# Patient Record
Sex: Female | Born: 1955
Health system: Southern US, Community
[De-identification: ages and names within clinical notes are randomized; demographics above are authoritative.]

## PROBLEM LIST (undated history)

## (undated) DIAGNOSIS — E119 Type 2 diabetes mellitus without complications: Secondary | ICD-10-CM

## (undated) DIAGNOSIS — F329 Major depressive disorder, single episode, unspecified: Secondary | ICD-10-CM

## (undated) DIAGNOSIS — I639 Cerebral infarction, unspecified: Secondary | ICD-10-CM

## (undated) DIAGNOSIS — Q249 Congenital malformation of heart, unspecified: Secondary | ICD-10-CM

## (undated) DIAGNOSIS — E7800 Pure hypercholesterolemia, unspecified: Secondary | ICD-10-CM

## (undated) DIAGNOSIS — F32A Depression, unspecified: Secondary | ICD-10-CM

## (undated) DIAGNOSIS — I10 Essential (primary) hypertension: Secondary | ICD-10-CM

## (undated) DIAGNOSIS — F410 Panic disorder [episodic paroxysmal anxiety] without agoraphobia: Secondary | ICD-10-CM

## (undated) HISTORY — DX: Congenital malformation of heart, unspecified: Q24.9

## (undated) HISTORY — PX: WISDOM TOOTH EXTRACTION: SHX21

## (undated) HISTORY — DX: Type 2 diabetes mellitus without complications: E11.9

## (undated) HISTORY — DX: Pure hypercholesterolemia, unspecified: E78.00

## (undated) HISTORY — PX: DILATION AND EVACUATION: SHX1459

---

## 2009-12-26 HISTORY — PX: CARDIAC SURGERY: SHX584

## 2012-04-09 ENCOUNTER — Emergency Department (HOSPITAL_COMMUNITY)
Admission: EM | Admit: 2012-04-09 | Discharge: 2012-04-09 | Disposition: A | Discharge status: Home / Self Care | Payer: Self-pay | Source: Home / Self Care | Attending: Emergency Medicine | Admitting: Emergency Medicine

## 2012-04-09 ENCOUNTER — Encounter (HOSPITAL_COMMUNITY): Payer: Self-pay | Admitting: Emergency Medicine

## 2012-04-09 DIAGNOSIS — F410 Panic disorder [episodic paroxysmal anxiety] without agoraphobia: Secondary | ICD-10-CM

## 2012-04-09 HISTORY — DX: Essential (primary) hypertension: I10

## 2012-04-09 HISTORY — DX: Major depressive disorder, single episode, unspecified: F32.9

## 2012-04-09 HISTORY — DX: Depression, unspecified: F32.A

## 2012-04-09 HISTORY — DX: Panic disorder (episodic paroxysmal anxiety): F41.0

## 2012-04-09 MED ORDER — HYDROXYZINE HCL 25 MG PO TABS: 25.0000 mg | ORAL_TABLET | Freq: Three times a day (TID) | ORAL | Status: AC | PRN

## 2012-04-09 NOTE — ED Provider Notes (Signed)
Chief Complaint  Patient presents with  . Medication Refill  . Panic Attack    History of Present Illness:  Renee Griffin is a 56 year old female with a long history of panic attacks. She was being treated by a Dr. in [place] with alprazolam 1 mg 3 times daily. Her last medication refill was March 23rd. She is out of her Xanax completely right now. She is requesting a refill. She is on multiple medications, she does not know the names of any of them. She has had valvular heart disease with a porcine valve replacement and high blood pressure. She denies any suicidal ideation or homicidal ideation. No depression or crying spells. She denies any shortness of breath, chest pain or tightness, racing of the heart, or paresthesias.  Review of Systems:  Other than noted above, the patient denies any of the following symptoms: Systemic:  No fever, chills, sweats, fatigue, weight loss or gain. Resp:  No shortness of breath. Cardiovasc:  No chest pain, tightness, pressure, palpitations, dizziness, or syncope. GI:  No abdominal pain, nausea, vomiting, anorexia, diarrhea, or constipation. Neuro:  No headache, paresthesias, tremor, or muscle weakness. Psych:  No sadness, depression, crying, anxiety, panic, sleep disturbance, or suicidal or homicidal ideation.  No hallucinations or delusions.  [place]:  Past medical history, family history, social history, meds, and allergies were reviewed.  Physical Exam:   Vital signs:  BP 196/96  Pulse 85  Temp(Src) 97.9 F (36.6 C) (Oral)  Resp 20  SpO2 100% Gen:  Alert, oriented, in no distress. Lungs:  No respiratory distress.  Breath sounds clear and equal bilaterally.  No wheezes, rales, or rhonchi. Heart:  Regular rthythm.  No gallops, murmers, clicks or rubs. Abdomen:  Soft, flat and nontender.  No organomegaly or mass. Neuro:  Alert and oriented times 3. Speech clear, fluent and appropriate.  Cranial nerves intact.  No focal weakness. Psych:  Mood and affect normal.   Speech pattern normal.  Thought content normal with no suicidal or homicidal ideation.  No paranoia, hallucinations, or delusions.  Memory, insight, and judgement normal.  Assessment:  The encounter diagnosis was Panic attacks. She is too early on her refills for her alprazolam. I did check the [facility] and there were no other records on her. I told her I could give her a hydroxyzine for symptomatic relief and she could come back in a week for a reevaluation. I urged her to find a primary care doctor or a psychiatrist as soon as possible to refill this medication as we would not continue to refill it here at the urgent care Center.  Plan:   1.  The following meds were prescribed:   New Prescriptions   HYDROXYZINE (ATARAX/VISTARIL) 25 MG TABLET    Take 1 tablet (25 mg total) by mouth every 8 (eight) hours as needed for itching.   2.  The patient was instructed in symptomatic care and handouts were given. 3.  The patient was told to return if becoming worse in any way, if no better in 3 or 4 days, and given some red flag symptoms that would indicate earlier return.    Harden Mo, MD 04/09/12 2230

## 2012-04-09 NOTE — Discharge Instructions (Signed)

## 2012-04-09 NOTE — ED Notes (Signed)
PT HERE FOR RX REFILL ALPRAZOLAM FOR HX DEPRESSION,PANIC ATTACK,ANXIETY.PT WAS PRESCRIBED OVER THE PHONE BY PCP IN [place] 90 TABLETS ON 03/16/12.PT UNSURE OF HOW PILLS ARE FINISHED?? VERY TEARFUL AND NOT FOCUSED WHEN ASKED.BP ELEVATED.PT STATES SHE TAKING X 3 DIFFERENT BP MEDS DAILY

## 2012-04-16 ENCOUNTER — Encounter (HOSPITAL_COMMUNITY): Payer: Self-pay

## 2012-04-16 ENCOUNTER — Emergency Department (INDEPENDENT_AMBULATORY_CARE_PROVIDER_SITE_OTHER)
Admission: EM | Admit: 2012-04-16 | Discharge: 2012-04-16 | Disposition: A | Discharge status: Home / Self Care | Payer: Self-pay | Source: Home / Self Care | Attending: Emergency Medicine | Admitting: Emergency Medicine

## 2012-04-16 DIAGNOSIS — F419 Anxiety disorder, unspecified: Secondary | ICD-10-CM

## 2012-04-16 DIAGNOSIS — I38 Endocarditis, valve unspecified: Secondary | ICD-10-CM

## 2012-04-16 DIAGNOSIS — F411 Generalized anxiety disorder: Secondary | ICD-10-CM

## 2012-04-16 MED ORDER — BUSPIRONE HCL 15 MG PO TABS: 15.0000 mg | ORAL_TABLET | Freq: Three times a day (TID) | ORAL | Status: DC

## 2012-04-16 MED ORDER — CLOPIDOGREL BISULFATE 75 MG PO TABS: 75.0000 mg | ORAL_TABLET | Freq: Every day | ORAL | Status: DC

## 2012-04-16 MED ORDER — ALPRAZOLAM 1 MG PO TABS: 1.0000 mg | ORAL_TABLET | Freq: Every evening | ORAL | Status: DC | PRN

## 2012-04-16 NOTE — Discharge Instructions (Signed)

## 2012-04-16 NOTE — ED Notes (Signed)
Needs refills for her nerves

## 2012-04-16 NOTE — ED Provider Notes (Signed)
Chief Complaint  Patient presents with  . Medication Refill    History of Present Illness:  Renee Griffin is a 56 year old female who was seen here on April 15. At that time she wanted a refill on her Xanax. She was being treated by a doctor in [place] with a basal 1 mg 3 times daily for anxiety. Her last medication refills on March 23, so refill is denied. She is on multiple medications, but she does not have the names of any of them. She has had no other heart disease with a porcine valve replacement and high blood pressure. She was sent home on hydroxyzine. She been able to do fairly well since then. She has an appointment with Dr. Mickey Farber at the end of May, but she feels she needs something to last until then. She denies depression, suicidal ideation, or homicidal ideation. No crying spells, shortness of breath, chest pain, tightness, racing of her heart, or paresthesias. She also wanted a refill on her Plavix which she takes because of her porcine valve.  Review of Systems:  Other than noted above, the patient denies any of the following symptoms: Systemic:  No fever, chills, sweats, fatigue, weight loss or gain. Resp:  No shortness of breath. Cardiovasc:  No chest pain, tightness, pressure, palpitations, dizziness, or syncope. GI:  No abdominal pain, nausea, vomiting, anorexia, diarrhea, or constipation. Neuro:  No headache, paresthesias, tremor, or muscle weakness. Psych:  No sadness, depression, crying, anxiety, panic, sleep disturbance, or suicidal or homicidal ideation.  No hallucinations or delusions.  [place]:  Past medical history, family history, social history, meds, and allergies were reviewed.  Physical Exam:   Vital signs:  BP 107/73  Pulse 92  Temp(Src) 97.4 F (36.3 C) (Oral)  Resp 17  SpO2 97% Gen:  Alert, oriented, in no distress. Lungs:  No respiratory distress.  Breath sounds clear and equal bilaterally.  No wheezes, rales, or rhonchi. Heart:  Regular rthythm.  No gallops,  murmers, clicks or rubs. Abdomen:  Soft, flat and nontender.  No organomegaly or mass. Neuro:  Alert and oriented times 3. Speech clear, fluent and appropriate.  Cranial nerves intact.  No focal weakness. Psych:  Mood and affect normal.  Speech pattern normal.  Thought content normal with no suicidal or homicidal ideation.  No paranoia, hallucinations, or delusions.  Memory, insight, and judgement normal.  Assessment:  The primary encounter diagnosis was Anxiety. A diagnosis of Valvular heart disease was also pertinent to this visit.  I explained to her that we could not refill her Xanax on a regular basis, but I would give her a small prescription for just a few. She was given a refill on her Plavix, about to last until she can see Dr. Ernie Hew.  Plan:   1.  The following meds were prescribed:   New Prescriptions   ALPRAZOLAM (XANAX) 1 MG TABLET    Take 1 tablet (1 mg total) by mouth at bedtime as needed for sleep.   BUSPIRONE (BUSPAR) 15 MG TABLET    Take 1 tablet (15 mg total) by mouth 3 (three) times daily.   CLOPIDOGREL (PLAVIX) 75 MG TABLET    Take 1 tablet (75 mg total) by mouth daily.   2.  The patient was instructed in symptomatic care and handouts were given. 3.  The patient was told to return if becoming worse in any way, if no better in 3 or 4 days, and given some red flag symptoms that would indicate earlier return.  Harden Mo, MD 04/16/12 [phone]

## 2012-04-16 NOTE — ED Notes (Signed)
Unable to locate pt previously, but has now returned to building

## 2012-06-03 ENCOUNTER — Inpatient Hospital Stay (HOSPITAL_COMMUNITY)
Admission: EM | Admit: 2012-06-03 | Discharge: 2012-06-04 | DRG: 066 | Disposition: A | Discharge status: Home / Self Care | Payer: Medicaid Other | Attending: Internal Medicine | Admitting: Internal Medicine

## 2012-06-03 ENCOUNTER — Inpatient Hospital Stay (HOSPITAL_COMMUNITY): Payer: Medicaid Other

## 2012-06-03 ENCOUNTER — Emergency Department (HOSPITAL_COMMUNITY): Payer: Medicaid Other

## 2012-06-03 ENCOUNTER — Encounter (HOSPITAL_COMMUNITY): Payer: Self-pay | Admitting: Emergency Medicine

## 2012-06-03 DIAGNOSIS — F3289 Other specified depressive episodes: Secondary | ICD-10-CM | POA: Diagnosis present

## 2012-06-03 DIAGNOSIS — R209 Unspecified disturbances of skin sensation: Secondary | ICD-10-CM

## 2012-06-03 DIAGNOSIS — I10 Essential (primary) hypertension: Secondary | ICD-10-CM

## 2012-06-03 DIAGNOSIS — Z951 Presence of aortocoronary bypass graft: Secondary | ICD-10-CM

## 2012-06-03 DIAGNOSIS — Z79899 Other long term (current) drug therapy: Secondary | ICD-10-CM

## 2012-06-03 DIAGNOSIS — Z952 Presence of prosthetic heart valve: Secondary | ICD-10-CM

## 2012-06-03 DIAGNOSIS — F419 Anxiety disorder, unspecified: Secondary | ICD-10-CM | POA: Diagnosis present

## 2012-06-03 DIAGNOSIS — I639 Cerebral infarction, unspecified: Secondary | ICD-10-CM

## 2012-06-03 DIAGNOSIS — I6529 Occlusion and stenosis of unspecified carotid artery: Secondary | ICD-10-CM | POA: Diagnosis present

## 2012-06-03 DIAGNOSIS — F329 Major depressive disorder, single episode, unspecified: Secondary | ICD-10-CM | POA: Diagnosis present

## 2012-06-03 DIAGNOSIS — I6350 Cerebral infarction due to unspecified occlusion or stenosis of unspecified cerebral artery: Principal | ICD-10-CM | POA: Diagnosis present

## 2012-06-03 DIAGNOSIS — I6340 Cerebral infarction due to embolism of unspecified cerebral artery: Secondary | ICD-10-CM

## 2012-06-03 DIAGNOSIS — F411 Generalized anxiety disorder: Secondary | ICD-10-CM

## 2012-06-03 DIAGNOSIS — E782 Mixed hyperlipidemia: Secondary | ICD-10-CM

## 2012-06-03 DIAGNOSIS — Z7982 Long term (current) use of aspirin: Secondary | ICD-10-CM

## 2012-06-03 DIAGNOSIS — F17200 Nicotine dependence, unspecified, uncomplicated: Secondary | ICD-10-CM | POA: Diagnosis present

## 2012-06-03 DIAGNOSIS — G459 Transient cerebral ischemic attack, unspecified: Secondary | ICD-10-CM

## 2012-06-03 DIAGNOSIS — E785 Hyperlipidemia, unspecified: Secondary | ICD-10-CM | POA: Diagnosis present

## 2012-06-03 DIAGNOSIS — Z8673 Personal history of transient ischemic attack (TIA), and cerebral infarction without residual deficits: Secondary | ICD-10-CM

## 2012-06-03 HISTORY — DX: Cerebral infarction, unspecified: I63.9

## 2012-06-03 LAB — BASIC METABOLIC PANEL
BUN: 19 mg/dL (ref 6–23)
CO2: 26 mEq/L (ref 19–32)
Calcium: 10.8 mg/dL — ABNORMAL HIGH (ref 8.4–10.5)
Glucose, Bld: 111 mg/dL — ABNORMAL HIGH (ref 70–99)
Sodium: 140 mEq/L (ref 135–145)

## 2012-06-03 LAB — CBC
HCT: 43 % (ref 36.0–46.0)
Hemoglobin: 14.3 g/dL (ref 12.0–15.0)
MCH: 29.5 pg (ref 26.0–34.0)
MCHC: 33.3 g/dL (ref 30.0–36.0)
RBC: 4.84 MIL/uL (ref 3.87–5.11)

## 2012-06-03 MED ORDER — LORAZEPAM 2 MG/ML IJ SOLN
2.0000 mg | Freq: Once | INTRAMUSCULAR | Status: AC
  Administered 2012-06-03: 2 mg via INTRAVENOUS
  Filled 2012-06-03: qty 1

## 2012-06-03 MED ORDER — CLOPIDOGREL BISULFATE 75 MG PO TABS
75.0000 mg | ORAL_TABLET | Freq: Every day | ORAL | Status: DC
  Administered 2012-06-03 – 2012-06-04 (×2): 75 mg via ORAL
  Filled 2012-06-03 (×4): qty 1

## 2012-06-03 MED ORDER — BUSPIRONE HCL 15 MG PO TABS
15.0000 mg | ORAL_TABLET | Freq: Three times a day (TID) | ORAL | Status: DC
  Filled 2012-06-03 (×4): qty 1

## 2012-06-03 MED ORDER — ALPRAZOLAM 1 MG PO TABS
1.0000 mg | ORAL_TABLET | Freq: Three times a day (TID) | ORAL | Status: DC
  Administered 2012-06-03 – 2012-06-04 (×3): 1 mg via ORAL
  Filled 2012-06-03 (×3): qty 1

## 2012-06-03 MED ORDER — LISINOPRIL 20 MG PO TABS
20.0000 mg | ORAL_TABLET | Freq: Every day | ORAL | Status: DC
  Administered 2012-06-03 – 2012-06-04 (×2): 20 mg via ORAL
  Filled 2012-06-03 (×2): qty 1

## 2012-06-03 MED ORDER — ENOXAPARIN SODIUM 40 MG/0.4ML ~~LOC~~ SOLN
40.0000 mg | SUBCUTANEOUS | Status: DC
  Administered 2012-06-03: 40 mg via SUBCUTANEOUS
  Filled 2012-06-03 (×2): qty 0.4

## 2012-06-03 MED ORDER — SENNOSIDES-DOCUSATE SODIUM 8.6-50 MG PO TABS
1.0000 | ORAL_TABLET | Freq: Every evening | ORAL | Status: DC | PRN
  Filled 2012-06-03: qty 1

## 2012-06-03 MED ORDER — HYDROCHLOROTHIAZIDE 25 MG PO TABS
25.0000 mg | ORAL_TABLET | Freq: Every day | ORAL | Status: DC
  Administered 2012-06-03 – 2012-06-04 (×2): 25 mg via ORAL
  Filled 2012-06-03 (×2): qty 1

## 2012-06-03 MED ORDER — ASPIRIN 325 MG PO TABS
325.0000 mg | ORAL_TABLET | Freq: Once | ORAL | Status: AC
  Administered 2012-06-03: 325 mg via ORAL
  Filled 2012-06-03: qty 1

## 2012-06-03 MED ORDER — ASPIRIN 81 MG PO TABS: 81.0000 mg | ORAL_TABLET | Freq: Every day | ORAL | Status: DC

## 2012-06-03 MED ORDER — ASPIRIN EC 81 MG PO TBEC
81.0000 mg | DELAYED_RELEASE_TABLET | Freq: Every day | ORAL | Status: DC
  Filled 2012-06-03: qty 1

## 2012-06-03 MED ORDER — PNEUMOCOCCAL VAC POLYVALENT 25 MCG/0.5ML IJ INJ
0.5000 mL | INJECTION | INTRAMUSCULAR | Status: AC
  Administered 2012-06-04: 0.5 mL via INTRAMUSCULAR
  Filled 2012-06-03: qty 0.5

## 2012-06-03 MED ORDER — LISINOPRIL 20 MG PO TABS: 20.0000 mg | ORAL_TABLET | Freq: Every day | ORAL | Status: DC

## 2012-06-03 MED ORDER — MORPHINE SULFATE 2 MG/ML IJ SOLN
2.0000 mg | Freq: Once | INTRAMUSCULAR | Status: AC
  Administered 2012-06-03: 2 mg via INTRAVENOUS
  Filled 2012-06-03: qty 1

## 2012-06-03 MED ORDER — METOPROLOL TARTRATE 25 MG PO TABS
25.0000 mg | ORAL_TABLET | Freq: Two times a day (BID) | ORAL | Status: DC
  Administered 2012-06-03 – 2012-06-04 (×3): 25 mg via ORAL
  Filled 2012-06-03 (×4): qty 1

## 2012-06-03 MED ORDER — CLONIDINE HCL 0.3 MG PO TABS
0.3000 mg | ORAL_TABLET | Freq: Three times a day (TID) | ORAL | Status: DC
Start: 2012-06-03 — End: 2012-06-04
  Administered 2012-06-03 – 2012-06-04 (×3): 0.3 mg via ORAL
  Filled 2012-06-03 (×5): qty 1

## 2012-06-03 MED ORDER — ACETAMINOPHEN 650 MG RE SUPP: 650.0000 mg | RECTAL | Status: DC | PRN

## 2012-06-03 MED ORDER — LISINOPRIL-HYDROCHLOROTHIAZIDE 20-25 MG PO TABS
1.0000 | ORAL_TABLET | Freq: Every day | ORAL | Status: DC
Start: 2012-06-03 — End: 2012-06-03

## 2012-06-03 MED ORDER — ALPRAZOLAM 0.5 MG PO TABS
1.0000 mg | ORAL_TABLET | Freq: Once | ORAL | Status: AC
  Administered 2012-06-03: 1 mg via ORAL
  Filled 2012-06-03: qty 2

## 2012-06-03 MED ORDER — ONDANSETRON HCL 4 MG/2ML IJ SOLN: 4.0000 mg | Freq: Four times a day (QID) | INTRAMUSCULAR | Status: DC | PRN

## 2012-06-03 MED ORDER — METOPROLOL TARTRATE 25 MG PO TABS: 25.0000 mg | ORAL_TABLET | Freq: Two times a day (BID) | ORAL | Status: DC

## 2012-06-03 MED ORDER — ASPIRIN EC 81 MG PO TBEC
81.0000 mg | DELAYED_RELEASE_TABLET | Freq: Every day | ORAL | Status: DC
  Administered 2012-06-04: 81 mg via ORAL
  Filled 2012-06-03: qty 1

## 2012-06-03 MED ORDER — POTASSIUM CHLORIDE CRYS ER 20 MEQ PO TBCR
20.0000 meq | EXTENDED_RELEASE_TABLET | Freq: Once | ORAL | Status: AC
  Administered 2012-06-03: 20 meq via ORAL
  Filled 2012-06-03: qty 1

## 2012-06-03 MED ORDER — IOHEXOL 300 MG/ML  SOLN
100.0000 mL | Freq: Once | INTRAMUSCULAR | Status: AC | PRN
  Administered 2012-06-03: 100 mL via INTRAVENOUS

## 2012-06-03 MED ORDER — MORPHINE SULFATE 4 MG/ML IJ SOLN
4.0000 mg | Freq: Once | INTRAMUSCULAR | Status: AC
  Administered 2012-06-03: 4 mg via INTRAVENOUS
  Filled 2012-06-03: qty 1

## 2012-06-03 MED ORDER — ALPRAZOLAM 1 MG PO TABS: 1.0000 mg | ORAL_TABLET | Freq: Two times a day (BID) | ORAL | Status: DC | PRN

## 2012-06-03 MED ORDER — SIMVASTATIN 20 MG PO TABS
20.0000 mg | ORAL_TABLET | Freq: Every day | ORAL | Status: DC
  Administered 2012-06-03: 20 mg via ORAL
  Filled 2012-06-03 (×2): qty 1

## 2012-06-03 MED ORDER — ACETAMINOPHEN 325 MG PO TABS: 650.0000 mg | ORAL_TABLET | ORAL | Status: DC | PRN

## 2012-06-03 NOTE — ED Notes (Signed)
Pt at MRI

## 2012-06-03 NOTE — ED Notes (Signed)
Pt asking for food. She has passed bedside swallow screen. ED MD notified and cleared pt for food.

## 2012-06-03 NOTE — Progress Notes (Signed)
VASCULAR LAB PRELIMINARY  PRELIMINARY  PRELIMINARY  PRELIMINARY  Carotid Dopplers completed.    Preliminary report:  No ICA stenosis.  Vertebral artery flow is antegrade.  Sharion Dove [place], 06/03/2012, 8:00 PM

## 2012-06-03 NOTE — ED Notes (Signed)
Rn [place] notified about pt request. Pt stated that she will talk to pt

## 2012-06-03 NOTE — ED Notes (Signed)
Report called to Baker Janus, RN.

## 2012-06-03 NOTE — ED Notes (Signed)
Bedside swallow assessed. Pt passed. She swallowed thin liquids with no signs of aspiration.

## 2012-06-03 NOTE — ED Notes (Signed)
Patient extremely anxious, requesting xanax on arrival, ripped blood pressure cuff off arm, stating she "can't handle it" and to "stop with that thing on my arm". Offered to take blood pressure again, patient states staff will not "put that thing back on my arm." Patient sts she has tingling in her left arm, full use of arm, attempting to use cell phone with left arm and hand.

## 2012-06-03 NOTE — ED Provider Notes (Signed)
Patient was turned over to me pending MRI results.  Patient had left finger numbness and left toe numbness yesterday.  She states she still has some of this on my reevaluation this morning.  Patient is otherwise not developed any other new neurologic deficits such as weakness, facial droop percent speech.  Patient's MRI results do show acute infarction in her left cerebellar cortex and left pons.  This does not correlate with these symptoms the patient has been evaluated for.  I did discuss this with Dr. Doy Mince the neural hospitalist and she still definitively recommends the patient be admitted to evaluate for causes of stroke so the patient with known history of strokes who at this time does not have further primary care neurology care in the area since she just moved here in January.  Discussed with triad for admission.  Lezlie Octave, MD 06/03/12 1242

## 2012-06-03 NOTE — ED Notes (Signed)
Pt asking, "to go outside for a minute." After questioning, she confides that she wants to smoke. She is advised of the nosmoking policy and offered a nicotine patch to which she replied she is allergic. She states that she needs to be able to go out at least once a day to smoke. Advised that this will not be permissible. Pt blatantly stated that she, "will walk out and do it without telling anyone." Advised her this was not in her best interest and asked her to allow me to talk with admitting MD concerning the matter. Discussed this with admitting MD who states that she cannot do this, and further states that he had the same conversation with her. He states that if she goes out, she will be required to sign AMA. This was communicated to patient and she said she will consider it.

## 2012-06-03 NOTE — H&P (Signed)
PCP:   No primary provider on file.  Primary MD:  Dr Santa Lighter II, [facility], [place], [place]. Tel: [phone]. Primary Cardiologist: Dr Sunnie Nielsen, [place], [place], [place].  Chief Complaint:  Left-sided numbness and tingling in fingers and toes.  HPI: This is a 55 year old female, with known history of HTN, Depression, Anxiety/Panic attacks, dyslipidemia, 3 previous CVAs, s/p cardiac surgery, with porcine aortic valve placement, due to congenital defect, per patient, smoking, presenting with above symptoms, which commenced at about 1:30 PM  on 06/02/12, while patient was lying in bed, trying to get comfortable, as she had been nursing left shoulder pain, which had troubled her for one week, without antecedent trauma. Patient had no visual obscurations, speech difficulties or weakness of extremities. She activated her life-alert system, and was brought by EMS to ED Patient's NIH score was 1, at presentation.   Allergies:  No Known Allergies    Past Medical History  Diagnosis Date  . Hypertension   . Depression   . Panic attack   . Stroke     Past Surgical History  Procedure Date  . Cardiac surgery 2011    CABG    Prior to Admission medications   Medication Sig Start Date End Date Taking? Authorizing Provider  aspirin 81 MG tablet Take 81 mg by mouth daily.   Yes Historical Provider, MD  busPIRone (BUSPAR) 15 MG tablet Take 1 tablet (15 mg total) by mouth 3 (three) times daily. 04/16/12 04/16/13 Yes Harden Mo, MD  cloNIDine (CATAPRES) 0.3 MG tablet Take 0.3 mg by mouth 3 (three) times daily.   Yes Historical Provider, MD  clopidogrel (PLAVIX) 75 MG tablet Take 1 tablet (75 mg total) by mouth daily. 04/16/12 04/16/13 Yes Harden Mo, MD  hydrochlorothiazide (HYDRODIURIL) 25 MG tablet Take 25 mg by mouth daily.   Yes Historical Provider, MD  lisinopril-hydrochlorothiazide (PRINZIDE,ZESTORETIC) 20-25 MG per tablet Take 1 tablet by mouth daily.   Yes Historical  Provider, MD  metoprolol tartrate (LOPRESSOR) 25 MG tablet Take 25 mg by mouth 2 (two) times daily.   Yes Historical Provider, MD  pravastatin (PRAVACHOL) 40 MG tablet Take 40 mg by mouth daily.   Yes Historical Provider, MD  ALPRAZolam Duanne Moron) 1 MG tablet Take 1 mg by mouth at bedtime as needed. For sleep    Historical Provider, MD  ALPRAZolam (XANAX) 1 MG tablet Take 1 tablet (1 mg total) by mouth 2 (two) times daily as needed for sleep. 06/03/12 07/03/12  Hoy Morn, MD  clopidogrel (PLAVIX) 75 MG tablet Take 75 mg by mouth daily.    Historical Provider, MD  lisinopril (PRINIVIL,ZESTRIL) 20 MG tablet Take 1 tablet (20 mg total) by mouth daily. 06/03/12 06/03/13  Hoy Morn, MD  metoprolol tartrate (LOPRESSOR) 25 MG tablet Take 1 tablet (25 mg total) by mouth 2 (two) times daily. 06/03/12 06/03/13  Hoy Morn, MD    Social History: Patient lives alone. She moved to [place], from [place] in January 2013. She reports that she has been smoking.  She does not have any smokeless tobacco history on file. She reports that she does not drink alcohol or use illicit drugs.  No family history on file.  Review of Systems:  As per HPI and chief complaint. Patent denies fatigue, diminished appetite, weight loss, fever, chills, headache, blurred vision, difficulty in speaking, dysphagia, chest pain, cough, shortness of breath, orthopnea, paroxysmal nocturnal dyspnea, nausea, diaphoresis, abdominal pain, vomiting, diarrhea, belching, heartburn, hematemesis, melena,  dysuria, nocturia, urinary frequency, hematochezia, lower extremity swelling, pain, or redness. She has chronic nasal congestion. The rest of the systems review is negative.  Physical Exam:  General:  Patient does not appear to be in obvious acute distress. Alert, communicative, fully oriented, talking in complete sentences, not short of breath at rest, emotionally labile easily breaking into tears, appears quite forgetful. HEENT:  No clinical  pallor, no jaundice, no conjunctival injection or discharge. Hydration status is fair. NECK:  Supple, JVP not seen, no carotid bruits, no palpable lymphadenopathy, no palpable goiter. CHEST:  Has median sternotomy scar. Clinically clear to auscultation, no wheezes, no crackles. HEART:  Sounds 1 and 2 heard, normal, regular, no murmurs. ABDOMEN:  Full, soft, non-tender, no palpable organomegaly, no palpable masses, normal bowel sounds. GENITALIA:  Not examined. LOWER EXTREMITIES:  No pitting edema, palpable peripheral pulses. MUSCULOSKELETAL SYSTEM:  Generalized osteoarthritic changes, otherwise, normal. CENTRAL NERVOUS SYSTEM:  No focal neurologic deficit on gross examination.  Labs on Admission:  Results for orders placed during the hospital encounter of 06/03/12 (from the past 48 hour(s))  CBC     Status: Normal   Collection Time   06/03/12  5:05 AM      Component Value Range Comment   WBC 9.8  4.0 - 10.5 (K/uL)    RBC 4.84  3.87 - 5.11 (MIL/uL)    Hemoglobin 14.3  12.0 - 15.0 (g/dL)    HCT 43.0  36.0 - 46.0 (%)    MCV 88.8  78.0 - 100.0 (fL)    MCH 29.5  26.0 - 34.0 (pg)    MCHC 33.3  30.0 - 36.0 (g/dL)    RDW 14.6  11.5 - 15.5 (%)    Platelets 335  150 - 400 (K/uL)   BASIC METABOLIC PANEL     Status: Abnormal   Collection Time   06/03/12  5:05 AM      Component Value Range Comment   Sodium 140  135 - 145 (mEq/L)    Potassium 3.0 (*) 3.5 - 5.1 (mEq/L)    Chloride 102  96 - 112 (mEq/L)    CO2 26  19 - 32 (mEq/L)    Glucose, Bld 111 (*) 70 - 99 (mg/dL)    BUN 19  6 - 23 (mg/dL)    Creatinine, Ser 1.00  0.50 - 1.10 (mg/dL)    Calcium 10.8 (*) 8.4 - 10.5 (mg/dL)    GFR calc non Af Amer 62 (*) >90 (mL/min)    GFR calc Af Amer 72 (*) >90 (mL/min)     Radiological Exams on Admission: *RADIOLOGY REPORT*  Clinical Data: Weakness, left arm tingling  CT HEAD WITHOUT CONTRAST  Technique: Contiguous axial images were obtained from the base of  the skull through the vertex without  contrast.  Comparison: None.  Findings: Prominence of the sulci, cisterns, and ventricles, in  keeping with volume loss. There are subcortical and periventricular  white matter hypodensities, a nonspecific finding most often seen  with chronic microangiopathic changes. Hypodensities within the  bilateral basal ganglia may reflect age indeterminate lacunar  infarctions.  There is no evidence for acute hemorrhage, overt hydrocephalus,  mass lesion, or abnormal extra-axial fluid collection. No definite  CT evidence for acute cortical based (large artery) infarction. The  visualized paranasal sinuses and mastoid air cells are  predominately clear.  IMPRESSION:  Volume loss and white matter changes as above. Age indeterminate  lacunar infarctions within the basal ganglia. Otherwise, no  definite acute intracranial abnormality.  Original Report Authenticated By: Suanne Marker, M.D.  *RADIOLOGY REPORT*  Clinical Data: Elevated blood pressure. Tinging left hand and leg.  MRI HEAD WITHOUT CONTRAST  Technique: Multiplanar, multiecho pulse sequences of the brain and  surrounding structures were obtained according to standard protocol  without intravenous contrast.  Comparison: CT head earlier in the day.  Findings: There are subcentimeter areas of restricted diffusion (  series 3) noted in the left cerebellar cortex(image 6) and left  paramedian lower pons (image 8) concerning for acute infarction.  There are no foci of acute hemorrhage associated with these two  lesions, but there are multiple foci of chronic microbleeds  throughout the brain, likely a manifestation of hypertensive  cerebrovascular disease (multiple arrows, series 10).  Premature atrophy is present for the patient's age of 32. There is  chronic microvascular ischemic change in the periventricular and  subcortical white matter. No large vessel infarcts are seen.  Bilateral thalamic and basal ganglia lacunar infarcts are  seen.  Remote periventricular white matter lacunar infarcts are noted.  The major intracranial vascular structures are patent based on flow  void signal. Empty sella is present. Mild sclerosis of the clivus  is of uncertain significance. There is no acute sinus or mastoid  disease.  IMPRESSION:  Subcentimeter areas of restricted diffusion left cerebellar cortex  and left paramedian lower pons consistent with acute infarction.  Atrophy, chronic microvascular ischemic change, numerous lacunes,  and scattered microbleeds, all consistent with advanced  hypertensive cerebrovascular disease.  Original Report Authenticated By: Staci Righter, M.D.   Assessment/Plan Principal Problem:  *CVA (cerebral infarction): Patient presented with paresthesiae of left hand and foot, but no objective neurologic deficit. Head CT scan was devoid of acute findings. Given NIH score of , t-PA was not considered an option. Follow up brain MRI revealed subcentimeter areas of restricted diffusion left cerebellar cortex and left paramedian lower pons consistent with acute infarction. In addition, there was atrophy, chronic microvascular ischemic change, numerous lacunes, and scattered microbleeds, all consistent with advanced hypertensive cerebrovascular disease. She has recurrent acute CVA, although her presenting symptoms are in the wrong anatomic distribution, and may be attributable to anxiety. Patient will be admitted for full CVA work up. She is already on maximal anti-platelet medication, with ASA and Plavix, which we shall continue. Further risk factor modification will be pursued. Neurology consultation has been requested.  Active Problems:  1. HTN (hypertension): Patient's blood pressure is uncontrolled at this time, and was as high as 234/141 at presentation, although now far more reasonable at 155/85. Pre-admission, she was on Lisinopril-HCTZ, HCTZ and Lopressor, but she ran out of her these, a month ago. We shall  reinstate this.   2. Dyslipidemia: Patient is on statin, which we shall continue. We shall check lipid panel.  3. Anxiey/Panic attacks: Patient appears very anxious, and emotionally labile. We shall manage with anxiolytics.  4. Tobacco Abuse: Patient smokes a pack of cigarettes per day, and has done so for over 40 years. She emphatically assures me that she has no desire to quit. I offered a Nicoderm patch, but patient states that she is allergic to this. I did remind her that per facility policy, she will not be permitted to go outside and smoke.   Further management will depend on clinical course.   Comment: Patient moved to [place], from [place] in January 2013, and has not yet estabished a PMD. She has an appointment scheduled with a new MD for the end of June  2013, but states that she has no insurance. Will request care manager to look into patient's situation and options.   Time Spent on Admission: 1 hour.  Luisa Louk,CHRISTOPHER 06/03/2012, 1:17 PM

## 2012-06-03 NOTE — ED Notes (Signed)
Pt transported from home by EMS after experiencing tingling to L arm. Neuro intact, neg stroke screen. Pt appears very anxious on arrival. States she is out of her Xanax.

## 2012-06-03 NOTE — Progress Notes (Signed)
States difficulty obtaining cardiac/ BP meds.  Takes Plavix because she feels this is important, but foregoes other meds as she cannot afford them. Denies tingling or numbness in extrem's at this point.  Some residual weakness left side from old CVA, per statement. Verb. Understanding to call for assist. When getting up.

## 2012-06-03 NOTE — ED Notes (Signed)
MD at bedside. 

## 2012-06-03 NOTE — ED Notes (Signed)
Attempted to get another blood pressure. -- Pt. stated, "I just want xanax. My prescription ran out and I need more. I will not do anything until you get me my medications!" Pt. very irritated and uncooperative. RN Ria Comment notified.

## 2012-06-03 NOTE — ED Provider Notes (Addendum)
History     CSN: [id number]  Arrival date & time 06/03/12  0245   First MD Initiated Contact with Patient 06/03/12 [phone]      Chief Complaint  Patient presents with  . Weakness     The history is provided by the patient.   patient reports developing numbness and tingling of her left hand and left foot.  She denies weakness of her left upper lower extremity.  She reports she is extremely anxious and currently out of her Xanax.  She's had several strokes before in the past.  She has no residual deficits from this.  She reports her symptoms began approximately 1:30.  She's had no difficulty with her speech.  Family reports baseline mental status at this time.  Denies fevers or chills.  Patient has had no chest pain shortness of breath or vomiting.  She reports she is also currently out of her Cipro and metoprolol.  She is currently taking her Plavix.  She has a history of valve replacement.   Past Medical History  Diagnosis Date  . Hypertension   . Depression   . Panic attack   . Stroke     Past Surgical History  Procedure Date  . Cardiac surgery 2011    CABG    No family history on file.  History  Substance Use Topics  . Smoking status: Current Everyday Smoker  . Smokeless tobacco: Not on file  . Alcohol Use: No    OB History    Grav Para Term Preterm Abortions TAB SAB Ect Mult Living                  Review of Systems  Neurological: Positive for weakness.  All other systems reviewed and are negative.    Allergies  Review of patient's allergies indicates no known allergies.  Home Medications   Current Outpatient Rx  Name Route Sig Dispense Refill  . ASPIRIN 81 MG PO TABS Oral Take 81 mg by mouth daily.    . BUSPIRONE HCL 15 MG PO TABS Oral Take 1 tablet (15 mg total) by mouth 3 (three) times daily. 90 tablet 1  . CLONIDINE HCL 0.3 MG PO TABS Oral Take 0.3 mg by mouth 3 (three) times daily.    Marland Kitchen CLOPIDOGREL BISULFATE 75 MG PO TABS Oral Take 1 tablet (75 mg  total) by mouth daily. 30 tablet 1  . HYDROCHLOROTHIAZIDE 25 MG PO TABS Oral Take 25 mg by mouth daily.    Marland Kitchen LISINOPRIL-HYDROCHLOROTHIAZIDE 20-25 MG PO TABS Oral Take 1 tablet by mouth daily.    Marland Kitchen METOPROLOL TARTRATE 25 MG PO TABS Oral Take 25 mg by mouth 2 (two) times daily.    Marland Kitchen PRAVASTATIN SODIUM 40 MG PO TABS Oral Take 40 mg by mouth daily.    Marland Kitchen ALPRAZOLAM 1 MG PO TABS Oral Take 1 mg by mouth at bedtime as needed. For sleep    . CLOPIDOGREL BISULFATE 75 MG PO TABS Oral Take 75 mg by mouth daily.      BP 173/99  Pulse 69  Temp(Src) 98.1 F (36.7 C) (Oral)  Resp 17  Ht 5\' 4"  (1.626 m)  SpO2 94%  Physical Exam  Nursing note and vitals reviewed. Constitutional: She is oriented to person, place, and time. She appears well-developed and well-nourished. No distress.  HENT:  Head: Normocephalic and atraumatic.  Eyes: EOM are normal. Pupils are equal, round, and reactive to light.  Neck: Normal range of motion.  Cardiovascular: Normal rate,  regular rhythm and normal heart sounds.   Pulmonary/Chest: Effort normal and breath sounds normal.  Abdominal: Soft. She exhibits no distension. There is no tenderness.  Musculoskeletal: Normal range of motion.  Neurological: She is alert and oriented to person, place, and time.       5/5 strength in major muscle groups of  bilateral upper and lower extremities. Speech normal. No facial asymetry.   Skin: Skin is warm and dry.  Psychiatric: She has a normal mood and affect. Judgment normal.    ED Course  Procedures (including critical care time)  Labs Reviewed  BASIC METABOLIC PANEL - Abnormal; Notable for the following:    Potassium 3.0 (*)    Glucose, Bld 111 (*)    Calcium 10.8 (*)    GFR calc non Af Amer 62 (*)    GFR calc Af Amer 72 (*)    All other components within normal limits  CBC   Ct Head Wo Contrast  06/03/2012  *RADIOLOGY REPORT*  Clinical Data: Weakness, left arm tingling  CT HEAD WITHOUT CONTRAST  Technique:  Contiguous  axial images were obtained from the base of the skull through the vertex without contrast.  Comparison: None.  Findings: Prominence of the sulci, cisterns, and ventricles, in keeping with volume loss. There are subcortical and periventricular white matter hypodensities, a nonspecific finding most often seen with chronic microangiopathic changes. Hypodensities within the bilateral basal ganglia may reflect age indeterminate lacunar infarctions.  There is no evidence for acute hemorrhage, overt hydrocephalus, mass lesion, or abnormal extra-axial fluid collection.  No definite CT evidence for acute cortical based (large artery) infarction. The visualized paranasal sinuses and mastoid air cells are predominately clear.  IMPRESSION: Volume loss and white matter changes as above. Age indeterminate lacunar infarctions within the basal ganglia.  Otherwise, no definite acute intracranial abnormality.  Original Report Authenticated By: Suanne Marker, M.D.   I personally reviewed his CT scan  No diagnosis found.    MDM  Given the patient's ongoing symptoms an MRI scan will be obtained to define if this truly is a stroke.  My suspicion is low at this time.  Given the patient's onset of symptoms I considered the usage of TPA however because her NIH stroke scale was a 1 did not think she was a candidate for as I believe the risk would outweigh the benefit.  The patient's anxiety was treated with 2 mg of IV Ativan.  I will continue monitor the patient emergency department  8:20 AM Care to Dr Thad Ranger to follow up on [place], MD 06/03/12 0820   Date: 07/04/2012  Rate: 68  Rhythm: normal sinus rhythm  QRS Axis: normal  Intervals: normal  ST/T Wave abnormalities: normal  Conduction Disutrbances: none  Narrative Interpretation:   Old EKG Reviewed: no prior ecg available    Hoy Morn, MD 07/04/12 [phone]

## 2012-06-03 NOTE — ED Notes (Signed)
Pt ear bar replaced. Pt resting quietly at this time

## 2012-06-03 NOTE — ED Notes (Signed)
Patient allowed this RN to obtain blood pressure. After blood pressure was obtained patient stated she needed something for her nerves and she thinks her blood pressure will go down. This RN explained to her that without an MD prescription, I can not give her any medication. Patient then took blood pressure cuff off her arm and sts that she "will not wear this thing until I get something for my nerves."

## 2012-06-03 NOTE — Consult Note (Signed)
Referring Physician: Oti    Chief Complaint: Left finger and toes numbness  HPI: Renee Griffin is an 56 y.o. female reports that on yesterday began to have tingling in her fingertips on the left.  Then noted numbness in the toes on the left.  When symptoms did not resolve EMS was called and patient was brought in for evaluation.  Initial head CT was unremarkable. MRI of the brain was performed that showed subcentimeter areas of acute infarction in the left cerebellar cortex and left pons.   Patient reports she has had a stroke in the past (2009).  Att hat time deficits were on her left and at times she reports she still has some problems with her left.  She uses a large based quad cane for ambulation.    LSN: 06/02/2012 tPA Given: No: Outside time window  Past Medical History  Diagnosis Date  . Hypertension   . Depression   . Panic attack   . Stroke     Past Surgical History  Procedure Date  . Cardiac surgery 2011    CABG    - Porcine valve placement  No family history on file. Social History:  reports that she has been smoking Cigarettes.  She has a 50 pack-year smoking history. She does not have any smokeless tobacco history on file. She reports that she does not drink alcohol or use illicit drugs.  Allergies: No Known Allergies  Medications:  I have reviewed the patient's current medications. Prior to Admission:  Prescriptions prior to admission  Medication Sig Dispense Refill  . aspirin 81 MG tablet Take 81 mg by mouth daily.      . busPIRone (BUSPAR) 15 MG tablet Take 1 tablet (15 mg total) by mouth 3 (three) times daily.  90 tablet  1  . cloNIDine (CATAPRES) 0.3 MG tablet Take 0.3 mg by mouth 3 (three) times daily.      . clopidogrel (PLAVIX) 75 MG tablet Take 1 tablet (75 mg total) by mouth daily.  30 tablet  1  . hydrochlorothiazide (HYDRODIURIL) 25 MG tablet Take 25 mg by mouth daily.      Marland Kitchen lisinopril-hydrochlorothiazide (PRINZIDE,ZESTORETIC) 20-25 MG per tablet Take 1  tablet by mouth daily.      . metoprolol tartrate (LOPRESSOR) 25 MG tablet Take 25 mg by mouth 2 (two) times daily.      . pravastatin (PRAVACHOL) 40 MG tablet Take 40 mg by mouth daily.      Marland Kitchen ALPRAZolam (XANAX) 1 MG tablet Take 1 mg by mouth at bedtime as needed. For sleep      . clopidogrel (PLAVIX) 75 MG tablet Take 75 mg by mouth daily.       Scheduled:   . ALPRAZolam  1 mg Oral TID  . ALPRAZolam  1 mg Oral Once  . aspirin EC  81 mg Oral Daily  . aspirin  325 mg Oral Once  . busPIRone  15 mg Oral TID  . cloNIDine  0.3 mg Oral TID  . clopidogrel  75 mg Oral Q breakfast  . enoxaparin  40 mg Subcutaneous Q24H  . hydrochlorothiazide  25 mg Oral Daily  . lisinopril  20 mg Oral Daily  . LORazepam  2 mg Intravenous Once  . metoprolol tartrate  25 mg Oral BID  .  morphine injection  4 mg Intravenous Once  . pneumococcal 23 valent vaccine  0.5 mL Intramuscular Tomorrow-1000  . potassium chloride  20 mEq Oral Once  . simvastatin  20  mg Oral q1800  . DISCONTD: aspirin EC  81 mg Oral Daily  . DISCONTD: aspirin  81 mg Oral Daily  . DISCONTD: lisinopril-hydrochlorothiazide  1 tablet Oral Daily    ROS: History obtained from the patient  General ROS: negative for - chills, fatigue, fever, night sweats, weight gain or weight loss Psychological ROS: anxiety, depression Ophthalmic ROS: negative for - blurry vision, double vision, eye pain or loss of vision ENT ROS: negative for - epistaxis, nasal discharge, oral lesions, sore throat, tinnitus or vertigo Allergy and Immunology ROS: negative for - hives or itchy/watery eyes Hematological and Lymphatic ROS: negative for - bleeding problems, bruising or swollen lymph nodes Endocrine ROS: negative for - galactorrhea, hair pattern changes, polydipsia/polyuria or temperature intolerance Respiratory ROS: negative for - cough, hemoptysis, shortness of breath or wheezing Cardiovascular ROS: negative for - chest pain, dyspnea on exertion, edema or  irregular heartbeat Gastrointestinal ROS: negative for - abdominal pain, diarrhea, hematemesis, nausea/vomiting or stool incontinence Genito-Urinary ROS: negative for - dysuria, hematuria, incontinence or urinary frequency/urgency Musculoskeletal ROS: negative for - joint swelling or muscular weakness Neurological ROS: as noted in HPI Dermatological ROS: negative for rash and skin lesion changes  Physical Examination: Blood pressure 128/76, pulse 79, temperature 98.3 F (36.8 C), temperature source Oral, resp. rate 20, height 5\' 4"  (1.626 m), weight 66.2 kg (145 lb 15.1 oz), SpO2 96.00%.  Neurologic Examination: Mental Status: Alert, oriented, thought content appropriate.  Speech fluent without evidence of aphasia.  Able to follow 3 step commands without difficulty. Cranial Nerves: II: visual fields grossly normal, pupils equal, round, reactive to light and accommodation III,IV, VI: ptosis not present, extra-ocular motions intact bilaterally V,VII: mild left facial droop, facial light touch sensation normal bilaterally VIII: hearing normal bilaterally IX,X: gag reflex present XI: trapezius strength/neck flexion strength normal bilaterally XII: tongue strength normal  Motor: Right : Upper extremity   5/5    Left:     Upper extremity   5/5  Lower extremity   5/5     Lower extremity   5/5 Tone and bulk:normal tone throughout; no atrophy noted Sensory: Pinprick and light touch decreased in the last four fingers on the left hand and the left toes Deep Tendon Reflexes: 2+ throughout with absent right AJ. Plantars: Right: mute   Left: upgoing Cerebellar: normal finger-to-nose and normal heel-to-shin test  Results for orders placed during the hospital encounter of 06/03/12 (from the past 48 hour(s))  CBC     Status: Normal   Collection Time   06/03/12  5:05 AM      Component Value Range Comment   WBC 9.8  4.0 - 10.5 (K/uL)    RBC 4.84  3.87 - 5.11 (MIL/uL)    Hemoglobin 14.3  12.0 -  15.0 (g/dL)    HCT 43.0  36.0 - 46.0 (%)    MCV 88.8  78.0 - 100.0 (fL)    MCH 29.5  26.0 - 34.0 (pg)    MCHC 33.3  30.0 - 36.0 (g/dL)    RDW 14.6  11.5 - 15.5 (%)    Platelets 335  150 - 400 (K/uL)   BASIC METABOLIC PANEL     Status: Abnormal   Collection Time   06/03/12  5:05 AM      Component Value Range Comment   Sodium 140  135 - 145 (mEq/L)    Potassium 3.0 (*) 3.5 - 5.1 (mEq/L)    Chloride 102  96 - 112 (mEq/L)    CO2  26  19 - 32 (mEq/L)    Glucose, Bld 111 (*) 70 - 99 (mg/dL)    BUN 19  6 - 23 (mg/dL)    Creatinine, Ser 1.00  0.50 - 1.10 (mg/dL)    Calcium 10.8 (*) 8.4 - 10.5 (mg/dL)    GFR calc non Af Amer 62 (*) >90 (mL/min)    GFR calc Af Amer 72 (*) >90 (mL/min)    Ct Head Wo Contrast  06/03/2012  *RADIOLOGY REPORT*  Clinical Data: Weakness, left arm tingling  CT HEAD WITHOUT CONTRAST  Technique:  Contiguous axial images were obtained from the base of the skull through the vertex without contrast.  Comparison: None.  Findings: Prominence of the sulci, cisterns, and ventricles, in keeping with volume loss. There are subcortical and periventricular white matter hypodensities, a nonspecific finding most often seen with chronic microangiopathic changes. Hypodensities within the bilateral basal ganglia may reflect age indeterminate lacunar infarctions.  There is no evidence for acute hemorrhage, overt hydrocephalus, mass lesion, or abnormal extra-axial fluid collection.  No definite CT evidence for acute cortical based (large artery) infarction. The visualized paranasal sinuses and mastoid air cells are predominately clear.  IMPRESSION: Volume loss and white matter changes as above. Age indeterminate lacunar infarctions within the basal ganglia.  Otherwise, no definite acute intracranial abnormality.  Original Report Authenticated By: Suanne Marker, M.D.   Mr Brain Wo Contrast  06/03/2012  *RADIOLOGY REPORT*  Clinical Data: Elevated blood pressure.  Tinging left hand and leg.  MRI  HEAD WITHOUT CONTRAST  Technique:  Multiplanar, multiecho pulse sequences of the brain and surrounding structures were obtained according to standard protocol without intravenous contrast.  Comparison: CT head earlier in the day.  Findings: There are subcentimeter areas of restricted diffusion ( series 3) noted in the left cerebellar cortex(image 6) and left paramedian lower pons (image 8) concerning for acute infarction. There are no foci of acute hemorrhage associated with these two lesions, but there are multiple foci of chronic microbleeds throughout the brain, likely a manifestation of hypertensive cerebrovascular disease (multiple arrows, series 10).  Premature atrophy is present for the patient's age of 57.  There is chronic microvascular ischemic change in the periventricular and subcortical white matter.  No large vessel infarcts are seen. Bilateral thalamic and basal ganglia lacunar infarcts are seen. Remote periventricular white matter lacunar infarcts are noted. The major intracranial vascular structures are patent based on flow void signal. Empty sella is present. Mild sclerosis of the clivus is of uncertain significance.  There is no acute sinus or mastoid disease.  IMPRESSION: Subcentimeter areas of restricted diffusion left cerebellar cortex and  left paramedian lower pons consistent with acute infarction.  Atrophy, chronic microvascular ischemic change, numerous lacunes, and scattered microbleeds, all consistent with advanced hypertensive cerebrovascular disease.  Original Report Authenticated By: Staci Righter, M.D.    Assessment: 56 y.o. female presenting with numbness in her fingers and toes on the left and subcentimeter left cerebellar and pontine infarcts.  Symptoms do not correspond to findings on imaging but remain concerned about the fact that the patient has these acute events.  Stroke work up appropriate.  Posterior circulation of biggest concern with above findings.    Stroke Risk  Factors - hypertension, smoking and stroke in the past.  Plan: 1. HgbA1c, fasting lipid panel 2. CT angio of head and neck 3. PT consult, OT consult 4. Echocardiogram 5. Carotid dopplers 6. Prophylactic therapy-Continue ASA and Plavix 7. Risk factor modification.  Patient  advised about discontinuation of smoking.  Will need smoking cessation counseling. 8. Telemetry monitoring 9. Frequent neuro checks    Alexis Goodell, MD [facility] [phone] 06/03/2012, 6:15 PM

## 2012-06-03 NOTE — ED Notes (Signed)
Attempted to transport pt to room 1432. Pt stated that she does not understand why she is being admitted and wants to see her nurse to have a better explanation of why she needs to stay. RN Wynetta Emery is currently with another pt and will notify her when she becomes available.

## 2012-06-03 NOTE — ED Notes (Addendum)
Removed pt ear bar and placed in biohazard bag d/t MRI incompatibility. Given to pt to keep in purse until she returns from MRI

## 2012-06-04 DIAGNOSIS — I6340 Cerebral infarction due to embolism of unspecified cerebral artery: Secondary | ICD-10-CM

## 2012-06-04 DIAGNOSIS — E782 Mixed hyperlipidemia: Secondary | ICD-10-CM

## 2012-06-04 DIAGNOSIS — G459 Transient cerebral ischemic attack, unspecified: Secondary | ICD-10-CM

## 2012-06-04 DIAGNOSIS — R209 Unspecified disturbances of skin sensation: Secondary | ICD-10-CM

## 2012-06-04 DIAGNOSIS — I10 Essential (primary) hypertension: Secondary | ICD-10-CM

## 2012-06-04 DIAGNOSIS — F411 Generalized anxiety disorder: Secondary | ICD-10-CM

## 2012-06-04 LAB — LIPID PANEL
HDL: 27 mg/dL — ABNORMAL LOW (ref >39)
LDL Cholesterol: UNDETERMINED mg/dL (ref 0–99)
Total CHOL/HDL Ratio: 7.5 RATIO
Triglycerides: 674 mg/dL — ABNORMAL HIGH (ref <150)

## 2012-06-04 MED ORDER — FENOFIBRATE 54 MG PO TABS
54.0000 mg | ORAL_TABLET | Freq: Every day | ORAL | Status: DC
  Filled 2012-06-04: qty 1

## 2012-06-04 MED ORDER — LISINOPRIL-HYDROCHLOROTHIAZIDE 20-25 MG PO TABS: 1.0000 | ORAL_TABLET | Freq: Every day | ORAL | Status: DC

## 2012-06-04 MED ORDER — FENOFIBRATE 54 MG PO TABS: 54.0000 mg | ORAL_TABLET | Freq: Every day | ORAL | Status: DC

## 2012-06-04 MED ORDER — ALPRAZOLAM 1 MG PO TABS: 1.0000 mg | ORAL_TABLET | Freq: Three times a day (TID) | ORAL | Status: DC

## 2012-06-04 MED ORDER — CLONIDINE HCL 0.3 MG PO TABS: 0.3000 mg | ORAL_TABLET | Freq: Three times a day (TID) | ORAL | Status: DC

## 2012-06-04 MED ORDER — CLOPIDOGREL BISULFATE 75 MG PO TABS: 75.0000 mg | ORAL_TABLET | Freq: Every day | ORAL | Status: DC

## 2012-06-04 MED ORDER — METOPROLOL TARTRATE 25 MG PO TABS: 25.0000 mg | ORAL_TABLET | Freq: Two times a day (BID) | ORAL | Status: DC

## 2012-06-04 NOTE — Progress Notes (Signed)
SLP Cancellation Note  Treatment cancelled today due to pt reportedly at baseline cogling function.  Pt observed using hospital phone to call for lunch and speaking on cell phone to someone else.  Pt speech was clear and cognitiion appeared intact.  Please reorder if note functional cogling deficits related to this cva.  Pt reports being frustrated with her hospital stay and wanting to go home.  Thanks.   Luanna Salk, [facility] SLP [phone]

## 2012-06-04 NOTE — Progress Notes (Signed)
  Echocardiogram 2D Echocardiogram has been performed.  Renee Griffin L 06/04/2012, 12:07 PM

## 2012-06-04 NOTE — Discharge Summary (Signed)
Physician Discharge Summary  Patient ID: Renee Griffin MRN: [id number] DOB/AGE: [birth year] 56 y.o.  Admit date: 06/03/2012 Discharge date: 06/04/2012  Primary Care Physician:  No primary provider on file.   Discharge Diagnoses:    Patient Active Problem List  Diagnoses  . CVA (cerebral infarction)  . HTN (hypertension)  . Dyslipidemia  . Anxiety    Medication List  As of 06/04/2012  2:24 PM   STOP taking these medications         hydrochlorothiazide 25 MG tablet         TAKE these medications         ALPRAZolam 1 MG tablet   Commonly known as: XANAX   Take 1 tablet (1 mg total) by mouth 3 (three) times daily.      aspirin 81 MG tablet   Take 81 mg by mouth daily.      busPIRone 15 MG tablet   Commonly known as: BUSPAR   Take 1 tablet (15 mg total) by mouth 3 (three) times daily.      cloNIDine 0.3 MG tablet   Commonly known as: CATAPRES   Take 1 tablet (0.3 mg total) by mouth 3 (three) times daily.      clopidogrel 75 MG tablet   Commonly known as: PLAVIX   Take 1 tablet (75 mg total) by mouth daily.      fenofibrate 54 MG tablet   Take 1 tablet (54 mg total) by mouth at bedtime.      lisinopril-hydrochlorothiazide 20-25 MG per tablet   Commonly known as: PRINZIDE,ZESTORETIC   Take 1 tablet by mouth daily.      metoprolol tartrate 25 MG tablet   Commonly known as: LOPRESSOR   Take 1 tablet (25 mg total) by mouth 2 (two) times daily.      pravastatin 40 MG tablet   Commonly known as: PRAVACHOL   Take 40 mg by mouth daily.             Disposition and Follow-up:  Follow up with primary MD, and with Dr Antony Contras, neurologist.  Consults:  Neurology Dr Alexis Goodell, neurologist.  Significant Diagnostic Studies:  Ct Angio Head W/cm &/or Wo Cm  06/04/2012  *RADIOLOGY REPORT*  Clinical Data:  Tingling of the left fingers.  Numbness of the left toes.  Brainstem and cerebellar abnormalities by MRI.  CT ANGIOGRAPHY HEAD AND NECK  Technique:   Multidetector CT imaging of the head and neck was performed using the standard protocol during bolus administration of intravenous contrast.  Multiplanar CT image reconstructions including MIPs were obtained to evaluate the vascular anatomy. Carotid stenosis measurements (when applicable) are obtained utilizing NASCET criteria, using the distal internal carotid diameter as the denominator.  Contrast: 128mL OMNIPAQUE IOHEXOL 300 MG/ML  SOLN  Comparison:  MRI 06/03/2012  CTA NECK  Findings:  There is atherosclerosis of the aortic arch.  The patient is had previous median sternotomy.  There is mild aneurysmal dilatation of the aortic arch.  There is some sort of band device around the ascending aorta.  I do not see evidence of active dissection.  Brachiocephalic vessel origins appear normal. The innominate artery is ectatic.  The right common carotid arteries widely patent to the bifurcation. There is mild atherosclerotic disease at the carotid bifurcation but there is no narrowing of the proximal ICA beyond that of the more distal cervical ICA and therefore no stenosis.  The cervical internal carotid artery is a somewhat small vessel with a diameter  of only 4 mm.  The left common carotid artery is widely patent to the bifurcation. There is atherosclerotic disease of the carotid bifurcation but there is no narrowing of the ICA beyond that of the more distal cervical ICA and therefore no stenosis.  Again, the distal cervical ICA is a small vessel, only 4 mm in diameter.  Right vertebral artery origin is widely patent.  The right vertebral arteries widely patent throughout its course.  There is no narrowing or irregularity.  The left vertebral artery is widely patent at its origin and through the cervical region.  Proximal detail is somewhat limited by venous reflux. At initial interpretation, it was suggested that the left vertebral artery could be somewhat narrowed at this C4-5 level related to the vertebral  anterolisthesis described below. This is of questionable significance in my opinion.  There is scoliosis and degenerative spondylosis in the cervical spine.  There is anterolisthesis at C4-5 of 5 mm.  Degenerative disc disease is most pronounced at C4-5, C5-6 and C6-7.   Review of the MIP images confirms the above findings.  IMPRESSION: Previous thoracic surgery presumably for treatment of aortic aneurysm.  No discernible dissection or active process, though the aorta was not the primary target of this examination.  Mild atherosclerotic disease at both carotid bifurcations without stenosis, when compared to a more distal cervical internal carotid arteries.  Plaque at the proximal ICA on the right could possibly serve as a source of emboli.  Both cervical internal carotid arteries are small vessels but without focal disease.  No acute vertebral pathology evident. No preliminary report, it was noted that the left vertebral artery is somewhat thinned at the C4- 5 level, where there is 5 mm of anterolisthesis, possibly stretching more distorting the vertebral artery.  I think the significance is questionable.  CTA HEAD  Findings:  Both internal carotid arteries are patent through the skull base.  No siphon stenosis.  The anterior and middle cerebral vessels are patent without proximal stenosis, aneurysm or vascular malformation.  Both vertebral arteries are widely patent to the basilar.  No basilar stenosis.  Posterior circulation branch vessels are patent and normal.  The basilar artery is mildly ectatic.  No acute brain parenchymal pathology is seen.  There is mild generalized atrophy.  In the area of the peripheral left cerebellum where low-level restricted diffusion was suspected at MR, there appears to be a prominent vessel.  This raises the possibility of a small venous angioma.   Review of the MIP images confirms the above findings.  IMPRESSION: No intracranial stenosis or occlusion.  Ectasia of the basilar  artery.  Original Report Authenticated By: Jules Schick, M.D.   Dg Chest 2 View  06/03/2012  *RADIOLOGY REPORT*  Clinical Data: Shortness of breath, weakness  CHEST - 2 VIEW  Comparison: None.  Findings: Status post median sternotomy.  Mild prominence / tortuosity to the thoracic aorta.  Heart size upper normal limits. Minimal left lung base opacity.  Lungs are otherwise predominately clear.  Surgical clips project over the right apex / axilla.  Mild leftward curvature of the spine.  No acute osseous abnormality. Thoracolumbar degenerative disc disease.  IMPRESSION: Status post median sternotomy with mildly prominent cardiomediastinal contours.  Minimal left lung base opacities likely scarring or atelectasis. Otherwise, no focal consolidation.  Original Report Authenticated By: Suanne Marker, M.D.   Ct Head Wo Contrast  06/03/2012  *RADIOLOGY REPORT*  Clinical Data: Weakness, left arm tingling  CT HEAD WITHOUT  CONTRAST  Technique:  Contiguous axial images were obtained from the base of the skull through the vertex without contrast.  Comparison: None.  Findings: Prominence of the sulci, cisterns, and ventricles, in keeping with volume loss. There are subcortical and periventricular white matter hypodensities, a nonspecific finding most often seen with chronic microangiopathic changes. Hypodensities within the bilateral basal ganglia may reflect age indeterminate lacunar infarctions.  There is no evidence for acute hemorrhage, overt hydrocephalus, mass lesion, or abnormal extra-axial fluid collection.  No definite CT evidence for acute cortical based (large artery) infarction. The visualized paranasal sinuses and mastoid air cells are predominately clear.  IMPRESSION: Volume loss and white matter changes as above. Age indeterminate lacunar infarctions within the basal ganglia.  Otherwise, no definite acute intracranial abnormality.  Original Report Authenticated By: Suanne Marker, M.D.   Ct Angio  Neck W/cm &/or Wo/cm  06/04/2012  *RADIOLOGY REPORT*  Clinical Data:  Tingling of the left fingers.  Numbness of the left toes.  Brainstem and cerebellar abnormalities by MRI.  CT ANGIOGRAPHY HEAD AND NECK  Technique:  Multidetector CT imaging of the head and neck was performed using the standard protocol during bolus administration of intravenous contrast.  Multiplanar CT image reconstructions including MIPs were obtained to evaluate the vascular anatomy. Carotid stenosis measurements (when applicable) are obtained utilizing NASCET criteria, using the distal internal carotid diameter as the denominator.  Contrast: 115mL OMNIPAQUE IOHEXOL 300 MG/ML  SOLN  Comparison:  MRI 06/03/2012  CTA NECK  Findings:  There is atherosclerosis of the aortic arch.  The patient is had previous median sternotomy.  There is mild aneurysmal dilatation of the aortic arch.  There is some sort of band device around the ascending aorta.  I do not see evidence of active dissection.  Brachiocephalic vessel origins appear normal. The innominate artery is ectatic.  The right common carotid arteries widely patent to the bifurcation. There is mild atherosclerotic disease at the carotid bifurcation but there is no narrowing of the proximal ICA beyond that of the more distal cervical ICA and therefore no stenosis.  The cervical internal carotid artery is a somewhat small vessel with a diameter of only 4 mm.  The left common carotid artery is widely patent to the bifurcation. There is atherosclerotic disease of the carotid bifurcation but there is no narrowing of the ICA beyond that of the more distal cervical ICA and therefore no stenosis.  Again, the distal cervical ICA is a small vessel, only 4 mm in diameter.  Right vertebral artery origin is widely patent.  The right vertebral arteries widely patent throughout its course.  There is no narrowing or irregularity.  The left vertebral artery is widely patent at its origin and through the cervical  region.  Proximal detail is somewhat limited by venous reflux. At initial interpretation, it was suggested that the left vertebral artery could be somewhat narrowed at this C4-5 level related to the vertebral anterolisthesis described below. This is of questionable significance in my opinion.  There is scoliosis and degenerative spondylosis in the cervical spine.  There is anterolisthesis at C4-5 of 5 mm.  Degenerative disc disease is most pronounced at C4-5, C5-6 and C6-7.   Review of the MIP images confirms the above findings.  IMPRESSION: Previous thoracic surgery presumably for treatment of aortic aneurysm.  No discernible dissection or active process, though the aorta was not the primary target of this examination.  Mild atherosclerotic disease at both carotid bifurcations without stenosis, when compared to  a more distal cervical internal carotid arteries.  Plaque at the proximal ICA on the right could possibly serve as a source of emboli.  Both cervical internal carotid arteries are small vessels but without focal disease.  No acute vertebral pathology evident. No preliminary report, it was noted that the left vertebral artery is somewhat thinned at the C4- 5 level, where there is 5 mm of anterolisthesis, possibly stretching more distorting the vertebral artery.  I think the significance is questionable.  CTA HEAD  Findings:  Both internal carotid arteries are patent through the skull base.  No siphon stenosis.  The anterior and middle cerebral vessels are patent without proximal stenosis, aneurysm or vascular malformation.  Both vertebral arteries are widely patent to the basilar.  No basilar stenosis.  Posterior circulation branch vessels are patent and normal.  The basilar artery is mildly ectatic.  No acute brain parenchymal pathology is seen.  There is mild generalized atrophy.  In the area of the peripheral left cerebellum where low-level restricted diffusion was suspected at MR, there appears to be a  prominent vessel.  This raises the possibility of a small venous angioma.   Review of the MIP images confirms the above findings.  IMPRESSION: No intracranial stenosis or occlusion.  Ectasia of the basilar artery.  Original Report Authenticated By: Jules Schick, M.D.   Mr Brain Wo Contrast  06/03/2012  *RADIOLOGY REPORT*  Clinical Data: Elevated blood pressure.  Tinging left hand and leg.  MRI HEAD WITHOUT CONTRAST  Technique:  Multiplanar, multiecho pulse sequences of the brain and surrounding structures were obtained according to standard protocol without intravenous contrast.  Comparison: CT head earlier in the day.  Findings: There are subcentimeter areas of restricted diffusion ( series 3) noted in the left cerebellar cortex(image 6) and left paramedian lower pons (image 8) concerning for acute infarction. There are no foci of acute hemorrhage associated with these two lesions, but there are multiple foci of chronic microbleeds throughout the brain, likely a manifestation of hypertensive cerebrovascular disease (multiple arrows, series 10).  Premature atrophy is present for the patient's age of 31.  There is chronic microvascular ischemic change in the periventricular and subcortical white matter.  No large vessel infarcts are seen. Bilateral thalamic and basal ganglia lacunar infarcts are seen. Remote periventricular white matter lacunar infarcts are noted. The major intracranial vascular structures are patent based on flow void signal. Empty sella is present. Mild sclerosis of the clivus is of uncertain significance.  There is no acute sinus or mastoid disease.  IMPRESSION: Subcentimeter areas of restricted diffusion left cerebellar cortex and  left paramedian lower pons consistent with acute infarction.  Atrophy, chronic microvascular ischemic change, numerous lacunes, and scattered microbleeds, all consistent with advanced hypertensive cerebrovascular disease.  Original Report Authenticated By: Staci Righter, M.D.    Brief H and P: For complete details, refer to admission H and P. However, in brief, this is a 56 year old female, with known history of HTN, Depression, Anxiety/Panic attacks, dyslipidemia, 3 previous CVAs, s/p cardiac surgery, with porcine aortic valve placement, due to congenital defect, per patient, smoking, presenting with numbness and tingling of left hand and foot, which commenced at about 1:30 PM on 06/02/12, while patient was lying in bed, trying to get comfortable, as she had been nursing left shoulder pain, which had troubled her for one week, without antecedent trauma. Patient had no visual obscurations, speech difficulties or weakness of extremities. She activated her life-alert system, and  was brought by EMS to ED Patient's NIH score was 1, at presentation. She was admitted for further evaluation, investigation and management.   Physical Exam: On 06/04/12. General: Comfortable,. Alert, communicative, fully oriented, talking in complete sentences, not short of breath at rest, emotionally labile easily breaking into tears, appears quite forgetful.  HEENT: No clinical pallor, no jaundice, no conjunctival injection or discharge. Hydration status is fair.  NECK: Supple, JVP not seen, no carotid bruits, no palpable lymphadenopathy, no palpable goiter.  CHEST: Has median sternotomy scar. Clinically clear to auscultation, no wheezes, no crackles.  HEART: Sounds 1 and 2 heard, normal, regular, no murmurs.  ABDOMEN: Full, soft, non-tender, no palpable organomegaly, no palpable masses, normal bowel sounds.  GENITALIA: Not examined.  LOWER EXTREMITIES: No pitting edema, palpable peripheral pulses.  MUSCULOSKELETAL SYSTEM: Generalized osteoarthritic changes, otherwise, normal.  CENTRAL NERVOUS SYSTEM: No focal neurologic deficit on gross examination.   Hospital Course:  Principal Problem:  *CVA (cerebral infarction): Patient presented with paresthesiae of left hand and foot, but no  objective neurologic deficit. Head CT scan was devoid of acute findings. Given NIH score of 1, t-PA was not considered an option. Follow up brain MRI revealed subcentimeter areas of restricted diffusion left cerebellar cortex and left paramedian lower pons consistent with acute infarction. In addition, there was atrophy, chronic microvascular ischemic change, numerous lacunes, and scattered microbleeds, all consistent with advanced hypertensive cerebrovascular disease. She has recurrent acute CVA, although her presenting symptoms are in the wrong anatomic distribution, and may be attributable to anxiety. She was admitted for CVA work up. Neurology consultation was provided by Dr Alexis Goodell, who recommended continuation of ASA and Plavix for secondary prophylaxis. CT angiogram of neck showed Mild atherosclerotic disease at both carotid bifurcations without stenosis, plaque at the proximal ICA on the right and no acute vertebral pathology evident. CTA of head, showed no intracranial stenosis or occlusion. Ectasia of the basilar artery. Carotid doppler, showed no ICA stenosis. Vertebral artery flow was antegrade. Patient had no further neurologic symptomatology, or indeed, obvious deficit, clinically. She was evaluated by PT/OT, and although no acute needs were identified, patient has declined HHPT. Active Problems:  1. HTN (hypertension): Patient's blood pressure was uncontrolled at presentation and was as high as 234/141 initially, then improved at 155/85. Pre-admission, she was on Lisinopril-HCTZ, HCTZ and Lopressor, but she ran out of her these, a month ago. These medications have been reinstated, with satisfactory BP control. .  2. Dyslipidemia: Patient is on statin, which we continued. Lipid panel showed TC 202, TG 674, HDL 27. Fenofibrate has been added to her treatment, to address hypertriglyceridemia. 3. Anxiey/Panic attacks: Patient appears very anxious, and emotionally labile. Managed with  anxiolytics.  4. Tobacco Abuse: Patient smokes a pack of cigarettes per day, and has done so for over 40 years. She emphatically assures me that she has no desire to quit. I offered a Nicoderm patch, but patient states that she is allergic to this. Neverth less, she has been strongly urged to quit.   Comment: Patient is considered stable for discharge on 06/04/12. She has an appointment with her new PMD at the end of this month, and has been encouraged to keep that appointment.  Time spent on Discharge: 40 mins.  Signed: Timtohy Broski,CHRISTOPHER 06/04/2012, 2:24 PM

## 2012-06-04 NOTE — Progress Notes (Signed)
Subjective: Patient reports that she is stable.  No development of weakness or progression of her numbness.  Objective: Current vital signs: BP 126/86  Pulse 61  Temp(Src) 97.6 F (36.4 C) (Oral)  Resp 18  Ht 5\' 4"  (1.626 m)  Wt 66.2 kg (145 lb 15.1 oz)  BMI 25.05 kg/m2  SpO2 98% Vital signs in last 24 hours: Temp:  [97.5 F (36.4 C)-98.4 F (36.9 C)] 97.6 F (36.4 C) (06/10 1411) Pulse Rate:  [56-64] 61  (06/10 1411) Resp:  [18-19] 18  (06/10 1411) BP: (123-152)/(79-87) 126/86 mmHg (06/10 1411) SpO2:  [95 %-99 %] 98 % (06/10 1411)  Intake/Output from previous day: 06/09 0701 - 06/10 0700 In: -  Out: 1000 [Urine:1000] Intake/Output this shift: Total I/O In: 600 [P.O.:600] Out: -  Nutritional status: Cardiac  Neurologic Exam: Mental Status:  Alert, oriented, thought content appropriate. Speech fluent without evidence of aphasia. Able to follow 3 step commands without difficulty.  Cranial Nerves:  II: visual fields grossly normal, pupils equal, round, reactive to light and accommodation  III,IV, VI: ptosis not present, extra-ocular motions intact bilaterally  V,VII: mild left facial droop, facial light touch sensation normal bilaterally  VIII: hearing normal bilaterally  IX,X: gag reflex present  XI: trapezius strength/neck flexion strength normal bilaterally  XII: tongue strength normal  Motor:  Right : Upper extremity 5/5 Left: Upper extremity 5/5  Lower extremity 5/5 Lower extremity 5/5  Tone and bulk:normal tone throughout; no atrophy noted  Sensory: Pinprick and light touch decreased in the last four fingers on the left hand and the left toes  Deep Tendon Reflexes: 2+ throughout with absent right AJ.  Plantars:  Right: mute Left: upgoing  Cerebellar:  normal finger-to-nose and normal heel-to-shin test   Lab Results: Results for orders placed during the hospital encounter of 06/03/12 (from the past 48 hour(s))  CBC     Status: Normal   Collection Time   06/03/12  5:05 AM      Component Value Range Comment   WBC 9.8  4.0 - 10.5 (K/uL)    RBC 4.84  3.87 - 5.11 (MIL/uL)    Hemoglobin 14.3  12.0 - 15.0 (g/dL)    HCT 43.0  36.0 - 46.0 (%)    MCV 88.8  78.0 - 100.0 (fL)    MCH 29.5  26.0 - 34.0 (pg)    MCHC 33.3  30.0 - 36.0 (g/dL)    RDW 14.6  11.5 - 15.5 (%)    Platelets 335  150 - 400 (K/uL)   BASIC METABOLIC PANEL     Status: Abnormal   Collection Time   06/03/12  5:05 AM      Component Value Range Comment   Sodium 140  135 - 145 (mEq/L)    Potassium 3.0 (*) 3.5 - 5.1 (mEq/L)    Chloride 102  96 - 112 (mEq/L)    CO2 26  19 - 32 (mEq/L)    Glucose, Bld 111 (*) 70 - 99 (mg/dL)    BUN 19  6 - 23 (mg/dL)    Creatinine, Ser 1.00  0.50 - 1.10 (mg/dL)    Calcium 10.8 (*) 8.4 - 10.5 (mg/dL)    GFR calc non Af Amer 62 (*) >90 (mL/min)    GFR calc Af Amer 72 (*) >90 (mL/min)   HEMOGLOBIN A1C     Status: Abnormal   Collection Time   06/04/12  4:30 AM      Component Value Range Comment  Hemoglobin A1C 6.1 (*) <5.7 (%)    Mean Plasma Glucose 128 (*) <117 (mg/dL)   LIPID PANEL     Status: Abnormal   Collection Time   06/04/12  4:30 AM      Component Value Range Comment   Cholesterol 202 (*) 0 - 200 (mg/dL)    Triglycerides 674 (*) <150 (mg/dL)    HDL 27 (*) >39 (mg/dL)    Total CHOL/HDL Ratio 7.5      VLDL UNABLE TO CALCULATE IF TRIGLYCERIDE OVER 400 mg/dL  0 - 40 (mg/dL)    LDL Cholesterol UNABLE TO CALCULATE IF TRIGLYCERIDE OVER 400 mg/dL  0 - 99 (mg/dL)     No results found for this or any previous visit (from the past 240 hour(s)).  Lipid Panel  Basename 06/04/12 0430  CHOL 202*  TRIG 674*  HDL 27*  CHOLHDL 7.5  VLDL UNABLE TO CALCULATE IF TRIGLYCERIDE OVER 400 mg/dL  LDLCALC UNABLE TO CALCULATE IF TRIGLYCERIDE OVER 400 mg/dL    Studies/Results: Ct Angio Head W/cm &/or Wo Cm  06/04/2012  *RADIOLOGY REPORT*  Clinical Data:  Tingling of the left fingers.  Numbness of the left toes.  Brainstem and cerebellar abnormalities by  MRI.  CT ANGIOGRAPHY HEAD AND NECK  Technique:  Multidetector CT imaging of the head and neck was performed using the standard protocol during bolus administration of intravenous contrast.  Multiplanar CT image reconstructions including MIPs were obtained to evaluate the vascular anatomy. Carotid stenosis measurements (when applicable) are obtained utilizing NASCET criteria, using the distal internal carotid diameter as the denominator.  Contrast: 111mL OMNIPAQUE IOHEXOL 300 MG/ML  SOLN  Comparison:  MRI 06/03/2012  CTA NECK  Findings:  There is atherosclerosis of the aortic arch.  The patient is had previous median sternotomy.  There is mild aneurysmal dilatation of the aortic arch.  There is some sort of band device around the ascending aorta.  I do not see evidence of active dissection.  Brachiocephalic vessel origins appear normal. The innominate artery is ectatic.  The right common carotid arteries widely patent to the bifurcation. There is mild atherosclerotic disease at the carotid bifurcation but there is no narrowing of the proximal ICA beyond that of the more distal cervical ICA and therefore no stenosis.  The cervical internal carotid artery is a somewhat small vessel with a diameter of only 4 mm.  The left common carotid artery is widely patent to the bifurcation. There is atherosclerotic disease of the carotid bifurcation but there is no narrowing of the ICA beyond that of the more distal cervical ICA and therefore no stenosis.  Again, the distal cervical ICA is a small vessel, only 4 mm in diameter.  Right vertebral artery origin is widely patent.  The right vertebral arteries widely patent throughout its course.  There is no narrowing or irregularity.  The left vertebral artery is widely patent at its origin and through the cervical region.  Proximal detail is somewhat limited by venous reflux. At initial interpretation, it was suggested that the left vertebral artery could be somewhat narrowed at  this C4-5 level related to the vertebral anterolisthesis described below. This is of questionable significance in my opinion.  There is scoliosis and degenerative spondylosis in the cervical spine.  There is anterolisthesis at C4-5 of 5 mm.  Degenerative disc disease is most pronounced at C4-5, C5-6 and C6-7.   Review of the MIP images confirms the above findings.  IMPRESSION: Previous thoracic surgery presumably for treatment of  aortic aneurysm.  No discernible dissection or active process, though the aorta was not the primary target of this examination.  Mild atherosclerotic disease at both carotid bifurcations without stenosis, when compared to a more distal cervical internal carotid arteries.  Plaque at the proximal ICA on the right could possibly serve as a source of emboli.  Both cervical internal carotid arteries are small vessels but without focal disease.  No acute vertebral pathology evident. No preliminary report, it was noted that the left vertebral artery is somewhat thinned at the C4- 5 level, where there is 5 mm of anterolisthesis, possibly stretching more distorting the vertebral artery.  I think the significance is questionable.  CTA HEAD  Findings:  Both internal carotid arteries are patent through the skull base.  No siphon stenosis.  The anterior and middle cerebral vessels are patent without proximal stenosis, aneurysm or vascular malformation.  Both vertebral arteries are widely patent to the basilar.  No basilar stenosis.  Posterior circulation branch vessels are patent and normal.  The basilar artery is mildly ectatic.  No acute brain parenchymal pathology is seen.  There is mild generalized atrophy.  In the area of the peripheral left cerebellum where low-level restricted diffusion was suspected at MR, there appears to be a prominent vessel.  This raises the possibility of a small venous angioma.   Review of the MIP images confirms the above findings.  IMPRESSION: No intracranial stenosis or  occlusion.  Ectasia of the basilar artery.  Original Report Authenticated By: Jules Schick, M.D.   Dg Chest 2 View  06/03/2012  *RADIOLOGY REPORT*  Clinical Data: Shortness of breath, weakness  CHEST - 2 VIEW  Comparison: None.  Findings: Status post median sternotomy.  Mild prominence / tortuosity to the thoracic aorta.  Heart size upper normal limits. Minimal left lung base opacity.  Lungs are otherwise predominately clear.  Surgical clips project over the right apex / axilla.  Mild leftward curvature of the spine.  No acute osseous abnormality. Thoracolumbar degenerative disc disease.  IMPRESSION: Status post median sternotomy with mildly prominent cardiomediastinal contours.  Minimal left lung base opacities likely scarring or atelectasis. Otherwise, no focal consolidation.  Original Report Authenticated By: Suanne Marker, M.D.   Ct Head Wo Contrast  06/03/2012  *RADIOLOGY REPORT*  Clinical Data: Weakness, left arm tingling  CT HEAD WITHOUT CONTRAST  Technique:  Contiguous axial images were obtained from the base of the skull through the vertex without contrast.  Comparison: None.  Findings: Prominence of the sulci, cisterns, and ventricles, in keeping with volume loss. There are subcortical and periventricular white matter hypodensities, a nonspecific finding most often seen with chronic microangiopathic changes. Hypodensities within the bilateral basal ganglia may reflect age indeterminate lacunar infarctions.  There is no evidence for acute hemorrhage, overt hydrocephalus, mass lesion, or abnormal extra-axial fluid collection.  No definite CT evidence for acute cortical based (large artery) infarction. The visualized paranasal sinuses and mastoid air cells are predominately clear.  IMPRESSION: Volume loss and white matter changes as above. Age indeterminate lacunar infarctions within the basal ganglia.  Otherwise, no definite acute intracranial abnormality.  Original Report Authenticated By: Suanne Marker, M.D.   Ct Angio Neck W/cm &/or Wo/cm  06/04/2012  *RADIOLOGY REPORT*  Clinical Data:  Tingling of the left fingers.  Numbness of the left toes.  Brainstem and cerebellar abnormalities by MRI.  CT ANGIOGRAPHY HEAD AND NECK  Technique:  Multidetector CT imaging of the head and neck was performed  using the standard protocol during bolus administration of intravenous contrast.  Multiplanar CT image reconstructions including MIPs were obtained to evaluate the vascular anatomy. Carotid stenosis measurements (when applicable) are obtained utilizing NASCET criteria, using the distal internal carotid diameter as the denominator.  Contrast: 139mL OMNIPAQUE IOHEXOL 300 MG/ML  SOLN  Comparison:  MRI 06/03/2012  CTA NECK  Findings:  There is atherosclerosis of the aortic arch.  The patient is had previous median sternotomy.  There is mild aneurysmal dilatation of the aortic arch.  There is some sort of band device around the ascending aorta.  I do not see evidence of active dissection.  Brachiocephalic vessel origins appear normal. The innominate artery is ectatic.  The right common carotid arteries widely patent to the bifurcation. There is mild atherosclerotic disease at the carotid bifurcation but there is no narrowing of the proximal ICA beyond that of the more distal cervical ICA and therefore no stenosis.  The cervical internal carotid artery is a somewhat small vessel with a diameter of only 4 mm.  The left common carotid artery is widely patent to the bifurcation. There is atherosclerotic disease of the carotid bifurcation but there is no narrowing of the ICA beyond that of the more distal cervical ICA and therefore no stenosis.  Again, the distal cervical ICA is a small vessel, only 4 mm in diameter.  Right vertebral artery origin is widely patent.  The right vertebral arteries widely patent throughout its course.  There is no narrowing or irregularity.  The left vertebral artery is widely patent at its  origin and through the cervical region.  Proximal detail is somewhat limited by venous reflux. At initial interpretation, it was suggested that the left vertebral artery could be somewhat narrowed at this C4-5 level related to the vertebral anterolisthesis described below. This is of questionable significance in my opinion.  There is scoliosis and degenerative spondylosis in the cervical spine.  There is anterolisthesis at C4-5 of 5 mm.  Degenerative disc disease is most pronounced at C4-5, C5-6 and C6-7.   Review of the MIP images confirms the above findings.  IMPRESSION: Previous thoracic surgery presumably for treatment of aortic aneurysm.  No discernible dissection or active process, though the aorta was not the primary target of this examination.  Mild atherosclerotic disease at both carotid bifurcations without stenosis, when compared to a more distal cervical internal carotid arteries.  Plaque at the proximal ICA on the right could possibly serve as a source of emboli.  Both cervical internal carotid arteries are small vessels but without focal disease.  No acute vertebral pathology evident. No preliminary report, it was noted that the left vertebral artery is somewhat thinned at the C4- 5 level, where there is 5 mm of anterolisthesis, possibly stretching more distorting the vertebral artery.  I think the significance is questionable.  CTA HEAD  Findings:  Both internal carotid arteries are patent through the skull base.  No siphon stenosis.  The anterior and middle cerebral vessels are patent without proximal stenosis, aneurysm or vascular malformation.  Both vertebral arteries are widely patent to the basilar.  No basilar stenosis.  Posterior circulation branch vessels are patent and normal.  The basilar artery is mildly ectatic.  No acute brain parenchymal pathology is seen.  There is mild generalized atrophy.  In the area of the peripheral left cerebellum where low-level restricted diffusion was suspected  at MR, there appears to be a prominent vessel.  This raises the possibility of a small  venous angioma.   Review of the MIP images confirms the above findings.  IMPRESSION: No intracranial stenosis or occlusion.  Ectasia of the basilar artery.  Original Report Authenticated By: Jules Schick, M.D.   Mr Brain Wo Contrast  06/03/2012  *RADIOLOGY REPORT*  Clinical Data: Elevated blood pressure.  Tinging left hand and leg.  MRI HEAD WITHOUT CONTRAST  Technique:  Multiplanar, multiecho pulse sequences of the brain and surrounding structures were obtained according to standard protocol without intravenous contrast.  Comparison: CT head earlier in the day.  Findings: There are subcentimeter areas of restricted diffusion ( series 3) noted in the left cerebellar cortex(image 6) and left paramedian lower pons (image 8) concerning for acute infarction. There are no foci of acute hemorrhage associated with these two lesions, but there are multiple foci of chronic microbleeds throughout the brain, likely a manifestation of hypertensive cerebrovascular disease (multiple arrows, series 10).  Premature atrophy is present for the patient's age of 38.  There is chronic microvascular ischemic change in the periventricular and subcortical white matter.  No large vessel infarcts are seen. Bilateral thalamic and basal ganglia lacunar infarcts are seen. Remote periventricular white matter lacunar infarcts are noted. The major intracranial vascular structures are patent based on flow void signal. Empty sella is present. Mild sclerosis of the clivus is of uncertain significance.  There is no acute sinus or mastoid disease.  IMPRESSION: Subcentimeter areas of restricted diffusion left cerebellar cortex and  left paramedian lower pons consistent with acute infarction.  Atrophy, chronic microvascular ischemic change, numerous lacunes, and scattered microbleeds, all consistent with advanced hypertensive cerebrovascular disease.  Original  Report Authenticated By: Staci Righter, M.D.    Medications:  I have reviewed the patient's current medications. Scheduled:   . ALPRAZolam  1 mg Oral TID  . aspirin EC  81 mg Oral Daily  . busPIRone  15 mg Oral TID  . cloNIDine  0.3 mg Oral TID  . clopidogrel  75 mg Oral Q breakfast  . enoxaparin  40 mg Subcutaneous Q24H  . fenofibrate  54 mg Oral QHS  . hydrochlorothiazide  25 mg Oral Daily  . lisinopril  20 mg Oral Daily  . metoprolol tartrate  25 mg Oral BID  .  morphine injection  2 mg Intravenous Once  . pneumococcal 23 valent vaccine  0.5 mL Intramuscular Tomorrow-1000  . simvastatin  20 mg Oral q1800    Assessment/Plan:  Patient Active Hospital Problem List: CVA (cerebral infarction) (06/03/2012)   Assessment: Patient stable.  CTA unremarkable, most notably the posterior circulation which was of concern.  Cholesterol elevated.  Statin started.  Has been evaluated by therapy.  Echo pending.  Remains on Plavix and ASA.   Plan:  1.  Continue on ASA and Plavix.  Echo results tobe followed up on an outpatient basis.  2.  Patient to stop smoking    LOS: 1 day   Alexis Goodell, MD [facility] [phone] 06/04/2012  5:21 PM

## 2012-06-04 NOTE — Discharge Instructions (Signed)
[facility] will call you with appointment to see Dr Leonie Man, neurologist.

## 2012-06-04 NOTE — Progress Notes (Signed)
Talked to patient about discharge home today; patient stated that she does not want to go home today and wants to go tomorrow and would not give me a reason why she did not want to go home today; Patient has Medicaid Photographer) -Primary Care Provider: [facility] Contact information : Work Phone: [phone]: [phone]; Patient stated that she could not get her prescriptions filled but patient has full Medicaid benefits with co-pay of $4.00; Instructed patient to reach out to family members and friends to help her with her medication. Patient stated again " I do not want to go home". Talked to patient about what would be different tomorrow if she stayed - nothing; Also informed patient that there are people that are medically sick waiting to be admitted to the hospital for medical care and that her doctor has discharged home. Patient stated that she has no one to pick her up - CM offered assistance with taxi voucher or bus. Patient wanted a taxi voucher. Social worker notified of transportation needs. Mindi Slicker RN, BSN, [place].

## 2012-06-04 NOTE — Evaluation (Signed)
Occupational Therapy Evaluation Patient Details Name: Renee Griffin MRN: [id number] DOB: Feb 15, 1956 Today's Date: 06/04/2012 Time: 1005-1020 OT Time Calculation (min): 15 min  OT Assessment / Plan / Recommendation Clinical Impression  Pt presents with "subcentimeter areas of acute infarction in the left cerebellar cortex and left pons" with history of 3 previous strokes. Pt presents with [facility] strength for bilateral UEs (at least 4/5) but reports numbness in 2nd-5th digits in the fingertips of the left hand. Pt fatigues quickly and will benefit from skilled OT services to improve safety and activity tolerance with all ADL to return home at modfiied independent level. Pt reports having many problems at home and that "you just dont understand." Relayed this information to nursing.       OT Assessment  Patient needs continued OT Services    Follow Up Recommendations  Home health OT;Other (comment) (although pt refusing HH therapy)    Barriers to Discharge      Equipment Recommendations  Other (comment);None recommended by PT (TBA for OT)    Recommendations for Other Services    Frequency  Min 2X/week    Precautions / Restrictions Precautions Precautions: Fall Restrictions Weight Bearing Restrictions: No        ADL  Grooming: Simulated;Wash/dry hands;Set up Where Assessed - Grooming: Unsupported sitting Upper Body Bathing: Simulated;Right arm;Chest;Left arm;Abdomen;Set up Where Assessed - Upper Body Bathing: Unsupported sitting Lower Body Bathing: Simulated;Min guard Where Assessed - Lower Body Bathing: Supported sit to stand Upper Body Dressing: Simulated;Set up Where Assessed - Upper Body Dressing: Unsupported sitting Lower Body Dressing: Simulated;Min guard Where Assessed - Lower Body Dressing: Sopported sit to stand Toilet Transfer: Simulated;Min guard;Other (comment) (with quad cane) Toilet Transfer Method: Other (comment) (ambulating) Toileting - Clothing Manipulation and  Hygiene: Simulated;Min guard Where Assessed - Toileting Clothing Manipulation and Hygiene: Standing Tub/Shower Transfer Method: Not assessed Equipment Used: Other (comment) (quad cane) ADL Comments: Cotx with PT. Pt insistent on not wearing gripper socks or her slippers to get up with therapy despite much encouragement. Pt states she has also been getting up to the bathroom on her own . Encouraged her to call for assist to decrease fall risk. Pt up with PT/OT to ambulate in hallway and during walk, stated she was feeling hot and needed to lie back down. Returned pt to supine. Pt states she feels weak from not being up much the last few days.     OT Diagnosis: Generalized weakness  OT Problem List: Decreased strength;Decreased activity tolerance;Decreased knowledge of use of DME or AE;Impaired sensation OT Treatment Interventions: Self-care/ADL training;Therapeutic activities;DME and/or AE instruction;Patient/family education   OT Goals Acute Rehab OT Goals OT Goal Formulation: With patient Time For Goal Achievement: 06/18/12 Potential to Achieve Goals: Good ADL Goals Pt Will Perform Grooming: with modified independence;Standing at sink ADL Goal: Grooming - Progress: Goal set today Pt Will Perform Lower Body Bathing: with modified independence;Sit to stand from chair;Sit to stand from bed ADL Goal: Lower Body Bathing - Progress: Goal set today Pt Will Perform Lower Body Dressing: with modified independence;Sit to stand from chair;Sit to stand from bed ADL Goal: Lower Body Dressing - Progress: Goal set today Pt Will Transfer to Toilet: with modified independence;Ambulation;Regular height toilet ADL Goal: Toilet Transfer - Progress: Goal set today Pt Will Perform Toileting - Clothing Manipulation: with modified independence;Standing ADL Goal: Toileting - Clothing Manipulation - Progress: Goal set today Pt Will Perform Tub/Shower Transfer: with modified independence;Tub transfer ADL Goal:  Tub/Shower Transfer - Progress: Goal set today  Additional ADL Goal #1: Pt will demonstrate ability to engage in self care tasks for 20 minutes without a rest break.  ADL Goal: Additional Goal #1 - Progress: Goal set today  Visit Information  Last OT Received On: 06/04/12 Assistance Needed: +1 PT/OT Co-Evaluation/Treatment: Yes    Subjective Data  Subjective: I am tired Patient Stated Goal: none stated. with encouragement, agreeable to get up with therapy   Prior Functioning  Home Living Lives With: Alone Type of Home: Apartment Home Access: Stairs to enter Entrance Stairs-Number of Steps: 1 small slap of concrete Entrance Stairs-Rails: None Home Layout: One level Bathroom Shower/Tub: Chiropodist: Standard Home Adaptive Equipment: Quad cane Additional Comments: uses quad cane when she goes out of apt and sometimes inside apartment Prior Function Level of Independence: Independent with assistive device(s) Able to Take Stairs?: Yes Driving: Yes Communication Communication: No difficulties Dominant Hand: Left    Cognition  Overall Cognitive Status: Appears within functional limits for tasks assessed/performed Arousal/Alertness: Awake/alert Orientation Level: Appears intact for tasks assessed Behavior During Session: Agitated    Extremity/Trunk Assessment Right Upper Extremity Assessment RUE ROM/Strength/Tone: Within functional levels RUE Sensation: WFL - Light Touch Left Upper Extremity Assessment LUE ROM/Strength/Tone: Within functional levels LUE Sensation: Deficits LUE Sensation Deficits: pt states 2nd -5th fingertips feel very slightly numb but much improved since admission. Right Lower Extremity Assessment RLE ROM/Strength/Tone: Within functional levels RLE Sensation: WFL - Light Touch RLE Coordination: WFL - gross/fine motor Left Lower Extremity Assessment LLE ROM/Strength/Tone: Deficits LLE ROM/Strength/Tone Deficits: hip flex 4/5, knee  flex/ext, ankle PF/DF 5/5 LLE Sensation: WFL - Light Touch LLE Coordination: WFL - gross/fine motor Trunk Assessment Trunk Assessment: Normal   Mobility Bed Mobility Bed Mobility: Supine to Sit;Sitting - Scoot to Edge of Bed Supine to Sit: 5: Supervision Sitting - Scoot to Edge of Bed: 5: Supervision Details for Bed Mobility Assistance: Supervision and cues for safety.   Transfers Sit to Stand: 5: Supervision;With upper extremity assist;From bed Stand to Sit: 5: Supervision;With upper extremity assist;To bed Details for Transfer Assistance: Supervision and cues for safety and hand placement.  Pt insisted on not wearing socks or slippers for mobility with PT/OT strongly educating pt on importance of wearing socks or slippers to aid in fall prevention.    Exercise    Balance    End of Session OT - End of Session Activity Tolerance: Patient limited by fatigue Patient left: in bed;with call bell/phone within reach   Jules Schick [id number] 06/04/2012, 11:08 AM

## 2012-06-04 NOTE — Evaluation (Signed)
Physical Therapy Evaluation Patient Details Name: Renee Griffin MRN: [id number] DOB: 07/14/1956 Today's Date: 06/04/2012 Time: [id number] PT Time Calculation (min): 22 min  PT Assessment / Plan / Recommendation Clinical Impression  Pt presents with "subcentimeter areas of acute infarction in the left cerebellar cortex and left pons" with history of 3 previous strokes, with some residual left sided weakness per pt report.  Per MMT, all motions > or equal to 4/5.  Presents today with agitation to request of ambulation in hallway, however finally agreed to short walk.  Noted some instability, putting pt at fall risk.  Educated pt on importance of calling for help when using restroom to prevent fall.  Pt will benefit from skilled PT in acute setting to address deficits.  PT recommends HHPT for follow up, however pt refused.  Pt may also benefit from psych consult.  During session, pt relayed that she had many problems and that "we just don't know."      PT Assessment  Patient needs continued PT services    Follow Up Recommendations  Home health PT (pt refused)    Barriers to Discharge Decreased caregiver support      lEquipment Recommendations  None recommended by PT    Recommendations for Other Services     Frequency Min 3X/week    Precautions / Restrictions Precautions Precautions: Fall Restrictions Weight Bearing Restrictions: No   Pertinent Vitals/Pain Pt with c/o pain in low back      Mobility  Bed Mobility Bed Mobility: Supine to Sit;Sitting - Scoot to Edge of Bed Supine to Sit: 5: Supervision Sitting - Scoot to Edge of Bed: 5: Supervision Details for Bed Mobility Assistance: Supervision and cues for safety.   Transfers Transfers: Sit to Stand;Stand to Sit Sit to Stand: 5: Supervision;With upper extremity assist;From bed Stand to Sit: 5: Supervision;With upper extremity assist;To bed Details for Transfer Assistance: Supervision and cues for safety and hand placement.  Pt  insisted on not wearing socks or slippers for mobility with PT/OT strongly educating pt on importance of wearing socks or slippers to aid in fall prevention.  Ambulation/Gait Ambulation/Gait Assistance: 4: Min guard Ambulation Distance (Feet): 90 Feet Assistive device: Small based quad cane Ambulation/Gait Assistance Details: Cues for safety.  Suggested that pt use quad cane on right side of body since left side is weak, however pt insisted on maintaining cane on left.  Gait Pattern: Step-through pattern;Decreased stride length;Antalgic (decreased arm swing on right) Gait velocity: decreased Stairs: No Wheelchair Mobility Wheelchair Mobility: No Modified Rankin (Stroke Patients Only) Modified Rankin: Moderately severe disability    Exercises     PT Diagnosis: Difficulty walking;Abnormality of gait;Generalized weakness;Acute pain  PT Problem List: Decreased strength;Decreased activity tolerance;Decreased balance;Decreased mobility;Pain;Decreased safety awareness PT Treatment Interventions: DME instruction;Gait training;Stair training;Functional mobility training;Therapeutic activities;Therapeutic exercise;Balance training;Patient/family education   PT Goals Acute Rehab PT Goals PT Goal Formulation: With patient Time For Goal Achievement: 06/11/12 Potential to Achieve Goals: Good Pt will Ambulate: >150 feet;with modified independence;with least restrictive assistive device PT Goal: Ambulate - Progress: Goal set today Pt will Go Up / Down Stairs: 1-2 stairs;with modified independence;with least restrictive assistive device PT Goal: Up/Down Stairs - Progress: Goal set today Pt will Perform Home Exercise Program: Independently PT Goal: Perform Home Exercise Program - Progress: Goal set today  Visit Information  Last PT Received On: 06/04/12 Assistance Needed: +1    Subjective Data  Subjective: I'm really sorry but I'm just tired and weak Patient Stated Goal: to get out of  here     Prior Functioning  Home Living Lives With: Alone Type of Home: Apartment Home Access: Stairs to enter Entrance Stairs-Number of Steps: 1 small slap of concrete Entrance Stairs-Rails: None Home Layout: One level Bathroom Shower/Tub: Chiropodist: Standard Home Adaptive Equipment: Quad cane Additional Comments: uses quad cane when she goes out of apt and sometimes inside apartment Prior Function Level of Independence: Independent with assistive device(s) Able to Take Stairs?: Yes Driving: Yes Communication Communication: No difficulties Dominant Hand: Left    Cognition  Overall Cognitive Status: Appears within functional limits for tasks assessed/performed Arousal/Alertness: Awake/alert Orientation Level: Appears intact for tasks assessed Behavior During Session: Agitated    Extremity/Trunk Assessment Right Lower Extremity Assessment RLE ROM/Strength/Tone: Within functional levels RLE Sensation: WFL - Light Touch RLE Coordination: WFL - gross/fine motor Left Lower Extremity Assessment LLE ROM/Strength/Tone: Deficits LLE ROM/Strength/Tone Deficits: hip flex 4/5, knee flex/ext, ankle PF/DF 5/5 LLE Sensation: WFL - Light Touch LLE Coordination: WFL - gross/fine motor Trunk Assessment Trunk Assessment: Normal   Balance    End of Session PT - End of Session Activity Tolerance: Patient limited by fatigue;Patient limited by pain Patient left: in bed;with call bell/phone within reach Nurse Communication: Mobility status   Page, Betha Loa 06/04/2012, 10:45 AM

## 2012-11-12 ENCOUNTER — Other Ambulatory Visit: Payer: Self-pay | Admitting: Nurse Practitioner

## 2012-11-12 DIAGNOSIS — Z780 Asymptomatic menopausal state: Secondary | ICD-10-CM

## 2012-11-20 ENCOUNTER — Other Ambulatory Visit: Payer: Medicaid Other

## 2013-01-09 ENCOUNTER — Other Ambulatory Visit: Payer: Self-pay | Admitting: Nurse Practitioner

## 2013-01-09 ENCOUNTER — Other Ambulatory Visit: Payer: Self-pay | Admitting: Family Medicine

## 2013-01-09 DIAGNOSIS — Z1231 Encounter for screening mammogram for malignant neoplasm of breast: Secondary | ICD-10-CM

## 2013-01-15 ENCOUNTER — Other Ambulatory Visit: Payer: Self-pay | Admitting: Endocrinology

## 2013-01-15 DIAGNOSIS — E213 Hyperparathyroidism, unspecified: Secondary | ICD-10-CM

## 2013-01-23 ENCOUNTER — Encounter (HOSPITAL_COMMUNITY): Payer: Medicaid Other

## 2013-01-29 ENCOUNTER — Encounter (HOSPITAL_COMMUNITY)
Admission: RE | Admit: 2013-01-29 | Discharge: 2013-01-29 | Disposition: A | Discharge status: Home / Self Care | Payer: Medicaid Other | Source: Ambulatory Visit | Attending: Endocrinology | Admitting: Endocrinology

## 2013-01-29 DIAGNOSIS — E213 Hyperparathyroidism, unspecified: Secondary | ICD-10-CM

## 2013-01-29 MED ORDER — TECHNETIUM TC 99M SESTAMIBI GENERIC - CARDIOLITE
25.0000 | Freq: Once | INTRAVENOUS | Status: AC | PRN
  Administered 2013-01-29: 25 via INTRAVENOUS

## 2013-02-05 ENCOUNTER — Ambulatory Visit
Admission: RE | Admit: 2013-02-05 | Discharge: 2013-02-05 | Disposition: A | Discharge status: Home / Self Care | Payer: Medicaid Other | Source: Ambulatory Visit | Attending: Nurse Practitioner | Admitting: Nurse Practitioner

## 2013-02-05 ENCOUNTER — Ambulatory Visit
Admission: RE | Admit: 2013-02-05 | Discharge: 2013-02-05 | Disposition: A | Discharge status: Home / Self Care | Payer: Medicaid Other | Source: Ambulatory Visit | Attending: Family Medicine | Admitting: Family Medicine

## 2013-02-05 DIAGNOSIS — Z1231 Encounter for screening mammogram for malignant neoplasm of breast: Secondary | ICD-10-CM

## 2013-02-05 DIAGNOSIS — Z780 Asymptomatic menopausal state: Secondary | ICD-10-CM

## 2013-02-06 ENCOUNTER — Encounter: Payer: Self-pay | Admitting: Neurology

## 2013-02-06 DIAGNOSIS — R209 Unspecified disturbances of skin sensation: Secondary | ICD-10-CM

## 2013-02-06 DIAGNOSIS — R269 Unspecified abnormalities of gait and mobility: Secondary | ICD-10-CM

## 2013-02-06 DIAGNOSIS — R292 Abnormal reflex: Secondary | ICD-10-CM

## 2013-02-06 DIAGNOSIS — I6789 Other cerebrovascular disease: Secondary | ICD-10-CM

## 2013-02-11 ENCOUNTER — Other Ambulatory Visit: Payer: Self-pay | Admitting: Family Medicine

## 2013-02-11 DIAGNOSIS — R928 Other abnormal and inconclusive findings on diagnostic imaging of breast: Secondary | ICD-10-CM

## 2013-02-12 ENCOUNTER — Ambulatory Visit (INDEPENDENT_AMBULATORY_CARE_PROVIDER_SITE_OTHER): Payer: Medicaid Other | Admitting: Surgery

## 2013-02-13 ENCOUNTER — Ambulatory Visit (INDEPENDENT_AMBULATORY_CARE_PROVIDER_SITE_OTHER): Payer: Medicaid Other | Admitting: Surgery

## 2013-02-13 ENCOUNTER — Encounter (INDEPENDENT_AMBULATORY_CARE_PROVIDER_SITE_OTHER): Payer: Self-pay | Admitting: Surgery

## 2013-02-13 ENCOUNTER — Encounter (INDEPENDENT_AMBULATORY_CARE_PROVIDER_SITE_OTHER): Payer: Self-pay

## 2013-02-13 VITALS — BP 146/66 | HR 60 | Temp 97.0°F | Resp 16 | Ht 63.0 in | Wt 164.4 lb

## 2013-02-13 DIAGNOSIS — E210 Primary hyperparathyroidism: Secondary | ICD-10-CM

## 2013-02-13 NOTE — Progress Notes (Signed)
General Surgery [facility]  Chief Complaint  Patient presents with  . New Evaluation    eval primary hyperparathyroidism - referral from Dr. Elayne Snare    HISTORY: Patient is a 57 year old white female with multiple medical problems. She has been noted to have elevated serum calcium levels ranging as high as 11.9. She was referred to endocrinology. Additional workup showed an elevated parathyroid hormone level of 80. Patient then underwent nuclear medicine parathyroid scan which localized activity to the left superior position suspicious for a parathyroid adenoma. Patient is now referred to surgery for consideration for parathyroidectomy.  Patient denies nephrolithiasis. She denies fatigue. She does note some loss of height and spine disease. I do not have any record of bone density scanning.  Past medical history is significant for multiple cerebrovascular accidents for which she is taking Plavix. She has also undergone previous open heart surgery with valve replacement, performed in [place], [place].  Past Medical History  Diagnosis Date  . Hypertension   . Depression   . Panic attack   . Stroke   . High cholesterol   . Heart disease, congenital      Current Outpatient Prescriptions  Medication Sig Dispense Refill  . ALPRAZolam (XANAX) 1 MG tablet Take 1 mg by mouth at bedtime as needed for sleep.      Marland Kitchen ascorbic acid (VITAMIN C) 500 MG tablet Take 500 mg by mouth daily.      . cloNIDine (CATAPRES) 0.3 MG tablet Take 1 tablet (0.3 mg total) by mouth 3 (three) times daily.  90 tablet  1  . clopidogrel (PLAVIX) 75 MG tablet Take 1 tablet (75 mg total) by mouth daily.  30 tablet  1  . gemfibrozil (LOPID) 600 MG tablet Take 600 mg by mouth 2 (two) times daily before a meal.      . hydrochlorothiazide (HYDRODIURIL) 25 MG tablet Take 25 mg by mouth daily.      Marland Kitchen lisinopril-hydrochlorothiazide (PRINZIDE,ZESTORETIC) 20-25 MG per tablet Take 1 tablet by mouth  daily.  30 tablet  1  . metoprolol tartrate (LOPRESSOR) 25 MG tablet Take 1 tablet (25 mg total) by mouth 2 (two) times daily.  60 tablet  1  . pravastatin (PRAVACHOL) 40 MG tablet Take 40 mg by mouth daily.      . sertraline (ZOLOFT) 50 MG tablet Take 50 mg by mouth daily.      Marland Kitchen zinc gluconate 50 MG tablet Take 50 mg by mouth daily.       No current facility-administered medications for this visit.     No Known Allergies   Family History  Problem Relation Age of Onset  . Cancer Mother     uterian OR cervical  . Cancer Father     lung  . Cancer Sister     breast     History   Social History  . Marital Status: Widowed    Spouse Name: N/A    Number of Children: N/A  . Years of Education: N/A   Social History Main Topics  . Smoking status: Current Every Day Smoker -- 1.00 packs/day for 50 years    Types: Cigarettes  . Smokeless tobacco: None  . Alcohol Use: No  . Drug Use: No  . Sexually Active: None   Other Topics Concern  . None   Social History Narrative  . None     REVIEW OF SYSTEMS - PERTINENT POSITIVES ONLY: Denies tremor. Denies palpitations. Denies fatigue. Denies nephrolithiasis.  EXAM: Filed Vitals:   02/13/13 1113  BP: 146/66  Pulse: 60  Temp: 97 F (36.1 C)  Resp: 16    HEENT: normocephalic; pupils equal and reactive; sclerae clear; dentition fair; mucous membranes moist NECK:  No palpable masses in the thyroid bed; symmetric on extension; no palpable anterior or posterior cervical lymphadenopathy; no supraclavicular masses; no tenderness CHEST: clear to auscultation bilaterally without rales, rhonchi, or wheezes CARDIAC: regular rate and rhythm without significant murmur; peripheral pulses are full; crisp valve sounds EXT:  non-tender without edema; no deformity NEURO: no gross focal deficits; no sign of tremor   LABORATORY RESULTS: See [facility] (CHL-Epic) for most recent results   RADIOLOGY RESULTS: See [facility]  (CHL-Epic) for most recent results   IMPRESSION: #1 primary hyperparathyroidism, likely left superior parathyroid adenoma #2 history of cardiac surgery with valve replacement #3 history of cerebrovascular accident, on anticoagulations with Plavix  PLAN: I discussed the above findings at length with the patient and her husband. We reviewed the laboratory studies and results of the nuclear medicine parathyroid scan. I have recommended that she undergo a minimally invasive parathyroidectomy procedure for removal of the left superior parathyroid gland. We have discussed the surgery. We have discussed the option of performing this as an outpatient procedure or as a procedure with an overnight stay. We have discussed the risk of the procedure including the risk of bleeding and the risk of injury to the laryngeal nerves.  Prior to scheduling the patient for any procedure, she will need to be evaluated by cardiology and obtain cardiac clearance for surgery. We will also need advice on antibiotic prophylaxis and management of her anti-coagulation in the perioperative interval.  Patient will also require clearance by neurology before general anesthesia and any surgical procedure. We will request guidance regarding her anti-coagulation from neurology as well. We will send a request.  Once the patient has been evaluated by cardiology and neurology, we will then consider scheduling her for surgery and coordinating a consultation with anesthesiology.  The risks and benefits of the procedure have been discussed at length with the patient.  The patient understands the proposed procedure, potential alternative treatments, and the course of recovery to be expected.  All of the patient's questions have been answered at this time.  The patient wishes to proceed with evaluation for future surgery.  Earnstine Regal, MD, [facility]   Visit Diagnoses: 1.  Hyperparathyroidism, primary     Primary Care Physician: Rachell Cipro, MD

## 2013-02-13 NOTE — Patient Instructions (Signed)
Parathyroidectomy A parathyroidectomy is surgery to remove one or more parathyroid glands. These glands produce a hormone (parathyroid hormone) that helps control the level of calcium in your body. The glands are very small, about the size of a pea. They are located in your neck, close to your thyroid gland and your Adam's apple. Most people (85%) have four parathyroid glands,some people may have one or two more than that. Hyperparathyroidism is when too much parathyroid hormone is being produced. Usually this is caused by one of the parathyroid glands becoming enlarged, but it can also be caused by more than one of the glands. Hyperparathyroidism is found during blood tests that show high calcium in the blood. Parathyroid hormone levels will also be elevated. Cancer also can cause hyperparathyroidism, but this is rare. For the most common type of hyperparathyroidism, the treatment is surgical removal of the parathyroid gland that is enlarged. For patients with kidney failure and hyperparathyroidism, other treatment will be tried before surgery is done on the parathyroid.  Many times x-ray studies are done to find out which parathyroid gland or glands is malfunctioning. The decision about the best treatment for hyperparathyroidism is between the patient, their primary doctor, an endocrinologist, and a surgeon experienced in parathyroid surgery. LET YOUR CAREGIVER KNOW ABOUT:  Any allergies.  All medications you are taking, including:  Herbs, eyedrops, over-the-counter medications and creams.  Blood thinners (anticoagulants), aspirin or other drugs that could affect blood clotting.  Use of steroids (by mouth or as creams).  Previous problems with anesthetics, including local anesthetics.  Possibility of pregnancy, if this applies.  Any history of blood clots.  Any history of bleeding or other blood problems.  Previous surgery.  Smoking history.  Other health problems. RISKS AND  COMPLICATIONS   Short-term possibilities include:  Excessive bleeding.  Pain.  Infection near the incision.  Slow healing.  Pooling of blood under the wound (hematoma).  Damage to nerves in your neck.  Blood clots.  Difficulty breathing. This is very rare. It also is almost always temporary.  Longer-term possibilities include:  Scarring.  Skin damage.  Damage to blood vessels in the area.  Need for additional surgery.  A hoarse or weak voice. This is usually temporary. It can be the result of nerve damage.  Development of hypoparathyroidism. This means you are not making enough parathyroid hormone. It is rare. If it occurs, you will need to take calcium supplements daily. BEFORE THE PROCEDURE  Sometimes the surgery is done on an outpatient basis. This means you could go home the same day as your surgery. Other times, people need to stay in the hospital overnight. Ask your surgeon what you should expect.  If your surgery will be an outpatient procedure, arrange for someone to drive you home after the surgery.  Two weeks before your surgery, stop using aspirin and non-steroidal anti-inflammatory drugs (NSAID's) for pain relief. This includes prescription drugs and over-the-counter drugs such as ibuprofen and naproxen. Also stop taking vitamin E.  If you take blood-thinners, ask your healthcare provider when you should stop taking them.  Do not eat or drink for about 8 hours before your surgery.  You might be asked to shower or wash with a special antibacterial soap before the procedure.  Arrive at least an hour before the surgery, or whenever your surgeon recommends. This will give you time to check in and fill out any needed paperwork. PROCEDURE  The preparation:  You will change into a hospital gown.  You  will be given an IV. A needle will be inserted in your arm. Medication will be able to flow directly into your body through this needle.  You might be given a  sedative to help you relax.  You will be given a drug that puts you to sleep during the surgery (general anesthetic).  The procedure:  Once you are asleep, the surgeon will make a small cut (incision) in your lower neck. Ask your surgeon where the incision will be.  The surgeon will look for the gland(s) that are not working well. Often a tissue sample from a gland is used to determine this.  Any glands that are not working well will be removed.  The surgeon will close the incision with stitches, often these are hidden under the skin. AFTER THE PROCEDURE  You will stay in a recovery area until the anesthesia has worn off. Your blood pressure and heart rate will be checked.  If your surgery was an outpatient procedure, you will go home the same day.  If you need to stay in the hospital, you will be moved to a hospital room. You will probably stay for two to three days. This will depend on how quickly you recover.  While you are in the hospital, your blood will be tested to check the calcium levels in your body. HOME CARE INSTRUCTIONS   Take any medication that your surgeon prescribes. Follow the directions carefully. Take all of the medication.  Ask your surgeon whether you can take over-the-counter medicines for pain, discomfort or fever. Do not take aspirin unless your healthcare provider says to. Aspirin increases the chances of bleeding.  Do not get the wound wet for the first few days after surgery (or until the surgeon tells you it is OK). SEEK MEDICAL CARE IF:   You notice blood or fluid leaking from the wound, or it becomes red or swollen.  You have trouble breathing.  You have trouble speaking.  You become nauseous or throw up for more than two days after the surgery.  You develop a fever of more than 100.5 F (38.1 C). SEEK IMMEDIATE MEDICAL CARE IF:   Breathing becomes more difficult.  You develop a fever of 102.0 F (38.9 C) or higher. Document Released:  03/10/2009 Document Revised: 03/05/2012 Document Reviewed: 03/10/2009 [facility] Patient Information 2013 [place].

## 2013-02-20 ENCOUNTER — Ambulatory Visit
Admission: RE | Admit: 2013-02-20 | Discharge: 2013-02-20 | Disposition: A | Discharge status: Home / Self Care | Payer: Medicaid Other | Source: Ambulatory Visit | Attending: Family Medicine | Admitting: Family Medicine

## 2013-02-20 DIAGNOSIS — R928 Other abnormal and inconclusive findings on diagnostic imaging of breast: Secondary | ICD-10-CM

## 2013-03-13 ENCOUNTER — Encounter (INDEPENDENT_AMBULATORY_CARE_PROVIDER_SITE_OTHER): Payer: Self-pay

## 2013-05-15 ENCOUNTER — Encounter: Payer: Self-pay | Admitting: *Deleted

## 2013-07-16 ENCOUNTER — Other Ambulatory Visit: Payer: Self-pay | Admitting: Cardiovascular Disease

## 2013-07-16 DIAGNOSIS — N6489 Other specified disorders of breast: Secondary | ICD-10-CM

## 2013-07-23 ENCOUNTER — Emergency Department (INDEPENDENT_AMBULATORY_CARE_PROVIDER_SITE_OTHER)
Admission: EM | Admit: 2013-07-23 | Discharge: 2013-07-23 | Disposition: A | Discharge status: Home / Self Care | Payer: Medicaid Other | Source: Home / Self Care

## 2013-07-23 ENCOUNTER — Encounter (HOSPITAL_COMMUNITY): Payer: Self-pay

## 2013-07-23 DIAGNOSIS — H6122 Impacted cerumen, left ear: Secondary | ICD-10-CM

## 2013-07-23 DIAGNOSIS — H6120 Impacted cerumen, unspecified ear: Secondary | ICD-10-CM

## 2013-07-23 NOTE — ED Provider Notes (Signed)
Renee Griffin is a 57 y.o. female who presents to Urgent Care today for left ear fullness. Patient had her right ear irrigated at a separate urgent care yesterday. At the same time she had a attempt at cerumen removal of her left ear. This was done with irrigation and a curette. However her symptoms worsened following the attempt at removal. She notes left ear fullness and pain. She denies any fevers or chills. She does note some decreased hearing. The pain is mild to moderate. She has not tried any medications yet. She has not been to an ear nose and throat doctor for this.    PMH reviewed. Hypertension History  Substance Use Topics  . Smoking status: Current Every Day Smoker -- 1.00 packs/day for 50 years    Types: Cigarettes  . Smokeless tobacco: Not on file  . Alcohol Use: No   ROS as above Medications reviewed. No current facility-administered medications for this encounter.   Current Outpatient Prescriptions  Medication Sig Dispense Refill  . ALPRAZolam (XANAX) 1 MG tablet Take 1 mg by mouth at bedtime as needed for sleep.      Marland Kitchen ascorbic acid (VITAMIN C) 500 MG tablet Take 500 mg by mouth daily.      . cloNIDine (CATAPRES) 0.3 MG tablet Take 1 tablet (0.3 mg total) by mouth 3 (three) times daily.  90 tablet  1  . clopidogrel (PLAVIX) 75 MG tablet Take 1 tablet (75 mg total) by mouth daily.  30 tablet  1  . gemfibrozil (LOPID) 600 MG tablet Take 600 mg by mouth 2 (two) times daily before a meal.      . hydrochlorothiazide (HYDRODIURIL) 25 MG tablet Take 25 mg by mouth daily.      Marland Kitchen lisinopril-hydrochlorothiazide (PRINZIDE,ZESTORETIC) 20-25 MG per tablet Take 1 tablet by mouth daily.  30 tablet  1  . metoprolol tartrate (LOPRESSOR) 25 MG tablet Take 1 tablet (25 mg total) by mouth 2 (two) times daily.  60 tablet  1  . pravastatin (PRAVACHOL) 40 MG tablet Take 40 mg by mouth daily.      . sertraline (ZOLOFT) 50 MG tablet Take 50 mg by mouth daily.      Marland Kitchen zinc gluconate 50 MG tablet  Take 50 mg by mouth daily.        Exam:  BP 146/81  Pulse 74  Temp(Src) 97.9 F (36.6 C) (Oral)  Resp 16  SpO2 100% Gen: Well NAD HEENT: EOMI,  MMM. Right ear canal clear. Left occluded by cerumen.  Lungs: CTABL Nl WOB Heart: RRR no MRG Abd: NABS, NT, ND Exts: Non edematous BL  LE, warm and well perfused.   No results found for this or any previous visit (from the past 24 hour(s)). No results found.  The left ear was successfully irrigated. The tympanic membrane is normal-appearing. Patient had significant relief of her symptoms following irrigation.  Assessment and Plan: 57 y.o. female with left ear cerumen impaction successfully irrigated. Followup with ear nose and throat doctors as needed. Discussed warning signs or symptoms. Please see discharge instructions. Patient expresses understanding.      Gregor Hams, MD 07/23/13 [phone]

## 2013-07-23 NOTE — ED Notes (Signed)
C/o she has been having problems for 2 weeks w her ears. States she had already been to another [facility] in town for same problem, and they had worked on her ears w/o complete resolution of her ear issues

## 2013-07-30 ENCOUNTER — Ambulatory Visit
Admission: RE | Admit: 2013-07-30 | Discharge: 2013-07-30 | Disposition: A | Discharge status: Home / Self Care | Payer: Medicaid Other | Source: Ambulatory Visit | Attending: Cardiovascular Disease | Admitting: Cardiovascular Disease

## 2013-07-30 DIAGNOSIS — N6489 Other specified disorders of breast: Secondary | ICD-10-CM

## 2013-08-23 ENCOUNTER — Ambulatory Visit: Payer: Self-pay | Admitting: Nurse Practitioner

## 2013-09-10 ENCOUNTER — Telehealth: Payer: Self-pay | Admitting: Nurse Practitioner

## 2013-09-10 NOTE — Telephone Encounter (Signed)
Call pt about her appt. And the pt acknowledge that she was coming.

## 2013-09-11 ENCOUNTER — Ambulatory Visit (INDEPENDENT_AMBULATORY_CARE_PROVIDER_SITE_OTHER): Payer: Medicaid Other | Admitting: Nurse Practitioner

## 2013-09-11 ENCOUNTER — Encounter: Payer: Self-pay | Admitting: Nurse Practitioner

## 2013-09-11 VITALS — BP 154/100 | HR 64 | Temp 97.9°F | Ht 62.5 in | Wt 167.0 lb

## 2013-09-11 DIAGNOSIS — F411 Generalized anxiety disorder: Secondary | ICD-10-CM

## 2013-09-11 DIAGNOSIS — I10 Essential (primary) hypertension: Secondary | ICD-10-CM

## 2013-09-11 DIAGNOSIS — G4700 Insomnia, unspecified: Secondary | ICD-10-CM

## 2013-09-11 DIAGNOSIS — R269 Unspecified abnormalities of gait and mobility: Secondary | ICD-10-CM

## 2013-09-11 DIAGNOSIS — I6789 Other cerebrovascular disease: Secondary | ICD-10-CM

## 2013-09-11 DIAGNOSIS — F17200 Nicotine dependence, unspecified, uncomplicated: Secondary | ICD-10-CM

## 2013-09-11 MED ORDER — ALPRAZOLAM 1 MG PO TABS: 1.0000 mg | ORAL_TABLET | Freq: Every evening | ORAL | Status: DC | PRN

## 2013-09-11 MED ORDER — ACETAMINOPHEN-CODEINE #3 300-30 MG PO TABS: 1.0000 | ORAL_TABLET | ORAL | Status: DC | PRN

## 2013-09-11 NOTE — Patient Instructions (Signed)
Continue clopidogrel 75 mg orally every day  for secondary stroke prevention and maintain strict control of hypertension with blood pressure goal below 130/90, diabetes with hemoglobin A1c goal below 6.5% and lipids with LDL cholesterol goal below 100 mg/dL. Followup in the future with me in 6 months.  Come back for Carotid Doppler Ultrasound study.  Our office will call to schedule this.  We are referring you to [facility], someone will cal you with appointment information.  [facility] requested.

## 2013-09-11 NOTE — Progress Notes (Signed)
[facility]  PATIENT: Renee Griffin DOB: 1956-06-20   REASON FOR VISIT: follow up HISTORY FROM: patient  HISTORY OF PRESENT ILLNESS: PRIOR 01/21/13 (PS): 56 year Caucasian lady who with tumor history of paresthesias in hands and feet of unclear etiology possibly related to compressive cervical myelopathy. Recent MRI scan on 06/03/12 shows tiny subcentimeter left cerebellar and pontine acute infarcts which are likely silent. History of prior lacunar infarct in 2009. Vascular risk factors of diabetes, Hyperlipidemia hypertension, prior stroke. chronic gait difficulty likely multifactorial from combination of strokes and degenerative arthritis.  She returns for followup after last visit on 10/03/12. She continues to do well without any recurrent stroke or TIA symptoms. She continues to have mild rigidity with walking and dragging of the left leg but this appears to be unchanged. She states her blood pressure is usually quite well controlled but it is elevated in office today at 160/110. She had last lipid profile checked in December by her primary physician and it was fine. She remains on clopidogrel and is tolerating it well without side effects. She has no new complaints today.  UPDATE 09/11/13 (LL): Patient returns to office for stroke followup.  She has been doing well by report.  She no longer has a PCP.  She states she had tried to get an appointment with [facility] but was told they were not accepting new patients.  She states her BP is usually well controlled when she takes it, it is elevated in office today, 154/100.  She is taking Plavix daily but also states she is taking aspirin often because she is out of Tylenol 3.  She states that she has bruises all the time and if she starts bleeding it takes a long time to stop it.  She has no new neurological complaints.  REVIEW OF SYSTEMS: Full 14 system review of systems performed and notable only for:    Constitutional: N/A  Cardiovascular: swelling in ankles  Ear/Nose/Throat: N/A  Skin: N/A  Eyes: N/A  Respiratory: N/A  Gastroitestinal: N/A  Genitourinary: N/A Hematology/Lymphatic: easy bruising, easy bleeding  Endocrine: N/A Musculoskeletal: aching muscles Allergy/Immunology: N/A  Neurological: N/A Psychiatric: anxiety Sleep: N/A   ALLERGIES: No Known Allergies  HOME MEDICATIONS: Outpatient Prescriptions Prior to Visit  Medication Sig Dispense Refill  . ascorbic acid (VITAMIN C) 500 MG tablet Take 500 mg by mouth daily.      . cloNIDine (CATAPRES) 0.3 MG tablet Take 1 tablet (0.3 mg total) by mouth 3 (three) times daily.  90 tablet  1  . clopidogrel (PLAVIX) 75 MG tablet Take 1 tablet (75 mg total) by mouth daily.  30 tablet  1  . gemfibrozil (LOPID) 600 MG tablet Take 600 mg by mouth 2 (two) times daily before a meal.      . hydrochlorothiazide (HYDRODIURIL) 25 MG tablet Take 25 mg by mouth daily.      Marland Kitchen lisinopril-hydrochlorothiazide (PRINZIDE,ZESTORETIC) 20-25 MG per tablet Take 1 tablet by mouth daily.  30 tablet  1  . metoprolol tartrate (LOPRESSOR) 25 MG tablet Take 1 tablet (25 mg total) by mouth 2 (two) times daily.  60 tablet  1  . pravastatin (PRAVACHOL) 40 MG tablet Take 40 mg by mouth daily.      . sertraline (ZOLOFT) 50 MG tablet Take 50 mg by mouth daily.      Marland Kitchen zinc gluconate 50 MG tablet Take 50 mg by mouth daily.      Marland Kitchen ALPRAZolam (XANAX) 1 MG  tablet Take 1 mg by mouth at bedtime as needed for sleep.       No facility-administered medications prior to visit.    PAST MEDICAL HISTORY: Past Medical History  Diagnosis Date  . Hypertension   . Depression   . Panic attack   . Stroke   . High cholesterol   . Heart disease, congenital     PAST SURGICAL HISTORY: Past Surgical History  Procedure Laterality Date  . Cardiac surgery  2011    CABG  . Wisdom tooth extraction      FAMILY HISTORY: Family History  Problem Relation Age of Onset  . Cancer  Mother     uterian OR cervical  . Cancer Father     lung  . Cancer Sister     breast    SOCIAL HISTORY: History   Social History  . Marital Status: Widowed    Spouse Name: N/A    Number of Children: N/A  . Years of Education: N/A   Occupational History  . Not on file.   Social History Main Topics  . Smoking status: Current Every Day Smoker -- 1.00 packs/day for 50 years    Types: Cigarettes  . Smokeless tobacco: Not on file  . Alcohol Use: No  . Drug Use: No  . Sexual Activity: Not on file   Other Topics Concern  . Not on file   Social History Narrative  . No narrative on file     PHYSICAL EXAM  Filed Vitals:   09/11/13 1528  BP: 154/100  Pulse: 64  Temp: 97.9 F (36.6 C)  TempSrc: Oral  Height: 5' 2.5" (1.588 m)  Weight: 167 lb (75.751 kg)   Body mass index is 30.04 kg/(m^2).  Physical Exam  General: Pleasant anxious middle aged Caucasian lady, in no distress.  Afebrile.   Head: nontraumatic Ears, Nose and Throat: Hearing is normal.  Neck: supple without bruit Respiratory: clear to auscultation Cardiovascular:  soft aortic ejection murmur. no gallop Musculoskeletal: no deformity Skin: midline surgical scar on chest  Neurologic Exam  Mental Status: Awake, alert and oriented to time, place and person.  Speech and language appear normal.   Anxious.   Cranial Nerves: Eye movements are full range without nystagmus.  Fundi not visualized.  Visual fields are full to confrontational testing.  Face is symmetric without weakness.  Tongue is midline. Hearing is normal. Motor: reveals  Mild left grip weakness. Diminished fine finger movements on the left. Orbits right over left upper Deep tendon flexes are symmetric and brisker on the left compared to the right. Tone is slightly increased on the left compared to the right. Sensory: Touch and pinprick sensations are normal.   Coordination: normal Gait and Station: steady gait  with slight dragging of the left leg  and unable to do  tandem walking Reflexes: Deep tendon reflexes are 2+ asymmetric brsoker on left.  Plantars left is upgoing and right is downgoing.   DIAGNOSTIC DATA (LABS, IMAGING, TESTING) - I reviewed patient records, labs, notes, testing and imaging myself where available.  Lab Results  Component Value Date   WBC 9.8 06/03/2012   HGB 14.3 06/03/2012   HCT 43.0 06/03/2012   MCV 88.8 06/03/2012   PLT 335 06/03/2012      Component Value Date/Time   NA 140 06/03/2012 0505   K 3.0* 06/03/2012 0505   CL 102 06/03/2012 0505   CO2 26 06/03/2012 0505   GLUCOSE 111* 06/03/2012 0505   BUN 19  06/03/2012 0505   CREATININE 1.00 06/03/2012 0505   CALCIUM 10.8* 06/03/2012 0505   GFRNONAA 62* 06/03/2012 0505   GFRAA 72* 06/03/2012 0505   Lab Results  Component Value Date   CHOL 202* 06/04/2012   HDL 27* 06/04/2012   LDLCALC UNABLE TO CALCULATE IF TRIGLYCERIDE OVER 400 mg/dL 06/04/2012   TRIG 674* 06/04/2012   CHOLHDL 7.5 06/04/2012   Lab Results  Component Value Date   HGBA1C 6.1* 06/04/2012   No results found for this basename: VITAMINB12   No results found for this basename: TSH   US Carotid Doppler 02/12/13 Negative for hemodynamically significant stenosis bilaterally.  Comments: 21 year Caucasian lady who with  history of paresthesias in hands and feet of unclear etiology possibly related to compressive cervical myelopathy. Recent MRI scan on 06/03/12 shows tiny subcentimeter left cerebellar and pontine acute infarcts which are likely silent. History of prior lacunar infarct in 2009. Vascular risk factors of diabetes, Hyperlipidemia hypertension, prior stroke. chronic gait difficulty likely multifactorial from combination of strokes and degenerative arthritis.  Plan :   1. Continue  Plavix  for secondary stroke prevention    2. Aggressive risk control with strict control of hypertension with blood pressure goal below 120/80. 3. Schedule repeat Carotid Doppler US. 4. Refill Xanax and Tylenol 3 prescriptions for  1 month supply- No refills.  Advised patient that I will not refill these again.  She needs to get established with a new PCP. 5. Refer to [facility].  [facility] or IM requested. 6. Return for followup in 6 months   Orders Placed This Encounter  Procedures  . US Carotid Duplex Bilateral  . Ambulatory referral to [facility]    Meds ordered this encounter  Medications  . ALPRAZolam (XANAX) 1 MG tablet    Sig: Take 1 tablet (1 mg total) by mouth at bedtime as needed for sleep.    Dispense:  30 tablet    Refill:  0    1 month supply, we will not refill. Requesting patient get established with a primary care provider for this in the future.    Order Specific Question:  Supervising Provider    Answer:  Garvin Fila [[id number]]  . acetaminophen-codeine (TYLENOL #3) 300-30 MG per tablet    Sig: Take 1 tablet by mouth every 4 (four) hours as needed for pain.    Dispense:  120 tablet    Refill:  0    1 month supply, we will not refill. Requesting patient get established with a primary care provider for this in the future.    Order Specific Question:  Supervising Provider    Answer:  Halford Decamp, MSN, NP-C 09/11/2013, 5:39 PM [facility] [address] [place], [place] [address] [phone]

## 2013-09-19 ENCOUNTER — Ambulatory Visit (INDEPENDENT_AMBULATORY_CARE_PROVIDER_SITE_OTHER): Payer: Medicaid Other

## 2013-09-19 DIAGNOSIS — I6789 Other cerebrovascular disease: Secondary | ICD-10-CM

## 2013-09-19 DIAGNOSIS — I6350 Cerebral infarction due to unspecified occlusion or stenosis of unspecified cerebral artery: Secondary | ICD-10-CM

## 2013-09-25 ENCOUNTER — Telehealth: Payer: Self-pay | Admitting: Nurse Practitioner

## 2013-09-25 NOTE — Telephone Encounter (Signed)
Called Ms. Speth and let her know her carotid doppler results were normal.  She acknowledged the information with thanks.

## 2013-10-03 ENCOUNTER — Telehealth: Payer: Self-pay | Admitting: Nurse Practitioner

## 2013-10-24 NOTE — Telephone Encounter (Signed)
I was very clear with Renee Griffin that we would not refill those prescriptions for her.  Would you please call [facility] and see if you can get her an appointment as a new patient.  Surely they have someone accepting new patients. Thanks.

## 2013-10-24 NOTE — Telephone Encounter (Signed)
I called pt to f/u on her message about refills and referral for pcp.  Pt stated she has tried to get pcp at [facility] and was told flat out no.  She had seen Dr. Dwyane Dee, endocrinolgy and he is IM.  I told her that he can act as pcp.  I gave her [facility] as an option.  She asked if we could refill her meds (hydrocodone and xanax) and I told her that she was told when in the office with L Lam, NP that when given refill for month , was told no more refills.  I relayed this to her.   Offered to call Dr. Dwyane Dee and then again [facility] , and she hung up.

## 2013-10-30 NOTE — Telephone Encounter (Signed)
I called and spoke to [facility].  They are not accepting new pts. with Medicaid.

## 2013-11-29 ENCOUNTER — Emergency Department (INDEPENDENT_AMBULATORY_CARE_PROVIDER_SITE_OTHER)
Admission: EM | Admit: 2013-11-29 | Discharge: 2013-11-29 | Disposition: A | Discharge status: Home / Self Care | Payer: Medicare Other | Source: Home / Self Care | Attending: Emergency Medicine | Admitting: Emergency Medicine

## 2013-11-29 ENCOUNTER — Emergency Department (INDEPENDENT_AMBULATORY_CARE_PROVIDER_SITE_OTHER): Payer: Medicare Other

## 2013-11-29 ENCOUNTER — Encounter (HOSPITAL_COMMUNITY): Payer: Self-pay | Admitting: Emergency Medicine

## 2013-11-29 DIAGNOSIS — IMO0002 Reserved for concepts with insufficient information to code with codable children: Secondary | ICD-10-CM

## 2013-11-29 DIAGNOSIS — M47816 Spondylosis without myelopathy or radiculopathy, lumbar region: Secondary | ICD-10-CM

## 2013-11-29 DIAGNOSIS — M47817 Spondylosis without myelopathy or radiculopathy, lumbosacral region: Secondary | ICD-10-CM

## 2013-11-29 MED ORDER — HYDROCODONE-ACETAMINOPHEN 5-325 MG PO TABS: ORAL_TABLET | ORAL | Status: DC

## 2013-11-29 MED ORDER — DEXAMETHASONE SODIUM PHOSPHATE 10 MG/ML IJ SOLN
INTRAMUSCULAR | Status: AC
  Filled 2013-11-29: qty 1

## 2013-11-29 MED ORDER — METHOCARBAMOL 500 MG PO TABS: 500.0000 mg | ORAL_TABLET | Freq: Three times a day (TID) | ORAL | Status: DC

## 2013-11-29 MED ORDER — DEXAMETHASONE SODIUM PHOSPHATE 10 MG/ML IJ SOLN
10.0000 mg | Freq: Once | INTRAMUSCULAR | Status: AC
  Administered 2013-11-29: 10 mg via INTRAMUSCULAR

## 2013-11-29 NOTE — ED Notes (Signed)
Flare up of pain in back x 2 days

## 2013-11-29 NOTE — ED Provider Notes (Signed)
Chief Complaint:   Chief Complaint  Patient presents with  . Back Pain    History of Present Illness:   Renee Griffin is a 57 year old female who has a one-year history of lower back pain. It's been worse the past 2 weeks. She denies any injury. The pain is rated as an 8/10 in intensity. It's worse with walking, standing, or bending, and better if she lies flat. She has been seen by her primary care physician for this and think she has had x-rays which showed arthritis, however I do not have any documentation of any x-rays. The pain is confined to the lower back and there is no radiation to the legs, numbness, tingling, or muscle weakness. No bladder or bowel dysfunction.  Review of Systems:  Other than noted above, the patient denies any of the following symptoms: Systemic:  No fever, chills, severe fatigue, or unexplained weight loss. GI:  No abdominal pain, nausea, vomiting, diarrhea, constipation, incontinence of bowel, or blood in stool. GU:  No dysuria, frequency, urgency, or hematuria. No incontinence of urine or difficulty urinating.  M-S:  No neck pain, joint pain, arthritis, or myalgias. Neuro:  No paresthesias, saddle anesthesia, muscular weakness, or progressive neurological deficit.  [place]:  Past medical history, family history, social history, meds, and allergies were reviewed. Specifically, there is no history of cancer, major trauma, osteoporosis, immunosuppression, or HIV infection. She is on a list of medications including Tylenol No. 3, alprazolam, clonidine, Plavix, Lopid, hydrochlorothiazide, Lopressor, Pravachol, Zoloft, and zinc. She has a history of a valve replacement, stroke, high blood pressure, and she is a cigarette smoker.  Physical Exam:   Vital signs:  BP 130/66  Pulse 61  Temp(Src) 97.3 F (36.3 C) (Oral)  Resp 18  SpO2 100% General:  Alert, oriented, in no distress. Abdomen:  Soft, non-tender.  No organomegaly or mass.  No pulsatile midline abdominal mass  or bruit. Back:  She has 45 of flexion, 5 of extension, 10 of lateral bending, and 20 of rotation with pain. Straight leg raising is negative. Neuro:  Normal muscle strength, sensations. Her knee reflex on the left was 2+, 1+ on the right. Ankle reflex on the left was 2+, unobtainable in the right. (Left side was the side she had a stroke in, so this may account for the asymmetry in reflexes.) Extremities: Pedal pulses were full, there was no edema. Skin:  Clear, warm and dry.  No rash.  Radiology:  Dg Lumbar Spine Complete  11/29/2013   CLINICAL DATA:  Chronic back pain, worse over the past week.  EXAM: LUMBAR SPINE - COMPLETE 4+ VIEW  COMPARISON:  None.  FINDINGS: Five non-rib-bearing lumbar vertebrae. Marked disc space narrowing with discogenic sclerosis and vacuum phenomena at the L1-2 and L3-4 levels. Moderate to marked disc space narrowing at the remaining levels. Facet degenerative changes at multiple levels with grade 1 anterolisthesis at the L2-3 level. Moderately large lateral and anterior spurs at multiple levels. Atheromatous arterial calcifications.  IMPRESSION: Extensive degenerative changes.   Electronically Signed   By: Enrique Sack M.D.   On: 11/29/2013 14:31   Course in Urgent Care Center:   Given Decadron 10 mg IM.  Assessment:  The primary encounter diagnosis was Degenerative disc disease. A diagnosis of Facet arthritis of lumbar region was also pertinent to this visit.  She'll need to followup with her primary care physician. May need referral to an orthopedist and may benefit from facet joint injections.  Plan:   1.  Meds:  The following meds were prescribed:   Discharge Medication List as of 11/29/2013  2:51 PM    START taking these medications   Details  HYDROcodone-acetaminophen (NORCO/VICODIN) 5-325 MG per tablet 1 to 2 tabs every 4 to 6 hours as needed for pain., Print    methocarbamol (ROBAXIN) 500 MG tablet Take 1 tablet (500 mg total) by mouth 3 (three) times  daily., Starting 11/29/2013, Until Discontinued, Normal        2.  Patient Education/Counseling:  The patient was given appropriate handouts, self care instructions, and instructed in symptomatic relief. The patient was encouraged to try to be as active as possible and given some exercises to do followed by moist heat.  3.  Follow up:  The patient was told to follow up if no better in 3 to 4 days, if becoming worse in any way, and given some red flag symptoms such as worsening pain or new neurological symptoms which would prompt immediate return.  Follow up here as needed.     Harden Mo, MD 11/29/13 2209

## 2013-12-22 ENCOUNTER — Encounter (HOSPITAL_COMMUNITY): Payer: Self-pay | Admitting: Emergency Medicine

## 2013-12-22 ENCOUNTER — Emergency Department (INDEPENDENT_AMBULATORY_CARE_PROVIDER_SITE_OTHER)
Admission: EM | Admit: 2013-12-22 | Discharge: 2013-12-22 | Disposition: A | Discharge status: Home / Self Care | Payer: Medicare Other | Source: Home / Self Care | Attending: Emergency Medicine | Admitting: Emergency Medicine

## 2013-12-22 DIAGNOSIS — H6120 Impacted cerumen, unspecified ear: Secondary | ICD-10-CM

## 2013-12-22 DIAGNOSIS — H6122 Impacted cerumen, left ear: Secondary | ICD-10-CM

## 2013-12-22 DIAGNOSIS — M25551 Pain in right hip: Secondary | ICD-10-CM

## 2013-12-22 DIAGNOSIS — I10 Essential (primary) hypertension: Secondary | ICD-10-CM

## 2013-12-22 DIAGNOSIS — M25559 Pain in unspecified hip: Secondary | ICD-10-CM

## 2013-12-22 MED ORDER — SALSALATE 750 MG PO TABS: 750.0000 mg | ORAL_TABLET | Freq: Two times a day (BID) | ORAL | Status: DC

## 2013-12-22 MED ORDER — METHYLPREDNISOLONE ACETATE 80 MG/ML IJ SUSP
INTRAMUSCULAR | Status: AC
  Filled 2013-12-22: qty 1

## 2013-12-22 MED ORDER — TRAMADOL HCL 50 MG PO TABS: 100.0000 mg | ORAL_TABLET | Freq: Three times a day (TID) | ORAL | Status: DC | PRN

## 2013-12-22 MED ORDER — METHYLPREDNISOLONE ACETATE 80 MG/ML IJ SUSP
80.0000 mg | Freq: Once | INTRAMUSCULAR | Status: AC
  Administered 2013-12-22: 80 mg via INTRAMUSCULAR

## 2013-12-22 NOTE — ED Notes (Signed)
Discharge pending ear wax removal

## 2013-12-22 NOTE — ED Provider Notes (Signed)
Chief Complaint:   Chief Complaint  Patient presents with  . Ear Problem  . Hip Pain    History of Present Illness:   Renee Griffin is a 57 year old female who presents today with a six-month history of right hip pain. She states this has been x-rayed and she's been diagnosed with arthritis. Reviewing our x-rays here, she only had a lumbar spine film. She's not sure where the hip was x-rayed. The pain is localized over the lateral hip. It's worse when she lies on that side or walks. She using a cane for ambulation. It does not radiate down the leg, there is no numbness, tingling, weakness in the leg. No bladder or bowel dysfunction. She denies any abdominal pain or lower back pain. She's had no fever, chills, or unintended weight loss. She is taken Robaxin, Tylenol #3, and alprazolam. She requests refills on all 3.  She also has a history of congestion in her left ear. She denies any pain.  Review of Systems:  Other than noted above, the patient denies any of the following symptoms: Systemic:  No fevers, chills, sweats, or aches.  No fatigue or tiredness. Musculoskeletal:  No joint pain, arthritis, bursitis, swelling, back pain, or neck pain. Neurological:  No muscular weakness, paresthesias, headache, or trouble with speech or coordination.  No dizziness.  [place]:  Past medical history, family history, social history, meds, and allergies were reviewed.  Current meds include Tylenol No. 3, Xanax, Catapres, Plavix, gemfibrozil, hydrochlorothiazide, lisinopril/hydrochlorothiazide, Robaxin, metoprolol, pravastatin, sertraline, and zinc. She has a history of hypertension, depression, panic attacks, stroke, high cholesterol, and congenital heart disease.  Physical Exam:   Vital signs:  BP 187/104  Pulse 60  Temp(Src) 97.6 F (36.4 C) (Oral)  Resp 18  SpO2 99% Gen:  Alert and oriented times 3.  In no distress. ENT: She has a cerumen impaction in her left ear canal. Musculoskeletal: Exam of her  hip reveals pain to palpation over the buttock area. There is no pain in the groin or the lateral hip. The hip has a full range of motion with some pain on internal rotation but not external rotation her straight leg raising. Knee and ankle reflexes were normal. Sensation was intact, pulses were full.  Otherwise, all joints had a full a ROM with no swelling, bruising or deformity.  No edema, pulses full. Extremities were warm and pink.  Capillary refill was brisk.  Skin:  Clear, warm and dry.  No rash. Neuro:  Alert and oriented times 3.  Muscle strength was normal.  Sensation was intact to light touch.   Course in Urgent Care Center:   She was given Depo-Medrol 80 mg IM. Her cerumen impaction was irrigated clear, and thereafter she experienced relief. The ear canal and TM are normal.  Assessment:  The primary encounter diagnosis was Hip pain, right. Diagnoses of Impacted cerumen, left and Hypertension were also pertinent to this visit.  Hip pain could be due to osteoarthritis of hip joint or the sacroiliac joint, tendinitis, bursitis, or radicular pain. She will need followup through her primary care physician.  Plan:   1.  Meds:  The following meds were prescribed:   Discharge Medication List as of 12/22/2013  3:31 PM    START taking these medications   Details  salsalate (DISALCID) 750 MG tablet Take 1 tablet (750 mg total) by mouth 2 (two) times daily., Starting 12/22/2013, Until Discontinued, Normal    traMADol (ULTRAM) 50 MG tablet Take 2 tablets (100  mg total) by mouth every 8 (eight) hours as needed., Starting 12/22/2013, Until Discontinued, Normal        2.  Patient Education/Counseling:  The patient was given appropriate handouts, self care instructions, and instructed in symptomatic relief, including rest and activity, elevation, application of ice and compression.   3.  Follow up:  The patient was told to follow up if no better in 3 to 4 days, if becoming worse in any way, and  given some red flag symptoms such as worsening pain or new neurological symptoms which would prompt immediate return.  Follow up with her primary care physician.     Harden Mo, MD 12/22/13 [phone]

## 2013-12-22 NOTE — ED Notes (Signed)
Ear wax build up per patient.  Denies pain, but does report inability to hear in the left ear.  Has used debrox solution and has used peroxide.  Issues for 2-3 days.   Right hip pain, out of medications, patient has chronic hip pain and has been told pain is arthritis.

## 2014-03-11 ENCOUNTER — Ambulatory Visit: Payer: Medicaid Other | Admitting: Neurology

## 2014-03-21 ENCOUNTER — Other Ambulatory Visit: Payer: Self-pay | Admitting: Cardiovascular Disease

## 2014-03-21 DIAGNOSIS — N6489 Other specified disorders of breast: Secondary | ICD-10-CM

## 2014-04-01 ENCOUNTER — Other Ambulatory Visit: Payer: Self-pay | Admitting: Cardiovascular Disease

## 2014-04-01 ENCOUNTER — Ambulatory Visit
Admission: RE | Admit: 2014-04-01 | Discharge: 2014-04-01 | Disposition: A | Discharge status: Home / Self Care | Payer: Medicare Other | Source: Ambulatory Visit | Attending: Cardiovascular Disease | Admitting: Cardiovascular Disease

## 2014-04-01 ENCOUNTER — Ambulatory Visit
Admission: RE | Admit: 2014-04-01 | Discharge: 2014-04-01 | Disposition: A | Discharge status: Home / Self Care | Payer: PRIVATE HEALTH INSURANCE | Source: Ambulatory Visit | Attending: Cardiovascular Disease | Admitting: Cardiovascular Disease

## 2014-04-01 DIAGNOSIS — N6489 Other specified disorders of breast: Secondary | ICD-10-CM

## 2014-06-04 ENCOUNTER — Telehealth: Payer: Self-pay | Admitting: Neurology

## 2014-06-04 ENCOUNTER — Encounter: Payer: Self-pay | Admitting: Neurology

## 2014-06-04 NOTE — Telephone Encounter (Signed)
Spoke to patient regarding rescheduling 06/24/14 per Dr. Clydene Fake schedule, moved to Dr. Erlinda Hong, patient verbalized understanding. Printed and mailed letter with new appointment time.

## 2014-06-24 ENCOUNTER — Ambulatory Visit: Payer: Medicaid Other | Admitting: Neurology

## 2014-07-11 ENCOUNTER — Ambulatory Visit (INDEPENDENT_AMBULATORY_CARE_PROVIDER_SITE_OTHER): Payer: Medicare Other | Admitting: Neurology

## 2014-07-11 ENCOUNTER — Encounter (INDEPENDENT_AMBULATORY_CARE_PROVIDER_SITE_OTHER): Payer: Self-pay

## 2014-07-11 ENCOUNTER — Encounter: Payer: Self-pay | Admitting: Neurology

## 2014-07-11 VITALS — BP 155/87 | HR 69 | Wt 183.0 lb

## 2014-07-11 DIAGNOSIS — I639 Cerebral infarction, unspecified: Secondary | ICD-10-CM

## 2014-07-11 DIAGNOSIS — I6350 Cerebral infarction due to unspecified occlusion or stenosis of unspecified cerebral artery: Secondary | ICD-10-CM

## 2014-07-11 DIAGNOSIS — M545 Low back pain, unspecified: Secondary | ICD-10-CM

## 2014-07-11 DIAGNOSIS — F411 Generalized anxiety disorder: Secondary | ICD-10-CM

## 2014-07-11 DIAGNOSIS — F419 Anxiety disorder, unspecified: Secondary | ICD-10-CM

## 2014-07-11 MED ORDER — ALPRAZOLAM 0.5 MG PO TABS: 0.5000 mg | ORAL_TABLET | Freq: Three times a day (TID) | ORAL | Status: DC | PRN

## 2014-07-11 MED ORDER — ACETAMINOPHEN-CODEINE #3 300-30 MG PO TABS: 1.0000 | ORAL_TABLET | Freq: Four times a day (QID) | ORAL | Status: DC | PRN

## 2014-07-11 NOTE — Patient Instructions (Addendum)
1. Continue plavix and pravastatin for stroke prevention 2. Follow up with PCP on 7/24 for stroke risk factor modification, HTN, HLD, smoking 3. We recommend smoke cessation but you are not ready yet. Whenever you are ready, please let us know 4. Will refill xanax and tylenol 3 for 7 days and then please follow up with your PCP 5. Blood draw today for Lipid panel, TSH and free T4 6. Will schedule carotid doppler repeat.  7. Follow up in 6 months.

## 2014-07-11 NOTE — Progress Notes (Signed)
STROKE NEUROLOGY FOLLOW UP NOTE  NAME: Renee Griffin DOB: 1956-04-21  REASON FOR VISIT: stroke follow up HISTORY FROM: patient and chart  Today we had the pleasure of seeing Renee Griffin in follow-up at our [facility]. Pt was accompanied by no one.   History Summary 2 year Caucasian lady who with past medical history of HTN, depression, Anxiety/Panic attacks, dyslipidemia, 3 previous CVAs, s/p porcine aortic valve placement due to congenital defect, current smoker was admitted 06/02/2012 for paresthesias in hands and feet. MRI scan on 06/03/12 shows tiny subcentimeter left cerebellar and pontine acute infarcts which are likely silent. History of prior lacunar infarct in 2009. Vascular risk factors of smoking, Hyperlipidemia, hypertension, and prior stroke.   01/21/13 (PS): She returns for followup after last visit on 10/03/12. She continues to do well without any recurrent stroke or TIA symptoms. She continues to have mild rigidity with walking and dragging of the left leg but this appears to be unchanged. She states her blood pressure is usually quite well controlled but it is elevated in office today at 160/110. She had last lipid profile checked in December by her primary physician and it was fine. She remains on clopidogrel and is tolerating it well without side effects.   09/11/13 (LL): Patient returns to office for stroke followup. She has been doing well by report. She no longer has a PCP. She states she had tried to get an appointment with [facility] but was told they were not accepting new patients. She states her BP is usually well controlled when she takes it, it is elevated in office today, 154/100. She is taking Plavix daily but also states she is taking aspirin often because she is out of Tylenol 3. She states that she has bruises all the time and if she starts bleeding it takes a long time to stop it. She has no new neurological complaints.   Interval  History During the interval time, the patient has been doing well. No recurrent stroke like symptoms. She continued to smoke, 1PPD, and she has no desire to quit. When brought up the smoking cessation topic, she was very upset and did not want to mention about it. She stated that she still has a lot of stress and her brother is in ICU for bone marrow transplant now, and she needs her xanax for anxiety. She stated that she has been on that medication for 30 years and she did not want to change to longer acting benzo when I recommend to switch to clonazepam. She stated that she had weight gain and she can not do exercise due to LBP and gait difficulty. Her PCP asked her to eat less and she said she already ate less. She has her PCP appointment 7/24.     REVIEW OF SYSTEMS: Full 14 system review of systems performed and notable only for those listed below and in HPI above, all others are negative:  Constitutional: N/A  Cardiovascular: N/A  Ear/Nose/Throat: N/A  Skin: N/A  Eyes: N/A  Respiratory: N/A  Gastroitestinal: N/A  Hematology/Lymphatic: bruise easily  Endocrine: N/A  Musculoskeletal: aching muscles, walking difficulty  Allergy/Immunology: N/A  Neurological: anxious nervous Psychiatric: N/A  The following represents the patient's updated allergies and side effects list: Allergies  Allergen Reactions  . Amoxicillin-Pot Clavulanate Diarrhea  . Bupropion Nausea Only  . Nicotine Nausea Only    Used patches only.    Labs since last visit of relevance include the following: Results for  orders placed during the hospital encounter of 06/03/12  CBC      Result Value Ref Range   WBC 9.8  4.0 - 10.5 K/uL   RBC 4.84  3.87 - 5.11 MIL/uL   Hemoglobin 14.3  12.0 - 15.0 g/dL   HCT 43.0  36.0 - 46.0 %   MCV 88.8  78.0 - 100.0 fL   MCH 29.5  26.0 - 34.0 pg   MCHC 33.3  30.0 - 36.0 g/dL   RDW 14.6  11.5 - 15.5 %   Platelets 335  150 - 400 K/uL  BASIC METABOLIC PANEL      Result Value Ref  Range   Sodium 140  135 - 145 mEq/L   Potassium 3.0 (*) 3.5 - 5.1 mEq/L   Chloride 102  96 - 112 mEq/L   CO2 26  19 - 32 mEq/L   Glucose, Bld 111 (*) 70 - 99 mg/dL   BUN 19  6 - 23 mg/dL   Creatinine, Ser 1.00  0.50 - 1.10 mg/dL   Calcium 10.8 (*) 8.4 - 10.5 mg/dL   GFR calc non Af Amer 62 (*) >90 mL/min   GFR calc Af Amer 72 (*) >90 mL/min  HEMOGLOBIN A1C      Result Value Ref Range   Hemoglobin A1C 6.1 (*) <5.7 %   Mean Plasma Glucose 128 (*) <117 mg/dL  LIPID PANEL      Result Value Ref Range   Cholesterol 202 (*) 0 - 200 mg/dL   Triglycerides 674 (*) <150 mg/dL   HDL 27 (*) >39 mg/dL   Total CHOL/HDL Ratio 7.5     VLDL UNABLE TO CALCULATE IF TRIGLYCERIDE OVER 400 mg/dL  0 - 40 mg/dL   LDL Cholesterol UNABLE TO CALCULATE IF TRIGLYCERIDE OVER 400 mg/dL  0 - 99 mg/dL    The neurologically relevant items on the patient's problem list were reviewed on today's visit.  Neurologic Examination  A problem focused neurological exam (12 or more points of the single system neurologic examination, vital signs counts as 1 point, cranial nerves count for 8 points) was performed.  Blood pressure 155/87, pulse 69, weight 183 lb (83.008 kg).  General - obese, well developed, in no apparent distress.  Ophthalmologic - Sharp disc margins OU.  Cardiovascular - Regular rate and rhythm with no murmur.  Mental Status -  Level of arousal and orientation to time, place, and person were intact. Language including expression, naming, repetition, comprehension, reading, and writing was assessed and found intact. Attention span and concentration were normal. Do not want to do calculation for me Recent and remote memory were 3/3 registration and 2/3 delayed recall. Fund of Knowledge was assessed and was intact.  Cranial Nerves II - XII - II - Vision intact OU. III, IV, VI - Extraocular movements intact. V - Facial sensation intact bilaterally. VII - Facial movement intact bilaterally. VIII -  Hearing & vestibular intact bilaterally. X - Palate elevates symmetrically. XI - Chin turning & shoulder shrug intact bilaterally. XII - Tongue protrusion intact.  Motor Strength - The patient's strength was normal in all extremities and pronator drift was absent.  Bulk was normal and fasciculations were absent.   Motor Tone - Muscle tone was assessed at the neck and appendages and was normal.  Reflexes - The patient's reflexes were normal in all extremities and she had no pathological reflexes.  Sensory - Light touch, temperature/pinprick, and Romberg testing were assessed and were normal.  Coordination - The patient had normal movements in the hands and feet with no ataxia or dysmetria.  Tremor was absent.  Gait and Station - slow cautious gait due to low back pain, not able to do toe/heel walking or tandem gait.  Data reviewed: I personally reviewed the images and agree with the radiology interpretations.  MRI 06/03/12 Subcentimeter areas of restricted diffusion left cerebellar cortex and  left paramedian lower pons consistent with acute infarction.  Atrophy, chronic microvascular ischemic change, numerous lacunes, and scattered microbleeds, all consistent with advanced hypertensive cerebrovascular disease. CTA neck 06/03/12 Previous thoracic surgery presumably for treatment of aortic aneurysm.  No discernible dissection or active process, though the aorta was not the primary target of this examination. Mild atherosclerotic disease at both carotid bifurcations without stenosis, when compared to a more distal cervical internal carotid arteries.  Plaque at the proximal ICA on the right could possibly serve as a source of emboli.  Both cervical internal carotid arteries are small vessels but without focal disease. No acute vertebral pathology evident. No preliminary report, it was noted that the left vertebral artery is somewhat thinned at the C4-5 level, where there is 5 mm of  anterolisthesis, possibly stretching more distorting the vertebral artery.  I think the significance is questionable. CTA HEAD 06/03/12 No intracranial stenosis or occlusion.  Ectasia of the basilar artery. 2D echo 06/04/12 - Left ventricle: There was moderate concentric hypertrophy.   Systolic function was normal. The estimated ejection   fraction was in the range of 60% to 65%. Wall motion was   normal; there were no regional wall motion abnormalities.   Features are consistent with a pseudonormal left   ventricular filling pattern, with concomitant abnormal   relaxation and increased filling pressure (grade 2   diastolic dysfunction). - Mitral valve: Moderately calcified annulus. Mildly   thickened leaflets . Impressions: - No cardiac source of embolism was identified, but cannot   be ruled out on the basis of this examination. Carotid Doppler 06/03/12 Bilateral: mild mixed plaque origin ICA. No significant ICA stenosis. Vertebral artery flow is antegrade. Tortuous vessels. Carotid Doppler 09/19/13 Negative study  Component     Latest Ref Rng 06/04/2012  Cholesterol     0 - 200 mg/dL 202 (H)  Triglycerides     <150 mg/dL 674 (H)  HDL     >39 mg/dL 27 (L)  Total CHOL/HDL Ratio      7.5  VLDL     0 - 40 mg/dL UNABLE TO CALCULATE IF TRIGLYCERIDE OVER 400 mg/dL  LDL (calc)     0 - 99 mg/dL UNABLE TO CALCULATE IF TRIGLYCERIDE OVER 400 mg/dL  Hemoglobin A1C     <5.7 % 6.1 (H)  Mean Plasma Glucose     <117 mg/dL 128 (H)    Assessment: As you may recall, she is a 58 y.o. Caucasian female with a diagnosis of stroke. Her punctate stroke in cerebellar and brainstem are likely small vessel events and she has significant risk factors including HTN, HLD, smoking. She has been following her PCP for risk factor control and stated that she got last blood draw 3 months ago and did not be told about the results. We will check some stroke labs today and repeat carotid doppler. Last CUS was about  one year ago. Her gait difficulty is related to her LBP and no new neuorlogical deficit. She is asking for Xanax and tylenol 3 refill. However, when I recommend long acting benzo, clonazepam, she  became very upset and agitated. She stated that she has been taking it for 30 years. I would ask her to discuss with her PCP regarding long term use of Xanax which I do not recommend. She also needs to follow up with pain management specialist for LBP and management of pain meds. I will give her xanax 0.5mg  tid and tylenol #3 Q6 PRN for one week amount with no refills. She will see her PCP on 7/24 as scheduled. She also refused to talk about smoking cessation.  Plan:  - continue plavix and pravastatin for stroke prevention - today her BP was high but she attributed it to stress and nervousness. She needs better BP control. She will see her PCP on 7/24 for stroke risk modification - recheck lipid panel, TSH and T4  - smoke cessation counseling provided but pt not ready and refused. - only one week worth of xanax and tylenol 3 and she is going to see her PCP in a week - carotid doppler repeat - RTC in 6 months.  Diagnoses from this visit: CVA (cerebral vascular accident) - Plan: TSH + free T4, Lipid panel, US Carotid Bilateral  Anxiety - Plan: ALPRAZolam (XANAX) 0.5 MG tablet  Bilateral low back pain without sciatica - Plan: acetaminophen-codeine (TYLENOL #3) 300-30 MG per tablet  Orders Placed This Encounter  Procedures  . US Carotid Bilateral    Standing Status: Future     Number of Occurrences:      Standing Expiration Date: 09/12/2015    Order Specific Question:  Reason for Exam (SYMPTOM  OR DIAGNOSIS REQUIRED)    Answer:  hx of stroke and current smoker    Order Specific Question:  Preferred imaging location?    Answer:  Internal  . TSH + free T4  . Lipid panel   Meds ordered this encounter  Medications  . DISCONTD: ALPRAZolam (XANAX) 1 MG tablet    Sig: Take 1 mg by mouth 3 (three) times  daily.  Marland Kitchen ALPRAZolam (XANAX) 0.5 MG tablet    Sig: Take 1 tablet (0.5 mg total) by mouth 3 (three) times daily as needed for anxiety.    Dispense:  20 tablet    Refill:  0  . acetaminophen-codeine (TYLENOL #3) 300-30 MG per tablet    Sig: Take 1 tablet by mouth every 6 (six) hours as needed for moderate pain.    Dispense:  30 tablet    Refill:  0   Patient Instructions  1. Continue plavix and pravastatin for stroke prevention 2. Follow up with PCP on 7/24 for stroke risk factor modification, HTN, HLD, smoking 3. We recommend smoke cessation but you are not ready yet. Whenever you are ready, please let us know 4. Will refill xanax and tylenol 3 for 7 days and then please follow up with your PCP 5. Blood draw today for Lipid panel, TSH and free T4 6. Will schedule carotid doppler repeat.  7. Follow up in 6 months.  Rosalin Hawking, MD PhD [facility] [address], [place],  [address] [phone]

## 2014-07-14 ENCOUNTER — Other Ambulatory Visit (INDEPENDENT_AMBULATORY_CARE_PROVIDER_SITE_OTHER): Payer: Self-pay

## 2014-07-14 DIAGNOSIS — Z0289 Encounter for other administrative examinations: Secondary | ICD-10-CM

## 2014-07-15 ENCOUNTER — Other Ambulatory Visit: Payer: Self-pay | Admitting: Neurology

## 2014-07-15 LAB — LIPID PANEL
CHOL/HDL RATIO: 6.6 ratio — AB (ref 0.0–4.4)
Cholesterol, Total: 217 mg/dL — ABNORMAL HIGH (ref 100–199)
HDL: 33 mg/dL — ABNORMAL LOW (ref >39)
Triglycerides: 455 mg/dL — ABNORMAL HIGH (ref 0–149)

## 2014-07-15 LAB — TSH+FREE T4
Free T4: 0.88 ng/dL (ref 0.82–1.77)
TSH: 1.1 u[IU]/mL (ref 0.450–4.500)

## 2014-07-15 MED ORDER — ATORVASTATIN CALCIUM 40 MG PO TABS: 40.0000 mg | ORAL_TABLET | Freq: Every day | ORAL | Status: DC

## 2014-07-15 NOTE — Progress Notes (Signed)
Quick Note:  Shared labs with patient, verbalized understanding and said ok ______

## 2014-07-15 NOTE — Progress Notes (Signed)
Quick Note:  Please call pt and tell her that her cholesterol and triglycerides are still high and I have changed her pravastatin to lipitor to better control her cholesterol. She should discontinue the pravastatin and take the lipitor. She can pick up lipitor at [facility] at [facility]. Thank you. ______

## 2014-07-30 ENCOUNTER — Ambulatory Visit (INDEPENDENT_AMBULATORY_CARE_PROVIDER_SITE_OTHER): Payer: Medicare Other

## 2014-07-30 DIAGNOSIS — I6350 Cerebral infarction due to unspecified occlusion or stenosis of unspecified cerebral artery: Secondary | ICD-10-CM

## 2014-07-30 DIAGNOSIS — I639 Cerebral infarction, unspecified: Secondary | ICD-10-CM

## 2014-11-30 ENCOUNTER — Encounter: Payer: Self-pay | Admitting: *Deleted

## 2015-01-19 ENCOUNTER — Ambulatory Visit: Payer: Medicare Other | Admitting: Neurology

## 2015-03-05 ENCOUNTER — Ambulatory Visit (INDEPENDENT_AMBULATORY_CARE_PROVIDER_SITE_OTHER): Payer: Medicare Other | Admitting: Neurology

## 2015-03-05 ENCOUNTER — Encounter: Payer: Self-pay | Admitting: Neurology

## 2015-03-05 VITALS — BP 140/91 | HR 71 | Ht 62.5 in | Wt 182.8 lb

## 2015-03-05 DIAGNOSIS — I10 Essential (primary) hypertension: Secondary | ICD-10-CM | POA: Insufficient documentation

## 2015-03-05 DIAGNOSIS — M545 Low back pain, unspecified: Secondary | ICD-10-CM | POA: Insufficient documentation

## 2015-03-05 DIAGNOSIS — I639 Cerebral infarction, unspecified: Secondary | ICD-10-CM | POA: Insufficient documentation

## 2015-03-05 DIAGNOSIS — E785 Hyperlipidemia, unspecified: Secondary | ICD-10-CM | POA: Insufficient documentation

## 2015-03-05 DIAGNOSIS — Z720 Tobacco use: Secondary | ICD-10-CM

## 2015-03-05 DIAGNOSIS — F17200 Nicotine dependence, unspecified, uncomplicated: Secondary | ICD-10-CM

## 2015-03-05 MED ORDER — ACETAMINOPHEN-CODEINE #3 300-30 MG PO TABS: 1.0000 | ORAL_TABLET | Freq: Every day | ORAL | Status: DC | PRN

## 2015-03-05 NOTE — Progress Notes (Signed)
STROKE NEUROLOGY FOLLOW UP NOTE  NAME: Renee Griffin DOB: 1956/05/09  REASON FOR VISIT: stroke follow up HISTORY FROM: patient and chart  Today we had the pleasure of seeing Renee BOHLS in follow-up at our Neurology Clinic. Pt was accompanied by no one.   History Summary 59 year Caucasian lady who with past medical history of HTN, depression, Anxiety/Panic attacks, dyslipidemia, 3 previous CVAs, s/p porcine aortic valve placement due to congenital defect, current smoker was admitted 06/02/2012 for paresthesias in hands and feet. MRI scan on 06/03/12 shows tiny subcentimeter left cerebellar and pontine acute infarcts which are likely silent. History of prior lacunar infarct in 2009. Vascular risk factors of smoking, Hyperlipidemia, hypertension, and prior stroke.   01/21/13 (PS): She returns for followup after last visit on 10/03/12. She continues to do well without any recurrent stroke or TIA symptoms. She continues to have mild rigidity with walking and dragging of the left leg but this appears to be unchanged. She states her blood pressure is usually quite well controlled but it is elevated in office today at 160/110. She had last lipid profile checked in December by her primary physician and it was fine. She remains on clopidogrel and is tolerating it well without side effects.   09/11/13 (LL): Patient returns to office for stroke followup. She has been doing well by report. She no longer has a PCP. She states she had tried to get an appointment with [facility] but was told they were not accepting new patients. She states her BP is usually well controlled when she takes it, it is elevated in office today, 154/100. She is taking Plavix daily but also states she is taking aspirin often because she is out of Tylenol 3. She states that she has bruises all the time and if she starts bleeding it takes a long time to stop it. She has no new neurological complaints.  07/09/14 follow up -  the patient has been doing well. No recurrent stroke like symptoms. She continued to smoke, 1PPD, and she has no desire to quit. When brought up the smoking cessation topic, she was very upset and did not want to mention about it. She stated that she still has a lot of stress and her brother is in ICU for bone marrow transplant now, and she needs her xanax for anxiety. She stated that she has been on that medication for 30 years and she did not want to change to longer acting benzo when I recommend to switch to clonazepam. She stated that she had weight gain and she can not do exercise due to LBP and gait difficulty. Her PCP asked her to eat less and she said she already ate less. She has her PCP appointment 7/24.  Interval History During the interval time, the patient has been doing well. No recurrent strokelike symptoms. She continues smoking, and she has no intent to quit. She continued follow-up with her PCP. She still walks with cane and no history of falls. She stated that she needed the Tylenol 3 refill 4 months before she sees her PCP in April.  REVIEW OF SYSTEMS: Full 14 system review of systems performed and notable only for those listed below and in HPI above, all others are negative:  Constitutional: N/A  Cardiovascular: N/A  Ear/Nose/Throat: N/A  Skin: N/A  Eyes: N/A  Respiratory: N/A  Gastroitestinal: N/A  Hematology/Lymphatic: bruise easily  Endocrine: N/A  Musculoskeletal: Back pain, walking difficulty  Allergy/Immunology: N/A  Neurological:  Headache Psychiatric: Nervous/anxious  The following represents the patient's updated allergies and side effects list: Allergies  Allergen Reactions  . Amoxicillin-Pot Clavulanate Diarrhea  . Bupropion Nausea Only  . Nicotine Nausea Only    Used patches only.    Labs since last visit of relevance include the following: Results for orders placed or performed in visit on 07/11/14  TSH + free T4  Result Value Ref Range   TSH 1.100  0.450 - 4.500 uIU/mL   Free T4 0.88 0.82 - 1.77 ng/dL  Lipid panel  Result Value Ref Range   Cholesterol, Total 217 (H) 100 - 199 mg/dL   Triglycerides 455 (H) 0 - 149 mg/dL   HDL 33 (L) >39 mg/dL   VLDL Cholesterol Cal Comment 5 - 40 mg/dL   LDL Calculated Comment 0 - 99 mg/dL   Chol/HDL Ratio 6.6 (H) 0.0 - 4.4 ratio units    The neurologically relevant items on the patient's problem list were reviewed on today's visit.  Neurologic Examination  A problem focused neurological exam (12 or more points of the single system neurologic examination, vital signs counts as 1 point, cranial nerves count for 8 points) was performed.  Blood pressure 140/91, pulse 71, height 5' 2.5" (1.588 m), weight 182 lb 12.8 oz (82.918 kg).  General - obese, well developed, in no apparent distress.  Ophthalmologic - Sharp disc margins OU.  Cardiovascular - Regular rate and rhythm with no murmur.  Mental Status -  Level of arousal and orientation to time, place, and person were intact. Language including expression, naming, repetition, comprehension, reading, and writing was assessed and found intact. Attention span and concentration were normal. Do not want to do calculation for me Recent and remote memory were 3/3 registration and 2/3 delayed recall. Fund of Knowledge was assessed and was intact.  Cranial Nerves II - XII - II - Vision intact OU. III, IV, VI - Extraocular movements intact. V - Facial sensation intact bilaterally. VII - Facial movement intact bilaterally. VIII - Hearing & vestibular intact bilaterally. X - Palate elevates symmetrically. XI - Chin turning & shoulder shrug intact bilaterally. XII - Tongue protrusion intact.  Motor Strength - The patient's strength was normal in all extremities and pronator drift was absent.  Bulk was normal and fasciculations were absent.   Motor Tone - Muscle tone was assessed at the neck and appendages and was normal.  Reflexes - The patient's  reflexes were normal in all extremities and she had no pathological reflexes.  Sensory - Light touch, temperature/pinprick, and Romberg testing were assessed and were normal.    Coordination - The patient had normal movements in the hands and feet with no ataxia or dysmetria.  Tremor was absent.  Gait and Station - slow cautious gait , walks with cane.  Data reviewed: I personally reviewed the images and agree with the radiology interpretations.  MRI 06/03/12 Subcentimeter areas of restricted diffusion left cerebellar cortex and  left paramedian lower pons consistent with acute infarction.  Atrophy, chronic microvascular ischemic change, numerous lacunes, and scattered microbleeds, all consistent with advanced hypertensive cerebrovascular disease. CTA neck 06/03/12 Previous thoracic surgery presumably for treatment of aortic aneurysm.  No discernible dissection or active process, though the aorta was not the primary target of this examination. Mild atherosclerotic disease at both carotid bifurcations without stenosis, when compared to a more distal cervical internal carotid arteries.  Plaque at the proximal ICA on the right could possibly serve as a source of emboli.  Both cervical internal carotid arteries are small vessels but without focal disease. No acute vertebral pathology evident. No preliminary report, it was noted that the left vertebral artery is somewhat thinned at the C4-5 level, where there is 5 mm of anterolisthesis, possibly stretching more distorting the vertebral artery.  I think the significance is questionable. CTA HEAD 06/03/12 No intracranial stenosis or occlusion.  Ectasia of the basilar artery. 2D echo 06/04/12 - Left ventricle: There was moderate concentric hypertrophy.   Systolic function was normal. The estimated ejection   fraction was in the range of 60% to 65%. Wall motion was   normal; there were no regional wall motion abnormalities.   Features are consistent with a  pseudonormal left   ventricular filling pattern, with concomitant abnormal   relaxation and increased filling pressure (grade 2   diastolic dysfunction). - Mitral valve: Moderately calcified annulus. Mildly   thickened leaflets . Impressions: - No cardiac source of embolism was identified, but cannot   be ruled out on the basis of this examination. Carotid Doppler 06/03/12 Bilateral: mild mixed plaque origin ICA. No significant ICA stenosis. Vertebral artery flow is antegrade. Tortuous vessels. Carotid Doppler 09/19/13 Negative study Carotid Doppler 07/30/2014 Negative study  Component     Latest Ref Rng 06/04/2012  Cholesterol     0 - 200 mg/dL 202 (H)  Triglycerides     <150 mg/dL 674 (H)  HDL     >39 mg/dL 27 (L)  Total CHOL/HDL Ratio      7.5  VLDL     0 - 40 mg/dL UNABLE TO CALCULATE IF TRIGLYCERIDE OVER 400 mg/dL  LDL (calc)     0 - 99 mg/dL UNABLE TO CALCULATE IF TRIGLYCERIDE OVER 400 mg/dL  Hemoglobin A1C     <5.7 % 6.1 (H)  Mean Plasma Glucose     <117 mg/dL 128 (H)    Assessment: As you may recall, she is a 59 y.o. Caucasian female with a diagnosis of stroke. Her punctate stroke in cerebellar and brainstem are likely small vessel events and she has significant risk factors including HTN, HLD, smoking. She has been following her PCP for risk factor control. Repeat carotid doppler 07/2014 negative for hemodynamically significant stenosis. She also refused to talk about smoking cessation.   Plan:  - continue plavix and Lipitor for stroke prevention - Continue Lopid for high TG level  - Check BP at home and record and bring over to PCP  - Follow up with your primary care physician for stroke risk factor modification. Recommend maintain blood pressure goal <130/80, diabetes with hemoglobin A1c goal below 6.5% and lipids with LDL cholesterol goal below 70 mg/dL.  - Refilled Tylenol #3 today for 1 months, no refills  - Encourage exercises and weight loss - RTC in 1 year     No orders of the defined types were placed in this encounter.   Meds ordered this encounter  Medications  . sertraline (ZOLOFT) 100 MG tablet    Sig: Take 100 mg by mouth daily.    Refill:  6  . Cholecalciferol (VITAMIN D-3) 1000 UNITS CAPS    Sig: Take 1,000 Units by mouth daily.  Marland Kitchen acetaminophen-codeine (TYLENOL #3) 300-30 MG per tablet    Sig: Take 1-2 tablets by mouth daily as needed for moderate pain.    Dispense:  45 tablet    Refill:  0   Patient Instructions  - continue plavix and lipitor for stroke prevention - check BP at home  and record - Follow up with your primary care physician for stroke risk factor modification. Recommend maintain blood pressure goal <130/80, diabetes with hemoglobin A1c goal below 6.5% and lipids with LDL cholesterol goal below 70 mg/dL.  - will refill tylenol #3 today, but you need to talk with Dr. Doylene Canard for further refill. - try to do more exercise and weight loss - follow up in one year.   Rosalin Hawking, MD PhD [facility] [address], [place], [place] [address] [phone]

## 2015-03-05 NOTE — Patient Instructions (Signed)
-   continue plavix and lipitor for stroke prevention - check BP at home and record - Follow up with your primary care physician for stroke risk factor modification. Recommend maintain blood pressure goal <130/80, diabetes with hemoglobin A1c goal below 6.5% and lipids with LDL cholesterol goal below 70 mg/dL.  - will refill tylenol #3 today, but you need to talk with Dr. Doylene Canard for further refill. - try to do more exercise and weight loss - follow up in one year.

## 2015-07-30 ENCOUNTER — Other Ambulatory Visit: Payer: Self-pay | Admitting: Neurology

## 2016-03-04 ENCOUNTER — Ambulatory Visit (INDEPENDENT_AMBULATORY_CARE_PROVIDER_SITE_OTHER): Payer: Medicare Other | Admitting: Neurology

## 2016-03-04 ENCOUNTER — Encounter: Payer: Self-pay | Admitting: Neurology

## 2016-03-04 VITALS — BP 142/89 | HR 61 | Ht 62.5 in | Wt 178.8 lb

## 2016-03-04 DIAGNOSIS — I6302 Cerebral infarction due to thrombosis of basilar artery: Secondary | ICD-10-CM

## 2016-03-04 DIAGNOSIS — F17200 Nicotine dependence, unspecified, uncomplicated: Secondary | ICD-10-CM | POA: Diagnosis not present

## 2016-03-04 DIAGNOSIS — E785 Hyperlipidemia, unspecified: Secondary | ICD-10-CM

## 2016-03-04 DIAGNOSIS — I10 Essential (primary) hypertension: Secondary | ICD-10-CM | POA: Insufficient documentation

## 2016-03-04 DIAGNOSIS — E669 Obesity, unspecified: Secondary | ICD-10-CM

## 2016-03-04 DIAGNOSIS — E1159 Type 2 diabetes mellitus with other circulatory complications: Secondary | ICD-10-CM

## 2016-03-04 NOTE — Progress Notes (Signed)
STROKE NEUROLOGY FOLLOW UP NOTE  NAME: Renee Griffin DOB: [birth year]  REASON FOR VISIT: stroke follow up HISTORY FROM: patient and chart  Today we had the pleasure of seeing Renee Griffin in follow-up at our Neurology Clinic. Pt was accompanied by no one.   History Summary 60 year Caucasian lady who with past medical history of HTN, depression, Anxiety/Panic attacks, dyslipidemia, 3 previous CVAs, s/p porcine aortic valve placement due to congenital defect, current smoker was admitted 06/02/2012 for paresthesias in hands and feet. MRI scan on 06/03/12 shows tiny subcentimeter left cerebellar and pontine acute infarcts which are likely silent. History of prior lacunar infarct in 2009. Vascular risk factors of smoking, Hyperlipidemia, hypertension, and prior stroke.   01/21/13 (PS): She returns for followup after last visit on 10/03/12. She continues to do well without any recurrent stroke or TIA symptoms. She continues to have mild rigidity with walking and dragging of the left leg but this appears to be unchanged. She states her blood pressure is usually quite well controlled but it is elevated in office today at 160/110. She had last lipid profile checked in December by her primary physician and it was fine. She remains on clopidogrel and is tolerating it well without side effects.   09/11/13 (LL): Patient returns to office for stroke followup. She has been doing well by report. She no longer has a PCP. She states she had tried to get an appointment with [facility] but was told they were not accepting new patients. She states her BP is usually well controlled when she takes it, it is elevated in office today, 154/100. She is taking Plavix daily but also states she is taking aspirin often because she is out of Tylenol 3. She states that she has bruises all the time and if she starts bleeding it takes a long time to stop it. She has no new neurological complaints.  07/09/14 follow up -  the patient has been doing well. No recurrent stroke like symptoms. She continued to smoke, 1PPD, and she has no desire to quit. When brought up the smoking cessation topic, she was very upset and did not want to mention about it. She stated that she still has a lot of stress and her brother is in ICU for bone marrow transplant now, and she needs her xanax for anxiety. She stated that she has been on that medication for 30 years and she did not want to change to longer acting benzo when I recommend to switch to clonazepam. She stated that she had weight gain and she can not do exercise due to LBP and gait difficulty. Her PCP asked her to eat less and she said she already ate less. She has her PCP appointment 7/24.  03/05/15 follow up - the patient has been doing well. No recurrent strokelike symptoms. She continues smoking, and she has no intent to quit. She continued follow-up with her PCP. She still walks with cane and no history of falls. She stated that she needed the Tylenol 3 refill 4 months before she sees her PCP in April.  Interval History During the interval time, she has been doing well. No recurrent stroke like symptoms. Continue smoking. She stated that she was diagnosed with DM during the interval time and started on metformin. Checking glucose at home, today 121. BP stable. Today 142/89. She complains of LBP and ask for oxycodone.   REVIEW OF SYSTEMS: Full 14 system review of systems performed and notable  only for those listed below and in HPI above, all others are negative:  Constitutional: N/A  Cardiovascular: N/A  Ear/Nose/Throat: N/A  Skin: N/A  Eyes: N/A  Respiratory: N/A  Gastroitestinal: N/A  Hematology/Lymphatic: bruise easily  Endocrine: N/A  Musculoskeletal: Back pain  Allergy/Immunology: N/A  Neurological: Headache Psychiatric: Nervous/anxious  The following represents the patient's updated allergies and side effects list: Allergies  Allergen Reactions  .  Amoxicillin-Pot Clavulanate Diarrhea  . Bupropion Nausea Only  . Nicotine Nausea Only    Used patches only.    Labs since last visit of relevance include the following: Results for orders placed or performed in visit on 07/11/14  TSH + free T4  Result Value Ref Range   TSH 1.100 0.450 - 4.500 uIU/mL   Free T4 0.88 0.82 - 1.77 ng/dL  Lipid panel  Result Value Ref Range   Cholesterol, Total 217 (H) 100 - 199 mg/dL   Triglycerides 455 (H) 0 - 149 mg/dL   HDL 33 (L) >39 mg/dL   VLDL Cholesterol Cal Comment 5 - 40 mg/dL   LDL Calculated Comment 0 - 99 mg/dL   Chol/HDL Ratio 6.6 (H) 0.0 - 4.4 ratio units    The neurologically relevant items on the patient's problem list were reviewed on today's visit.  Neurologic Examination  A problem focused neurological exam (12 or more points of the single system neurologic examination, vital signs counts as 1 point, cranial nerves count for 8 points) was performed.  Blood pressure 142/89, pulse 61, height 5' 2.5" (1.588 m), weight 178 lb 12.8 oz (81.103 kg).  General - obese, well developed, in no apparent distress.  Ophthalmologic - Sharp disc margins OU.  Cardiovascular - Regular rate and rhythm with no murmur.  Mental Status -  Level of arousal and orientation to time, place, and person were intact. Language including expression, naming, repetition, comprehension, reading, and writing was assessed and found intact. Fund of Knowledge was assessed and was intact.  Cranial Nerves II - XII - II - Vision intact OU. III, IV, VI - Extraocular movements intact. V - Facial sensation intact bilaterally. VII - Facial movement intact bilaterally. VIII - Hearing & vestibular intact bilaterally. X - Palate elevates symmetrically. XI - Chin turning & shoulder shrug intact bilaterally. XII - Tongue protrusion intact.  Motor Strength - The patient's strength was normal in all extremities and pronator drift was absent.  Bulk was normal and  fasciculations were absent.   Motor Tone - Muscle tone was assessed at the neck and appendages and was normal.  Reflexes - The patient's reflexes were normal in all extremities and she had no pathological reflexes.  Sensory - Light touch, temperature/pinprick, and Romberg testing were assessed and were normal.    Coordination - The patient had normal movements in the hands and feet with no ataxia or dysmetria.  Tremor was absent.  Gait and Station - slow cautious gait, walks with cane.  Data reviewed: I personally reviewed the images and agree with the radiology interpretations.  MRI 06/03/12 Subcentimeter areas of restricted diffusion left cerebellar cortex and  left paramedian lower pons consistent with acute infarction.  Atrophy, chronic microvascular ischemic change, numerous lacunes, and scattered microbleeds, all consistent with advanced hypertensive cerebrovascular disease. CTA neck 06/03/12 Previous thoracic surgery presumably for treatment of aortic aneurysm.  No discernible dissection or active process, though the aorta was not the primary target of this examination. Mild atherosclerotic disease at both carotid bifurcations without stenosis, when compared to a  more distal cervical internal carotid arteries.  Plaque at the proximal ICA on the right could possibly serve as a source of emboli.  Both cervical internal carotid arteries are small vessels but without focal disease. No acute vertebral pathology evident. No preliminary report, it was noted that the left vertebral artery is somewhat thinned at the C4-5 level, where there is 5 mm of anterolisthesis, possibly stretching more distorting the vertebral artery.  I think the significance is questionable. CTA HEAD 06/03/12 No intracranial stenosis or occlusion.  Ectasia of the basilar artery. 2D echo 06/04/12 - Left ventricle: There was moderate concentric hypertrophy.   Systolic function was normal. The estimated ejection   fraction was  in the range of 60% to 65%. Wall motion was   normal; there were no regional wall motion abnormalities.   Features are consistent with a pseudonormal left   ventricular filling pattern, with concomitant abnormal   relaxation and increased filling pressure (grade 2   diastolic dysfunction). - Mitral valve: Moderately calcified annulus. Mildly   thickened leaflets . Impressions: - No cardiac source of embolism was identified, but cannot   be ruled out on the basis of this examination. Carotid Doppler 06/03/12 Bilateral: mild mixed plaque origin ICA. No significant ICA stenosis. Vertebral artery flow is antegrade. Tortuous vessels. Carotid Doppler 09/19/13 Negative study Carotid Doppler 07/30/2014 Negative study  Component     Latest Ref Rng 06/04/2012  Cholesterol     0 - 200 mg/dL 202 (H)  Triglycerides     <150 mg/dL 674 (H)  HDL     >39 mg/dL 27 (L)  Total CHOL/HDL Ratio      7.5  VLDL     0 - 40 mg/dL UNABLE TO CALCULATE IF TRIGLYCERIDE OVER 400 mg/dL  LDL (calc)     0 - 99 mg/dL UNABLE TO CALCULATE IF TRIGLYCERIDE OVER 400 mg/dL  Hemoglobin A1C     <5.7 % 6.1 (H)  Mean Plasma Glucose     <117 mg/dL 128 (H)    Assessment: As you may recall, she is a 60 y.o. Caucasian female with a diagnosis of stroke. Her punctate stroke in cerebellar and brainstem are likely small vessel events and she has significant risk factors including HTN, HLD, smoking. She has been following her PCP for risk factor control. Repeat carotid doppler 07/2014 negative for hemodynamically significant stenosis. She also refused smoking cessation. During the interval time, found to have DM, on metformin. Will repeat CUS for monitoring.  Plan:  - continue plavix and Lipitor for stroke prevention - Continue Lopid for high TG level  - Check BP and glucose at home and record and bring over to PCP  - Follow up with your primary care physician for stroke risk factor modification. Recommend maintain blood pressure  goal <130/80, diabetes with hemoglobin A1c goal below 6.5% and lipids with LDL cholesterol goal below 70 mg/dL.  - will repeat carotid doppler   - Encourage exercises and diabetic diet - follow up as needed.   I spent more than 25 minutes of face to face time with the patient. Greater than 50% of time was spent in counseling and coordination of care.   Orders Placed This Encounter  Procedures  . US Carotid Bilateral    Standing Status: Future     Number of Occurrences:      Standing Expiration Date: 05/06/2017    Order Specific Question:  Reason for Exam (SYMPTOM  OR DIAGNOSIS REQUIRED)    Answer:  carotid  artery monitoring    Order Specific Question:  Preferred imaging location?    Answer:  Internal   Meds ordered this encounter  Medications  . metFORMIN (GLUCOPHAGE) 500 MG tablet    Sig: Take 500 mg by mouth 2 (two) times daily.    Refill:  6  . ranitidine (ZANTAC) 150 MG tablet    Sig: Take 75 mg by mouth 2 (two) times daily.    Refill:  3  . ACCU-CHEK SOFTCLIX LANCETS lancets    Sig: See admin instructions.    Refill:  3  . amLODipine (NORVASC) 5 MG tablet    Sig: Take 5 mg by mouth daily.    Refill:  6  . Blood Glucose Monitoring Suppl (ACCU-CHEK AVIVA PLUS) w/Device KIT    Sig: See admin instructions.    Refill:  0  . ACCU-CHEK AVIVA PLUS test strip    Sig: See admin instructions.    Refill:  0  . valACYclovir (VALTREX) 500 MG tablet    Sig: Take 500 mg by mouth daily.    Refill:  6  . ALPRAZolam (XANAX) 1 MG tablet    Sig: Take by mouth.  . DISCONTD: cloNIDine (CATAPRES) 0.3 MG tablet    Sig: Reported on 03/04/2016  . vitamin C (ASCORBIC ACID) 500 MG tablet    Sig: Take by mouth.  Marland Kitchen PROAIR HFA 108 (90 Base) MCG/ACT inhaler    Sig: USE 2 PUFFS 4 TIMES A DAY AS NEEDED    Refill:  3   Patient Instructions  - continue plavix and Lipitor for stroke prevention - Continue Lopid for high TG level  - Check BP and glucose at home and record and bring over to PCP  -  Follow up with your primary care physician for stroke risk factor modification. Recommend maintain blood pressure goal <130/80, diabetes with hemoglobin A1c goal below 6.5% and lipids with LDL cholesterol goal below 70 mg/dL.  - will repeat carotid doppler   - Encourage exercises and diabetic diet - follow up as needed.    Rosalin Hawking, MD PhD [facility] [address] [place], [place] [address] [phone]

## 2016-03-04 NOTE — Patient Instructions (Signed)
-   continue plavix and Lipitor for stroke prevention - Continue Lopid for high TG level  - Check BP and glucose at home and record and bring over to PCP  - Follow up with your primary care physician for stroke risk factor modification. Recommend maintain blood pressure goal <130/80, diabetes with hemoglobin A1c goal below 6.5% and lipids with LDL cholesterol goal below 70 mg/dL.  - will repeat carotid doppler   - Encourage exercises and diabetic diet - follow up as needed.

## 2016-03-10 ENCOUNTER — Ambulatory Visit (INDEPENDENT_AMBULATORY_CARE_PROVIDER_SITE_OTHER): Payer: Medicare Other

## 2016-03-10 DIAGNOSIS — I6302 Cerebral infarction due to thrombosis of basilar artery: Secondary | ICD-10-CM

## 2016-03-28 ENCOUNTER — Telehealth: Payer: Self-pay | Admitting: *Deleted

## 2016-03-28 NOTE — Telephone Encounter (Signed)
Attempted to reach pt on home phone; no answer, no answering machine.  Called mobile # and spoke with patient. Per Dr Erlinda Hong, carotid doppler was negative for any carotid artery narrowing. Advised she please continue current treatment. She verbalized understanding, appreciation.

## 2016-04-29 ENCOUNTER — Other Ambulatory Visit: Payer: Self-pay | Admitting: Neurology

## 2017-05-11 ENCOUNTER — Other Ambulatory Visit: Payer: Self-pay | Admitting: Cardiovascular Disease

## 2017-05-11 ENCOUNTER — Ambulatory Visit
Admission: RE | Admit: 2017-05-11 | Discharge: 2017-05-11 | Disposition: A | Discharge status: Home / Self Care | Payer: Medicare Other | Source: Ambulatory Visit | Attending: Cardiovascular Disease | Admitting: Cardiovascular Disease

## 2017-05-11 DIAGNOSIS — M5489 Other dorsalgia: Secondary | ICD-10-CM

## 2018-02-01 ENCOUNTER — Inpatient Hospital Stay (HOSPITAL_COMMUNITY)
Admission: EM | Admit: 2018-02-01 | Discharge: 2018-02-09 | DRG: 378 | Disposition: A | Discharge status: Home Health | Payer: Medicare Other | Attending: Cardiovascular Disease | Admitting: Cardiovascular Disease

## 2018-02-01 ENCOUNTER — Other Ambulatory Visit: Payer: Self-pay

## 2018-02-01 ENCOUNTER — Emergency Department (HOSPITAL_COMMUNITY): Payer: Medicare Other

## 2018-02-01 ENCOUNTER — Encounter (HOSPITAL_COMMUNITY): Payer: Self-pay

## 2018-02-01 DIAGNOSIS — Z803 Family history of malignant neoplasm of breast: Secondary | ICD-10-CM | POA: Diagnosis not present

## 2018-02-01 DIAGNOSIS — E7800 Pure hypercholesterolemia, unspecified: Secondary | ICD-10-CM | POA: Diagnosis present

## 2018-02-01 DIAGNOSIS — N209 Urinary calculus, unspecified: Secondary | ICD-10-CM | POA: Diagnosis present

## 2018-02-01 DIAGNOSIS — D3501 Benign neoplasm of right adrenal gland: Secondary | ICD-10-CM | POA: Diagnosis present

## 2018-02-01 DIAGNOSIS — K210 Gastro-esophageal reflux disease with esophagitis: Secondary | ICD-10-CM | POA: Diagnosis present

## 2018-02-01 DIAGNOSIS — K2981 Duodenitis with bleeding: Secondary | ICD-10-CM | POA: Diagnosis present

## 2018-02-01 DIAGNOSIS — D125 Benign neoplasm of sigmoid colon: Secondary | ICD-10-CM | POA: Diagnosis present

## 2018-02-01 DIAGNOSIS — I129 Hypertensive chronic kidney disease with stage 1 through stage 4 chronic kidney disease, or unspecified chronic kidney disease: Secondary | ICD-10-CM | POA: Diagnosis present

## 2018-02-01 DIAGNOSIS — K5900 Constipation, unspecified: Secondary | ICD-10-CM | POA: Diagnosis present

## 2018-02-01 DIAGNOSIS — K2901 Acute gastritis with bleeding: Principal | ICD-10-CM | POA: Diagnosis present

## 2018-02-01 DIAGNOSIS — I340 Nonrheumatic mitral (valve) insufficiency: Secondary | ICD-10-CM | POA: Diagnosis not present

## 2018-02-01 DIAGNOSIS — I481 Persistent atrial fibrillation: Secondary | ICD-10-CM | POA: Diagnosis present

## 2018-02-01 DIAGNOSIS — R109 Unspecified abdominal pain: Secondary | ICD-10-CM | POA: Diagnosis present

## 2018-02-01 DIAGNOSIS — E860 Dehydration: Secondary | ICD-10-CM | POA: Diagnosis present

## 2018-02-01 DIAGNOSIS — E1122 Type 2 diabetes mellitus with diabetic chronic kidney disease: Secondary | ICD-10-CM | POA: Diagnosis present

## 2018-02-01 DIAGNOSIS — Z951 Presence of aortocoronary bypass graft: Secondary | ICD-10-CM

## 2018-02-01 DIAGNOSIS — N179 Acute kidney failure, unspecified: Secondary | ICD-10-CM | POA: Diagnosis present

## 2018-02-01 DIAGNOSIS — N838 Other noninflammatory disorders of ovary, fallopian tube and broad ligament: Secondary | ICD-10-CM

## 2018-02-01 DIAGNOSIS — E278 Other specified disorders of adrenal gland: Secondary | ICD-10-CM

## 2018-02-01 DIAGNOSIS — Z801 Family history of malignant neoplasm of trachea, bronchus and lung: Secondary | ICD-10-CM | POA: Diagnosis not present

## 2018-02-01 DIAGNOSIS — R1900 Intra-abdominal and pelvic swelling, mass and lump, unspecified site: Secondary | ICD-10-CM

## 2018-02-01 DIAGNOSIS — D62 Acute posthemorrhagic anemia: Secondary | ICD-10-CM | POA: Diagnosis present

## 2018-02-01 DIAGNOSIS — N183 Chronic kidney disease, stage 3 (moderate): Secondary | ICD-10-CM | POA: Diagnosis present

## 2018-02-01 DIAGNOSIS — Z8041 Family history of malignant neoplasm of ovary: Secondary | ICD-10-CM | POA: Diagnosis not present

## 2018-02-01 DIAGNOSIS — F17210 Nicotine dependence, cigarettes, uncomplicated: Secondary | ICD-10-CM | POA: Diagnosis present

## 2018-02-01 DIAGNOSIS — F410 Panic disorder [episodic paroxysmal anxiety] without agoraphobia: Secondary | ICD-10-CM | POA: Diagnosis present

## 2018-02-01 DIAGNOSIS — E872 Acidosis: Secondary | ICD-10-CM | POA: Diagnosis present

## 2018-02-01 DIAGNOSIS — Z8673 Personal history of transient ischemic attack (TIA), and cerebral infarction without residual deficits: Secondary | ICD-10-CM | POA: Diagnosis not present

## 2018-02-01 DIAGNOSIS — K621 Rectal polyp: Secondary | ICD-10-CM | POA: Diagnosis present

## 2018-02-01 DIAGNOSIS — K625 Hemorrhage of anus and rectum: Secondary | ICD-10-CM | POA: Diagnosis present

## 2018-02-01 DIAGNOSIS — N839 Noninflammatory disorder of ovary, fallopian tube and broad ligament, unspecified: Secondary | ICD-10-CM | POA: Diagnosis present

## 2018-02-01 LAB — COMPREHENSIVE METABOLIC PANEL
ALT: 38 U/L (ref 14–54)
AST: 42 U/L — ABNORMAL HIGH (ref 15–41)
Albumin: 3.5 g/dL (ref 3.5–5.0)
Alkaline Phosphatase: 130 U/L — ABNORMAL HIGH (ref 38–126)
Anion gap: 16 — ABNORMAL HIGH (ref 5–15)
BUN: 88 mg/dL — ABNORMAL HIGH (ref 6–20)
CO2: 11 mmol/L — ABNORMAL LOW (ref 22–32)
Calcium: 10.9 mg/dL — ABNORMAL HIGH (ref 8.9–10.3)
Chloride: 113 mmol/L — ABNORMAL HIGH (ref 101–111)
Creatinine, Ser: 3.13 mg/dL — ABNORMAL HIGH (ref 0.44–1.00)
GFR calc Af Amer: 17 mL/min — ABNORMAL LOW (ref >60)
GFR calc non Af Amer: 15 mL/min — ABNORMAL LOW (ref >60)
Glucose, Bld: 124 mg/dL — ABNORMAL HIGH (ref 65–99)
Potassium: 5.1 mmol/L (ref 3.5–5.1)
Sodium: 140 mmol/L (ref 135–145)
Total Bilirubin: 0.5 mg/dL (ref 0.3–1.2)
Total Protein: 7.3 g/dL (ref 6.5–8.1)

## 2018-02-01 LAB — CBC WITH DIFFERENTIAL/PLATELET
Basophils Absolute: 0 10*3/uL (ref 0.0–0.1)
Basophils Relative: 0 %
Eosinophils Absolute: 0 10*3/uL (ref 0.0–0.7)
Eosinophils Relative: 0 %
HCT: 36 % (ref 36.0–46.0)
Hemoglobin: 11.8 g/dL — ABNORMAL LOW (ref 12.0–15.0)
Lymphocytes Relative: 7 %
Lymphs Abs: 1.1 10*3/uL (ref 0.7–4.0)
MCH: 29.1 pg (ref 26.0–34.0)
MCHC: 32.8 g/dL (ref 30.0–36.0)
MCV: 88.7 fL (ref 78.0–100.0)
Monocytes Absolute: 1 10*3/uL (ref 0.1–1.0)
Monocytes Relative: 6 %
Neutro Abs: 14.2 10*3/uL — ABNORMAL HIGH (ref 1.7–7.7)
Neutrophils Relative %: 87 %
Platelets: 666 10*3/uL — ABNORMAL HIGH (ref 150–400)
RBC: 4.06 MIL/uL (ref 3.87–5.11)
RDW: 18.9 % — ABNORMAL HIGH (ref 11.5–15.5)
WBC: 16.3 10*3/uL — ABNORMAL HIGH (ref 4.0–10.5)

## 2018-02-01 LAB — URINALYSIS, ROUTINE W REFLEX MICROSCOPIC
Bilirubin Urine: NEGATIVE
Glucose, UA: NEGATIVE mg/dL
Ketones, ur: 5 mg/dL — AB
Nitrite: NEGATIVE
Protein, ur: 100 mg/dL — AB
Specific Gravity, Urine: 1.015 (ref 1.005–1.030)
pH: 5 (ref 5.0–8.0)

## 2018-02-01 LAB — CK: Total CK: 735 U/L — ABNORMAL HIGH (ref 38–234)

## 2018-02-01 LAB — MAGNESIUM: Magnesium: 2 mg/dL (ref 1.7–2.4)

## 2018-02-01 LAB — LACTIC ACID, PLASMA: Lactic Acid, Venous: 0.9 mmol/L (ref 0.5–1.9)

## 2018-02-01 MED ORDER — PRAZOSIN HCL 1 MG PO CAPS
1.0000 mg | ORAL_CAPSULE | Freq: Two times a day (BID) | ORAL | Status: DC
  Administered 2018-02-02 – 2018-02-09 (×14): 1 mg via ORAL
  Filled 2018-02-01 (×17): qty 1

## 2018-02-01 MED ORDER — SODIUM CHLORIDE 0.9 % IV SOLN
INTRAVENOUS | Status: DC
  Administered 2018-02-01 – 2018-02-02 (×2): via INTRAVENOUS

## 2018-02-01 MED ORDER — ONDANSETRON HCL 4 MG/2ML IJ SOLN
4.0000 mg | Freq: Once | INTRAMUSCULAR | Status: AC
  Administered 2018-02-01: 4 mg via INTRAVENOUS
  Filled 2018-02-01: qty 2

## 2018-02-01 MED ORDER — ONDANSETRON HCL 4 MG/2ML IJ SOLN
4.0000 mg | Freq: Four times a day (QID) | INTRAMUSCULAR | Status: DC | PRN
  Administered 2018-02-02 – 2018-02-07 (×3): 4 mg via INTRAVENOUS
  Filled 2018-02-01 (×3): qty 2

## 2018-02-01 MED ORDER — SODIUM CHLORIDE 0.9 % IV BOLUS (SEPSIS)
1000.0000 mL | Freq: Once | INTRAVENOUS | Status: AC
  Administered 2018-02-01: 1000 mL via INTRAVENOUS

## 2018-02-01 MED ORDER — ACETAMINOPHEN 325 MG PO TABS
325.0000 mg | ORAL_TABLET | Freq: Four times a day (QID) | ORAL | Status: DC | PRN
  Administered 2018-02-02 – 2018-02-09 (×7): 325 mg via ORAL
  Filled 2018-02-01 (×7): qty 1

## 2018-02-01 MED ORDER — ACETAMINOPHEN-CODEINE #3 300-30 MG PO TABS
1.0000 | ORAL_TABLET | Freq: Every day | ORAL | Status: DC
  Administered 2018-02-01 – 2018-02-08 (×6): 1 via ORAL
  Filled 2018-02-01 (×7): qty 1

## 2018-02-01 MED ORDER — ALPRAZOLAM 0.25 MG PO TABS
0.2500 mg | ORAL_TABLET | Freq: Two times a day (BID) | ORAL | Status: DC
  Administered 2018-02-01 – 2018-02-06 (×10): 0.25 mg via ORAL
  Filled 2018-02-01 (×10): qty 1

## 2018-02-01 MED ORDER — ONDANSETRON HCL 4 MG PO TABS: 4.0000 mg | ORAL_TABLET | Freq: Four times a day (QID) | ORAL | Status: DC | PRN

## 2018-02-01 NOTE — ED Notes (Signed)
ED TO INPATIENT HANDOFF REPORT  Name/Age/Gender Renee Griffin 62 y.o. female  Code Status    Code Status Orders  (From admission, onward)        Start     Ordered   02/01/18 1700  Full code  Continuous     02/01/18 1704    Code Status History    Date Active Date Inactive Code Status Order ID Comments User Context   This patient has a current code status but no historical code status.      Home/SNF/Other Home  Chief Complaint Fall  Level of Care/Admitting Diagnosis ED Disposition    ED Disposition Condition Comment   Admit  Hospital Area: [facility] [[id number]]  Level of Care: Telemetry [5]  Admit to tele based on following criteria: Eval of Syncope  Diagnosis: Abdominal pain [456256]  Admitting Physician: [place], [place]  Attending Physician: Dixie Dials [[id number]]  Estimated length of stay: past midnight tomorrow  Certification:: I certify this patient will need inpatient services for at least 2 midnights  PT Class (Do Not Modify): Inpatient [101]  PT Acc Code (Do Not Modify): Private [1]       Medical History Past Medical History:  Diagnosis Date  . Depression   . Diabetes mellitus without complication ([place])   . Heart disease, congenital   . High cholesterol   . Hypertension   . Panic attack   . Stroke [facility])     Allergies Allergies  Allergen Reactions  . Amoxicillin-Pot Clavulanate Diarrhea    Has patient had a PCN reaction causing immediate rash, facial/tongue/throat swelling, SOB or lightheadedness with hypotension: Unknown Has patient had a PCN reaction causing severe rash involving mucus membranes or skin necrosis:UnknoWN Has patient had a PCN reaction that required hospitalization: Unknown Has patient had a PCN reaction occurring within the last 10 years: Unknown If all of the above answers are "NO", then may proceed with Cephalosporin use.   . Bupropion Nausea Only  . Nicotine Nausea Only    Used patches only.     IV Location/Drains/Wounds Patient Lines/Drains/Airways Status   Active Line/Drains/Airways    None          Labs/Imaging Results for orders placed or performed during the hospital encounter of 02/01/18 (from the past 48 hour(s))  CBC with Differential     Status: Abnormal   Collection Time: 02/01/18 12:49 PM  Result Value Ref Range   WBC 16.3 (H) 4.0 - 10.5 K/uL   RBC 4.06 3.87 - 5.11 MIL/uL   Hemoglobin 11.8 (L) 12.0 - 15.0 g/dL   HCT 36.0 36.0 - 46.0 %   MCV 88.7 78.0 - 100.0 fL   MCH 29.1 26.0 - 34.0 pg   MCHC 32.8 30.0 - 36.0 g/dL   RDW 18.9 (H) 11.5 - 15.5 %   Platelets 666 (H) 150 - 400 K/uL   Neutrophils Relative % 87 %   Neutro Abs 14.2 (H) 1.7 - 7.7 K/uL   Lymphocytes Relative 7 %   Lymphs Abs 1.1 0.7 - 4.0 K/uL   Monocytes Relative 6 %   Monocytes Absolute 1.0 0.1 - 1.0 K/uL   Eosinophils Relative 0 %   Eosinophils Absolute 0.0 0.0 - 0.7 K/uL   Basophils Relative 0 %   Basophils Absolute 0.0 0.0 - 0.1 K/uL    Comment: Performed at [facility], [place] [address], [place], [place] [address]  Comprehensive metabolic panel     Status: Abnormal   Collection  Time: 02/01/18 12:49 PM  Result Value Ref Range   Sodium 140 135 - 145 mmol/L   Potassium 5.1 3.5 - 5.1 mmol/L   Chloride 113 (H) 101 - 111 mmol/L   CO2 11 (L) 22 - 32 mmol/L   Glucose, Bld 124 (H) 65 - 99 mg/dL   BUN 88 (H) 6 - 20 mg/dL   Creatinine, Ser 3.13 (H) 0.44 - 1.00 mg/dL   Calcium 10.9 (H) 8.9 - 10.3 mg/dL   Total Protein 7.3 6.5 - 8.1 g/dL   Albumin 3.5 3.5 - 5.0 g/dL   AST 42 (H) 15 - 41 U/L   ALT 38 14 - 54 U/L   Alkaline Phosphatase 130 (H) 38 - 126 U/L   Total Bilirubin 0.5 0.3 - 1.2 mg/dL   GFR calc non Af Amer 15 (L) >60 mL/min   GFR calc Af Amer 17 (L) >60 mL/min    Comment: (NOTE) The eGFR has been calculated using the CKD EPI equation. This calculation has not been validated in all clinical situations. eGFR's persistently <60 mL/min signify possible Chronic  Kidney Disease.    Anion gap 16 (H) 5 - 15    Comment: Performed at [facility], [place] [address], [place], [place] [address]  CK     Status: Abnormal   Collection Time: 02/01/18 12:49 PM  Result Value Ref Range   Total CK 735 (H) 38 - 234 U/L    Comment: Performed at [facility] [place] [address], [place], [place] [address]  Magnesium     Status: None   Collection Time: 02/01/18 12:49 PM  Result Value Ref Range   Magnesium 2.0 1.7 - 2.4 mg/dL    Comment: Performed at [facility], [place] [address] [place], [place] [address]  Urinalysis, Routine w reflex microscopic     Status: Abnormal   Collection Time: 02/01/18  2:56 PM  Result Value Ref Range   Color, Urine YELLOW YELLOW   APPearance CLEAR CLEAR   Specific Gravity, Urine 1.015 1.005 - 1.030   pH 5.0 5.0 - 8.0   Glucose, UA NEGATIVE NEGATIVE mg/dL   Hgb urine dipstick MODERATE (A) NEGATIVE   Bilirubin Urine NEGATIVE NEGATIVE   Ketones, ur 5 (A) NEGATIVE mg/dL   Protein, ur 100 (A) NEGATIVE mg/dL   Nitrite NEGATIVE NEGATIVE   Leukocytes, UA TRACE (A) NEGATIVE   RBC / HPF 0-5 0 - 5 RBC/hpf   WBC, UA 0-5 0 - 5 WBC/hpf   Bacteria, UA RARE (A) NONE SEEN   Squamous Epithelial / LPF 0-5 (A) NONE SEEN   Mucus PRESENT    Hyaline Casts, UA PRESENT     Comment: Performed at [facility], [place] [address], [place], [place] [address]  Lactic acid, plasma     Status: None   Collection Time: 02/01/18  3:02 PM  Result Value Ref Range   Lactic Acid, Venous 0.9 0.5 - 1.9 mmol/L    Comment: Performed at [facility], [place] [address], [place], [place] [address]   Ct Abdomen Pelvis Wo Contrast  Result Date: 02/01/2018 CLINICAL DATA:  Unwitnessed fall. EXAM: CT ABDOMEN AND PELVIS WITHOUT CONTRAST TECHNIQUE: Multidetector CT imaging of the abdomen and pelvis was performed following the standard protocol without IV contrast. COMPARISON:  None. FINDINGS:  Lower chest: No acute abnormality. Hepatobiliary: No gallstones are noted. 11 mm low density is noted in right hepatic lobe which may represent cyst but is not definitive  on the basis of this study. Pancreas: Unremarkable. No pancreatic ductal dilatation or surrounding inflammatory changes. Spleen: Normal in size without focal abnormality. Adrenals/Urinary Tract: 3.3 cm right adrenal adenoma is noted. 10.3 cm solid left adrenal mass is noted concerning for neoplasm or metastatic disease. Multiple rounded exophytic rounded lesions are seen involving both kidneys which may represent hyperdense cysts, but neoplasm cannot be excluded. This includes 2.1 cm rounded mass arising from lower pole of right kidney. Nonobstructive bilateral nephrolithiasis is noted. No hydronephrosis or renal obstruction is noted. Urinary bladder is unremarkable. Stomach/Bowel: Stomach is within normal limits. Appendix appears normal. No evidence of bowel wall thickening, distention, or inflammatory changes. Vascular/Lymphatic: Aortic atherosclerosis. No enlarged abdominal or pelvic lymph nodes. Reproductive: Uterus and right ovary unremarkable. 7.5 cm complex septated cystic abnormality appears to be rising from the left ovary. Pelvic ultrasound is recommended for further evaluation. Other: No abdominal wall hernia or abnormality. No abdominopelvic ascites. Musculoskeletal: Multilevel degenerative disc disease is noted in the lumbar spine. IMPRESSION: 10.3 cm solid left adrenal mass is noted concerning for neoplasm or metastatic disease. Urologic consultation is recommended. 7.5 cm complex septated probable left ovarian cystic abnormality is noted. This is concerning for possible neoplasm. Pelvic ultrasound is recommended for further evaluation. Bilateral nonobstructive nephrolithiasis. 3.3 cm right adrenal adenoma. 11 mm low density seen in right hepatic lobe cyst statistically most likely representing cyst. Electronically Signed   By: Marijo Conception, M.D.   On: 02/01/2018 15:06   Dg Chest Portable 1 View  Result Date: 02/01/2018 CLINICAL DATA:  Weakness and shortness of Breath EXAM: PORTABLE CHEST 1 VIEW COMPARISON:  06/03/2012 FINDINGS: Cardiac shadow is accentuated by the portable technique. Elevation of the right hemidiaphragm is noted. The lungs are well aerated bilaterally without focal infiltrate or sizable effusion. No acute bony abnormality is seen. Postsurgical changes are again noted. IMPRESSION: No active disease. Electronically Signed   By: Inez Catalina M.D.   On: 02/01/2018 12:56    Pending Labs Unresulted Labs (From admission, onward)   Start     Ordered   02/02/18 0370  Basic metabolic panel  Tomorrow morning,   R     02/01/18 1704   02/02/18 0500  CBC  Tomorrow morning,   R     02/01/18 1704   02/01/18 1659  HIV antibody (Routine Testing)  Once,   R     02/01/18 1704      Vitals/Pain Today's Vitals   02/01/18 1600 02/01/18 1623 02/01/18 1630 02/01/18 1700  BP: (!) 164/98 (!) 164/98 (!) 181/93 (!) 165/91  Pulse:  (!) 110 (!) 114 (!) 114  Resp: '19 18 17 18  ' Temp:      TempSrc:      SpO2:  97% 98% 97%    Isolation Precautions No active isolations  Medications Medications  0.9 %  sodium chloride infusion (not administered)  ondansetron (ZOFRAN) tablet 4 mg (not administered)    Or  ondansetron (ZOFRAN) injection 4 mg (not administered)  sodium chloride 0.9 % bolus 1,000 mL (0 mLs Intravenous Stopped 02/01/18 1626)  ondansetron (ZOFRAN) injection 4 mg (4 mg Intravenous Given 02/01/18 1307)  sodium chloride 0.9 % bolus 1,000 mL (0 mLs Intravenous Stopped 02/01/18 1508)    Mobility walks with device-walker, non ambulatory during visit today in ED

## 2018-02-01 NOTE — ED Notes (Signed)
2x unsuccessful IV attempt

## 2018-02-01 NOTE — ED Provider Notes (Signed)
[facility] DEPT Provider Note   CSN: [id number] Arrival date & time: 02/01/18  1135     History   Chief Complaint Chief Complaint  Patient presents with  . Fall    HPI Renee Griffin is a 62 y.o. female.  HPI   62 year old female with generalized weakness.  EMS was called from a life alert service.  Patient was found laying on the floor covered in feces.  She states that she has been like this for at least 2 days.  She was too weak to get up.  She did not fall but rather slid out of bed as she was trying to get up.  She feels nauseated and has been having diarrhea.  No vomiting.  Denies any acute pain.    Past Medical History:  Diagnosis Date  . Depression   . Diabetes mellitus without complication ([place])   . Heart disease, congenital   . High cholesterol   . Hypertension   . Panic attack   . Stroke [facility])     Patient Active Problem List   Diagnosis Date Noted  . Essential hypertension 03/04/2016  . HLD (hyperlipidemia) 03/04/2016  . Type 2 diabetes mellitus with circulatory disorder ([place]) 03/04/2016  . Obesity 03/04/2016  . CVA (cerebral vascular accident) ([place]) 03/05/2015  . Bilateral low back pain without sciatica 03/05/2015  . Essential hypertension, benign 03/05/2015  . Tobacco use disorder 03/05/2015  . Hyperlipidemia 03/05/2015  . Hyperparathyroidism, primary ([place]) 02/13/2013  . Generalized ischemic cerebrovascular disease 02/06/2013  . Hyperreflexia 02/06/2013  . Abnormality of gait 02/06/2013  . Disturbance of skin sensation 02/06/2013  . CVA (cerebral infarction) 06/03/2012  . HTN (hypertension) 06/03/2012  . Dyslipidemia 06/03/2012  . Anxiety 06/03/2012    Past Surgical History:  Procedure Laterality Date  . CARDIAC SURGERY  2011   CABG  . WISDOM TOOTH EXTRACTION      OB History    No data available       Home Medications    Prior to Admission medications   Medication Sig Start Date End Date Taking?  Authorizing Provider  ACCU-CHEK AVIVA PLUS test strip See admin instructions. 01/29/16   [provider]  ACCU-CHEK SOFTCLIX LANCETS lancets See admin instructions. 01/30/16   [provider]  acetaminophen-codeine (TYLENOL #3) 300-30 MG per tablet Take 1-2 tablets by mouth daily as needed for moderate pain. 03/05/15   Rosalin Hawking, MD  ALPRAZolam Duanne Moron) 1 MG tablet Take by mouth. 11/26/12   [provider]  amLODipine (NORVASC) 5 MG tablet Take 5 mg by mouth daily. 01/30/16   [provider]  atorvastatin (LIPITOR) 40 MG tablet TAKE 1 TABLET (40 MG TOTAL) BY MOUTH DAILY. 07/30/15   Rosalin Hawking, MD  Blood Glucose Monitoring Suppl (ACCU-CHEK AVIVA PLUS) w/Device KIT See admin instructions. 01/29/16   [provider]  Cholecalciferol (VITAMIN D-3) 1000 UNITS CAPS Take 1,000 Units by mouth daily.    [provider]  cloNIDine (CATAPRES) 0.3 MG tablet Take 1 tablet (0.3 mg total) by mouth 3 (three) times daily. Patient taking differently: Take 0.3 mg by mouth 3 (three) times daily. TAKE 1/2 DAILY 06/04/12   Monika Salk, MD  clopidogrel (PLAVIX) 75 MG tablet Take 1 tablet (75 mg total) by mouth daily. 06/04/12   Monika Salk, MD  gemfibrozil (LOPID) 600 MG tablet Take 600 mg by mouth 2 (two) times daily before a meal.    [provider]  lisinopril-hydrochlorothiazide (PRINZIDE,ZESTORETIC) 20-25 MG  per tablet Take 1 tablet by mouth daily. 06/04/12   Monika Salk, MD  metFORMIN (GLUCOPHAGE) 500 MG tablet Take 500 mg by mouth 2 (two) times daily. 02/25/16   [provider]  methocarbamol (ROBAXIN) 500 MG tablet Take 1 tablet (500 mg total) by mouth 3 (three) times daily. 11/29/13   Harden Mo, MD  metoprolol tartrate (LOPRESSOR) 25 MG tablet Take 1 tablet (25 mg total) by mouth 2 (two) times daily. 06/04/12   Monika Salk, MD  PROAIR HFA 108 [phone] Base) MCG/ACT inhaler USE 2 PUFFS 4 TIMES A DAY AS NEEDED 12/10/15   [provider]    ranitidine (ZANTAC) 150 MG tablet Take 75 mg by mouth 2 (two) times daily. 02/07/16   [provider]  sertraline (ZOLOFT) 100 MG tablet Take 100 mg by mouth daily. 02/24/15   [provider]  valACYclovir (VALTREX) 500 MG tablet Take 500 mg by mouth daily. 02/27/16   [provider]  vitamin C (ASCORBIC ACID) 500 MG tablet Take by mouth.    [provider]    Family History Family History  Problem Relation Age of Onset  . Cancer Mother        uterian OR cervical  . Cancer Father        lung  . Leukemia Brother   . Cancer Sister        breast    Social History Social History   Tobacco Use  . Smoking status: Current Every Day Smoker    Packs/day: 1.00    Years: 50.00    Pack years: 50.00    Types: Cigarettes  . Smokeless tobacco: Never Used  Substance Use Topics  . Alcohol use: No    Alcohol/week: 0.0 oz  . Drug use: No     Allergies   Amoxicillin-pot clavulanate; Bupropion; and Nicotine   Review of Systems Review of Systems  All systems reviewed and negative, other than as noted in HPI.   Physical Exam Updated Vital Signs BP (!) 142/81 (BP Location: Left Arm)   Pulse (!) 106   Temp 97.9 F (36.6 C) (Rectal)   Resp (!) 22   SpO2 99%   Physical Exam  Constitutional: No distress.  Disheveled appearance.  Tremulous.  HENT:  Head: Normocephalic and atraumatic.  Eyes: Conjunctivae are normal. Right eye exhibits no discharge. Left eye exhibits no discharge.  Neck: Neck supple.  Cardiovascular: Regular rhythm and normal heart sounds. Exam reveals no gallop and no friction rub.  No murmur heard. Mild tachycardia  Pulmonary/Chest: Effort normal and breath sounds normal. No respiratory distress.  Abdominal: Soft. She exhibits no distension. There is no tenderness.  Musculoskeletal: She exhibits no edema or tenderness.  Some redness to b/l knees and elbows w/o significant tenderness or pain with ROM.   Neurological: She is  alert. No cranial nerve deficit. Coordination normal.  Oriented to self and place. Able to give month of February but not day of week. Global weakness. No focal motor deficit.   Skin: Skin is warm and dry.  Psychiatric: She has a normal mood and affect. Her behavior is normal. Thought content normal.  Nursing note and vitals reviewed.    ED Treatments / Results  Labs (all labs ordered are listed, but only abnormal results are displayed) Labs Reviewed  CBC WITH DIFFERENTIAL/PLATELET - Abnormal; Notable for the following components:      Result Value   WBC 16.3 (*)    Hemoglobin 11.8 (*)  RDW 18.9 (*)    Platelets 666 (*)    Neutro Abs 14.2 (*)    All other components within normal limits  COMPREHENSIVE METABOLIC PANEL - Abnormal; Notable for the following components:   Chloride 113 (*)    CO2 11 (*)    Glucose, Bld 124 (*)    BUN 88 (*)    Creatinine, Ser 3.13 (*)    Calcium 10.9 (*)    AST 42 (*)    Alkaline Phosphatase 130 (*)    GFR calc non Af Amer 15 (*)    GFR calc Af Amer 17 (*)    Anion gap 16 (*)    All other components within normal limits  CK - Abnormal; Notable for the following components:   Total CK 735 (*)    All other components within normal limits  URINALYSIS, ROUTINE W REFLEX MICROSCOPIC - Abnormal; Notable for the following components:   Hgb urine dipstick MODERATE (*)    Ketones, ur 5 (*)    Protein, ur 100 (*)    Leukocytes, UA TRACE (*)    Bacteria, UA RARE (*)    Squamous Epithelial / LPF 0-5 (*)    All other components within normal limits  MAGNESIUM  LACTIC ACID, PLASMA  LACTIC ACID, PLASMA    EKG  EKG Interpretation  Date/Time:  Thursday February 01 2018 12:12:38 EST Ventricular Rate:  108 PR Interval:    QRS Duration: 82 QT Interval:  328 QTC Calculation: 440 R Axis:   -10 Text Interpretation:  Sinus tachycardia Multiple ventricular premature complexes Consider anterior infarct Borderline repolarization abnormality Confirmed by  Virgel Manifold [phone]) on 02/01/2018 1:16:28 PM       Radiology Ct Abdomen Pelvis Wo Contrast  Result Date: 02/01/2018 CLINICAL DATA:  Unwitnessed fall. EXAM: CT ABDOMEN AND PELVIS WITHOUT CONTRAST TECHNIQUE: Multidetector CT imaging of the abdomen and pelvis was performed following the standard protocol without IV contrast. COMPARISON:  None. FINDINGS: Lower chest: No acute abnormality. Hepatobiliary: No gallstones are noted. 11 mm low density is noted in right hepatic lobe which may represent cyst but is not definitive on the basis of this study. Pancreas: Unremarkable. No pancreatic ductal dilatation or surrounding inflammatory changes. Spleen: Normal in size without focal abnormality. Adrenals/Urinary Tract: 3.3 cm right adrenal adenoma is noted. 10.3 cm solid left adrenal mass is noted concerning for neoplasm or metastatic disease. Multiple rounded exophytic rounded lesions are seen involving both kidneys which may represent hyperdense cysts, but neoplasm cannot be excluded. This includes 2.1 cm rounded mass arising from lower pole of right kidney. Nonobstructive bilateral nephrolithiasis is noted. No hydronephrosis or renal obstruction is noted. Urinary bladder is unremarkable. Stomach/Bowel: Stomach is within normal limits. Appendix appears normal. No evidence of bowel wall thickening, distention, or inflammatory changes. Vascular/Lymphatic: Aortic atherosclerosis. No enlarged abdominal or pelvic lymph nodes. Reproductive: Uterus and right ovary unremarkable. 7.5 cm complex septated cystic abnormality appears to be rising from the left ovary. Pelvic ultrasound is recommended for further evaluation. Other: No abdominal wall hernia or abnormality. No abdominopelvic ascites. Musculoskeletal: Multilevel degenerative disc disease is noted in the lumbar spine. IMPRESSION: 10.3 cm solid left adrenal mass is noted concerning for neoplasm or metastatic disease. Urologic consultation is recommended. 7.5 cm complex  septated probable left ovarian cystic abnormality is noted. This is concerning for possible neoplasm. Pelvic ultrasound is recommended for further evaluation. Bilateral nonobstructive nephrolithiasis. 3.3 cm right adrenal adenoma. 11 mm low density seen in right hepatic lobe cyst statistically most  likely representing cyst. Electronically Signed   By: Marijo Conception, M.D.   On: 02/01/2018 15:06   Dg Chest Portable 1 View  Result Date: 02/01/2018 CLINICAL DATA:  Weakness and shortness of Breath EXAM: PORTABLE CHEST 1 VIEW COMPARISON:  06/03/2012 FINDINGS: Cardiac shadow is accentuated by the portable technique. Elevation of the right hemidiaphragm is noted. The lungs are well aerated bilaterally without focal infiltrate or sizable effusion. No acute bony abnormality is seen. Postsurgical changes are again noted. IMPRESSION: No active disease. Electronically Signed   By: Inez Catalina M.D.   On: 02/01/2018 12:56    Procedures Procedures (including critical care time)  Medications Ordered in ED Medications  sodium chloride 0.9 % bolus 1,000 mL (1,000 mLs Intravenous New Bag/Given 02/01/18 1307)  ondansetron (ZOFRAN) injection 4 mg (4 mg Intravenous Given 02/01/18 1307)     Initial Impression / Assessment and Plan / ED Course  I have reviewed the triage vital signs and the nursing notes.  Pertinent labs & imaging results that were available during my care of the patient were reviewed by me and considered in my medical decision making (see chart for details).     62 year old female with generalized weakness to the point that she was unable to get up from the floor.  AK I.  At least 3 with a BUN of 88.  No recent labs for comparison but creatinine several years ago was normal.  She is treated with IV fluids.  She denied any acute pain, but she had some diffuse tenderness on abdominal exam.  Unfortunately, CT shows large ovarian mass as well as a large adrenal mass. She will be admitted for ongoing  hydration and further eval of these findings. Discussed with Dr Doylene Canard.   Final Clinical Impressions(s) / ED Diagnoses   Final diagnoses:  Dehydration  AKI (acute kidney injury) ([place])  Ovarian mass  Adrenal mass [facility])    ED Discharge Orders    None       Virgel Manifold, MD 02/02/18 1227

## 2018-02-01 NOTE — ED Notes (Signed)
US at bedside

## 2018-02-01 NOTE — ED Triage Notes (Addendum)
Pt BIB Ems from home- EMS was called out from life alert service. Pt was found on the floor covered in feces, matress off the bed. Pt was trying to get in bed but slid down off bed onto floor, stating she couldn't get into her bed and that she's been lying on the floor possibly a couple of days. Pt walks with walker at home. A/Ox4.

## 2018-02-01 NOTE — Progress Notes (Addendum)
Patient arrived to unit, skin is red right shoulder, rt hip, rt foot, some redness left foot . Scattered bruises. Pt is a&ox3.

## 2018-02-01 NOTE — Progress Notes (Signed)
Pt refused to answer questions on nursing admission hx. Pt's rn informed. Lucius Conn BSN, RN-BC Admissions RN 02/01/2018 6:54 PM

## 2018-02-01 NOTE — ED Notes (Signed)
Bed: WA08 Expected date:  Expected time:  Means of arrival:  Comments: EMS- 61yo F, fall/spinal immobilization

## 2018-02-01 NOTE — Consult Note (Signed)
Urology Consult  Referring physician: Dr Doylene Canard Reason for referral: Adrenal Mass  Chief Complaint: abdominal pain  History of Present Illness: Renee Griffin is a 62yo with a hx of HTN, CVA, DMII admitted with generalized fatigue and dehydration. She was on the floor for several days priro to being admitted. Her BP was 210/110 with a HR of over 100. Per the patient she is on multiple antihypertensives with poor control of her hypertension. She denies any weight gain or loss. No fevers/chills/sweats. She does have headaches and generalized body aches. She underwent CT scan which showed an ovarian mass and a 10cm left adrenal mass with calcifications. He abdominal pain is dull moderate constant and nonradiating. No exacerbating/alleviating events. No other associated symtpoms  Past Medical History:  Diagnosis Date  . Depression   . Diabetes mellitus without complication ([place])   . Heart disease, congenital   . High cholesterol   . Hypertension   . Panic attack   . Stroke [facility])    Past Surgical History:  Procedure Laterality Date  . CARDIAC SURGERY  2011   CABG  . WISDOM TOOTH EXTRACTION      Medications: I have reviewed the patient's current medications. Allergies:  Allergies  Allergen Reactions  . Amoxicillin-Pot Clavulanate Diarrhea    Has patient had a PCN reaction causing immediate rash, facial/tongue/throat swelling, SOB or lightheadedness with hypotension: Unknown Has patient had a PCN reaction causing severe rash involving mucus membranes or skin necrosis:UnknoWN Has patient had a PCN reaction that required hospitalization: Unknown Has patient had a PCN reaction occurring within the last 10 years: Unknown If all of the above answers are "NO", then may proceed with Cephalosporin use.   . Bupropion Nausea Only  . Nicotine Nausea Only    Used patches only.    Family History  Problem Relation Age of Onset  . Cancer Mother        uterian OR cervical  . Cancer Father    lung  . Leukemia Brother   . Cancer Sister        breast   Social History:  reports that she has been smoking cigarettes.  She has a 50.00 pack-year smoking history. she has never used smokeless tobacco. She reports that she does not drink alcohol or use drugs.  Review of Systems  Constitutional: Positive for malaise/fatigue and weight loss.  Gastrointestinal: Positive for abdominal pain and diarrhea.  Neurological: Positive for weakness.  All other systems reviewed and are negative.   Physical Exam:  Vital signs in last 24 hours: Temp:  [97.6 F (36.4 C)-98.6 F (37 C)] 98.6 F (37 C) (02/07 2104) Pulse Rate:  [106-114] 111 (02/07 2104) Resp:  [16-24] 16 (02/07 2104) BP: (141-209)/(66-183) 141/88 (02/07 2104) SpO2:  [97 %-100 %] 98 % (02/07 2104) Weight:  [74.9 kg (165 lb 2 oz)] 74.9 kg (165 lb 2 oz) (02/07 2104) Physical Exam  Constitutional: She is oriented to person, place, and time. She appears well-developed and well-nourished.  HENT:  Head: Normocephalic and atraumatic.  Eyes: EOM are normal. Pupils are equal, round, and reactive to light.  Neck: Normal range of motion. No thyromegaly present.  Cardiovascular: Normal rate and regular rhythm.  Respiratory: Effort normal. No respiratory distress.  GI: Soft. She exhibits no distension.  Musculoskeletal: Normal range of motion. She exhibits no edema.  Neurological: She is alert and oriented to person, place, and time.  Skin: Skin is warm and dry.  Psychiatric: She has a normal mood and affect.  Her behavior is normal. Judgment and thought content normal.    Laboratory Data:  Results for orders placed or performed during the hospital encounter of 02/01/18 (from the past 72 hour(s))  CBC with Differential     Status: Abnormal   Collection Time: 02/01/18 12:49 PM  Result Value Ref Range   WBC 16.3 (H) 4.0 - 10.5 K/uL   RBC 4.06 3.87 - 5.11 MIL/uL   Hemoglobin 11.8 (L) 12.0 - 15.0 g/dL   HCT 36.0 36.0 - 46.0 %   MCV  88.7 78.0 - 100.0 fL   MCH 29.1 26.0 - 34.0 pg   MCHC 32.8 30.0 - 36.0 g/dL   RDW 18.9 (H) 11.5 - 15.5 %   Platelets 666 (H) 150 - 400 K/uL   Neutrophils Relative % 87 %   Neutro Abs 14.2 (H) 1.7 - 7.7 K/uL   Lymphocytes Relative 7 %   Lymphs Abs 1.1 0.7 - 4.0 K/uL   Monocytes Relative 6 %   Monocytes Absolute 1.0 0.1 - 1.0 K/uL   Eosinophils Relative 0 %   Eosinophils Absolute 0.0 0.0 - 0.7 K/uL   Basophils Relative 0 %   Basophils Absolute 0.0 0.0 - 0.1 K/uL    Comment: Performed at [facility], [place] [address] [place], [place] [address]  Comprehensive metabolic panel     Status: Abnormal   Collection Time: 02/01/18 12:49 PM  Result Value Ref Range   Sodium 140 135 - 145 mmol/L   Potassium 5.1 3.5 - 5.1 mmol/L   Chloride 113 (H) 101 - 111 mmol/L   CO2 11 (L) 22 - 32 mmol/L   Glucose, Bld 124 (H) 65 - 99 mg/dL   BUN 88 (H) 6 - 20 mg/dL   Creatinine, Ser 3.13 (H) 0.44 - 1.00 mg/dL   Calcium 10.9 (H) 8.9 - 10.3 mg/dL   Total Protein 7.3 6.5 - 8.1 g/dL   Albumin 3.5 3.5 - 5.0 g/dL   AST 42 (H) 15 - 41 U/L   ALT 38 14 - 54 U/L   Alkaline Phosphatase 130 (H) 38 - 126 U/L   Total Bilirubin 0.5 0.3 - 1.2 mg/dL   GFR calc non Af Amer 15 (L) >60 mL/min   GFR calc Af Amer 17 (L) >60 mL/min    Comment: (NOTE) The eGFR has been calculated using the CKD EPI equation. This calculation has not been validated in all clinical situations. eGFR's persistently <60 mL/min signify possible Chronic Kidney Disease.    Anion gap 16 (H) 5 - 15    Comment: Performed at [facility], [place] [address], [place], [place] [address]  CK     Status: Abnormal   Collection Time: 02/01/18 12:49 PM  Result Value Ref Range   Total CK 735 (H) 38 - 234 U/L    Comment: Performed at [facility], [place] [address], [place], [place] [address]  Magnesium     Status: None   Collection Time: 02/01/18 12:49 PM  Result Value Ref Range   Magnesium 2.0 1.7 - 2.4  mg/dL    Comment: Performed at [facility], [place] [address], [place], [place] [address]  Urinalysis, Routine w reflex microscopic     Status: Abnormal   Collection Time: 02/01/18  2:56 PM  Result Value Ref Range   Color, Urine YELLOW YELLOW   APPearance CLEAR CLEAR   Specific Gravity, Urine 1.015 1.005 - 1.030   pH 5.0 5.0 - 8.0  Glucose, UA NEGATIVE NEGATIVE mg/dL   Hgb urine dipstick MODERATE (A) NEGATIVE   Bilirubin Urine NEGATIVE NEGATIVE   Ketones, ur 5 (A) NEGATIVE mg/dL   Protein, ur 100 (A) NEGATIVE mg/dL   Nitrite NEGATIVE NEGATIVE   Leukocytes, UA TRACE (A) NEGATIVE   RBC / HPF 0-5 0 - 5 RBC/hpf   WBC, UA 0-5 0 - 5 WBC/hpf   Bacteria, UA RARE (A) NONE SEEN   Squamous Epithelial / LPF 0-5 (A) NONE SEEN   Mucus PRESENT    Hyaline Casts, UA PRESENT     Comment: Performed at [facility], [place] [address], [place], [place] [address]  Lactic acid, plasma     Status: None   Collection Time: 02/01/18  3:02 PM  Result Value Ref Range   Lactic Acid, Venous 0.9 0.5 - 1.9 mmol/L    Comment: Performed at [facility], [place] [address] [place], [place] [address]   No results found for this or any previous visit (from the past 240 hour(s)). Creatinine: Recent Labs    02/01/18 1249  CREATININE 3.13*   Baseline Creatinine: unknown  Impression/Assessment:  61yo with a large left adrenal tumor concerning for malignancy versus metabolic tumor  Plan:  1. I discussed the natural history and causes of adrenal tumors as well as the workup and management. I have ordered plasma metanephrines and AM cortisol. Do not biopsy this mass as it could make her hypertension worse and will not change the management of her adrenal mass. I discussed the treatment including radical left adrenalectomy with the patient. She will need to be optimized medically including BP control prior to adrenalectomy. She would also benefit from adding an alpha  blocker to her BP regiment.  Nicolette Bang 02/01/2018, 9:28 PM

## 2018-02-01 NOTE — H&P (Signed)
Referring Physician:  [name] is an 62 y.o. female.                       Chief Complaint: Generalized weakness  HPI: 62 year old female has loss of appetite x 2 weeks and has nausea with poor oral intake x 1 week and diarrhea x 3 days. She has generalized weakness and has been on floor x 2 days. She denies chest pain, fever or cough. She also denies vomiting. In ER her blood work shows elevated BUN/Cr and CT of abdomen and pelvis shows large left adrenal mass and large left ovarian mass.   Past Medical History:  Diagnosis Date  . Depression   . Diabetes mellitus without complication ([place])   . Heart disease, congenital   . High cholesterol   . Hypertension   . Panic attack   . Stroke [facility])       Past Surgical History:  Procedure Laterality Date  . CARDIAC SURGERY  2011   CABG  . WISDOM TOOTH EXTRACTION      Family History  Problem Relation Age of Onset  . Cancer Mother        uterian OR cervical  . Cancer Father        lung  . Leukemia Brother   . Cancer Sister        breast   Social History:  reports that she has been smoking cigarettes.  She has a 50.00 pack-year smoking history. she has never used smokeless tobacco. She reports that she does not drink alcohol or use drugs.  Allergies:  Allergies  Allergen Reactions  . Amoxicillin-Pot Clavulanate Diarrhea    Has patient had a PCN reaction causing immediate rash, facial/tongue/throat swelling, SOB or lightheadedness with hypotension: Unknown Has patient had a PCN reaction causing severe rash involving mucus membranes or skin necrosis:UnknoWN Has patient had a PCN reaction that required hospitalization: Unknown Has patient had a PCN reaction occurring within the last 10 years: Unknown If all of the above answers are "NO", then may proceed with Cephalosporin use.   . Bupropion Nausea Only  . Nicotine Nausea Only    Used patches only.     (Not in a hospital admission)  Results for orders placed or  performed during the hospital encounter of 02/01/18 (from the past 48 hour(s))  CBC with Differential     Status: Abnormal   Collection Time: 02/01/18 12:49 PM  Result Value Ref Range   WBC 16.3 (H) 4.0 - 10.5 K/uL   RBC 4.06 3.87 - 5.11 MIL/uL   Hemoglobin 11.8 (L) 12.0 - 15.0 g/dL   HCT 36.0 36.0 - 46.0 %   MCV 88.7 78.0 - 100.0 fL   MCH 29.1 26.0 - 34.0 pg   MCHC 32.8 30.0 - 36.0 g/dL   RDW 18.9 (H) 11.5 - 15.5 %   Platelets 666 (H) 150 - 400 K/uL   Neutrophils Relative % 87 %   Neutro Abs 14.2 (H) 1.7 - 7.7 K/uL   Lymphocytes Relative 7 %   Lymphs Abs 1.1 0.7 - 4.0 K/uL   Monocytes Relative 6 %   Monocytes Absolute 1.0 0.1 - 1.0 K/uL   Eosinophils Relative 0 %   Eosinophils Absolute 0.0 0.0 - 0.7 K/uL   Basophils Relative 0 %   Basophils Absolute 0.0 0.0 - 0.1 K/uL    Comment: Performed at [facility], [place] [address] [place], [place] [address]  Comprehensive metabolic  panel     Status: Abnormal   Collection Time: 02/01/18 12:49 PM  Result Value Ref Range   Sodium 140 135 - 145 mmol/L   Potassium 5.1 3.5 - 5.1 mmol/L   Chloride 113 (H) 101 - 111 mmol/L   CO2 11 (L) 22 - 32 mmol/L   Glucose, Bld 124 (H) 65 - 99 mg/dL   BUN 88 (H) 6 - 20 mg/dL   Creatinine, Ser 3.13 (H) 0.44 - 1.00 mg/dL   Calcium 10.9 (H) 8.9 - 10.3 mg/dL   Total Protein 7.3 6.5 - 8.1 g/dL   Albumin 3.5 3.5 - 5.0 g/dL   AST 42 (H) 15 - 41 U/L   ALT 38 14 - 54 U/L   Alkaline Phosphatase 130 (H) 38 - 126 U/L   Total Bilirubin 0.5 0.3 - 1.2 mg/dL   GFR calc non Af Amer 15 (L) >60 mL/min   GFR calc Af Amer 17 (L) >60 mL/min    Comment: (NOTE) The eGFR has been calculated using the CKD EPI equation. This calculation has not been validated in all clinical situations. eGFR's persistently <60 mL/min signify possible Chronic Kidney Disease.    Anion gap 16 (H) 5 - 15    Comment: Performed at [facility], [place] [address] [place], [place] [address]  CK     Status:  Abnormal   Collection Time: 02/01/18 12:49 PM  Result Value Ref Range   Total CK 735 (H) 38 - 234 U/L    Comment: Performed at [facility], [place] [address], [place], [place] [address]  Magnesium     Status: None   Collection Time: 02/01/18 12:49 PM  Result Value Ref Range   Magnesium 2.0 1.7 - 2.4 mg/dL    Comment: Performed at [facility], [place] [address] [place], [place] [address]  Urinalysis, Routine w reflex microscopic     Status: Abnormal   Collection Time: 02/01/18  2:56 PM  Result Value Ref Range   Color, Urine YELLOW YELLOW   APPearance CLEAR CLEAR   Specific Gravity, Urine 1.015 1.005 - 1.030   pH 5.0 5.0 - 8.0   Glucose, UA NEGATIVE NEGATIVE mg/dL   Hgb urine dipstick MODERATE (A) NEGATIVE   Bilirubin Urine NEGATIVE NEGATIVE   Ketones, ur 5 (A) NEGATIVE mg/dL   Protein, ur 100 (A) NEGATIVE mg/dL   Nitrite NEGATIVE NEGATIVE   Leukocytes, UA TRACE (A) NEGATIVE   RBC / HPF 0-5 0 - 5 RBC/hpf   WBC, UA 0-5 0 - 5 WBC/hpf   Bacteria, UA RARE (A) NONE SEEN   Squamous Epithelial / LPF 0-5 (A) NONE SEEN   Mucus PRESENT    Hyaline Casts, UA PRESENT     Comment: Performed at [facility], [place] [address], [place], [place] [address]  Lactic acid, plasma     Status: None   Collection Time: 02/01/18  3:02 PM  Result Value Ref Range   Lactic Acid, Venous 0.9 0.5 - 1.9 mmol/L    Comment: Performed at [facility], [place] [address] [place], [place] [address]   Ct Abdomen Pelvis Wo Contrast  Result Date: 02/01/2018 CLINICAL DATA:  Unwitnessed fall. EXAM: CT ABDOMEN AND PELVIS WITHOUT CONTRAST TECHNIQUE: Multidetector CT imaging of the abdomen and pelvis was performed following the standard protocol without IV contrast. COMPARISON:  None. FINDINGS: Lower chest: No acute abnormality. Hepatobiliary: No gallstones are noted. 11 mm low density is noted in right hepatic  lobe which may represent cyst but is not  definitive on the basis of this study. Pancreas: Unremarkable. No pancreatic ductal dilatation or surrounding inflammatory changes. Spleen: Normal in size without focal abnormality. Adrenals/Urinary Tract: 3.3 cm right adrenal adenoma is noted. 10.3 cm solid left adrenal mass is noted concerning for neoplasm or metastatic disease. Multiple rounded exophytic rounded lesions are seen involving both kidneys which may represent hyperdense cysts, but neoplasm cannot be excluded. This includes 2.1 cm rounded mass arising from lower pole of right kidney. Nonobstructive bilateral nephrolithiasis is noted. No hydronephrosis or renal obstruction is noted. Urinary bladder is unremarkable. Stomach/Bowel: Stomach is within normal limits. Appendix appears normal. No evidence of bowel wall thickening, distention, or inflammatory changes. Vascular/Lymphatic: Aortic atherosclerosis. No enlarged abdominal or pelvic lymph nodes. Reproductive: Uterus and right ovary unremarkable. 7.5 cm complex septated cystic abnormality appears to be rising from the left ovary. Pelvic ultrasound is recommended for further evaluation. Other: No abdominal wall hernia or abnormality. No abdominopelvic ascites. Musculoskeletal: Multilevel degenerative disc disease is noted in the lumbar spine. IMPRESSION: 10.3 cm solid left adrenal mass is noted concerning for neoplasm or metastatic disease. Urologic consultation is recommended. 7.5 cm complex septated probable left ovarian cystic abnormality is noted. This is concerning for possible neoplasm. Pelvic ultrasound is recommended for further evaluation. Bilateral nonobstructive nephrolithiasis. 3.3 cm right adrenal adenoma. 11 mm low density seen in right hepatic lobe cyst statistically most likely representing cyst. Electronically Signed   By: Marijo Conception, M.D.   On: 02/01/2018 15:06   Dg Chest Portable 1 View  Result Date: 02/01/2018 CLINICAL DATA:  Weakness and shortness of Breath EXAM: PORTABLE  CHEST 1 VIEW COMPARISON:  06/03/2012 FINDINGS: Cardiac shadow is accentuated by the portable technique. Elevation of the right hemidiaphragm is noted. The lungs are well aerated bilaterally without focal infiltrate or sizable effusion. No acute bony abnormality is seen. Postsurgical changes are again noted. IMPRESSION: No active disease. Electronically Signed   By: Inez Catalina M.D.   On: 02/01/2018 12:56    Review Of Systems Constitutional: No fever, chills, weight loss or gain. Eyes: No vision change, wears glasses. No discharge or pain. Ears: No hearing loss, No tinnitus. Respiratory: No asthma, COPD, pneumonias. No shortness of breath. No hemoptysis. Cardiovascular: No chest pain, palpitation, leg edema. Gastrointestinal: Positive nausea, vomiting, positive diarrhea, constipation. No GI bleed. No hepatitis. Genitourinary: No dysuria, hematuria, kidney stone. No incontinance. Neurological: No headache, stroke, seizures.  Psychiatry: No psych facility admission for anxiety, depression, suicide. No detox. Skin: No rash. Musculoskeletal: Positive joint pain, fibromyalgia, neck pain, back pain. Lymphadenopathy: No lymphadenopathy. Hematology: No anemia or easy bruising.   Blood pressure (!) 164/98, pulse (!) 110, temperature 97.9 F (36.6 C), temperature source Rectal, resp. rate 18, SpO2 97 %. There is no height or weight on file to calculate BMI. General appearance: alert, cooperative, appears stated age and no distress Head: Normocephalic, atraumatic. Tongue -pink and dry Eyes: Brown eyes, pink conjunctiva, corneas clear. PERRL, EOM's intact. Neck: No adenopathy, no carotid bruit, no JVD, supple, symmetrical, trachea midline and thyroid not enlarged. Resp: Clear to auscultation bilaterally. Cardio: Regular rate and rhythm, S1, S2 normal, II/VI systolic murmur, no click, rub or gallop GI: Soft, epigastric and umbilical area tenderness; bowel sounds normal. Extremities: No edema, cyanosis  or clubbing. Skin: Warm and dry.  Neurologic: Alert and oriented X 3, normal strength.  Assessment/Plan Dehydration Acute renal failure from above Abdominal pain Hematuria Non-obstructing urinary stones Large left adrenal mass  Large left ovarian mass Hypertension Tobacco use disorder  Admit. Renal consult GYN consult IR consult for biopsy. IV fluids.  [name], MD  02/01/2018, 4:34 PM

## 2018-02-02 LAB — CBC
HCT: 32 % — ABNORMAL LOW (ref 36.0–46.0)
HEMOGLOBIN: 10.3 g/dL — AB (ref 12.0–15.0)
MCH: 29.1 pg (ref 26.0–34.0)
MCHC: 32.2 g/dL (ref 30.0–36.0)
MCV: 90.4 fL (ref 78.0–100.0)
Platelets: 425 10*3/uL — ABNORMAL HIGH (ref 150–400)
RBC: 3.54 MIL/uL — ABNORMAL LOW (ref 3.87–5.11)
RDW: 19.2 % — AB (ref 11.5–15.5)
WBC: 7.9 10*3/uL (ref 4.0–10.5)

## 2018-02-02 LAB — BASIC METABOLIC PANEL
Anion gap: 10 (ref 5–15)
BUN: 75 mg/dL — AB (ref 6–20)
CALCIUM: 10.1 mg/dL (ref 8.9–10.3)
CO2: 12 mmol/L — AB (ref 22–32)
CREATININE: 2.69 mg/dL — AB (ref 0.44–1.00)
Chloride: 120 mmol/L — ABNORMAL HIGH (ref 101–111)
GFR calc Af Amer: 21 mL/min — ABNORMAL LOW (ref >60)
GFR, EST NON AFRICAN AMERICAN: 18 mL/min — AB (ref >60)
GLUCOSE: 91 mg/dL (ref 65–99)
Potassium: 4.5 mmol/L (ref 3.5–5.1)
Sodium: 142 mmol/L (ref 135–145)

## 2018-02-02 LAB — CORTISOL-AM, BLOOD: CORTISOL - AM: 13.2 ug/dL (ref 6.7–22.6)

## 2018-02-02 MED ORDER — DILTIAZEM HCL 60 MG PO TABS
60.0000 mg | ORAL_TABLET | Freq: Three times a day (TID) | ORAL | Status: DC
  Administered 2018-02-02 – 2018-02-09 (×20): 60 mg via ORAL
  Filled 2018-02-02 (×20): qty 1

## 2018-02-02 MED ORDER — HEPARIN SODIUM (PORCINE) 5000 UNIT/ML IJ SOLN
5000.0000 [IU] | Freq: Three times a day (TID) | INTRAMUSCULAR | Status: DC
  Administered 2018-02-02 – 2018-02-04 (×5): 5000 [IU] via SUBCUTANEOUS
  Filled 2018-02-02 (×5): qty 1

## 2018-02-02 MED ORDER — SALINE SPRAY 0.65 % NA SOLN
1.0000 | NASAL | Status: DC | PRN
  Administered 2018-02-02 – 2018-02-06 (×3): 1 via NASAL
  Filled 2018-02-02: qty 44

## 2018-02-02 MED ORDER — ATORVASTATIN CALCIUM 40 MG PO TABS
40.0000 mg | ORAL_TABLET | Freq: Every day | ORAL | Status: DC
Start: 2018-02-02 — End: 2018-02-09
  Administered 2018-02-02 – 2018-02-08 (×7): 40 mg via ORAL
  Filled 2018-02-02 (×7): qty 1

## 2018-02-02 MED ORDER — METOPROLOL TARTRATE 25 MG PO TABS
25.0000 mg | ORAL_TABLET | Freq: Three times a day (TID) | ORAL | Status: DC
  Administered 2018-02-02 – 2018-02-09 (×18): 25 mg via ORAL
  Filled 2018-02-02 (×19): qty 1

## 2018-02-02 MED ORDER — METOPROLOL TARTRATE 25 MG PO TABS
25.0000 mg | ORAL_TABLET | Freq: Two times a day (BID) | ORAL | Status: DC
  Administered 2018-02-02: 25 mg via ORAL
  Filled 2018-02-02: qty 1

## 2018-02-02 MED ORDER — DEXTROSE 5 % IV SOLN
INTRAVENOUS | Status: DC
Start: 2018-02-02 — End: 2018-02-09
  Administered 2018-02-02 – 2018-02-08 (×9): via INTRAVENOUS

## 2018-02-02 MED ORDER — AMLODIPINE BESYLATE 5 MG PO TABS
5.0000 mg | ORAL_TABLET | Freq: Every day | ORAL | Status: DC
  Administered 2018-02-02: 5 mg via ORAL
  Filled 2018-02-02: qty 1

## 2018-02-02 MED ORDER — VITAMIN D3 25 MCG (1000 UNIT) PO TABS
1000.0000 [IU] | ORAL_TABLET | Freq: Every day | ORAL | Status: DC
  Administered 2018-02-02 – 2018-02-09 (×7): 1000 [IU] via ORAL
  Filled 2018-02-02 (×7): qty 1

## 2018-02-02 NOTE — Progress Notes (Signed)
Pts BP 96/74. Dr. Doylene Canard paged. Returned page and verbal order to hold lopressor, cardizem, and minipress doses tonight. See MAR. Will continue to monitor pt.

## 2018-02-02 NOTE — Progress Notes (Signed)
PT Cancellation Note  Patient Details Name: Renee Griffin MRN: [id number] DOB: 03-Jun-1956   Cancelled Treatment:    Reason Eval/Treat Not Completed: Medical issues which prohibited therapy  Pt remains in afib and HR 130-150s at rest watching telemetry monitor. RN reports pt has been refusing most care today.  Will check back as schedule permits.   Chloee Tena,KATHrine E 02/02/2018, 2:14 PM Carmelia Bake, PT, DPT 02/02/2018 Pager: [phone]

## 2018-02-02 NOTE — Progress Notes (Signed)
Pt HR 130-150's at 170's non sustained, MD mad aware, EKG completed and confirmed A-fib with RVR. MD mad aware and stated he will place orders. SRP, RN

## 2018-02-02 NOTE — Progress Notes (Signed)
Spoke with pt concerning discharge plan and HH. Pt declined [facility] [place] at present time.

## 2018-02-02 NOTE — Progress Notes (Signed)
Ref: Dixie Dials, MD   Subjective:  Remains nauseated. Poor oral intake. Now in a. fibrillation with rapid ventricular response.  Objective:  Vital Signs in the last 24 hours: Temp:  [97.6 F (36.4 C)-98.6 F (37 C)] 97.6 F (36.4 C) (02/08 1011) Pulse Rate:  [106-125] 125 (02/08 1011) Cardiac Rhythm: Sinus tachycardia (02/08 0700) Resp:  [16-22] 18 (02/08 1011) BP: (125-191)/(66-99) 125/68 (02/08 1011) SpO2:  [97 %-99 %] 97 % (02/08 0430) Weight:  [74.9 kg (165 lb 2 oz)-75.5 kg (166 lb 7.2 oz)] 75.5 kg (166 lb 7.2 oz) (02/08 0430)  Physical Exam: BP Readings from Last 1 Encounters:  02/02/18 125/68     Wt Readings from Last 1 Encounters:  02/02/18 75.5 kg (166 lb 7.2 oz)    Weight change:  Body mass index is 29.48 kg/m. HEENT: [place]/AT, Eyes-Brown, PERL, EOMI, Conjunctiva-Pink, Sclera-Non-icteric. Neck: No JVD, No bruit, Trachea midline. Lungs:  Clear, Bilateral. Cardiac:  Regular rhythm, normal S1 and S2, no S3. II/VI systolic murmur. Abdomen:  Soft, generalized tenderness. BS present. Extremities:  No edema present. No cyanosis. No clubbing. CNS: AxOx3, Cranial nerves grossly intact, moves all 4 extremities.  Skin: Warm and dry.   Intake/Output from previous day: 02/07 0701 - 02/08 0700 In: 2481.7 [I.V.:481.7; IV Piggyback:2000] Out: 300 [Urine:300]    Lab Results: BMET    Component Value Date/Time   NA 142 02/02/2018 0432   NA 140 02/01/2018 1249   NA 140 06/03/2012 0505   K 4.5 02/02/2018 0432   K 5.1 02/01/2018 1249   K 3.0 (L) 06/03/2012 0505   CL 120 (H) 02/02/2018 0432   CL 113 (H) 02/01/2018 1249   CL 102 06/03/2012 0505   CO2 12 (L) 02/02/2018 0432   CO2 11 (L) 02/01/2018 1249   CO2 26 06/03/2012 0505   GLUCOSE 91 02/02/2018 0432   GLUCOSE 124 (H) 02/01/2018 1249   GLUCOSE 111 (H) 06/03/2012 0505   BUN 75 (H) 02/02/2018 0432   BUN 88 (H) 02/01/2018 1249   BUN 19 06/03/2012 0505   CREATININE 2.69 (H) 02/02/2018 0432   CREATININE 3.13 (H)  02/01/2018 1249   CREATININE 1.00 06/03/2012 0505   CALCIUM 10.1 02/02/2018 0432   CALCIUM 10.9 (H) 02/01/2018 1249   CALCIUM 10.8 (H) 06/03/2012 0505   GFRNONAA 18 (L) 02/02/2018 0432   GFRNONAA 15 (L) 02/01/2018 1249   GFRNONAA 62 (L) 06/03/2012 0505   GFRAA 21 (L) 02/02/2018 0432   GFRAA 17 (L) 02/01/2018 1249   GFRAA 72 (L) 06/03/2012 0505   CBC    Component Value Date/Time   WBC 7.9 02/02/2018 0432   RBC 3.54 (L) 02/02/2018 0432   HGB 10.3 (L) 02/02/2018 0432   HCT 32.0 (L) 02/02/2018 0432   PLT 425 (H) 02/02/2018 0432   MCV 90.4 02/02/2018 0432   MCH 29.1 02/02/2018 0432   MCHC 32.2 02/02/2018 0432   RDW 19.2 (H) 02/02/2018 0432   LYMPHSABS 1.1 02/01/2018 1249   MONOABS 1.0 02/01/2018 1249   EOSABS 0.0 02/01/2018 1249   BASOSABS 0.0 02/01/2018 1249   HEPATIC Function Panel Recent Labs    02/01/18 1249  PROT 7.3   HEMOGLOBIN A1C No components found for: HGA1C,  MPG CARDIAC ENZYMES Lab Results  Component Value Date   ATFTDDU 202 (H) 02/01/2018   BNP No results for input(s): PROBNP in the last 8760 hours. TSH No results for input(s): TSH in the last 8760 hours. CHOLESTEROL No results for input(s): CHOL in the last  8760 hours.  Scheduled Meds: . acetaminophen-codeine  1 tablet Oral QHS  . ALPRAZolam  0.25 mg Oral BID  . atorvastatin  40 mg Oral q1800  . cholecalciferol  1,000 Units Oral Daily  . diltiazem  60 mg Oral Q8H  . metoprolol tartrate  25 mg Oral TID  . prazosin  1 mg Oral BID   Continuous Infusions: . dextrose 75 mL/hr at 02/02/18 1012   PRN Meds:.acetaminophen, ondansetron **OR** ondansetron (ZOFRAN) IV, sodium chloride  Assessment/Plan: Dehydration Acute renal failure form above Abdominal pain Nausea Large left adrenal mass Large left ovarian mass Hypertension Tobacco use disorder Atrial fibrillation with RVR, [id number] score of 3  Add diltiazem Increase metoprolol Change IV fluid to D5W at 75 cc/hr.   LOS: 1 day    Dixie Dials  MD  02/02/2018, 12:13 PM

## 2018-02-03 LAB — BASIC METABOLIC PANEL
ANION GAP: 8 (ref 5–15)
BUN: 71 mg/dL — ABNORMAL HIGH (ref 6–20)
CALCIUM: 10.1 mg/dL (ref 8.9–10.3)
CO2: 15 mmol/L — ABNORMAL LOW (ref 22–32)
Chloride: 116 mmol/L — ABNORMAL HIGH (ref 101–111)
Creatinine, Ser: 2.27 mg/dL — ABNORMAL HIGH (ref 0.44–1.00)
GFR, EST AFRICAN AMERICAN: 25 mL/min — AB (ref >60)
GFR, EST NON AFRICAN AMERICAN: 22 mL/min — AB (ref >60)
Glucose, Bld: 114 mg/dL — ABNORMAL HIGH (ref 65–99)
POTASSIUM: 3.8 mmol/L (ref 3.5–5.1)
Sodium: 139 mmol/L (ref 135–145)

## 2018-02-03 LAB — HIV ANTIBODY (ROUTINE TESTING W REFLEX): HIV Screen 4th Generation wRfx: NONREACTIVE

## 2018-02-03 LAB — OCCULT BLOOD X 1 CARD TO LAB, STOOL: FECAL OCCULT BLD: POSITIVE — AB

## 2018-02-03 NOTE — Progress Notes (Signed)
MD paged with update of stool occult result..positive results. SRP, RN

## 2018-02-03 NOTE — Progress Notes (Signed)
OT Cancellation Note  Patient Details Name: Renee Griffin MRN: [id number] DOB: 1956/02/22   Cancelled Treatment:    Reason Eval/Treat Not Completed: Patient declined, no reason specified  Rickell Wiehe 02/03/2018, 2:46 PM  Lesle Chris, OTR/L [phone] 02/03/2018

## 2018-02-03 NOTE — Progress Notes (Signed)
PT Cancellation Note  Patient Details Name: MARIABELEN PRESSLY MRN: [id number] DOB: 17-Mar-1956   Cancelled Treatment:     PT order received but eval deferred at pt request 2* fatigue.  "All I want to do is just lay here".  Will follow.   Ermalee Mealy 02/03/2018, 3:41 PM

## 2018-02-03 NOTE — Progress Notes (Signed)
Subjective: Patient reports no new complaints.  Objective: Vital signs in last 24 hours: Temp:  [97.6 F (36.4 C)-98.4 F (36.9 C)] 98.4 F (36.9 C) (02/09 0412) Pulse Rate:  [63-125] 121 (02/09 0404) Resp:  [16-18] 18 (02/09 0404) BP: (93-134)/(64-74) 127/67 (02/09 0544) SpO2:  [97 %-99 %] 98 % (02/09 0404) Weight:  [75.9 kg (167 lb 6.4 oz)] 75.9 kg (167 lb 6.4 oz) (02/09 0412)  Intake/Output from previous day: 02/08 0701 - 02/09 0700 In: 1945 [P.O.:460; I.V.:1485] Out: 725 [Urine:725] Intake/Output this shift: No intake/output data recorded.  Physical Exam:  General:alert, cooperative and mild distress   Lab Results: Recent Labs    02/01/18 1249 02/02/18 0432  HGB 11.8* 10.3*  HCT 36.0 32.0*   BMET Recent Labs    02/02/18 0432 02/03/18 0423  NA 142 139  K 4.5 3.8  CL 120* 116*  CO2 12* 15*  GLUCOSE 91 114*  BUN 75* 71*  CREATININE 2.69* 2.27*  CALCIUM 10.1 10.1   No results for input(s): LABPT, INR in the last 72 hours. No results for input(s): LABURIN in the last 72 hours. No results found for this or any previous visit.  Studies/Results: Ct Abdomen Pelvis Wo Contrast  Result Date: 02/01/2018 CLINICAL DATA:  Unwitnessed fall. EXAM: CT ABDOMEN AND PELVIS WITHOUT CONTRAST TECHNIQUE: Multidetector CT imaging of the abdomen and pelvis was performed following the standard protocol without IV contrast. COMPARISON:  None. FINDINGS: Lower chest: No acute abnormality. Hepatobiliary: No gallstones are noted. 11 mm low density is noted in right hepatic lobe which may represent cyst but is not definitive on the basis of this study. Pancreas: Unremarkable. No pancreatic ductal dilatation or surrounding inflammatory changes. Spleen: Normal in size without focal abnormality. Adrenals/Urinary Tract: 3.3 cm right adrenal adenoma is noted. 10.3 cm solid left adrenal mass is noted concerning for neoplasm or metastatic disease. Multiple rounded exophytic rounded lesions are  seen involving both kidneys which may represent hyperdense cysts, but neoplasm cannot be excluded. This includes 2.1 cm rounded mass arising from lower pole of right kidney. Nonobstructive bilateral nephrolithiasis is noted. No hydronephrosis or renal obstruction is noted. Urinary bladder is unremarkable. Stomach/Bowel: Stomach is within normal limits. Appendix appears normal. No evidence of bowel wall thickening, distention, or inflammatory changes. Vascular/Lymphatic: Aortic atherosclerosis. No enlarged abdominal or pelvic lymph nodes. Reproductive: Uterus and right ovary unremarkable. 7.5 cm complex septated cystic abnormality appears to be rising from the left ovary. Pelvic ultrasound is recommended for further evaluation. Other: No abdominal wall hernia or abnormality. No abdominopelvic ascites. Musculoskeletal: Multilevel degenerative disc disease is noted in the lumbar spine. IMPRESSION: 10.3 cm solid left adrenal mass is noted concerning for neoplasm or metastatic disease. Urologic consultation is recommended. 7.5 cm complex septated probable left ovarian cystic abnormality is noted. This is concerning for possible neoplasm. Pelvic ultrasound is recommended for further evaluation. Bilateral nonobstructive nephrolithiasis. 3.3 cm right adrenal adenoma. 11 mm low density seen in right hepatic lobe cyst statistically most likely representing cyst. Electronically Signed   By: Marijo Conception, M.D.   On: 02/01/2018 15:06   US Pelvis Complete  Result Date: 02/01/2018 CLINICAL DATA:  Pelvic mass seen on CT. EXAM: TRANSABDOMINAL AND TRANSVAGINAL ULTRASOUND OF PELVIS TECHNIQUE: Both transabdominal and transvaginal ultrasound examinations of the pelvis were performed. Transabdominal technique was performed for global imaging of the pelvis including uterus, ovaries, adnexal regions, and pelvic cul-de-sac. It was necessary to proceed with endovaginal exam following the transabdominal exam to visualize the left ovary.  COMPARISON:  Body CT 02/01/2018 FINDINGS: Uterus Measurements: 6.7 x 2.7 x 4.3 cm. No fibroids or other mass visualized. Endometrium Thickness: 2.4 mm.  No focal abnormality visualized. Right ovary Not clearly identified. 2.1 x 1.8 x 1.9 cm right adnexal simple appearing cyst. Left ovary Not clearly identified. There is a complex cystic structure with internal septations in the left adnexa which measures 8.2 x 6.3 x 7.1 cm. Other findings No abnormal free fluid. IMPRESSION: A normal appearance of the uterus. The ovaries are not definitively identified. Small simple appearing right adnexal cyst. Complex cystic mass versus complicated cyst in the left adnexa measures 8.2 cm. Electronically Signed   By: Fidela Salisbury M.D.   On: 02/01/2018 18:16   Dg Chest Portable 1 View  Result Date: 02/01/2018 CLINICAL DATA:  Weakness and shortness of Breath EXAM: PORTABLE CHEST 1 VIEW COMPARISON:  06/03/2012 FINDINGS: Cardiac shadow is accentuated by the portable technique. Elevation of the right hemidiaphragm is noted. The lungs are well aerated bilaterally without focal infiltrate or sizable effusion. No acute bony abnormality is seen. Postsurgical changes are again noted. IMPRESSION: No active disease. Electronically Signed   By: Inez Catalina M.D.   On: 02/01/2018 12:56    Assessment/Plan: Left adrenal mass: Her a.m. cortisol level was found to be 13.2.  The remainder of her adrenal mass laboratory evaluation remains pending at this time. Her blood pressure appears to be under better control at this time.  Adrenal mass  Await results of adrenal mass metabolic evaluation   LOS: 2 days   Jorene Kaylor C 02/03/2018, 8:57 AM

## 2018-02-03 NOTE — Progress Notes (Signed)
. RefDixie Dials, MD   Subjective:  Awake but anxious. Poor oral intake. Abdominal discomfort persist. Creatinine now 2.27 from 3.13 on admission.  Objective:  Vital Signs in the last 24 hours: Temp:  [97.6 F (36.4 C)-98.4 F (36.9 C)] 98.4 F (36.9 C) (02/09 0412) Pulse Rate:  [63-121] 117 (02/09 1058) Resp:  [16-18] 18 (02/09 0404) BP: (93-134)/(64-77) 107/77 (02/09 1058) SpO2:  [97 %-99 %] 98 % (02/09 0404) Weight:  [75.9 kg (167 lb 6.4 oz)] 75.9 kg (167 lb 6.4 oz) (02/09 0412)  Physical Exam: BP Readings from Last 1 Encounters:  02/03/18 107/77     Wt Readings from Last 1 Encounters:  02/03/18 75.9 kg (167 lb 6.4 oz)    Weight change: 1.032 kg (2 lb 4.4 oz) Body mass index is 29.65 kg/m. HEENT: [place]/AT, Eyes-Brown, PERL, EOMI, Conjunctiva-Pink, Sclera-Non-icteric Neck: No JVD, No bruit, Trachea midline. Lungs:  Clear, Bilateral. Cardiac:  Regular rhythm, normal S1 and S2, no S3. II/VI systolic murmur. Abdomen:  Soft, epigastric and umbilical area-tender. BS present. Extremities:  No edema present. No cyanosis. No clubbing. CNS: AxOx3, Cranial nerves grossly intact, moves all 4 extremities.  Skin: Warm and dry.   Intake/Output from previous day: 02/08 0701 - 02/09 0700 In: 1945 [P.O.:460; I.V.:1485] Out: 725 [Urine:725]    Lab Results: BMET    Component Value Date/Time   NA 139 02/03/2018 0423   NA 142 02/02/2018 0432   NA 140 02/01/2018 1249   K 3.8 02/03/2018 0423   K 4.5 02/02/2018 0432   K 5.1 02/01/2018 1249   CL 116 (H) 02/03/2018 0423   CL 120 (H) 02/02/2018 0432   CL 113 (H) 02/01/2018 1249   CO2 15 (L) 02/03/2018 0423   CO2 12 (L) 02/02/2018 0432   CO2 11 (L) 02/01/2018 1249   GLUCOSE 114 (H) 02/03/2018 0423   GLUCOSE 91 02/02/2018 0432   GLUCOSE 124 (H) 02/01/2018 1249   BUN 71 (H) 02/03/2018 0423   BUN 75 (H) 02/02/2018 0432   BUN 88 (H) 02/01/2018 1249   CREATININE 2.27 (H) 02/03/2018 0423   CREATININE 2.69 (H) 02/02/2018 0432    CREATININE 3.13 (H) 02/01/2018 1249   CALCIUM 10.1 02/03/2018 0423   CALCIUM 10.1 02/02/2018 0432   CALCIUM 10.9 (H) 02/01/2018 1249   GFRNONAA 22 (L) 02/03/2018 0423   GFRNONAA 18 (L) 02/02/2018 0432   GFRNONAA 15 (L) 02/01/2018 1249   GFRAA 25 (L) 02/03/2018 0423   GFRAA 21 (L) 02/02/2018 0432   GFRAA 17 (L) 02/01/2018 1249   CBC    Component Value Date/Time   WBC 7.9 02/02/2018 0432   RBC 3.54 (L) 02/02/2018 0432   HGB 10.3 (L) 02/02/2018 0432   HCT 32.0 (L) 02/02/2018 0432   PLT 425 (H) 02/02/2018 0432   MCV 90.4 02/02/2018 0432   MCH 29.1 02/02/2018 0432   MCHC 32.2 02/02/2018 0432   RDW 19.2 (H) 02/02/2018 0432   LYMPHSABS 1.1 02/01/2018 1249   MONOABS 1.0 02/01/2018 1249   EOSABS 0.0 02/01/2018 1249   BASOSABS 0.0 02/01/2018 1249   HEPATIC Function Panel Recent Labs    02/01/18 1249  PROT 7.3   HEMOGLOBIN A1C No components found for: HGA1C,  MPG CARDIAC ENZYMES Lab Results  Component Value Date   XBDZHGD 924 (H) 02/01/2018   BNP No results for input(s): PROBNP in the last 8760 hours. TSH No results for input(s): TSH in the last 8760 hours. CHOLESTEROL No results for input(s): CHOL in the  last 8760 hours.  Scheduled Meds: . acetaminophen-codeine  1 tablet Oral QHS  . ALPRAZolam  0.25 mg Oral BID  . atorvastatin  40 mg Oral q1800  . cholecalciferol  1,000 Units Oral Daily  . diltiazem  60 mg Oral Q8H  . heparin injection (subcutaneous)  5,000 Units Subcutaneous Q8H  . metoprolol tartrate  25 mg Oral TID  . prazosin  1 mg Oral BID   Continuous Infusions: . dextrose 75 mL/hr at 02/03/18 1059   PRN Meds:.acetaminophen, ondansetron **OR** ondansetron (ZOFRAN) IV, sodium chloride  Assessment/Plan: Dehydration Acute renal failure from above Abdominal pain Nausea Large left adrenal mass Large left ovarian mass or cyst Hypertension Tobacco use disorder Atrial fibrillation with RVR  Advance diet as tolerated. Appreciate urology  consult.  Advance diet   LOS: 2 days    Renee Dials  MD  02/03/2018, 11:40 AM

## 2018-02-04 DIAGNOSIS — N838 Other noninflammatory disorders of ovary, fallopian tube and broad ligament: Secondary | ICD-10-CM

## 2018-02-04 DIAGNOSIS — N839 Noninflammatory disorder of ovary, fallopian tube and broad ligament, unspecified: Secondary | ICD-10-CM

## 2018-02-04 LAB — BASIC METABOLIC PANEL
Anion gap: 8 (ref 5–15)
BUN: 60 mg/dL — ABNORMAL HIGH (ref 6–20)
CALCIUM: 9.3 mg/dL (ref 8.9–10.3)
CO2: 15 mmol/L — AB (ref 22–32)
CREATININE: 1.91 mg/dL — AB (ref 0.44–1.00)
Chloride: 112 mmol/L — ABNORMAL HIGH (ref 101–111)
GFR calc non Af Amer: 27 mL/min — ABNORMAL LOW (ref >60)
GFR, EST AFRICAN AMERICAN: 31 mL/min — AB (ref >60)
Glucose, Bld: 116 mg/dL — ABNORMAL HIGH (ref 65–99)
Potassium: 3.6 mmol/L (ref 3.5–5.1)
SODIUM: 135 mmol/L (ref 135–145)

## 2018-02-04 MED ORDER — PANTOPRAZOLE SODIUM 40 MG IV SOLR
40.0000 mg | INTRAVENOUS | Status: DC
  Administered 2018-02-04 – 2018-02-05 (×2): 40 mg via INTRAVENOUS

## 2018-02-04 MED ORDER — DEXTROSE 5 % IV BOLUS
100.0000 mL | Freq: Once | INTRAVENOUS | Status: AC
  Administered 2018-02-04: 100 mL via INTRAVENOUS

## 2018-02-04 MED ORDER — POTASSIUM CHLORIDE ER 10 MEQ PO TBCR
10.0000 meq | EXTENDED_RELEASE_TABLET | Freq: Every day | ORAL | Status: DC
  Administered 2018-02-04: 10 meq via ORAL
  Filled 2018-02-04 (×4): qty 1

## 2018-02-04 MED ORDER — NICOTINE 14 MG/24HR TD PT24
14.0000 mg | MEDICATED_PATCH | Freq: Every day | TRANSDERMAL | Status: DC
  Administered 2018-02-04 – 2018-02-09 (×5): 14 mg via TRANSDERMAL
  Filled 2018-02-04 (×5): qty 1

## 2018-02-04 NOTE — Consult Note (Signed)
[facility] Consult  Referring Provider: Dixie Dials, MD Primary Care Physician:  Dixie Dials, MD Primary Gastroenterologist: Althia Forts  Reason for Consultation:  Drop in hemoglobin, dark stools, FOBT positive  HPI: Renee Griffin is a 62 y.o. female was admitted on 02/01/2018 with complaints of generalized weakness. Patient is a very poor historian. It seems patient had decreased appetite, generalized weakness, decreased oral intake and was on the floor for 2 days before she was brought into the ER. EMS was called from a life alert service and patient was found laying on the floor covered in pieces. Patient states she was too weak to get up, denied falling. On presentation she was oriented to place and person but not to time. She was admitted with impaired renal function. Workup with a CAT scan of abdomen and pelvis showed a large left adrenal mass and a large left ovarian mass. Meanwhile patient dropped her blood count from 11.8-10.3, stool sent yesterday for occult blood tested positive.  Patient denies bloody bowel movement of black stools at home. She states her last colonoscopy was over 10 years ago in [place], she is unsure of the indication or findings. Denies abdominal pain, nausea, vomiting, difficulty swallowing or pain on swallowing. No prior endoscopy. No history of NSAID or aspirin use. Patient denies early satiety or bloating but complains of decreased appetite. Last bowel movement yesterday.  Father died of lung cancer, mother died of ovarian cancer, one sister died of breast cancer and a brother died of leukemia.     Past Medical History:  Diagnosis Date  . Depression   . Diabetes mellitus without complication ([place])   . Heart disease, congenital   . High cholesterol   . Hypertension   . Panic attack   . Stroke [facility])     Past Surgical History:  Procedure Laterality Date  . CARDIAC SURGERY  2011   CABG  . WISDOM TOOTH EXTRACTION      Prior to  Admission medications   Medication Sig Start Date End Date Taking? Authorizing Provider  acetaminophen-codeine (TYLENOL #3) 300-30 MG per tablet Take 1-2 tablets by mouth daily as needed for moderate pain. 03/05/15  Yes Rosalin Hawking, MD  amLODipine (NORVASC) 5 MG tablet Take 5 mg by mouth daily. 01/30/16  Yes [provider]  atorvastatin (LIPITOR) 40 MG tablet TAKE 1 TABLET (40 MG TOTAL) BY MOUTH DAILY. 07/30/15  Yes Rosalin Hawking, MD  Cholecalciferol (VITAMIN D-3) 1000 UNITS CAPS Take 1,000 Units by mouth daily.   Yes [provider]  metoprolol tartrate (LOPRESSOR) 25 MG tablet Take 1 tablet (25 mg total) by mouth 2 (two) times daily. 06/04/12  Yes Oti, Brantley Stage, MD  Multiple Vitamin (MULTIVITAMIN WITH MINERALS) TABS tablet Take 1 tablet by mouth daily.   Yes [provider]  vitamin C (ASCORBIC ACID) 500 MG tablet Take by mouth.   Yes [provider]  ACCU-CHEK AVIVA PLUS test strip See admin instructions. 01/29/16   [provider]  ACCU-CHEK SOFTCLIX LANCETS lancets See admin instructions. 01/30/16   [provider]  Blood Glucose Monitoring Suppl (ACCU-CHEK AVIVA PLUS) w/Device KIT See admin instructions. 01/29/16   [provider]  methocarbamol (ROBAXIN) 500 MG tablet Take 1 tablet (500 mg total) by mouth 3 (three) times daily. Patient not taking: Reported on 02/01/2018 11/29/13   Harden Mo, MD    Current Facility-Administered Medications  Medication Dose Route Frequency Provider Last Rate Last Dose  . acetaminophen (TYLENOL) tablet 325 mg  325 mg Oral Q6H PRN Dixie Dials, MD   325 mg at 02/03/18 1859  . acetaminophen-codeine (TYLENOL #3) 300-30 MG per tablet 1 tablet  1 tablet Oral QHS Dixie Dials, MD   1 tablet at 02/03/18 2220  . ALPRAZolam Duanne Moron) tablet 0.25 mg  0.25 mg Oral BID Dixie Dials, MD   0.25 mg at 02/04/18 1027  . atorvastatin (LIPITOR) tablet 40 mg  40 mg Oral q1800 Dixie Dials, MD   40 mg at 02/03/18 1818  .  cholecalciferol (VITAMIN D) tablet 1,000 Units  1,000 Units Oral Daily Dixie Dials, MD   1,000 Units at 02/04/18 1027  . dextrose 5 % solution   Intravenous Continuous Dixie Dials, MD 30 mL/hr at 02/04/18 1027    . diltiazem (CARDIZEM) tablet 60 mg  60 mg Oral Q8H Dixie Dials, MD   60 mg at 02/04/18 0606  . heparin injection 5,000 Units  5,000 Units Subcutaneous Q8H Dixie Dials, MD   5,000 Units at 02/04/18 0607  . metoprolol tartrate (LOPRESSOR) tablet 25 mg  25 mg Oral TID Dixie Dials, MD   25 mg at 02/04/18 1027  . nicotine (NICODERM CQ - dosed in mg/24 hours) patch 14 mg  14 mg Transdermal Daily Dixie Dials, MD   14 mg at 02/04/18 1056  . ondansetron (ZOFRAN) tablet 4 mg  4 mg Oral Q6H PRN Dixie Dials, MD       Or  . ondansetron (ZOFRAN) injection 4 mg  4 mg Intravenous Q6H PRN Dixie Dials, MD   4 mg at 02/04/18 1054  . pantoprazole (PROTONIX) injection 40 mg  40 mg Intravenous Q24H Dixie Dials, MD   40 mg at 02/04/18 1056  . potassium chloride (K-DUR) CR tablet 10 mEq  10 mEq Oral Daily Dixie Dials, MD   10 mEq at 02/04/18 1027  . prazosin (MINIPRESS) capsule 1 mg  1 mg Oral BID Cleon Gustin, MD   1 mg at 02/04/18 1027  . sodium chloride (OCEAN) 0.65 % nasal spray 1 spray  1 spray Each Nare PRN Dixie Dials, MD   1 spray at 02/02/18 0429    Allergies as of 02/01/2018 - Review Complete 02/01/2018  Allergen Reaction Noted  . Amoxicillin-pot clavulanate Diarrhea 07/11/2014  . Bupropion Nausea Only 07/11/2014  . Nicotine Nausea Only 07/11/2014    Family History  Problem Relation Age of Onset  . Cancer Mother        uterian OR cervical  . Cancer Father        lung  . Leukemia Brother   . Cancer Sister        breast    Social History   Socioeconomic History  . Marital status: Widowed    Spouse name: Not on file  . Number of children: Not on file  . Years of education: Not on file  . Highest education level: Not on file  Social Needs  . Financial  resource strain: Not on file  . Food insecurity - worry: Not on file  . Food insecurity - inability: Not on file  . Transportation needs - medical: Not on file  . Transportation needs - non-medical: Not on file  Occupational History  . Not on file  Tobacco Use  . Smoking status: Current Every Day Smoker    Packs/day: 1.00    Years: 50.00    Pack years: 50.00    Types: Cigarettes  . Smokeless tobacco: Never Used  Substance and Sexual Activity  . Alcohol  use: No    Alcohol/week: 0.0 oz  . Drug use: No  . Sexual activity: No  Other Topics Concern  . Not on file  Social History Narrative  . Not on file    Review of Systems: Positive for: GI: Described in detail in HPI.    Gen: anorexia, fatigue, weakness, malaise, Denies any fever, chills, rigors, night sweats, involuntary weight loss, and sleep disorder CV: Denies chest pain, angina, palpitations, syncope, orthopnea, PND, peripheral edema, and claudication. Resp: Denies dyspnea, cough, sputum, wheezing, coughing up blood. GU : Denies urinary burning, blood in urine, urinary frequency, urinary hesitancy, nocturnal urination, and urinary incontinence. MS: Denies joint pain or swelling.  Denies muscle weakness, cramps, atrophy.  Derm: Denies rash, itching, oral ulcerations, hives, unhealing ulcers.  Psych: depression, anxiety, confusion,Denies memory loss, suicidal ideation, hallucinations. Heme: Denies bruising, bleeding, and enlarged lymph nodes. Neuro:  Denies any headaches, dizziness, paresthesias. Endo:  Denies any problems with DM, thyroid, adrenal function.  Physical Exam: Vital signs in last 24 hours: Temp:  [98 F (36.7 C)-98.1 F (36.7 C)] 98 F (36.7 C) (02/10 0439) Pulse Rate:  [66-93] 68 (02/10 0439) Resp:  [16-21] 21 (02/10 0439) BP: (90-125)/(56-92) 125/81 (02/10 0606) SpO2:  [98 %-99 %] 98 % (02/10 0439) Weight:  [76.9 kg (169 lb 8.5 oz)] 76.9 kg (169 lb 8.5 oz) (02/10 0439) Last BM Date:  02/03/18  General:   Alert,  Well-developed, well-nourished, appears disheveled  Head:  Normocephalic and atraumatic. Eyes:  Sclera clear, no icterus.  Mild pallor Ears:  Normal auditory acuity. Nose:  No deformity, discharge,  or lesions. Mouth:  No deformity or lesions.  Oropharynx dry. Neck:  Supple; no masses or thyromegaly. Lungs:  Clear throughout to auscultation.   No wheezes, crackles, or rhonchi. No acute distress. Heart:  Regular rate and rhythm; no murmurs, clicks, rubs,  or gallops. Extremities:  Without clubbing or edema. Neurologic:  Alert and  oriented x3;  grossly normal neurologically. Follows commands and answers appropriately. Skin:  Intact without significant lesions or rashes. Psych:  Alert and cooperative. Normal mood and affect. Abdomen:  Epigastric/ventral hernia ,Soft, nontender and nondistended. No masses, hepatosplenomegaly or hernias noted. Normal bowel sounds, without guarding, and without rebound.         Lab Results: Recent Labs    02/02/18 0432  WBC 7.9  HGB 10.3*  HCT 32.0*  PLT 425*   BMET Recent Labs    02/02/18 0432 02/03/18 0423 02/04/18 0419  NA 142 139 135  K 4.5 3.8 3.6  CL 120* 116* 112*  CO2 12* 15* 15*  GLUCOSE 91 114* 116*  BUN 75* 71* 60*  CREATININE 2.69* 2.27* 1.91*  CALCIUM 10.1 10.1 9.3   LFT No results for input(s): PROT, ALBUMIN, AST, ALT, ALKPHOS, BILITOT, BILIDIR, IBILI in the last 72 hours. PT/INR No results for input(s): LABPROT, INR in the last 72 hours.  Studies/Results: No results found.  Impression: 1. Anemia, FOBT positive stool, hemoglobin dropped from 11.8-10.3  2. Renal impairment(BUN/creatinine/GFR has improved from 18 8/3.13/17 to 60/1.91/31). Acidosis bicarbonate improved from 11-15. Leukocytosis 16.3 on admission has improved to 7.9. Thrombocytosis, platelets improved from 666-425.  3. 11 mm right hepatic cyst, normal LFTs  4. 3.3 cm right adrenal adenoma 10.3 cm solid left adrenal  adenoma concerning for neoplasm or metastatic disease. Multiple rounded exophytic lesions in both kidneys. 2.1 cm rounded mass in lower pole of right kidney.  5. 7.5 cm complex septated cystic left ovarian mass Ultrasound  showed complex cystic mass versus complicated cyst in the left adnexa measuring 8.2 cm. CA-125 pending.   Plan: Possible upper GI bleed(peptic ulcer disease/erosive gastritis) as FOBT was positive, versus drop in hemoglobin with fluid resuscitation(hemodilution), not on IV fluids currently. On Protonix 40 mg IV every 24 hours. Hemodynamically stable  Discussed about the risk and benefits of a diagnostic endoscopy. Patient understands and verbalizes consent for EGD. Will keep patient nothing by mouth post midnight in anticipation of endoscopy in a.m..      LOS: 3 days   Ronnette Juniper, M.D.  02/04/2018, 12:56 PM  Pager [phone] If no answer or after 5 PM call [phone]

## 2018-02-04 NOTE — Progress Notes (Signed)
PT Cancellation Note  Patient Details Name: Renee Griffin MRN: [id number] DOB: June 30, 1956   Cancelled Treatment:    Reason Eval/Treat Not Completed: Spoke with OT who reported and noted that pt has declined participation with therapies on today. Will check back another day.   Weston Anna, MPT Pager: [phone]

## 2018-02-04 NOTE — Progress Notes (Signed)
Ref: Dixie Dials, MD   Subjective:  Small improvement in oral intake. Probably going through nicotine withdrawal. Nausea continues. Afebrile. Creatinine 1.91.  Objective:  Vital Signs in the last 24 hours: Temp:  [98 F (36.7 C)-98.1 F (36.7 C)] 98 F (36.7 C) (02/10 0439) Pulse Rate:  [66-117] 68 (02/10 0439) Cardiac Rhythm: Atrial fibrillation (02/09 1900) Resp:  [16-21] 21 (02/10 0439) BP: (90-125)/(56-92) 125/81 (02/10 0606) SpO2:  [98 %-99 %] 98 % (02/10 0439) Weight:  [76.9 kg (169 lb 8.5 oz)] 76.9 kg (169 lb 8.5 oz) (02/10 0439)  Physical Exam: BP Readings from Last 1 Encounters:  02/04/18 125/81     Wt Readings from Last 1 Encounters:  02/04/18 76.9 kg (169 lb 8.5 oz)    Weight change: 0.968 kg (2 lb 2.1 oz) Body mass index is 30.03 kg/m. HEENT: [place]/AT, Eyes-Brown, PERL, EOMI, Conjunctiva-Pink, Sclera-Non-icteric Neck: No JVD, No bruit, Trachea midline. Lungs:  Clear, Bilateral. Cardiac:  Regular rhythm, normal S1 and S2, no S3. II/VI systolic murmur. Abdomen:  Soft, epigastric and umbilical area-tender. BS present. Extremities:  No edema present. No cyanosis. No clubbing. CNS: AxOx3, Cranial nerves grossly intact, moves all 4 extremities.  Skin: Warm and dry.   Intake/Output from previous day: 02/09 0701 - 02/10 0700 In: 1633.5 [P.O.:100; I.V.:1533.5] Out: 202 [Urine:200; Stool:2]    Lab Results: BMET    Component Value Date/Time   NA 135 02/04/2018 0419   NA 139 02/03/2018 0423   NA 142 02/02/2018 0432   K 3.6 02/04/2018 0419   K 3.8 02/03/2018 0423   K 4.5 02/02/2018 0432   CL 112 (H) 02/04/2018 0419   CL 116 (H) 02/03/2018 0423   CL 120 (H) 02/02/2018 0432   CO2 15 (L) 02/04/2018 0419   CO2 15 (L) 02/03/2018 0423   CO2 12 (L) 02/02/2018 0432   GLUCOSE 116 (H) 02/04/2018 0419   GLUCOSE 114 (H) 02/03/2018 0423   GLUCOSE 91 02/02/2018 0432   BUN 60 (H) 02/04/2018 0419   BUN 71 (H) 02/03/2018 0423   BUN 75 (H) 02/02/2018 0432   CREATININE  1.91 (H) 02/04/2018 0419   CREATININE 2.27 (H) 02/03/2018 0423   CREATININE 2.69 (H) 02/02/2018 0432   CALCIUM 9.3 02/04/2018 0419   CALCIUM 10.1 02/03/2018 0423   CALCIUM 10.1 02/02/2018 0432   GFRNONAA 27 (L) 02/04/2018 0419   GFRNONAA 22 (L) 02/03/2018 0423   GFRNONAA 18 (L) 02/02/2018 0432   GFRAA 31 (L) 02/04/2018 0419   GFRAA 25 (L) 02/03/2018 0423   GFRAA 21 (L) 02/02/2018 0432   CBC    Component Value Date/Time   WBC 7.9 02/02/2018 0432   RBC 3.54 (L) 02/02/2018 0432   HGB 10.3 (L) 02/02/2018 0432   HCT 32.0 (L) 02/02/2018 0432   PLT 425 (H) 02/02/2018 0432   MCV 90.4 02/02/2018 0432   MCH 29.1 02/02/2018 0432   MCHC 32.2 02/02/2018 0432   RDW 19.2 (H) 02/02/2018 0432   LYMPHSABS 1.1 02/01/2018 1249   MONOABS 1.0 02/01/2018 1249   EOSABS 0.0 02/01/2018 1249   BASOSABS 0.0 02/01/2018 1249   HEPATIC Function Panel Recent Labs    02/01/18 1249  PROT 7.3   HEMOGLOBIN A1C No components found for: HGA1C,  MPG CARDIAC ENZYMES Lab Results  Component Value Date   ZTIWPYK 998 (H) 02/01/2018   BNP No results for input(s): PROBNP in the last 8760 hours. TSH No results for input(s): TSH in the last 8760 hours. CHOLESTEROL No results for  input(s): CHOL in the last 8760 hours.  Scheduled Meds: . acetaminophen-codeine  1 tablet Oral QHS  . ALPRAZolam  0.25 mg Oral BID  . atorvastatin  40 mg Oral q1800  . cholecalciferol  1,000 Units Oral Daily  . diltiazem  60 mg Oral Q8H  . heparin injection (subcutaneous)  5,000 Units Subcutaneous Q8H  . metoprolol tartrate  25 mg Oral TID  . potassium chloride  10 mEq Oral Daily  . prazosin  1 mg Oral BID   Continuous Infusions: . dextrose 30 mL/hr at 02/04/18 1027   PRN Meds:.acetaminophen, ondansetron **OR** ondansetron (ZOFRAN) IV, sodium chloride  Assessment/Plan: Dehydration- improved Acute renal failure on CKD, III Abdominal pain Nausea Large adrenal mass Large left ovarian mass or cyst Hypertension Acute GI  bleed Anemia of blood loss r/o iron deficiency Tobacco use disorder Atrial fibrillation with CVR  Continue soft diet. Decrease IV fluid. GI consult. Add nicotine patch. Oncology consult in AM.   LOS: 3 days    Dixie Dials  MD  02/04/2018, 10:38 AM

## 2018-02-04 NOTE — Progress Notes (Signed)
Patient up to [facility].  Tolerated very well.  Encouraged patient to sit in recliner; patient reluctant but able to do so with walker, again tolerated well.  Patient had been in chair for approximately 5 minutes when she attempted to scoot off chair, threw herself back in the recliner and kicked her legs and said 'get me out of here back into the bed, I just can't do this'.  Attempted to educate patient regarding importance of physical activity, sitting up in chair, using different muscles, agreeing to participate in her care I.e. Physical therapy and occupational therapy.  Patient refused, stating 'I just can't'.  Patient back to bed.  Patient has been refusing to participate in her care today, refusing nursing and therapy care.  Will continue to monitor.

## 2018-02-04 NOTE — Progress Notes (Signed)
Pt BP low. Checked automatically and manually. Pt is asymptomatic. Pulse in low 60s. Tele notified this RN of 2.5 second pause. Dr. Doylene Canard office paged. Awaiting call back.

## 2018-02-04 NOTE — Progress Notes (Signed)
OT Cancellation Note  Patient Details Name: Renee Griffin MRN: [id number] DOB: 07/12/1956   Cancelled Treatment:    Reason Eval/Treat Not Completed: Fatigue/lethargy limiting ability to participate.  Pt doesn't feel up to any ADL or OOB activity.  Bob Eastwood 02/04/2018, 8:48 AM  Lesle Chris, OTR/L [phone] 02/04/2018

## 2018-02-04 NOTE — Consult Note (Signed)
Reason for Consult:8 cm cyst left ovary Referring Physician: Dixie Dials, MD  Renee Griffin is an 62 y.o. female. With multiple medical problems admitted to the hospital on 02/01/2018 for abdominal pain, loss of appetite, weakness. Initial evaluation included a CT scan which reveals a 10 cm left adrenal mass concerning for a neoplasm, a 3 cm right adrenal adenoma and an 8 cm left ovarian cystic mass.  I have reviewed all of  the images of both scans and find there is no significant free fluid in the abdomen and no other findings on either scan that would be acutely worrisome for malignant ovarian neoplasm, there are a couple of thin septations, but I would not call the mass complex, there are no excrescenses on the surface and again internally there are not worrisome features.  Additionally, the peri ovarian findings blood flow pattern does not suggest torsion.  The mass is also unilateral.  Given this, the mass could certainly cause some discomfort in the LLQ to the midline it would be limited to symptoms in this area and I think is unlikely to be responsible for the overall pain complex the patient has been having.    Assessment/Plan: My impression is that this most likely represents a mucinous or serous cystadenoma of the ovary, a benign ovarian neoplasm, and is very unlikely to be related in any way to the large left adrenal mass.  There are no previous scans of the abdomen or pelvis that I am able to locate either in Epic or through the care everywhere portal. This could be an ovarian malignancy, low malignant potential or frankly malignant, and should be pathologically determined definitively,  but its characteristics make that less likely than a benign ovarian neoplasm.  As a result, a CA 125 is ordered and, at some point, this mass will need to be removed for full pathologic evaluation.  Depending on the decision regarding the management of the adrenal mass, this could be done at the same time or  if the adrenal mass  is not going to be surgically managed then we will see the patient as an outpatient in our GYN clinic here at [facility] and plan outpatient laparoscopic removal.  Please contact us if the patient is to be taken to surgery for the adrenal mass either during this hospitalization or at a future date so a combined procedure could be performed.  For questions and scheduling coordination, call [phone]  Thank you,  Florian Buff, MD  Ct Abdomen Pelvis Wo Contrast  Result Date: 02/01/2018 CLINICAL DATA:  Unwitnessed fall. EXAM: CT ABDOMEN AND PELVIS WITHOUT CONTRAST TECHNIQUE: Multidetector CT imaging of the abdomen and pelvis was performed following the standard protocol without IV contrast. COMPARISON:  None. FINDINGS: Lower chest: No acute abnormality. Hepatobiliary: No gallstones are noted. 11 mm low density is noted in right hepatic lobe which may represent cyst but is not definitive on the basis of this study. Pancreas: Unremarkable. No pancreatic ductal dilatation or surrounding inflammatory changes. Spleen: Normal in size without focal abnormality. Adrenals/Urinary Tract: 3.3 cm right adrenal adenoma is noted. 10.3 cm solid left adrenal mass is noted concerning for neoplasm or metastatic disease. Multiple rounded exophytic rounded lesions are seen involving both kidneys which may represent hyperdense cysts, but neoplasm cannot be excluded. This includes 2.1 cm rounded mass arising from lower pole of right kidney. Nonobstructive bilateral nephrolithiasis is noted. No hydronephrosis or renal obstruction is noted. Urinary bladder is unremarkable. Stomach/Bowel: Stomach is within normal limits.  Appendix appears normal. No evidence of bowel wall thickening, distention, or inflammatory changes. Vascular/Lymphatic: Aortic atherosclerosis. No enlarged abdominal or pelvic lymph nodes. Reproductive: Uterus and right ovary unremarkable. 7.5 cm complex septated cystic abnormality appears  to be rising from the left ovary. Pelvic ultrasound is recommended for further evaluation. Other: No abdominal wall hernia or abnormality. No abdominopelvic ascites. Musculoskeletal: Multilevel degenerative disc disease is noted in the lumbar spine. IMPRESSION: 10.3 cm solid left adrenal mass is noted concerning for neoplasm or metastatic disease. Urologic consultation is recommended. 7.5 cm complex septated probable left ovarian cystic abnormality is noted. This is concerning for possible neoplasm. Pelvic ultrasound is recommended for further evaluation. Bilateral nonobstructive nephrolithiasis. 3.3 cm right adrenal adenoma. 11 mm low density seen in right hepatic lobe cyst statistically most likely representing cyst. Electronically Signed   By: Marijo Conception, M.D.   On: 02/01/2018 15:06   US Pelvis Complete  Result Date: 02/01/2018 CLINICAL DATA:  Pelvic mass seen on CT. EXAM: TRANSABDOMINAL AND TRANSVAGINAL ULTRASOUND OF PELVIS TECHNIQUE: Both transabdominal and transvaginal ultrasound examinations of the pelvis were performed. Transabdominal technique was performed for global imaging of the pelvis including uterus, ovaries, adnexal regions, and pelvic cul-de-sac. It was necessary to proceed with endovaginal exam following the transabdominal exam to visualize the left ovary. COMPARISON:  Body CT 02/01/2018 FINDINGS: Uterus Measurements: 6.7 x 2.7 x 4.3 cm. No fibroids or other mass visualized. Endometrium Thickness: 2.4 mm.  No focal abnormality visualized. Right ovary Not clearly identified. 2.1 x 1.8 x 1.9 cm right adnexal simple appearing cyst. Left ovary Not clearly identified. There is a complex cystic structure with internal septations in the left adnexa which measures 8.2 x 6.3 x 7.1 cm. Other findings No abnormal free fluid. IMPRESSION: A normal appearance of the uterus. The ovaries are not definitively identified. Small simple appearing right adnexal cyst. Complex cystic mass versus complicated cyst  in the left adnexa measures 8.2 cm. Electronically Signed   By: Fidela Salisbury M.D.   On: 02/01/2018 18:16   Dg Chest Portable 1 View  Result Date: 02/01/2018 CLINICAL DATA:  Weakness and shortness of Breath EXAM: PORTABLE CHEST 1 VIEW COMPARISON:  06/03/2012 FINDINGS: Cardiac shadow is accentuated by the portable technique. Elevation of the right hemidiaphragm is noted. The lungs are well aerated bilaterally without focal infiltrate or sizable effusion. No acute bony abnormality is seen. Postsurgical changes are again noted. IMPRESSION: No active disease. Electronically Signed   By: Inez Catalina M.D.   On: 02/01/2018 12:56      Past Medical History:  Diagnosis Date  . Depression   . Diabetes mellitus without complication ([place])   . Heart disease, congenital   . High cholesterol   . Hypertension   . Panic attack   . Stroke [facility])     Past Surgical History:  Procedure Laterality Date  . CARDIAC SURGERY  2011   CABG  . WISDOM TOOTH EXTRACTION      Family History  Problem Relation Age of Onset  . Cancer Mother        uterian OR cervical  . Cancer Father        lung  . Leukemia Brother   . Cancer Sister        breast    Social History:  reports that she has been smoking cigarettes.  She has a 50.00 pack-year smoking history. she has never used smokeless tobacco. She reports that she does not drink alcohol or use drugs.  Allergies:  Allergies  Allergen Reactions  . Amoxicillin-Pot Clavulanate Diarrhea    Has patient had a PCN reaction causing immediate rash, facial/tongue/throat swelling, SOB or lightheadedness with hypotension: Unknown Has patient had a PCN reaction causing severe rash involving mucus membranes or skin necrosis:UnknoWN Has patient had a PCN reaction that required hospitalization: Unknown Has patient had a PCN reaction occurring within the last 10 years: Unknown If all of the above answers are "NO", then may proceed with Cephalosporin use.   . Bupropion  Nausea Only  . Nicotine Nausea Only    Used patches only.    Medications:  Scheduled: . acetaminophen-codeine  1 tablet Oral QHS  . ALPRAZolam  0.25 mg Oral BID  . atorvastatin  40 mg Oral q1800  . cholecalciferol  1,000 Units Oral Daily  . diltiazem  60 mg Oral Q8H  . heparin injection (subcutaneous)  5,000 Units Subcutaneous Q8H  . metoprolol tartrate  25 mg Oral TID  . nicotine  14 mg Transdermal Daily  . pantoprazole (PROTONIX) IV  40 mg Intravenous Q24H  . potassium chloride  10 mEq Oral Daily  . prazosin  1 mg Oral BID    ROS  Blood pressure 125/81, pulse 68, temperature 98 F (36.7 C), temperature source Oral, resp. rate (!) 21, height '5\' 3"'  (1.6 m), weight 169 lb 8.5 oz (76.9 kg), SpO2 98 %. Physical Exam  Results for orders placed or performed during the hospital encounter of 02/01/18 (from the past 48 hour(s))  Basic metabolic panel     Status: Abnormal   Collection Time: 02/03/18  4:23 AM  Result Value Ref Range   Sodium 139 135 - 145 mmol/L   Potassium 3.8 3.5 - 5.1 mmol/L   Chloride 116 (H) 101 - 111 mmol/L   CO2 15 (L) 22 - 32 mmol/L   Glucose, Bld 114 (H) 65 - 99 mg/dL   BUN 71 (H) 6 - 20 mg/dL   Creatinine, Ser 2.27 (H) 0.44 - 1.00 mg/dL   Calcium 10.1 8.9 - 10.3 mg/dL   GFR calc non Af Amer 22 (L) >60 mL/min   GFR calc Af Amer 25 (L) >60 mL/min    Comment: (NOTE) The eGFR has been calculated using the CKD EPI equation. This calculation has not been validated in all clinical situations. eGFR's persistently <60 mL/min signify possible Chronic Kidney Disease.    Anion gap 8 5 - 15    Comment: Performed at [facility], [place] [address], [place], [place] [address]  Occult blood card to lab, stool RN will collect     Status: Abnormal   Collection Time: 02/03/18  2:23 PM  Result Value Ref Range   Fecal Occult Bld POSITIVE (A) NEGATIVE    Comment: Performed at [facility], [place] [address], [place], [place] [address]   Basic metabolic panel     Status: Abnormal   Collection Time: 02/04/18  4:19 AM  Result Value Ref Range   Sodium 135 135 - 145 mmol/L   Potassium 3.6 3.5 - 5.1 mmol/L   Chloride 112 (H) 101 - 111 mmol/L   CO2 15 (L) 22 - 32 mmol/L   Glucose, Bld 116 (H) 65 - 99 mg/dL   BUN 60 (H) 6 - 20 mg/dL   Creatinine, Ser 1.91 (H) 0.44 - 1.00 mg/dL   Calcium 9.3 8.9 - 10.3 mg/dL   GFR calc non Af Amer 27 (L) >60 mL/min   GFR calc Af Amer 31 (L) >60 mL/min  Comment: (NOTE) The eGFR has been calculated using the CKD EPI equation. This calculation has not been validated in all clinical situations. eGFR's persistently <60 mL/min signify possible Chronic Kidney Disease.    Anion gap 8 5 - 15    Comment: Performed at [facility], [place] [address] [place], [place] [address]         Florian Buff 02/04/2018

## 2018-02-04 NOTE — Progress Notes (Signed)
New orders received from Dr. Doylene Canard, followed out. Will continue to monitor pt.

## 2018-02-05 ENCOUNTER — Inpatient Hospital Stay (HOSPITAL_COMMUNITY): Payer: Medicare Other | Admitting: Anesthesiology

## 2018-02-05 ENCOUNTER — Encounter (HOSPITAL_COMMUNITY)
Admission: EM | Disposition: A | Discharge status: Home Health | Payer: Self-pay | Source: Home / Self Care | Attending: Cardiovascular Disease

## 2018-02-05 ENCOUNTER — Inpatient Hospital Stay (HOSPITAL_COMMUNITY): Payer: Medicare Other

## 2018-02-05 ENCOUNTER — Encounter (HOSPITAL_COMMUNITY): Payer: Self-pay | Admitting: Certified Registered"

## 2018-02-05 DIAGNOSIS — I340 Nonrheumatic mitral (valve) insufficiency: Secondary | ICD-10-CM

## 2018-02-05 HISTORY — PX: ESOPHAGOGASTRODUODENOSCOPY (EGD) WITH PROPOFOL: SHX5813

## 2018-02-05 LAB — CBC
HCT: 27.4 % — ABNORMAL LOW (ref 36.0–46.0)
Hemoglobin: 8.9 g/dL — ABNORMAL LOW (ref 12.0–15.0)
MCH: 29.5 pg (ref 26.0–34.0)
MCHC: 32.5 g/dL (ref 30.0–36.0)
MCV: 90.7 fL (ref 78.0–100.0)
PLATELETS: 498 10*3/uL — AB (ref 150–400)
RBC: 3.02 MIL/uL — ABNORMAL LOW (ref 3.87–5.11)
RDW: 19 % — AB (ref 11.5–15.5)
WBC: 7.1 10*3/uL (ref 4.0–10.5)

## 2018-02-05 LAB — IRON AND TIBC
IRON: 34 ug/dL (ref 28–170)
SATURATION RATIOS: 12 % (ref 10.4–31.8)
TIBC: 287 ug/dL (ref 250–450)
UIBC: 253 ug/dL

## 2018-02-05 LAB — BASIC METABOLIC PANEL
Anion gap: 9 (ref 5–15)
BUN: 54 mg/dL — AB (ref 6–20)
CO2: 15 mmol/L — ABNORMAL LOW (ref 22–32)
CREATININE: 2.02 mg/dL — AB (ref 0.44–1.00)
Calcium: 9.6 mg/dL (ref 8.9–10.3)
Chloride: 114 mmol/L — ABNORMAL HIGH (ref 101–111)
GFR calc Af Amer: 29 mL/min — ABNORMAL LOW (ref >60)
GFR, EST NON AFRICAN AMERICAN: 25 mL/min — AB (ref >60)
GLUCOSE: 107 mg/dL — AB (ref 65–99)
Potassium: 4.1 mmol/L (ref 3.5–5.1)
SODIUM: 138 mmol/L (ref 135–145)

## 2018-02-05 LAB — ECHOCARDIOGRAM COMPLETE
Height: 63 in
Weight: 2675.2 oz

## 2018-02-05 LAB — FERRITIN: FERRITIN: 83 ng/mL (ref 11–307)

## 2018-02-05 SURGERY — ESOPHAGOGASTRODUODENOSCOPY (EGD) WITH PROPOFOL
Anesthesia: Monitor Anesthesia Care | Laterality: Left

## 2018-02-05 MED ORDER — SODIUM CHLORIDE 0.9 % IV SOLN
INTRAVENOUS | Status: DC
  Administered 2018-02-05: 500 mL via INTRAVENOUS
  Administered 2018-02-05 (×2): via INTRAVENOUS

## 2018-02-05 MED ORDER — MIDAZOLAM HCL 2 MG/2ML IJ SOLN
INTRAMUSCULAR | Status: AC
  Filled 2018-02-05: qty 2

## 2018-02-05 MED ORDER — LIDOCAINE 2% (20 MG/ML) 5 ML SYRINGE
INTRAMUSCULAR | Status: DC | PRN
  Administered 2018-02-05: 40 mg via INTRAVENOUS

## 2018-02-05 MED ORDER — PHENYLEPHRINE 40 MCG/ML (10ML) SYRINGE FOR IV PUSH (FOR BLOOD PRESSURE SUPPORT)
PREFILLED_SYRINGE | INTRAVENOUS | Status: DC | PRN
  Administered 2018-02-05: 200 ug via INTRAVENOUS
  Administered 2018-02-05: 160 ug via INTRAVENOUS
  Administered 2018-02-05 (×3): 120 ug via INTRAVENOUS

## 2018-02-05 MED ORDER — PROPOFOL 10 MG/ML IV BOLUS
INTRAVENOUS | Status: AC
  Filled 2018-02-05: qty 40

## 2018-02-05 MED ORDER — PROPOFOL 10 MG/ML IV BOLUS
INTRAVENOUS | Status: DC | PRN
  Administered 2018-02-05 (×3): 30 mg via INTRAVENOUS

## 2018-02-05 MED ORDER — MIDAZOLAM HCL 2 MG/2ML IJ SOLN
INTRAMUSCULAR | Status: DC | PRN
  Administered 2018-02-05: 0.5 mg via INTRAVENOUS
  Administered 2018-02-05: 1 mg via INTRAVENOUS

## 2018-02-05 SURGICAL SUPPLY — 14 items

## 2018-02-05 NOTE — Brief Op Note (Signed)
02/01/2018 - 02/05/2018  9:20 AM  PATIENT:  Renee Griffin  61 y.o. female  PRE-OPERATIVE DIAGNOSIS:  anemia, FOBT positive stools  POST-OPERATIVE DIAGNOSIS:  gastrointestinal bx  PROCEDURE:  Procedure(s): ESOPHAGOGASTRODUODENOSCOPY (EGD) WITH PROPOFOL (Left)  SURGEON:  Surgeon(s) and Role:    * Nidia Grogan, MD - Primary  Findings/recommendations ------------------------------------ - EGD showed gastritis with gastric erosions, inflammation of the duodenal bulb as well as distal esophagitis. - Biopsies pending - twice a day PPI for now - Full liquid diet - GI will follow  Otis Brace MD, FACP 02/05/2018, 9:21 AM  Contact #  [phone]

## 2018-02-05 NOTE — Progress Notes (Signed)
PT Cancellation Note  Patient Details Name: Renee Griffin MRN: [id number] DOB: October 05, 1956   Cancelled Treatment:    Reason Eval/Treat Not Completed: Patient at procedure or test/unavailable   Claretha Cooper 02/05/2018, 8:38 AM Tresa Endo PT [phone]

## 2018-02-05 NOTE — Anesthesia Preprocedure Evaluation (Addendum)
Anesthesia Evaluation  Patient identified by MRN, date of birth, ID band Patient awake  General Assessment Comment:1. Anemia, FOBT positive stool, hemoglobin dropped from 11.8-10.3  2. Renal impairment(BUN/creatinine/GFR has improved from 18 8/3.13/17 to 60/1.91/31). Acidosis bicarbonate improved from 11-15. Leukocytosis 16.3 on admission has improved to 7.9. Thrombocytosis, platelets improved from 666-425.  3. 11 mm right hepatic cyst, normal LFTs  4. 3.3 cm right adrenal adenoma 10.3 cm solid left adrenal adenoma concerning for neoplasm or metastatic disease. Multiple rounded exophytic lesions in both kidneys. 2.1 cm rounded mass in lower pole of right kidney.  5. 7.5 cm complex septated cystic left ovarian mass Ultrasound showed complex cystic mass versus complicated cyst in the left adnexa measuring 8.2 cm. CA-125 pending.  Reviewed: Allergy & Precautions, NPO status , Patient's Chart, lab work & pertinent test results  Airway Mallampati: II  TM Distance: >3 FB Neck ROM: Full    Dental no notable dental hx. (+) Dental Advisory Given   Pulmonary Current Smoker,    Pulmonary exam normal breath sounds clear to auscultation       Cardiovascular hypertension, + CAD and + CABG  Normal cardiovascular exam+ dysrhythmias Atrial Fibrillation  Rhythm:Regular Rate:Normal  In afib with RVR upon admission  Converted to sinus after fluid resuscitation   Neuro/Psych CVA negative psych ROS   GI/Hepatic negative GI ROS, Neg liver ROS,   Endo/Other  diabetes  Renal/GU negative Renal ROS  negative genitourinary   Musculoskeletal negative musculoskeletal ROS (+)   Abdominal   Peds negative pediatric ROS (+)  Hematology  (+) anemia ,   Anesthesia Other Findings   Reproductive/Obstetrics negative OB ROS                           Anesthesia Physical Anesthesia Plan  ASA: III  Anesthesia Plan:  MAC   Post-op Pain Management:    Induction: Intravenous  PONV Risk Score and Plan:   Airway Management Planned: Nasal Cannula  Additional Equipment:   Intra-op Plan:   Post-operative Plan: Extubation in OR  Informed Consent: I have reviewed the patients History and Physical, chart, labs and discussed the procedure including the risks, benefits and alternatives for the proposed anesthesia with the patient or authorized representative who has indicated his/her understanding and acceptance.   Dental advisory given  Plan Discussed with: CRNA and Surgeon  Anesthesia Plan Comments:         Anesthesia Quick Evaluation

## 2018-02-05 NOTE — Anesthesia Procedure Notes (Signed)
Date/Time: 02/05/2018 8:55 AM Performed by: Cynda Familia, CRNA Pre-anesthesia Checklist: Patient identified, Emergency Drugs available, Suction available, Patient being monitored and Timeout performed Patient Re-evaluated:Patient Re-evaluated prior to induction Oxygen Delivery Method: Nasal cannula Placement Confirmation: positive ETCO2 and breath sounds checked- equal and bilateral Dental Injury: Teeth and Oropharynx as per pre-operative assessment

## 2018-02-05 NOTE — Progress Notes (Signed)
[facility] Gastroenterology Progress Note  Renee Griffin 62 y.o. 09-26-1956  CC:  Anemia, occult blood positive stool, abdominal pain   Subjective: Patient is complaining of generalized abdominal discomfort. She denies any melena or bright blood per rectum. Denies any nausea or vomiting.  ROS : Positive for anxiety. Negative for active chest pain.   Objective: Vital signs in last 24 hours: Vitals:   02/05/18 0513 02/05/18 0826  BP: 119/60 (!) 121/56  Pulse: 83 75  Resp: 18 19  Temp: 97.6 F (36.4 C) 98 F (36.7 C)  SpO2: 96% 97%    Physical Exam:  gen : Alert and 3.  Heart. Rhythm regular. No murmur Abdomen. Generalized discomfort without any particular area of tenderness. Abdomen is soft. Bowel sounds are present. No peritoneal signs Lower extremity. No edema  Psych : Anxious patient.  Lab Results: Recent Labs    02/04/18 0419 02/05/18 0450  NA 135 138  K 3.6 4.1  CL 112* 114*  CO2 15* 15*  GLUCOSE 116* 107*  BUN 60* 54*  CREATININE 1.91* 2.02*  CALCIUM 9.3 9.6   No results for input(s): AST, ALT, ALKPHOS, BILITOT, PROT, ALBUMIN in the last 72 hours. Recent Labs    02/05/18 0450  WBC 7.1  HGB 8.9*  HCT 27.4*  MCV 90.7  PLT 498*   No results for input(s): LABPROT, INR in the last 72 hours.    Assessment/Plan: - Anemia with occult blood positive stool. Hemoglobin down to 8.9. - Elevated kidney function. Possibility chronic kidney disease - Abnormal CT scan showing an adrenal adenoma, kidney mass as well as complex cystic mass in the left ovary  Recommendations ------------------------ - EGD today. Risks benefits and alternative again discussed with patient in the endoscopy holding unit today. Patient verbalized understanding. - Continue PPI. - Further plan based on endoscopic finding.   Otis Brace MD, [place] 02/05/2018, 8:28 AM  Contact #  [phone]

## 2018-02-05 NOTE — Op Note (Signed)
[facility] Patient Name: Renee Griffin Procedure Date: 02/05/2018 MRN: [id number] Attending MD: Otis Brace , MD Date of Birth: September 05, 1956 CSN: [id number] Age: 62 Admit Type: Inpatient Procedure:                Upper GI endoscopy Indications:              Unexplained iron deficiency anemia, Heme positive                            stool Providers:                Otis Brace, MD, Cleda Daub, RN, William Dalton, Technician Referring MD:              Medicines:                Sedation Administered by an Anesthesia Professional Complications:            No immediate complications. Estimated Blood Loss:     Estimated blood loss was minimal. Procedure:                Pre-Anesthesia Assessment:                           - Prior to the procedure, a History and Physical                            was performed, and patient medications and                            allergies were reviewed. The patient's tolerance of                            previous anesthesia was also reviewed. The risks                            and benefits of the procedure and the sedation                            options and risks were discussed with the patient.                            All questions were answered, and informed consent                            was obtained. Prior Anticoagulants: The patient has                            taken no previous anticoagulant or antiplatelet                            agents. ASA Grade Assessment: III - A patient with  severe systemic disease. After reviewing the risks                            and benefits, the patient was deemed in                            satisfactory condition to undergo the procedure.                           After obtaining informed consent, the endoscope was                            passed under direct vision. Throughout the   procedure, the patient's blood pressure, pulse, and                            oxygen saturations were monitored continuously. The                            EG-[id number] ([id number]) scope was introduced through the                            mouth, and advanced to the second part of duodenum.                            The upper GI endoscopy was accomplished without                            difficulty. The patient tolerated the procedure                            well. Scope In: Scope Out: Findings:      LA Grade A (one or more mucosal breaks less than 5 mm, not extending       between tops of 2 mucosal folds) esophagitis with no bleeding was found       at the gastroesophageal junction. Biopsies were taken with a cold       forceps for histology.      Scattered moderate inflammation characterized by erosions, erythema,       friability and linear erosions was found in the entire examined stomach.       Biopsies were taken with a cold forceps for Helicobacter pylori testing.      The cardia and gastric fundus were normal on retroflexion.      Scattered moderate inflammation characterized by congestion (edema),       erosions and erythema was found in the duodenal bulb with mild narrowing       of duodenal sweep. I was able to advance scope without any diffiuclty.       Biopsies were taken with a cold forceps for histology.      The second portion of the duodenum was normal. Impression:               - LA Grade A reflux esophagitis. Biopsied.                           - Gastritis.  Biopsied.                           - Duodenitis. Biopsied.                           - Normal second portion of the duodenum. Moderate Sedation:      Moderate (conscious) sedation was personally administered by an       anesthesia professional. The following parameters were monitored: oxygen       saturation, heart rate, blood pressure, and response to care. Recommendation:           - Return patient to  hospital ward for ongoing care.                           - Full liquid diet.                           - Continue present medications.                           - Await pathology results. Procedure Code(s):        --- Professional ---                           [phone], Esophagogastroduodenoscopy, flexible,                            transoral; with biopsy, single or multiple Diagnosis Code(s):        --- Professional ---                           K21.0, Gastro-esophageal reflux disease with                            esophagitis                           K29.70, Gastritis, unspecified, without bleeding                           K29.80, Duodenitis without bleeding                           D50.9, Iron deficiency anemia, unspecified                           R19.5, Other fecal abnormalities CPT copyright 2016 American Medical Association. All rights reserved. The codes documented in this report are preliminary and upon coder review may  be revised to meet current compliance requirements. Otis Brace, MD Otis Brace, MD 02/05/2018 9:18:47 AM Number of Addenda: 0

## 2018-02-05 NOTE — Progress Notes (Signed)
  Echocardiogram 2D Echocardiogram has been performed.  Lis Savitt T Holland Kotter 02/05/2018, 1:41 PM

## 2018-02-05 NOTE — Anesthesia Postprocedure Evaluation (Signed)
Anesthesia Post Note  Patient: Renee Griffin  Procedure(s) Performed: ESOPHAGOGASTRODUODENOSCOPY (EGD) WITH PROPOFOL (Left )     Patient location during evaluation: PACU Anesthesia Type: MAC Level of consciousness: awake and alert Pain management: pain level controlled Vital Signs Assessment: post-procedure vital signs reviewed and stable Respiratory status: spontaneous breathing, nonlabored ventilation, respiratory function stable and patient connected to nasal cannula oxygen Cardiovascular status: stable and blood pressure returned to baseline Postop Assessment: no apparent nausea or vomiting Anesthetic complications: no    Last Vitals:  Vitals:   02/05/18 0940 02/05/18 0950  BP: (!) 93/53 (!) 99/48  Pulse: 72   Resp: 18   Temp:    SpO2: 98%     Last Pain:  Vitals:   02/05/18 0921  TempSrc: Oral  PainSc:                  Stevin Bielinski S

## 2018-02-05 NOTE — Transfer of Care (Signed)
Immediate Anesthesia Transfer of Care Note  Patient: Renee Griffin  Procedure(s) Performed: ESOPHAGOGASTRODUODENOSCOPY (EGD) WITH PROPOFOL (Left )  Patient Location: PACU and Endoscopy Unit  Anesthesia Type:MAC  Level of Consciousness: awake and alert   Airway & Oxygen Therapy: Patient Spontanous Breathing and Patient connected to nasal cannula oxygen  Post-op Assessment: Report given to RN and Post -op Vital signs reviewed and stable  Post vital signs: Reviewed and stable  Last Vitals:  Vitals:   02/05/18 0826 02/05/18 0921  BP: (!) 121/56 (!) 88/37  Pulse: 75 65  Resp: 19 (!) 22  Temp: 36.7 C 36.6 C  SpO2: 97% 100%    Last Pain:  Vitals:   02/05/18 0921  TempSrc: Oral  PainSc:       Patients Stated Pain Goal: 2 (35/45/62 [id number])  Complications: No apparent anesthesia complications

## 2018-02-05 NOTE — Progress Notes (Signed)
OT Cancellation Note  Patient Details Name: Renee Griffin MRN: [id number] DOB: 27-Jul-1956   Cancelled Treatment:    Reason Eval/Treat Not Completed: Patient at procedure or test/ unavailable  Will check on pt next day Kari Baars, [place]  Payton Mccallum D 02/05/2018, 3:02 PM

## 2018-02-05 NOTE — Progress Notes (Signed)
Ref: Renee Dials, MD   Subjective:  Awake. Decreasing abdominal pain. Endoscopy showed gastric erosions, inflammation of the duodenal bulb as well as distal esophagitis.  Echocardiogram with preserved LV systolic function and moderately dilated LA and RA.  Monitor shows atrial fibrillation with controlled ventricular response. Patient is not a candidate for anticoagulation due to acute GI bleed..   Objective:  Vital Signs in the last 24 hours: Temp:  [97.6 F (36.4 C)-98.4 F (36.9 C)] 98.1 F (36.7 C) (02/11 2040) Pulse Rate:  [60-90] 90 (02/11 2040) Cardiac Rhythm: Atrial fibrillation (02/11 1900) Resp:  [16-22] 18 (02/11 2040) BP: (70-121)/(37-69) 120/69 (02/11 2040) SpO2:  [96 %-100 %] 99 % (02/11 2040) Weight:  [75.8 kg (167 lb 3.2 oz)] 75.8 kg (167 lb 3.2 oz) (02/11 0514)  Physical Exam: BP Readings from Last 1 Encounters:  02/05/18 120/69     Wt Readings from Last 1 Encounters:  02/05/18 75.8 kg (167 lb 3.2 oz)    Weight change: -1.059 kg (-5.3 oz) Body mass index is 29.62 kg/m. HEENT: [place]/AT, Eyes-Brown, PERL, EOMI, Conjunctiva-Pale, Sclera-Non-icteric Neck: No JVD, No bruit, Trachea midline. Lungs:  Clear, Bilateral. Cardiac:  Regular rhythm, normal S1 and S2, no S3. II/VI systolic murmur. Abdomen:  Soft, epigastric and umbilical tenderness improved. BS present. Extremities:  No edema present. No cyanosis. No clubbing. CNS: AxOx3, Cranial nerves grossly intact, moves all 4 extremities.  Skin: Warm and dry.   Intake/Output from previous day: 02/10 0701 - 02/11 0700 In: 1573.5 [P.O.:720; I.V.:853.5] Out: 850 [Urine:850]    Lab Results: BMET    Component Value Date/Time   NA 138 02/05/2018 0450   NA 135 02/04/2018 0419   NA 139 02/03/2018 0423   K 4.1 02/05/2018 0450   K 3.6 02/04/2018 0419   K 3.8 02/03/2018 0423   CL 114 (H) 02/05/2018 0450   CL 112 (H) 02/04/2018 0419   CL 116 (H) 02/03/2018 0423   CO2 15 (L) 02/05/2018 0450   CO2 15 (L)  02/04/2018 0419   CO2 15 (L) 02/03/2018 0423   GLUCOSE 107 (H) 02/05/2018 0450   GLUCOSE 116 (H) 02/04/2018 0419   GLUCOSE 114 (H) 02/03/2018 0423   BUN 54 (H) 02/05/2018 0450   BUN 60 (H) 02/04/2018 0419   BUN 71 (H) 02/03/2018 0423   CREATININE 2.02 (H) 02/05/2018 0450   CREATININE 1.91 (H) 02/04/2018 0419   CREATININE 2.27 (H) 02/03/2018 0423   CALCIUM 9.6 02/05/2018 0450   CALCIUM 9.3 02/04/2018 0419   CALCIUM 10.1 02/03/2018 0423   GFRNONAA 25 (L) 02/05/2018 0450   GFRNONAA 27 (L) 02/04/2018 0419   GFRNONAA 22 (L) 02/03/2018 0423   GFRAA 29 (L) 02/05/2018 0450   GFRAA 31 (L) 02/04/2018 0419   GFRAA 25 (L) 02/03/2018 0423   CBC    Component Value Date/Time   WBC 7.1 02/05/2018 0450   RBC 3.02 (L) 02/05/2018 0450   HGB 8.9 (L) 02/05/2018 0450   HCT 27.4 (L) 02/05/2018 0450   PLT 498 (H) 02/05/2018 0450   MCV 90.7 02/05/2018 0450   MCH 29.5 02/05/2018 0450   MCHC 32.5 02/05/2018 0450   RDW 19.0 (H) 02/05/2018 0450   LYMPHSABS 1.1 02/01/2018 1249   MONOABS 1.0 02/01/2018 1249   EOSABS 0.0 02/01/2018 1249   BASOSABS 0.0 02/01/2018 1249   HEPATIC Function Panel Recent Labs    02/01/18 1249  PROT 7.3   HEMOGLOBIN A1C No components found for: HGA1C,  MPG CARDIAC ENZYMES Lab Results  Component Value Date   AOZHYQM 578 (H) 02/01/2018   BNP No results for input(s): PROBNP in the last 8760 hours. TSH No results for input(s): TSH in the last 8760 hours. CHOLESTEROL No results for input(s): CHOL in the last 8760 hours.  Scheduled Meds: . acetaminophen-codeine  1 tablet Oral QHS  . ALPRAZolam  0.25 mg Oral BID  . atorvastatin  40 mg Oral q1800  . cholecalciferol  1,000 Units Oral Daily  . diltiazem  60 mg Oral Q8H  . metoprolol tartrate  25 mg Oral TID  . nicotine  14 mg Transdermal Daily  . pantoprazole (PROTONIX) IV  40 mg Intravenous Q24H  . potassium chloride  10 mEq Oral Daily  . prazosin  1 mg Oral BID   Continuous Infusions: . dextrose 30 mL/hr at  02/05/18 1131   PRN Meds:.acetaminophen, ondansetron **OR** ondansetron (ZOFRAN) IV, sodium chloride  Assessment/Plan: Dehydration-improved Abdominal pain Persistent atrial fibrillation Large adrenal mass Large left ovarian mass or cyst Acute renal failure on CKD, III Acute GI bleed Anemia of GI blood loss Tobacco use disorder Hypertension  Increase activity as tolerated. Appreciate GI consult.   LOS: 4 days    Renee Dials  MD  02/05/2018, 9:01 PM

## 2018-02-06 ENCOUNTER — Encounter (HOSPITAL_COMMUNITY): Payer: Self-pay | Admitting: Gastroenterology

## 2018-02-06 LAB — URINALYSIS, ROUTINE W REFLEX MICROSCOPIC
BILIRUBIN URINE: NEGATIVE
Glucose, UA: NEGATIVE mg/dL
Hgb urine dipstick: NEGATIVE
KETONES UR: NEGATIVE mg/dL
Nitrite: NEGATIVE
PH: 5 (ref 5.0–8.0)
PROTEIN: NEGATIVE mg/dL
Specific Gravity, Urine: 1.01 (ref 1.005–1.030)

## 2018-02-06 LAB — CBC
HEMATOCRIT: 27.8 % — AB (ref 36.0–46.0)
HEMOGLOBIN: 8.9 g/dL — AB (ref 12.0–15.0)
MCH: 29.3 pg (ref 26.0–34.0)
MCHC: 32 g/dL (ref 30.0–36.0)
MCV: 91.4 fL (ref 78.0–100.0)
PLATELETS: 449 10*3/uL — AB (ref 150–400)
RBC: 3.04 MIL/uL — AB (ref 3.87–5.11)
RDW: 19.2 % — ABNORMAL HIGH (ref 11.5–15.5)
WBC: 8.1 10*3/uL (ref 4.0–10.5)

## 2018-02-06 LAB — CA 125: Cancer Antigen (CA) 125: 21.7 U/mL (ref 0.0–38.1)

## 2018-02-06 LAB — METANEPHRINES, PLASMA
METANEPHRINE FREE: 50 pg/mL (ref 0–62)
NORMETANEPHRINE FREE: 444 pg/mL — AB (ref 0–145)

## 2018-02-06 MED ORDER — PANTOPRAZOLE SODIUM 40 MG IV SOLR
40.0000 mg | Freq: Two times a day (BID) | INTRAVENOUS | Status: DC
  Administered 2018-02-06: 40 mg via INTRAVENOUS
  Filled 2018-02-06: qty 40

## 2018-02-06 MED ORDER — POLYETHYLENE GLYCOL 3350 17 G PO PACK
17.0000 g | PACK | Freq: Every day | ORAL | Status: DC
  Administered 2018-02-06: 17 g via ORAL
  Filled 2018-02-06: qty 1

## 2018-02-06 MED ORDER — PANTOPRAZOLE SODIUM 40 MG PO TBEC
40.0000 mg | DELAYED_RELEASE_TABLET | Freq: Two times a day (BID) | ORAL | Status: DC
  Administered 2018-02-06 – 2018-02-09 (×6): 40 mg via ORAL
  Filled 2018-02-06 (×6): qty 1

## 2018-02-06 MED ORDER — ALPRAZOLAM 0.25 MG PO TABS
0.2500 mg | ORAL_TABLET | Freq: Three times a day (TID) | ORAL | Status: DC
  Administered 2018-02-06 – 2018-02-09 (×8): 0.25 mg via ORAL
  Filled 2018-02-06 (×8): qty 1

## 2018-02-06 NOTE — Progress Notes (Signed)
[facility] Gastroenterology Progress Note  Renee Griffin 62 y.o. 02-Dec-1956  CC:  Anemia, occult blood positive stool, abdominal pain   Subjective: Patient is not able to give appropriate history. Stated that she is "not feeling good". C denied any worsening abdominal pain, nausea or vomiting.  ROS : Not able to obtain   Objective: Vital signs in last 24 hours: Vitals:   02/05/18 2040 02/06/18 0522  BP: 120/69 119/82  Pulse: 90 76  Resp: 18 16  Temp: 98.1 F (36.7 C) 97.9 F (36.6 C)  SpO2: 99% 96%    Physical Exam:  gen : Alert but not answering appropriately to questions. Abdomen. Generalized discomfort without any particular area of tenderness. Abdomen is soft. Bowel sounds are present. No peritoneal signs Lower extremity. No edema  Psych : Anxious patient.  Lab Results: Recent Labs    02/04/18 0419 02/05/18 0450  NA 135 138  K 3.6 4.1  CL 112* 114*  CO2 15* 15*  GLUCOSE 116* 107*  BUN 60* 54*  CREATININE 1.91* 2.02*  CALCIUM 9.3 9.6   No results for input(s): AST, ALT, ALKPHOS, BILITOT, PROT, ALBUMIN in the last 72 hours. Recent Labs    02/05/18 0450  WBC 7.1  HGB 8.9*  HCT 27.4*  MCV 90.7  PLT 498*   No results for input(s): LABPROT, INR in the last 72 hours.    Assessment/Plan: - Anemia with occult blood positive stool. Hemoglobin down to 8.9. EGD yesterday showed multiple gastric erosions, duodenitis and distal esophagitis. Biopsies pending - Elevated kidney function. Possibility chronic kidney disease - Abnormal CT scan showing an adrenal adenoma, kidney mass as well as complex cystic mass in the left ovary - Atrial fibrillation. - Constipation  Recommendations ------------------------ - Continue twice a day PPI for now. - Patient is not providing appropriate history. Discussed with the nursing staff. No evidence of active bleeding.  - Recheck CBC today - Start MiraLAX for constipation. - Advance diet to soft. - Recommend colonoscopy, could  be done as an outpatient, once workup for other lesions such as  complex cystic mass in the left ovary has been completed. - GI will follow   Otis Brace MD, [place] 02/06/2018, 8:10 AM  Contact #  [phone]

## 2018-02-06 NOTE — Progress Notes (Signed)
Ref: Renee Dials, MD   Subjective:  Feeling better but weak. Now on oral Pantoprazole. Metanephrine test result still pending. Hgb appears to be stable. Afebrile.  Objective:  Vital Signs in the last 24 hours: Temp:  [97.9 F (36.6 C)-98.1 F (36.7 C)] 97.9 F (36.6 C) (02/12 1351) Pulse Rate:  [76-101] 101 (02/12 1556) Cardiac Rhythm: Atrial fibrillation (02/12 0700) Resp:  [16-18] 18 (02/12 1351) BP: (97-121)/(41-82) 105/56 (02/12 1556) SpO2:  [96 %-99 %] 97 % (02/12 1351) Weight:  [77.7 kg (171 lb 4.8 oz)] 77.7 kg (171 lb 4.8 oz) (02/12 0522)  Physical Exam: BP Readings from Last 1 Encounters:  02/06/18 (!) 105/56     Wt Readings from Last 1 Encounters:  02/06/18 77.7 kg (171 lb 4.8 oz)    Weight change: 1.859 kg (4 lb 1.6 oz) Body mass index is 30.34 kg/m. HEENT: [place]/AT, Eyes-Brown, PERL, EOMI, Conjunctiva-Pale pink, Sclera-Non-icteric Neck: No JVD, No bruit, Trachea midline. Lungs:  Clear, Bilateral. Cardiac:  Regular rhythm, normal S1 and S2, no S3. II/VI systolic murmur. Abdomen:  Soft, non-tender. BS present. Extremities:  No edema present. No cyanosis. No clubbing. CNS: AxOx3, Cranial nerves grossly intact, moves all 4 extremities.  Skin: Warm and dry.   Intake/Output from previous day: 02/11 0701 - 02/12 0700 In: 1714.5 [P.O.:600; I.V.:1114.5] Out: 700 [Urine:700]    Lab Results: BMET    Component Value Date/Time   NA 138 02/05/2018 0450   NA 135 02/04/2018 0419   NA 139 02/03/2018 0423   K 4.1 02/05/2018 0450   K 3.6 02/04/2018 0419   K 3.8 02/03/2018 0423   CL 114 (H) 02/05/2018 0450   CL 112 (H) 02/04/2018 0419   CL 116 (H) 02/03/2018 0423   CO2 15 (L) 02/05/2018 0450   CO2 15 (L) 02/04/2018 0419   CO2 15 (L) 02/03/2018 0423   GLUCOSE 107 (H) 02/05/2018 0450   GLUCOSE 116 (H) 02/04/2018 0419   GLUCOSE 114 (H) 02/03/2018 0423   BUN 54 (H) 02/05/2018 0450   BUN 60 (H) 02/04/2018 0419   BUN 71 (H) 02/03/2018 0423   CREATININE 2.02 (H)  02/05/2018 0450   CREATININE 1.91 (H) 02/04/2018 0419   CREATININE 2.27 (H) 02/03/2018 0423   CALCIUM 9.6 02/05/2018 0450   CALCIUM 9.3 02/04/2018 0419   CALCIUM 10.1 02/03/2018 0423   GFRNONAA 25 (L) 02/05/2018 0450   GFRNONAA 27 (L) 02/04/2018 0419   GFRNONAA 22 (L) 02/03/2018 0423   GFRAA 29 (L) 02/05/2018 0450   GFRAA 31 (L) 02/04/2018 0419   GFRAA 25 (L) 02/03/2018 0423   CBC    Component Value Date/Time   WBC 8.1 02/06/2018 0832   RBC 3.04 (L) 02/06/2018 0832   HGB 8.9 (L) 02/06/2018 0832   HCT 27.8 (L) 02/06/2018 0832   PLT 449 (H) 02/06/2018 0832   MCV 91.4 02/06/2018 0832   MCH 29.3 02/06/2018 0832   MCHC 32.0 02/06/2018 0832   RDW 19.2 (H) 02/06/2018 0832   LYMPHSABS 1.1 02/01/2018 1249   MONOABS 1.0 02/01/2018 1249   EOSABS 0.0 02/01/2018 1249   BASOSABS 0.0 02/01/2018 1249   HEPATIC Function Panel Recent Labs    02/01/18 1249  PROT 7.3   HEMOGLOBIN A1C No components found for: HGA1C,  MPG CARDIAC ENZYMES Lab Results  Component Value Date   MLYYTKP 546 (H) 02/01/2018   BNP No results for input(s): PROBNP in the last 8760 hours. TSH No results for input(s): TSH in the last 8760 hours. CHOLESTEROL  No results for input(s): CHOL in the last 8760 hours.  Scheduled Meds: . acetaminophen-codeine  1 tablet Oral QHS  . ALPRAZolam  0.25 mg Oral BID  . atorvastatin  40 mg Oral q1800  . cholecalciferol  1,000 Units Oral Daily  . diltiazem  60 mg Oral Q8H  . metoprolol tartrate  25 mg Oral TID  . nicotine  14 mg Transdermal Daily  . pantoprazole  40 mg Oral BID  . polyethylene glycol  17 g Oral Daily  . prazosin  1 mg Oral BID   Continuous Infusions: . dextrose 30 mL/hr at 02/06/18 0520   PRN Meds:.acetaminophen, ondansetron **OR** ondansetron (ZOFRAN) IV, sodium chloride  Assessment/Plan: Dehydration- improved Abdominal pain- resolving Persistent atrial fibrillation Large adrenal mass Large left ovarian mass or cyst Acute renal failure on CKD,  III Acute GI blood loss Acute gastritis, esophagitis and duodenitis Tobacco use disorder H/O Hypertension  Continue medical treatment. Increase activity. Refuses SNF placement.   LOS: 5 days    Renee Dials  MD  02/06/2018, 4:59 PM

## 2018-02-06 NOTE — Care Management Important Message (Signed)
Important Message  Patient Details Important Message  Patient Details  Name: DURENDA PECHACEK MRN: [id number] Date of Birth: Dec 22, 1956   Medicare Important Message Given:  Yes    Kerin Salen 02/06/2018, 11:42 AM Name: TASHONA CALK MRN: [id number] Date of Birth: [birth year]   Medicare Important Message Given:  Yes    Kerin Salen 02/06/2018, 11:40 AMImportant Message  Patient Details  Name: LORILYN LAITINEN MRN: [id number] Date of Birth: [birth year]   Medicare Important Message Given:  Yes    Kerin Salen 02/06/2018, 11:40 AM

## 2018-02-06 NOTE — Progress Notes (Signed)
Medications administered by student RN [id number] with supervision of Clinical Instructor Shemiah Rosch MSN, RN-BC or patient's assigned RN.   

## 2018-02-06 NOTE — Progress Notes (Signed)
Pharmacy IV to PO conversion  The patient is receiving Pantoprazole by the intravenous route.  Based on criteria approved by the Pharmacy and [place], the medication is being converted to the equivalent oral dose form.   No active GI bleeding or impaired absorption - bleeding on admission appears to have resolved and tolerating other oral medications  Not s/p esophagectomy  Documented ability to take oral medications for > 24 hr  Plan to continue treatment for at least 1 day  If you have any questions about this conversion, please contact the Pharmacy Department (ext [phone]).  Thank you.  Reuel Boom, PharmD, BCPS [phone] 02/06/2018, 3:36 PM

## 2018-02-06 NOTE — Evaluation (Signed)
Occupational Therapy Evaluation Patient Details Name: Renee Griffin MRN: [id number] DOB: 03/23/1956 Today's Date: 02/06/2018    History of Present Illness Pt admitted with  Anemia, occult blood positive stool, abdominal pain   Clinical Impression   Pt admitted with abdominal pain. Pt currently with functional limitations due to the deficits listed below (see OT Problem List).  Pt will benefit from skilled OT to increase their safety and independence with ADL and functional mobility for ADL to facilitate discharge to venue listed below.      Follow Up Recommendations  SNF    Equipment Recommendations  None recommended by OT    Recommendations for Other Services       Precautions / Restrictions Precautions Precautions: Fall      Mobility Bed Mobility Overal bed mobility: Needs Assistance Bed Mobility: Supine to Sit;Sit to Supine     Supine to sit: Mod assist Sit to supine: Mod assist      Transfers Overall transfer level: Needs assistance Equipment used: Rolling walker (2 wheeled) Transfers: Sit to/from [facility] Sit to Stand: Mod assist Stand pivot transfers: Mod assist       General transfer comment: VC for safety and sequencing    Balance                                           ADL either performed or assessed with clinical judgement   ADL Overall ADL's : Needs assistance/impaired Eating/Feeding: Set up;Sitting   Grooming: Set up;Sitting   Upper Body Bathing: Minimal assistance;Sitting   Lower Body Bathing: Maximal assistance;Sit to/from stand;Cueing for safety;Cueing for sequencing   Upper Body Dressing : Minimal assistance;Sitting   Lower Body Dressing: Maximal assistance;Sit to/from stand;Cueing for safety;Cueing for sequencing   Toilet Transfer: BSC;Moderate assistance;Cueing for sequencing;Stand-pivot;Cueing for safety   Toileting- Clothing Manipulation and Hygiene: Maximal assistance;Sit to/from  stand;Cueing for safety;Cueing for sequencing               Vision Patient Visual Report: No change from baseline              Pertinent Vitals/Pain Pain Assessment: 0-10 Pain Score: 4  Pain Location: back Pain Descriptors / Indicators: Sore Pain Intervention(s): Limited activity within patient's tolerance     Hand Dominance     Extremity/Trunk Assessment Upper Extremity Assessment Upper Extremity Assessment: Generalized weakness           Communication Communication Communication: No difficulties   Cognition Arousal/Alertness: Awake/alert Behavior During Therapy: WFL for tasks assessed/performed Overall Cognitive Status: Within Functional Limits for tasks assessed                                                Home Living Family/patient expects to be discharged to:: Private residence Living Arrangements: Alone   Type of Home: Apartment Home Access: Stairs to enter Technical brewer of Steps: 1         Bathroom Shower/Tub: Teacher, early years/pre: Standard     Home Equipment: [employer] - quad          Prior Functioning/Environment Level of Independence: Independent with assistive device(s)                 OT Problem List: Decreased strength;Decreased  activity tolerance;Decreased safety awareness;Impaired balance (sitting and/or standing);Decreased knowledge of use of DME or AE      OT Treatment/Interventions: Self-care/ADL training;Patient/family education;DME and/or AE instruction    OT Goals(Current goals can be found in the care plan section) Acute Rehab OT Goals Patient Stated Goal: go to rehab then live with by brother OT Goal Formulation: With patient Time For Goal Achievement: 02/20/18 Potential to Achieve Goals: Good  OT Frequency: Min 2X/week   Barriers to D/C: Decreased caregiver support          Co-evaluation              AM-PAC PT "6 Clicks" Daily Activity     Outcome Measure  Help from another person eating meals?: None Help from another person taking care of personal grooming?: A Little Help from another person toileting, which includes using toliet, bedpan, or urinal?: A Lot Help from another person bathing (including washing, rinsing, drying)?: A Lot Help from another person to put on and taking off regular upper body clothing?: A Little Help from another person to put on and taking off regular lower body clothing?: A Lot 6 Click Score: 16   End of Session Equipment Utilized During Treatment: Rolling walker Nurse Communication: Mobility status  Activity Tolerance: Patient limited by fatigue Patient left: in bed;with call bell/phone within reach;with nursing/sitter in room  OT Visit Diagnosis: Unsteadiness on feet (R26.81)                Time: 2836-6294 OT Time Calculation (min): 22 min Charges:  OT Evaluation $OT Eval Moderate Complexity: 1 Mod G-Codes:     Kari Baars, OT [phone]  Payton Mccallum D 02/06/2018, 11:03 AM

## 2018-02-06 NOTE — Progress Notes (Signed)
PT Cancellation Note  Patient Details Name: Renee Griffin MRN: [id number] DOB: 1956-06-11   Cancelled Treatment:    Reason Eval/Treat Not Completed: Patient declined, no reason specified Patient refuses to participate in PT today, stating " well they'd better think again" after being educated that MDs have ordered PT/want to see how she does with mobility. She is resistant to sitting up at EOB, and states "I'm not going to do anything with you later either". Patient left in bed with all needs met this morning, plan to attempt back as/if schedule allows.   Deniece Ree PT, DPT, CBIS  Supplemental Physical Therapist [facility]   Pager [phone]

## 2018-02-07 LAB — COMPREHENSIVE METABOLIC PANEL
ALBUMIN: 2.6 g/dL — AB (ref 3.5–5.0)
ALT: 23 U/L (ref 14–54)
AST: 17 U/L (ref 15–41)
Alkaline Phosphatase: 114 U/L (ref 38–126)
Anion gap: 10 (ref 5–15)
BILIRUBIN TOTAL: 0.3 mg/dL (ref 0.3–1.2)
BUN: 40 mg/dL — AB (ref 6–20)
CO2: 15 mmol/L — ABNORMAL LOW (ref 22–32)
Calcium: 9.5 mg/dL (ref 8.9–10.3)
Chloride: 113 mmol/L — ABNORMAL HIGH (ref 101–111)
Creatinine, Ser: 1.84 mg/dL — ABNORMAL HIGH (ref 0.44–1.00)
GFR calc Af Amer: 33 mL/min — ABNORMAL LOW (ref >60)
GFR calc non Af Amer: 28 mL/min — ABNORMAL LOW (ref >60)
GLUCOSE: 121 mg/dL — AB (ref 65–99)
POTASSIUM: 4.1 mmol/L (ref 3.5–5.1)
Sodium: 138 mmol/L (ref 135–145)
TOTAL PROTEIN: 5.7 g/dL — AB (ref 6.5–8.1)

## 2018-02-07 LAB — CBC
HEMATOCRIT: 26 % — AB (ref 36.0–46.0)
HEMOGLOBIN: 8.4 g/dL — AB (ref 12.0–15.0)
MCH: 29.6 pg (ref 26.0–34.0)
MCHC: 32.3 g/dL (ref 30.0–36.0)
MCV: 91.5 fL (ref 78.0–100.0)
Platelets: 451 10*3/uL — ABNORMAL HIGH (ref 150–400)
RBC: 2.84 MIL/uL — AB (ref 3.87–5.11)
RDW: 19.4 % — AB (ref 11.5–15.5)
WBC: 9.9 10*3/uL (ref 4.0–10.5)

## 2018-02-07 LAB — GLUCOSE, CAPILLARY: Glucose-Capillary: 142 mg/dL — ABNORMAL HIGH (ref 65–99)

## 2018-02-07 MED ORDER — POLYETHYLENE GLYCOL 3350 17 G PO PACK
17.0000 g | PACK | Freq: Two times a day (BID) | ORAL | Status: DC
  Administered 2018-02-07 – 2018-02-09 (×3): 17 g via ORAL
  Filled 2018-02-07 (×4): qty 1

## 2018-02-07 MED ORDER — PEG 3350-KCL-NA BICARB-NACL 420 G PO SOLR
4000.0000 mL | Freq: Once | ORAL | Status: AC
  Administered 2018-02-07: 4000 mL via ORAL

## 2018-02-07 NOTE — Progress Notes (Signed)
Ref: Dixie Dials, MD   Subjective:  Elevated free metanephrine at 444 pg/ml. No new complaints. Colonoscopy planned for GI bleed, Lower Hgb and atrial fibrillation. Afebrile.  Objective:  Vital Signs in the last 24 hours: Temp:  [97.5 F (36.4 C)-98.2 F (36.8 C)] 98.2 F (36.8 C) (02/13 1443) Pulse Rate:  [72-92] 86 (02/13 1443) Resp:  [16-20] 18 (02/13 1321) BP: (105-132)/(51-95) 105/60 (02/13 1443) SpO2:  [97 %] 97 % (02/13 1321) Weight:  [76.9 kg (169 lb 8 oz)] 76.9 kg (169 lb 8 oz) (02/13 0516)  Physical Exam: BP Readings from Last 1 Encounters:  02/07/18 105/60     Wt Readings from Last 1 Encounters:  02/07/18 76.9 kg (169 lb 8 oz)    Weight change: -0.815 kg (-12.8 oz) Body mass index is 30.03 kg/m. HEENT: [place]/AT, Eyes-Brown, PERL, EOMI, Conjunctiva-Pale, Sclera-Non-icteric Neck: No JVD, No bruit, Trachea midline. Lungs:  Clear, Bilateral. Cardiac:  Regular rhythm, normal S1 and S2, no S3. II/VI systolic murmur. Abdomen:  Soft, non-tender. BS present. Extremities:  No edema present. No cyanosis. No clubbing. CNS: AxOx3, Cranial nerves grossly intact, moves all 4 extremities.  Skin: Warm and dry.   Intake/Output from previous day: 02/12 0701 - 02/13 0700 In: 1155.7 [P.O.:180; I.V.:975.7] Out: 475 [Urine:475]    Lab Results: BMET    Component Value Date/Time   NA 138 02/07/2018 0404   NA 138 02/05/2018 0450   NA 135 02/04/2018 0419   K 4.1 02/07/2018 0404   K 4.1 02/05/2018 0450   K 3.6 02/04/2018 0419   CL 113 (H) 02/07/2018 0404   CL 114 (H) 02/05/2018 0450   CL 112 (H) 02/04/2018 0419   CO2 15 (L) 02/07/2018 0404   CO2 15 (L) 02/05/2018 0450   CO2 15 (L) 02/04/2018 0419   GLUCOSE 121 (H) 02/07/2018 0404   GLUCOSE 107 (H) 02/05/2018 0450   GLUCOSE 116 (H) 02/04/2018 0419   BUN 40 (H) 02/07/2018 0404   BUN 54 (H) 02/05/2018 0450   BUN 60 (H) 02/04/2018 0419   CREATININE 1.84 (H) 02/07/2018 0404   CREATININE 2.02 (H) 02/05/2018 0450    CREATININE 1.91 (H) 02/04/2018 0419   CALCIUM 9.5 02/07/2018 0404   CALCIUM 9.6 02/05/2018 0450   CALCIUM 9.3 02/04/2018 0419   GFRNONAA 28 (L) 02/07/2018 0404   GFRNONAA 25 (L) 02/05/2018 0450   GFRNONAA 27 (L) 02/04/2018 0419   GFRAA 33 (L) 02/07/2018 0404   GFRAA 29 (L) 02/05/2018 0450   GFRAA 31 (L) 02/04/2018 0419   CBC    Component Value Date/Time   WBC 9.9 02/07/2018 0404   RBC 2.84 (L) 02/07/2018 0404   HGB 8.4 (L) 02/07/2018 0404   HCT 26.0 (L) 02/07/2018 0404   PLT 451 (H) 02/07/2018 0404   MCV 91.5 02/07/2018 0404   MCH 29.6 02/07/2018 0404   MCHC 32.3 02/07/2018 0404   RDW 19.4 (H) 02/07/2018 0404   LYMPHSABS 1.1 02/01/2018 1249   MONOABS 1.0 02/01/2018 1249   EOSABS 0.0 02/01/2018 1249   BASOSABS 0.0 02/01/2018 1249   HEPATIC Function Panel Recent Labs    02/01/18 1249 02/07/18 0404  PROT 7.3 5.7*   HEMOGLOBIN A1C No components found for: HGA1C,  MPG CARDIAC ENZYMES Lab Results  Component Value Date   CKTOTAL 735 (H) 02/01/2018   BNP No results for input(s): PROBNP in the last 8760 hours. TSH No results for input(s): TSH in the last 8760 hours. CHOLESTEROL No results for input(s): CHOL in the  last 8760 hours.  Scheduled Meds: . acetaminophen-codeine  1 tablet Oral QHS  . ALPRAZolam  0.25 mg Oral TID  . atorvastatin  40 mg Oral q1800  . cholecalciferol  1,000 Units Oral Daily  . diltiazem  60 mg Oral Q8H  . metoprolol tartrate  25 mg Oral TID  . nicotine  14 mg Transdermal Daily  . pantoprazole  40 mg Oral BID  . polyethylene glycol  17 g Oral BID  . prazosin  1 mg Oral BID   Continuous Infusions: . dextrose 50 mL/hr at 02/07/18 0445   PRN Meds:.acetaminophen, ondansetron **OR** ondansetron (ZOFRAN) IV, sodium chloride  Assessment/Plan: Abdominal pain Acute gastritis, esophagitis and duodenitis  Atrial fibrillation, persistent Large left adrenal mass Large left ovarian mass Tobacco use disorder Acute GI blood loss Acute  gastritis Hypertension CKD, III  Appreciate GI consult. Follow with GI,   LOS: 6 days    Dixie Dials  MD  02/07/2018, 7:35 PM

## 2018-02-07 NOTE — H&P (View-Only) (Signed)
[facility] Gastroenterology Progress Note  Renee Griffin 62 y.o. [birth year]  CC:  Anemia, occult blood positive stool, abdominal pain   Subjective: Patient resting comfortably. Denied any abdominal pain. Discussed with nursing staff. No acute GI issues except for constipation. Last bowel movement 3-4 days ago.  ROS : Positive for anxiety. Negative for shortness of breath and chest pain.   Objective: Vital signs in last 24 hours: Vitals:   02/06/18 2129 02/07/18 0516  BP: (!) 132/92 (!) 132/95  Pulse: 92 72  Resp: 16 20  Temp: 98 F (36.7 C) (!) 97.5 F (36.4 C)  SpO2: 97% 97%    Physical Exam:  gen : Alert but not answering appropriately to questions. Abdomen. Soft, nontender , mild distended,Bowel sounds are present. No peritoneal signs Lower extremity. No edema  Psych : Anxious patient.  Lab Results: Recent Labs    02/05/18 0450 02/07/18 0404  NA 138 138  K 4.1 4.1  CL 114* 113*  CO2 15* 15*  GLUCOSE 107* 121*  BUN 54* 40*  CREATININE 2.02* 1.84*  CALCIUM 9.6 9.5   Recent Labs    02/07/18 0404  AST 17  ALT 23  ALKPHOS 114  BILITOT 0.3  PROT 5.7*  ALBUMIN 2.6*   Recent Labs    02/06/18 0832 02/07/18 0404  WBC 8.1 9.9  HGB 8.9* 8.4*  HCT 27.8* 26.0*  MCV 91.4 91.5  PLT 449* 451*   No results for input(s): LABPROT, INR in the last 72 hours.    Assessment/Plan: - Anemia with occult blood positive stool. Hemoglobin down to 8.4. EGD 02/11 showed multiple gastric erosions, duodenitis and distal esophagitis. Biopsies negative for H. pylori and celiac disease - Elevated kidney function. Possibility chronic kidney disease - Abnormal CT scan showing an adrenal adenoma, kidney mass as well as complex cystic mass in the left ovary - Atrial fibrillation. - Constipation  Recommendations ------------------------ - Continue twice a day PPI for now. - Biopsies negative for H. Pylori and celiac disease - Discuss with admitting physician Dr. Doylene Canard. OB/GYN  is planning for outpatient follow-up for complex ovarian cyst. Given patient atrial fibrillation, she may need anticoagulation in next 1-2 weeks. - Because of anemia and occult positive stool and need for anticoagulation,  it is reasonable to proceed with inpatient colonoscopy. - Plan for colonoscopy tomorrow. Clear liquid diet today. Nothing by mouth past midnight. - I have called patient's brother and discuss need for colonoscopy. Risks benefits and alternative also discussed with patient's brother. He also verbalized understanding for colonoscopy. - Procedure was discussed in detail with the patient. We compared colonoscopy with recently done EGD. Risk benefits and alternatives discussed with the patient. She verbalized understanding  Otis Brace MD, [place] 02/07/2018, 9:16 AM  Contact #  [phone]

## 2018-02-07 NOTE — Progress Notes (Signed)
Patient has only drank half a cup of the bowel prep needed for colonoscopy in AM. Encouraged to drink more, she states "i'll try." Explained the importance of drinking prep to patient and why it's necessary in order to have the colonoscopy. Will continue to encourage patient to drink prep.

## 2018-02-07 NOTE — Evaluation (Addendum)
Physical Therapy Evaluation Patient Details Name: Renee Griffin MRN: [id number] DOB: 03/08/1956 Today's Date: 02/07/2018   History of Present Illness  Pt is a 62 year old female admitted with anemia, occult blood positive stool, abdominal pain  Clinical Impression  Pt admitted with above diagnosis. Pt currently with functional limitations due to the deficits listed below (see PT Problem List).  Pt will benefit from skilled PT to increase their independence and safety with mobility to allow discharge to the venue listed below.  Pt only agreeable to OOB to recliner.  Pt did not require physical assistance today only min/guard for safety.  Plan for colonoscopy tomorrow, 02/08/18.  Will follow in acute pending pt participation.     Follow Up Recommendations Home health PT(if pt agreeable)    Equipment Recommendations  None recommended by PT    Recommendations for Other Services       Precautions / Restrictions Precautions Precautions: Fall Restrictions Weight Bearing Restrictions: No      Mobility  Bed Mobility Overal bed mobility: Needs Assistance Bed Mobility: Supine to Sit     Supine to sit: Min guard     General bed mobility comments: assist for untangling from bed linen however no physical assist required  Transfers Overall transfer level: Needs assistance Equipment used: 1 person hand held assist Transfers: Sit to/from Stand;Stand Pivot Transfers Sit to Stand: Min guard Stand pivot transfers: Min guard       General transfer comment: provided a hand for pt to self steady, pt only wished to get up to recliner at this time  Ambulation/Gait                Stairs            Wheelchair Mobility    Modified Rankin (Stroke Patients Only)       Balance Overall balance assessment: History of Falls(found on the floor prior to admission)                                           Pertinent Vitals/Pain Pain Assessment: 0-10 Pain  Score: 7  Pain Location: abdomen Pain Descriptors / Indicators: Aching Pain Intervention(s): Repositioned;Monitored during session(RN aware)    Home Living Family/patient expects to be discharged to:: Private residence Living Arrangements: Alone   Type of Home: Apartment Home Access: Stairs to enter   Technical brewer of Steps: 1 Home Layout: One level Home Equipment: Cane - quad      Prior Function Level of Independence: Independent with assistive device(s)               Hand Dominance        Extremity/Trunk Assessment        Lower Extremity Assessment Lower Extremity Assessment: Generalized weakness       Communication   Communication: No difficulties  Cognition Arousal/Alertness: Awake/alert Behavior During Therapy: WFL for tasks assessed/performed Overall Cognitive Status: Within Functional Limits for tasks assessed                                        General Comments      Exercises     Assessment/Plan    PT Assessment Patient needs continued PT services  PT Problem List Decreased strength;Decreased mobility;Decreased activity tolerance;Decreased balance;Decreased knowledge of use of  DME       PT Treatment Interventions DME instruction;Therapeutic activities;Gait training;Therapeutic exercise;Functional mobility training;Patient/family education;Balance training    PT Goals (Current goals can be found in the Care Plan section)  Acute Rehab PT Goals PT Goal Formulation: With patient Time For Goal Achievement: 02/21/18 Potential to Achieve Goals: Good    Frequency Min 2X/week   Barriers to discharge        Co-evaluation               AM-PAC PT "6 Clicks" Daily Activity  Outcome Measure Difficulty turning over in bed (including adjusting bedclothes, sheets and blankets)?: A Little Difficulty moving from lying on back to sitting on the side of the bed? : A Little Difficulty sitting down on and standing up  from a chair with arms (e.g., wheelchair, bedside commode, etc,.)?: A Little Help needed moving to and from a bed to chair (including a wheelchair)?: A Little Help needed walking in hospital room?: A Lot Help needed climbing 3-5 steps with a railing? : A Lot 6 Click Score: 16    End of Session   Activity Tolerance: Patient tolerated treatment well Patient left: in chair;with call bell/phone within reach;with chair alarm set Nurse Communication: Mobility status PT Visit Diagnosis: Difficulty in walking, not elsewhere classified (R26.2)    Time: 5749-3552 PT Time Calculation (min) (ACUTE ONLY): 10 min   Charges:   PT Evaluation $PT Eval Low Complexity: 1 Low     PT G CodesCarmelia Bake, PT, DPT 02/07/2018 Pager: [phone]   York Ram E 02/07/2018, 12:59 PM

## 2018-02-07 NOTE — Progress Notes (Addendum)
[facility] Gastroenterology Progress Note  RITHIKA SEEL 62 y.o. [birth year]  CC:  Anemia, occult blood positive stool, abdominal pain   Subjective: Patient resting comfortably. Denied any abdominal pain. Discussed with nursing staff. No acute GI issues except for constipation. Last bowel movement 3-4 days ago.  ROS : Positive for anxiety. Negative for shortness of breath and chest pain.   Objective: Vital signs in last 24 hours: Vitals:   02/06/18 2129 02/07/18 0516  BP: (!) 132/92 (!) 132/95  Pulse: 92 72  Resp: 16 20  Temp: 98 F (36.7 C) (!) 97.5 F (36.4 C)  SpO2: 97% 97%    Physical Exam:  gen : Alert but not answering appropriately to questions. Abdomen. Soft, nontender , mild distended,Bowel sounds are present. No peritoneal signs Lower extremity. No edema  Psych : Anxious patient.  Lab Results: Recent Labs    02/05/18 0450 02/07/18 0404  NA 138 138  K 4.1 4.1  CL 114* 113*  CO2 15* 15*  GLUCOSE 107* 121*  BUN 54* 40*  CREATININE 2.02* 1.84*  CALCIUM 9.6 9.5   Recent Labs    02/07/18 0404  AST 17  ALT 23  ALKPHOS 114  BILITOT 0.3  PROT 5.7*  ALBUMIN 2.6*   Recent Labs    02/06/18 0832 02/07/18 0404  WBC 8.1 9.9  HGB 8.9* 8.4*  HCT 27.8* 26.0*  MCV 91.4 91.5  PLT 449* 451*   No results for input(s): LABPROT, INR in the last 72 hours.    Assessment/Plan: - Anemia with occult blood positive stool. Hemoglobin down to 8.4. EGD 02/11 showed multiple gastric erosions, duodenitis and distal esophagitis. Biopsies negative for H. pylori and celiac disease - Elevated kidney function. Possibility chronic kidney disease - Abnormal CT scan showing an adrenal adenoma, kidney mass as well as complex cystic mass in the left ovary - Atrial fibrillation. - Constipation  Recommendations ------------------------ - Continue twice a day PPI for now. - Biopsies negative for H. Pylori and celiac disease - Discuss with admitting physician Dr. Doylene Canard. OB/GYN  is planning for outpatient follow-up for complex ovarian cyst. Given patient atrial fibrillation, she may need anticoagulation in next 1-2 weeks. - Because of anemia and occult positive stool and need for anticoagulation,  it is reasonable to proceed with inpatient colonoscopy. - Plan for colonoscopy tomorrow. Clear liquid diet today. Nothing by mouth past midnight. - I have called patient's brother and discuss need for colonoscopy. Risks benefits and alternative also discussed with patient's brother. He also verbalized understanding for colonoscopy. - Procedure was discussed in detail with the patient. We compared colonoscopy with recently done EGD. Risk benefits and alternatives discussed with the patient. She verbalized understanding  Otis Brace MD, [place] 02/07/2018, 9:16 AM  Contact #  [phone]

## 2018-02-08 ENCOUNTER — Encounter (HOSPITAL_COMMUNITY): Payer: Self-pay | Admitting: Emergency Medicine

## 2018-02-08 ENCOUNTER — Encounter (HOSPITAL_COMMUNITY)
Admission: EM | Disposition: A | Discharge status: Home Health | Payer: Self-pay | Source: Home / Self Care | Attending: Cardiovascular Disease

## 2018-02-08 ENCOUNTER — Inpatient Hospital Stay (HOSPITAL_COMMUNITY): Payer: Medicare Other | Admitting: Certified Registered Nurse Anesthetist

## 2018-02-08 HISTORY — PX: COLONOSCOPY WITH PROPOFOL: SHX5780

## 2018-02-08 LAB — GLUCOSE, CAPILLARY: GLUCOSE-CAPILLARY: 110 mg/dL — AB (ref 65–99)

## 2018-02-08 SURGERY — COLONOSCOPY WITH PROPOFOL
Anesthesia: Monitor Anesthesia Care

## 2018-02-08 MED ORDER — FLEET ENEMA 7-19 GM/118ML RE ENEM
1.0000 | ENEMA | Freq: Once | RECTAL | Status: AC
  Administered 2018-02-08: 1 via RECTAL
  Filled 2018-02-08: qty 1

## 2018-02-08 MED ORDER — PROPOFOL 10 MG/ML IV BOLUS
INTRAVENOUS | Status: DC | PRN
  Administered 2018-02-08: 50 mg via INTRAVENOUS
  Administered 2018-02-08: 10 mg via INTRAVENOUS

## 2018-02-08 MED ORDER — PROPOFOL 10 MG/ML IV BOLUS
INTRAVENOUS | Status: AC
  Filled 2018-02-08: qty 20

## 2018-02-08 MED ORDER — EPHEDRINE SULFATE-NACL 50-0.9 MG/10ML-% IV SOSY
PREFILLED_SYRINGE | INTRAVENOUS | Status: DC | PRN
  Administered 2018-02-08: 10 mg via INTRAVENOUS
  Administered 2018-02-08: 5 mg via INTRAVENOUS
  Administered 2018-02-08: 10 mg via INTRAVENOUS
  Administered 2018-02-08: 5 mg via INTRAVENOUS
  Administered 2018-02-08 (×2): 10 mg via INTRAVENOUS

## 2018-02-08 MED ORDER — ONDANSETRON HCL 4 MG/2ML IJ SOLN
INTRAMUSCULAR | Status: DC | PRN
  Administered 2018-02-08: 4 mg via INTRAVENOUS

## 2018-02-08 MED ORDER — LIDOCAINE 2% (20 MG/ML) 5 ML SYRINGE
INTRAMUSCULAR | Status: DC | PRN
  Administered 2018-02-08: 100 mg via INTRAVENOUS

## 2018-02-08 MED ORDER — SODIUM CHLORIDE 0.9 % IV SOLN: INTRAVENOUS | Status: DC

## 2018-02-08 MED ORDER — PROPOFOL 500 MG/50ML IV EMUL
INTRAVENOUS | Status: DC | PRN
  Administered 2018-02-08: 100 ug/kg/min via INTRAVENOUS

## 2018-02-08 MED ORDER — PHENYLEPHRINE 40 MCG/ML (10ML) SYRINGE FOR IV PUSH (FOR BLOOD PRESSURE SUPPORT)
PREFILLED_SYRINGE | INTRAVENOUS | Status: DC | PRN
  Administered 2018-02-08 (×3): 80 ug via INTRAVENOUS

## 2018-02-08 MED ORDER — ACETAMINOPHEN-CODEINE #3 300-30 MG PO TABS
1.0000 | ORAL_TABLET | Freq: Once | ORAL | Status: AC
  Administered 2018-02-08: 1 via ORAL
  Filled 2018-02-08: qty 1

## 2018-02-08 MED ORDER — LACTATED RINGERS IV SOLN
INTRAVENOUS | Status: DC
  Administered 2018-02-08: 11:00:00 via INTRAVENOUS

## 2018-02-08 SURGICAL SUPPLY — 21 items

## 2018-02-08 NOTE — Brief Op Note (Addendum)
02/01/2018 - 02/08/2018  12:30 PM  PATIENT:  Renee Griffin  62 y.o. female  PRE-OPERATIVE DIAGNOSIS:  Anemia, FOBT +  POST-OPERATIVE DIAGNOSIS:  poor prep; sigmoid colon polyp removed using hot snare, rectal polyp removed using hot snare  PROCEDURE:  Procedure(s): COLONOSCOPY WITH PROPOFOL (N/A)  SURGEON:  Surgeon(s) and Role:    * Maurita Havener, MD - Primary  Findings ---------- - Patient had poor prep and Procedure was aborted because of poor prep - there were 2 large polyps (1 in proximal sigmoid colon and another in the rectum )which were removed with hot snare. 2 clips were placed on proximal sigmoid colon polypectomy site to close defect  Recommendations ----------------------- - Start full liquid diet and advance as tolerated - Observed overnight - Recommend repeat colonoscopy in 6 months - Okay to start anticoagulation from GI standpoint after 1 week - GI will sign off. Okay to discharge tomorrow if remains asymptomatic from GI standpoint. - Follow-up in GI clinic in 2 months - Findings discussed with patient's brother over the phone.  Otis Brace MD, [place] 02/08/2018, 12:33 PM  Contact #  [phone]  .

## 2018-02-08 NOTE — Progress Notes (Signed)
Patient agreeable to enemas x 2 and flex sig, consent form signed.  Fleets enema administered with large amount of stool returned.

## 2018-02-08 NOTE — Care Management Note (Signed)
Case Management Note  Patient Details  Name: Renee Griffin MRN: [id number] Date of Birth: 1956/03/19  Subjective/Objective:                    Action/Plan: Pt declined [facility]    Expected Discharge Date:  02/04/18               Expected Discharge Plan:  Skilled Nursing Facility  In-House Referral:  Clinical Social Work  Discharge planning Services  CM Consult  Post Acute Care Choice:    Choice offered to:     DME Arranged:    DME Agency:     HH Arranged:    [place] Agency:     Status of Service:  In process, will continue to follow  If discussed at Long Length of Stay Meetings, dates discussed:    Additional CommentsPurcell Mouton, RN 02/08/2018, 9:50 AM

## 2018-02-08 NOTE — Progress Notes (Signed)
Pt did not drink anymore of the bowel prep.

## 2018-02-08 NOTE — Anesthesia Postprocedure Evaluation (Signed)
Anesthesia Post Note  Patient: Renee Griffin  Procedure(s) Performed: COLONOSCOPY WITH PROPOFOL (N/A )     Patient location during evaluation: PACU Anesthesia Type: MAC Level of consciousness: awake and alert Pain management: pain level controlled Vital Signs Assessment: post-procedure vital signs reviewed and stable Respiratory status: spontaneous breathing, nonlabored ventilation, respiratory function stable and patient connected to nasal cannula oxygen Cardiovascular status: stable and blood pressure returned to baseline Postop Assessment: no apparent nausea or vomiting Anesthetic complications: no    Last Vitals:  Vitals:   02/08/18 1240 02/08/18 1246  BP: (!) 92/27 118/60  Pulse: 78 (!) 59  Resp: 19 19  Temp:    SpO2: 97% 99%    Last Pain:  Vitals:   02/08/18 1232  TempSrc: Oral  PainSc:                  [name] DAVID

## 2018-02-08 NOTE — Op Note (Signed)
[facility] Patient Name: Renee Griffin Procedure Date: 02/08/2018 MRN: [id number] Attending MD: Otis Brace , MD Date of Birth: 04/17/1956 CSN: [id number] Age: 62 Admit Type: Inpatient Procedure:                Colonoscopy Indications:              This is the patient's first colonoscopy, Heme                            positive stool Providers:                Otis Brace, MD, Angus Seller, Tinnie Gens,                            Technician, Heide Scales, CRNA Referring MD:              Medicines:                Sedation Administered by an Anesthesia Professional Complications:            No immediate complications. Estimated Blood Loss:     Estimated blood loss was minimal. Procedure:                Pre-Anesthesia Assessment:                           - Prior to the procedure, a History and Physical                            was performed, and patient medications and                            allergies were reviewed. The patient's tolerance of                            previous anesthesia was also reviewed. The risks                            and benefits of the procedure and the sedation                            options and risks were discussed with the patient.                            All questions were answered, and informed consent                            was obtained. Prior Anticoagulants: The patient has                            taken no previous anticoagulant or antiplatelet                            agents. ASA Grade Assessment: III - A patient with  severe systemic disease. After reviewing the risks                            and benefits, the patient was deemed in                            satisfactory condition to undergo the procedure.                           After obtaining informed consent, the colonoscope                            was passed under direct vision. Throughout the                             procedure, the patient's blood pressure, pulse, and                            oxygen saturations were monitored continuously. The                            EC-[id number] ([id number]) scope was introduced through                            the anus with the intention of advancing to the                            cecum. The scope was advanced to the transverse                            colon before the procedure was aborted. Medications                            were given. The colonoscopy was technically                            difficult and complex due to poor bowel prep with                            stool present. The patient tolerated the procedure                            well. The quality of the bowel preparation was                            poor. The rectum was photographed. The quality of                            the bowel preparation was poor. Scope In: 12:00:56 PM Scope Out: 12:21:08 PM Total Procedure Duration: 0 hours 20 minutes 12 seconds  Findings:      The perianal and digital rectal examinations were normal.      A moderate amount of semi-liquid semi-solid solid stool was found in the  entire colon, interfering with visualization. Lavage of the area was       performed, resulting in incomplete clearance with continued poor       visualization. The scope was advanced to the transverse colon before the       procedure was aborted      A 15 mm polyp was found in the proximal sigmoid colon. The polyp was       pedunculated. The polyp was removed with a hot snare. Resection and       retrieval were complete. To close a defect after polypectomy, two       hemostatic clips were successfully placed.      A 12 mm polyp was found in the rectum. The polyp was pedunculated. The       polyp was removed with a hot snare. Resection and retrieval were       complete.      There was poor visualization but No additional abnormalities were found       on  retroflexion. Impression:               - Preparation of the colon was poor. The scope was                            advanced to the transverse colon before the                            procedure was aborted                           - Stool in the entire examined colon.                           - One 15 mm polyp in the proximal sigmoid colon,                            removed with a hot snare. Resected and retrieved.                            Clips were placed.                           - One 12 mm polyp in the rectum, removed with a hot                            snare. Resected and retrieved. Moderate Sedation:      Moderate (conscious) sedation was personally administered by an       anesthesia professional. The following parameters were monitored: oxygen       saturation, heart rate, blood pressure, and response to care. Recommendation:           - Return patient to hospital ward for ongoing care.                           - Soft diet.                           - Continue present medications.                           -  Await pathology results.                           - Repeat colonoscopy in 6 months because the bowel                            preparation was suboptimal.                           - Return to my office in 3 months. Procedure Code(s):        --- Professional ---                           [phone], 52, Colonoscopy, flexible; with removal of                            tumor(s), polyp(s), or other lesion(s) by snare                            technique Diagnosis Code(s):        --- Professional ---                           D12.5, Benign neoplasm of sigmoid colon                           K62.1, Rectal polyp                           R19.5, Other fecal abnormalities CPT copyright 2016 [employer]. All rights reserved. The codes documented in this report are preliminary and upon coder review may  be revised to meet current compliance  requirements. Otis Brace, MD Otis Brace, MD 02/08/2018 12:29:15 PM Number of Addenda: 0

## 2018-02-08 NOTE — Transfer of Care (Signed)
Immediate Anesthesia Transfer of Care Note  Patient: JAWANDA PASSEY  Procedure(s) Performed: Procedure(s): COLONOSCOPY WITH PROPOFOL (N/A)  Patient Location: PACU  Anesthesia Type:MAC  Level of Consciousness: Patient easily awoken, sedated, comfortable, cooperative, following commands, responds to stimulation.   Airway & Oxygen Therapy: Patient spontaneously breathing, ventilating well, oxygen via simple oxygen mask.  Post-op Assessment: Report given to PACU RN, vital signs reviewed and stable, moving all extremities.   Post vital signs: Reviewed and stable.  Complications: No apparent anesthesia complications Last Vitals:  Vitals:   02/08/18 1056 02/08/18 1232  BP: 129/72 (!) 114/92  Pulse: 78 74  Resp: 17 18  Temp: 36.9 C   SpO2: 96% 100%    Last Pain:  Vitals:   02/08/18 1056  TempSrc: Oral  PainSc:       Patients Stated Pain Goal: 2 (03/47/42 5956)  Complications: No apparent anesthesia complications

## 2018-02-08 NOTE — Progress Notes (Signed)
Pt gone for procedure- will check back on pt later or next day.  [place], [place]

## 2018-02-08 NOTE — Progress Notes (Signed)
CSW consulted for SNF placement. Per chart review, PT recommended HHPT. RNCM aware. CSW signing off, no other needs identified at this time.  Abundio Miu, [place] Social Worker [facility] Cell#: [phone]

## 2018-02-08 NOTE — Progress Notes (Signed)
Ref: Dixie Dials, MD   Subjective:  Feeling better. Had inadequate prep with partial colonoscopy and polyp removal. Had back pain improving with analgesic.  Objective:  Vital Signs in the last 24 hours: Temp:  [97.6 F (36.4 C)-98.5 F (36.9 C)] 97.9 F (36.6 C) (02/14 1232) Pulse Rate:  [59-80] 80 (02/14 1436) Resp:  [16-19] 19 (02/14 1246) BP: (92-131)/(27-92) 101/68 (02/14 1436) SpO2:  [96 %-100 %] 99 % (02/14 1246) Weight:  [76.7 kg (169 lb)-76.9 kg (169 lb 8 oz)] 76.7 kg (169 lb) (02/14 1056)  Physical Exam: BP Readings from Last 1 Encounters:  02/08/18 101/68     Wt Readings from Last 1 Encounters:  02/08/18 76.7 kg (169 lb)    Weight change: 0 kg (0 lb) Body mass index is 29.94 kg/m. HEENT: [place]/AT, Eyes-Brown, PERL, EOMI, Conjunctiva-Pale, Sclera-Non-icteric Neck: No JVD, No bruit, Trachea midline. Lungs:  Clear, Bilateral. Cardiac:  Regular rhythm, normal S1 and S2, no S3. II/VI systolic murmur. Abdomen:  Soft, non-tender. BS present. Extremities:  No edema present. No cyanosis. No clubbing. CNS: AxOx3, Cranial nerves grossly intact, moves all 4 extremities.  Skin: Warm and dry.   Intake/Output from previous day: 02/13 0701 - 02/14 0700 In: 1560 [P.O.:360; I.V.:1200] Out: 300 [Urine:300]    Lab Results: BMET    Component Value Date/Time   NA 138 02/07/2018 0404   NA 138 02/05/2018 0450   NA 135 02/04/2018 0419   K 4.1 02/07/2018 0404   K 4.1 02/05/2018 0450   K 3.6 02/04/2018 0419   CL 113 (H) 02/07/2018 0404   CL 114 (H) 02/05/2018 0450   CL 112 (H) 02/04/2018 0419   CO2 15 (L) 02/07/2018 0404   CO2 15 (L) 02/05/2018 0450   CO2 15 (L) 02/04/2018 0419   GLUCOSE 121 (H) 02/07/2018 0404   GLUCOSE 107 (H) 02/05/2018 0450   GLUCOSE 116 (H) 02/04/2018 0419   BUN 40 (H) 02/07/2018 0404   BUN 54 (H) 02/05/2018 0450   BUN 60 (H) 02/04/2018 0419   CREATININE 1.84 (H) 02/07/2018 0404   CREATININE 2.02 (H) 02/05/2018 0450   CREATININE 1.91 (H)  02/04/2018 0419   CALCIUM 9.5 02/07/2018 0404   CALCIUM 9.6 02/05/2018 0450   CALCIUM 9.3 02/04/2018 0419   GFRNONAA 28 (L) 02/07/2018 0404   GFRNONAA 25 (L) 02/05/2018 0450   GFRNONAA 27 (L) 02/04/2018 0419   GFRAA 33 (L) 02/07/2018 0404   GFRAA 29 (L) 02/05/2018 0450   GFRAA 31 (L) 02/04/2018 0419   CBC    Component Value Date/Time   WBC 9.9 02/07/2018 0404   RBC 2.84 (L) 02/07/2018 0404   HGB 8.4 (L) 02/07/2018 0404   HCT 26.0 (L) 02/07/2018 0404   PLT 451 (H) 02/07/2018 0404   MCV 91.5 02/07/2018 0404   MCH 29.6 02/07/2018 0404   MCHC 32.3 02/07/2018 0404   RDW 19.4 (H) 02/07/2018 0404   LYMPHSABS 1.1 02/01/2018 1249   MONOABS 1.0 02/01/2018 1249   EOSABS 0.0 02/01/2018 1249   BASOSABS 0.0 02/01/2018 1249   HEPATIC Function Panel Recent Labs    02/01/18 1249 02/07/18 0404  PROT 7.3 5.7*   HEMOGLOBIN A1C No components found for: HGA1C,  MPG CARDIAC ENZYMES Lab Results  Component Value Date   CKTOTAL 735 (H) 02/01/2018   BNP No results for input(s): PROBNP in the last 8760 hours. TSH No results for input(s): TSH in the last 8760 hours. CHOLESTEROL No results for input(s): CHOL in the last 8760 hours.  Scheduled Meds: . acetaminophen-codeine  1 tablet Oral QHS  . ALPRAZolam  0.25 mg Oral TID  . atorvastatin  40 mg Oral q1800  . cholecalciferol  1,000 Units Oral Daily  . diltiazem  60 mg Oral Q8H  . metoprolol tartrate  25 mg Oral TID  . nicotine  14 mg Transdermal Daily  . pantoprazole  40 mg Oral BID  . polyethylene glycol  17 g Oral BID  . prazosin  1 mg Oral BID   Continuous Infusions: . dextrose 50 mL/hr at 02/08/18 1929   PRN Meds:.acetaminophen, ondansetron **OR** ondansetron (ZOFRAN) IV, sodium chloride  Assessment/Plan: Abdominal pain Acute gastritis, esophagitis and duodenitis Atrial fibrillation, persistent Large left adrenal mass Large left ovarian mass/cyst Tobacco use disorder Acute GI bleeding Anemia of blood  loss Hypertension CKD, III  Continue medical treatment. Home in AM if stable.   LOS: 7 days    Dixie Dials  MD  02/08/2018, 8:03 PM

## 2018-02-08 NOTE — Plan of Care (Signed)
Patient without complaint this shift other than her chronic back pain for which she received one additional dose of Tylenol #3 with improvement.  No complaints after flex sig/colonoscopy, tolerating diet without issue.

## 2018-02-08 NOTE — Interval H&P Note (Signed)
History and Physical Interval Note:  02/08/2018 11:41 AM  Renee Griffin  has presented today for surgery, with the diagnosis of Anemia, occult blood positive stool.  The various methods of treatment have been discussed with the patient and family. After consideration of risks, benefits and other options for treatment, the patient has consented to  Procedure(s): COLONOSCOPY WITH PROPOFOL (N/A) as a surgical intervention .  The patient's history has been reviewed, patient examined, no change in status, stable for surgery.  I have reviewed the patient's chart and labs.  Questions were answered to the patient's satisfaction.     [name]

## 2018-02-08 NOTE — Progress Notes (Signed)
Spoke with pt concerning HH, pt declined. Will need discharge plans for today please.

## 2018-02-08 NOTE — Anesthesia Procedure Notes (Signed)
Procedure Name: MAC Date/Time: 02/08/2018 12:00 PM Performed by: Deliah Boston, CRNA Pre-anesthesia Checklist: Patient identified, Emergency Drugs available, Suction available, Patient being monitored and Timeout performed Patient Re-evaluated:Patient Re-evaluated prior to induction Oxygen Delivery Method: Simple face mask Placement Confirmation: positive ETCO2,  CO2 detector and breath sounds checked- equal and bilateral

## 2018-02-08 NOTE — Anesthesia Preprocedure Evaluation (Signed)
Anesthesia Evaluation  Patient identified by MRN, date of birth, ID band Patient awake    Reviewed: Allergy & Precautions, NPO status , Patient's Chart, lab work & pertinent test results  Airway Mallampati: I  TM Distance: >3 FB Neck ROM: Full    Dental   Pulmonary Current Smoker,    Pulmonary exam normal        Cardiovascular hypertension, Pt. on medications Normal cardiovascular exam+ dysrhythmias Atrial Fibrillation      Neuro/Psych Anxiety Depression CVA    GI/Hepatic   Endo/Other  diabetes, Type 2  Renal/GU      Musculoskeletal   Abdominal   Peds  Hematology   Anesthesia Other Findings   Reproductive/Obstetrics                             Anesthesia Physical Anesthesia Plan  ASA: III  Anesthesia Plan: MAC   Post-op Pain Management:    Induction: Intravenous  PONV Risk Score and Plan: 1 and Ondansetron and Treatment may vary due to age or medical condition  Airway Management Planned: Simple Face Mask  Additional Equipment:   Intra-op Plan:   Post-operative Plan:   Informed Consent: I have reviewed the patients History and Physical, chart, labs and discussed the procedure including the risks, benefits and alternatives for the proposed anesthesia with the patient or authorized representative who has indicated his/her understanding and acceptance.     Plan Discussed with: CRNA and Surgeon  Anesthesia Plan Comments:         Anesthesia Quick Evaluation

## 2018-02-09 ENCOUNTER — Encounter (HOSPITAL_COMMUNITY): Payer: Self-pay | Admitting: Gastroenterology

## 2018-02-09 LAB — CBC
HCT: 26.6 % — ABNORMAL LOW (ref 36.0–46.0)
HEMOGLOBIN: 8.1 g/dL — AB (ref 12.0–15.0)
MCH: 28.3 pg (ref 26.0–34.0)
MCHC: 30.5 g/dL (ref 30.0–36.0)
MCV: 93 fL (ref 78.0–100.0)
PLATELETS: 364 10*3/uL (ref 150–400)
RBC: 2.86 MIL/uL — AB (ref 3.87–5.11)
RDW: 19.6 % — ABNORMAL HIGH (ref 11.5–15.5)
WBC: 7.6 10*3/uL (ref 4.0–10.5)

## 2018-02-09 LAB — BASIC METABOLIC PANEL
Anion gap: 8 (ref 5–15)
BUN: 27 mg/dL — AB (ref 6–20)
CHLORIDE: 114 mmol/L — AB (ref 101–111)
CO2: 18 mmol/L — ABNORMAL LOW (ref 22–32)
Calcium: 9 mg/dL (ref 8.9–10.3)
Creatinine, Ser: 1.81 mg/dL — ABNORMAL HIGH (ref 0.44–1.00)
GFR calc Af Amer: 33 mL/min — ABNORMAL LOW (ref >60)
GFR, EST NON AFRICAN AMERICAN: 29 mL/min — AB (ref >60)
Glucose, Bld: 111 mg/dL — ABNORMAL HIGH (ref 65–99)
POTASSIUM: 3.9 mmol/L (ref 3.5–5.1)
SODIUM: 140 mmol/L (ref 135–145)

## 2018-02-09 MED ORDER — PANTOPRAZOLE SODIUM 40 MG PO TBEC: 40.0000 mg | DELAYED_RELEASE_TABLET | Freq: Two times a day (BID) | ORAL | 6 refills | Status: DC

## 2018-02-09 MED ORDER — PRAZOSIN HCL 1 MG PO CAPS: 1.0000 mg | ORAL_CAPSULE | Freq: Two times a day (BID) | ORAL | 3 refills | Status: DC

## 2018-02-09 MED ORDER — DILTIAZEM HCL 90 MG PO TABS: 90.0000 mg | ORAL_TABLET | Freq: Two times a day (BID) | ORAL | 3 refills | Status: DC

## 2018-02-09 MED ORDER — NICOTINE 14 MG/24HR TD PT24: 14.0000 mg | MEDICATED_PATCH | Freq: Every day | TRANSDERMAL | 0 refills | Status: DC

## 2018-02-09 NOTE — Progress Notes (Signed)
Physical Therapy Treatment Patient Details Name: Renee Griffin MRN: [id number] DOB: 1956/06/25 Today's Date: 02/09/2018    History of Present Illness Pt is a 62 year old female admitted with anemia, occult blood positive stool, abdominal pain    PT Comments    Pt assisted to [facility] and then ambulated in hallway with RW.  Pt requesting RW at home to assist with mobility since she feels weaker from remaining in bed.  Also recommending HHPT, if pt is agreeable.     Follow Up Recommendations  Home health PT(if pt agreeable)     Equipment Recommendations  Rolling walker with 5" wheels    Recommendations for Other Services       Precautions / Restrictions Precautions Precautions: Fall Restrictions Weight Bearing Restrictions: No    Mobility  Bed Mobility Overal bed mobility: Needs Assistance Bed Mobility: Supine to Sit     Supine to sit: Supervision     General bed mobility comments: supervision for safety however no physical assist required, RN into room for back to supine  Transfers Overall transfer level: Needs assistance Equipment used: None Transfers: Sit to/from Stand;Stand Pivot Transfers Sit to Stand: Supervision Stand pivot transfers: Supervision       General transfer comment: supervision for safety, no unsteadiness observed  Ambulation/Gait Ambulation/Gait assistance: Min guard Ambulation Distance (Feet): 60 Feet Assistive device: Rolling walker (2 wheeled) Gait Pattern/deviations: Step-through pattern;Decreased stride length     General Gait Details: cues to remain within RW, pt reports weak legs limiting distance, min/guard for safety   Stairs            Wheelchair Mobility    Modified Rankin (Stroke Patients Only)       Balance                                            Cognition Arousal/Alertness: Awake/alert Behavior During Therapy: WFL for tasks assessed/performed Overall Cognitive Status: Within Functional  Limits for tasks assessed                                        Exercises      General Comments        Pertinent Vitals/Pain Pain Assessment: No/denies pain    Home Living                      Prior Function            PT Goals (current goals can now be found in the care plan section) Progress towards PT goals: Progressing toward goals    Frequency    Min 2X/week      PT Plan Current plan remains appropriate    Co-evaluation              AM-PAC PT "6 Clicks" Daily Activity  Outcome Measure  Difficulty turning over in bed (including adjusting bedclothes, sheets and blankets)?: None Difficulty moving from lying on back to sitting on the side of the bed? : None Difficulty sitting down on and standing up from a chair with arms (e.g., wheelchair, bedside commode, etc,.)?: None Help needed moving to and from a bed to chair (including a wheelchair)?: A Little Help needed walking in hospital room?: A Little Help needed climbing 3-5 steps with a  railing? : A Little 6 Click Score: 21    End of Session   Activity Tolerance: Patient tolerated treatment well Patient left: in bed;with nursing/sitter in room;with call bell/phone within reach Nurse Communication: Mobility status PT Visit Diagnosis: Difficulty in walking, not elsewhere classified (R26.2)     Time: 5176-1607 PT Time Calculation (min) (ACUTE ONLY): 12 min  Charges:  $Gait Training: 8-22 mins                    G Codes:       Carmelia Bake, PT, DPT 02/09/2018 Pager: [phone]  York Ram E 02/09/2018, 11:55 AM

## 2018-02-09 NOTE — Progress Notes (Signed)
Occupational Therapy Treatment Patient Details Name: Renee Griffin MRN: [id number] DOB: 05-09-1956 Today's Date: 02/09/2018    History of present illness Pt is a 62 year old female admitted with anemia, occult blood positive stool, abdominal pain   OT comments  Pt much improved this day.  Follow Up Recommendations  No OT follow up    Equipment Recommendations  3 in 1 bedside commode    Recommendations for Other Services      Precautions / Restrictions Precautions Precautions: Fall Restrictions Weight Bearing Restrictions: No       Mobility Bed Mobility Overal bed mobility: Needs Assistance Bed Mobility: Supine to Sit;Sit to Supine     Supine to sit: Supervision Sit to supine: Supervision   General bed mobility comments: supervision for safety however no physical assist required, RN into room for back to supine  Transfers Overall transfer level: Needs assistance Equipment used: None Transfers: Sit to/from Stand;Stand Pivot Transfers Sit to Stand: Supervision Stand pivot transfers: Supervision       General transfer comment: supervision for safety, no unsteadiness observed    Balance Overall balance assessment: History of Falls(found on the floor prior to admission)                                         ADL either performed or assessed with clinical judgement   ADL Overall ADL's : Needs assistance/impaired         Upper Body Bathing: Set up;Sitting   Lower Body Bathing: Supervison/ safety;Sit to/from stand;Cueing for safety;Cueing for sequencing   Upper Body Dressing : Set up;Sitting   Lower Body Dressing: Set up;Cueing for safety;Cueing for sequencing;Sit to/from stand   Toilet Transfer: Set up;BSC;Stand-pivot   Toileting- Clothing Manipulation and Hygiene: Set up;Sit to/from stand         General ADL Comments: pt much improved. Pts brother will A her as needed.     Vision Patient Visual Report: No change from  baseline            Cognition Arousal/Alertness: Awake/alert Behavior During Therapy: WFL for tasks assessed/performed Overall Cognitive Status: Within Functional Limits for tasks assessed                                                     Pertinent Vitals/ Pain       Pain Assessment: No/denies pain         Frequency  Min 2X/week        Progress Toward Goals  OT Goals(current goals can now be found in the care plan section)  Progress towards OT goals: Progressing toward goals     Plan Discharge plan remains appropriate;Discharge plan needs to be updated       AM-PAC PT "6 Clicks" Daily Activity     Outcome Measure   Help from another person eating meals?: None Help from another person taking care of personal grooming?: A Little Help from another person toileting, which includes using toliet, bedpan, or urinal?: A Little Help from another person bathing (including washing, rinsing, drying)?: A Little Help from another person to put on and taking off regular upper body clothing?: A Little Help from another person to put on and taking off regular lower body clothing?:  A Little 6 Click Score: 19    End of Session Equipment Utilized During Treatment: Rolling walker  OT Visit Diagnosis: Unsteadiness on feet (R26.81)   Activity Tolerance Patient tolerated treatment well   Patient Left in bed;with call bell/phone within reach;with nursing/sitter in room;with bed alarm set   Nurse Communication Mobility status        Time: 9211-9417 OT Time Calculation (min): 14 min  Charges: OT General Charges $OT Visit: 1 Visit OT Treatments $Self Care/Home Management : 8-22 mins  [place], [place]   Betsy Pries 02/09/2018, 12:29 PM

## 2018-02-09 NOTE — Discharge Summary (Signed)
Physician Discharge Summary  Patient ID: Renee Griffin FILE MRN: [id number] DOB/AGE: 02/27/1956 62 y.o.  Admit date: 02/01/2018 Discharge date: 02/09/2018  Admission Diagnoses: Dehydration Acute renal failure from above Abdominal pain Hematuria Nonobstructing urinary stones Large left adrenal mass Large left ovarian mass Hypertension Tobacco use disorder  Discharge Diagnoses:  Principal problem: Acute gastritis Active Problems: Acute esophagitis Acute duodenitis Dehydration, improved Abdominal pain Ovarian mass, left Persistent atrial fibrillation, CHA2DS2VASc score of 5 Large left adrenal mass Acute GI bleeding Anemia of blood loss Hypertension Unwitnessed fall DM, Type II, diet controlled. CKD 3  Discharged Condition: fair  Hospital Course: 62 year old female had loss of appetite for 2 weeks associated with nausea with poor oral intake times 1 week and diarrhea times 3 days. Patient had generalized weakness and had been on the floor for 2 days. Her CT scan of the abdomen and pelvis showed a large left adrenal mass and large left ovarian mass. Her BUN/creatinine were elevated suggesting dehydration.  She responded to IV fluids.  She also had atrial fibrillation with RVR. It responded to diltiazem and metoprolol.  She had urology, GYN and gastroenterology consults. Her adrenal mass will be followed by urology and had a GYN mass/cyst will be followed by GYN physician. For acute GI bleed she underwent upper endoscopy and colonoscopy. Her upper endoscopy showed acute gastritis esophagitis and duodenitis. Her colonoscopy exam was incomplete and will be repeated in 6 months per gastroenterologist.  Her renal function improved with IV fluids.  Alpha blocking medication was given for elevated metanephrine levels along with a beta-blocker and calcium channel blocker medications. Aspirin, Plavix, warfarin or DTI medications will be started if remains stable for 1 week. She was discharged  home in stable condition with follow-up by me in 1 week and by GI physician in 2 months and by urology and GYN physicians as arranged on outpatient basis.  Consults: GI, urology and gynecology  Significant Diagnostic Studies: labs: WBC count was 16.3 thousand hemoglobin was 11.8 and platelets count was markedly elevated at 666,000.  Post hydration hemoglobin was down to 8.9 and on day of discharge it was down to 8.1. Her iron level was adequate.  Sodium potassium were normal.  Chloride was slightly high BUN was 88 creatinine was 3.13.  Creatinine was down to 1.81 on day of discharge.  Iron level was 34 and iron binding capacity was 287 mcg. Ca 125 was normal. Total CK was elevated at 735 suggesting muscle damage post fall AM. cortisol was 13.2, normal.  Plasma metanephrine showed normal free metanephrine and 444 mcg normetanephrine.  Treatments: cardiac meds: metoprolol, diltiazem and pantoprazole.  Discharge Exam: Blood pressure 132/83, pulse 87, temperature 98.6 F (37 C), temperature source Oral, resp. rate 16, height '5\' 3"'  (1.6 m), weight 78.1 kg (172 lb 2.9 oz), SpO2 98 %. General appearance: alert, cooperative and appears stated age. Head: Normocephalic, atraumatic. Eyes: Brown eyes, pale conjunctiva, corneas clear. PERRL, EOM's intact.  Neck: No adenopathy, no carotid bruit, no JVD, supple, symmetrical, trachea midline and thyroid not enlarged. Resp: Clear to auscultation bilaterally. Cardio: Regular rate and rhythm, S1, S2 normal, II/VI systolic murmur, no click, rub or gallop. GI: Soft, non-tender; bowel sounds normal; no organomegaly. Extremities: No edema, cyanosis or clubbing. Skin: Warm and dry.  Neurologic: Alert and oriented X 3, normal strength and tone. Normal coordination and slow gait with cane use.  Disposition: 01-Home or Self Care   Allergies as of 02/09/2018      Reactions   Amoxicillin-pot  Clavulanate Diarrhea   Has patient had a PCN reaction causing immediate  rash, facial/tongue/throat swelling, SOB or lightheadedness with hypotension: Unknown Has patient had a PCN reaction causing severe rash involving mucus membranes or skin necrosis:UnknoWN Has patient had a PCN reaction that required hospitalization: Unknown Has patient had a PCN reaction occurring within the last 10 years: Unknown If all of the above answers are "NO", then may proceed with Cephalosporin use.   Bupropion Nausea Only   Nicotine Nausea Only   Used patches only.      Medication List    STOP taking these medications   amLODipine 5 MG tablet Commonly known as:  NORVASC   methocarbamol 500 MG tablet Commonly known as:  ROBAXIN     TAKE these medications   ACCU-CHEK AVIVA PLUS test strip Generic drug:  glucose blood See admin instructions.   ACCU-CHEK AVIVA PLUS w/Device Kit See admin instructions.   ACCU-CHEK SOFTCLIX LANCETS lancets See admin instructions.   acetaminophen-codeine 300-30 MG tablet Commonly known as:  TYLENOL #3 Take 1-2 tablets by mouth daily as needed for moderate pain.   atorvastatin 40 MG tablet Commonly known as:  LIPITOR TAKE 1 TABLET (40 MG TOTAL) BY MOUTH DAILY.   diltiazem 90 MG tablet Commonly known as:  CARDIZEM Take 1 tablet (90 mg total) by mouth 2 (two) times daily.   metoprolol tartrate 25 MG tablet Commonly known as:  LOPRESSOR Take 1 tablet (25 mg total) by mouth 2 (two) times daily.   multivitamin with minerals Tabs tablet Take 1 tablet by mouth daily.   nicotine 14 mg/24hr patch Commonly known as:  NICODERM CQ - dosed in mg/24 hours Place 1 patch (14 mg total) onto the skin daily. Start taking on:  02/10/2018   pantoprazole 40 MG tablet Commonly known as:  PROTONIX Take 1 tablet (40 mg total) by mouth 2 (two) times daily.   prazosin 1 MG capsule Commonly known as:  MINIPRESS Take 1 capsule (1 mg total) by mouth 2 (two) times daily.   vitamin C 500 MG tablet Commonly known as:  ASCORBIC ACID Take by mouth.    Vitamin D-3 1000 units Caps Take 1,000 Units by mouth daily.      Follow-up Information    Brahmbhatt, Parag, MD. Schedule an appointment as soon as possible for a visit in 2 month(s).   Specialty:  Gastroenterology Contact information: [place] [address] [phone]        Dixie Dials, MD. Schedule an appointment as soon as possible for a visit in 1 week(s).   Specialty:  Cardiology Contact information: [place] [address] [phone]           Signed: [name] 02/09/2018, 2:48 PM

## 2018-02-09 NOTE — Progress Notes (Signed)
A call to Dr. Doylene Canard was made to ask for discharge orders.Pt is ready for discharge and her brother is coming to pick her up.

## 2018-02-09 NOTE — Progress Notes (Signed)
Patient given discharge, follow up, and medication instructions, verbalized understanding, IV removed, personal belongings with patient, family to transport home  

## 2018-02-23 ENCOUNTER — Emergency Department (HOSPITAL_COMMUNITY)
Admission: EM | Admit: 2018-02-23 | Discharge: 2018-02-23 | Disposition: A | Discharge status: Home / Self Care | Payer: Medicare Other | Attending: Emergency Medicine | Admitting: Emergency Medicine

## 2018-02-23 ENCOUNTER — Encounter (HOSPITAL_COMMUNITY): Payer: Self-pay

## 2018-02-23 ENCOUNTER — Other Ambulatory Visit: Payer: Self-pay

## 2018-02-23 DIAGNOSIS — F17210 Nicotine dependence, cigarettes, uncomplicated: Secondary | ICD-10-CM | POA: Diagnosis not present

## 2018-02-23 DIAGNOSIS — Z79899 Other long term (current) drug therapy: Secondary | ICD-10-CM | POA: Insufficient documentation

## 2018-02-23 DIAGNOSIS — Q249 Congenital malformation of heart, unspecified: Secondary | ICD-10-CM | POA: Insufficient documentation

## 2018-02-23 DIAGNOSIS — Z8673 Personal history of transient ischemic attack (TIA), and cerebral infarction without residual deficits: Secondary | ICD-10-CM | POA: Insufficient documentation

## 2018-02-23 DIAGNOSIS — R112 Nausea with vomiting, unspecified: Secondary | ICD-10-CM

## 2018-02-23 DIAGNOSIS — Z7984 Long term (current) use of oral hypoglycemic drugs: Secondary | ICD-10-CM | POA: Diagnosis not present

## 2018-02-23 DIAGNOSIS — E1159 Type 2 diabetes mellitus with other circulatory complications: Secondary | ICD-10-CM | POA: Diagnosis not present

## 2018-02-23 DIAGNOSIS — R630 Anorexia: Secondary | ICD-10-CM | POA: Insufficient documentation

## 2018-02-23 DIAGNOSIS — I10 Essential (primary) hypertension: Secondary | ICD-10-CM | POA: Diagnosis not present

## 2018-02-23 LAB — COMPREHENSIVE METABOLIC PANEL
ALBUMIN: 3.4 g/dL — AB (ref 3.5–5.0)
ALK PHOS: 135 U/L — AB (ref 38–126)
ALT: 10 U/L — ABNORMAL LOW (ref 14–54)
ANION GAP: 14 (ref 5–15)
AST: 18 U/L (ref 15–41)
BILIRUBIN TOTAL: 0.8 mg/dL (ref 0.3–1.2)
BUN: 15 mg/dL (ref 6–20)
CO2: 20 mmol/L — AB (ref 22–32)
Calcium: 10.2 mg/dL (ref 8.9–10.3)
Chloride: 109 mmol/L (ref 101–111)
Creatinine, Ser: 1.26 mg/dL — ABNORMAL HIGH (ref 0.44–1.00)
GFR calc Af Amer: 52 mL/min — ABNORMAL LOW (ref >60)
GFR calc non Af Amer: 45 mL/min — ABNORMAL LOW (ref >60)
GLUCOSE: 75 mg/dL (ref 65–99)
POTASSIUM: 3.6 mmol/L (ref 3.5–5.1)
SODIUM: 143 mmol/L (ref 135–145)
TOTAL PROTEIN: 7 g/dL (ref 6.5–8.1)

## 2018-02-23 LAB — URINALYSIS, ROUTINE W REFLEX MICROSCOPIC
Bilirubin Urine: NEGATIVE
Glucose, UA: NEGATIVE mg/dL
HGB URINE DIPSTICK: NEGATIVE
Ketones, ur: 20 mg/dL — AB
Nitrite: NEGATIVE
PROTEIN: 100 mg/dL — AB
SPECIFIC GRAVITY, URINE: 1.021 (ref 1.005–1.030)
pH: 5 (ref 5.0–8.0)

## 2018-02-23 LAB — CBC
HEMATOCRIT: 32.3 % — AB (ref 36.0–46.0)
HEMOGLOBIN: 9.8 g/dL — AB (ref 12.0–15.0)
MCH: 28.6 pg (ref 26.0–34.0)
MCHC: 30.3 g/dL (ref 30.0–36.0)
MCV: 94.2 fL (ref 78.0–100.0)
Platelets: 556 10*3/uL — ABNORMAL HIGH (ref 150–400)
RBC: 3.43 MIL/uL — ABNORMAL LOW (ref 3.87–5.11)
RDW: 18.4 % — AB (ref 11.5–15.5)
WBC: 7 10*3/uL (ref 4.0–10.5)

## 2018-02-23 LAB — LIPASE, BLOOD: Lipase: 23 U/L (ref 11–51)

## 2018-02-23 MED ORDER — ONDANSETRON 4 MG PO TBDP
4.0000 mg | ORAL_TABLET | Freq: Once | ORAL | Status: AC
Start: 2018-02-23 — End: 2018-02-23
  Administered 2018-02-23: 4 mg via ORAL
  Filled 2018-02-23: qty 1

## 2018-02-23 MED ORDER — ONDANSETRON 4 MG PO TBDP: 4.0000 mg | ORAL_TABLET | Freq: Three times a day (TID) | ORAL | 0 refills | Status: DC | PRN

## 2018-02-23 MED ORDER — SODIUM CHLORIDE 0.9 % IV BOLUS (SEPSIS): 500.0000 mL | Freq: Once | INTRAVENOUS | Status: DC

## 2018-02-23 MED ORDER — ALPRAZOLAM 0.5 MG PO TABS
0.5000 mg | ORAL_TABLET | Freq: Once | ORAL | Status: AC
  Administered 2018-02-23: 0.5 mg via ORAL
  Filled 2018-02-23: qty 1

## 2018-02-23 MED ORDER — ONDANSETRON 4 MG PO TBDP
4.0000 mg | ORAL_TABLET | Freq: Once | ORAL | Status: AC | PRN
  Administered 2018-02-23: 4 mg via ORAL
  Filled 2018-02-23: qty 1

## 2018-02-23 MED ORDER — ONDANSETRON HCL 4 MG/2ML IJ SOLN: 4.0000 mg | Freq: Once | INTRAMUSCULAR | Status: DC

## 2018-02-23 NOTE — ED Notes (Signed)
Pt aware of need for urine specimen. Pt given cup of sprite and water for fluid challenge.

## 2018-02-23 NOTE — ED Triage Notes (Signed)
Patient c/o nausea. Patient denies any other symptoms. Patient states she was having diarrhea, but went to an UC yesterday in [place] and was prescribed Imodium. Patient states that it has helped her.

## 2018-02-23 NOTE — ED Provider Notes (Signed)
[facility] DEPT Provider Note   CSN: [id number] Arrival date & time: 02/23/18  2620     History   Chief Complaint Chief Complaint  Patient presents with  . Nausea    HPI Renee Griffin is a 62 y.o. female.  HPI Patient presents with nausea.  Has been dealing with this for the last weeks but states worse the last few days.  Had had diarrhea but states that is cleared up.  States she was seen at [facility] yesterday for the same and they did nothing.  Reviewed records and she had IV fluids and blood work.  Has had admission to the hospital also around 2 weeks ago for dehydration.  Had some irritation on her upper endoscopy at that time.  States she was not discharge either from the hospital or from [facility] with antiemetics.  Also states she has been out of her Xanax for the last 3 days.  States she does not have the pills because she was unable to get them refilled because she was in the hospital for a week.  Dr. Doylene Canard gives her the medicines.  States she is anxious. Past Medical History:  Diagnosis Date  . Depression   . Diabetes mellitus without complication ([place])   . Heart disease, congenital   . High cholesterol   . Hypertension   . Panic attack   . Stroke [facility])     Patient Active Problem List   Diagnosis Date Noted  . Ovarian mass, left   . Abdominal pain 02/01/2018  . Essential hypertension 03/04/2016  . HLD (hyperlipidemia) 03/04/2016  . Type 2 diabetes mellitus with circulatory disorder ([place]) 03/04/2016  . Obesity 03/04/2016  . CVA (cerebral vascular accident) ([place]) 03/05/2015  . Bilateral low back pain without sciatica 03/05/2015  . Essential hypertension, benign 03/05/2015  . Tobacco use disorder 03/05/2015  . Hyperlipidemia 03/05/2015  . Hyperparathyroidism, primary ([place]) 02/13/2013  . Generalized ischemic cerebrovascular disease 02/06/2013  . Hyperreflexia 02/06/2013  . Abnormality of gait 02/06/2013  . Disturbance of  skin sensation 02/06/2013  . CVA (cerebral infarction) 06/03/2012  . HTN (hypertension) 06/03/2012  . Dyslipidemia 06/03/2012  . Anxiety 06/03/2012    Past Surgical History:  Procedure Laterality Date  . CARDIAC SURGERY  2011   CABG  . COLONOSCOPY WITH PROPOFOL N/A 02/08/2018   Procedure: COLONOSCOPY WITH PROPOFOL;  Surgeon: Otis Brace, MD;  [facility];  Service: Gastroenterology;  Laterality: N/A;  . ESOPHAGOGASTRODUODENOSCOPY (EGD) WITH PROPOFOL Left 02/05/2018   Procedure: ESOPHAGOGASTRODUODENOSCOPY (EGD) WITH PROPOFOL;  Surgeon: Otis Brace, MD;  [facility];  Service: Gastroenterology;  Laterality: Left;  . WISDOM TOOTH EXTRACTION      OB History    No data available       Home Medications    Prior to Admission medications   Medication Sig Start Date End Date Taking? Authorizing Provider  ACCU-CHEK AVIVA PLUS test strip See admin instructions. 01/29/16  Yes [provider]  ACCU-CHEK SOFTCLIX LANCETS lancets See admin instructions. 01/30/16  Yes [provider]  acetaminophen-codeine (TYLENOL #3) 300-30 MG per tablet Take 1-2 tablets by mouth daily as needed for moderate pain. 03/05/15  Yes Rosalin Hawking, MD  ALPRAZolam Duanne Moron) 0.5 MG tablet Take 0.75 mg by mouth daily as needed for anxiety. 01/28/18  Yes [provider]  atorvastatin (LIPITOR) 40 MG tablet TAKE 1 TABLET (40 MG TOTAL) BY MOUTH DAILY. 07/30/15  Yes Rosalin Hawking, MD  Blood Glucose Monitoring Suppl (ACCU-CHEK AVIVA PLUS)  w/Device KIT See admin instructions. 01/29/16  Yes [provider]  Cholecalciferol (VITAMIN D-3) 1000 UNITS CAPS Take 1,000 Units by mouth daily.   Yes [provider]  diltiazem (CARDIZEM) 90 MG tablet Take 1 tablet (90 mg total) by mouth 2 (two) times daily. 02/09/18  Yes Dixie Dials, MD  furosemide (LASIX) 20 MG tablet Take 20 mg by mouth daily.   Yes [provider]  loperamide (IMODIUM) 2 MG capsule Take 2 mg by  mouth 4 (four) times daily as needed for diarrhea or loose stools. 02/22/18  Yes [provider]  metFORMIN (GLUCOPHAGE) 500 MG tablet Take 500 mg by mouth 2 (two) times daily.   Yes [provider]  metoprolol tartrate (LOPRESSOR) 25 MG tablet Take 1 tablet (25 mg total) by mouth 2 (two) times daily. 06/04/12  Yes Oti, Brantley Stage, MD  Multiple Vitamin (MULTIVITAMIN WITH MINERALS) TABS tablet Take 1 tablet by mouth daily.   Yes [provider]  pantoprazole (PROTONIX) 40 MG tablet Take 1 tablet (40 mg total) by mouth 2 (two) times daily. 02/09/18  Yes Dixie Dials, MD  prazosin (MINIPRESS) 1 MG capsule Take 1 capsule (1 mg total) by mouth 2 (two) times daily. 02/09/18  Yes Dixie Dials, MD  valACYclovir (VALTREX) 500 MG tablet Take 500 mg by mouth daily.   Yes [provider]  vitamin C (ASCORBIC ACID) 500 MG tablet Take by mouth.   Yes [provider]  ondansetron (ZOFRAN-ODT) 4 MG disintegrating tablet Take 1 tablet (4 mg total) by mouth every 8 (eight) hours as needed for nausea or vomiting. 02/23/18   Davonna Belling, MD    Family History Family History  Problem Relation Age of Onset  . Cancer Mother        uterian OR cervical  . Cancer Father        lung  . Leukemia Brother   . Cancer Sister        breast    Social History Social History   Tobacco Use  . Smoking status: Current Every Day Smoker    Packs/day: 1.00    Years: 50.00    Pack years: 50.00    Types: Cigarettes  . Smokeless tobacco: Never Used  Substance Use Topics  . Alcohol use: No    Alcohol/week: 0.0 oz  . Drug use: No     Allergies   Amoxicillin-pot clavulanate; Bupropion; and Nicotine   Review of Systems Review of Systems  Constitutional: Positive for appetite change and unexpected weight change.  HENT: Negative for congestion.   Eyes: Negative for photophobia.  Respiratory: Negative for shortness of breath.   Cardiovascular: Negative for chest pain.    Gastrointestinal: Positive for nausea. Negative for abdominal pain and vomiting.  Genitourinary: Negative for flank pain.  Musculoskeletal: Negative for back pain.  Skin: Negative for rash.  Neurological: Negative for light-headedness.  Hematological: Negative for adenopathy.  Psychiatric/Behavioral: Negative for confusion.     Physical Exam Updated Vital Signs BP (!) 154/98   Pulse 94   Temp 97.8 F (36.6 C) (Oral)   Resp 16   Ht '5\' 3"'  (1.6 m)   Wt 72.6 kg (160 lb)   SpO2 95%   BMI 28.34 kg/m   Physical Exam  Constitutional: She appears well-developed.  HENT:  Head: Normocephalic.  Eyes: Pupils are equal, round, and reactive to light.  Neck: Neck supple.  Cardiovascular: Normal rate.  Pulmonary/Chest: Effort normal.  Abdominal: There is tenderness.  Mild upper abdominal  tenderness with no rebound or guarding.  Musculoskeletal: She exhibits no edema.  Neurological: She is alert.  Skin: Skin is warm. Capillary refill takes less than 2 seconds.     ED Treatments / Results  Labs (all labs ordered are listed, but only abnormal results are displayed) Labs Reviewed  COMPREHENSIVE METABOLIC PANEL - Abnormal; Notable for the following components:      Result Value   CO2 20 (*)    Creatinine, Ser 1.26 (*)    Albumin 3.4 (*)    ALT 10 (*)    Alkaline Phosphatase 135 (*)    GFR calc non Af Amer 45 (*)    GFR calc Af Amer 52 (*)    All other components within normal limits  CBC - Abnormal; Notable for the following components:   RBC 3.43 (*)    Hemoglobin 9.8 (*)    HCT 32.3 (*)    RDW 18.4 (*)    Platelets 556 (*)    All other components within normal limits  URINALYSIS, ROUTINE W REFLEX MICROSCOPIC - Abnormal; Notable for the following components:   Ketones, ur 20 (*)    Protein, ur 100 (*)    Leukocytes, UA TRACE (*)    Bacteria, UA RARE (*)    Squamous Epithelial / LPF 0-5 (*)    All other components within normal limits  LIPASE, BLOOD    EKG  EKG  Interpretation None       Radiology No results found.  Procedures Procedures (including critical care time)  Medications Ordered in ED Medications  ondansetron (ZOFRAN-ODT) disintegrating tablet 4 mg (4 mg Oral Given 02/23/18 0848)  ALPRAZolam Duanne Moron) tablet 0.5 mg (0.5 mg Oral Given 02/23/18 1040)  ondansetron (ZOFRAN-ODT) disintegrating tablet 4 mg (4 mg Oral Given 02/23/18 1040)     Initial Impression / Assessment and Plan / ED Course  I have reviewed the triage vital signs and the nursing notes.  Pertinent labs & imaging results that were available during my care of the patient were reviewed by me and considered in my medical decision making (see chart for details).     Patient with nausea vomiting.  Has had some diarrhea to which is resolved.  Feels better after treatment.  Reviewed labs from yesterday at [facility].  Patient wants refill on her Xanax.  However she should still have a few days left by when her prescription was filled and she states she was not able to take them because she was in the hospital for a week so should have more than enough at this time.  Will discharge home to follow-up with her primary care doctor as needed.   Final Clinical Impressions(s) / ED Diagnoses   Final diagnoses:  Non-intractable vomiting with nausea, unspecified vomiting type    ED Discharge Orders        Ordered    ondansetron (ZOFRAN-ODT) 4 MG disintegrating tablet  Every 8 hours PRN     02/23/18 1305       Davonna Belling, MD 02/23/18 1701

## 2018-04-13 ENCOUNTER — Other Ambulatory Visit: Payer: Self-pay

## 2018-04-13 ENCOUNTER — Observation Stay (HOSPITAL_COMMUNITY)
Admission: EM | Admit: 2018-04-13 | Discharge: 2018-04-15 | Disposition: A | Discharge status: Home / Self Care | Payer: Medicare Other | Attending: Cardiovascular Disease | Admitting: Cardiovascular Disease

## 2018-04-13 ENCOUNTER — Encounter (HOSPITAL_COMMUNITY): Payer: Self-pay

## 2018-04-13 ENCOUNTER — Observation Stay (HOSPITAL_COMMUNITY): Payer: Medicare Other

## 2018-04-13 DIAGNOSIS — K2900 Acute gastritis without bleeding: Principal | ICD-10-CM | POA: Insufficient documentation

## 2018-04-13 DIAGNOSIS — N182 Chronic kidney disease, stage 2 (mild): Secondary | ICD-10-CM | POA: Insufficient documentation

## 2018-04-13 DIAGNOSIS — I129 Hypertensive chronic kidney disease with stage 1 through stage 4 chronic kidney disease, or unspecified chronic kidney disease: Secondary | ICD-10-CM | POA: Diagnosis not present

## 2018-04-13 DIAGNOSIS — E7800 Pure hypercholesterolemia, unspecified: Secondary | ICD-10-CM | POA: Insufficient documentation

## 2018-04-13 DIAGNOSIS — E860 Dehydration: Secondary | ICD-10-CM | POA: Diagnosis not present

## 2018-04-13 DIAGNOSIS — R109 Unspecified abdominal pain: Secondary | ICD-10-CM | POA: Diagnosis present

## 2018-04-13 DIAGNOSIS — N839 Noninflammatory disorder of ovary, fallopian tube and broad ligament, unspecified: Secondary | ICD-10-CM | POA: Insufficient documentation

## 2018-04-13 DIAGNOSIS — Z8673 Personal history of transient ischemic attack (TIA), and cerebral infarction without residual deficits: Secondary | ICD-10-CM | POA: Insufficient documentation

## 2018-04-13 DIAGNOSIS — E1122 Type 2 diabetes mellitus with diabetic chronic kidney disease: Secondary | ICD-10-CM | POA: Diagnosis not present

## 2018-04-13 DIAGNOSIS — F410 Panic disorder [episodic paroxysmal anxiety] without agoraphobia: Secondary | ICD-10-CM | POA: Insufficient documentation

## 2018-04-13 DIAGNOSIS — Z951 Presence of aortocoronary bypass graft: Secondary | ICD-10-CM | POA: Insufficient documentation

## 2018-04-13 DIAGNOSIS — Z79899 Other long term (current) drug therapy: Secondary | ICD-10-CM | POA: Insufficient documentation

## 2018-04-13 DIAGNOSIS — E279 Disorder of adrenal gland, unspecified: Secondary | ICD-10-CM | POA: Diagnosis not present

## 2018-04-13 DIAGNOSIS — Z7982 Long term (current) use of aspirin: Secondary | ICD-10-CM | POA: Insufficient documentation

## 2018-04-13 DIAGNOSIS — Z880 Allergy status to penicillin: Secondary | ICD-10-CM | POA: Diagnosis not present

## 2018-04-13 DIAGNOSIS — F17210 Nicotine dependence, cigarettes, uncomplicated: Secondary | ICD-10-CM | POA: Insufficient documentation

## 2018-04-13 LAB — URINALYSIS, ROUTINE W REFLEX MICROSCOPIC
BILIRUBIN URINE: NEGATIVE
Glucose, UA: NEGATIVE mg/dL
HGB URINE DIPSTICK: NEGATIVE
Ketones, ur: 5 mg/dL — AB
NITRITE: NEGATIVE
PH: 5 (ref 5.0–8.0)
Protein, ur: 100 mg/dL — AB
SPECIFIC GRAVITY, URINE: 1.017 (ref 1.005–1.030)

## 2018-04-13 LAB — COMPREHENSIVE METABOLIC PANEL
ALT: 10 U/L — ABNORMAL LOW (ref 14–54)
AST: 18 U/L (ref 15–41)
Albumin: 3.5 g/dL (ref 3.5–5.0)
Alkaline Phosphatase: 165 U/L — ABNORMAL HIGH (ref 38–126)
Anion gap: 17 — ABNORMAL HIGH (ref 5–15)
BUN: 24 mg/dL — ABNORMAL HIGH (ref 6–20)
CO2: 22 mmol/L (ref 22–32)
Calcium: 10.6 mg/dL — ABNORMAL HIGH (ref 8.9–10.3)
Chloride: 101 mmol/L (ref 101–111)
Creatinine, Ser: 1.85 mg/dL — ABNORMAL HIGH (ref 0.44–1.00)
GFR calc Af Amer: 33 mL/min — ABNORMAL LOW (ref >60)
GFR calc non Af Amer: 28 mL/min — ABNORMAL LOW (ref >60)
Glucose, Bld: 89 mg/dL (ref 65–99)
Potassium: 3.9 mmol/L (ref 3.5–5.1)
Sodium: 140 mmol/L (ref 135–145)
Total Bilirubin: 0.8 mg/dL (ref 0.3–1.2)
Total Protein: 7 g/dL (ref 6.5–8.1)

## 2018-04-13 LAB — LIPASE, BLOOD: LIPASE: 21 U/L (ref 11–51)

## 2018-04-13 LAB — CBC
HCT: 35.7 % — ABNORMAL LOW (ref 36.0–46.0)
Hemoglobin: 11.1 g/dL — ABNORMAL LOW (ref 12.0–15.0)
MCH: 29.3 pg (ref 26.0–34.0)
MCHC: 31.1 g/dL (ref 30.0–36.0)
MCV: 94.2 fL (ref 78.0–100.0)
Platelets: 467 10*3/uL — ABNORMAL HIGH (ref 150–400)
RBC: 3.79 MIL/uL — ABNORMAL LOW (ref 3.87–5.11)
RDW: 17.5 % — ABNORMAL HIGH (ref 11.5–15.5)
WBC: 9.5 10*3/uL (ref 4.0–10.5)

## 2018-04-13 MED ORDER — HEPARIN SODIUM (PORCINE) 5000 UNIT/ML IJ SOLN
5000.0000 [IU] | Freq: Three times a day (TID) | INTRAMUSCULAR | Status: DC
  Administered 2018-04-13 – 2018-04-15 (×6): 5000 [IU] via SUBCUTANEOUS
  Filled 2018-04-13 (×6): qty 1

## 2018-04-13 MED ORDER — LOPERAMIDE HCL 2 MG PO CAPS: 2.0000 mg | ORAL_CAPSULE | Freq: Four times a day (QID) | ORAL | Status: DC | PRN

## 2018-04-13 MED ORDER — METOPROLOL TARTRATE 25 MG PO TABS: 25.0000 mg | ORAL_TABLET | Freq: Two times a day (BID) | ORAL | Status: DC

## 2018-04-13 MED ORDER — ONDANSETRON 4 MG PO TBDP
4.0000 mg | ORAL_TABLET | Freq: Once | ORAL | Status: AC | PRN
  Administered 2018-04-13: 4 mg via ORAL
  Filled 2018-04-13: qty 1

## 2018-04-13 MED ORDER — METFORMIN HCL 500 MG PO TABS
500.0000 mg | ORAL_TABLET | Freq: Two times a day (BID) | ORAL | Status: DC
  Administered 2018-04-14 – 2018-04-15 (×3): 500 mg via ORAL
  Filled 2018-04-13 (×3): qty 1

## 2018-04-13 MED ORDER — SODIUM CHLORIDE 0.9 % IV SOLN
INTRAVENOUS | Status: DC
  Administered 2018-04-13: via INTRAVENOUS

## 2018-04-13 MED ORDER — PRAZOSIN HCL 1 MG PO CAPS
1.0000 mg | ORAL_CAPSULE | Freq: Two times a day (BID) | ORAL | Status: DC
  Administered 2018-04-14 – 2018-04-15 (×3): 1 mg via ORAL
  Filled 2018-04-13 (×5): qty 1

## 2018-04-13 MED ORDER — VITAMIN C 500 MG PO TABS
250.0000 mg | ORAL_TABLET | Freq: Every day | ORAL | Status: DC
  Administered 2018-04-14 – 2018-04-15 (×2): 250 mg via ORAL
  Filled 2018-04-13 (×2): qty 1

## 2018-04-13 MED ORDER — VALACYCLOVIR HCL 500 MG PO TABS
500.0000 mg | ORAL_TABLET | Freq: Every day | ORAL | Status: DC
  Administered 2018-04-14 – 2018-04-15 (×2): 500 mg via ORAL
  Filled 2018-04-13 (×2): qty 1

## 2018-04-13 MED ORDER — ATORVASTATIN CALCIUM 40 MG PO TABS: 40.0000 mg | ORAL_TABLET | Freq: Every day | ORAL | Status: DC

## 2018-04-13 MED ORDER — ALPRAZOLAM 0.25 MG PO TABS
0.2500 mg | ORAL_TABLET | Freq: Three times a day (TID) | ORAL | Status: DC
  Administered 2018-04-13 – 2018-04-15 (×6): 0.25 mg via ORAL
  Filled 2018-04-13 (×6): qty 1

## 2018-04-13 MED ORDER — VITAMIN D 1000 UNITS PO TABS
1000.0000 [IU] | ORAL_TABLET | Freq: Every day | ORAL | Status: DC
  Administered 2018-04-14 – 2018-04-15 (×2): 1000 [IU] via ORAL
  Filled 2018-04-13 (×2): qty 1

## 2018-04-13 MED ORDER — ONDANSETRON 4 MG PO TBDP: 4.0000 mg | ORAL_TABLET | Freq: Three times a day (TID) | ORAL | Status: DC | PRN

## 2018-04-13 MED ORDER — PANTOPRAZOLE SODIUM 40 MG PO TBEC
40.0000 mg | DELAYED_RELEASE_TABLET | Freq: Two times a day (BID) | ORAL | Status: DC
  Administered 2018-04-13 – 2018-04-15 (×4): 40 mg via ORAL
  Filled 2018-04-13 (×4): qty 1

## 2018-04-13 MED ORDER — DILTIAZEM HCL 60 MG PO TABS
90.0000 mg | ORAL_TABLET | Freq: Two times a day (BID) | ORAL | Status: DC
  Administered 2018-04-13 – 2018-04-15 (×4): 90 mg via ORAL
  Filled 2018-04-13: qty 1
  Filled 2018-04-13 (×4): qty 2

## 2018-04-13 MED ORDER — ACETAMINOPHEN-CODEINE #3 300-30 MG PO TABS
1.0000 | ORAL_TABLET | Freq: Every day | ORAL | Status: DC
  Administered 2018-04-13 – 2018-04-14 (×2): 1 via ORAL
  Filled 2018-04-13 (×2): qty 1

## 2018-04-13 NOTE — ED Provider Notes (Signed)
Referring Physician:  [name] is an 62 y.o. female.                       Chief Complaint: Abdominal pain and vomiting  HPI: 62 year old female has nausea and vomiting x 4 days. She is able to keep most of her medications down. She also reports diarrhea off and on x 4 days. She had acute gastritis 2 months ago along with acute GI bleeding. She denies rectal bleed but feels weak. No fever.      Past Medical History:  Diagnosis Date  . Depression   . Diabetes mellitus without complication ([place])   . Heart disease, congenital   . High cholesterol   . Hypertension   . Panic attack   . Stroke [facility])            Past Surgical History:  Procedure Laterality Date  . CARDIAC SURGERY  2011   CABG  . COLONOSCOPY WITH PROPOFOL N/A 02/08/2018   Procedure: COLONOSCOPY WITH PROPOFOL;  Surgeon: Otis Brace, MD;  [facility];  Service: Gastroenterology;  Laterality: N/A;  . ESOPHAGOGASTRODUODENOSCOPY (EGD) WITH PROPOFOL Left 02/05/2018   Procedure: ESOPHAGOGASTRODUODENOSCOPY (EGD) WITH PROPOFOL;  Surgeon: Otis Brace, MD;  [facility];  Service: Gastroenterology;  Laterality: Left;  . WISDOM TOOTH EXTRACTION           Family History  Problem Relation Age of Onset  . Cancer Mother        uterian OR cervical  . Cancer Father        lung  . Leukemia Brother   . Cancer Sister        breast   Social History:  reports that she has been smoking cigarettes.  She has a 50.00 pack-year smoking history. She has never used smokeless tobacco. She reports that she does not drink alcohol or use drugs.  Allergies:       Allergies  Allergen Reactions  . Amoxicillin-Pot Clavulanate Diarrhea    Has patient had a PCN reaction causing immediate rash, facial/tongue/throat swelling, SOB or lightheadedness with hypotension: Unknown Has patient had a PCN reaction causing severe rash involving mucus membranes or skin necrosis:UnknoWN Has  patient had a PCN reaction that required hospitalization: Unknown Has patient had a PCN reaction occurring within the last 10 years: Unknown If all of the above answers are "NO", then may proceed with Cephalosporin use.   . Bupropion Nausea Only  . Nicotine Nausea Only    Used patches only.     (Not in a hospital admission)  LabResultsLast48Hours        Results for orders placed or performed during the hospital encounter of 04/13/18 (from the past 48 hour(s))  Lipase, blood     Status: None   Collection Time: 04/13/18  4:08 PM  Result Value Ref Range   Lipase 21 11 - 51 U/L    Comment: Performed at [facility], [place] [address] [place], [place] [address]  Comprehensive metabolic panel     Status: Abnormal   Collection Time: 04/13/18  4:08 PM  Result Value Ref Range   Sodium 140 135 - 145 mmol/L   Potassium 3.9 3.5 - 5.1 mmol/L   Chloride 101 101 - 111 mmol/L   CO2 22 22 - 32 mmol/L   Glucose, Bld 89 65 - 99 mg/dL   BUN 24 (H) 6 - 20 mg/dL   Creatinine, Ser 1.85 (H) 0.44 - 1.00 mg/dL  Calcium 10.6 (H) 8.9 - 10.3 mg/dL   Total Protein 7.0 6.5 - 8.1 g/dL   Albumin 3.5 3.5 - 5.0 g/dL   AST 18 15 - 41 U/L   ALT 10 (L) 14 - 54 U/L   Alkaline Phosphatase 165 (H) 38 - 126 U/L   Total Bilirubin 0.8 0.3 - 1.2 mg/dL   GFR calc non Af Amer 28 (L) >60 mL/min   GFR calc Af Amer 33 (L) >60 mL/min    Comment: (NOTE) The eGFR has been calculated using the CKD EPI equation. This calculation has not been validated in all clinical situations. eGFR's persistently <60 mL/min signify possible Chronic Kidney Disease.    Anion gap 17 (H) 5 - 15    Comment: Performed at [facility], [place] [address] [place], [place] [address]  CBC     Status: Abnormal   Collection Time: 04/13/18  4:08 PM  Result Value Ref Range   WBC 9.5 4.0 - 10.5 K/uL   RBC 3.79 (L) 3.87 - 5.11 MIL/uL   Hemoglobin 11.1 (L) 12.0 - 15.0 g/dL   HCT 35.7 (L) 36.0 - 46.0  %   MCV 94.2 78.0 - 100.0 fL   MCH 29.3 26.0 - 34.0 pg   MCHC 31.1 30.0 - 36.0 g/dL   RDW 17.5 (H) 11.5 - 15.5 %   Platelets 467 (H) 150 - 400 K/uL    Comment: Performed at [facility], [place] [address] [place], [place] [address]  Urinalysis, Routine w reflex microscopic     Status: Abnormal   Collection Time: 04/13/18  4:10 PM  Result Value Ref Range   Color, Urine YELLOW YELLOW   APPearance HAZY (A) CLEAR   Specific Gravity, Urine 1.017 1.005 - 1.030   pH 5.0 5.0 - 8.0   Glucose, UA NEGATIVE NEGATIVE mg/dL   Hgb urine dipstick NEGATIVE NEGATIVE   Bilirubin Urine NEGATIVE NEGATIVE   Ketones, ur 5 (A) NEGATIVE mg/dL   Protein, ur 100 (A) NEGATIVE mg/dL   Nitrite NEGATIVE NEGATIVE   Leukocytes, UA MODERATE (A) NEGATIVE   RBC / HPF 0-5 0 - 5 RBC/hpf   WBC, UA 6-30 0 - 5 WBC/hpf   Bacteria, UA RARE (A) NONE SEEN   Squamous Epithelial / LPF 0-5 (A) NONE SEEN   Mucus PRESENT    Hyaline Casts, UA PRESENT     Comment: Performed at [facility], [place] [address] [place], [place] [address]     ImagingResults(Last48hours)  No results found.    Review Of Systems Constitutional: No fever, chills, weight loss or gain. Eyes: No vision change, wears glasses. No discharge or pain. Ears: No hearing loss, No tinnitus. Respiratory: No asthma, COPD, pneumonias. positive shortness of breath. No hemoptysis. Cardiovascular: No chest pain, palpitation, leg edema. Gastrointestinal: Positive nausea, vomiting, diarrhea, no constipation. H/O GI bleed. No hepatitis. Genitourinary: No dysuria, hematuria, kidney stone. No incontinance. Neurological: No headache, stroke, seizures.  Psychiatry: No psych facility admission for anxiety, depression, suicide. No detox. Skin: No rash. Musculoskeletal: Positive joint pain, fibromyalgia, neck pain, back pain. Lymphadenopathy: No lymphadenopathy. Hematology: Positive anemia,No easy bruising.   Blood pressure  (!) 130/38, pulse (!) 110, temperature 98 F (36.7 C), temperature source Oral, resp. rate 16, SpO2 97 %. There is no height or weight on file to calculate BMI. General appearance: alert, cooperative, appears stated age and no distress Head: Normocephalic, atraumatic. Eyes: Brown eyes, pale pink conjunctiva, corneas clear. PERRL, EOM's intact. Neck: No  adenopathy, no carotid bruit, no JVD, supple, symmetrical, trachea midline and thyroid not enlarged. Resp: Clear to auscultation bilaterally. Cardio: Regular rate and rhythm, S1, S2 normal, II/VI systolic murmur, no click, rub or gallop GI: Soft, genralized-tenderness; bowel sounds normal; no organomegaly. Extremities: No edema, cyanosis or clubbing. Skin: Warm and dry.  Neurologic: Alert and oriented X 3, normal strength. Normal coordination and gait.  Assessment/Plan Abdominal pain R/O obstruction Acute gastritis Dehydration CKD, II Hypertension Large left ovarian mass Large left adrenal mass Tobacco use disorder  Place in observation. IV fluids. Advance diet as tolerated.   [name], MD  04/13/2018, 8:54 PM

## 2018-04-13 NOTE — ED Triage Notes (Signed)
Patient here for abdominal pain and associated emesis.  Not able to eat solid food for 4 days, able to tolerate water at this time.  A&Ox4.  Symptoms relieved by rest.

## 2018-04-13 NOTE — ED Notes (Signed)
RN attempted to call report 

## 2018-04-13 NOTE — ED Notes (Signed)
Patient transported to X-ray 

## 2018-04-13 NOTE — ED Notes (Signed)
Attempted to call report, RN not available, Agricultural consultant not available

## 2018-04-13 NOTE — H&P (Signed)
Referring Physician:  [name] is an 62 y.o. female.                       Chief Complaint: Abdominal pain and vomiting  HPI: 62 year old female has nausea and vomiting x 4 days. She is able to keep most of her medications down. She also reports diarrhea off and on x 4 days. She had acute gastritis 2 months ago along with acute GI bleeding. She denies rectal bleed but feels weak. No fever.  Past Medical History:  Diagnosis Date  . Depression   . Diabetes mellitus without complication ([place])   . Heart disease, congenital   . High cholesterol   . Hypertension   . Panic attack   . Stroke [facility])       Past Surgical History:  Procedure Laterality Date  . CARDIAC SURGERY  2011   CABG  . COLONOSCOPY WITH PROPOFOL N/A 02/08/2018   Procedure: COLONOSCOPY WITH PROPOFOL;  Surgeon: Otis Brace, MD;  [facility];  Service: Gastroenterology;  Laterality: N/A;  . ESOPHAGOGASTRODUODENOSCOPY (EGD) WITH PROPOFOL Left 02/05/2018   Procedure: ESOPHAGOGASTRODUODENOSCOPY (EGD) WITH PROPOFOL;  Surgeon: Otis Brace, MD;  [facility];  Service: Gastroenterology;  Laterality: Left;  . WISDOM TOOTH EXTRACTION      Family History  Problem Relation Age of Onset  . Cancer Mother        uterian OR cervical  . Cancer Father        lung  . Leukemia Brother   . Cancer Sister        breast   Social History:  reports that she has been smoking cigarettes.  She has a 50.00 pack-year smoking history. She has never used smokeless tobacco. She reports that she does not drink alcohol or use drugs.  Allergies:  Allergies  Allergen Reactions  . Amoxicillin-Pot Clavulanate Diarrhea    Has patient had a PCN reaction causing immediate rash, facial/tongue/throat swelling, SOB or lightheadedness with hypotension: Unknown Has patient had a PCN reaction causing severe rash involving mucus membranes or skin necrosis:UnknoWN Has patient had a PCN reaction that required hospitalization:  Unknown Has patient had a PCN reaction occurring within the last 10 years: Unknown If all of the above answers are "NO", then may proceed with Cephalosporin use.   . Bupropion Nausea Only  . Nicotine Nausea Only    Used patches only.     (Not in a hospital admission)  Results for orders placed or performed during the hospital encounter of 04/13/18 (from the past 48 hour(s))  Lipase, blood     Status: None   Collection Time: 04/13/18  4:08 PM  Result Value Ref Range   Lipase 21 11 - 51 U/L    Comment: Performed at [facility], [place] [address] [place], [place] [address]  Comprehensive metabolic panel     Status: Abnormal   Collection Time: 04/13/18  4:08 PM  Result Value Ref Range   Sodium 140 135 - 145 mmol/L   Potassium 3.9 3.5 - 5.1 mmol/L   Chloride 101 101 - 111 mmol/L   CO2 22 22 - 32 mmol/L   Glucose, Bld 89 65 - 99 mg/dL   BUN 24 (H) 6 - 20 mg/dL   Creatinine, Ser 1.85 (H) 0.44 - 1.00 mg/dL   Calcium 10.6 (H) 8.9 - 10.3 mg/dL   Total Protein 7.0 6.5 - 8.1 g/dL   Albumin 3.5 3.5 - 5.0 g/dL  AST 18 15 - 41 U/L   ALT 10 (L) 14 - 54 U/L   Alkaline Phosphatase 165 (H) 38 - 126 U/L   Total Bilirubin 0.8 0.3 - 1.2 mg/dL   GFR calc non Af Amer 28 (L) >60 mL/min   GFR calc Af Amer 33 (L) >60 mL/min    Comment: (NOTE) The eGFR has been calculated using the CKD EPI equation. This calculation has not been validated in all clinical situations. eGFR's persistently <60 mL/min signify possible Chronic Kidney Disease.    Anion gap 17 (H) 5 - 15    Comment: Performed at [facility], [place] [address] [place], [place] [address]  CBC     Status: Abnormal   Collection Time: 04/13/18  4:08 PM  Result Value Ref Range   WBC 9.5 4.0 - 10.5 K/uL   RBC 3.79 (L) 3.87 - 5.11 MIL/uL   Hemoglobin 11.1 (L) 12.0 - 15.0 g/dL   HCT 35.7 (L) 36.0 - 46.0 %   MCV 94.2 78.0 - 100.0 fL   MCH 29.3 26.0 - 34.0 pg   MCHC 31.1 30.0 - 36.0 g/dL   RDW 17.5 (H) 11.5 - 15.5 %   Platelets  467 (H) 150 - 400 K/uL    Comment: Performed at [facility], [place] [address], [place], [place] [address]  Urinalysis, Routine w reflex microscopic     Status: Abnormal   Collection Time: 04/13/18  4:10 PM  Result Value Ref Range   Color, Urine YELLOW YELLOW   APPearance HAZY (A) CLEAR   Specific Gravity, Urine 1.017 1.005 - 1.030   pH 5.0 5.0 - 8.0   Glucose, UA NEGATIVE NEGATIVE mg/dL   Hgb urine dipstick NEGATIVE NEGATIVE   Bilirubin Urine NEGATIVE NEGATIVE   Ketones, ur 5 (A) NEGATIVE mg/dL   Protein, ur 100 (A) NEGATIVE mg/dL   Nitrite NEGATIVE NEGATIVE   Leukocytes, UA MODERATE (A) NEGATIVE   RBC / HPF 0-5 0 - 5 RBC/hpf   WBC, UA 6-30 0 - 5 WBC/hpf   Bacteria, UA RARE (A) NONE SEEN   Squamous Epithelial / LPF 0-5 (A) NONE SEEN   Mucus PRESENT    Hyaline Casts, UA PRESENT     Comment: Performed at [facility], [place] [address] [place],  [address]   No results found.  Review Of Systems Constitutional: No fever, chills, weight loss or gain. Eyes: No vision change, wears glasses. No discharge or pain. Ears: No hearing loss, No tinnitus. Respiratory: No asthma, COPD, pneumonias. positive shortness of breath. No hemoptysis. Cardiovascular: No chest pain, palpitation, leg edema. Gastrointestinal: Positive nausea, vomiting, diarrhea, no constipation. H/O GI bleed. No hepatitis. Genitourinary: No dysuria, hematuria, kidney stone. No incontinance. Neurological: No headache, stroke, seizures.  Psychiatry: No psych facility admission for anxiety, depression, suicide. No detox. Skin: No rash. Musculoskeletal: Positive joint pain, fibromyalgia, neck pain, back pain. Lymphadenopathy: No lymphadenopathy. Hematology: Positive anemia,No easy bruising.   Blood pressure (!) 130/38, pulse (!) 110, temperature 98 F (36.7 C), temperature source Oral, resp. rate 16, SpO2 97 %. There is no height or weight on file to calculate BMI. General appearance: alert, cooperative,  appears stated age and no distress Head: Normocephalic, atraumatic. Eyes: Brown eyes, pale pink conjunctiva, corneas clear. PERRL, EOM's intact. Neck: No adenopathy, no carotid bruit, no JVD, supple, symmetrical, trachea midline and thyroid not enlarged. Resp: Clear to auscultation bilaterally. Cardio: Regular rate and rhythm, S1, S2 normal, II/VI systolic murmur, no click,  rub or gallop GI: Soft, genralized-tenderness; bowel sounds normal; no organomegaly. Extremities: No edema, cyanosis or clubbing. Skin: Warm and dry.  Neurologic: Alert and oriented X 3, normal strength. Normal coordination and gait.  Assessment/Plan Abdominal pain R/O obstruction Acute gastritis Dehydration CKD, II Hypertension Large left ovarian mass Large left adrenal mass Tobacco use disorder  Place in observation. IV fluids. Advance diet as tolerated.   [name], MD  04/13/2018, 8:54 PM

## 2018-04-13 NOTE — ED Notes (Addendum)
Admitting provider bedside.  Expressed that pt was going to be admitted

## 2018-04-13 NOTE — ED Notes (Signed)
Pharmacy Tec informed RN that pt has all her medications w/ her and that she should a week of her Renee Griffin and Tylenol 3 however she only has empty bottles.  RN called floor RN and informed her.  Pt does not have anyone to take medications home.

## 2018-04-13 NOTE — ED Notes (Addendum)
Pt poor historian, states that her Doctor says "I'm dehydrated and told me to come in."  Pt unable to give symptoms other than "I have not wanted to eat."  No complaints.  Pt given RN phone by provider, to call her brother, contact was made and message of admission was given to brother.

## 2018-04-14 DIAGNOSIS — K2900 Acute gastritis without bleeding: Secondary | ICD-10-CM | POA: Diagnosis not present

## 2018-04-14 LAB — BASIC METABOLIC PANEL
Anion gap: 15 (ref 5–15)
BUN: 26 mg/dL — AB (ref 6–20)
CHLORIDE: 104 mmol/L (ref 101–111)
CO2: 21 mmol/L — ABNORMAL LOW (ref 22–32)
Calcium: 9.9 mg/dL (ref 8.9–10.3)
Creatinine, Ser: 1.93 mg/dL — ABNORMAL HIGH (ref 0.44–1.00)
GFR calc Af Amer: 31 mL/min — ABNORMAL LOW (ref >60)
GFR calc non Af Amer: 27 mL/min — ABNORMAL LOW (ref >60)
Glucose, Bld: 69 mg/dL (ref 65–99)
POTASSIUM: 4 mmol/L (ref 3.5–5.1)
SODIUM: 140 mmol/L (ref 135–145)

## 2018-04-14 LAB — CBC
HEMATOCRIT: 32.4 % — AB (ref 36.0–46.0)
Hemoglobin: 10.1 g/dL — ABNORMAL LOW (ref 12.0–15.0)
MCH: 29.4 pg (ref 26.0–34.0)
MCHC: 31.2 g/dL (ref 30.0–36.0)
MCV: 94.2 fL (ref 78.0–100.0)
Platelets: 401 10*3/uL — ABNORMAL HIGH (ref 150–400)
RBC: 3.44 MIL/uL — ABNORMAL LOW (ref 3.87–5.11)
RDW: 17.6 % — AB (ref 11.5–15.5)
WBC: 7.9 10*3/uL (ref 4.0–10.5)

## 2018-04-14 NOTE — Progress Notes (Signed)
Ref: Dixie Dials, MD   Subjective:  Nausea and vomiting under control. No diarrhea. Creatinine 1.93. X-ray abdomen was unremarkable.  Objective:  Vital Signs in the last 24 hours: Temp:  [98 F (36.7 C)-98.9 F (37.2 C)] 98.9 F (37.2 C) (04/20 0508) Pulse Rate:  [82-110] 82 (04/20 0508) Cardiac Rhythm: Normal sinus rhythm (04/20 0712) Resp:  [16-18] 18 (04/20 0508) BP: (130-165)/(38-96) 151/76 (04/20 0508) SpO2:  [96 %-100 %] 96 % (04/20 0508)  Physical Exam: BP Readings from Last 1 Encounters:  04/14/18 (!) 151/76     Wt Readings from Last 1 Encounters:  02/23/18 72.6 kg (160 lb)    Weight change:  There is no height or weight on file to calculate BMI. HEENT: [place]/AT, Eyes-Brown, PERL, EOMI, Conjunctiva-Pink, Sclera-Non-icteric Neck: No JVD, No bruit, Trachea midline. Lungs:  Clear, Bilateral. Cardiac:  Regular rhythm, normal S1 and S2, no S3. II/VI systolic murmur. Abdomen:  Soft, non-tender. BS present. Extremities:  No edema present. No cyanosis. No clubbing. CNS: AxOx3, Cranial nerves grossly intact, moves all 4 extremities.  Skin: Warm and dry.   Intake/Output from previous day: No intake/output data recorded.    Lab Results: BMET    Component Value Date/Time   NA 140 04/14/2018 0629   NA 140 04/13/2018 1608   NA 143 02/23/2018 0846   K 4.0 04/14/2018 0629   K 3.9 04/13/2018 1608   K 3.6 02/23/2018 0846   CL 104 04/14/2018 0629   CL 101 04/13/2018 1608   CL 109 02/23/2018 0846   CO2 21 (L) 04/14/2018 0629   CO2 22 04/13/2018 1608   CO2 20 (L) 02/23/2018 0846   GLUCOSE 69 04/14/2018 0629   GLUCOSE 89 04/13/2018 1608   GLUCOSE 75 02/23/2018 0846   BUN 26 (H) 04/14/2018 0629   BUN 24 (H) 04/13/2018 1608   BUN 15 02/23/2018 0846   CREATININE 1.93 (H) 04/14/2018 0629   CREATININE 1.85 (H) 04/13/2018 1608   CREATININE 1.26 (H) 02/23/2018 0846   CALCIUM 9.9 04/14/2018 0629   CALCIUM 10.6 (H) 04/13/2018 1608   CALCIUM 10.2 02/23/2018 0846   GFRNONAA  27 (L) 04/14/2018 0629   GFRNONAA 28 (L) 04/13/2018 1608   GFRNONAA 45 (L) 02/23/2018 0846   GFRAA 31 (L) 04/14/2018 0629   GFRAA 33 (L) 04/13/2018 1608   GFRAA 52 (L) 02/23/2018 0846   CBC    Component Value Date/Time   WBC 7.9 04/14/2018 0629   RBC 3.44 (L) 04/14/2018 0629   HGB 10.1 (L) 04/14/2018 0629   HCT 32.4 (L) 04/14/2018 0629   PLT 401 (H) 04/14/2018 0629   MCV 94.2 04/14/2018 0629   MCH 29.4 04/14/2018 0629   MCHC 31.2 04/14/2018 0629   RDW 17.6 (H) 04/14/2018 0629   LYMPHSABS 1.1 02/01/2018 1249   MONOABS 1.0 02/01/2018 1249   EOSABS 0.0 02/01/2018 1249   BASOSABS 0.0 02/01/2018 1249   HEPATIC Function Panel Recent Labs    02/07/18 0404 02/23/18 0846 04/13/18 1608  PROT 5.7* 7.0 7.0   HEMOGLOBIN A1C No components found for: HGA1C,  MPG CARDIAC ENZYMES Lab Results  Component Value Date   CKTOTAL 735 (H) 02/01/2018   BNP No results for input(s): PROBNP in the last 8760 hours. TSH No results for input(s): TSH in the last 8760 hours. CHOLESTEROL No results for input(s): CHOL in the last 8760 hours.  Scheduled Meds: . acetaminophen-codeine  1 tablet Oral QHS  . ALPRAZolam  0.25 mg Oral TID  . cholecalciferol  1,000  Units Oral Daily  . diltiazem  90 mg Oral BID  . heparin  5,000 Units Subcutaneous Q8H  . metFORMIN  500 mg Oral BID WC  . pantoprazole  40 mg Oral BID  . prazosin  1 mg Oral BID  . valACYclovir  500 mg Oral Daily  . vitamin C  250 mg Oral Daily   Continuous Infusions: . sodium chloride 75 mL/hr at 04/13/18 2331   PRN Meds:.loperamide  Assessment/Plan:   Abdominal pain Obstruction ruled out Acute gastritis Dehydration CKD, IV Hypertension Tobacco use disorder Large left ovarian mass Large left adrenal mass  Advance diet Continue IV fluids. Increase activity. Home in AM.   LOS: 0 days    Dixie Dials  MD  04/14/2018, 12:36 PM

## 2018-04-15 DIAGNOSIS — K2900 Acute gastritis without bleeding: Secondary | ICD-10-CM | POA: Diagnosis not present

## 2018-04-15 LAB — BASIC METABOLIC PANEL
ANION GAP: 8 (ref 5–15)
BUN: 17 mg/dL (ref 6–20)
CALCIUM: 9.4 mg/dL (ref 8.9–10.3)
CO2: 22 mmol/L (ref 22–32)
Chloride: 109 mmol/L (ref 101–111)
Creatinine, Ser: 1.43 mg/dL — ABNORMAL HIGH (ref 0.44–1.00)
GFR calc Af Amer: 44 mL/min — ABNORMAL LOW (ref >60)
GFR calc non Af Amer: 38 mL/min — ABNORMAL LOW (ref >60)
GLUCOSE: 120 mg/dL — AB (ref 65–99)
Potassium: 3.9 mmol/L (ref 3.5–5.1)
Sodium: 139 mmol/L (ref 135–145)

## 2018-04-15 MED ORDER — METFORMIN HCL 500 MG PO TABS: 500.0000 mg | ORAL_TABLET | Freq: Two times a day (BID) | ORAL | 6 refills | Status: DC

## 2018-04-15 MED ORDER — FUROSEMIDE 20 MG PO TABS: 20.0000 mg | ORAL_TABLET | ORAL | Status: DC

## 2018-04-15 NOTE — Discharge Summary (Signed)
Physician Discharge Summary  Patient ID: Renee Griffin MRN: [id number] DOB/AGE: 63-26-1957 62 y.o.  Admit date: 04/13/2018 Discharge date: 04/15/2018  Admission Diagnoses: Abdominal pain R/O obstruction Acute gastritis Dehydration CKD, II Hypertension Large ovarian mass Large left adrenal mass Tobacco use disorder  Discharge Diagnoses:  Principle problem: *Abdominal Pain* Active Problems:   Acute gastritis   Dehydration   CKD, II   Hypertension   Large left ovarian mass   Large left adrenal mass   Tobacco use disorder   H/O Anemia of GI blood loss, improving  Discharged Condition: fair  Hospital Course: 62 year old female had nausea and vomiting along with diarrhea x 4 days. She denies rectal bleed. She had acute gastritis 2 months ago. She responded to IV fluids and liquid diet with IV Zofran and PO Protonix use. Her renal function stabilized with IV fluids. She was discharged home in stable condition with f/u by me in 1 week.  Consults: cardiology  Significant Diagnostic Studies: labs: Improving Hgb of 10.1 g. Electrolytes are normal, creatinine elevated at 1.85 and improving to 1.43 post hydration. Normal Lipase.   X-ray abdomen: Unremarkable.  Treatments: IV hydration and Zofran and Protonix.  Discharge Exam: Blood pressure 137/83, pulse 76, temperature 97.9 F (36.6 C), temperature source Oral, resp. rate (!) 22, SpO2 99 %. General appearance: alert, cooperative and appears stated age. Head: Normocephalic, atraumatic. Eyes: Brown eyes, pink conjunctiva, corneas clear. PERRL, EOM's intact.  Neck: No adenopathy, no carotid bruit, no JVD, supple, symmetrical, trachea midline and thyroid not enlarged. Resp: Clear to auscultation bilaterally. Cardio: Regular rate and rhythm, S1, S2 normal, II/VI systolic murmur, no click, rub or gallop. GI: Soft, non-tender; bowel sounds normal; no organomegaly. Extremities: No edema, cyanosis or clubbing. Skin: Warm and dry.   Neurologic: Alert and oriented X 3, normal strength and tone. Normal coordination and slow gait with cane use.  Disposition: Discharge disposition: 01-Home or Self Care        Allergies as of 04/15/2018      Reactions   Amoxicillin-pot Clavulanate Diarrhea   Has patient had a PCN reaction causing immediate rash, facial/tongue/throat swelling, SOB or lightheadedness with hypotension: Unknown Has patient had a PCN reaction causing severe rash involving mucus membranes or skin necrosis:UnknoWN Has patient had a PCN reaction that required hospitalization: Unknown Has patient had a PCN reaction occurring within the last 10 years: Unknown If all of the above answers are "NO", then may proceed with Cephalosporin use.   Bupropion Nausea Only   Nicotine Nausea Only   Used patches only.      Medication List    STOP taking these medications   acetaminophen 500 MG tablet Commonly known as:  TYLENOL   aspirin EC 325 MG tablet   atorvastatin 40 MG tablet Commonly known as:  LIPITOR   B-complex with vitamin C tablet   metoprolol tartrate 25 MG tablet Commonly known as:  LOPRESSOR   ondansetron 4 MG disintegrating tablet Commonly known as:  ZOFRAN-ODT     TAKE these medications   ACCU-CHEK AVIVA PLUS test strip Generic drug:  glucose blood See admin instructions.   ACCU-CHEK AVIVA PLUS w/Device Kit See admin instructions.   ACCU-CHEK SOFTCLIX LANCETS lancets See admin instructions.   acetaminophen-codeine 300-30 MG tablet Commonly known as:  TYLENOL #3 Take 1-2 tablets by mouth daily as needed for moderate pain. What changed:    how much to take  when to take this   ALPRAZolam 0.5 MG tablet Commonly known  as:  XANAX Take 0.25 mg by mouth 3 (three) times daily.   diltiazem 90 MG tablet Commonly known as:  CARDIZEM Take 1 tablet (90 mg total) by mouth 2 (two) times daily.   furosemide 20 MG tablet Commonly known as:  LASIX Take 1 tablet (20 mg total) by mouth  every Monday, Wednesday, and Friday. Start taking on:  04/16/2018 What changed:  when to take this   gemfibrozil 600 MG tablet Commonly known as:  LOPID Take 600 mg by mouth 2 (two) times daily.   loperamide 2 MG capsule Commonly known as:  IMODIUM Take 2 mg by mouth daily as needed for diarrhea or loose stools.   metFORMIN 500 MG tablet Commonly known as:  GLUCOPHAGE Take 1 tablet (500 mg total) by mouth 2 (two) times daily.   multivitamin with minerals Tabs tablet Take 1 tablet by mouth daily.   oxymetazoline 0.05 % nasal spray Commonly known as:  AFRIN Place 1 spray into both nostrils 2 (two) times daily.   pantoprazole 40 MG tablet Commonly known as:  PROTONIX Take 1 tablet (40 mg total) by mouth 2 (two) times daily.   potassium chloride 10 MEQ tablet Commonly known as:  K-DUR,KLOR-CON Take 10 mEq by mouth daily.   prazosin 1 MG capsule Commonly known as:  MINIPRESS Take 1 capsule (1 mg total) by mouth 2 (two) times daily.   TURMERIC PO Take 1,440 mg by mouth daily.   valACYclovir 500 MG tablet Commonly known as:  VALTREX Take 500 mg by mouth daily.   Vitamin D-3 1000 units Caps Take 1,000 Units by mouth daily.      Follow-up Information    Dixie Dials, MD. Schedule an appointment as soon as possible for a visit in 1 week(s).   Specialty:  Cardiology Contact information: [place] [address] [phone]           Signed: [name] 04/15/2018, 2:31 PM

## 2018-04-15 NOTE — Progress Notes (Signed)
Patient is waiting on her ride, her ride is on the way.

## 2018-04-15 NOTE — Progress Notes (Signed)
Renee Griffin to be D/C'd Home per MD order.  Discussed with the patient and all questions fully answered.  VSS, Skin clean, dry and intact without evidence of skin break down, no evidence of skin tears noted. IV catheter discontinued intact. Site without signs and symptoms of complications. Dressing and pressure applied.  An After Visit Summary was printed and given to the patient. Patient received prescription.  D/c education completed with patient/family including follow up instructions, medication list, d/c activities limitations if indicated, with other d/c instructions as indicated by MD - patient able to verbalize understanding, all questions fully answered.   Patient instructed to return to ED, call 911, or call MD for any changes in condition.   Patient escorted via [place] and D/C home via private auto.  Holley Raring 04/15/2018 2:44 PM

## 2018-05-18 ENCOUNTER — Emergency Department (HOSPITAL_COMMUNITY)
Admission: EM | Admit: 2018-05-18 | Discharge: 2018-05-18 | Disposition: A | Discharge status: Home / Self Care | Payer: Medicare Other | Attending: Emergency Medicine | Admitting: Emergency Medicine

## 2018-05-18 ENCOUNTER — Other Ambulatory Visit: Payer: Self-pay

## 2018-05-18 DIAGNOSIS — R630 Anorexia: Secondary | ICD-10-CM | POA: Insufficient documentation

## 2018-05-18 DIAGNOSIS — I10 Essential (primary) hypertension: Secondary | ICD-10-CM | POA: Insufficient documentation

## 2018-05-18 DIAGNOSIS — Z7984 Long term (current) use of oral hypoglycemic drugs: Secondary | ICD-10-CM | POA: Insufficient documentation

## 2018-05-18 DIAGNOSIS — Z79899 Other long term (current) drug therapy: Secondary | ICD-10-CM | POA: Diagnosis not present

## 2018-05-18 DIAGNOSIS — F17210 Nicotine dependence, cigarettes, uncomplicated: Secondary | ICD-10-CM | POA: Diagnosis not present

## 2018-05-18 DIAGNOSIS — R531 Weakness: Secondary | ICD-10-CM | POA: Diagnosis present

## 2018-05-18 DIAGNOSIS — E119 Type 2 diabetes mellitus without complications: Secondary | ICD-10-CM | POA: Insufficient documentation

## 2018-05-18 LAB — COMPREHENSIVE METABOLIC PANEL
ALBUMIN: 3.6 g/dL (ref 3.5–5.0)
ALK PHOS: 148 U/L — AB (ref 38–126)
ALT: 13 U/L — ABNORMAL LOW (ref 14–54)
ANION GAP: 17 — AB (ref 5–15)
AST: 22 U/L (ref 15–41)
BUN: 26 mg/dL — ABNORMAL HIGH (ref 6–20)
CALCIUM: 10.9 mg/dL — AB (ref 8.9–10.3)
CHLORIDE: 105 mmol/L (ref 101–111)
CO2: 19 mmol/L — ABNORMAL LOW (ref 22–32)
Creatinine, Ser: 2 mg/dL — ABNORMAL HIGH (ref 0.44–1.00)
GFR calc non Af Amer: 26 mL/min — ABNORMAL LOW (ref >60)
GFR, EST AFRICAN AMERICAN: 30 mL/min — AB (ref >60)
GLUCOSE: 77 mg/dL (ref 65–99)
POTASSIUM: 3.7 mmol/L (ref 3.5–5.1)
Sodium: 141 mmol/L (ref 135–145)
Total Bilirubin: 0.9 mg/dL (ref 0.3–1.2)
Total Protein: 7.2 g/dL (ref 6.5–8.1)

## 2018-05-18 LAB — I-STAT TROPONIN, ED: Troponin i, poc: 0.04 ng/mL (ref 0.00–0.08)

## 2018-05-18 LAB — URINALYSIS, ROUTINE W REFLEX MICROSCOPIC
Bilirubin Urine: NEGATIVE
GLUCOSE, UA: NEGATIVE mg/dL
HGB URINE DIPSTICK: NEGATIVE
Ketones, ur: 5 mg/dL — AB
NITRITE: NEGATIVE
PH: 5 (ref 5.0–8.0)
Protein, ur: 30 mg/dL — AB
Specific Gravity, Urine: 1.015 (ref 1.005–1.030)

## 2018-05-18 LAB — CBC WITH DIFFERENTIAL/PLATELET
Abs Immature Granulocytes: 0.1 10*3/uL (ref 0.0–0.1)
BASOS ABS: 0.1 10*3/uL (ref 0.0–0.1)
BASOS PCT: 1 %
EOS PCT: 1 %
Eosinophils Absolute: 0.1 10*3/uL (ref 0.0–0.7)
HCT: 35.7 % — ABNORMAL LOW (ref 36.0–46.0)
HEMOGLOBIN: 11.6 g/dL — AB (ref 12.0–15.0)
Immature Granulocytes: 1 %
Lymphocytes Relative: 14 %
Lymphs Abs: 1.3 10*3/uL (ref 0.7–4.0)
MCH: 30.6 pg (ref 26.0–34.0)
MCHC: 32.5 g/dL (ref 30.0–36.0)
MCV: 94.2 fL (ref 78.0–100.0)
MONO ABS: 0.6 10*3/uL (ref 0.1–1.0)
Monocytes Relative: 6 %
NEUTROS ABS: 6.9 10*3/uL (ref 1.7–7.7)
Neutrophils Relative %: 77 %
PLATELETS: 515 10*3/uL — AB (ref 150–400)
RBC: 3.79 MIL/uL — AB (ref 3.87–5.11)
RDW: 15.2 % (ref 11.5–15.5)
WBC: 9.1 10*3/uL (ref 4.0–10.5)

## 2018-05-18 LAB — LIPASE, BLOOD: Lipase: 30 U/L (ref 11–51)

## 2018-05-18 LAB — TSH: TSH: 1.464 u[IU]/mL (ref 0.350–4.500)

## 2018-05-18 MED ORDER — SODIUM CHLORIDE 0.9 % IV BOLUS
1000.0000 mL | Freq: Once | INTRAVENOUS | Status: AC
  Administered 2018-05-18: 1000 mL via INTRAVENOUS

## 2018-05-18 MED ORDER — LOPERAMIDE HCL 2 MG PO CAPS: 2.0000 mg | ORAL_CAPSULE | Freq: Four times a day (QID) | ORAL | 0 refills | Status: DC | PRN

## 2018-05-18 NOTE — ED Provider Notes (Signed)
[facility] EMERGENCY DEPARTMENT Provider Note   CSN: [id number] Arrival date & time: 05/18/18  1003     History   Chief Complaint Chief Complaint  Patient presents with  . Dehydration    HPI Renee Griffin is a 62 y.o. female.  HPI Renee Griffin is a 62 y.o. female with history of diabetes, hypertension, depression, anxiety, history of CVA, presents to emergency department complaining of generalized weakness, anorexia, feeling dehydrated.  Patient states that she has had no appetite for the last 5 days.  She states she has not had any food in the last 4 days and only had water and juice with her medications.  She states that she "just do not feel like eating."  She reports generalized malaise and weakness.  She denies any pain.  Specifically no headache, no chest pain, no abdominal pain.  She denies any nausea or vomiting.  She has chronic diarrhea but states it has not changed.  She denies any urinary symptoms.  No fever or chills.  She states eating food does not cause her to have any pain or discomfort she just does not feel like eating it.  When questioned if she could be feeling depressed, she stated "may be, but I think I am depressed because I feel so bad."  She states "I just want to feel normal."  She states nothing is making her symptoms better or worse.  She states she recently got diagnosed with gastritis.  She denies any medications.  She has no other complaints  Past Medical History:  Diagnosis Date  . Depression   . Diabetes mellitus without complication ([place])   . Heart disease, congenital   . High cholesterol   . Hypertension   . Panic attack   . Stroke [facility])     Patient Active Problem List   Diagnosis Date Noted  . Dehydration 04/13/2018  . Ovarian mass, left   . Abdominal pain 02/01/2018  . Essential hypertension 03/04/2016  . HLD (hyperlipidemia) 03/04/2016  . Type 2 diabetes mellitus with circulatory disorder ([place]) 03/04/2016  . Obesity  03/04/2016  . CVA (cerebral vascular accident) ([place]) 03/05/2015  . Bilateral low back pain without sciatica 03/05/2015  . Essential hypertension, benign 03/05/2015  . Tobacco use disorder 03/05/2015  . Hyperlipidemia 03/05/2015  . Hyperparathyroidism, primary ([place]) 02/13/2013  . Generalized ischemic cerebrovascular disease 02/06/2013  . Hyperreflexia 02/06/2013  . Abnormality of gait 02/06/2013  . Disturbance of skin sensation 02/06/2013  . CVA (cerebral infarction) 06/03/2012  . HTN (hypertension) 06/03/2012  . Dyslipidemia 06/03/2012  . Anxiety 06/03/2012    Past Surgical History:  Procedure Laterality Date  . CARDIAC SURGERY  2011   CABG  . COLONOSCOPY WITH PROPOFOL N/A 02/08/2018   Procedure: COLONOSCOPY WITH PROPOFOL;  Surgeon: Otis Brace, MD;  Location: [facility] ENDOSCOPY;  Service: Gastroenterology;  Laterality: N/A;  . ESOPHAGOGASTRODUODENOSCOPY (EGD) WITH PROPOFOL Left 02/05/2018   Procedure: ESOPHAGOGASTRODUODENOSCOPY (EGD) WITH PROPOFOL;  Surgeon: Otis Brace, MD;  Location: [facility] ENDOSCOPY;  Service: Gastroenterology;  Laterality: Left;  . WISDOM TOOTH EXTRACTION       OB History   None      Home Medications    Prior to Admission medications   Medication Sig Start Date End Date Taking? Authorizing Provider  ACCU-CHEK AVIVA PLUS test strip See admin instructions. 01/29/16   [provider]  ACCU-CHEK SOFTCLIX LANCETS lancets See admin instructions. 01/30/16   [provider]  acetaminophen-codeine (TYLENOL #3) 300-30 MG per tablet  Take 1-2 tablets by mouth daily as needed for moderate pain. Patient taking differently: Take 1 tablet by mouth daily.  03/05/15   Rosalin Hawking, MD  ALPRAZolam Duanne Moron) 0.5 MG tablet Take 0.25 mg by mouth 3 (three) times daily.  01/28/18   [provider]  Blood Glucose Monitoring Suppl (ACCU-CHEK AVIVA PLUS) w/Device KIT See admin instructions. 01/29/16   [provider]  Cholecalciferol (VITAMIN D-3) 1000  UNITS CAPS Take 1,000 Units by mouth daily.    [provider]  diltiazem (CARDIZEM) 90 MG tablet Take 1 tablet (90 mg total) by mouth 2 (two) times daily. 02/09/18   Dixie Dials, MD  furosemide (LASIX) 20 MG tablet Take 1 tablet (20 mg total) by mouth every Monday, Wednesday, and Friday. 04/16/18   Dixie Dials, MD  gemfibrozil (LOPID) 600 MG tablet Take 600 mg by mouth 2 (two) times daily. 02/19/18   [provider]  loperamide (IMODIUM) 2 MG capsule Take 2 mg by mouth daily as needed for diarrhea or loose stools.  02/22/18   [provider]  metFORMIN (GLUCOPHAGE) 500 MG tablet Take 1 tablet (500 mg total) by mouth 2 (two) times daily. 04/15/18   Dixie Dials, MD  Multiple Vitamin (MULTIVITAMIN WITH MINERALS) TABS tablet Take 1 tablet by mouth daily.    [provider]  oxymetazoline (AFRIN) 0.05 % nasal spray Place 1 spray into both nostrils 2 (two) times daily.    [provider]  pantoprazole (PROTONIX) 40 MG tablet Take 1 tablet (40 mg total) by mouth 2 (two) times daily. 02/09/18   Dixie Dials, MD  potassium chloride (K-DUR,KLOR-CON) 10 MEQ tablet Take 10 mEq by mouth daily.    [provider]  prazosin (MINIPRESS) 1 MG capsule Take 1 capsule (1 mg total) by mouth 2 (two) times daily. 02/09/18   Dixie Dials, MD  TURMERIC PO Take 1,440 mg by mouth daily.    [provider]  valACYclovir (VALTREX) 500 MG tablet Take 500 mg by mouth daily.    [provider]    Family History Family History  Problem Relation Age of Onset  . Cancer Mother        uterian OR cervical  . Cancer Father        lung  . Leukemia Brother   . Cancer Sister        breast    Social History Social History   Tobacco Use  . Smoking status: Current Every Day Smoker    Packs/day: 1.00    Years: 50.00    Pack years: 50.00    Types: Cigarettes  . Smokeless tobacco: Never Used  Substance Use Topics  . Alcohol use: No    Alcohol/week: 0.0  oz  . Drug use: No     Allergies   Amoxicillin-pot clavulanate; Bupropion; and Nicotine   Review of Systems Review of Systems  Constitutional: Positive for appetite change and fatigue. Negative for chills and fever.  Respiratory: Negative for cough, chest tightness and shortness of breath.   Cardiovascular: Negative for chest pain, palpitations and leg swelling.  Gastrointestinal: Negative for abdominal pain, diarrhea, nausea and vomiting.  Genitourinary: Negative for dysuria, flank pain, pelvic pain, vaginal bleeding, vaginal discharge and vaginal pain.  Musculoskeletal: Negative for arthralgias, myalgias, neck pain and neck stiffness.  Skin: Negative for rash.  Neurological: Positive for dizziness, weakness and light-headedness. Negative for headaches.  All other systems reviewed and are negative.    Physical Exam Updated Vital Signs BP Marland Kitchen)  159/90 (BP Location: Right Arm)   Pulse 83   Temp 98 F (36.7 C)   Resp 15   Ht '5\' 3"'  (1.6 m)   Wt 68 kg (150 lb)   SpO2 99%   BMI 26.57 kg/m   Physical Exam  Constitutional: She is oriented to person, place, and time. She appears well-developed and well-nourished. No distress.  HENT:  Head: Normocephalic.  Eyes: Conjunctivae are normal.  Neck: Neck supple.  Cardiovascular: Normal rate, regular rhythm and normal heart sounds.  Pulmonary/Chest: Effort normal and breath sounds normal. No respiratory distress. She has no wheezes. She has no rales.  Abdominal: Soft. Bowel sounds are normal. She exhibits no distension. There is no tenderness. There is no rebound and no guarding.  Musculoskeletal: She exhibits no edema.  Neurological: She is alert and oriented to person, place, and time.  Skin: Skin is warm and dry.  Psychiatric: She has a normal mood and affect. Her behavior is normal.  Nursing note and vitals reviewed.    ED Treatments / Results  Labs (all labs ordered are listed, but only abnormal results are displayed) Labs  Reviewed  CBC WITH DIFFERENTIAL/PLATELET - Abnormal; Notable for the following components:      Result Value   RBC 3.79 (*)    Hemoglobin 11.6 (*)    HCT 35.7 (*)    Platelets 515 (*)    All other components within normal limits  COMPREHENSIVE METABOLIC PANEL - Abnormal; Notable for the following components:   CO2 19 (*)    BUN 26 (*)    Creatinine, Ser 2.00 (*)    Calcium 10.9 (*)    ALT 13 (*)    Alkaline Phosphatase 148 (*)    GFR calc non Af Amer 26 (*)    GFR calc Af Amer 30 (*)    Anion gap 17 (*)    All other components within normal limits  LIPASE, BLOOD  TSH  URINALYSIS, ROUTINE W REFLEX MICROSCOPIC  I-STAT TROPONIN, ED    EKG EKG Interpretation  Date/Time:  Friday May 18 2018 10:07:50 EDT Ventricular Rate:  86 PR Interval:    QRS Duration: 82 QT Interval:  458 QTC Calculation: 458 R Axis:   42 Text Interpretation:  Sinus rhythm Atrial premature complexes in couplets Probable anteroseptal infarct, old Minimal ST depression, lateral leads Since last tracing rate slower Confirmed by Orlie Dakin [phone]) on 05/18/2018 10:11:08 AM   Radiology No results found.  Procedures Procedures (including critical care time)  Medications Ordered in ED Medications  sodium chloride 0.9 % bolus 1,000 mL (has no administration in time range)     Initial Impression / Assessment and Plan / ED Course  I have reviewed the triage vital signs and the nursing notes.  Pertinent labs & imaging results that were available during my care of the patient were reviewed by me and considered in my medical decision making (see chart for details).     Patient coming to emergency department complaining of generalized weakness and anorexia.  She feels like she could be dehydrated.  I will give her saline bolus, will check lab work, electrolytes, EKG and troponin given nonspecific complaints.  We will also check TSH.  Will monitor.  She is in no pain.  Vital signs are normal other than  mild hypertension  12:32 PM Patient's labs are grossly unremarkable.  Her creatinine is just a tiny bit up from baseline. She was hydrated with 2L of IV fluids here. She did  now state that her diarrhea is worse than normal and that she ran out of imodium and would like a prescription for the same. Pt appears to be very angry, has flat affect, irritable. I question whether pt's depression could be causing her symptoms, and when asked she denied. At this time, her VS unremarkable other than elevated BP, which has been up and down during visit here. Will have her recheck by PCP next week. Otherwise she is stable for dc home. Encouraged to stay hydrated. Follow up with pcp. Return precautions discussed.   Vitals:   05/18/18 1009 05/18/18 1010 05/18/18 1115 05/18/18 1130  BP: (!) 159/90  140/82 (!) 185/86  Pulse: 83  71 74  Resp: '15  16 15  ' Temp: 98 F (36.7 C)     SpO2: 99%  100% 99%  Weight:  68 kg (150 lb)    Height:  '5\' 3"'  (1.6 m)       Final Clinical Impressions(s) / ED Diagnoses   Final diagnoses:  Anorexia    ED Discharge Orders    None       Jeannett Senior, PA-C 05/18/18 1237    Orlie Dakin, MD 05/18/18 1640

## 2018-05-18 NOTE — ED Provider Notes (Signed)
Reports diminished appetite for the past 4 days. She denies pain anywhere denies fever`. She does admit to a proximally 10 episodes of nonbloody diarrhea over the past 4 days. She states that she ran out of Imodium. No recent travel or antibiotics. On exam no distress lungs clear auscultation heart regular rate and rhythm abdomen soft nontender   Orlie Dakin, MD 05/18/18 1225

## 2018-05-18 NOTE — Discharge Instructions (Signed)
Drink 6 x 8oz glasses of water a day.  Try to supplement to meals with protein shakes or Ensure.  Your blood pressure slightly elevated here today, please follow-up with your family doctor next week for recheck of your anorexia as well as your blood pressure.  Return if worsening symptoms.  Take Imodium as needed.

## 2018-05-18 NOTE — ED Triage Notes (Signed)
Patient tells EMS she has "lost the will to eat/drink since last Monday. Per patient she urinated this AM with no pain/discomfort and has been taking her medicine all week, drinking fluids for that.

## 2018-12-16 ENCOUNTER — Encounter (HOSPITAL_COMMUNITY)
Admission: EM | Disposition: A | Discharge status: Home / Self Care | Payer: Self-pay | Source: Home / Self Care | Attending: Cardiology

## 2018-12-16 ENCOUNTER — Inpatient Hospital Stay (HOSPITAL_COMMUNITY): Payer: Medicare Other | Admitting: Certified Registered Nurse Anesthetist

## 2018-12-16 ENCOUNTER — Inpatient Hospital Stay (HOSPITAL_COMMUNITY)
Admission: EM | Admit: 2018-12-16 | Discharge: 2018-12-25 | DRG: 907 | Disposition: A | Discharge status: Home / Self Care | Payer: Medicare Other | Attending: Cardiovascular Disease | Admitting: Cardiovascular Disease

## 2018-12-16 ENCOUNTER — Encounter (HOSPITAL_COMMUNITY): Payer: Self-pay | Admitting: Nurse Practitioner

## 2018-12-16 DIAGNOSIS — F410 Panic disorder [episodic paroxysmal anxiety] without agoraphobia: Secondary | ICD-10-CM | POA: Diagnosis present

## 2018-12-16 DIAGNOSIS — K921 Melena: Secondary | ICD-10-CM | POA: Diagnosis present

## 2018-12-16 DIAGNOSIS — I129 Hypertensive chronic kidney disease with stage 1 through stage 4 chronic kidney disease, or unspecified chronic kidney disease: Secondary | ICD-10-CM | POA: Diagnosis present

## 2018-12-16 DIAGNOSIS — Z8673 Personal history of transient ischemic attack (TIA), and cerebral infarction without residual deficits: Secondary | ICD-10-CM

## 2018-12-16 DIAGNOSIS — K91840 Postprocedural hemorrhage and hematoma of a digestive system organ or structure following a digestive system procedure: Secondary | ICD-10-CM | POA: Diagnosis present

## 2018-12-16 DIAGNOSIS — Z953 Presence of xenogenic heart valve: Secondary | ICD-10-CM

## 2018-12-16 DIAGNOSIS — E7800 Pure hypercholesterolemia, unspecified: Secondary | ICD-10-CM | POA: Diagnosis present

## 2018-12-16 DIAGNOSIS — Z888 Allergy status to other drugs, medicaments and biological substances status: Secondary | ICD-10-CM

## 2018-12-16 DIAGNOSIS — M545 Low back pain: Secondary | ICD-10-CM | POA: Diagnosis present

## 2018-12-16 DIAGNOSIS — D62 Acute posthemorrhagic anemia: Secondary | ICD-10-CM | POA: Diagnosis present

## 2018-12-16 DIAGNOSIS — F329 Major depressive disorder, single episode, unspecified: Secondary | ICD-10-CM | POA: Diagnosis present

## 2018-12-16 DIAGNOSIS — R570 Cardiogenic shock: Secondary | ICD-10-CM | POA: Diagnosis present

## 2018-12-16 DIAGNOSIS — N183 Chronic kidney disease, stage 3 (moderate): Secondary | ICD-10-CM | POA: Diagnosis present

## 2018-12-16 DIAGNOSIS — Z806 Family history of leukemia: Secondary | ICD-10-CM | POA: Diagnosis not present

## 2018-12-16 DIAGNOSIS — D3502 Benign neoplasm of left adrenal gland: Secondary | ICD-10-CM | POA: Diagnosis present

## 2018-12-16 DIAGNOSIS — K219 Gastro-esophageal reflux disease without esophagitis: Secondary | ICD-10-CM | POA: Diagnosis present

## 2018-12-16 DIAGNOSIS — I959 Hypotension, unspecified: Secondary | ICD-10-CM | POA: Diagnosis present

## 2018-12-16 DIAGNOSIS — E1122 Type 2 diabetes mellitus with diabetic chronic kidney disease: Secondary | ICD-10-CM | POA: Diagnosis present

## 2018-12-16 DIAGNOSIS — E785 Hyperlipidemia, unspecified: Secondary | ICD-10-CM | POA: Diagnosis present

## 2018-12-16 DIAGNOSIS — Z8719 Personal history of other diseases of the digestive system: Secondary | ICD-10-CM

## 2018-12-16 DIAGNOSIS — K922 Gastrointestinal hemorrhage, unspecified: Secondary | ICD-10-CM

## 2018-12-16 DIAGNOSIS — Z7902 Long term (current) use of antithrombotics/antiplatelets: Secondary | ICD-10-CM

## 2018-12-16 DIAGNOSIS — I2510 Atherosclerotic heart disease of native coronary artery without angina pectoris: Secondary | ICD-10-CM | POA: Diagnosis present

## 2018-12-16 DIAGNOSIS — Z880 Allergy status to penicillin: Secondary | ICD-10-CM

## 2018-12-16 DIAGNOSIS — K5730 Diverticulosis of large intestine without perforation or abscess without bleeding: Secondary | ICD-10-CM | POA: Diagnosis present

## 2018-12-16 DIAGNOSIS — Z951 Presence of aortocoronary bypass graft: Secondary | ICD-10-CM

## 2018-12-16 DIAGNOSIS — F17210 Nicotine dependence, cigarettes, uncomplicated: Secondary | ICD-10-CM | POA: Diagnosis present

## 2018-12-16 DIAGNOSIS — G8929 Other chronic pain: Secondary | ICD-10-CM | POA: Diagnosis present

## 2018-12-16 DIAGNOSIS — D3501 Benign neoplasm of right adrenal gland: Secondary | ICD-10-CM | POA: Diagnosis present

## 2018-12-16 DIAGNOSIS — R58 Hemorrhage, not elsewhere classified: Secondary | ICD-10-CM

## 2018-12-16 DIAGNOSIS — K625 Hemorrhage of anus and rectum: Secondary | ICD-10-CM | POA: Diagnosis present

## 2018-12-16 HISTORY — PX: COLONOSCOPY WITH PROPOFOL: SHX5780

## 2018-12-16 LAB — COMPREHENSIVE METABOLIC PANEL
ALBUMIN: 2.9 g/dL — AB (ref 3.5–5.0)
ALT: 9 U/L (ref 0–44)
AST: 13 U/L — ABNORMAL LOW (ref 15–41)
Alkaline Phosphatase: 150 U/L — ABNORMAL HIGH (ref 38–126)
Anion gap: 13 (ref 5–15)
BILIRUBIN TOTAL: 0.5 mg/dL (ref 0.3–1.2)
BUN: 25 mg/dL — AB (ref 8–23)
CHLORIDE: 117 mmol/L — AB (ref 98–111)
CO2: 17 mmol/L — ABNORMAL LOW (ref 22–32)
Calcium: 9 mg/dL (ref 8.9–10.3)
Creatinine, Ser: 1.79 mg/dL — ABNORMAL HIGH (ref 0.44–1.00)
GFR calc Af Amer: 34 mL/min — ABNORMAL LOW (ref >60)
GFR calc non Af Amer: 30 mL/min — ABNORMAL LOW (ref >60)
GLUCOSE: 125 mg/dL — AB (ref 70–99)
POTASSIUM: 4.2 mmol/L (ref 3.5–5.1)
Sodium: 147 mmol/L — ABNORMAL HIGH (ref 135–145)
Total Protein: 5.7 g/dL — ABNORMAL LOW (ref 6.5–8.1)

## 2018-12-16 LAB — CBC WITH DIFFERENTIAL/PLATELET
ABS IMMATURE GRANULOCYTES: 0.12 10*3/uL — AB (ref 0.00–0.07)
Basophils Absolute: 0.1 10*3/uL (ref 0.0–0.1)
Basophils Relative: 1 %
Eosinophils Absolute: 0.2 10*3/uL (ref 0.0–0.5)
Eosinophils Relative: 2 %
HEMATOCRIT: 23.5 % — AB (ref 36.0–46.0)
HEMOGLOBIN: 7.1 g/dL — AB (ref 12.0–15.0)
Immature Granulocytes: 1 %
LYMPHS PCT: 11 %
Lymphs Abs: 1.3 10*3/uL (ref 0.7–4.0)
MCH: 30 pg (ref 26.0–34.0)
MCHC: 30.2 g/dL (ref 30.0–36.0)
MCV: 99.2 fL (ref 80.0–100.0)
MONOS PCT: 6 %
Monocytes Absolute: 0.6 10*3/uL (ref 0.1–1.0)
NEUTROS ABS: 9.2 10*3/uL — AB (ref 1.7–7.7)
Neutrophils Relative %: 79 %
Platelets: 450 10*3/uL — ABNORMAL HIGH (ref 150–400)
RBC: 2.37 MIL/uL — ABNORMAL LOW (ref 3.87–5.11)
RDW: 15.1 % (ref 11.5–15.5)
WBC: 11.5 10*3/uL — ABNORMAL HIGH (ref 4.0–10.5)
nRBC: 0 % (ref 0.0–0.2)

## 2018-12-16 LAB — I-STAT CHEM 8, ED
BUN: 25 mg/dL — ABNORMAL HIGH (ref 8–23)
CALCIUM ION: 1.23 mmol/L (ref 1.15–1.40)
Chloride: 117 mmol/L — ABNORMAL HIGH (ref 98–111)
Creatinine, Ser: 1.8 mg/dL — ABNORMAL HIGH (ref 0.44–1.00)
Glucose, Bld: 119 mg/dL — ABNORMAL HIGH (ref 70–99)
HEMATOCRIT: 20 % — AB (ref 36.0–46.0)
Hemoglobin: 6.8 g/dL — CL (ref 12.0–15.0)
Potassium: 4.2 mmol/L (ref 3.5–5.1)
Sodium: 143 mmol/L (ref 135–145)
TCO2: 20 mmol/L — AB (ref 22–32)

## 2018-12-16 LAB — GLUCOSE, CAPILLARY
Glucose-Capillary: 114 mg/dL — ABNORMAL HIGH (ref 70–99)
Glucose-Capillary: 109 mg/dL — ABNORMAL HIGH (ref 70–99)

## 2018-12-16 LAB — HEMOGLOBIN AND HEMATOCRIT, BLOOD
HCT: 27.3 % — ABNORMAL LOW (ref 36.0–46.0)
Hemoglobin: 8.6 g/dL — ABNORMAL LOW (ref 12.0–15.0)

## 2018-12-16 LAB — PREPARE RBC (CROSSMATCH)

## 2018-12-16 LAB — PROTIME-INR
INR: 1.09
PROTHROMBIN TIME: 14 s (ref 11.4–15.2)

## 2018-12-16 LAB — MRSA PCR SCREENING: MRSA by PCR: NEGATIVE

## 2018-12-16 LAB — ABO/RH: ABO/RH(D): O POS

## 2018-12-16 SURGERY — COLONOSCOPY WITH PROPOFOL
Anesthesia: Monitor Anesthesia Care

## 2018-12-16 MED ORDER — SODIUM CHLORIDE 0.9 % IV SOLN: INTRAVENOUS | Status: DC

## 2018-12-16 MED ORDER — PEG 3350-KCL-NA BICARB-NACL 420 G PO SOLR
4000.0000 mL | Freq: Once | ORAL | Status: AC
  Administered 2018-12-16: 4000 mL via ORAL
  Filled 2018-12-16: qty 4000

## 2018-12-16 MED ORDER — PANTOPRAZOLE SODIUM 40 MG IV SOLR
40.0000 mg | INTRAVENOUS | Status: DC
  Administered 2018-12-17 – 2018-12-24 (×8): 40 mg via INTRAVENOUS
  Filled 2018-12-16 (×8): qty 40

## 2018-12-16 MED ORDER — PROPOFOL 10 MG/ML IV BOLUS
INTRAVENOUS | Status: AC
  Filled 2018-12-16: qty 60

## 2018-12-16 MED ORDER — INSULIN ASPART 100 UNIT/ML ~~LOC~~ SOLN
0.0000 [IU] | Freq: Three times a day (TID) | SUBCUTANEOUS | Status: DC
  Administered 2018-12-20 – 2018-12-24 (×3): 1 [IU] via SUBCUTANEOUS

## 2018-12-16 MED ORDER — SODIUM CHLORIDE 0.9 % IV SOLN
INTRAVENOUS | Status: DC
  Administered 2018-12-16 – 2018-12-21 (×5): via INTRAVENOUS

## 2018-12-16 MED ORDER — SODIUM CHLORIDE 0.9 % IV BOLUS
500.0000 mL | Freq: Once | INTRAVENOUS | Status: AC
  Administered 2018-12-16: 500 mL via INTRAVENOUS

## 2018-12-16 MED ORDER — SODIUM CHLORIDE 0.9 % IV SOLN
10.0000 mL/h | Freq: Once | INTRAVENOUS | Status: DC
Start: 2018-12-16 — End: 2018-12-23

## 2018-12-16 MED ORDER — PROPOFOL 10 MG/ML IV BOLUS
INTRAVENOUS | Status: DC | PRN
  Administered 2018-12-16 (×3): 40 mg via INTRAVENOUS

## 2018-12-16 MED ORDER — SODIUM CHLORIDE 0.9% FLUSH
3.0000 mL | Freq: Two times a day (BID) | INTRAVENOUS | Status: DC
  Administered 2018-12-16 – 2018-12-25 (×13): 3 mL via INTRAVENOUS

## 2018-12-16 MED ORDER — PEG 3350-KCL-NA BICARB-NACL 420 G PO SOLR
4000.0000 mL | Freq: Once | ORAL | Status: DC
  Filled 2018-12-16: qty 4000

## 2018-12-16 MED ORDER — LACTATED RINGERS IV SOLN
INTRAVENOUS | Status: AC | PRN
  Administered 2018-12-16: 1000 mL via INTRAVENOUS

## 2018-12-16 MED ORDER — PROPOFOL 500 MG/50ML IV EMUL
INTRAVENOUS | Status: DC | PRN
  Administered 2018-12-16: 150 ug/kg/min via INTRAVENOUS

## 2018-12-16 MED ORDER — PHENYLEPHRINE 40 MCG/ML (10ML) SYRINGE FOR IV PUSH (FOR BLOOD PRESSURE SUPPORT)
PREFILLED_SYRINGE | INTRAVENOUS | Status: DC | PRN
  Administered 2018-12-16: 80 ug via INTRAVENOUS

## 2018-12-16 MED ORDER — FENTANYL CITRATE (PF) 250 MCG/5ML IJ SOLN
INTRAMUSCULAR | Status: AC
  Filled 2018-12-16: qty 5

## 2018-12-16 MED ORDER — LINACLOTIDE 145 MCG PO CAPS
145.0000 ug | ORAL_CAPSULE | Freq: Once | ORAL | Status: AC
  Administered 2018-12-16: 145 ug via ORAL
  Filled 2018-12-16: qty 1

## 2018-12-16 MED ORDER — FENTANYL CITRATE (PF) 250 MCG/5ML IJ SOLN
INTRAMUSCULAR | Status: DC | PRN
  Administered 2018-12-16: 25 ug via INTRAVENOUS

## 2018-12-16 SURGICAL SUPPLY — 21 items

## 2018-12-16 NOTE — Progress Notes (Signed)
Pt was lying in bed when I arrived. She was awake and said she was okay and did need anything. She thanked Clinical biochemist for checking. Please page if additional assistance is needed. [place], MDiv   12/16/18 1500  Clinical Encounter Type  Visited With Patient

## 2018-12-16 NOTE — Anesthesia Procedure Notes (Signed)
Date/Time: 12/16/2018 6:00 PM Performed by: Claudia Desanctis, CRNA Pre-anesthesia Checklist: Patient identified, Emergency Drugs available, Suction available and Patient being monitored Oxygen Delivery Method: Simple face mask

## 2018-12-16 NOTE — ED Provider Notes (Signed)
[facility] DEPT Provider Note   CSN: [id number] Arrival date & time: 12/16/18  1334     History   Chief Complaint Chief Complaint  Patient presents with  . Rectal Bleeding    HPI Renee Griffin is a 62 y.o. female.  HPI  62 year old female presents with rectal bleeding.  She states she had a colonoscopy by [facility] 10 days ago.  She states she thinks it was for polyps and a polyp was removed.  Since then she is been having on and off diarrhea and when he has diarrhea there is some blood.  However this is about once per day.  This does not occur every day.  Today, however, she had a large amount of bleeding when she had a bowel movement.  She called EMS and was brought here.  Blood pressures were soft in the 90s.  She is not on any blood thinners.  She denies any chest pain, shortness of breath, dizziness, or abdominal pain.  Past Medical History:  Diagnosis Date  . Depression   . Diabetes mellitus without complication ([place])   . Heart disease, congenital   . High cholesterol   . Hypertension   . Panic attack   . Stroke [facility])     Patient Active Problem List   Diagnosis Date Noted  . Dehydration 04/13/2018  . Ovarian mass, left   . Abdominal pain 02/01/2018  . Essential hypertension 03/04/2016  . HLD (hyperlipidemia) 03/04/2016  . Type 2 diabetes mellitus with circulatory disorder ([place]) 03/04/2016  . Obesity 03/04/2016  . CVA (cerebral vascular accident) ([place]) 03/05/2015  . Bilateral low back pain without sciatica 03/05/2015  . Essential hypertension, benign 03/05/2015  . Tobacco use disorder 03/05/2015  . Hyperlipidemia 03/05/2015  . Hyperparathyroidism, primary ([place]) 02/13/2013  . Generalized ischemic cerebrovascular disease 02/06/2013  . Hyperreflexia 02/06/2013  . Abnormality of gait 02/06/2013  . Disturbance of skin sensation 02/06/2013  . CVA (cerebral infarction) 06/03/2012  . HTN (hypertension) 06/03/2012  . Dyslipidemia  06/03/2012  . Anxiety 06/03/2012    Past Surgical History:  Procedure Laterality Date  . CARDIAC SURGERY  2011   CABG  . COLONOSCOPY WITH PROPOFOL N/A 02/08/2018   Procedure: COLONOSCOPY WITH PROPOFOL;  Surgeon: Otis Brace, MD;  Location: [facility];  Service: Gastroenterology;  Laterality: N/A;  . ESOPHAGOGASTRODUODENOSCOPY (EGD) WITH PROPOFOL Left 02/05/2018   Procedure: ESOPHAGOGASTRODUODENOSCOPY (EGD) WITH PROPOFOL;  Surgeon: Otis Brace, MD;  [facility];  Service: Gastroenterology;  Laterality: Left;  . WISDOM TOOTH EXTRACTION       OB History   No obstetric history on file.      Home Medications    Prior to Admission medications   Medication Sig Start Date End Date Taking? Authorizing Provider  ACCU-CHEK AVIVA PLUS test strip See admin instructions. 01/29/16   [provider]  ACCU-CHEK SOFTCLIX LANCETS lancets See admin instructions. 01/30/16   [provider]  acetaminophen-codeine (TYLENOL #3) 300-30 MG per tablet Take 1-2 tablets by mouth daily as needed for moderate pain. Patient taking differently: Take 1 tablet by mouth daily.  03/05/15   Rosalin Hawking, MD  ALPRAZolam Duanne Moron) 0.5 MG tablet Take 0.25 mg by mouth 3 (three) times daily.  01/28/18   [provider]  Blood Glucose Monitoring Suppl (ACCU-CHEK AVIVA PLUS) w/Device KIT See admin instructions. 01/29/16   [provider]  Cholecalciferol (VITAMIN D-3) 1000 UNITS CAPS Take 1,000 Units by mouth daily.    [provider]  diltiazem (  CARDIZEM) 90 MG tablet Take 1 tablet (90 mg total) by mouth 2 (two) times daily. 02/09/18   Dixie Dials, MD  furosemide (LASIX) 20 MG tablet Take 1 tablet (20 mg total) by mouth every Monday, Wednesday, and Friday. 04/16/18   Dixie Dials, MD  gemfibrozil (LOPID) 600 MG tablet Take 600 mg by mouth 2 (two) times daily. 02/19/18   [provider]  loperamide (IMODIUM) 2 MG capsule Take 2 mg by mouth daily as needed for  diarrhea or loose stools.  02/22/18   [provider]  loperamide (IMODIUM) 2 MG capsule Take 1 capsule (2 mg total) by mouth 4 (four) times daily as needed for diarrhea or loose stools. 05/18/18   Kirichenko, Lahoma Rocker, PA-C  metFORMIN (GLUCOPHAGE) 500 MG tablet Take 1 tablet (500 mg total) by mouth 2 (two) times daily. 04/15/18   Dixie Dials, MD  Multiple Vitamin (MULTIVITAMIN WITH MINERALS) TABS tablet Take 1 tablet by mouth daily.    [provider]  oxymetazoline (AFRIN) 0.05 % nasal spray Place 1 spray into both nostrils 2 (two) times daily.    [provider]  pantoprazole (PROTONIX) 40 MG tablet Take 1 tablet (40 mg total) by mouth 2 (two) times daily. 02/09/18   Dixie Dials, MD  potassium chloride (K-DUR,KLOR-CON) 10 MEQ tablet Take 10 mEq by mouth daily.    [provider]  prazosin (MINIPRESS) 1 MG capsule Take 1 capsule (1 mg total) by mouth 2 (two) times daily. 02/09/18   Dixie Dials, MD  TURMERIC PO Take 1,440 mg by mouth daily.    [provider]  valACYclovir (VALTREX) 500 MG tablet Take 500 mg by mouth daily.    [provider]    Family History Family History  Problem Relation Age of Onset  . Cancer Mother        uterian OR cervical  . Cancer Father        lung  . Leukemia Brother   . Cancer Sister        breast    Social History Social History   Tobacco Use  . Smoking status: Current Every Day Smoker    Packs/day: 1.00    Years: 50.00    Pack years: 50.00    Types: Cigarettes  . Smokeless tobacco: Never Used  Substance Use Topics  . Alcohol use: No    Alcohol/week: 0.0 standard drinks  . Drug use: No     Allergies   Amoxicillin-pot clavulanate; Bupropion; and Nicotine   Review of Systems Review of Systems  Respiratory: Negative for shortness of breath.   Cardiovascular: Negative for chest pain.  Gastrointestinal: Positive for blood in stool. Negative for abdominal pain, nausea and vomiting.    Neurological: Negative for dizziness.  All other systems reviewed and are negative.    Physical Exam Updated Vital Signs BP (!) 106/53   Pulse 80   Temp 97.6 F (36.4 C) (Axillary)   Resp 16   Ht _0  (1.6 m)   Wt 72.6 kg   SpO2 100%   BMI 28.34 kg/m   Physical Exam Vitals signs and nursing note reviewed.  Constitutional:      Appearance: She is well-developed. She is ill-appearing.  HENT:     Head: Normocephalic and atraumatic.     Right Ear: External ear normal.     Left Ear: External ear normal.     Nose: Nose normal.  Eyes:     General:  Right eye: No discharge.        Left eye: No discharge.  Cardiovascular:     Rate and Rhythm: Normal rate and regular rhythm.     Heart sounds: Normal heart sounds.  Pulmonary:     Effort: Pulmonary effort is normal.     Breath sounds: Normal breath sounds.  Abdominal:     General: There is no distension.     Palpations: Abdomen is soft.     Tenderness: There is no abdominal tenderness.  Genitourinary:    Comments: Large amount of rectal bleeding with clots while I was in the room Skin:    General: Skin is warm and dry.     Coloration: Skin is pale.  Neurological:     Mental Status: She is alert.  Psychiatric:        Mood and Affect: Mood is not anxious.      ED Treatments / Results  Labs (all labs ordered are listed, but only abnormal results are displayed) Labs Reviewed  CBC WITH DIFFERENTIAL/PLATELET - Abnormal; Notable for the following components:      Result Value   WBC 11.5 (*)    RBC 2.37 (*)    Hemoglobin 7.1 (*)    HCT 23.5 (*)    Platelets 450 (*)    Neutro Abs 9.2 (*)    Abs Immature Granulocytes 0.12 (*)    All other components within normal limits  I-STAT CHEM 8, ED - Abnormal; Notable for the following components:   Chloride 117 (*)    BUN 25 (*)    Creatinine, Ser 1.80 (*)    Glucose, Bld 119 (*)    TCO2 20 (*)    Hemoglobin 6.8 (*)    HCT 20.0 (*)    All other components within  normal limits  COMPREHENSIVE METABOLIC PANEL  PROTIME-INR  TYPE AND SCREEN  PREPARE RBC (CROSSMATCH)  ABO/RH    EKG EKG Interpretation  Date/Time:  Sunday December 16 2018 13:51:51 EST Ventricular Rate:  85 PR Interval:    QRS Duration: 82 QT Interval:  404 QTC Calculation: 481 R Axis:   29 Text Interpretation:  Sinus rhythm Anteroseptal infarct, old similar to May 2019 Confirmed by Sherwood Gambler [phone]) on 12/16/2018 3:03:38 PM   Radiology No results found.  Procedures .Critical Care Performed by: Sherwood Gambler, MD Authorized by: Sherwood Gambler, MD   Critical care provider statement:    Critical care time (minutes):  40   Critical care time was exclusive of:  Separately billable procedures and treating other patients   Critical care was necessary to treat or prevent imminent or life-threatening deterioration of the following conditions:  Circulatory failure   Critical care was time spent personally by me on the following activities:  Development of treatment plan with patient or surrogate, discussions with consultants, evaluation of patient's response to treatment, examination of patient, obtaining history from patient or surrogate, ordering and performing treatments and interventions, ordering and review of laboratory studies, ordering and review of radiographic studies, pulse oximetry, re-evaluation of patient's condition and review of old charts   (including critical care time)  Medications Ordered in ED Medications  0.9 %  sodium chloride infusion (has no administration in time range)  sodium chloride 0.9 % bolus 500 mL ( Intravenous Stopped 12/16/18 1519)     Initial Impression / Assessment and Plan / ED Course  I have reviewed the triage vital signs and the nursing notes.  Pertinent labs & imaging results that were  available during my care of the patient were reviewed by me and considered in my medical decision making (see chart for details).     Patient  presents with significant GI bleeding.  Likely this is from the polyps removed.  She initially tells me that there are no blood thinners she takes but after GI consultation she tells them she takes Plavix which she has been taking since the colonoscopy.  I did discuss her case with Dr. Cristina Gong of [facility] who has evaluated patient and will scope tonight.  He would like for her to get the blood transfusion first which is currently pending.  She has been ordered 2 units of blood. Dr Terrence Dupont has been consulted and will admit.  Final Clinical Impressions(s) / ED Diagnoses   Final diagnoses:  Acute GI bleeding  Acute blood loss anemia    ED Discharge Orders    None       Sherwood Gambler, MD 12/16/18 1536

## 2018-12-16 NOTE — Anesthesia Preprocedure Evaluation (Signed)
Anesthesia Evaluation  Patient identified by MRN, date of birth, ID band Patient awake    Reviewed: Allergy & Precautions, NPO status , Patient's Chart, lab work & pertinent test results, Unable to perform ROS - Chart review only  Airway Mallampati: I  TM Distance: >3 FB Neck ROM: Full    Dental  (+) Teeth Intact   Pulmonary Current Smoker,    Pulmonary exam normal breath sounds clear to auscultation       Cardiovascular hypertension, Pt. on medications Normal cardiovascular exam     Neuro/Psych PSYCHIATRIC DISORDERS Anxiety Depression CVA    GI/Hepatic GERD  Medicated,  Endo/Other  diabetes, Type 2, Oral Hypoglycemic Agents  Renal/GU   negative genitourinary   Musculoskeletal   Abdominal Normal abdominal exam  (+)   Peds  Hematology  (+) anemia ,   Anesthesia Other Findings   Reproductive/Obstetrics                             Anesthesia Physical  Anesthesia Plan  ASA: III  Anesthesia Plan: MAC   Post-op Pain Management:    Induction: Intravenous  PONV Risk Score and Plan: 1 and Ondansetron and Treatment may vary due to age or medical condition  Airway Management Planned: Simple Face Mask and Natural Airway  Additional Equipment:   Intra-op Plan:   Post-operative Plan:   Informed Consent: I have reviewed the patients History and Physical, chart, labs and discussed the procedure including the risks, benefits and alternatives for the proposed anesthesia with the patient or authorized representative who has indicated his/her understanding and acceptance.     Plan Discussed with: CRNA  Anesthesia Plan Comments:         Anesthesia Quick Evaluation

## 2018-12-16 NOTE — Transfer of Care (Signed)
Immediate Anesthesia Transfer of Care Note  Patient: Renee Griffin  Procedure(s) Performed: Procedure(s): COLONOSCOPY WITH PROPOFOL (N/A)  Patient Location: PACU  Anesthesia Type:MAC  Level of Consciousness:  sedated, patient cooperative and responds to stimulation  Airway & Oxygen Therapy:Patient Spontanous Breathing and Patient connected to face mask oxgen  Post-op Assessment:  Report given to PACU RN and Post -op Vital signs reviewed and stable  Post vital signs:  Reviewed and stable  Last Vitals:  Vitals:   12/16/18 1855 12/16/18 1943  BP: (!) 167/44   Pulse: 72   Resp: 16   Temp:  36.4 C  SpO2: 607%     Complications: No apparent anesthesia complications

## 2018-12-16 NOTE — ED Notes (Signed)
Bed: WA24 Expected date:  Expected time:  Means of arrival:  Comments: Rectal bleed 

## 2018-12-16 NOTE — Progress Notes (Signed)
Patient tolerated colonoscopy well.  Please see dictated procedure report.  Main finding was the absence of active bleeding, but the presence of a very large amount of clotted blood scattered throughout the mid colon and left colon.  In the mid transverse colon region, there appeared to be a large fresh red clot, which I think corresponds to the site of bleeding.  I was unable to dislodge the clot to expose the (presumed) polypectomy site underneath it, therefore, did not perform any intervention such as injection or clipping.  Plan:  1.  Monitor hemoglobin 2.  Initiate gentle prep with Linzess and Nulytely to try to purge out the old blood.  (Ordered)  This will also serve to prepare the colon for colonoscopy in the event that there is recurrent bleeding, with need for colonoscopic intervention.  If you have any questions, our on-call physician can be reached anytime at [phone].  Cleotis Nipper, M.D. Pager [phone] If no answer or after 5 PM call [phone]

## 2018-12-16 NOTE — ED Triage Notes (Signed)
Pt is presented from by EMS, report of an ongoing rectal bleed, bright red blood on scene, pt reports sudden onset this morning with associated weakness and dizziness, pt reports she had a colonoscopy 10 days ago,notable medics vitals include a hypotensive BP of 92/53 w/o tachycardia.

## 2018-12-16 NOTE — H&P (Signed)
Renee Griffin is an 62 y.o. female.   Chief Complaint: Bright red blood per rectum since this a.m. HPI: Patient is 62 year old female with past medical history significant for coronary artery disease status post CABG/AVR with bioprosthetic valve, hypertension, diabetes mellitus, hyperlipidemia, history of CVA in 2009 walks with a cane, tobacco abuse smokes 1 pack/day for 40+ years continues to smoke, history of colonic polyps, depression, chronic kidney disease stage II came to ER by EMS because of large amount of bright red blood per rectum which started this a.m. and also had few large BMs in the ED was noted to have hemoglobin of 7 which was about 4-1/2 g lower than prior hemoglobin.  Patient states she had colonoscopy and multiple polyps were removed on December 12 and subsequently had small amount of bleeding next day which resolved.  Patient also complains of chronic lower back pain states has taken Advil to 3 days ago and has been on Plavix which was started after the procedure.  Denies abdominal pain nausea vomiting diarrhea.  Patient presently receiving blood transfusion.  Patient denies any chest pain or shortness of breath.  Denies palpitations, dizziness lightheadedness or syncope.   Past Medical History:  Diagnosis Date  . Depression   . Diabetes mellitus without complication ([place])   . Heart disease, congenital   . High cholesterol   . Hypertension   . Panic attack   . Stroke [facility])     Past Surgical History:  Procedure Laterality Date  . CARDIAC SURGERY  2011   CABG  . COLONOSCOPY WITH PROPOFOL N/A 02/08/2018   Procedure: COLONOSCOPY WITH PROPOFOL;  Surgeon: Otis Brace, MD;  [facility];  Service: Gastroenterology;  Laterality: N/A;  . ESOPHAGOGASTRODUODENOSCOPY (EGD) WITH PROPOFOL Left 02/05/2018   Procedure: ESOPHAGOGASTRODUODENOSCOPY (EGD) WITH PROPOFOL;  Surgeon: Otis Brace, MD;  [facility];  Service: Gastroenterology;  Laterality: Left;  .  WISDOM TOOTH EXTRACTION      Family History  Problem Relation Age of Onset  . Cancer Mother        uterian OR cervical  . Cancer Father        lung  . Leukemia Brother   . Cancer Sister        breast   Social History:  reports that she has been smoking cigarettes. She has a 50.00 pack-year smoking history. She has never used smokeless tobacco. She reports that she does not drink alcohol or use drugs.  Allergies:  Allergies  Allergen Reactions  . Amoxicillin-Pot Clavulanate Diarrhea    Has patient had a PCN reaction causing immediate rash, facial/tongue/throat swelling, SOB or lightheadedness with hypotension: Unknown Has patient had a PCN reaction causing severe rash involving mucus membranes or skin necrosis:UnknoWN Has patient had a PCN reaction that required hospitalization: Unknown Has patient had a PCN reaction occurring within the last 10 years: Unknown If all of the above answers are "NO", then may proceed with Cephalosporin use.   . Bupropion Nausea Only  . Nicotine Nausea Only    Used patches only.    (Not in a hospital admission)   Results for orders placed or performed during the hospital encounter of 12/16/18 (from the past 48 hour(s))  I-Stat Chem 8, ED     Status: Abnormal   Collection Time: 12/16/18  2:08 PM  Result Value Ref Range   Sodium 143 135 - 145 mmol/L   Potassium 4.2 3.5 - 5.1 mmol/L   Chloride 117 (H) 98 - 111 mmol/L  BUN 25 (H) 8 - 23 mg/dL   Creatinine, Ser 1.80 (H) 0.44 - 1.00 mg/dL   Glucose, Bld 119 (H) 70 - 99 mg/dL   Calcium, Ion 1.23 1.15 - 1.40 mmol/L   TCO2 20 (L) 22 - 32 mmol/L   Hemoglobin 6.8 (LL) 12.0 - 15.0 g/dL   HCT 20.0 (L) 36.0 - 46.0 %   Comment NOTIFIED PHYSICIAN   Comprehensive metabolic panel     Status: Abnormal   Collection Time: 12/16/18  2:16 PM  Result Value Ref Range   Sodium 147 (H) 135 - 145 mmol/L   Potassium 4.2 3.5 - 5.1 mmol/L   Chloride 117 (H) 98 - 111 mmol/L   CO2 17 (L) 22 - 32 mmol/L   Glucose,  Bld 125 (H) 70 - 99 mg/dL   BUN 25 (H) 8 - 23 mg/dL   Creatinine, Ser 1.79 (H) 0.44 - 1.00 mg/dL   Calcium 9.0 8.9 - 10.3 mg/dL   Total Protein 5.7 (L) 6.5 - 8.1 g/dL   Albumin 2.9 (L) 3.5 - 5.0 g/dL   AST 13 (L) 15 - 41 U/L   ALT 9 0 - 44 U/L   Alkaline Phosphatase 150 (H) 38 - 126 U/L   Total Bilirubin 0.5 0.3 - 1.2 mg/dL   GFR calc non Af Amer 30 (L) >60 mL/min   GFR calc Af Amer 34 (L) >60 mL/min   Anion gap 13 5 - 15    Comment: Performed at [facility], [place] [address] [place], [place] [address]  CBC WITH DIFFERENTIAL     Status: Abnormal   Collection Time: 12/16/18  2:16 PM  Result Value Ref Range   WBC 11.5 (H) 4.0 - 10.5 K/uL   RBC 2.37 (L) 3.87 - 5.11 MIL/uL   Hemoglobin 7.1 (L) 12.0 - 15.0 g/dL   HCT 23.5 (L) 36.0 - 46.0 %   MCV 99.2 80.0 - 100.0 fL   MCH 30.0 26.0 - 34.0 pg   MCHC 30.2 30.0 - 36.0 g/dL   RDW 15.1 11.5 - 15.5 %   Platelets 450 (H) 150 - 400 K/uL   nRBC 0.0 0.0 - 0.2 %   Neutrophils Relative % 79 %   Neutro Abs 9.2 (H) 1.7 - 7.7 K/uL   Lymphocytes Relative 11 %   Lymphs Abs 1.3 0.7 - 4.0 K/uL   Monocytes Relative 6 %   Monocytes Absolute 0.6 0.1 - 1.0 K/uL   Eosinophils Relative 2 %   Eosinophils Absolute 0.2 0.0 - 0.5 K/uL   Basophils Relative 1 %   Basophils Absolute 0.1 0.0 - 0.1 K/uL   Immature Granulocytes 1 %   Abs Immature Granulocytes 0.12 (H) 0.00 - 0.07 K/uL    Comment: Performed at [facility], [place] [address], [place], [place] [address]  Protime-INR     Status: None   Collection Time: 12/16/18  2:16 PM  Result Value Ref Range   Prothrombin Time 14.0 11.4 - 15.2 seconds   INR 1.09     Comment: Performed at [facility], [place] [address], [place], [place] [address]  Type and screen [place]     Status: None (Preliminary result)   Collection Time: 12/16/18  2:16 PM  Result Value Ref Range   ABO/RH(D) O POS    Antibody Screen NEG    Sample Expiration  12/19/2018    Unit Number [id number]    Blood Component Type RED CELLS,LR  Unit division 00    Status of Unit ALLOCATED    Transfusion Status OK TO TRANSFUSE    Crossmatch Result Compatible    Unit Number [id number]    Blood Component Type RED CELLS,LR    Unit division 00    Status of Unit ISSUED    Transfusion Status OK TO TRANSFUSE    Crossmatch Result      Compatible Performed at [facility] [facility] [address] [place], [place] [address]   Prepare RBC     Status: None   Collection Time: 12/16/18  2:16 PM  Result Value Ref Range   Order Confirmation      ORDER PROCESSED BY BLOOD BANK Performed at [facility] [place] [address] [place], [place] [address]   ABO/Rh     Status: None (Preliminary result)   Collection Time: 12/16/18  2:16 PM  Result Value Ref Range   ABO/RH(D)      O POS Performed at [facility], [place] [address] [place], [place] [address]    No results found.  Review of Systems  Constitutional: Negative for chills, diaphoresis and fever.  HENT: Negative for hearing loss.   Eyes: Negative for blurred vision.  Respiratory: Negative for shortness of breath.   Cardiovascular: Negative for chest pain, palpitations and leg swelling.  Gastrointestinal: Positive for blood in stool. Negative for abdominal pain, nausea and vomiting.  Genitourinary: Negative for dysuria.  Neurological: Negative for dizziness.    Blood pressure (!) 101/57, pulse 80, temperature 98.6 F (37 C), temperature source Oral, resp. rate 13, height 5\' 3"  (1.6 m), weight 72.6 kg, SpO2 100 %. Physical Exam  Constitutional: She is oriented to person, place, and time.  HENT:  Head: Normocephalic and atraumatic.  Eyes: Left eye exhibits no discharge.  Conjunctiva pale sclera nonicteric  Neck: No JVD present. No tracheal deviation present. No thyromegaly present.  Cardiovascular: Normal rate and regular rhythm.  Murmur (Soft  systolic murmur noted no S3 gallop) heard. Respiratory: Effort normal and breath sounds normal. No respiratory distress. She has no wheezes. She has no rales.  GI: Soft. Bowel sounds are normal. She exhibits no distension. There is no abdominal tenderness. There is no rebound.  Musculoskeletal:        General: No tenderness, deformity or edema.  Neurological: She is alert and oriented to person, place, and time.     Assessment/Plan Acute lower GI bleeding status post recent colonoscopy/polypectomy Acute blood loss anemia Status post hypotension secondary to above Coronary artery disease status post CABG/congenital valvular disease status post bioprosthetic aortic valve replacement Hypertension Diabetes mellitus Hyperlipidemia Chronic kidney disease stage II/III Tobacco abuse Depression Plan As per orders Patient scheduled for colonoscopy later this evening Hold antiplatelet medications. Patient advised not to take any NSAIDs in view of bleeding and also chronic kidney disease Follow serial H&H Counseled briefly regarding smoking cessation Charolette Forward, MD 12/16/2018, 4:39 PM

## 2018-12-16 NOTE — Consult Note (Addendum)
Referring Provider:  Dr. Sherwood Gambler ([place]) Primary Care Physician:  Dixie Dials, MD Primary Gastroenterologist:  Dr. Alessandra Bevels  Reason for Consultation:  Hematochezia and anemia  HPI: Renee Griffin is a 62 y.o. female on disability with heart disease (congenital valvular abnormality and history of bypass surgery), diabetes, dyslipidemia, history of depression and maintained on Plavix, who is 10 days status post colonoscopy with removal of 11 polyps by Dr. Alessandra Bevels, all removed by hot snare technique, the largest approximately 10 mm in diameter, from the region of the hepatic flexure, transverse colon, and sigmoid colon.  The patient is on Plavix because of heart disease (apparently has some sort of congenital valve defect, as well as history of coronary disease status post bypass surgery) and was off her Plavix for 5 days prior to the procedure but resumed it the day after the procedure.  She indicates she had a small amount of blood the day after the procedure but then went the next 9 days without any bleeding.  This morning, she had several bloody bowel movements without abdominal pain or presyncopal symptoms; she called the ambulance and was brought to the [facility] long emergency room.  Since arrival, the patient has had at least 3 additional large-volume bowel movements with blood and clots.  Furthermore, the patient's hemoglobin has dropped approximately 4.5 g from her baseline 7 months earlier.  Her blood pressure has been borderline, systolic in the 45Y.  The patient denies chest pain or trouble breathing.  At baseline, the patient walks with a cane and does not have dyspnea or chest pain, although she is a current smoker.   Past Medical History:  Diagnosis Date  . Depression   . Diabetes mellitus without complication ([place])   . Heart disease, congenital   . High cholesterol   . Hypertension   . Panic attack   . Stroke [facility])     Past Surgical History:  Procedure  Laterality Date  . CARDIAC SURGERY  2011   CABG  . COLONOSCOPY WITH PROPOFOL N/A 02/08/2018   Procedure: COLONOSCOPY WITH PROPOFOL;  Surgeon: Otis Brace, MD;  Location: [facility] ENDOSCOPY;  Service: Gastroenterology;  Laterality: N/A;  . ESOPHAGOGASTRODUODENOSCOPY (EGD) WITH PROPOFOL Left 02/05/2018   Procedure: ESOPHAGOGASTRODUODENOSCOPY (EGD) WITH PROPOFOL;  Surgeon: Otis Brace, MD;  Location: [facility] ENDOSCOPY;  Service: Gastroenterology;  Laterality: Left;  . WISDOM TOOTH EXTRACTION      Prior to Admission medications   Medication Sig Start Date End Date Taking? Authorizing Provider  ACCU-CHEK AVIVA PLUS test strip See admin instructions. 01/29/16   [provider]  ACCU-CHEK SOFTCLIX LANCETS lancets See admin instructions. 01/30/16   [provider]  acetaminophen-codeine (TYLENOL #3) 300-30 MG per tablet Take 1-2 tablets by mouth daily as needed for moderate pain. Patient taking differently: Take 1 tablet by mouth daily.  03/05/15   Rosalin Hawking, MD  ALPRAZolam Duanne Moron) 0.5 MG tablet Take 0.25 mg by mouth 3 (three) times daily.  01/28/18   [provider]  Blood Glucose Monitoring Suppl (ACCU-CHEK AVIVA PLUS) w/Device KIT See admin instructions. 01/29/16   [provider]  Cholecalciferol (VITAMIN D-3) 1000 UNITS CAPS Take 1,000 Units by mouth daily.    [provider]  diltiazem (CARDIZEM) 90 MG tablet Take 1 tablet (90 mg total) by mouth 2 (two) times daily. 02/09/18   Dixie Dials, MD  furosemide (LASIX) 20 MG tablet Take 1 tablet (20 mg total) by mouth every Monday, Wednesday, and Friday. 04/16/18   Dixie Dials, MD  gemfibrozil (LOPID) 600 MG tablet Take 600 mg by mouth 2 (two) times daily. 02/19/18   [provider]  loperamide (IMODIUM) 2 MG capsule Take 2 mg by mouth daily as needed for diarrhea or loose stools.  02/22/18   [provider]  loperamide (IMODIUM) 2 MG capsule Take 1 capsule (2 mg total) by mouth 4 (four) times  daily as needed for diarrhea or loose stools. 05/18/18   Kirichenko, Lahoma Rocker, PA-C  metFORMIN (GLUCOPHAGE) 500 MG tablet Take 1 tablet (500 mg total) by mouth 2 (two) times daily. 04/15/18   Dixie Dials, MD  Multiple Vitamin (MULTIVITAMIN WITH MINERALS) TABS tablet Take 1 tablet by mouth daily.    [provider]  oxymetazoline (AFRIN) 0.05 % nasal spray Place 1 spray into both nostrils 2 (two) times daily.    [provider]  pantoprazole (PROTONIX) 40 MG tablet Take 1 tablet (40 mg total) by mouth 2 (two) times daily. 02/09/18   Dixie Dials, MD  potassium chloride (K-DUR,KLOR-CON) 10 MEQ tablet Take 10 mEq by mouth daily.    [provider]  prazosin (MINIPRESS) 1 MG capsule Take 1 capsule (1 mg total) by mouth 2 (two) times daily. 02/09/18   Dixie Dials, MD  TURMERIC PO Take 1,440 mg by mouth daily.    [provider]  valACYclovir (VALTREX) 500 MG tablet Take 500 mg by mouth daily.    [provider]    Current Facility-Administered Medications  Medication Dose Route Frequency Provider Last Rate Last Dose  . 0.9 %  sodium chloride infusion  10 mL/hr Intravenous Once Sherwood Gambler, MD       Current Outpatient Medications  Medication Sig Dispense Refill  . ACCU-CHEK AVIVA PLUS test strip See admin instructions.  0  . ACCU-CHEK SOFTCLIX LANCETS lancets See admin instructions.  3  . acetaminophen-codeine (TYLENOL #3) 300-30 MG per tablet Take 1-2 tablets by mouth daily as needed for moderate pain. (Patient taking differently: Take 1 tablet by mouth daily. ) 45 tablet 0  . ALPRAZolam (XANAX) 0.5 MG tablet Take 0.25 mg by mouth 3 (three) times daily.   1  . Blood Glucose Monitoring Suppl (ACCU-CHEK AVIVA PLUS) w/Device KIT See admin instructions.  0  . Cholecalciferol (VITAMIN D-3) 1000 UNITS CAPS Take 1,000 Units by mouth daily.    Marland Kitchen diltiazem (CARDIZEM) 90 MG tablet Take 1 tablet (90 mg total) by mouth 2 (two) times daily. 60 tablet 3  .  furosemide (LASIX) 20 MG tablet Take 1 tablet (20 mg total) by mouth every Monday, Wednesday, and Friday.    Marland Kitchen gemfibrozil (LOPID) 600 MG tablet Take 600 mg by mouth 2 (two) times daily.  2  . loperamide (IMODIUM) 2 MG capsule Take 2 mg by mouth daily as needed for diarrhea or loose stools.     Marland Kitchen loperamide (IMODIUM) 2 MG capsule Take 1 capsule (2 mg total) by mouth 4 (four) times daily as needed for diarrhea or loose stools. 12 capsule 0  . metFORMIN (GLUCOPHAGE) 500 MG tablet Take 1 tablet (500 mg total) by mouth 2 (two) times daily. 60 tablet 6  . Multiple Vitamin (MULTIVITAMIN WITH MINERALS) TABS tablet Take 1 tablet by mouth daily.    Marland Kitchen oxymetazoline (AFRIN) 0.05 % nasal spray Place 1 spray into both nostrils 2 (two) times daily.    . pantoprazole (PROTONIX) 40 MG tablet Take 1 tablet (40 mg total) by mouth 2 (two) times daily. 60 tablet 6  . potassium chloride (K-DUR,KLOR-CON) 10  MEQ tablet Take 10 mEq by mouth daily.    . prazosin (MINIPRESS) 1 MG capsule Take 1 capsule (1 mg total) by mouth 2 (two) times daily. 60 capsule 3  . TURMERIC PO Take 1,440 mg by mouth daily.    . valACYclovir (VALTREX) 500 MG tablet Take 500 mg by mouth daily.      Allergies as of 12/16/2018 - Review Complete 12/16/2018  Allergen Reaction Noted  . Amoxicillin-pot clavulanate Diarrhea 07/11/2014  . Bupropion Nausea Only 07/11/2014  . Nicotine Nausea Only 07/11/2014    Family History  Problem Relation Age of Onset  . Cancer Mother        uterian OR cervical  . Cancer Father        lung  . Leukemia Brother   . Cancer Sister        breast    Social History   Socioeconomic History  . Marital status: Widowed    Spouse name: Not on file  . Number of children: Not on file  . Years of education: Not on file  . Highest education level: Not on file  Occupational History  . Not on file  Social Needs  . Financial resource strain: Not on file  . Food insecurity:    Worry: Not on file    Inability:  Not on file  . Transportation needs:    Medical: Not on file    Non-medical: Not on file  Tobacco Use  . Smoking status: Current Every Day Smoker    Packs/day: 1.00    Years: 50.00    Pack years: 50.00    Types: Cigarettes  . Smokeless tobacco: Never Used  Substance and Sexual Activity  . Alcohol use: No    Alcohol/week: 0.0 standard drinks  . Drug use: No  . Sexual activity: Never  Lifestyle  . Physical activity:    Days per week: Not on file    Minutes per session: Not on file  . Stress: Not on file  Relationships  . Social connections:    Talks on phone: Not on file    Gets together: Not on file    Attends religious service: Not on file    Active member of club or organization: Not on file    Attends meetings of clubs or organizations: Not on file    Relationship status: Not on file  . Intimate partner violence:    Fear of current or ex partner: Not on file    Emotionally abused: Not on file    Physically abused: Not on file    Forced sexual activity: Not on file  Other Topics Concern  . Not on file  Social History Narrative  . Not on file    Review of Systems: See history of present illness  Physical Exam: Vital signs in last 24 hours: Temp:  [97.6 F (36.4 C)] 97.6 F (36.4 C) (12/22 1342) Pulse Rate:  [73-80] 80 (12/22 1516) Resp:  [13-22] 16 (12/22 1516) BP: (85-106)/(50-57) 106/53 (12/22 1516) SpO2:  [100 %] 100 % (12/22 1516) Weight:  [72.6 kg] 72.6 kg (12/22 1342)   This is a somewhat overweight Caucasian female, restless in bed but not in acute distress.  She is very pale but not shocky, blood pressure 90 systolic, heart rate in the 80s, chest is clear, heart has a 3/6 systolic murmur at the upper left sternal border.  Abdomen adipose but without any mass-effect or tenderness.  Oropharynx benign, slightly dry mucous membranes.  Radial  pulse is reasonably full and skin is warm.  Intake/Output from previous day: No intake/output data  recorded. Intake/Output this shift: Total I/O In: 520.6 [IV Piggyback:520.6] Out: -   Lab Results: Recent Labs    12/16/18 1408 12/16/18 1416  WBC  --  11.5*  HGB 6.8* 7.1*  HCT 20.0* 23.5*  PLT  --  450*   BMET Recent Labs    12/16/18 1408  NA 143  K 4.2  CL 117*  GLUCOSE 119*  BUN 25*  CREATININE 1.80*   LFT No results for input(s): PROT, ALBUMIN, AST, ALT, ALKPHOS, BILITOT, BILIDIR, IBILI in the last 72 hours. PT/INR No results for input(s): LABPROT, INR in the last 72 hours.  Studies/Results: No results found.  Impression: 1.  Large volume recurrent hematochezia, most likely post polypectomy hemorrhage (no mention of diverticulosis on recent colonoscopy report) 2.  Severe posthemorrhagic anemia without severe hemodynamic instability 3.  History of cardiac disease as described above 4.  History of multiple colonic polyps, both on recent exam and also with 2 medium sized polyps removed approximately 10 months earlier  Plan: Agree with transfusion.  Will attempt colonoscopy later this evening after she has received some blood and volume restitution.  The hope is that we will be able to find the bleeding site and clip.  If there is too much blood to do an adequate exam, the patient will then be prepped overnight with the intent of repeating the exam tomorrow.  The risks of colonoscopy in the setting were reviewed with the patient, as well as the possibility that the procedure may not be successful in locating the bleeding site.  She is desirous of proceeding.   LOS: 0 days   Renee Griffin  12/16/2018, 3:33 PM   Pager [phone] If no answer or after 5 PM call [phone]

## 2018-12-16 NOTE — ED Notes (Signed)
Called [facility] to check on bld status. Bld isn't ready, lab states they will call as soon as it is.

## 2018-12-16 NOTE — Anesthesia Postprocedure Evaluation (Deleted)
Anesthesia Post Note  Patient: Renee Griffin  Procedure(s) Performed: COLONOSCOPY WITH PROPOFOL (N/A )     Patient location during evaluation: Endoscopy Anesthesia Type: MAC Level of consciousness: sedated Pain management: pain level controlled Vital Signs Assessment: post-procedure vital signs reviewed and stable Respiratory status: spontaneous breathing Cardiovascular status: stable Postop Assessment: no apparent nausea or vomiting Anesthetic complications: no    Last Vitals:  Vitals:   12/16/18 1715 12/16/18 1730  BP: 119/61 (!) 109/51  Pulse: 81 80  Resp: 14 15  Temp:  36.7 C  SpO2: 100% 100%    Last Pain:  Vitals:   12/16/18 1730  TempSrc: Oral  PainSc:    Pain Goal:                 Huston Foley

## 2018-12-16 NOTE — Op Note (Signed)
[facility] Patient Name: Renee Griffin Procedure Date: 12/16/2018 MRN: [id number] Attending MD: Ronald Lobo , MD Date of Birth: December 03, 1956 CSN: [id number] Age: 62 Admit Type: Emergency Department Procedure:                Colonoscopy Indications:              Hematochezia, Acute post hemorrhagic anemia (hgb                            7.1) 10 days following removal of 11 colon polyps                            by hot snare, in a patient on Plavix. Providers:                Ronald Lobo, MD, Carlyn Reichert, RN, Charolette Child, Technician, Dellie Catholic Referring MD:              Medicines:                Monitored Anesthesia Care Complications:            No immediate complications. Estimated Blood Loss:     Estimated blood loss: none. Estimated blood loss:                            none. Procedure:                Pre-Anesthesia Assessment:                           - Prior to the procedure, a History and Physical                            was performed, and patient medications and                            allergies were reviewed. The patient's tolerance of                            previous anesthesia was also reviewed. The risks                            and benefits of the procedure and the sedation                            options and risks were discussed with the patient.                            All questions were answered, and informed consent                            was obtained. Prior Anticoagulants: The patient has  taken Plavix (clopidogrel), last dose was day of                            procedure. ASA Grade Assessment: III - A patient                            with severe systemic disease. After reviewing the                            risks and benefits, the patient was deemed in                            satisfactory condition to undergo the procedure.                           After  obtaining informed consent, the colonoscope                            was passed under direct vision. Throughout the                            procedure, the patient's blood pressure, pulse, and                            oxygen saturations were monitored continuously. The                            CF-HQ190L ([id number]) Olympus adult colonoscope was                            introduced through the anus and advanced to the the                            cecum, identified by transillumination. The                            colonoscopy was performed without difficulty. The                            patient tolerated the procedure well. The quality                            of the bowel preparation (unprepped) was fair--no                            stool, but lots of clot. Scope In: 6:02:58 PM Scope Out: 6:25:03 PM Scope Withdrawal Time: 0 hours 16 minutes 11 seconds  Total Procedure Duration: 0 hours 22 minutes 5 seconds  Findings:      The perianal examination was normal.      A large amount of clotted blood was present, scattered throughout the       distal colon. There was essentially no blood proximally, from the       proximal transverse colon to the cecum, except for some old  blood       puddled in the cecum.      The source of bleeding appeared to be in the mid-transverse colon, where       there was adherent fresh red clot (but no liquid red blood or evidence       of current bleeding). The clot was very large and obscured the colonic       wall beneath it (despite attempts to irrigate it away). It is assumed       that there was a polypectomy site beneath it, but because I could not       visualize it, no interventions such as clipping or injection therapy       were performed.      A few medium-mouthed diverticula were found in the sigmoid colon.      A polypectomy eschar, with a clean base and no stigma of hemorrhage, was       present near the splenic flexure.      The  exam was otherwise without abnormality.      The retroflexed view of the distal rectum and anal verge was normal and       showed no anal or rectal abnormalities. Impression:               - NO EVIDENCE OF ACTIVE BLEEDING AT THE TIME OF                            THIS EXAM.                           - SUSPECT SOURCE IN MID-TRANSVERSE COLON (see above                            discussion).                           - Preparation of the colon was fair.                           - Blood in the rectum, in the sigmoid colon, in the                            descending colon and in the transverse colon.                           - Diverticulosis in the sigmoid colon.                           - The examination was otherwise normal.                           - The distal rectum and anal verge are normal on                            retroflexion view.                           - No specimens collected. Moderate Sedation:      This patient  was sedated with monitored anesthesia care, not moderate       sedation. Recommendation:           - Repeat colonoscopy PRN to review the polypectomy                            site. Procedure Code(s):        --- Professional ---                           [phone], Colonoscopy, flexible; diagnostic, including                            collection of specimen(s) by brushing or washing,                            when performed (separate procedure) Diagnosis Code(s):        --- Professional ---                           K92.1, Melena (includes Hematochezia)                           D62, Acute posthemorrhagic anemia CPT copyright 2018 American Medical Association. All rights reserved. The codes documented in this report are preliminary and upon coder review may  be revised to meet current compliance requirements. Ronald Lobo, MD 12/16/2018 7:07:05 PM This report has been signed electronically. Number of Addenda: 0

## 2018-12-17 ENCOUNTER — Other Ambulatory Visit: Payer: Self-pay

## 2018-12-17 ENCOUNTER — Encounter (HOSPITAL_COMMUNITY): Payer: Self-pay | Admitting: Gastroenterology

## 2018-12-17 LAB — CBC
HCT: 20.9 % — ABNORMAL LOW (ref 36.0–46.0)
HCT: 28.7 % — ABNORMAL LOW (ref 36.0–46.0)
HCT: 26.3 % — ABNORMAL LOW (ref 36.0–46.0)
HEMOGLOBIN: 6.4 g/dL — AB (ref 12.0–15.0)
HEMOGLOBIN: 9.4 g/dL — AB (ref 12.0–15.0)
Hemoglobin: 8.6 g/dL — ABNORMAL LOW (ref 12.0–15.0)
MCH: 29.6 pg (ref 26.0–34.0)
MCH: 29.8 pg (ref 26.0–34.0)
MCH: 29.5 pg (ref 26.0–34.0)
MCHC: 30.6 g/dL (ref 30.0–36.0)
MCHC: 32.8 g/dL (ref 30.0–36.0)
MCHC: 32.7 g/dL (ref 30.0–36.0)
MCV: 96.8 fL (ref 80.0–100.0)
MCV: 91.1 fL (ref 80.0–100.0)
MCV: 90.1 fL (ref 80.0–100.0)
Platelets: 317 10*3/uL (ref 150–400)
Platelets: 279 10*3/uL (ref 150–400)
Platelets: 274 10*3/uL (ref 150–400)
RBC: 2.16 MIL/uL — ABNORMAL LOW (ref 3.87–5.11)
RBC: 3.15 MIL/uL — ABNORMAL LOW (ref 3.87–5.11)
RBC: 2.92 MIL/uL — ABNORMAL LOW (ref 3.87–5.11)
RDW: 15.7 % — ABNORMAL HIGH (ref 11.5–15.5)
RDW: 15.3 % (ref 11.5–15.5)
RDW: 15.5 % (ref 11.5–15.5)
WBC: 9.6 10*3/uL (ref 4.0–10.5)
WBC: 9.9 10*3/uL (ref 4.0–10.5)
WBC: 9.5 10*3/uL (ref 4.0–10.5)
nRBC: 0.2 % (ref 0.0–0.2)
nRBC: 0.6 % — ABNORMAL HIGH (ref 0.0–0.2)
nRBC: 0.5 % — ABNORMAL HIGH (ref 0.0–0.2)

## 2018-12-17 LAB — BASIC METABOLIC PANEL
Anion gap: 8 (ref 5–15)
BUN: 23 mg/dL (ref 8–23)
CO2: 16 mmol/L — AB (ref 22–32)
Calcium: 8 mg/dL — ABNORMAL LOW (ref 8.9–10.3)
Chloride: 117 mmol/L — ABNORMAL HIGH (ref 98–111)
Creatinine, Ser: 1.83 mg/dL — ABNORMAL HIGH (ref 0.44–1.00)
GFR calc Af Amer: 33 mL/min — ABNORMAL LOW (ref >60)
GFR calc non Af Amer: 29 mL/min — ABNORMAL LOW (ref >60)
Glucose, Bld: 158 mg/dL — ABNORMAL HIGH (ref 70–99)
Potassium: 3.9 mmol/L (ref 3.5–5.1)
Sodium: 141 mmol/L (ref 135–145)

## 2018-12-17 LAB — GLUCOSE, CAPILLARY
GLUCOSE-CAPILLARY: 90 mg/dL (ref 70–99)
Glucose-Capillary: 97 mg/dL (ref 70–99)
Glucose-Capillary: 88 mg/dL (ref 70–99)
Glucose-Capillary: 85 mg/dL (ref 70–99)

## 2018-12-17 LAB — PREPARE RBC (CROSSMATCH)

## 2018-12-17 LAB — HEMOGLOBIN A1C
HEMOGLOBIN A1C: 5.2 % (ref 4.8–5.6)
Mean Plasma Glucose: 102.54 mg/dL

## 2018-12-17 MED ORDER — AMLODIPINE BESYLATE 5 MG PO TABS
5.0000 mg | ORAL_TABLET | Freq: Every day | ORAL | Status: DC
  Administered 2018-12-17 – 2018-12-25 (×9): 5 mg via ORAL
  Filled 2018-12-17 (×9): qty 1

## 2018-12-17 MED ORDER — SODIUM CHLORIDE 0.9% IV SOLUTION
Freq: Once | INTRAVENOUS | Status: AC
  Administered 2018-12-17: 04:00:00 via INTRAVENOUS

## 2018-12-17 MED ORDER — ONDANSETRON HCL 4 MG/2ML IJ SOLN
4.0000 mg | Freq: Four times a day (QID) | INTRAMUSCULAR | Status: DC | PRN
  Administered 2018-12-24 (×2): 4 mg via INTRAVENOUS
  Filled 2018-12-17 (×2): qty 2

## 2018-12-17 MED ORDER — ALPRAZOLAM 0.25 MG PO TABS
0.2500 mg | ORAL_TABLET | Freq: Three times a day (TID) | ORAL | Status: DC | PRN
  Administered 2018-12-17 – 2018-12-21 (×9): 0.25 mg via ORAL
  Filled 2018-12-17 (×9): qty 1

## 2018-12-17 MED ORDER — HYDRALAZINE HCL 25 MG PO TABS
25.0000 mg | ORAL_TABLET | Freq: Four times a day (QID) | ORAL | Status: DC | PRN
  Administered 2018-12-17 – 2018-12-20 (×4): 25 mg via ORAL
  Filled 2018-12-17 (×5): qty 1

## 2018-12-17 MED ORDER — SODIUM CHLORIDE 0.9% IV SOLUTION: Freq: Once | INTRAVENOUS | Status: DC

## 2018-12-17 MED ORDER — ACETAMINOPHEN 325 MG PO TABS
650.0000 mg | ORAL_TABLET | Freq: Four times a day (QID) | ORAL | Status: DC | PRN
  Administered 2018-12-17 – 2018-12-25 (×15): 650 mg via ORAL
  Filled 2018-12-17 (×15): qty 2

## 2018-12-17 NOTE — Progress Notes (Signed)
Subjective:  Appreciate GI consult and help.  Patient denies any chest pain or shortness of breath.  Complains of anxiety.  States takes Xanax at home.  Hemoglobin still remains low despite blood transfusion.  No obvious active bleeding going to receive one more unit of packed RBC.  Objective:  Vital Signs in the last 24 hours: Temp:  [97.6 F (36.4 C)-98.7 F (37.1 C)] 98 F (36.7 C) (12/23 0800) Pulse Rate:  [72-94] 80 (12/23 0800) Resp:  [10-22] 12 (12/23 0800) BP: (84-187)/(32-76) 187/76 (12/23 0800) SpO2:  [98 %-100 %] 99 % (12/23 0800) Weight:  [72.6 kg] 72.6 kg (12/22 1725)  Intake/Output from previous day: 12/22 0701 - 12/23 0700 In: 2232.2 [I.V.:900; Blood:811.7; IV Piggyback:520.6] Out: 2 [Urine:2] Intake/Output from this shift: No intake/output data recorded.  Physical Exam: Neck: no adenopathy, no carotid bruit, no JVD and supple, symmetrical, trachea midline Lungs: clear to auscultation bilaterally Heart: regular rate and rhythm, S1, S2 normal and soft systolic murmur noted Abdomen: soft, non-tender; bowel sounds normal; no masses,  no organomegaly Extremities: extremities normal, atraumatic, no cyanosis or edema  Lab Results: Recent Labs    12/16/18 1416 12/16/18 2050 12/17/18 0136  WBC 11.5*  --  9.6  HGB 7.1* 8.6* 6.4*  PLT 450*  --  317   Recent Labs    12/16/18 1416 12/17/18 0136  NA 147* 141  K 4.2 3.9  CL 117* 117*  CO2 17* 16*  GLUCOSE 125* 158*  BUN 25* 23  CREATININE 1.79* 1.83*   No results for input(s): TROPONINI in the last 72 hours.  Invalid input(s): CK, MB Hepatic Function Panel Recent Labs    12/16/18 1416  PROT 5.7*  ALBUMIN 2.9*  AST 13*  ALT 9  ALKPHOS 150*  BILITOT 0.5   No results for input(s): CHOL in the last 72 hours. No results for input(s): PROTIME in the last 72 hours.  Imaging: Imaging results have been reviewed and No results found.  Cardiac Studies:  Assessment/Plan:  Acute lower GI bleeding status  post recent colonoscopy/polypectomy status post colonoscopy yesterday Acute blood loss anemia Status post hypotension secondary to above Coronary artery disease status post CABG/congenital valvular disease status post bioprosthetic aortic valve replacement Hypertension Diabetes mellitus Hyperlipidemia Chronic kidney disease stage II/III Tobacco abuse Depression Plan Type and cross match and transfuse as per GI. Follow serial H&H hopefully hemoglobin will stabilize. Restart Xanax as per orders  LOS: 1 day    Charolette Forward 12/17/2018, 8:51 AM

## 2018-12-17 NOTE — Progress Notes (Signed)
Patient given bowel prep and Linzess to help clean out old blood in GI tract. HGB 8.6 two hours  after 2nd unit of blood. Post blood- patient has had approximately 10 bloody bowel movements. MD on call paged to see if HGB needs to be rechecked before scheduled time (0500). Awaiting MD's call.

## 2018-12-17 NOTE — Progress Notes (Signed)
On call MD (Dr. Michail Sermon) gave verbal orders for a stat H&H. If hemoglobin less than 7 transfuse 2 units. Will continue to monitor patient and labs.

## 2018-12-17 NOTE — Progress Notes (Signed)
Patient, as expected, had very large amounts of blood output last night following "cleanout" with Nulytely at [place].  She never was hemodynamically unstable, but her hemoglobin fell to 6.4 (that was after 2 units of blood).  She has received another unit of blood through the night and an additional unit (her fourth) is pending at this time.  She has not passed any blood for several hours now.  On exam, the patient's vital signs are remarkably stable, with elevated blood pressure, and normal heart rate.  Her radial pulse is full, the skin is warm and dry, and she is somewhat anxious (she is on Xanax at home) but not really in any distress.  She denies shortness of breath, chest pain, or abdominal pain.  Impression: Unclear whether the patient has stopped bleeding, specifically whether the clot output and drop in hemoglobin during the night were a reflection of prior bleeding.  Plan: Await additional transfusion and updated hemoglobin, and follow vital signs and stool output.  N.p.o. after this morning's Nulytely, in case intervention such as repeat colonoscopy, IR embolization, or surgery are needed.  Cleotis Nipper, M.D. Pager [phone] If no answer or after 5 PM call [phone]

## 2018-12-17 NOTE — Progress Notes (Signed)
Rechecked on patient.  No GI bleeding since 2 am (nearly 10 hours).  Patient has just finished her last unit of blood.  She is weak, but has no abdominal pain.  Assessment:  1.  Hematochezia, suspect post-polypectomy bleeding.  Plan:  1.  Keep NPO. 2.  Awaiting post-transfusion CBC, results of which will help determine whether more transfusion is needed. 3.  Dr. Therisa Doyne will recheck on patient tomorrow; if bleeding remains quiescent, would likely consider liquid diet tomorrow. 4.  Anticipated at least a couple more days in the hospital.  Pending her functional status, she may need PT evaluation pre-hospital discharge. 5.  [facility] will follow.

## 2018-12-17 NOTE — Progress Notes (Signed)
CRITICAL VALUE ALERT  Critical Value:  Hgb 6.4  Date & Time Notied:  12/23 0205  Provider Notified: called [facility] (per on call GI MD)  Orders Received/Actions taken: 2 units RBC

## 2018-12-18 ENCOUNTER — Inpatient Hospital Stay (HOSPITAL_COMMUNITY): Payer: Medicare Other

## 2018-12-18 ENCOUNTER — Encounter (HOSPITAL_COMMUNITY): Payer: Self-pay | Admitting: Interventional Radiology

## 2018-12-18 HISTORY — PX: IR EMBO ART  VEN HEMORR LYMPH EXTRAV  INC GUIDE ROADMAPPING: IMG5450

## 2018-12-18 HISTORY — PX: IR US GUIDE VASC ACCESS RIGHT: IMG2390

## 2018-12-18 HISTORY — PX: IR ANGIOGRAM SELECTIVE EACH ADDITIONAL VESSEL: IMG667

## 2018-12-18 HISTORY — PX: IR ANGIOGRAM VISCERAL SELECTIVE: IMG657

## 2018-12-18 LAB — CBC
HCT: 25.9 % — ABNORMAL LOW (ref 36.0–46.0)
HCT: 30.7 % — ABNORMAL LOW (ref 36.0–46.0)
Hemoglobin: 8.5 g/dL — ABNORMAL LOW (ref 12.0–15.0)
Hemoglobin: 10.1 g/dL — ABNORMAL LOW (ref 12.0–15.0)
MCH: 29.8 pg (ref 26.0–34.0)
MCH: 29.9 pg (ref 26.0–34.0)
MCHC: 32.8 g/dL (ref 30.0–36.0)
MCHC: 32.9 g/dL (ref 30.0–36.0)
MCV: 90.9 fL (ref 80.0–100.0)
MCV: 90.8 fL (ref 80.0–100.0)
Platelets: 258 10*3/uL (ref 150–400)
Platelets: 266 10*3/uL (ref 150–400)
RBC: 2.85 MIL/uL — ABNORMAL LOW (ref 3.87–5.11)
RBC: 3.38 MIL/uL — ABNORMAL LOW (ref 3.87–5.11)
RDW: 15.6 % — ABNORMAL HIGH (ref 11.5–15.5)
RDW: 14.8 % (ref 11.5–15.5)
WBC: 8.7 10*3/uL (ref 4.0–10.5)
WBC: 14.6 10*3/uL — ABNORMAL HIGH (ref 4.0–10.5)
nRBC: 0.3 % — ABNORMAL HIGH (ref 0.0–0.2)
nRBC: 0.3 % — ABNORMAL HIGH (ref 0.0–0.2)

## 2018-12-18 LAB — GLUCOSE, CAPILLARY
Glucose-Capillary: 86 mg/dL (ref 70–99)
Glucose-Capillary: 83 mg/dL (ref 70–99)
Glucose-Capillary: 83 mg/dL (ref 70–99)
Glucose-Capillary: 81 mg/dL (ref 70–99)

## 2018-12-18 LAB — PREPARE RBC (CROSSMATCH)

## 2018-12-18 MED ORDER — MIDAZOLAM HCL 2 MG/2ML IJ SOLN
INTRAMUSCULAR | Status: AC | PRN
  Administered 2018-12-18 (×3): 1 mg via INTRAVENOUS

## 2018-12-18 MED ORDER — FENTANYL CITRATE (PF) 100 MCG/2ML IJ SOLN
INTRAMUSCULAR | Status: AC | PRN
  Administered 2018-12-18 (×2): 50 ug via INTRAVENOUS

## 2018-12-18 MED ORDER — IODIXANOL 320 MG/ML IV SOLN
50.0000 mL | Freq: Once | INTRAVENOUS | Status: AC | PRN
  Administered 2018-12-18: 10 mL via INTRA_ARTERIAL

## 2018-12-18 MED ORDER — FENTANYL CITRATE (PF) 100 MCG/2ML IJ SOLN
INTRAMUSCULAR | Status: AC
  Filled 2018-12-18: qty 2

## 2018-12-18 MED ORDER — LIDOCAINE HCL (PF) 1 % IJ SOLN
INTRAMUSCULAR | Status: AC | PRN
  Administered 2018-12-18: 5 mL

## 2018-12-18 MED ORDER — LIDOCAINE HCL 1 % IJ SOLN
INTRAMUSCULAR | Status: AC
  Filled 2018-12-18: qty 20

## 2018-12-18 MED ORDER — SODIUM CHLORIDE 0.9% IV SOLUTION
Freq: Once | INTRAVENOUS | Status: AC
  Administered 2018-12-18: 10:00:00 via INTRAVENOUS

## 2018-12-18 MED ORDER — MIDAZOLAM HCL 2 MG/2ML IJ SOLN
INTRAMUSCULAR | Status: AC
  Filled 2018-12-18: qty 4

## 2018-12-18 MED ORDER — IODIXANOL 320 MG/ML IV SOLN
50.0000 mL | Freq: Once | INTRAVENOUS | Status: AC | PRN
  Administered 2018-12-18: 5 mL via INTRA_ARTERIAL

## 2018-12-18 MED ORDER — TECHNETIUM TC 99M-LABELED RED BLOOD CELLS IV KIT
25.0000 | PACK | Freq: Once | INTRAVENOUS | Status: AC
  Administered 2018-12-18: 25 via INTRAVENOUS

## 2018-12-18 NOTE — Progress Notes (Signed)
Subjective: The patient was seen and examined at bedside. She had 1 bloody bowel movement at 6:30 PM yesterday evening and has had multiple at least 10 rectal bleeding episodes since 6 AM today. Patient denies abdominal pain, nausea, vomiting. She has not dropped her blood pressure or became tachycardic.  Objective: Vital signs in last 24 hours: Temp:  [97.7 F (36.5 C)-99.3 F (37.4 C)] 97.8 F (36.6 C) (12/24 0800) Pulse Rate:  [68-88] 68 (12/24 0616) Resp:  [11-25] 12 (12/24 0616) BP: (160-208)/(54-105) 208/75 (12/24 0616) SpO2:  [96 %-100 %] 100 % (12/24 0616) Weight change:  Last BM Date: 12/17/18  PE:in distress due to frequent rectal bleeding GENERAL:lying on bed, prominent pallor ABDOMEN:nondistended EXTREMITIES:no deformity  Lab Results: Results for orders placed or performed during the hospital encounter of 12/16/18 (from the past 48 hour(s))  I-Stat Chem 8, ED     Status: Abnormal   Collection Time: 12/16/18  2:08 PM  Result Value Ref Range   Sodium 143 135 - 145 mmol/L   Potassium 4.2 3.5 - 5.1 mmol/L   Chloride 117 (H) 98 - 111 mmol/L   BUN 25 (H) 8 - 23 mg/dL   Creatinine, Ser 1.80 (H) 0.44 - 1.00 mg/dL   Glucose, Bld 119 (H) 70 - 99 mg/dL   Calcium, Ion 1.23 1.15 - 1.40 mmol/L   TCO2 20 (L) 22 - 32 mmol/L   Hemoglobin 6.8 (LL) 12.0 - 15.0 g/dL   HCT 20.0 (L) 36.0 - 46.0 %   Comment NOTIFIED PHYSICIAN   Comprehensive metabolic panel     Status: Abnormal   Collection Time: 12/16/18  2:16 PM  Result Value Ref Range   Sodium 147 (H) 135 - 145 mmol/L   Potassium 4.2 3.5 - 5.1 mmol/L   Chloride 117 (H) 98 - 111 mmol/L   CO2 17 (L) 22 - 32 mmol/L   Glucose, Bld 125 (H) 70 - 99 mg/dL   BUN 25 (H) 8 - 23 mg/dL   Creatinine, Ser 1.79 (H) 0.44 - 1.00 mg/dL   Calcium 9.0 8.9 - 10.3 mg/dL   Total Protein 5.7 (L) 6.5 - 8.1 g/dL   Albumin 2.9 (L) 3.5 - 5.0 g/dL   AST 13 (L) 15 - 41 U/L   ALT 9 0 - 44 U/L   Alkaline Phosphatase 150 (H) 38 - 126 U/L   Total  Bilirubin 0.5 0.3 - 1.2 mg/dL   GFR calc non Af Amer 30 (L) >60 mL/min   GFR calc Af Amer 34 (L) >60 mL/min   Anion gap 13 5 - 15    Comment: Performed at [facility], [place] [address], [place], [place] [address]  CBC WITH DIFFERENTIAL     Status: Abnormal   Collection Time: 12/16/18  2:16 PM  Result Value Ref Range   WBC 11.5 (H) 4.0 - 10.5 K/uL   RBC 2.37 (L) 3.87 - 5.11 MIL/uL   Hemoglobin 7.1 (L) 12.0 - 15.0 g/dL   HCT 23.5 (L) 36.0 - 46.0 %   MCV 99.2 80.0 - 100.0 fL   MCH 30.0 26.0 - 34.0 pg   MCHC 30.2 30.0 - 36.0 g/dL   RDW 15.1 11.5 - 15.5 %   Platelets 450 (H) 150 - 400 K/uL   nRBC 0.0 0.0 - 0.2 %   Neutrophils Relative % 79 %   Neutro Abs 9.2 (H) 1.7 - 7.7 K/uL   Lymphocytes Relative 11 %   Lymphs Abs 1.3 0.7 - 4.0 K/uL  Monocytes Relative 6 %   Monocytes Absolute 0.6 0.1 - 1.0 K/uL   Eosinophils Relative 2 %   Eosinophils Absolute 0.2 0.0 - 0.5 K/uL   Basophils Relative 1 %   Basophils Absolute 0.1 0.0 - 0.1 K/uL   Immature Granulocytes 1 %   Abs Immature Granulocytes 0.12 (H) 0.00 - 0.07 K/uL    Comment: Performed at [facility], [place] [address] [place], [place] [address]  Protime-INR     Status: None   Collection Time: 12/16/18  2:16 PM  Result Value Ref Range   Prothrombin Time 14.0 11.4 - 15.2 seconds   INR 1.09     Comment: Performed at [facility], [place] [address], [place], [place] [address]  Type and screen [place]     Status: None (Preliminary result)   Collection Time: 12/16/18  2:16 PM  Result Value Ref Range   ABO/RH(D) O POS    Antibody Screen NEG    Sample Expiration 12/19/2018    Unit Number [id number]    Blood Component Type RED CELLS,LR    Unit division 00    Status of Unit ISSUED,FINAL    Transfusion Status OK TO TRANSFUSE    Crossmatch Result Compatible    Unit Number [id number]    Blood Component Type RED CELLS,LR    Unit division 00    Status of  Unit ISSUED,FINAL    Transfusion Status OK TO TRANSFUSE    Crossmatch Result Compatible    Unit Number [id number]    Blood Component Type RED CELLS,LR    Unit division 00    Status of Unit ALLOCATED    Transfusion Status OK TO TRANSFUSE    Crossmatch Result Compatible    Unit Number [id number]    Blood Component Type RED CELLS,LR    Unit division 00    Status of Unit ISSUED,FINAL    Transfusion Status OK TO TRANSFUSE    Crossmatch Result      Compatible Performed at [place] [address] [place], [place] [address]    Unit Number [id number]    Blood Component Type RED CELLS,LR    Unit division 00    Status of Unit ISSUED,FINAL    Transfusion Status OK TO TRANSFUSE    Crossmatch Result Compatible    Unit Number [id number]    Blood Component Type RED CELLS,LR    Unit division 00    Status of Unit ALLOCATED    Transfusion Status OK TO TRANSFUSE    Crossmatch Result Compatible   Prepare RBC     Status: None   Collection Time: 12/16/18  2:16 PM  Result Value Ref Range   Order Confirmation      ORDER PROCESSED BY BLOOD BANK Performed at [facility], [place] [address] [place], [place] [address]   ABO/Rh     Status: None   Collection Time: 12/16/18  2:16 PM  Result Value Ref Range   ABO/RH(D)      O POS Performed at [facility], [place] [address] [place], [place] [address]   Glucose, capillary     Status: Abnormal   Collection Time: 12/16/18  6:47 PM  Result Value Ref Range   Glucose-Capillary 114 (H) 70 - 99 mg/dL  MRSA PCR Screening     Status: None   Collection Time: 12/16/18  7:40 PM  Result Value Ref Range   MRSA by PCR NEGATIVE NEGATIVE  Comment:        The GeneXpert MRSA Assay (FDA approved for NASAL specimens only), is one component of a comprehensive MRSA colonization surveillance program. It is not intended to diagnose MRSA infection nor to guide or monitor treatment for MRSA  infections. Performed at [facility], [place] [address], [place], [place] [address]   Hemoglobin A1c     Status: None   Collection Time: 12/16/18  8:50 PM  Result Value Ref Range   Hgb A1c MFr Bld 5.2 4.8 - 5.6 %    Comment: (NOTE) Pre diabetes:          5.7%-6.4% Diabetes:              >6.4% Glycemic control for   <7.0% adults with diabetes    Mean Plasma Glucose 102.54 mg/dL    Comment: Performed at [place] [address] [place], [place] [address]  Hemoglobin and hematocrit, blood     Status: Abnormal   Collection Time: 12/16/18  8:50 PM  Result Value Ref Range   Hemoglobin 8.6 (L) 12.0 - 15.0 g/dL   HCT 27.3 (L) 36.0 - 46.0 %    Comment: Performed at [facility], [place] [address] [place], [place] [address]  Glucose, capillary     Status: Abnormal   Collection Time: 12/16/18  9:42 PM  Result Value Ref Range   Glucose-Capillary 109 (H) 70 - 99 mg/dL   Comment 1 Notify RN    Comment 2 Document in Chart   Basic metabolic panel     Status: Abnormal   Collection Time: 12/17/18  1:36 AM  Result Value Ref Range   Sodium 141 135 - 145 mmol/L   Potassium 3.9 3.5 - 5.1 mmol/L   Chloride 117 (H) 98 - 111 mmol/L   CO2 16 (L) 22 - 32 mmol/L   Glucose, Bld 158 (H) 70 - 99 mg/dL   BUN 23 8 - 23 mg/dL   Creatinine, Ser 1.83 (H) 0.44 - 1.00 mg/dL   Calcium 8.0 (L) 8.9 - 10.3 mg/dL   GFR calc non Af Amer 29 (L) >60 mL/min   GFR calc Af Amer 33 (L) >60 mL/min   Anion gap 8 5 - 15    Comment: Performed at [facility], [place] [address] [place], [place] [address]  CBC     Status: Abnormal   Collection Time: 12/17/18  1:36 AM  Result Value Ref Range   WBC 9.6 4.0 - 10.5 K/uL   RBC 2.16 (L) 3.87 - 5.11 MIL/uL   Hemoglobin 6.4 (LL) 12.0 - 15.0 g/dL    Comment: REPEATED TO VERIFY CRITICAL RESULT CALLED TO, READ BACK BY AND VERIFIED WITH: B,SCOTT AT 0158 ON 12/17/18 BY A,MOHAMED DELTA CHECK NOTED    HCT 20.9 (L) 36.0 -  46.0 %   MCV 96.8 80.0 - 100.0 fL   MCH 29.6 26.0 - 34.0 pg   MCHC 30.6 30.0 - 36.0 g/dL   RDW 15.7 (H) 11.5 - 15.5 %   Platelets 317 150 - 400 K/uL   nRBC 0.2 0.0 - 0.2 %    Comment: Performed at [facility], [place] [address], [place], [place] [address]  Prepare RBC     Status: None   Collection Time: 12/17/18  2:05 AM  Result Value Ref Range   Order Confirmation      ORDER PROCESSED BY BLOOD BANK Performed at [facility], [place] [address] [place], [place] [address]   Glucose, capillary     Status: None   Collection Time: 12/17/18  7:40 AM  Result Value Ref Range   Glucose-Capillary 97 70 - 99 mg/dL  Prepare RBC     Status: None   Collection Time: 12/17/18  8:19 AM  Result Value Ref Range   Order Confirmation      ORDER PROCESSED BY BLOOD BANK Performed at [place] [address], [place], [place] [address]   Glucose, capillary     Status: None   Collection Time: 12/17/18 11:18 AM  Result Value Ref Range   Glucose-Capillary 88 70 - 99 mg/dL  CBC     Status: Abnormal   Collection Time: 12/17/18 12:07 PM  Result Value Ref Range   WBC 9.9 4.0 - 10.5 K/uL   RBC 3.15 (L) 3.87 - 5.11 MIL/uL   Hemoglobin 9.4 (L) 12.0 - 15.0 g/dL    Comment: POST TRANSFUSION SPECIMEN   HCT 28.7 (L) 36.0 - 46.0 %   MCV 91.1 80.0 - 100.0 fL    Comment: POST TRANSFUSION SPECIMEN   MCH 29.8 26.0 - 34.0 pg   MCHC 32.8 30.0 - 36.0 g/dL   RDW 15.3 11.5 - 15.5 %   Platelets 279 150 - 400 K/uL   nRBC 0.6 (H) 0.0 - 0.2 %    Comment: Performed at [facility], [place] [address] [place], [place] [address]  Glucose, capillary     Status: None   Collection Time: 12/17/18  5:08 PM  Result Value Ref Range   Glucose-Capillary 85 70 - 99 mg/dL   Comment 1 Document in Chart   CBC     Status: Abnormal   Collection Time: 12/17/18  6:19 PM  Result Value Ref Range   WBC 9.5 4.0 - 10.5 K/uL   RBC 2.92 (L) 3.87 - 5.11 MIL/uL    Hemoglobin 8.6 (L) 12.0 - 15.0 g/dL   HCT 26.3 (L) 36.0 - 46.0 %   MCV 90.1 80.0 - 100.0 fL   MCH 29.5 26.0 - 34.0 pg   MCHC 32.7 30.0 - 36.0 g/dL   RDW 15.5 11.5 - 15.5 %   Platelets 274 150 - 400 K/uL   nRBC 0.5 (H) 0.0 - 0.2 %    Comment: Performed at [facility], [place] [address] [place], [place] [address]  Glucose, capillary     Status: None   Collection Time: 12/17/18  9:51 PM  Result Value Ref Range   Glucose-Capillary 90 70 - 99 mg/dL  CBC     Status: Abnormal   Collection Time: 12/18/18  1:38 AM  Result Value Ref Range   WBC 8.7 4.0 - 10.5 K/uL   RBC 2.85 (L) 3.87 - 5.11 MIL/uL   Hemoglobin 8.5 (L) 12.0 - 15.0 g/dL   HCT 25.9 (L) 36.0 - 46.0 %   MCV 90.9 80.0 - 100.0 fL   MCH 29.8 26.0 - 34.0 pg   MCHC 32.8 30.0 - 36.0 g/dL   RDW 15.6 (H) 11.5 - 15.5 %   Platelets 258 150 - 400 K/uL   nRBC 0.3 (H) 0.0 - 0.2 %    Comment: Performed at [facility], [place] [address], [place], [place] [address]  Glucose, capillary     Status: None   Collection Time: 12/18/18  7:35 AM  Result Value Ref Range   Glucose-Capillary 86 70 - 99 mg/dL    Studies/Results: No results found.  Medications: I have reviewed the patient's current medications.  Assessment: Post polypectomy bleeding with large and recurrent hematochezia. Multiple polyps were removed and patient had resumed Plavix a day after procedure, which is currently on hold. She received 2 units PRBC on 12/16/18 and 2 units on 12/17/18. Unprepped colonoscopy from 12/16/18 showed multiple blood clots throughout distal colon,with a large adherent fresh clot in mid transverse colon, likely site of bleeding.  Plan: Stat bleeding scan and recommend IR guided embolization if active bleeding is noted, as patient had multiple polypectomies and exact site of bleeding is unclear. Discussed with Dr.Watts from IR, Plavix has been on hold only for 3 days.She has not required pressors, not tachycardic and  her renal function is reduced. Will make the patient NPO. Plan 2 units PRBC transfusion now.   Ronnette Juniper 12/18/2018, 9:19 AM   Pager [phone] If no answer or after 5 PM call [phone]

## 2018-12-18 NOTE — Progress Notes (Signed)
Subjective:  Patient denies any chest pain or shortness of breath, had multiple small bloody BMs cardiac this morning.  Denies any abdominal pain. Hemoglobin from this a.m. is pending.  Scheduled for leading scan and possible IR guided embolization if active bleeding noted.  Objective:  Vital Signs in the last 24 hours: Temp:  [97.4 F (36.3 C)-99.3 F (37.4 C)] 98.1 F (36.7 C) (12/24 1015) Pulse Rate:  [68-87] 82 (12/24 0947) Resp:  [11-25] 13 (12/24 1015) BP: (137-208)/(46-105) 137/46 (12/24 1015) SpO2:  [96 %-100 %] 100 % (12/24 0900)  Intake/Output from previous day: 12/23 0701 - 12/24 0700 In: 1995.9 [P.O.:120; I.V.:1545.9; Blood:330] Out: 850 [Urine:850] Intake/Output from this shift: No intake/output data recorded.  Physical Exam: Neck: no adenopathy, no carotid bruit, no JVD and supple, symmetrical, trachea midline Lungs: clear to auscultation bilaterally Heart: regular rate and rhythm, S1, S2 normal and soft systolic murmur noted Abdomen: soft, non-tender; bowel sounds normal; no masses,  no organomegaly Extremities: extremities normal, atraumatic, no cyanosis or edema  Lab Results: Recent Labs    12/17/18 1819 12/18/18 0138  WBC 9.5 8.7  HGB 8.6* 8.5*  PLT 274 258   Recent Labs    12/16/18 1416 12/17/18 0136  NA 147* 141  K 4.2 3.9  CL 117* 117*  CO2 17* 16*  GLUCOSE 125* 158*  BUN 25* 23  CREATININE 1.79* 1.83*   No results for input(s): TROPONINI in the last 72 hours.  Invalid input(s): CK, MB Hepatic Function Panel Recent Labs    12/16/18 1416  PROT 5.7*  ALBUMIN 2.9*  AST 13*  ALT 9  ALKPHOS 150*  BILITOT 0.5   No results for input(s): CHOL in the last 72 hours. No results for input(s): PROTIME in the last 72 hours.  Imaging: Imaging results have been reviewed and No results found.  Cardiac Studies:  Assessment/Plan:  Recurrent acute lower GI bleeding status post recent colonoscopy/polypectomy status post colonoscopy  yesterday Acute blood loss anemia Status post hypotension secondary to above Coronary artery disease status post CABG/congenital valvular disease status post bioprosthetic aortic valve replacement Hypertension Diabetes mellitus Hyperlipidemia Chronic kidney disease stage II/III Tobacco abuse Depression Plan Monitor serial H&H. Agree with packed RBC transfusion. Check orthostatics. Scheduled for bleeding scan and possible embolization by IR if active bleeding noted  LOS: 2 days    Charolette Forward 12/18/2018, 11:14 AM

## 2018-12-18 NOTE — Consult Note (Signed)
Chief Complaint: Patient was seen in consultation today for GI bleed  Referring Physician(s): Dr. Ronnette Juniper  Supervising Physician: Sandi Mariscal  Patient Status: [facility] - In-pt  History of Present Illness: Renee Griffin is a 62 y.o. female with a past medical history significant for depression, panic attack, HTN, HLD, CAD s/p CABG 2011 on Plavix, CVA 2009, tobacco abuse, CKD II and DM who presented to [facility] ED on 12/16/18 with complaints of intermittent rectal bleeding and diarrhea following colonoscopy with Dr. Alessandra Bevels 10 days prior. She decided to present to the ED on that day due to a large bowel movement with a large amount of bright red blood. She continued to have several more large, bloody bowel movements with clots while in the ED. She was found to be hypotensive with hgb 6.8 on presentation - she was seen by GI and admitted for further evaluation and management. She has received 2 units PRBC on 12/22, 12/23 and 12/24. She underwent unprepped colonoscopy on 12/22 which showed multiple blood clots throughout the distal colon with a large adherent fresh clot in the mid transverse colon which was felt to be the likely site of bleeding. She underwent nuclear medicine GI bleeding scan today which was positive for acute lower GI bleed originating at the hepatic flexure of the colon. Imaging was reviewed by Dr. Pascal Lux and she was urgently transferred to IR for mesenteric arteriogram with potential intervention.   Past Medical History:  Diagnosis Date  . Depression   . Diabetes mellitus without complication ([place])   . Heart disease, congenital   . High cholesterol   . Hypertension   . Panic attack   . Stroke [facility])     Past Surgical History:  Procedure Laterality Date  . CARDIAC SURGERY  2011   CABG  . COLONOSCOPY WITH PROPOFOL N/A 02/08/2018   Procedure: COLONOSCOPY WITH PROPOFOL;  Surgeon: Otis Brace, MD;  Location: [facility];  Service: Gastroenterology;  Laterality: N/A;  .  COLONOSCOPY WITH PROPOFOL N/A 12/16/2018   Procedure: COLONOSCOPY WITH PROPOFOL;  Surgeon: Ronald Lobo, MD;  Location: [facility];  Service: Endoscopy;  Laterality: N/A;  . ESOPHAGOGASTRODUODENOSCOPY (EGD) WITH PROPOFOL Left 02/05/2018   Procedure: ESOPHAGOGASTRODUODENOSCOPY (EGD) WITH PROPOFOL;  Surgeon: Otis Brace, MD;  Location: [facility];  Service: Gastroenterology;  Laterality: Left;  . WISDOM TOOTH EXTRACTION      Allergies: Amoxicillin-pot clavulanate; Bupropion; and Nicotine  Medications: Prior to Admission medications   Medication Sig Start Date End Date Taking? Authorizing Provider  ALPRAZolam Duanne Moron) 0.5 MG tablet Take 0.25 mg by mouth 3 (three) times daily.  01/28/18  Yes [provider]  cholecalciferol (VITAMIN D3) 25 MCG (1000 UT) tablet Take 1,000 Units by mouth daily.   Yes [provider]  diltiazem (CARDIZEM) 90 MG tablet Take 1 tablet (90 mg total) by mouth 2 (two) times daily. 02/09/18  Yes Dixie Dials, MD  furosemide (LASIX) 20 MG tablet Take 1 tablet (20 mg total) by mouth every Monday, Wednesday, and Friday. Patient taking differently: Take 20 mg by mouth daily.  04/16/18  Yes Dixie Dials, MD  gemfibrozil (LOPID) 600 MG tablet Take 600 mg by mouth 2 (two) times daily. 02/19/18  Yes [provider]  metFORMIN (GLUCOPHAGE) 500 MG tablet Take 1 tablet (500 mg total) by mouth 2 (two) times daily. 04/15/18  Yes Dixie Dials, MD  prazosin (MINIPRESS) 1 MG capsule Take 1 capsule (1 mg total) by mouth 2 (two) times daily. 02/09/18  Yes Dixie Dials, MD  ACCU-CHEK AVIVA PLUS test strip See admin instructions. 01/29/16   [provider]  ACCU-CHEK SOFTCLIX LANCETS lancets See admin instructions. 01/30/16   [provider]  acetaminophen-codeine (TYLENOL #3) 300-30 MG per tablet Take 1-2 tablets by mouth daily as needed for moderate pain. Patient not taking: Reported on 12/16/2018 03/05/15   Rosalin Hawking, MD  Blood Glucose  Monitoring Suppl (ACCU-CHEK AVIVA PLUS) w/Device KIT See admin instructions. 01/29/16   [provider]  loperamide (IMODIUM) 2 MG capsule Take 1 capsule (2 mg total) by mouth 4 (four) times daily as needed for diarrhea or loose stools. 05/18/18   Kirichenko, Lahoma Rocker, PA-C  oxymetazoline (AFRIN) 0.05 % nasal spray Place 1 spray into both nostrils 2 (two) times daily.    [provider]  pantoprazole (PROTONIX) 40 MG tablet Take 1 tablet (40 mg total) by mouth 2 (two) times daily. Patient taking differently: Take 40 mg by mouth 2 (two) times daily as needed (indigestion).  02/09/18   Dixie Dials, MD  valACYclovir (VALTREX) 500 MG tablet Take 500 mg by mouth daily.    [provider]     Family History  Problem Relation Age of Onset  . Cancer Mother        uterian OR cervical  . Cancer Father        lung  . Leukemia Brother   . Cancer Sister        breast    Social History   Socioeconomic History  . Marital status: Widowed    Spouse name: Not on file  . Number of children: Not on file  . Years of education: Not on file  . Highest education level: Not on file  Occupational History  . Not on file  Social Needs  . Financial resource strain: Not on file  . Food insecurity:    Worry: Not on file    Inability: Not on file  . Transportation needs:    Medical: Not on file    Non-medical: Not on file  Tobacco Use  . Smoking status: Current Every Day Smoker    Packs/day: 1.00    Years: 50.00    Pack years: 50.00    Types: Cigarettes  . Smokeless tobacco: Never Used  Substance and Sexual Activity  . Alcohol use: No    Alcohol/week: 0.0 standard drinks  . Drug use: No  . Sexual activity: Never  Lifestyle  . Physical activity:    Days per week: Not on file    Minutes per session: Not on file  . Stress: Not on file  Relationships  . Social connections:    Talks on phone: Not on file    Gets together: Not on file    Attends religious service: Not on  file    Active member of club or organization: Not on file    Attends meetings of clubs or organizations: Not on file    Relationship status: Not on file  Other Topics Concern  . Not on file  Social History Narrative  . Not on file     Review of Systems: A 12 point ROS discussed and pertinent positives are indicated in the HPI above.  All other systems are negative.  Review of Systems  Unable to perform ROS: Acuity of condition    Vital Signs: BP (!) 119/56   Pulse 83   Temp 98.1 F (36.7 C)   Resp 15   Ht '5\' 3"'  (1.6 m)   Wt 160 lb (72.6  kg)   SpO2 100%   BMI 28.34 kg/m   Physical Exam Vitals signs and nursing note reviewed.  HENT:     Head: Normocephalic and atraumatic.  Cardiovascular:     Rate and Rhythm: Normal rate.  Pulmonary:     Effort: Pulmonary effort is normal.  Skin:    General: Skin is warm and dry.     Coloration: Skin is pale.  Neurological:     Mental Status: She is alert. Mental status is at baseline.     MD Evaluation Airway: WNL Heart: WNL Abdomen: WNL Chest/ Lungs: WNL ASA  Classification: 3 Mallampati/Airway Score: Two   Imaging: No results found.  Labs:  CBC: Recent Labs    12/17/18 0136 12/17/18 1207 12/17/18 1819 12/18/18 0138  WBC 9.6 9.9 9.5 8.7  HGB 6.4* 9.4* 8.6* 8.5*  HCT 20.9* 28.7* 26.3* 25.9*  PLT 317 279 274 258    COAGS: Recent Labs    12/16/18 1416  INR 1.09    BMP: Recent Labs    04/15/18 1212 05/18/18 1028 12/16/18 1408 12/16/18 1416 12/17/18 0136  NA 139 141 143 147* 141  K 3.9 3.7 4.2 4.2 3.9  CL 109 105 117* 117* 117*  CO2 22 19*  --  17* 16*  GLUCOSE 120* 77 119* 125* 158*  BUN 17 26* 25* 25* 23  CALCIUM 9.4 10.9*  --  9.0 8.0*  CREATININE 1.43* 2.00* 1.80* 1.79* 1.83*  GFRNONAA 38* 26*  --  30* 29*  GFRAA 44* 30*  --  34* 33*    LIVER FUNCTION TESTS: Recent Labs    02/23/18 0846 04/13/18 1608 05/18/18 1028 12/16/18 1416  BILITOT 0.8 0.8 0.9 0.5  AST '18 18 22 ' 13*  ALT  10* 10* 13* 9  ALKPHOS 135* 165* 148* 150*  PROT 7.0 7.0 7.2 5.7*  ALBUMIN 3.4* 3.5 3.6 2.9*    TUMOR MARKERS: No results for input(s): AFPTM, CEA, CA199, CHROMGRNA in the last 8760 hours.  Assessment and Plan:  Patient with colonoscopy 12/12 with 10 polyps removed by Dr. Alessandra Bevels who presented to [facility] ED on 12/22 with rectal bleeding and subsequently had several large bowel movements with bright red blood and clots. Hgb 6.8 on admission - she has received 2 units PRBCs on 12/22, 12/23 and 12/24. Unprepped colonoscopy on 12/22 showing multiple blood clots throughout the distal colon with a large adherent fresh clot in the mid transverse colon which was felt to be the likely site of bleeding. She underwent nuclear medicine GI bleeding scan today which was positive for acute lower GI bleed originating at the hepatic flexure of the colon. Imaging was reviewed by Dr. Pascal Lux and she was urgently transferred to IR for mesenteric arteriogram with potential intervention.   NPO since 9 am today, Plavix has been held for 3 days. Afebrile, WBC 8.7, hgb 8.5, plt 258, INR 1.09 (12/22).  Risks and benefits of mesenteric angiogram with intervention were discussed with the patient including, but not limited to bleeding, infection, vascular injury, contrast induced renal failure, stroke or even death.  This interventional procedure involves the use of X-rays and because of the nature of the planned procedure, it is possible that we will have prolonged use of X-ray fluoroscopy. Potential radiation risks to you include (but are not limited to) the following: - A slightly elevated risk for cancer  several years later in life. This risk is typically less than 0.5% percent. This risk is low in comparison to the normal  incidence of human cancer, which is 33% for women and 50% for men according to the [place]. - Radiation induced injury can include skin redness, resembling a rash, tissue breakdown /  ulcers and hair loss (which can be temporary or permanent).  The likelihood of either of these occurring depends on the difficulty of the procedure and whether you are sensitive to radiation due to previous procedures, disease, or genetic conditions.  IF your procedure requires a prolonged use of radiation, you will be notified and given written instructions for further action.  It is your responsibility to monitor the irradiated area for the 2 weeks following the procedure and to notify your physician if you are concerned that you have suffered a radiation induced injury.    All of the patient's questions were answered, patient is agreeable to proceed.  Consent signed and in chart.  Thank you for this interesting consult.  I greatly enjoyed meeting Renee Griffin and look forward to participating in their care.  A copy of this report was sent to the requesting provider on this date.  Electronically Signed: Joaquim Nam, PA-C 12/18/2018, 12:13 PM   I spent a total of 55 Miinutes in face to face in clinical consultation, greater than 50% of which was counseling/coordinating care for mesenteric angiogram with possible intervention.

## 2018-12-18 NOTE — Anesthesia Postprocedure Evaluation (Addendum)
Anesthesia Post Note  Patient: Renee Griffin  Procedure(s) Performed: COLONOSCOPY WITH PROPOFOL (N/A )     Patient location during evaluation: Endoscopy Anesthesia Type: MAC Level of consciousness: awake Pain management: pain level controlled Vital Signs Assessment: post-procedure vital signs reviewed and stable Respiratory status: spontaneous breathing Cardiovascular status: stable Postop Assessment: no apparent nausea or vomiting Anesthetic complications: no    Last Vitals:  Vitals:   12/18/18 1945 12/18/18 2000  BP:  (!) 167/79  Pulse:  95  Resp:  12  Temp: 36.7 C   SpO2:  99%    Last Pain:  Vitals:   12/18/18 2000  TempSrc:   PainSc: Asleep   Pain Goal:                 Huston Foley

## 2018-12-19 LAB — TYPE AND SCREEN
ABO/RH(D): O POS
Antibody Screen: NEGATIVE
UNIT DIVISION: 0
Unit division: 0
Unit division: 0
Unit division: 0
Unit division: 0
Unit division: 0

## 2018-12-19 LAB — BPAM RBC
BLOOD PRODUCT EXPIRATION DATE: 202001182359
Blood Product Expiration Date: 202001172359
Blood Product Expiration Date: 202001182359
Blood Product Expiration Date: 202001182359
Blood Product Expiration Date: 202001182359
Blood Product Expiration Date: 202001202359
ISSUE DATE / TIME: 201912221544
ISSUE DATE / TIME: 201912221701
ISSUE DATE / TIME: 201912230911
ISSUE DATE / TIME: 201912240949
ISSUE DATE / TIME: 201912241203
ISSUE DATE / TIME: 201912230344
UNIT TYPE AND RH: 5100
UNIT TYPE AND RH: 5100
Unit Type and Rh: 5100
Unit Type and Rh: 5100
Unit Type and Rh: 5100
Unit Type and Rh: 5100

## 2018-12-19 LAB — GLUCOSE, CAPILLARY
Glucose-Capillary: 71 mg/dL (ref 70–99)
Glucose-Capillary: 133 mg/dL — ABNORMAL HIGH (ref 70–99)
Glucose-Capillary: 65 mg/dL — ABNORMAL LOW (ref 70–99)
Glucose-Capillary: 131 mg/dL — ABNORMAL HIGH (ref 70–99)
Glucose-Capillary: 65 mg/dL — ABNORMAL LOW (ref 70–99)
Glucose-Capillary: 71 mg/dL (ref 70–99)
Glucose-Capillary: 99 mg/dL (ref 70–99)
Glucose-Capillary: 99 mg/dL (ref 70–99)

## 2018-12-19 LAB — HEMOGLOBIN AND HEMATOCRIT, BLOOD
HCT: 23.8 % — ABNORMAL LOW (ref 36.0–46.0)
HEMATOCRIT: 25 % — AB (ref 36.0–46.0)
HEMOGLOBIN: 8.1 g/dL — AB (ref 12.0–15.0)
Hemoglobin: 7.8 g/dL — ABNORMAL LOW (ref 12.0–15.0)

## 2018-12-19 MED ORDER — METOPROLOL TARTRATE 25 MG PO TABS
12.5000 mg | ORAL_TABLET | Freq: Two times a day (BID) | ORAL | Status: DC
  Administered 2018-12-19 – 2018-12-25 (×13): 12.5 mg via ORAL
  Filled 2018-12-19 (×13): qty 1

## 2018-12-19 MED ORDER — DEXTROSE 50 % IV SOLN
INTRAVENOUS | Status: AC
  Filled 2018-12-19: qty 50

## 2018-12-19 MED ORDER — DEXTROSE 50 % IV SOLN
25.0000 mL | Freq: Once | INTRAVENOUS | Status: AC
  Administered 2018-12-19: 25 mL via INTRAVENOUS

## 2018-12-19 NOTE — Progress Notes (Signed)
Pt with episode of passing a golf ball-sized clot with blood seen by nurse tech. Dr. Therisa Doyne made aware. Orders received to obtain stat H&H and to transfuse 2 units of PRBC's if hgb <7. Instructed by Dr. Therisa Doyne to call IR. Dr. Annamaria Boots with IR made aware. No additional orders at this time. RN instructed to continue to monitor and follow H&H.

## 2018-12-19 NOTE — Progress Notes (Signed)
Subjective:  Appreciate GI and interventional radiology consult and help.  Patient successfully underwent mesenteric angiogram and followed by coiling of the branch of the right hepatic artery.  Patient denies any abdominal pain.  States overall feels little better hemoglobin has been stable.  No further episodes of bleeding.  Denies any chest pain or shortness of breath.  Objective:  Vital Signs in the last 24 hours: Temp:  [97.8 F (36.6 C)-98.9 F (37.2 C)] 98.4 F (36.9 C) (12/25 0800) Pulse Rate:  [71-95] 84 (12/25 0800) Resp:  [11-28] 22 (12/25 0900) BP: (109-206)/(44-135) 161/60 (12/25 0900) SpO2:  [96 %-100 %] 99 % (12/25 0800)  Intake/Output from previous day: 12/24 0701 - 12/25 0700 In: 825 [I.V.:173.8; Blood:651.3] Out: 400 [Urine:400] Intake/Output from this shift: Total I/O In: 45.2 [I.V.:45.2] Out: -   Physical Exam: Neck: no adenopathy, no carotid bruit, no JVD and supple, symmetrical, trachea midline Lungs: clear to auscultation bilaterally Heart: regular rate and rhythm, S1, S2 normal and Soft systolic murmur noted no S3 gallop Abdomen: soft, non-tender; bowel sounds normal; no masses,  no organomegaly Extremities: extremities normal, atraumatic, no cyanosis or edema  Lab Results: Recent Labs    12/18/18 0138 12/18/18 1627  WBC 8.7 14.6*  HGB 8.5* 10.1*  PLT 258 266   Recent Labs    12/16/18 1416 12/17/18 0136  NA 147* 141  K 4.2 3.9  CL 117* 117*  CO2 17* 16*  GLUCOSE 125* 158*  BUN 25* 23  CREATININE 1.79* 1.83*   No results for input(s): TROPONINI in the last 72 hours.  Invalid input(s): CK, MB Hepatic Function Panel Recent Labs    12/16/18 1416  PROT 5.7*  ALBUMIN 2.9*  AST 13*  ALT 9  ALKPHOS 150*  BILITOT 0.5   No results for input(s): CHOL in the last 72 hours. No results for input(s): PROTIME in the last 72 hours.  Imaging: Imaging results have been reviewed and Nm Gi Blood Loss  Result Date: 12/18/2018 CLINICAL DATA:   History of recent colonoscopy and multiple polypectomy (by report, greater than 10 polyps were removed) with subsequent recurrent lower GI bleeding following the re-initiation of Plavix. EXAM: NUCLEAR MEDICINE GASTROINTESTINAL BLEEDING SCAN TECHNIQUE: Sequential abdominal images were obtained following intravenous administration of Tc-64mlabeled red blood cells. RADIOPHARMACEUTICALS:  25 mCi Tc-971mertechnetate in-vitro labeled red cells. COMPARISON:  CT abdomen pelvis-02/02/2028 FINDINGS: There is abnormal intraluminal radiotracer activity which appears to originate at the level of the hepatic flexure colon and transit through the transverse and distal sigmoid colon Physiologic activity is seen within the heart, liver, spleen and major intra-abdominal vascular structures. IMPRESSION: Examination is positive for acute lower GI bleed which appears to originate at the level of the hepatic flexure of the colon. PLAN: Patient was emergently transferred to interventional radiology for mesenteric arteriogram and potential intervention. Electronically Signed   By: JoSandi Mariscal.D.   On: 12/18/2018 12:35   Ir Angiogram Visceral Selective  Result Date: 12/18/2018 INDICATION: History of recent colonoscopy and polypectomy (reportedly at least 10 polyps removed), now with clinically significant lower GI bleeding following postprocedural re-initiation of Plavix. Patient with preceding positive tagged red blood cell study with expected location located within the hepatic flexure of the colon. Patient with baseline chronic renal insufficiency. EXAM: 1. ULTRASOUND GUIDANCE FOR ARTERIAL ACCESS. 2. SELECTIVE SUPERIOR MESENTERIC ARTERIOGRAM 3. SELECTIVE RIGHT COLIC ARTERIOGRAM 4. SELECTIVE ARTERIOGRAM OF TERTIARY BRANCH VESSEL THE RIGHT COLIC ARTERY AND PERCUTANEOUS COIL EMBOLIZATION. MEDICATIONS: NONE ANESTHESIA/SEDATION: Moderate (conscious) sedation  was employed during this procedure. A total of Versed 3 mg and Fentanyl 100  mcg was administered intravenously. Moderate Sedation Time: 44 minutes. The patient's level of consciousness and vital signs were monitored continuously by radiology nursing throughout the procedure under my direct supervision. CONTRAST:  40 cc Visipaque 320 FLUOROSCOPY TIME:  11 minutes, 48 seconds (625 mGy) COMPLICATIONS: None immediate. PROCEDURE: Informed consent was obtained from the patient following explanation of the procedure, risks, benefits and alternatives. The patient understands, agrees and consents for the procedure. All questions were addressed. A time out was performed prior to the initiation of the procedure. Maximal barrier sterile technique utilized including caps, mask, sterile gowns, sterile gloves, large sterile drape, hand hygiene, and Betadine prep. The right femoral head was marked fluoroscopically. Under ultrasound guidance, the right common femoral artery was accessed with a micropuncture kit after the overlying soft tissues were anesthetized with 1% lidocaine. An ultrasound image was saved for documentation purposes. The micropuncture sheath was exchanged for a 5 [place] vascular sheath over a Bentson wire. A closure arteriogram was performed through the side of the sheath confirming access within the right common femoral artery. Over a Bentson wire, a Mickelson catheter was advanced to the level of the thoracic aorta where it was back bled and flushed. The catheter was then utilized to select the superior mesenteric artery and a selective superior mesenteric arteriogram was performed Next, with the use of a fathom 14 microwire, a regular Renegade microcatheter was advanced into the origin of the right colic artery and a selective right colic arteriogram was performed. Again with the use of a fathom 14 microwire, the regular Renegade was advanced into a tertiary branch of the right colic artery a sub selective arteriogram was performed. The tertiary branch of the right colic artery  supplying the location of focal extravasation into the hepatic flexure of the colon was coil embolized with 2 overlapping 2 mm x 2 cm interlock coils. The microcatheter was retracted into the more central aspect of the right colic artery and a sub selective arteriogram was performed. The microcatheter was removed and a completion superior mesenteric arteriogram was performed. Images were reviewed and the procedure was terminated. All wires, catheters and sheaths were removed from the patient. Superficial hemostasis was achieved at the right groin access site with manual compression. A dressing was placed. The patient tolerated the procedure well without immediate postprocedural complication. FINDINGS: Selective superior mesenteric arteriogram demonstrates a focal area of contrast extravasation involving a distal tertiary branch of the right colic artery, correlating with the area of suspected active extravasation seen on preceding nuclear medicine tagged red blood cell study. Selective right colic arteriogram confirms this finding. The tertiary branch of the right colic artery was successfully catheterized with sub selective injection confirming persistent extravasation. As such, the vessel was coil embolized with 2 mm diameter interlock coils. Completion selective arteriogram is negative for residual extravasation at this location confirmed with completion superior mesenteric arteriogram. More exhaustive mesenteric arteriogram was not performed this time given correlation between the angiographic findings and the preceding tagged nuclear medicine bleeding study in light of the patient's chronic renal insufficiency. IMPRESSION: Technically successful coil embolization of extravasating tertiary branch vessel of the right colic artery correlating with the findings seen on preceding nuclear medicine tagged red blood cell bleeding scan. Electronically Signed   By: Sandi Mariscal M.D.   On: 12/18/2018 17:31   Ir Angiogram  Selective Each Additional Vessel  Result Date: 12/18/2018 INDICATION: History of recent  colonoscopy and polypectomy (reportedly at least 10 polyps removed), now with clinically significant lower GI bleeding following postprocedural re-initiation of Plavix. Patient with preceding positive tagged red blood cell study with expected location located within the hepatic flexure of the colon. Patient with baseline chronic renal insufficiency. EXAM: 1. ULTRASOUND GUIDANCE FOR ARTERIAL ACCESS. 2. SELECTIVE SUPERIOR MESENTERIC ARTERIOGRAM 3. SELECTIVE RIGHT COLIC ARTERIOGRAM 4. SELECTIVE ARTERIOGRAM OF TERTIARY BRANCH VESSEL THE RIGHT COLIC ARTERY AND PERCUTANEOUS COIL EMBOLIZATION. MEDICATIONS: NONE ANESTHESIA/SEDATION: Moderate (conscious) sedation was employed during this procedure. A total of Versed 3 mg and Fentanyl 100 mcg was administered intravenously. Moderate Sedation Time: 44 minutes. The patient's level of consciousness and vital signs were monitored continuously by radiology nursing throughout the procedure under my direct supervision. CONTRAST:  40 cc Visipaque 320 FLUOROSCOPY TIME:  11 minutes, 48 seconds (638 mGy) COMPLICATIONS: None immediate. PROCEDURE: Informed consent was obtained from the patient following explanation of the procedure, risks, benefits and alternatives. The patient understands, agrees and consents for the procedure. All questions were addressed. A time out was performed prior to the initiation of the procedure. Maximal barrier sterile technique utilized including caps, mask, sterile gowns, sterile gloves, large sterile drape, hand hygiene, and Betadine prep. The right femoral head was marked fluoroscopically. Under ultrasound guidance, the right common femoral artery was accessed with a micropuncture kit after the overlying soft tissues were anesthetized with 1% lidocaine. An ultrasound image was saved for documentation purposes. The micropuncture sheath was exchanged for a 5 [place]  vascular sheath over a Bentson wire. A closure arteriogram was performed through the side of the sheath confirming access within the right common femoral artery. Over a Bentson wire, a Mickelson catheter was advanced to the level of the thoracic aorta where it was back bled and flushed. The catheter was then utilized to select the superior mesenteric artery and a selective superior mesenteric arteriogram was performed Next, with the use of a fathom 14 microwire, a regular Renegade microcatheter was advanced into the origin of the right colic artery and a selective right colic arteriogram was performed. Again with the use of a fathom 14 microwire, the regular Renegade was advanced into a tertiary branch of the right colic artery a sub selective arteriogram was performed. The tertiary branch of the right colic artery supplying the location of focal extravasation into the hepatic flexure of the colon was coil embolized with 2 overlapping 2 mm x 2 cm interlock coils. The microcatheter was retracted into the more central aspect of the right colic artery and a sub selective arteriogram was performed. The microcatheter was removed and a completion superior mesenteric arteriogram was performed. Images were reviewed and the procedure was terminated. All wires, catheters and sheaths were removed from the patient. Superficial hemostasis was achieved at the right groin access site with manual compression. A dressing was placed. The patient tolerated the procedure well without immediate postprocedural complication. FINDINGS: Selective superior mesenteric arteriogram demonstrates a focal area of contrast extravasation involving a distal tertiary branch of the right colic artery, correlating with the area of suspected active extravasation seen on preceding nuclear medicine tagged red blood cell study. Selective right colic arteriogram confirms this finding. The tertiary branch of the right colic artery was successfully  catheterized with sub selective injection confirming persistent extravasation. As such, the vessel was coil embolized with 2 mm diameter interlock coils. Completion selective arteriogram is negative for residual extravasation at this location confirmed with completion superior mesenteric arteriogram. More exhaustive mesenteric arteriogram was not performed  this time given correlation between the angiographic findings and the preceding tagged nuclear medicine bleeding study in light of the patient's chronic renal insufficiency. IMPRESSION: Technically successful coil embolization of extravasating tertiary branch vessel of the right colic artery correlating with the findings seen on preceding nuclear medicine tagged red blood cell bleeding scan. Electronically Signed   By: Sandi Mariscal M.D.   On: 12/18/2018 17:31   Ir Angiogram Selective Each Additional Vessel  Result Date: 12/18/2018 INDICATION: History of recent colonoscopy and polypectomy (reportedly at least 10 polyps removed), now with clinically significant lower GI bleeding following postprocedural re-initiation of Plavix. Patient with preceding positive tagged red blood cell study with expected location located within the hepatic flexure of the colon. Patient with baseline chronic renal insufficiency. EXAM: 1. ULTRASOUND GUIDANCE FOR ARTERIAL ACCESS. 2. SELECTIVE SUPERIOR MESENTERIC ARTERIOGRAM 3. SELECTIVE RIGHT COLIC ARTERIOGRAM 4. SELECTIVE ARTERIOGRAM OF TERTIARY BRANCH VESSEL THE RIGHT COLIC ARTERY AND PERCUTANEOUS COIL EMBOLIZATION. MEDICATIONS: NONE ANESTHESIA/SEDATION: Moderate (conscious) sedation was employed during this procedure. A total of Versed 3 mg and Fentanyl 100 mcg was administered intravenously. Moderate Sedation Time: 44 minutes. The patient's level of consciousness and vital signs were monitored continuously by radiology nursing throughout the procedure under my direct supervision. CONTRAST:  40 cc Visipaque 320 FLUOROSCOPY TIME:  11  minutes, 48 seconds (973 mGy) COMPLICATIONS: None immediate. PROCEDURE: Informed consent was obtained from the patient following explanation of the procedure, risks, benefits and alternatives. The patient understands, agrees and consents for the procedure. All questions were addressed. A time out was performed prior to the initiation of the procedure. Maximal barrier sterile technique utilized including caps, mask, sterile gowns, sterile gloves, large sterile drape, hand hygiene, and Betadine prep. The right femoral head was marked fluoroscopically. Under ultrasound guidance, the right common femoral artery was accessed with a micropuncture kit after the overlying soft tissues were anesthetized with 1% lidocaine. An ultrasound image was saved for documentation purposes. The micropuncture sheath was exchanged for a 5 [place] vascular sheath over a Bentson wire. A closure arteriogram was performed through the side of the sheath confirming access within the right common femoral artery. Over a Bentson wire, a Mickelson catheter was advanced to the level of the thoracic aorta where it was back bled and flushed. The catheter was then utilized to select the superior mesenteric artery and a selective superior mesenteric arteriogram was performed Next, with the use of a fathom 14 microwire, a regular Renegade microcatheter was advanced into the origin of the right colic artery and a selective right colic arteriogram was performed. Again with the use of a fathom 14 microwire, the regular Renegade was advanced into a tertiary branch of the right colic artery a sub selective arteriogram was performed. The tertiary branch of the right colic artery supplying the location of focal extravasation into the hepatic flexure of the colon was coil embolized with 2 overlapping 2 mm x 2 cm interlock coils. The microcatheter was retracted into the more central aspect of the right colic artery and a sub selective arteriogram was performed.  The microcatheter was removed and a completion superior mesenteric arteriogram was performed. Images were reviewed and the procedure was terminated. All wires, catheters and sheaths were removed from the patient. Superficial hemostasis was achieved at the right groin access site with manual compression. A dressing was placed. The patient tolerated the procedure well without immediate postprocedural complication. FINDINGS: Selective superior mesenteric arteriogram demonstrates a focal area of contrast extravasation involving a distal tertiary branch of  the right colic artery, correlating with the area of suspected active extravasation seen on preceding nuclear medicine tagged red blood cell study. Selective right colic arteriogram confirms this finding. The tertiary branch of the right colic artery was successfully catheterized with sub selective injection confirming persistent extravasation. As such, the vessel was coil embolized with 2 mm diameter interlock coils. Completion selective arteriogram is negative for residual extravasation at this location confirmed with completion superior mesenteric arteriogram. More exhaustive mesenteric arteriogram was not performed this time given correlation between the angiographic findings and the preceding tagged nuclear medicine bleeding study in light of the patient's chronic renal insufficiency. IMPRESSION: Technically successful coil embolization of extravasating tertiary branch vessel of the right colic artery correlating with the findings seen on preceding nuclear medicine tagged red blood cell bleeding scan. Electronically Signed   By: Sandi Mariscal M.D.   On: 12/18/2018 17:31   Ir Angiogram Selective Each Additional Vessel  Result Date: 12/18/2018 INDICATION: History of recent colonoscopy and polypectomy (reportedly at least 10 polyps removed), now with clinically significant lower GI bleeding following postprocedural re-initiation of Plavix. Patient with preceding  positive tagged red blood cell study with expected location located within the hepatic flexure of the colon. Patient with baseline chronic renal insufficiency. EXAM: 1. ULTRASOUND GUIDANCE FOR ARTERIAL ACCESS. 2. SELECTIVE SUPERIOR MESENTERIC ARTERIOGRAM 3. SELECTIVE RIGHT COLIC ARTERIOGRAM 4. SELECTIVE ARTERIOGRAM OF TERTIARY BRANCH VESSEL THE RIGHT COLIC ARTERY AND PERCUTANEOUS COIL EMBOLIZATION. MEDICATIONS: NONE ANESTHESIA/SEDATION: Moderate (conscious) sedation was employed during this procedure. A total of Versed 3 mg and Fentanyl 100 mcg was administered intravenously. Moderate Sedation Time: 44 minutes. The patient's level of consciousness and vital signs were monitored continuously by radiology nursing throughout the procedure under my direct supervision. CONTRAST:  40 cc Visipaque 320 FLUOROSCOPY TIME:  11 minutes, 48 seconds (106 mGy) COMPLICATIONS: None immediate. PROCEDURE: Informed consent was obtained from the patient following explanation of the procedure, risks, benefits and alternatives. The patient understands, agrees and consents for the procedure. All questions were addressed. A time out was performed prior to the initiation of the procedure. Maximal barrier sterile technique utilized including caps, mask, sterile gowns, sterile gloves, large sterile drape, hand hygiene, and Betadine prep. The right femoral head was marked fluoroscopically. Under ultrasound guidance, the right common femoral artery was accessed with a micropuncture kit after the overlying soft tissues were anesthetized with 1% lidocaine. An ultrasound image was saved for documentation purposes. The micropuncture sheath was exchanged for a 5 [place] vascular sheath over a Bentson wire. A closure arteriogram was performed through the side of the sheath confirming access within the right common femoral artery. Over a Bentson wire, a Mickelson catheter was advanced to the level of the thoracic aorta where it was back bled and  flushed. The catheter was then utilized to select the superior mesenteric artery and a selective superior mesenteric arteriogram was performed Next, with the use of a fathom 14 microwire, a regular Renegade microcatheter was advanced into the origin of the right colic artery and a selective right colic arteriogram was performed. Again with the use of a fathom 14 microwire, the regular Renegade was advanced into a tertiary branch of the right colic artery a sub selective arteriogram was performed. The tertiary branch of the right colic artery supplying the location of focal extravasation into the hepatic flexure of the colon was coil embolized with 2 overlapping 2 mm x 2 cm interlock coils. The microcatheter was retracted into the more central aspect of the right  colic artery and a sub selective arteriogram was performed. The microcatheter was removed and a completion superior mesenteric arteriogram was performed. Images were reviewed and the procedure was terminated. All wires, catheters and sheaths were removed from the patient. Superficial hemostasis was achieved at the right groin access site with manual compression. A dressing was placed. The patient tolerated the procedure well without immediate postprocedural complication. FINDINGS: Selective superior mesenteric arteriogram demonstrates a focal area of contrast extravasation involving a distal tertiary branch of the right colic artery, correlating with the area of suspected active extravasation seen on preceding nuclear medicine tagged red blood cell study. Selective right colic arteriogram confirms this finding. The tertiary branch of the right colic artery was successfully catheterized with sub selective injection confirming persistent extravasation. As such, the vessel was coil embolized with 2 mm diameter interlock coils. Completion selective arteriogram is negative for residual extravasation at this location confirmed with completion superior mesenteric  arteriogram. More exhaustive mesenteric arteriogram was not performed this time given correlation between the angiographic findings and the preceding tagged nuclear medicine bleeding study in light of the patient's chronic renal insufficiency. IMPRESSION: Technically successful coil embolization of extravasating tertiary branch vessel of the right colic artery correlating with the findings seen on preceding nuclear medicine tagged red blood cell bleeding scan. Electronically Signed   By: Sandi Mariscal M.D.   On: 12/18/2018 17:31   Ir Angiogram Selective Each Additional Vessel  Result Date: 12/18/2018 INDICATION: History of recent colonoscopy and polypectomy (reportedly at least 10 polyps removed), now with clinically significant lower GI bleeding following postprocedural re-initiation of Plavix. Patient with preceding positive tagged red blood cell study with expected location located within the hepatic flexure of the colon. Patient with baseline chronic renal insufficiency. EXAM: 1. ULTRASOUND GUIDANCE FOR ARTERIAL ACCESS. 2. SELECTIVE SUPERIOR MESENTERIC ARTERIOGRAM 3. SELECTIVE RIGHT COLIC ARTERIOGRAM 4. SELECTIVE ARTERIOGRAM OF TERTIARY BRANCH VESSEL THE RIGHT COLIC ARTERY AND PERCUTANEOUS COIL EMBOLIZATION. MEDICATIONS: NONE ANESTHESIA/SEDATION: Moderate (conscious) sedation was employed during this procedure. A total of Versed 3 mg and Fentanyl 100 mcg was administered intravenously. Moderate Sedation Time: 44 minutes. The patient's level of consciousness and vital signs were monitored continuously by radiology nursing throughout the procedure under my direct supervision. CONTRAST:  40 cc Visipaque 320 FLUOROSCOPY TIME:  11 minutes, 48 seconds (680 mGy) COMPLICATIONS: None immediate. PROCEDURE: Informed consent was obtained from the patient following explanation of the procedure, risks, benefits and alternatives. The patient understands, agrees and consents for the procedure. All questions were addressed. A  time out was performed prior to the initiation of the procedure. Maximal barrier sterile technique utilized including caps, mask, sterile gowns, sterile gloves, large sterile drape, hand hygiene, and Betadine prep. The right femoral head was marked fluoroscopically. Under ultrasound guidance, the right common femoral artery was accessed with a micropuncture kit after the overlying soft tissues were anesthetized with 1% lidocaine. An ultrasound image was saved for documentation purposes. The micropuncture sheath was exchanged for a 5 [place] vascular sheath over a Bentson wire. A closure arteriogram was performed through the side of the sheath confirming access within the right common femoral artery. Over a Bentson wire, a Mickelson catheter was advanced to the level of the thoracic aorta where it was back bled and flushed. The catheter was then utilized to select the superior mesenteric artery and a selective superior mesenteric arteriogram was performed Next, with the use of a fathom 14 microwire, a regular Renegade microcatheter was advanced into the origin of the right colic  artery and a selective right colic arteriogram was performed. Again with the use of a fathom 14 microwire, the regular Renegade was advanced into a tertiary branch of the right colic artery a sub selective arteriogram was performed. The tertiary branch of the right colic artery supplying the location of focal extravasation into the hepatic flexure of the colon was coil embolized with 2 overlapping 2 mm x 2 cm interlock coils. The microcatheter was retracted into the more central aspect of the right colic artery and a sub selective arteriogram was performed. The microcatheter was removed and a completion superior mesenteric arteriogram was performed. Images were reviewed and the procedure was terminated. All wires, catheters and sheaths were removed from the patient. Superficial hemostasis was achieved at the right groin access site with manual  compression. A dressing was placed. The patient tolerated the procedure well without immediate postprocedural complication. FINDINGS: Selective superior mesenteric arteriogram demonstrates a focal area of contrast extravasation involving a distal tertiary branch of the right colic artery, correlating with the area of suspected active extravasation seen on preceding nuclear medicine tagged red blood cell study. Selective right colic arteriogram confirms this finding. The tertiary branch of the right colic artery was successfully catheterized with sub selective injection confirming persistent extravasation. As such, the vessel was coil embolized with 2 mm diameter interlock coils. Completion selective arteriogram is negative for residual extravasation at this location confirmed with completion superior mesenteric arteriogram. More exhaustive mesenteric arteriogram was not performed this time given correlation between the angiographic findings and the preceding tagged nuclear medicine bleeding study in light of the patient's chronic renal insufficiency. IMPRESSION: Technically successful coil embolization of extravasating tertiary branch vessel of the right colic artery correlating with the findings seen on preceding nuclear medicine tagged red blood cell bleeding scan. Electronically Signed   By: Sandi Mariscal M.D.   On: 12/18/2018 17:31   [facility] US Guide Vasc Access Right  Result Date: 12/18/2018 INDICATION: History of recent colonoscopy and polypectomy (reportedly at least 10 polyps removed), now with clinically significant lower GI bleeding following postprocedural re-initiation of Plavix. Patient with preceding positive tagged red blood cell study with expected location located within the hepatic flexure of the colon. Patient with baseline chronic renal insufficiency. EXAM: 1. ULTRASOUND GUIDANCE FOR ARTERIAL ACCESS. 2. SELECTIVE SUPERIOR MESENTERIC ARTERIOGRAM 3. SELECTIVE RIGHT COLIC ARTERIOGRAM 4. SELECTIVE  ARTERIOGRAM OF TERTIARY BRANCH VESSEL THE RIGHT COLIC ARTERY AND PERCUTANEOUS COIL EMBOLIZATION. MEDICATIONS: NONE ANESTHESIA/SEDATION: Moderate (conscious) sedation was employed during this procedure. A total of Versed 3 mg and Fentanyl 100 mcg was administered intravenously. Moderate Sedation Time: 44 minutes. The patient's level of consciousness and vital signs were monitored continuously by radiology nursing throughout the procedure under my direct supervision. CONTRAST:  40 cc Visipaque 320 FLUOROSCOPY TIME:  11 minutes, 48 seconds (417 mGy) COMPLICATIONS: None immediate. PROCEDURE: Informed consent was obtained from the patient following explanation of the procedure, risks, benefits and alternatives. The patient understands, agrees and consents for the procedure. All questions were addressed. A time out was performed prior to the initiation of the procedure. Maximal barrier sterile technique utilized including caps, mask, sterile gowns, sterile gloves, large sterile drape, hand hygiene, and Betadine prep. The right femoral head was marked fluoroscopically. Under ultrasound guidance, the right common femoral artery was accessed with a micropuncture kit after the overlying soft tissues were anesthetized with 1% lidocaine. An ultrasound image was saved for documentation purposes. The micropuncture sheath was exchanged for a 5 [place] vascular sheath over a Bentson  wire. A closure arteriogram was performed through the side of the sheath confirming access within the right common femoral artery. Over a Bentson wire, a Mickelson catheter was advanced to the level of the thoracic aorta where it was back bled and flushed. The catheter was then utilized to select the superior mesenteric artery and a selective superior mesenteric arteriogram was performed Next, with the use of a fathom 14 microwire, a regular Renegade microcatheter was advanced into the origin of the right colic artery and a selective right colic  arteriogram was performed. Again with the use of a fathom 14 microwire, the regular Renegade was advanced into a tertiary branch of the right colic artery a sub selective arteriogram was performed. The tertiary branch of the right colic artery supplying the location of focal extravasation into the hepatic flexure of the colon was coil embolized with 2 overlapping 2 mm x 2 cm interlock coils. The microcatheter was retracted into the more central aspect of the right colic artery and a sub selective arteriogram was performed. The microcatheter was removed and a completion superior mesenteric arteriogram was performed. Images were reviewed and the procedure was terminated. All wires, catheters and sheaths were removed from the patient. Superficial hemostasis was achieved at the right groin access site with manual compression. A dressing was placed. The patient tolerated the procedure well without immediate postprocedural complication. FINDINGS: Selective superior mesenteric arteriogram demonstrates a focal area of contrast extravasation involving a distal tertiary branch of the right colic artery, correlating with the area of suspected active extravasation seen on preceding nuclear medicine tagged red blood cell study. Selective right colic arteriogram confirms this finding. The tertiary branch of the right colic artery was successfully catheterized with sub selective injection confirming persistent extravasation. As such, the vessel was coil embolized with 2 mm diameter interlock coils. Completion selective arteriogram is negative for residual extravasation at this location confirmed with completion superior mesenteric arteriogram. More exhaustive mesenteric arteriogram was not performed this time given correlation between the angiographic findings and the preceding tagged nuclear medicine bleeding study in light of the patient's chronic renal insufficiency. IMPRESSION: Technically successful coil embolization of  extravasating tertiary branch vessel of the right colic artery correlating with the findings seen on preceding nuclear medicine tagged red blood cell bleeding scan. Electronically Signed   By: Sandi Mariscal M.D.   On: 12/18/2018 17:31   [place] Guide Roadmapping  Result Date: 12/18/2018 INDICATION: History of recent colonoscopy and polypectomy (reportedly at least 10 polyps removed), now with clinically significant lower GI bleeding following postprocedural re-initiation of Plavix. Patient with preceding positive tagged red blood cell study with expected location located within the hepatic flexure of the colon. Patient with baseline chronic renal insufficiency. EXAM: 1. ULTRASOUND GUIDANCE FOR ARTERIAL ACCESS. 2. SELECTIVE SUPERIOR MESENTERIC ARTERIOGRAM 3. SELECTIVE RIGHT COLIC ARTERIOGRAM 4. SELECTIVE ARTERIOGRAM OF TERTIARY BRANCH VESSEL THE RIGHT COLIC ARTERY AND PERCUTANEOUS COIL EMBOLIZATION. MEDICATIONS: NONE ANESTHESIA/SEDATION: Moderate (conscious) sedation was employed during this procedure. A total of Versed 3 mg and Fentanyl 100 mcg was administered intravenously. Moderate Sedation Time: 44 minutes. The patient's level of consciousness and vital signs were monitored continuously by radiology nursing throughout the procedure under my direct supervision. CONTRAST:  40 cc Visipaque 320 FLUOROSCOPY TIME:  11 minutes, 48 seconds (786 mGy) COMPLICATIONS: None immediate. PROCEDURE: Informed consent was obtained from the patient following explanation of the procedure, risks, benefits and alternatives. The patient understands, agrees and consents for the  procedure. All questions were addressed. A time out was performed prior to the initiation of the procedure. Maximal barrier sterile technique utilized including caps, mask, sterile gowns, sterile gloves, large sterile drape, hand hygiene, and Betadine prep. The right femoral head was marked fluoroscopically. Under ultrasound  guidance, the right common femoral artery was accessed with a micropuncture kit after the overlying soft tissues were anesthetized with 1% lidocaine. An ultrasound image was saved for documentation purposes. The micropuncture sheath was exchanged for a 5 [place] vascular sheath over a Bentson wire. A closure arteriogram was performed through the side of the sheath confirming access within the right common femoral artery. Over a Bentson wire, a Mickelson catheter was advanced to the level of the thoracic aorta where it was back bled and flushed. The catheter was then utilized to select the superior mesenteric artery and a selective superior mesenteric arteriogram was performed Next, with the use of a fathom 14 microwire, a regular Renegade microcatheter was advanced into the origin of the right colic artery and a selective right colic arteriogram was performed. Again with the use of a fathom 14 microwire, the regular Renegade was advanced into a tertiary branch of the right colic artery a sub selective arteriogram was performed. The tertiary branch of the right colic artery supplying the location of focal extravasation into the hepatic flexure of the colon was coil embolized with 2 overlapping 2 mm x 2 cm interlock coils. The microcatheter was retracted into the more central aspect of the right colic artery and a sub selective arteriogram was performed. The microcatheter was removed and a completion superior mesenteric arteriogram was performed. Images were reviewed and the procedure was terminated. All wires, catheters and sheaths were removed from the patient. Superficial hemostasis was achieved at the right groin access site with manual compression. A dressing was placed. The patient tolerated the procedure well without immediate postprocedural complication. FINDINGS: Selective superior mesenteric arteriogram demonstrates a focal area of contrast extravasation involving a distal tertiary branch of the right colic  artery, correlating with the area of suspected active extravasation seen on preceding nuclear medicine tagged red blood cell study. Selective right colic arteriogram confirms this finding. The tertiary branch of the right colic artery was successfully catheterized with sub selective injection confirming persistent extravasation. As such, the vessel was coil embolized with 2 mm diameter interlock coils. Completion selective arteriogram is negative for residual extravasation at this location confirmed with completion superior mesenteric arteriogram. More exhaustive mesenteric arteriogram was not performed this time given correlation between the angiographic findings and the preceding tagged nuclear medicine bleeding study in light of the patient's chronic renal insufficiency. IMPRESSION: Technically successful coil embolization of extravasating tertiary branch vessel of the right colic artery correlating with the findings seen on preceding nuclear medicine tagged red blood cell bleeding scan. Electronically Signed   By: Sandi Mariscal M.D.   On: 12/18/2018 17:31    Cardiac Studies:  Assessment/Plan:  Recurrent acute lower GI bleeding status post recent colonoscopy/polypectomystatus post colonoscopy yesterday/status post mesenteric angiogram and coil embolization of branch of right colic artery. Acute blood loss anemia stable Status post hypotension secondary to above Coronary artery disease status post CABG/congenital valvular disease status post bioprosthetic aortic valve replacement Hypertension Diabetes mellitus Hyperlipidemia Chronic kidney disease stage II/III Tobacco abuse Depression Plan Add metoprolol as per orders Advance diet as per GI PT consult Check CBC  basic metabolic panel in a.m. Out of bed to chair Dr. Doylene Canard on call for me and will take  over care of the patient.  LOS: 3 days    Charolette Forward 12/19/2018, 9:56 AM

## 2018-12-19 NOTE — Progress Notes (Signed)
Subjective: Patient was seen and examined at bedside. Had a small episode of rectal bleeding yesterday evening postembolization, no further rectal bleeding overnight or today morning. Denies abdominal pain and is requesting for diet to be advanced.  Objective: Vital signs in last 24 hours: Temp:  [97.8 F (36.6 C)-98.9 F (37.2 C)] 98.4 F (36.9 C) (12/25 0800) Pulse Rate:  [71-95] 84 (12/25 0800) Resp:  [11-28] 22 (12/25 0900) BP: (109-206)/(44-135) 161/60 (12/25 0900) SpO2:  [96 %-100 %] 99 % (12/25 0800) Weight change:  Last BM Date: 12/18/18  SH:FWYO pallor GENERAL:not in distress ABDOMEN:soft, nondistended EXTREMITIES:no deformity  Lab Results: Results for orders placed or performed during the hospital encounter of 12/16/18 (from the past 48 hour(s))  Glucose, capillary     Status: None   Collection Time: 12/17/18 11:18 AM  Result Value Ref Range   Glucose-Capillary 88 70 - 99 mg/dL  CBC     Status: Abnormal   Collection Time: 12/17/18 12:07 PM  Result Value Ref Range   WBC 9.9 4.0 - 10.5 K/uL   RBC 3.15 (L) 3.87 - 5.11 MIL/uL   Hemoglobin 9.4 (L) 12.0 - 15.0 g/dL    Comment: POST TRANSFUSION SPECIMEN   HCT 28.7 (L) 36.0 - 46.0 %   MCV 91.1 80.0 - 100.0 fL    Comment: POST TRANSFUSION SPECIMEN   MCH 29.8 26.0 - 34.0 pg   MCHC 32.8 30.0 - 36.0 g/dL   RDW 15.3 11.5 - 15.5 %   Platelets 279 150 - 400 K/uL   nRBC 0.6 (H) 0.0 - 0.2 %    Comment: Performed at [facility], [place] [address], [place], [place] [address]  Glucose, capillary     Status: None   Collection Time: 12/17/18  5:08 PM  Result Value Ref Range   Glucose-Capillary 85 70 - 99 mg/dL   Comment 1 Document in Chart   CBC     Status: Abnormal   Collection Time: 12/17/18  6:19 PM  Result Value Ref Range   WBC 9.5 4.0 - 10.5 K/uL   RBC 2.92 (L) 3.87 - 5.11 MIL/uL   Hemoglobin 8.6 (L) 12.0 - 15.0 g/dL   HCT 26.3 (L) 36.0 - 46.0 %   MCV 90.1 80.0 - 100.0 fL   MCH 29.5 26.0 - 34.0 pg    MCHC 32.7 30.0 - 36.0 g/dL   RDW 15.5 11.5 - 15.5 %   Platelets 274 150 - 400 K/uL   nRBC 0.5 (H) 0.0 - 0.2 %    Comment: Performed at [facility], [place] [address], [place], [place] [address]  Glucose, capillary     Status: None   Collection Time: 12/17/18  9:51 PM  Result Value Ref Range   Glucose-Capillary 90 70 - 99 mg/dL  CBC     Status: Abnormal   Collection Time: 12/18/18  1:38 AM  Result Value Ref Range   WBC 8.7 4.0 - 10.5 K/uL   RBC 2.85 (L) 3.87 - 5.11 MIL/uL   Hemoglobin 8.5 (L) 12.0 - 15.0 g/dL   HCT 25.9 (L) 36.0 - 46.0 %   MCV 90.9 80.0 - 100.0 fL   MCH 29.8 26.0 - 34.0 pg   MCHC 32.8 30.0 - 36.0 g/dL   RDW 15.6 (H) 11.5 - 15.5 %   Platelets 258 150 - 400 K/uL   nRBC 0.3 (H) 0.0 - 0.2 %    Comment: Performed at [facility], [place] [place],  [place] [address]  Glucose, capillary     Status: None   Collection Time: 12/18/18  7:35 AM  Result Value Ref Range   Glucose-Capillary 86 70 - 99 mg/dL  Prepare RBC     Status: None   Collection Time: 12/18/18 10:57 AM  Result Value Ref Range   Order Confirmation      BB SAMPLE OR UNITS ALREADY AVAILABLE Performed at [place] [address], [place], [place] [address]   Glucose, capillary     Status: None   Collection Time: 12/18/18  3:02 PM  Result Value Ref Range   Glucose-Capillary 83 70 - 99 mg/dL  Glucose, capillary     Status: None   Collection Time: 12/18/18  4:20 PM  Result Value Ref Range   Glucose-Capillary 83 70 - 99 mg/dL  CBC     Status: Abnormal   Collection Time: 12/18/18  4:27 PM  Result Value Ref Range   WBC 14.6 (H) 4.0 - 10.5 K/uL   RBC 3.38 (L) 3.87 - 5.11 MIL/uL   Hemoglobin 10.1 (L) 12.0 - 15.0 g/dL   HCT 30.7 (L) 36.0 - 46.0 %   MCV 90.8 80.0 - 100.0 fL   MCH 29.9 26.0 - 34.0 pg   MCHC 32.9 30.0 - 36.0 g/dL   RDW 14.8 11.5 - 15.5 %   Platelets 266 150 - 400 K/uL   nRBC 0.3 (H) 0.0 - 0.2 %    Comment: Performed at  [facility], [place] [address] [place], [place] [address]  Glucose, capillary     Status: None   Collection Time: 12/18/18  9:10 PM  Result Value Ref Range   Glucose-Capillary 81 70 - 99 mg/dL  Glucose, capillary     Status: None   Collection Time: 12/19/18  7:37 AM  Result Value Ref Range   Glucose-Capillary 71 70 - 99 mg/dL  Glucose, capillary     Status: Abnormal   Collection Time: 12/19/18  8:59 AM  Result Value Ref Range   Glucose-Capillary 65 (L) 70 - 99 mg/dL  Glucose, capillary     Status: Abnormal   Collection Time: 12/19/18  9:29 AM  Result Value Ref Range   Glucose-Capillary 133 (H) 70 - 99 mg/dL    Studies/Results: Nm Gi Blood Loss  Result Date: 12/18/2018 CLINICAL DATA:  History of recent colonoscopy and multiple polypectomy (by report, greater than 10 polyps were removed) with subsequent recurrent lower GI bleeding following the re-initiation of Plavix. EXAM: NUCLEAR MEDICINE GASTROINTESTINAL BLEEDING SCAN TECHNIQUE: Sequential abdominal images were obtained following intravenous administration of Tc-73mlabeled red blood cells. RADIOPHARMACEUTICALS:  25 mCi Tc-920mertechnetate in-vitro labeled red cells. COMPARISON:  CT abdomen pelvis-02/02/2028 FINDINGS: There is abnormal intraluminal radiotracer activity which appears to originate at the level of the hepatic flexure colon and transit through the transverse and distal sigmoid colon Physiologic activity is seen within the heart, liver, spleen and major intra-abdominal vascular structures. IMPRESSION: Examination is positive for acute lower GI bleed which appears to originate at the level of the hepatic flexure of the colon. PLAN: Patient was emergently transferred to interventional radiology for mesenteric arteriogram and potential intervention. Electronically Signed   By: JoSandi Mariscal.D.   On: 12/18/2018 12:35   Ir Angiogram Visceral Selective  Result Date: 12/18/2018 INDICATION: History of recent  colonoscopy and polypectomy (reportedly at least 10 polyps removed), now with clinically significant lower GI bleeding following postprocedural re-initiation of Plavix. Patient with preceding positive tagged red  blood cell study with expected location located within the hepatic flexure of the colon. Patient with baseline chronic renal insufficiency. EXAM: 1. ULTRASOUND GUIDANCE FOR ARTERIAL ACCESS. 2. SELECTIVE SUPERIOR MESENTERIC ARTERIOGRAM 3. SELECTIVE RIGHT COLIC ARTERIOGRAM 4. SELECTIVE ARTERIOGRAM OF TERTIARY BRANCH VESSEL THE RIGHT COLIC ARTERY AND PERCUTANEOUS COIL EMBOLIZATION. MEDICATIONS: NONE ANESTHESIA/SEDATION: Moderate (conscious) sedation was employed during this procedure. A total of Versed 3 mg and Fentanyl 100 mcg was administered intravenously. Moderate Sedation Time: 44 minutes. The patient's level of consciousness and vital signs were monitored continuously by radiology nursing throughout the procedure under my direct supervision. CONTRAST:  40 cc Visipaque 320 FLUOROSCOPY TIME:  11 minutes, 48 seconds (163 mGy) COMPLICATIONS: None immediate. PROCEDURE: Informed consent was obtained from the patient following explanation of the procedure, risks, benefits and alternatives. The patient understands, agrees and consents for the procedure. All questions were addressed. A time out was performed prior to the initiation of the procedure. Maximal barrier sterile technique utilized including caps, mask, sterile gowns, sterile gloves, large sterile drape, hand hygiene, and Betadine prep. The right femoral head was marked fluoroscopically. Under ultrasound guidance, the right common femoral artery was accessed with a micropuncture kit after the overlying soft tissues were anesthetized with 1% lidocaine. An ultrasound image was saved for documentation purposes. The micropuncture sheath was exchanged for a 5 [place] vascular sheath over a Bentson wire. A closure arteriogram was performed through the side of  the sheath confirming access within the right common femoral artery. Over a Bentson wire, a Mickelson catheter was advanced to the level of the thoracic aorta where it was back bled and flushed. The catheter was then utilized to select the superior mesenteric artery and a selective superior mesenteric arteriogram was performed Next, with the use of a fathom 14 microwire, a regular Renegade microcatheter was advanced into the origin of the right colic artery and a selective right colic arteriogram was performed. Again with the use of a fathom 14 microwire, the regular Renegade was advanced into a tertiary branch of the right colic artery a sub selective arteriogram was performed. The tertiary branch of the right colic artery supplying the location of focal extravasation into the hepatic flexure of the colon was coil embolized with 2 overlapping 2 mm x 2 cm interlock coils. The microcatheter was retracted into the more central aspect of the right colic artery and a sub selective arteriogram was performed. The microcatheter was removed and a completion superior mesenteric arteriogram was performed. Images were reviewed and the procedure was terminated. All wires, catheters and sheaths were removed from the patient. Superficial hemostasis was achieved at the right groin access site with manual compression. A dressing was placed. The patient tolerated the procedure well without immediate postprocedural complication. FINDINGS: Selective superior mesenteric arteriogram demonstrates a focal area of contrast extravasation involving a distal tertiary branch of the right colic artery, correlating with the area of suspected active extravasation seen on preceding nuclear medicine tagged red blood cell study. Selective right colic arteriogram confirms this finding. The tertiary branch of the right colic artery was successfully catheterized with sub selective injection confirming persistent extravasation. As such, the vessel was  coil embolized with 2 mm diameter interlock coils. Completion selective arteriogram is negative for residual extravasation at this location confirmed with completion superior mesenteric arteriogram. More exhaustive mesenteric arteriogram was not performed this time given correlation between the angiographic findings and the preceding tagged nuclear medicine bleeding study in light of the patient's chronic renal insufficiency. IMPRESSION: Technically successful  coil embolization of extravasating tertiary branch vessel of the right colic artery correlating with the findings seen on preceding nuclear medicine tagged red blood cell bleeding scan. Electronically Signed   By: Sandi Mariscal M.D.   On: 12/18/2018 17:31   Ir Angiogram Selective Each Additional Vessel  Result Date: 12/18/2018 INDICATION: History of recent colonoscopy and polypectomy (reportedly at least 10 polyps removed), now with clinically significant lower GI bleeding following postprocedural re-initiation of Plavix. Patient with preceding positive tagged red blood cell study with expected location located within the hepatic flexure of the colon. Patient with baseline chronic renal insufficiency. EXAM: 1. ULTRASOUND GUIDANCE FOR ARTERIAL ACCESS. 2. SELECTIVE SUPERIOR MESENTERIC ARTERIOGRAM 3. SELECTIVE RIGHT COLIC ARTERIOGRAM 4. SELECTIVE ARTERIOGRAM OF TERTIARY BRANCH VESSEL THE RIGHT COLIC ARTERY AND PERCUTANEOUS COIL EMBOLIZATION. MEDICATIONS: NONE ANESTHESIA/SEDATION: Moderate (conscious) sedation was employed during this procedure. A total of Versed 3 mg and Fentanyl 100 mcg was administered intravenously. Moderate Sedation Time: 44 minutes. The patient's level of consciousness and vital signs were monitored continuously by radiology nursing throughout the procedure under my direct supervision. CONTRAST:  40 cc Visipaque 320 FLUOROSCOPY TIME:  11 minutes, 48 seconds (381 mGy) COMPLICATIONS: None immediate. PROCEDURE: Informed consent was obtained  from the patient following explanation of the procedure, risks, benefits and alternatives. The patient understands, agrees and consents for the procedure. All questions were addressed. A time out was performed prior to the initiation of the procedure. Maximal barrier sterile technique utilized including caps, mask, sterile gowns, sterile gloves, large sterile drape, hand hygiene, and Betadine prep. The right femoral head was marked fluoroscopically. Under ultrasound guidance, the right common femoral artery was accessed with a micropuncture kit after the overlying soft tissues were anesthetized with 1% lidocaine. An ultrasound image was saved for documentation purposes. The micropuncture sheath was exchanged for a 5 [place] vascular sheath over a Bentson wire. A closure arteriogram was performed through the side of the sheath confirming access within the right common femoral artery. Over a Bentson wire, a Mickelson catheter was advanced to the level of the thoracic aorta where it was back bled and flushed. The catheter was then utilized to select the superior mesenteric artery and a selective superior mesenteric arteriogram was performed Next, with the use of a fathom 14 microwire, a regular Renegade microcatheter was advanced into the origin of the right colic artery and a selective right colic arteriogram was performed. Again with the use of a fathom 14 microwire, the regular Renegade was advanced into a tertiary branch of the right colic artery a sub selective arteriogram was performed. The tertiary branch of the right colic artery supplying the location of focal extravasation into the hepatic flexure of the colon was coil embolized with 2 overlapping 2 mm x 2 cm interlock coils. The microcatheter was retracted into the more central aspect of the right colic artery and a sub selective arteriogram was performed. The microcatheter was removed and a completion superior mesenteric arteriogram was performed. Images  were reviewed and the procedure was terminated. All wires, catheters and sheaths were removed from the patient. Superficial hemostasis was achieved at the right groin access site with manual compression. A dressing was placed. The patient tolerated the procedure well without immediate postprocedural complication. FINDINGS: Selective superior mesenteric arteriogram demonstrates a focal area of contrast extravasation involving a distal tertiary branch of the right colic artery, correlating with the area of suspected active extravasation seen on preceding nuclear medicine tagged red blood cell study. Selective right colic arteriogram confirms  this finding. The tertiary branch of the right colic artery was successfully catheterized with sub selective injection confirming persistent extravasation. As such, the vessel was coil embolized with 2 mm diameter interlock coils. Completion selective arteriogram is negative for residual extravasation at this location confirmed with completion superior mesenteric arteriogram. More exhaustive mesenteric arteriogram was not performed this time given correlation between the angiographic findings and the preceding tagged nuclear medicine bleeding study in light of the patient's chronic renal insufficiency. IMPRESSION: Technically successful coil embolization of extravasating tertiary branch vessel of the right colic artery correlating with the findings seen on preceding nuclear medicine tagged red blood cell bleeding scan. Electronically Signed   By: Sandi Mariscal M.D.   On: 12/18/2018 17:31   Ir Angiogram Selective Each Additional Vessel  Result Date: 12/18/2018 INDICATION: History of recent colonoscopy and polypectomy (reportedly at least 10 polyps removed), now with clinically significant lower GI bleeding following postprocedural re-initiation of Plavix. Patient with preceding positive tagged red blood cell study with expected location located within the hepatic flexure of the  colon. Patient with baseline chronic renal insufficiency. EXAM: 1. ULTRASOUND GUIDANCE FOR ARTERIAL ACCESS. 2. SELECTIVE SUPERIOR MESENTERIC ARTERIOGRAM 3. SELECTIVE RIGHT COLIC ARTERIOGRAM 4. SELECTIVE ARTERIOGRAM OF TERTIARY BRANCH VESSEL THE RIGHT COLIC ARTERY AND PERCUTANEOUS COIL EMBOLIZATION. MEDICATIONS: NONE ANESTHESIA/SEDATION: Moderate (conscious) sedation was employed during this procedure. A total of Versed 3 mg and Fentanyl 100 mcg was administered intravenously. Moderate Sedation Time: 44 minutes. The patient's level of consciousness and vital signs were monitored continuously by radiology nursing throughout the procedure under my direct supervision. CONTRAST:  40 cc Visipaque 320 FLUOROSCOPY TIME:  11 minutes, 48 seconds (009 mGy) COMPLICATIONS: None immediate. PROCEDURE: Informed consent was obtained from the patient following explanation of the procedure, risks, benefits and alternatives. The patient understands, agrees and consents for the procedure. All questions were addressed. A time out was performed prior to the initiation of the procedure. Maximal barrier sterile technique utilized including caps, mask, sterile gowns, sterile gloves, large sterile drape, hand hygiene, and Betadine prep. The right femoral head was marked fluoroscopically. Under ultrasound guidance, the right common femoral artery was accessed with a micropuncture kit after the overlying soft tissues were anesthetized with 1% lidocaine. An ultrasound image was saved for documentation purposes. The micropuncture sheath was exchanged for a 5 [place] vascular sheath over a Bentson wire. A closure arteriogram was performed through the side of the sheath confirming access within the right common femoral artery. Over a Bentson wire, a Mickelson catheter was advanced to the level of the thoracic aorta where it was back bled and flushed. The catheter was then utilized to select the superior mesenteric artery and a selective superior  mesenteric arteriogram was performed Next, with the use of a fathom 14 microwire, a regular Renegade microcatheter was advanced into the origin of the right colic artery and a selective right colic arteriogram was performed. Again with the use of a fathom 14 microwire, the regular Renegade was advanced into a tertiary branch of the right colic artery a sub selective arteriogram was performed. The tertiary branch of the right colic artery supplying the location of focal extravasation into the hepatic flexure of the colon was coil embolized with 2 overlapping 2 mm x 2 cm interlock coils. The microcatheter was retracted into the more central aspect of the right colic artery and a sub selective arteriogram was performed. The microcatheter was removed and a completion superior mesenteric arteriogram was performed. Images were reviewed and the procedure  was terminated. All wires, catheters and sheaths were removed from the patient. Superficial hemostasis was achieved at the right groin access site with manual compression. A dressing was placed. The patient tolerated the procedure well without immediate postprocedural complication. FINDINGS: Selective superior mesenteric arteriogram demonstrates a focal area of contrast extravasation involving a distal tertiary branch of the right colic artery, correlating with the area of suspected active extravasation seen on preceding nuclear medicine tagged red blood cell study. Selective right colic arteriogram confirms this finding. The tertiary branch of the right colic artery was successfully catheterized with sub selective injection confirming persistent extravasation. As such, the vessel was coil embolized with 2 mm diameter interlock coils. Completion selective arteriogram is negative for residual extravasation at this location confirmed with completion superior mesenteric arteriogram. More exhaustive mesenteric arteriogram was not performed this time given correlation between  the angiographic findings and the preceding tagged nuclear medicine bleeding study in light of the patient's chronic renal insufficiency. IMPRESSION: Technically successful coil embolization of extravasating tertiary branch vessel of the right colic artery correlating with the findings seen on preceding nuclear medicine tagged red blood cell bleeding scan. Electronically Signed   By: Sandi Mariscal M.D.   On: 12/18/2018 17:31   Ir Angiogram Selective Each Additional Vessel  Result Date: 12/18/2018 INDICATION: History of recent colonoscopy and polypectomy (reportedly at least 10 polyps removed), now with clinically significant lower GI bleeding following postprocedural re-initiation of Plavix. Patient with preceding positive tagged red blood cell study with expected location located within the hepatic flexure of the colon. Patient with baseline chronic renal insufficiency. EXAM: 1. ULTRASOUND GUIDANCE FOR ARTERIAL ACCESS. 2. SELECTIVE SUPERIOR MESENTERIC ARTERIOGRAM 3. SELECTIVE RIGHT COLIC ARTERIOGRAM 4. SELECTIVE ARTERIOGRAM OF TERTIARY BRANCH VESSEL THE RIGHT COLIC ARTERY AND PERCUTANEOUS COIL EMBOLIZATION. MEDICATIONS: NONE ANESTHESIA/SEDATION: Moderate (conscious) sedation was employed during this procedure. A total of Versed 3 mg and Fentanyl 100 mcg was administered intravenously. Moderate Sedation Time: 44 minutes. The patient's level of consciousness and vital signs were monitored continuously by radiology nursing throughout the procedure under my direct supervision. CONTRAST:  40 cc Visipaque 320 FLUOROSCOPY TIME:  11 minutes, 48 seconds (101 mGy) COMPLICATIONS: None immediate. PROCEDURE: Informed consent was obtained from the patient following explanation of the procedure, risks, benefits and alternatives. The patient understands, agrees and consents for the procedure. All questions were addressed. A time out was performed prior to the initiation of the procedure. Maximal barrier sterile technique  utilized including caps, mask, sterile gowns, sterile gloves, large sterile drape, hand hygiene, and Betadine prep. The right femoral head was marked fluoroscopically. Under ultrasound guidance, the right common femoral artery was accessed with a micropuncture kit after the overlying soft tissues were anesthetized with 1% lidocaine. An ultrasound image was saved for documentation purposes. The micropuncture sheath was exchanged for a 5 [place] vascular sheath over a Bentson wire. A closure arteriogram was performed through the side of the sheath confirming access within the right common femoral artery. Over a Bentson wire, a Mickelson catheter was advanced to the level of the thoracic aorta where it was back bled and flushed. The catheter was then utilized to select the superior mesenteric artery and a selective superior mesenteric arteriogram was performed Next, with the use of a fathom 14 microwire, a regular Renegade microcatheter was advanced into the origin of the right colic artery and a selective right colic arteriogram was performed. Again with the use of a fathom 14 microwire, the regular Renegade was advanced into a tertiary branch  of the right colic artery a sub selective arteriogram was performed. The tertiary branch of the right colic artery supplying the location of focal extravasation into the hepatic flexure of the colon was coil embolized with 2 overlapping 2 mm x 2 cm interlock coils. The microcatheter was retracted into the more central aspect of the right colic artery and a sub selective arteriogram was performed. The microcatheter was removed and a completion superior mesenteric arteriogram was performed. Images were reviewed and the procedure was terminated. All wires, catheters and sheaths were removed from the patient. Superficial hemostasis was achieved at the right groin access site with manual compression. A dressing was placed. The patient tolerated the procedure well without immediate  postprocedural complication. FINDINGS: Selective superior mesenteric arteriogram demonstrates a focal area of contrast extravasation involving a distal tertiary branch of the right colic artery, correlating with the area of suspected active extravasation seen on preceding nuclear medicine tagged red blood cell study. Selective right colic arteriogram confirms this finding. The tertiary branch of the right colic artery was successfully catheterized with sub selective injection confirming persistent extravasation. As such, the vessel was coil embolized with 2 mm diameter interlock coils. Completion selective arteriogram is negative for residual extravasation at this location confirmed with completion superior mesenteric arteriogram. More exhaustive mesenteric arteriogram was not performed this time given correlation between the angiographic findings and the preceding tagged nuclear medicine bleeding study in light of the patient's chronic renal insufficiency. IMPRESSION: Technically successful coil embolization of extravasating tertiary branch vessel of the right colic artery correlating with the findings seen on preceding nuclear medicine tagged red blood cell bleeding scan. Electronically Signed   By: Sandi Mariscal M.D.   On: 12/18/2018 17:31   Ir Angiogram Selective Each Additional Vessel  Result Date: 12/18/2018 INDICATION: History of recent colonoscopy and polypectomy (reportedly at least 10 polyps removed), now with clinically significant lower GI bleeding following postprocedural re-initiation of Plavix. Patient with preceding positive tagged red blood cell study with expected location located within the hepatic flexure of the colon. Patient with baseline chronic renal insufficiency. EXAM: 1. ULTRASOUND GUIDANCE FOR ARTERIAL ACCESS. 2. SELECTIVE SUPERIOR MESENTERIC ARTERIOGRAM 3. SELECTIVE RIGHT COLIC ARTERIOGRAM 4. SELECTIVE ARTERIOGRAM OF TERTIARY BRANCH VESSEL THE RIGHT COLIC ARTERY AND PERCUTANEOUS COIL  EMBOLIZATION. MEDICATIONS: NONE ANESTHESIA/SEDATION: Moderate (conscious) sedation was employed during this procedure. A total of Versed 3 mg and Fentanyl 100 mcg was administered intravenously. Moderate Sedation Time: 44 minutes. The patient's level of consciousness and vital signs were monitored continuously by radiology nursing throughout the procedure under my direct supervision. CONTRAST:  40 cc Visipaque 320 FLUOROSCOPY TIME:  11 minutes, 48 seconds (209 mGy) COMPLICATIONS: None immediate. PROCEDURE: Informed consent was obtained from the patient following explanation of the procedure, risks, benefits and alternatives. The patient understands, agrees and consents for the procedure. All questions were addressed. A time out was performed prior to the initiation of the procedure. Maximal barrier sterile technique utilized including caps, mask, sterile gowns, sterile gloves, large sterile drape, hand hygiene, and Betadine prep. The right femoral head was marked fluoroscopically. Under ultrasound guidance, the right common femoral artery was accessed with a micropuncture kit after the overlying soft tissues were anesthetized with 1% lidocaine. An ultrasound image was saved for documentation purposes. The micropuncture sheath was exchanged for a 5 [place] vascular sheath over a Bentson wire. A closure arteriogram was performed through the side of the sheath confirming access within the right common femoral artery. Over a Bentson wire, a Mickelson catheter  was advanced to the level of the thoracic aorta where it was back bled and flushed. The catheter was then utilized to select the superior mesenteric artery and a selective superior mesenteric arteriogram was performed Next, with the use of a fathom 14 microwire, a regular Renegade microcatheter was advanced into the origin of the right colic artery and a selective right colic arteriogram was performed. Again with the use of a fathom 14 microwire, the regular  Renegade was advanced into a tertiary branch of the right colic artery a sub selective arteriogram was performed. The tertiary branch of the right colic artery supplying the location of focal extravasation into the hepatic flexure of the colon was coil embolized with 2 overlapping 2 mm x 2 cm interlock coils. The microcatheter was retracted into the more central aspect of the right colic artery and a sub selective arteriogram was performed. The microcatheter was removed and a completion superior mesenteric arteriogram was performed. Images were reviewed and the procedure was terminated. All wires, catheters and sheaths were removed from the patient. Superficial hemostasis was achieved at the right groin access site with manual compression. A dressing was placed. The patient tolerated the procedure well without immediate postprocedural complication. FINDINGS: Selective superior mesenteric arteriogram demonstrates a focal area of contrast extravasation involving a distal tertiary branch of the right colic artery, correlating with the area of suspected active extravasation seen on preceding nuclear medicine tagged red blood cell study. Selective right colic arteriogram confirms this finding. The tertiary branch of the right colic artery was successfully catheterized with sub selective injection confirming persistent extravasation. As such, the vessel was coil embolized with 2 mm diameter interlock coils. Completion selective arteriogram is negative for residual extravasation at this location confirmed with completion superior mesenteric arteriogram. More exhaustive mesenteric arteriogram was not performed this time given correlation between the angiographic findings and the preceding tagged nuclear medicine bleeding study in light of the patient's chronic renal insufficiency. IMPRESSION: Technically successful coil embolization of extravasating tertiary branch vessel of the right colic artery correlating with the  findings seen on preceding nuclear medicine tagged red blood cell bleeding scan. Electronically Signed   By: Sandi Mariscal M.D.   On: 12/18/2018 17:31   Ir US Guide Vasc Access Right  Result Date: 12/18/2018 INDICATION: History of recent colonoscopy and polypectomy (reportedly at least 10 polyps removed), now with clinically significant lower GI bleeding following postprocedural re-initiation of Plavix. Patient with preceding positive tagged red blood cell study with expected location located within the hepatic flexure of the colon. Patient with baseline chronic renal insufficiency. EXAM: 1. ULTRASOUND GUIDANCE FOR ARTERIAL ACCESS. 2. SELECTIVE SUPERIOR MESENTERIC ARTERIOGRAM 3. SELECTIVE RIGHT COLIC ARTERIOGRAM 4. SELECTIVE ARTERIOGRAM OF TERTIARY BRANCH VESSEL THE RIGHT COLIC ARTERY AND PERCUTANEOUS COIL EMBOLIZATION. MEDICATIONS: NONE ANESTHESIA/SEDATION: Moderate (conscious) sedation was employed during this procedure. A total of Versed 3 mg and Fentanyl 100 mcg was administered intravenously. Moderate Sedation Time: 44 minutes. The patient's level of consciousness and vital signs were monitored continuously by radiology nursing throughout the procedure under my direct supervision. CONTRAST:  40 cc Visipaque 320 FLUOROSCOPY TIME:  11 minutes, 48 seconds (637 mGy) COMPLICATIONS: None immediate. PROCEDURE: Informed consent was obtained from the patient following explanation of the procedure, risks, benefits and alternatives. The patient understands, agrees and consents for the procedure. All questions were addressed. A time out was performed prior to the initiation of the procedure. Maximal barrier sterile technique utilized including caps, mask, sterile gowns, sterile gloves, large sterile drape, hand  hygiene, and Betadine prep. The right femoral head was marked fluoroscopically. Under ultrasound guidance, the right common femoral artery was accessed with a micropuncture kit after the overlying soft tissues  were anesthetized with 1% lidocaine. An ultrasound image was saved for documentation purposes. The micropuncture sheath was exchanged for a 5 [place] vascular sheath over a Bentson wire. A closure arteriogram was performed through the side of the sheath confirming access within the right common femoral artery. Over a Bentson wire, a Mickelson catheter was advanced to the level of the thoracic aorta where it was back bled and flushed. The catheter was then utilized to select the superior mesenteric artery and a selective superior mesenteric arteriogram was performed Next, with the use of a fathom 14 microwire, a regular Renegade microcatheter was advanced into the origin of the right colic artery and a selective right colic arteriogram was performed. Again with the use of a fathom 14 microwire, the regular Renegade was advanced into a tertiary branch of the right colic artery a sub selective arteriogram was performed. The tertiary branch of the right colic artery supplying the location of focal extravasation into the hepatic flexure of the colon was coil embolized with 2 overlapping 2 mm x 2 cm interlock coils. The microcatheter was retracted into the more central aspect of the right colic artery and a sub selective arteriogram was performed. The microcatheter was removed and a completion superior mesenteric arteriogram was performed. Images were reviewed and the procedure was terminated. All wires, catheters and sheaths were removed from the patient. Superficial hemostasis was achieved at the right groin access site with manual compression. A dressing was placed. The patient tolerated the procedure well without immediate postprocedural complication. FINDINGS: Selective superior mesenteric arteriogram demonstrates a focal area of contrast extravasation involving a distal tertiary branch of the right colic artery, correlating with the area of suspected active extravasation seen on preceding nuclear medicine tagged red  blood cell study. Selective right colic arteriogram confirms this finding. The tertiary branch of the right colic artery was successfully catheterized with sub selective injection confirming persistent extravasation. As such, the vessel was coil embolized with 2 mm diameter interlock coils. Completion selective arteriogram is negative for residual extravasation at this location confirmed with completion superior mesenteric arteriogram. More exhaustive mesenteric arteriogram was not performed this time given correlation between the angiographic findings and the preceding tagged nuclear medicine bleeding study in light of the patient's chronic renal insufficiency. IMPRESSION: Technically successful coil embolization of extravasating tertiary branch vessel of the right colic artery correlating with the findings seen on preceding nuclear medicine tagged red blood cell bleeding scan. Electronically Signed   By: Sandi Mariscal M.D.   On: 12/18/2018 17:31   [place] Guide Roadmapping  Result Date: 12/18/2018 INDICATION: History of recent colonoscopy and polypectomy (reportedly at least 10 polyps removed), now with clinically significant lower GI bleeding following postprocedural re-initiation of Plavix. Patient with preceding positive tagged red blood cell study with expected location located within the hepatic flexure of the colon. Patient with baseline chronic renal insufficiency. EXAM: 1. ULTRASOUND GUIDANCE FOR ARTERIAL ACCESS. 2. SELECTIVE SUPERIOR MESENTERIC ARTERIOGRAM 3. SELECTIVE RIGHT COLIC ARTERIOGRAM 4. SELECTIVE ARTERIOGRAM OF TERTIARY BRANCH VESSEL THE RIGHT COLIC ARTERY AND PERCUTANEOUS COIL EMBOLIZATION. MEDICATIONS: NONE ANESTHESIA/SEDATION: Moderate (conscious) sedation was employed during this procedure. A total of Versed 3 mg and Fentanyl 100 mcg was administered intravenously. Moderate Sedation Time: 44 minutes. The patient's level of consciousness and vital  signs  were monitored continuously by radiology nursing throughout the procedure under my direct supervision. CONTRAST:  40 cc Visipaque 320 FLUOROSCOPY TIME:  11 minutes, 48 seconds (024 mGy) COMPLICATIONS: None immediate. PROCEDURE: Informed consent was obtained from the patient following explanation of the procedure, risks, benefits and alternatives. The patient understands, agrees and consents for the procedure. All questions were addressed. A time out was performed prior to the initiation of the procedure. Maximal barrier sterile technique utilized including caps, mask, sterile gowns, sterile gloves, large sterile drape, hand hygiene, and Betadine prep. The right femoral head was marked fluoroscopically. Under ultrasound guidance, the right common femoral artery was accessed with a micropuncture kit after the overlying soft tissues were anesthetized with 1% lidocaine. An ultrasound image was saved for documentation purposes. The micropuncture sheath was exchanged for a 5 [place] vascular sheath over a Bentson wire. A closure arteriogram was performed through the side of the sheath confirming access within the right common femoral artery. Over a Bentson wire, a Mickelson catheter was advanced to the level of the thoracic aorta where it was back bled and flushed. The catheter was then utilized to select the superior mesenteric artery and a selective superior mesenteric arteriogram was performed Next, with the use of a fathom 14 microwire, a regular Renegade microcatheter was advanced into the origin of the right colic artery and a selective right colic arteriogram was performed. Again with the use of a fathom 14 microwire, the regular Renegade was advanced into a tertiary branch of the right colic artery a sub selective arteriogram was performed. The tertiary branch of the right colic artery supplying the location of focal extravasation into the hepatic flexure of the colon was coil embolized with 2 overlapping 2 mm x 2  cm interlock coils. The microcatheter was retracted into the more central aspect of the right colic artery and a sub selective arteriogram was performed. The microcatheter was removed and a completion superior mesenteric arteriogram was performed. Images were reviewed and the procedure was terminated. All wires, catheters and sheaths were removed from the patient. Superficial hemostasis was achieved at the right groin access site with manual compression. A dressing was placed. The patient tolerated the procedure well without immediate postprocedural complication. FINDINGS: Selective superior mesenteric arteriogram demonstrates a focal area of contrast extravasation involving a distal tertiary branch of the right colic artery, correlating with the area of suspected active extravasation seen on preceding nuclear medicine tagged red blood cell study. Selective right colic arteriogram confirms this finding. The tertiary branch of the right colic artery was successfully catheterized with sub selective injection confirming persistent extravasation. As such, the vessel was coil embolized with 2 mm diameter interlock coils. Completion selective arteriogram is negative for residual extravasation at this location confirmed with completion superior mesenteric arteriogram. More exhaustive mesenteric arteriogram was not performed this time given correlation between the angiographic findings and the preceding tagged nuclear medicine bleeding study in light of the patient's chronic renal insufficiency. IMPRESSION: Technically successful coil embolization of extravasating tertiary branch vessel of the right colic artery correlating with the findings seen on preceding nuclear medicine tagged red blood cell bleeding scan. Electronically Signed   By: Sandi Mariscal M.D.   On: 12/18/2018 17:31    Medications: I have reviewed the patient's current medications.  Assessment: Post polypectomy bleed,active bleeding noted at level of  hepatic flexure on bleeding scan Successful coil embolization of extravasating tertiary branch vessel of right colic artery correlating with findings on nuclear tagged bleeding scan  performed by Dr. Sandi Mariscal  Plan: Clear liquid diet today Advanced to regular diet in a.m. if no further rectal bleeding noted Patient will likely need to hold Plavix at least for another week due to recent severe post-polypectomy bleeding, if agreeable with cardiology    Ronnette Juniper 12/19/2018, 10:35 AM   Pager [phone] If no answer or after 5 PM call [phone]

## 2018-12-20 LAB — GLUCOSE, CAPILLARY
Glucose-Capillary: 74 mg/dL (ref 70–99)
Glucose-Capillary: 115 mg/dL — ABNORMAL HIGH (ref 70–99)
Glucose-Capillary: 68 mg/dL — ABNORMAL LOW (ref 70–99)
Glucose-Capillary: 125 mg/dL — ABNORMAL HIGH (ref 70–99)
Glucose-Capillary: 92 mg/dL (ref 70–99)

## 2018-12-20 LAB — BASIC METABOLIC PANEL
Anion gap: 10 (ref 5–15)
BUN: 14 mg/dL (ref 8–23)
CHLORIDE: 117 mmol/L — AB (ref 98–111)
CO2: 14 mmol/L — ABNORMAL LOW (ref 22–32)
Calcium: 8.3 mg/dL — ABNORMAL LOW (ref 8.9–10.3)
Creatinine, Ser: 1.2 mg/dL — ABNORMAL HIGH (ref 0.44–1.00)
GFR calc Af Amer: 56 mL/min — ABNORMAL LOW (ref >60)
GFR calc non Af Amer: 48 mL/min — ABNORMAL LOW (ref >60)
Glucose, Bld: 90 mg/dL (ref 70–99)
Potassium: 3.3 mmol/L — ABNORMAL LOW (ref 3.5–5.1)
Sodium: 141 mmol/L (ref 135–145)

## 2018-12-20 LAB — CBC
HEMATOCRIT: 25.8 % — AB (ref 36.0–46.0)
Hemoglobin: 8.2 g/dL — ABNORMAL LOW (ref 12.0–15.0)
MCH: 29.7 pg (ref 26.0–34.0)
MCHC: 31.8 g/dL (ref 30.0–36.0)
MCV: 93.5 fL (ref 80.0–100.0)
Platelets: 272 10*3/uL (ref 150–400)
RBC: 2.76 MIL/uL — AB (ref 3.87–5.11)
RDW: 15.6 % — ABNORMAL HIGH (ref 11.5–15.5)
WBC: 10.4 10*3/uL (ref 4.0–10.5)
nRBC: 0 % (ref 0.0–0.2)

## 2018-12-20 NOTE — Progress Notes (Signed)
Hypoglycemic Event  CBG: 68  Treatment: 6 oz of apple juice  Symptoms: none  Follow-up CBG: Time:1733 CBG Result:125  Possible Reasons for Event: not taking in a lot of liquids    Renee Griffin

## 2018-12-20 NOTE — Progress Notes (Signed)
Subjective: Some clots per rectum last night and this morning.  Not so much fresh blood.  Objective: Vital signs in last 24 hours: Temp:  [97.6 F (36.4 C)-98.6 F (37 C)] 97.8 F (36.6 C) (12/26 0800) Pulse Rate:  [59-84] 81 (12/26 0927) Resp:  [12-24] 13 (12/26 0927) BP: (120-187)/(29-108) 186/79 (12/26 0927) SpO2:  [95 %-100 %] 95 % (12/26 0927) Weight change:  Last BM Date: 12/20/18  PE: GEN:  NAD ABD:  Soft  Lab Results: CBC    Component Value Date/Time   WBC 10.4 12/20/2018 0518   RBC 2.76 (L) 12/20/2018 0518   HGB 8.2 (L) 12/20/2018 0518   HCT 25.8 (L) 12/20/2018 0518   PLT 272 12/20/2018 0518   MCV 93.5 12/20/2018 0518   MCH 29.7 12/20/2018 0518   MCHC 31.8 12/20/2018 0518   RDW 15.6 (H) 12/20/2018 0518   LYMPHSABS 1.3 12/16/2018 1416   MONOABS 0.6 12/16/2018 1416   EOSABS 0.2 12/16/2018 1416   BASOSABS 0.1 12/16/2018 1416   CMP     Component Value Date/Time   NA 141 12/20/2018 0315   K 3.3 (L) 12/20/2018 0315   CL 117 (H) 12/20/2018 0315   CO2 14 (L) 12/20/2018 0315   GLUCOSE 90 12/20/2018 0315   BUN 14 12/20/2018 0315   CREATININE 1.20 (H) 12/20/2018 0315   CALCIUM 8.3 (L) 12/20/2018 0315   PROT 5.7 (L) 12/16/2018 1416   ALBUMIN 2.9 (L) 12/16/2018 1416   AST 13 (L) 12/16/2018 1416   ALT 9 12/16/2018 1416   ALKPHOS 150 (H) 12/16/2018 1416   BILITOT 0.5 12/16/2018 1416   GFRNONAA 48 (L) 12/20/2018 0315   GFRAA 56 (L) 12/20/2018 0315   Assessment:  1.  Post polypectomy bleeding.  Has had embolization two days ago.   2.  Acute blood loss anemia.  Hgb not dropping overnight.  Hopefully clots are old blood passage. 3.  Chronic anticoagulation.  On hold.  Plan:  1.  Clear liquid diet only. 2.  If bleeding escalates and appears more fresh, would need repeat IR evaluation for management assistance (repeat angiogram versus start with tagged RBC study). 3.  Anticoagulation on hold. 4.  Follow CBCs. 5.  [facility] will follow.   Renee Griffin 12/20/2018, 11:38 AM   Cell [phone] If no answer or after 5 PM call [phone]

## 2018-12-20 NOTE — Progress Notes (Signed)
RN was asked by patient to get her purse for her. After pt got purse, RN heard pt digging in her purse and started messing with what sounded like a pill bottle. She gave the purse back to the RN and RN proceeded to ask if she was taking pills from her purse. She said yes and that it was just some vitamins. RN could not see exactly what the prescription bottle was and the pt got really defensive and  refused to let her see what the pill bottle was in her purse. RN informed the pt that she isn't allowed to take her medications from home and that any medication has to go through the pharmacy here at the hospital and administered by a nurse. She said "whatever I won't do it again". The purse is sitting on counter beside sink. Charge nurse notified.

## 2018-12-20 NOTE — Progress Notes (Signed)
Pt had orthostatic vital signs due to be done at midnight but pt refused to do them because she wanted to sleep.

## 2018-12-20 NOTE — Progress Notes (Signed)
Referring Physician(s): Ronnette Juniper  Supervising Physician: Sandi Mariscal  Patient Status:  [facility] - In-pt  Chief Complaint: None  Subjective:  Lower GI bleed s/p coil embolization of extravasating tertiary branch vessel of the right colic artery [id number]/59/5638 by Dr. Pascal Lux. Patient awake and alert laying in bed with no complaints at this time. States that she passed some clots via rectum last evening and this AM, but none since. Right groin incision c/d/i.   Allergies: Amoxicillin-pot clavulanate; Bupropion; and Nicotine  Medications: Prior to Admission medications   Medication Sig Start Date End Date Taking? Authorizing Provider  ALPRAZolam Duanne Moron) 0.5 MG tablet Take 0.25 mg by mouth 3 (three) times daily.  01/28/18  Yes [provider]  cholecalciferol (VITAMIN D3) 25 MCG (1000 UT) tablet Take 1,000 Units by mouth daily.   Yes [provider]  diltiazem (CARDIZEM) 90 MG tablet Take 1 tablet (90 mg total) by mouth 2 (two) times daily. 02/09/18  Yes Dixie Dials, MD  furosemide (LASIX) 20 MG tablet Take 1 tablet (20 mg total) by mouth every Monday, Wednesday, and Friday. Patient taking differently: Take 20 mg by mouth daily.  04/16/18  Yes Dixie Dials, MD  gemfibrozil (LOPID) 600 MG tablet Take 600 mg by mouth 2 (two) times daily. 02/19/18  Yes [provider]  metFORMIN (GLUCOPHAGE) 500 MG tablet Take 1 tablet (500 mg total) by mouth 2 (two) times daily. 04/15/18  Yes Dixie Dials, MD  prazosin (MINIPRESS) 1 MG capsule Take 1 capsule (1 mg total) by mouth 2 (two) times daily. 02/09/18  Yes Dixie Dials, MD  ACCU-CHEK AVIVA PLUS test strip See admin instructions. 01/29/16   [provider]  ACCU-CHEK SOFTCLIX LANCETS lancets See admin instructions. 01/30/16   [provider]  acetaminophen-codeine (TYLENOL #3) 300-30 MG per tablet Take 1-2 tablets by mouth daily as needed for moderate pain. Patient not taking: Reported on 12/16/2018 03/05/15   Rosalin Hawking, MD  Blood Glucose Monitoring Suppl (ACCU-CHEK AVIVA PLUS) w/Device KIT See admin instructions. 01/29/16   [provider]  loperamide (IMODIUM) 2 MG capsule Take 1 capsule (2 mg total) by mouth 4 (four) times daily as needed for diarrhea or loose stools. 05/18/18   Kirichenko, Lahoma Rocker, PA-C  oxymetazoline (AFRIN) 0.05 % nasal spray Place 1 spray into both nostrils 2 (two) times daily.    [provider]  pantoprazole (PROTONIX) 40 MG tablet Take 1 tablet (40 mg total) by mouth 2 (two) times daily. Patient taking differently: Take 40 mg by mouth 2 (two) times daily as needed (indigestion).  02/09/18   Dixie Dials, MD  valACYclovir (VALTREX) 500 MG tablet Take 500 mg by mouth daily.    [provider]     Vital Signs: BP (!) 128/55   Pulse 66   Temp 98.4 F (36.9 C) (Oral)   Resp 18   Ht '5\' 3"'  (1.6 m)   Wt 160 lb (72.6 kg)   SpO2 100%   BMI 28.34 kg/m   Physical Exam Vitals signs and nursing note reviewed.  Constitutional:      General: She is not in acute distress.    Appearance: Normal appearance.  Pulmonary:     Effort: Pulmonary effort is normal. No respiratory distress.  Skin:    General: Skin is warm and dry.     Comments: Right groin incision soft without active bleeding or hematoma.  Neurological:     Mental Status: She is alert and oriented to person, place, and  time.  Psychiatric:        Mood and Affect: Mood normal.        Behavior: Behavior normal.        Thought Content: Thought content normal.        Judgment: Judgment normal.     Imaging: Nm Gi Blood Loss  Result Date: 12/18/2018 CLINICAL DATA:  History of recent colonoscopy and multiple polypectomy (by report, greater than 10 polyps were removed) with subsequent recurrent lower GI bleeding following the re-initiation of Plavix. EXAM: NUCLEAR MEDICINE GASTROINTESTINAL BLEEDING SCAN TECHNIQUE: Sequential abdominal images were obtained following intravenous administration of  Tc-51mlabeled red blood cells. RADIOPHARMACEUTICALS:  25 mCi Tc-968mertechnetate in-vitro labeled red cells. COMPARISON:  CT abdomen pelvis-02/02/2028 FINDINGS: There is abnormal intraluminal radiotracer activity which appears to originate at the level of the hepatic flexure colon and transit through the transverse and distal sigmoid colon Physiologic activity is seen within the heart, liver, spleen and major intra-abdominal vascular structures. IMPRESSION: Examination is positive for acute lower GI bleed which appears to originate at the level of the hepatic flexure of the colon. PLAN: Patient was emergently transferred to interventional radiology for mesenteric arteriogram and potential intervention. Electronically Signed   By: JoSandi Mariscal.D.   On: 12/18/2018 12:35   Ir Angiogram Visceral Selective  Result Date: 12/18/2018 INDICATION: History of recent colonoscopy and polypectomy (reportedly at least 10 polyps removed), now with clinically significant lower GI bleeding following postprocedural re-initiation of Plavix. Patient with preceding positive tagged red blood cell study with expected location located within the hepatic flexure of the colon. Patient with baseline chronic renal insufficiency. EXAM: 1. ULTRASOUND GUIDANCE FOR ARTERIAL ACCESS. 2. SELECTIVE SUPERIOR MESENTERIC ARTERIOGRAM 3. SELECTIVE RIGHT COLIC ARTERIOGRAM 4. SELECTIVE ARTERIOGRAM OF TERTIARY BRANCH VESSEL THE RIGHT COLIC ARTERY AND PERCUTANEOUS COIL EMBOLIZATION. MEDICATIONS: NONE ANESTHESIA/SEDATION: Moderate (conscious) sedation was employed during this procedure. A total of Versed 3 mg and Fentanyl 100 mcg was administered intravenously. Moderate Sedation Time: 44 minutes. The patient's level of consciousness and vital signs were monitored continuously by radiology nursing throughout the procedure under my direct supervision. CONTRAST:  40 cc Visipaque 320 FLUOROSCOPY TIME:  11 minutes, 48 seconds (87496Gy) COMPLICATIONS: None  immediate. PROCEDURE: Informed consent was obtained from the patient following explanation of the procedure, risks, benefits and alternatives. The patient understands, agrees and consents for the procedure. All questions were addressed. A time out was performed prior to the initiation of the procedure. Maximal barrier sterile technique utilized including caps, mask, sterile gowns, sterile gloves, large sterile drape, hand hygiene, and Betadine prep. The right femoral head was marked fluoroscopically. Under ultrasound guidance, the right common femoral artery was accessed with a micropuncture kit after the overlying soft tissues were anesthetized with 1% lidocaine. An ultrasound image was saved for documentation purposes. The micropuncture sheath was exchanged for a 5 [place] sheath over a Bentson wire. A closure arteriogram was performed through the side of the sheath confirming access within the right common femoral artery. Over a Bentson wire, a Mickelson catheter was advanced to the level of the thoracic aorta where it was back bled and flushed. The catheter was then utilized to select the superior mesenteric artery and a selective superior mesenteric arteriogram was performed Next, with the use of a fathom 14 microwire, a regular Renegade microcatheter was advanced into the origin of the right colic artery and a selective right colic arteriogram was performed. Again with the use of a fathom 14 microwire, the regular  Renegade was advanced into a tertiary branch of the right colic artery a sub selective arteriogram was performed. The tertiary branch of the right colic artery supplying the location of focal extravasation into the hepatic flexure of the colon was coil embolized with 2 overlapping 2 mm x 2 cm interlock coils. The microcatheter was retracted into the more central aspect of the right colic artery and a sub selective arteriogram was performed. The microcatheter was removed and a completion  superior mesenteric arteriogram was performed. Images were reviewed and the procedure was terminated. All wires, catheters and sheaths were removed from the patient. Superficial hemostasis was achieved at the right groin access site with manual compression. A dressing was placed. The patient tolerated the procedure well without immediate postprocedural complication. FINDINGS: Selective superior mesenteric arteriogram demonstrates a focal area of contrast extravasation involving a distal tertiary branch of the right colic artery, correlating with the area of suspected active extravasation seen on preceding nuclear medicine tagged red blood cell study. Selective right colic arteriogram confirms this finding. The tertiary branch of the right colic artery was successfully catheterized with sub selective injection confirming persistent extravasation. As such, the vessel was coil embolized with 2 mm diameter interlock coils. Completion selective arteriogram is negative for residual extravasation at this location confirmed with completion superior mesenteric arteriogram. More exhaustive mesenteric arteriogram was not performed this time given correlation between the angiographic findings and the preceding tagged nuclear medicine bleeding study in light of the patient's chronic renal insufficiency. IMPRESSION: Technically successful coil embolization of extravasating tertiary branch vessel of the right colic artery correlating with the findings seen on preceding nuclear medicine tagged red blood cell bleeding scan. Electronically Signed   By: Sandi Mariscal M.D.   On: 12/18/2018 17:31   Ir Angiogram Selective Each Additional Vessel  Result Date: 12/18/2018 INDICATION: History of recent colonoscopy and polypectomy (reportedly at least 10 polyps removed), now with clinically significant lower GI bleeding following postprocedural re-initiation of Plavix. Patient with preceding positive tagged red blood cell study with  expected location located within the hepatic flexure of the colon. Patient with baseline chronic renal insufficiency. EXAM: 1. ULTRASOUND GUIDANCE FOR ARTERIAL ACCESS. 2. SELECTIVE SUPERIOR MESENTERIC ARTERIOGRAM 3. SELECTIVE RIGHT COLIC ARTERIOGRAM 4. SELECTIVE ARTERIOGRAM OF TERTIARY BRANCH VESSEL THE RIGHT COLIC ARTERY AND PERCUTANEOUS COIL EMBOLIZATION. MEDICATIONS: NONE ANESTHESIA/SEDATION: Moderate (conscious) sedation was employed during this procedure. A total of Versed 3 mg and Fentanyl 100 mcg was administered intravenously. Moderate Sedation Time: 44 minutes. The patient's level of consciousness and vital signs were monitored continuously by radiology nursing throughout the procedure under my direct supervision. CONTRAST:  40 cc Visipaque 320 FLUOROSCOPY TIME:  11 minutes, 48 seconds (282 mGy) COMPLICATIONS: None immediate. PROCEDURE: Informed consent was obtained from the patient following explanation of the procedure, risks, benefits and alternatives. The patient understands, agrees and consents for the procedure. All questions were addressed. A time out was performed prior to the initiation of the procedure. Maximal barrier sterile technique utilized including caps, mask, sterile gowns, sterile gloves, large sterile drape, hand hygiene, and Betadine prep. The right femoral head was marked fluoroscopically. Under ultrasound guidance, the right common femoral artery was accessed with a micropuncture kit after the overlying soft tissues were anesthetized with 1% lidocaine. An ultrasound image was saved for documentation purposes. The micropuncture sheath was exchanged for a 5 [place] vascular sheath over a Bentson wire. A closure arteriogram was performed through the side of the sheath confirming access within the right common femoral artery.  Over a Bentson wire, a Mickelson catheter was advanced to the level of the thoracic aorta where it was back bled and flushed. The catheter was then utilized to select  the superior mesenteric artery and a selective superior mesenteric arteriogram was performed Next, with the use of a fathom 14 microwire, a regular Renegade microcatheter was advanced into the origin of the right colic artery and a selective right colic arteriogram was performed. Again with the use of a fathom 14 microwire, the regular Renegade was advanced into a tertiary branch of the right colic artery a sub selective arteriogram was performed. The tertiary branch of the right colic artery supplying the location of focal extravasation into the hepatic flexure of the colon was coil embolized with 2 overlapping 2 mm x 2 cm interlock coils. The microcatheter was retracted into the more central aspect of the right colic artery and a sub selective arteriogram was performed. The microcatheter was removed and a completion superior mesenteric arteriogram was performed. Images were reviewed and the procedure was terminated. All wires, catheters and sheaths were removed from the patient. Superficial hemostasis was achieved at the right groin access site with manual compression. A dressing was placed. The patient tolerated the procedure well without immediate postprocedural complication. FINDINGS: Selective superior mesenteric arteriogram demonstrates a focal area of contrast extravasation involving a distal tertiary branch of the right colic artery, correlating with the area of suspected active extravasation seen on preceding nuclear medicine tagged red blood cell study. Selective right colic arteriogram confirms this finding. The tertiary branch of the right colic artery was successfully catheterized with sub selective injection confirming persistent extravasation. As such, the vessel was coil embolized with 2 mm diameter interlock coils. Completion selective arteriogram is negative for residual extravasation at this location confirmed with completion superior mesenteric arteriogram. More exhaustive mesenteric arteriogram  was not performed this time given correlation between the angiographic findings and the preceding tagged nuclear medicine bleeding study in light of the patient's chronic renal insufficiency. IMPRESSION: Technically successful coil embolization of extravasating tertiary branch vessel of the right colic artery correlating with the findings seen on preceding nuclear medicine tagged red blood cell bleeding scan. Electronically Signed   By: Sandi Mariscal M.D.   On: 12/18/2018 17:31   Ir Angiogram Selective Each Additional Vessel  Result Date: 12/18/2018 INDICATION: History of recent colonoscopy and polypectomy (reportedly at least 10 polyps removed), now with clinically significant lower GI bleeding following postprocedural re-initiation of Plavix. Patient with preceding positive tagged red blood cell study with expected location located within the hepatic flexure of the colon. Patient with baseline chronic renal insufficiency. EXAM: 1. ULTRASOUND GUIDANCE FOR ARTERIAL ACCESS. 2. SELECTIVE SUPERIOR MESENTERIC ARTERIOGRAM 3. SELECTIVE RIGHT COLIC ARTERIOGRAM 4. SELECTIVE ARTERIOGRAM OF TERTIARY BRANCH VESSEL THE RIGHT COLIC ARTERY AND PERCUTANEOUS COIL EMBOLIZATION. MEDICATIONS: NONE ANESTHESIA/SEDATION: Moderate (conscious) sedation was employed during this procedure. A total of Versed 3 mg and Fentanyl 100 mcg was administered intravenously. Moderate Sedation Time: 44 minutes. The patient's level of consciousness and vital signs were monitored continuously by radiology nursing throughout the procedure under my direct supervision. CONTRAST:  40 cc Visipaque 320 FLUOROSCOPY TIME:  11 minutes, 48 seconds (979 mGy) COMPLICATIONS: None immediate. PROCEDURE: Informed consent was obtained from the patient following explanation of the procedure, risks, benefits and alternatives. The patient understands, agrees and consents for the procedure. All questions were addressed. A time out was performed prior to the initiation of  the procedure. Maximal barrier sterile technique utilized including caps, mask,  sterile gowns, sterile gloves, large sterile drape, hand hygiene, and Betadine prep. The right femoral head was marked fluoroscopically. Under ultrasound guidance, the right common femoral artery was accessed with a micropuncture kit after the overlying soft tissues were anesthetized with 1% lidocaine. An ultrasound image was saved for documentation purposes. The micropuncture sheath was exchanged for a 5 [place] vascular sheath over a Bentson wire. A closure arteriogram was performed through the side of the sheath confirming access within the right common femoral artery. Over a Bentson wire, a Mickelson catheter was advanced to the level of the thoracic aorta where it was back bled and flushed. The catheter was then utilized to select the superior mesenteric artery and a selective superior mesenteric arteriogram was performed Next, with the use of a fathom 14 microwire, a regular Renegade microcatheter was advanced into the origin of the right colic artery and a selective right colic arteriogram was performed. Again with the use of a fathom 14 microwire, the regular Renegade was advanced into a tertiary branch of the right colic artery a sub selective arteriogram was performed. The tertiary branch of the right colic artery supplying the location of focal extravasation into the hepatic flexure of the colon was coil embolized with 2 overlapping 2 mm x 2 cm interlock coils. The microcatheter was retracted into the more central aspect of the right colic artery and a sub selective arteriogram was performed. The microcatheter was removed and a completion superior mesenteric arteriogram was performed. Images were reviewed and the procedure was terminated. All wires, catheters and sheaths were removed from the patient. Superficial hemostasis was achieved at the right groin access site with manual compression. A dressing was placed. The patient  tolerated the procedure well without immediate postprocedural complication. FINDINGS: Selective superior mesenteric arteriogram demonstrates a focal area of contrast extravasation involving a distal tertiary branch of the right colic artery, correlating with the area of suspected active extravasation seen on preceding nuclear medicine tagged red blood cell study. Selective right colic arteriogram confirms this finding. The tertiary branch of the right colic artery was successfully catheterized with sub selective injection confirming persistent extravasation. As such, the vessel was coil embolized with 2 mm diameter interlock coils. Completion selective arteriogram is negative for residual extravasation at this location confirmed with completion superior mesenteric arteriogram. More exhaustive mesenteric arteriogram was not performed this time given correlation between the angiographic findings and the preceding tagged nuclear medicine bleeding study in light of the patient's chronic renal insufficiency. IMPRESSION: Technically successful coil embolization of extravasating tertiary branch vessel of the right colic artery correlating with the findings seen on preceding nuclear medicine tagged red blood cell bleeding scan. Electronically Signed   By: Sandi Mariscal M.D.   On: 12/18/2018 17:31   Ir Angiogram Selective Each Additional Vessel  Result Date: 12/18/2018 INDICATION: History of recent colonoscopy and polypectomy (reportedly at least 10 polyps removed), now with clinically significant lower GI bleeding following postprocedural re-initiation of Plavix. Patient with preceding positive tagged red blood cell study with expected location located within the hepatic flexure of the colon. Patient with baseline chronic renal insufficiency. EXAM: 1. ULTRASOUND GUIDANCE FOR ARTERIAL ACCESS. 2. SELECTIVE SUPERIOR MESENTERIC ARTERIOGRAM 3. SELECTIVE RIGHT COLIC ARTERIOGRAM 4. SELECTIVE ARTERIOGRAM OF TERTIARY BRANCH  VESSEL THE RIGHT COLIC ARTERY AND PERCUTANEOUS COIL EMBOLIZATION. MEDICATIONS: NONE ANESTHESIA/SEDATION: Moderate (conscious) sedation was employed during this procedure. A total of Versed 3 mg and Fentanyl 100 mcg was administered intravenously. Moderate Sedation Time: 44 minutes. The patient's level of consciousness  and vital signs were monitored continuously by radiology nursing throughout the procedure under my direct supervision. CONTRAST:  40 cc Visipaque 320 FLUOROSCOPY TIME:  11 minutes, 48 seconds (681 mGy) COMPLICATIONS: None immediate. PROCEDURE: Informed consent was obtained from the patient following explanation of the procedure, risks, benefits and alternatives. The patient understands, agrees and consents for the procedure. All questions were addressed. A time out was performed prior to the initiation of the procedure. Maximal barrier sterile technique utilized including caps, mask, sterile gowns, sterile gloves, large sterile drape, hand hygiene, and Betadine prep. The right femoral head was marked fluoroscopically. Under ultrasound guidance, the right common femoral artery was accessed with a micropuncture kit after the overlying soft tissues were anesthetized with 1% lidocaine. An ultrasound image was saved for documentation purposes. The micropuncture sheath was exchanged for a 5 [place] vascular sheath over a Bentson wire. A closure arteriogram was performed through the side of the sheath confirming access within the right common femoral artery. Over a Bentson wire, a Mickelson catheter was advanced to the level of the thoracic aorta where it was back bled and flushed. The catheter was then utilized to select the superior mesenteric artery and a selective superior mesenteric arteriogram was performed Next, with the use of a fathom 14 microwire, a regular Renegade microcatheter was advanced into the origin of the right colic artery and a selective right colic arteriogram was performed. Again with  the use of a fathom 14 microwire, the regular Renegade was advanced into a tertiary branch of the right colic artery a sub selective arteriogram was performed. The tertiary branch of the right colic artery supplying the location of focal extravasation into the hepatic flexure of the colon was coil embolized with 2 overlapping 2 mm x 2 cm interlock coils. The microcatheter was retracted into the more central aspect of the right colic artery and a sub selective arteriogram was performed. The microcatheter was removed and a completion superior mesenteric arteriogram was performed. Images were reviewed and the procedure was terminated. All wires, catheters and sheaths were removed from the patient. Superficial hemostasis was achieved at the right groin access site with manual compression. A dressing was placed. The patient tolerated the procedure well without immediate postprocedural complication. FINDINGS: Selective superior mesenteric arteriogram demonstrates a focal area of contrast extravasation involving a distal tertiary branch of the right colic artery, correlating with the area of suspected active extravasation seen on preceding nuclear medicine tagged red blood cell study. Selective right colic arteriogram confirms this finding. The tertiary branch of the right colic artery was successfully catheterized with sub selective injection confirming persistent extravasation. As such, the vessel was coil embolized with 2 mm diameter interlock coils. Completion selective arteriogram is negative for residual extravasation at this location confirmed with completion superior mesenteric arteriogram. More exhaustive mesenteric arteriogram was not performed this time given correlation between the angiographic findings and the preceding tagged nuclear medicine bleeding study in light of the patient's chronic renal insufficiency. IMPRESSION: Technically successful coil embolization of extravasating tertiary branch vessel of the  right colic artery correlating with the findings seen on preceding nuclear medicine tagged red blood cell bleeding scan. Electronically Signed   By: Sandi Mariscal M.D.   On: 12/18/2018 17:31   Ir Angiogram Selective Each Additional Vessel  Result Date: 12/18/2018 INDICATION: History of recent colonoscopy and polypectomy (reportedly at least 10 polyps removed), now with clinically significant lower GI bleeding following postprocedural re-initiation of Plavix. Patient with preceding positive tagged red blood cell  study with expected location located within the hepatic flexure of the colon. Patient with baseline chronic renal insufficiency. EXAM: 1. ULTRASOUND GUIDANCE FOR ARTERIAL ACCESS. 2. SELECTIVE SUPERIOR MESENTERIC ARTERIOGRAM 3. SELECTIVE RIGHT COLIC ARTERIOGRAM 4. SELECTIVE ARTERIOGRAM OF TERTIARY BRANCH VESSEL THE RIGHT COLIC ARTERY AND PERCUTANEOUS COIL EMBOLIZATION. MEDICATIONS: NONE ANESTHESIA/SEDATION: Moderate (conscious) sedation was employed during this procedure. A total of Versed 3 mg and Fentanyl 100 mcg was administered intravenously. Moderate Sedation Time: 44 minutes. The patient's level of consciousness and vital signs were monitored continuously by radiology nursing throughout the procedure under my direct supervision. CONTRAST:  40 cc Visipaque 320 FLUOROSCOPY TIME:  11 minutes, 48 seconds (528 mGy) COMPLICATIONS: None immediate. PROCEDURE: Informed consent was obtained from the patient following explanation of the procedure, risks, benefits and alternatives. The patient understands, agrees and consents for the procedure. All questions were addressed. A time out was performed prior to the initiation of the procedure. Maximal barrier sterile technique utilized including caps, mask, sterile gowns, sterile gloves, large sterile drape, hand hygiene, and Betadine prep. The right femoral head was marked fluoroscopically. Under ultrasound guidance, the right common femoral artery was accessed with  a micropuncture kit after the overlying soft tissues were anesthetized with 1% lidocaine. An ultrasound image was saved for documentation purposes. The micropuncture sheath was exchanged for a 5 [place] vascular sheath over a Bentson wire. A closure arteriogram was performed through the side of the sheath confirming access within the right common femoral artery. Over a Bentson wire, a Mickelson catheter was advanced to the level of the thoracic aorta where it was back bled and flushed. The catheter was then utilized to select the superior mesenteric artery and a selective superior mesenteric arteriogram was performed Next, with the use of a fathom 14 microwire, a regular Renegade microcatheter was advanced into the origin of the right colic artery and a selective right colic arteriogram was performed. Again with the use of a fathom 14 microwire, the regular Renegade was advanced into a tertiary branch of the right colic artery a sub selective arteriogram was performed. The tertiary branch of the right colic artery supplying the location of focal extravasation into the hepatic flexure of the colon was coil embolized with 2 overlapping 2 mm x 2 cm interlock coils. The microcatheter was retracted into the more central aspect of the right colic artery and a sub selective arteriogram was performed. The microcatheter was removed and a completion superior mesenteric arteriogram was performed. Images were reviewed and the procedure was terminated. All wires, catheters and sheaths were removed from the patient. Superficial hemostasis was achieved at the right groin access site with manual compression. A dressing was placed. The patient tolerated the procedure well without immediate postprocedural complication. FINDINGS: Selective superior mesenteric arteriogram demonstrates a focal area of contrast extravasation involving a distal tertiary branch of the right colic artery, correlating with the area of suspected active  extravasation seen on preceding nuclear medicine tagged red blood cell study. Selective right colic arteriogram confirms this finding. The tertiary branch of the right colic artery was successfully catheterized with sub selective injection confirming persistent extravasation. As such, the vessel was coil embolized with 2 mm diameter interlock coils. Completion selective arteriogram is negative for residual extravasation at this location confirmed with completion superior mesenteric arteriogram. More exhaustive mesenteric arteriogram was not performed this time given correlation between the angiographic findings and the preceding tagged nuclear medicine bleeding study in light of the patient's chronic renal insufficiency. IMPRESSION: Technically successful coil embolization  of extravasating tertiary branch vessel of the right colic artery correlating with the findings seen on preceding nuclear medicine tagged red blood cell bleeding scan. Electronically Signed   By: Sandi Mariscal M.D.   On: 12/18/2018 17:31   [facility] US Guide Vasc Access Right  Result Date: 12/18/2018 INDICATION: History of recent colonoscopy and polypectomy (reportedly at least 10 polyps removed), now with clinically significant lower GI bleeding following postprocedural re-initiation of Plavix. Patient with preceding positive tagged red blood cell study with expected location located within the hepatic flexure of the colon. Patient with baseline chronic renal insufficiency. EXAM: 1. ULTRASOUND GUIDANCE FOR ARTERIAL ACCESS. 2. SELECTIVE SUPERIOR MESENTERIC ARTERIOGRAM 3. SELECTIVE RIGHT COLIC ARTERIOGRAM 4. SELECTIVE ARTERIOGRAM OF TERTIARY BRANCH VESSEL THE RIGHT COLIC ARTERY AND PERCUTANEOUS COIL EMBOLIZATION. MEDICATIONS: NONE ANESTHESIA/SEDATION: Moderate (conscious) sedation was employed during this procedure. A total of Versed 3 mg and Fentanyl 100 mcg was administered intravenously. Moderate Sedation Time: 44 minutes. The patient's level of  consciousness and vital signs were monitored continuously by radiology nursing throughout the procedure under my direct supervision. CONTRAST:  40 cc Visipaque 320 FLUOROSCOPY TIME:  11 minutes, 48 seconds (086 mGy) COMPLICATIONS: None immediate. PROCEDURE: Informed consent was obtained from the patient following explanation of the procedure, risks, benefits and alternatives. The patient understands, agrees and consents for the procedure. All questions were addressed. A time out was performed prior to the initiation of the procedure. Maximal barrier sterile technique utilized including caps, mask, sterile gowns, sterile gloves, large sterile drape, hand hygiene, and Betadine prep. The right femoral head was marked fluoroscopically. Under ultrasound guidance, the right common femoral artery was accessed with a micropuncture kit after the overlying soft tissues were anesthetized with 1% lidocaine. An ultrasound image was saved for documentation purposes. The micropuncture sheath was exchanged for a 5 [place] vascular sheath over a Bentson wire. A closure arteriogram was performed through the side of the sheath confirming access within the right common femoral artery. Over a Bentson wire, a Mickelson catheter was advanced to the level of the thoracic aorta where it was back bled and flushed. The catheter was then utilized to select the superior mesenteric artery and a selective superior mesenteric arteriogram was performed Next, with the use of a fathom 14 microwire, a regular Renegade microcatheter was advanced into the origin of the right colic artery and a selective right colic arteriogram was performed. Again with the use of a fathom 14 microwire, the regular Renegade was advanced into a tertiary branch of the right colic artery a sub selective arteriogram was performed. The tertiary branch of the right colic artery supplying the location of focal extravasation into the hepatic flexure of the colon was coil  embolized with 2 overlapping 2 mm x 2 cm interlock coils. The microcatheter was retracted into the more central aspect of the right colic artery and a sub selective arteriogram was performed. The microcatheter was removed and a completion superior mesenteric arteriogram was performed. Images were reviewed and the procedure was terminated. All wires, catheters and sheaths were removed from the patient. Superficial hemostasis was achieved at the right groin access site with manual compression. A dressing was placed. The patient tolerated the procedure well without immediate postprocedural complication. FINDINGS: Selective superior mesenteric arteriogram demonstrates a focal area of contrast extravasation involving a distal tertiary branch of the right colic artery, correlating with the area of suspected active extravasation seen on preceding nuclear medicine tagged red blood cell study. Selective right colic arteriogram confirms this finding.  The tertiary branch of the right colic artery was successfully catheterized with sub selective injection confirming persistent extravasation. As such, the vessel was coil embolized with 2 mm diameter interlock coils. Completion selective arteriogram is negative for residual extravasation at this location confirmed with completion superior mesenteric arteriogram. More exhaustive mesenteric arteriogram was not performed this time given correlation between the angiographic findings and the preceding tagged nuclear medicine bleeding study in light of the patient's chronic renal insufficiency. IMPRESSION: Technically successful coil embolization of extravasating tertiary branch vessel of the right colic artery correlating with the findings seen on preceding nuclear medicine tagged red blood cell bleeding scan. Electronically Signed   By: Sandi Mariscal M.D.   On: 12/18/2018 17:31   [place] Guide Roadmapping  Result Date: 12/18/2018 INDICATION:  History of recent colonoscopy and polypectomy (reportedly at least 10 polyps removed), now with clinically significant lower GI bleeding following postprocedural re-initiation of Plavix. Patient with preceding positive tagged red blood cell study with expected location located within the hepatic flexure of the colon. Patient with baseline chronic renal insufficiency. EXAM: 1. ULTRASOUND GUIDANCE FOR ARTERIAL ACCESS. 2. SELECTIVE SUPERIOR MESENTERIC ARTERIOGRAM 3. SELECTIVE RIGHT COLIC ARTERIOGRAM 4. SELECTIVE ARTERIOGRAM OF TERTIARY BRANCH VESSEL THE RIGHT COLIC ARTERY AND PERCUTANEOUS COIL EMBOLIZATION. MEDICATIONS: NONE ANESTHESIA/SEDATION: Moderate (conscious) sedation was employed during this procedure. A total of Versed 3 mg and Fentanyl 100 mcg was administered intravenously. Moderate Sedation Time: 44 minutes. The patient's level of consciousness and vital signs were monitored continuously by radiology nursing throughout the procedure under my direct supervision. CONTRAST:  40 cc Visipaque 320 FLUOROSCOPY TIME:  11 minutes, 48 seconds (536 mGy) COMPLICATIONS: None immediate. PROCEDURE: Informed consent was obtained from the patient following explanation of the procedure, risks, benefits and alternatives. The patient understands, agrees and consents for the procedure. All questions were addressed. A time out was performed prior to the initiation of the procedure. Maximal barrier sterile technique utilized including caps, mask, sterile gowns, sterile gloves, large sterile drape, hand hygiene, and Betadine prep. The right femoral head was marked fluoroscopically. Under ultrasound guidance, the right common femoral artery was accessed with a micropuncture kit after the overlying soft tissues were anesthetized with 1% lidocaine. An ultrasound image was saved for documentation purposes. The micropuncture sheath was exchanged for a 5 [place] vascular sheath over a Bentson wire. A closure arteriogram was performed  through the side of the sheath confirming access within the right common femoral artery. Over a Bentson wire, a Mickelson catheter was advanced to the level of the thoracic aorta where it was back bled and flushed. The catheter was then utilized to select the superior mesenteric artery and a selective superior mesenteric arteriogram was performed Next, with the use of a fathom 14 microwire, a regular Renegade microcatheter was advanced into the origin of the right colic artery and a selective right colic arteriogram was performed. Again with the use of a fathom 14 microwire, the regular Renegade was advanced into a tertiary branch of the right colic artery a sub selective arteriogram was performed. The tertiary branch of the right colic artery supplying the location of focal extravasation into the hepatic flexure of the colon was coil embolized with 2 overlapping 2 mm x 2 cm interlock coils. The microcatheter was retracted into the more central aspect of the right colic artery and a sub selective arteriogram was performed. The microcatheter was removed and a completion superior mesenteric arteriogram was performed. Images were  reviewed and the procedure was terminated. All wires, catheters and sheaths were removed from the patient. Superficial hemostasis was achieved at the right groin access site with manual compression. A dressing was placed. The patient tolerated the procedure well without immediate postprocedural complication. FINDINGS: Selective superior mesenteric arteriogram demonstrates a focal area of contrast extravasation involving a distal tertiary branch of the right colic artery, correlating with the area of suspected active extravasation seen on preceding nuclear medicine tagged red blood cell study. Selective right colic arteriogram confirms this finding. The tertiary branch of the right colic artery was successfully catheterized with sub selective injection confirming persistent extravasation. As  such, the vessel was coil embolized with 2 mm diameter interlock coils. Completion selective arteriogram is negative for residual extravasation at this location confirmed with completion superior mesenteric arteriogram. More exhaustive mesenteric arteriogram was not performed this time given correlation between the angiographic findings and the preceding tagged nuclear medicine bleeding study in light of the patient's chronic renal insufficiency. IMPRESSION: Technically successful coil embolization of extravasating tertiary branch vessel of the right colic artery correlating with the findings seen on preceding nuclear medicine tagged red blood cell bleeding scan. Electronically Signed   By: Sandi Mariscal M.D.   On: 12/18/2018 17:31    Labs:  CBC: Recent Labs    12/17/18 1819 12/18/18 0138 12/18/18 1627 12/19/18 1106 12/19/18 1839 12/20/18 0518  WBC 9.5 8.7 14.6*  --   --  10.4  HGB 8.6* 8.5* 10.1* 8.1* 7.8* 8.2*  HCT 26.3* 25.9* 30.7* 25.0* 23.8* 25.8*  PLT 274 258 266  --   --  272    COAGS: Recent Labs    12/16/18 1416  INR 1.09    BMP: Recent Labs    05/18/18 1028 12/16/18 1408 12/16/18 1416 12/17/18 0136 12/20/18 0315  NA 141 143 147* 141 141  K 3.7 4.2 4.2 3.9 3.3*  CL 105 117* 117* 117* 117*  CO2 19*  --  17* 16* 14*  GLUCOSE 77 119* 125* 158* 90  BUN 26* 25* 25* 23 14  CALCIUM 10.9*  --  9.0 8.0* 8.3*  CREATININE 2.00* 1.80* 1.79* 1.83* 1.20*  GFRNONAA 26*  --  30* 29* 48*  GFRAA 30*  --  34* 33* 56*    LIVER FUNCTION TESTS: Recent Labs    02/23/18 0846 04/13/18 1608 05/18/18 1028 12/16/18 1416  BILITOT 0.8 0.8 0.9 0.5  AST '18 18 22 ' 13*  ALT 10* 10* 13* 9  ALKPHOS 135* 165* 148* 150*  PROT 7.0 7.0 7.2 5.7*  ALBUMIN 3.4* 3.5 3.6 2.9*    Assessment and Plan:  Lower GI bleed s/p coil embolization of extravasating tertiary branch vessel of the right colic artery [id number]/78/6767 by Dr. Pascal Lux. Hgb increased from yesterday- recommend following daily  Hgb. Right groin incision stable. Plans per primary team, appreciate/agree with their management. Please call IR with questions/concerns.   Electronically Signed: Earley Abide, PA-C 12/20/2018, 2:21 PM   I spent a total of 25 Minutes at the the patient's bedside AND on the patient's hospital floor or unit, greater than 50% of which was counseling/coordinating care for lower GI bleed s/p embolization.

## 2018-12-20 NOTE — Progress Notes (Signed)
Ref: Dixie Dials, MD   Subjective:  Denies abdominal pain but has back pain. Old blood clots per rectum. Hgb 8.2.  Objective:  Vital Signs in the last 24 hours: Temp:  [97.6 F (36.4 C)-98.6 F (37 C)] 97.8 F (36.6 C) (12/26 0800) Pulse Rate:  [59-84] 81 (12/26 0927) Cardiac Rhythm: Normal sinus rhythm (12/26 0800) Resp:  [12-24] 13 (12/26 0927) BP: (120-187)/(29-108) 186/79 (12/26 0927) SpO2:  [95 %-100 %] 95 % (12/26 0927)  Physical Exam: BP Readings from Last 1 Encounters:  12/20/18 (!) 186/79     Wt Readings from Last 1 Encounters:  12/16/18 72.6 kg    Weight change:  Body mass index is 28.34 kg/m. HEENT: [place]/AT, Eyes-Brown, PERL, EOMI, Conjunctiva-Pale, Sclera-Non-icteric Neck: No JVD, No bruit, Trachea midline. Lungs:  Clear, Bilateral. Cardiac:  Regular rhythm, normal S1 and S2, no S3. II/VI systolic murmur. Abdomen:  Soft, non-tender. BS present. Extremities:  No edema present. No cyanosis. No clubbing. CNS: AxOx3, Cranial nerves grossly intact, moves all 4 extremities.  Skin: Warm and dry.   Intake/Output from previous day: 12/25 0701 - 12/26 0700 In: 973.2 [I.V.:973.2] Out: 1150 [Urine:1150]    Lab Results: BMET    Component Value Date/Time   NA 141 12/20/2018 0315   NA 141 12/17/2018 0136   NA 147 (H) 12/16/2018 1416   K 3.3 (L) 12/20/2018 0315   K 3.9 12/17/2018 0136   K 4.2 12/16/2018 1416   CL 117 (H) 12/20/2018 0315   CL 117 (H) 12/17/2018 0136   CL 117 (H) 12/16/2018 1416   CO2 14 (L) 12/20/2018 0315   CO2 16 (L) 12/17/2018 0136   CO2 17 (L) 12/16/2018 1416   GLUCOSE 90 12/20/2018 0315   GLUCOSE 158 (H) 12/17/2018 0136   GLUCOSE 125 (H) 12/16/2018 1416   BUN 14 12/20/2018 0315   BUN 23 12/17/2018 0136   BUN 25 (H) 12/16/2018 1416   CREATININE 1.20 (H) 12/20/2018 0315   CREATININE 1.83 (H) 12/17/2018 0136   CREATININE 1.79 (H) 12/16/2018 1416   CALCIUM 8.3 (L) 12/20/2018 0315   CALCIUM 8.0 (L) 12/17/2018 0136   CALCIUM 9.0  12/16/2018 1416   GFRNONAA 48 (L) 12/20/2018 0315   GFRNONAA 29 (L) 12/17/2018 0136   GFRNONAA 30 (L) 12/16/2018 1416   GFRAA 56 (L) 12/20/2018 0315   GFRAA 33 (L) 12/17/2018 0136   GFRAA 34 (L) 12/16/2018 1416   CBC    Component Value Date/Time   WBC 10.4 12/20/2018 0518   RBC 2.76 (L) 12/20/2018 0518   HGB 8.2 (L) 12/20/2018 0518   HCT 25.8 (L) 12/20/2018 0518   PLT 272 12/20/2018 0518   MCV 93.5 12/20/2018 0518   MCH 29.7 12/20/2018 0518   MCHC 31.8 12/20/2018 0518   RDW 15.6 (H) 12/20/2018 0518   LYMPHSABS 1.3 12/16/2018 1416   MONOABS 0.6 12/16/2018 1416   EOSABS 0.2 12/16/2018 1416   BASOSABS 0.1 12/16/2018 1416   HEPATIC Function Panel Recent Labs    04/13/18 1608 05/18/18 1028 12/16/18 1416  PROT 7.0 7.2 5.7*   HEMOGLOBIN A1C No components found for: HGA1C,  MPG CARDIAC ENZYMES Lab Results  Component Value Date   CKTOTAL 735 (H) 02/01/2018   BNP No results for input(s): PROBNP in the last 8760 hours. TSH Recent Labs    05/18/18 1029  TSH 1.464   CHOLESTEROL No results for input(s): CHOL in the last 8760 hours.  Scheduled Meds: . amLODipine  5 mg Oral Daily  . insulin  aspart  0-9 Units Subcutaneous TID WC  . metoprolol tartrate  12.5 mg Oral BID  . pantoprazole (PROTONIX) IV  40 mg Intravenous Q24H  . polyethylene glycol-electrolytes  4,000 mL Oral Once  . sodium chloride flush  3 mL Intravenous Q12H   Continuous Infusions: . sodium chloride    . sodium chloride 50 mL/hr at 12/20/18 0554   PRN Meds:.acetaminophen, ALPRAZolam, hydrALAZINE, ondansetron (ZOFRAN) IV  Assessment/Plan: Acute lower GI bleed, recurrent S/P Colonoscopy and polypectomy. S/P mesenteric angiogram and coil embolization of tertiary branch of right colic artery Acute blood loss anemia CAD S/P CABG S/P bioprosthetic AV replacement Hypertension DM, II Hyperlipidemia Tobacco abuse Depression CKD, III  Appreciate GI consult. Continue medical treatment.   LOS: 4  days    Dixie Dials  MD  12/20/2018, 10:54 AM

## 2018-12-21 LAB — GLUCOSE, CAPILLARY
GLUCOSE-CAPILLARY: 93 mg/dL (ref 70–99)
Glucose-Capillary: 108 mg/dL — ABNORMAL HIGH (ref 70–99)
Glucose-Capillary: 84 mg/dL (ref 70–99)
Glucose-Capillary: 102 mg/dL — ABNORMAL HIGH (ref 70–99)

## 2018-12-21 MED ORDER — ALPRAZOLAM 0.25 MG PO TABS
0.2500 mg | ORAL_TABLET | Freq: Two times a day (BID) | ORAL | Status: DC | PRN
  Administered 2018-12-21 – 2018-12-25 (×8): 0.25 mg via ORAL
  Filled 2018-12-21 (×9): qty 1

## 2018-12-21 MED ORDER — POTASSIUM CHLORIDE CRYS ER 10 MEQ PO TBCR
10.0000 meq | EXTENDED_RELEASE_TABLET | Freq: Two times a day (BID) | ORAL | Status: DC
  Administered 2018-12-21 – 2018-12-25 (×9): 10 meq via ORAL
  Filled 2018-12-21 (×10): qty 1

## 2018-12-21 NOTE — Progress Notes (Signed)
Subjective: Small passage old blood; no obvious active bleeding. No abdominal pain.  Objective: Vital signs in last 24 hours: Temp:  [97.6 F (36.4 C)-99.1 F (37.3 C)] 97.6 F (36.4 C) (12/27 0800) Pulse Rate:  [62-87] 85 (12/27 0900) Resp:  [11-22] 18 (12/27 0900) BP: (119-185)/(34-67) 185/59 (12/27 0900) SpO2:  [98 %-100 %] 99 % (12/27 0900) Weight change:  Last BM Date: 12/21/18  PE: GEN:  NAD, pale  Lab Results: CBC    Component Value Date/Time   WBC 10.4 12/20/2018 0518   RBC 2.76 (L) 12/20/2018 0518   HGB 8.2 (L) 12/20/2018 0518   HCT 25.8 (L) 12/20/2018 0518   PLT 272 12/20/2018 0518   MCV 93.5 12/20/2018 0518   MCH 29.7 12/20/2018 0518   MCHC 31.8 12/20/2018 0518   RDW 15.6 (H) 12/20/2018 0518   LYMPHSABS 1.3 12/16/2018 1416   MONOABS 0.6 12/16/2018 1416   EOSABS 0.2 12/16/2018 1416   BASOSABS 0.1 12/16/2018 1416   Assessment:  1.  Post polypectomy bleeding.  Has had embolization few days ago.  Do not suspect active ongoing bleeding. 2.  Acute blood loss anemia.  Hgb stable. 3.  Chronic anticoagulation.  On hold.  Plan:  1.  Advance to full liquids. 2.  OOBTC/mobilize as tolerated. 3.  No anticoagulation for at least the next 2 weeks is advised from GI perspective, if feasible for patient's other medical concerns. 4.  Follow CBC. 5.  Possibly transition to less acute level-of-care, per primary team. 6.  [facility] will follow.   Landry Dyke 12/21/2018, 9:35 AM   Cell [phone] If no answer or after 5 PM call [phone]

## 2018-12-21 NOTE — Progress Notes (Signed)
Ref: Dixie Dials, MD   Subjective:  Awake. No chest pain or abdominal pain. Afebrile. No GI bleed. Hgb is 8.2 g.  Objective:  Vital Signs in the last 24 hours: Temp:  [97.6 F (36.4 C)-99.1 F (37.3 C)] 98.2 F (36.8 C) (12/27 1200) Pulse Rate:  [62-87] 66 (12/27 1200) Cardiac Rhythm: Normal sinus rhythm;Heart block (12/27 0900) Resp:  [10-21] 15 (12/27 1200) BP: (119-185)/(34-67) 141/55 (12/27 1200) SpO2:  [98 %-100 %] 100 % (12/27 1200)  Physical Exam: BP Readings from Last 1 Encounters:  12/21/18 (!) 141/55     Wt Readings from Last 1 Encounters:  12/16/18 72.6 kg    Weight change:  Body mass index is 28.34 kg/m. HEENT: [place]/AT, Eyes-Brown, PERL, EOMI, Conjunctiva-Pale, Sclera-Non-icteric Neck: No JVD, No bruit, Trachea midline. Lungs:  Clear, Bilateral. Cardiac:  Regular rhythm, normal S1 and S2, no S3. II/VI systolic murmur. Abdomen:  Soft, non-tender. BS present. Extremities:  No edema present. No cyanosis. No clubbing. CNS: AxOx3, Cranial nerves grossly intact, moves all 4 extremities.  Skin: Warm and dry.   Intake/Output from previous day: 12/26 0701 - 12/27 0700 In: 1621.8 [P.O.:480; I.V.:1141.8] Out: 1000 [Urine:1000]    Lab Results: BMET    Component Value Date/Time   NA 141 12/20/2018 0315   NA 141 12/17/2018 0136   NA 147 (H) 12/16/2018 1416   K 3.3 (L) 12/20/2018 0315   K 3.9 12/17/2018 0136   K 4.2 12/16/2018 1416   CL 117 (H) 12/20/2018 0315   CL 117 (H) 12/17/2018 0136   CL 117 (H) 12/16/2018 1416   CO2 14 (L) 12/20/2018 0315   CO2 16 (L) 12/17/2018 0136   CO2 17 (L) 12/16/2018 1416   GLUCOSE 90 12/20/2018 0315   GLUCOSE 158 (H) 12/17/2018 0136   GLUCOSE 125 (H) 12/16/2018 1416   BUN 14 12/20/2018 0315   BUN 23 12/17/2018 0136   BUN 25 (H) 12/16/2018 1416   CREATININE 1.20 (H) 12/20/2018 0315   CREATININE 1.83 (H) 12/17/2018 0136   CREATININE 1.79 (H) 12/16/2018 1416   CALCIUM 8.3 (L) 12/20/2018 0315   CALCIUM 8.0 (L) 12/17/2018  0136   CALCIUM 9.0 12/16/2018 1416   GFRNONAA 48 (L) 12/20/2018 0315   GFRNONAA 29 (L) 12/17/2018 0136   GFRNONAA 30 (L) 12/16/2018 1416   GFRAA 56 (L) 12/20/2018 0315   GFRAA 33 (L) 12/17/2018 0136   GFRAA 34 (L) 12/16/2018 1416   CBC    Component Value Date/Time   WBC 10.4 12/20/2018 0518   RBC 2.76 (L) 12/20/2018 0518   HGB 8.2 (L) 12/20/2018 0518   HCT 25.8 (L) 12/20/2018 0518   PLT 272 12/20/2018 0518   MCV 93.5 12/20/2018 0518   MCH 29.7 12/20/2018 0518   MCHC 31.8 12/20/2018 0518   RDW 15.6 (H) 12/20/2018 0518   LYMPHSABS 1.3 12/16/2018 1416   MONOABS 0.6 12/16/2018 1416   EOSABS 0.2 12/16/2018 1416   BASOSABS 0.1 12/16/2018 1416   HEPATIC Function Panel Recent Labs    04/13/18 1608 05/18/18 1028 12/16/18 1416  PROT 7.0 7.2 5.7*   HEMOGLOBIN A1C No components found for: HGA1C,  MPG CARDIAC ENZYMES Lab Results  Component Value Date   CKTOTAL 735 (H) 02/01/2018   BNP No results for input(s): PROBNP in the last 8760 hours. TSH Recent Labs    05/18/18 1029  TSH 1.464   CHOLESTEROL No results for input(s): CHOL in the last 8760 hours.  Scheduled Meds: . amLODipine  5 mg Oral Daily  .  insulin aspart  0-9 Units Subcutaneous TID WC  . metoprolol tartrate  12.5 mg Oral BID  . pantoprazole (PROTONIX) IV  40 mg Intravenous Q24H  . polyethylene glycol-electrolytes  4,000 mL Oral Once  . potassium chloride  10 mEq Oral BID  . sodium chloride flush  3 mL Intravenous Q12H   Continuous Infusions: . sodium chloride    . sodium chloride 50 mL/hr at 12/21/18 1040   PRN Meds:.acetaminophen, ALPRAZolam, hydrALAZINE, ondansetron (ZOFRAN) IV  Assessment/Plan: Acute lower GI bleed, recurrent S/P Colonoscopy and polypectomy S/P mesenteric angiogram and tertiary branch of right Colic artery embolization with coil Acute blood loss anemia CAD CABG HTN DM, II Hyperlipidemia Tobacco use disorder Depression CKD, III  Advance diet per GI. Transfer to  tele-bed.   LOS: 5 days    Dixie Dials  MD  12/21/2018, 1:19 PM

## 2018-12-22 LAB — COMPREHENSIVE METABOLIC PANEL
ALT: 10 U/L (ref 0–44)
AST: 13 U/L — ABNORMAL LOW (ref 15–41)
Albumin: 2.5 g/dL — ABNORMAL LOW (ref 3.5–5.0)
Alkaline Phosphatase: 95 U/L (ref 38–126)
Anion gap: 8 (ref 5–15)
BUN: 8 mg/dL (ref 8–23)
CO2: 20 mmol/L — ABNORMAL LOW (ref 22–32)
Calcium: 8.5 mg/dL — ABNORMAL LOW (ref 8.9–10.3)
Chloride: 114 mmol/L — ABNORMAL HIGH (ref 98–111)
Creatinine, Ser: 1.18 mg/dL — ABNORMAL HIGH (ref 0.44–1.00)
GFR calc Af Amer: 57 mL/min — ABNORMAL LOW (ref >60)
GFR, EST NON AFRICAN AMERICAN: 49 mL/min — AB (ref >60)
Glucose, Bld: 97 mg/dL (ref 70–99)
Potassium: 3.7 mmol/L (ref 3.5–5.1)
Sodium: 142 mmol/L (ref 135–145)
Total Bilirubin: 0.3 mg/dL (ref 0.3–1.2)
Total Protein: 4.6 g/dL — ABNORMAL LOW (ref 6.5–8.1)

## 2018-12-22 LAB — MAGNESIUM: MAGNESIUM: 1.6 mg/dL — AB (ref 1.7–2.4)

## 2018-12-22 LAB — GLUCOSE, CAPILLARY
Glucose-Capillary: 110 mg/dL — ABNORMAL HIGH (ref 70–99)
Glucose-Capillary: 101 mg/dL — ABNORMAL HIGH (ref 70–99)
Glucose-Capillary: 119 mg/dL — ABNORMAL HIGH (ref 70–99)
Glucose-Capillary: 131 mg/dL — ABNORMAL HIGH (ref 70–99)

## 2018-12-22 LAB — CBC
HCT: 23.3 % — ABNORMAL LOW (ref 36.0–46.0)
Hemoglobin: 7.3 g/dL — ABNORMAL LOW (ref 12.0–15.0)
MCH: 29.4 pg (ref 26.0–34.0)
MCHC: 31.3 g/dL (ref 30.0–36.0)
MCV: 94 fL (ref 80.0–100.0)
Platelets: 297 10*3/uL (ref 150–400)
RBC: 2.48 MIL/uL — ABNORMAL LOW (ref 3.87–5.11)
RDW: 16.6 % — ABNORMAL HIGH (ref 11.5–15.5)
WBC: 6 10*3/uL (ref 4.0–10.5)
nRBC: 0 % (ref 0.0–0.2)

## 2018-12-22 MED ORDER — TIZANIDINE HCL 4 MG PO TABS
2.0000 mg | ORAL_TABLET | Freq: Three times a day (TID) | ORAL | Status: DC
  Administered 2018-12-22 – 2018-12-25 (×10): 2 mg via ORAL
  Filled 2018-12-22 (×10): qty 1

## 2018-12-22 NOTE — Progress Notes (Signed)
Ref: Dixie Dials, MD   Subjective:  Hgb 7.3 g. Afebrile. Right sided hip area pain.  Objective:  Vital Signs in the last 24 hours: Temp:  [97.9 F (36.6 C)-98.6 F (37 C)] 98.6 F (37 C) (12/28 1347) Pulse Rate:  [67-77] 71 (12/28 1347) Resp:  [16-22] 18 (12/28 1347) BP: (124-144)/(58-71) 140/70 (12/28 1347) SpO2:  [97 %-100 %] 100 % (12/28 1347)  Physical Exam: BP Readings from Last 1 Encounters:  12/22/18 140/70     Wt Readings from Last 1 Encounters:  12/16/18 72.6 kg    Weight change:  Body mass index is 28.34 kg/m. HEENT: /AT, Eyes-Brown, PERL, EOMI, Conjunctiva-Pale, Sclera-Non-icteric Neck: No JVD, No bruit, Trachea midline. Lungs:  Clear, Bilateral. Cardiac:  Regular rhythm, normal S1 and S2, no S3. II/VI systolic murmur. Abdomen:  Soft, non-tender. BS present. Extremities:  No edema present. No cyanosis. No clubbing. Tenderness over right lumbar and hip area on palpation. CNS: AxOx3, Cranial nerves grossly intact, moves all 4 extremities.  Skin: Warm and dry.   Intake/Output from previous day: 12/27 0701 - 12/28 0700 In: 860.1 [P.O.:360; I.V.:500.1] Out: 600 [Urine:600]    Lab Results: BMET    Component Value Date/Time   NA 142 12/22/2018 0513   NA 141 12/20/2018 0315   NA 141 12/17/2018 0136   K 3.7 12/22/2018 0513   K 3.3 (L) 12/20/2018 0315   K 3.9 12/17/2018 0136   CL 114 (H) 12/22/2018 0513   CL 117 (H) 12/20/2018 0315   CL 117 (H) 12/17/2018 0136   CO2 20 (L) 12/22/2018 0513   CO2 14 (L) 12/20/2018 0315   CO2 16 (L) 12/17/2018 0136   GLUCOSE 97 12/22/2018 0513   GLUCOSE 90 12/20/2018 0315   GLUCOSE 158 (H) 12/17/2018 0136   BUN 8 12/22/2018 0513   BUN 14 12/20/2018 0315   BUN 23 12/17/2018 0136   CREATININE 1.18 (H) 12/22/2018 0513   CREATININE 1.20 (H) 12/20/2018 0315   CREATININE 1.83 (H) 12/17/2018 0136   CALCIUM 8.5 (L) 12/22/2018 0513   CALCIUM 8.3 (L) 12/20/2018 0315   CALCIUM 8.0 (L) 12/17/2018 0136   GFRNONAA 49 (L)  12/22/2018 0513   GFRNONAA 48 (L) 12/20/2018 0315   GFRNONAA 29 (L) 12/17/2018 0136   GFRAA 57 (L) 12/22/2018 0513   GFRAA 56 (L) 12/20/2018 0315   GFRAA 33 (L) 12/17/2018 0136   CBC    Component Value Date/Time   WBC 6.0 12/22/2018 0513   RBC 2.48 (L) 12/22/2018 0513   HGB 7.3 (L) 12/22/2018 0513   HCT 23.3 (L) 12/22/2018 0513   PLT 297 12/22/2018 0513   MCV 94.0 12/22/2018 0513   MCH 29.4 12/22/2018 0513   MCHC 31.3 12/22/2018 0513   RDW 16.6 (H) 12/22/2018 0513   LYMPHSABS 1.3 12/16/2018 1416   MONOABS 0.6 12/16/2018 1416   EOSABS 0.2 12/16/2018 1416   BASOSABS 0.1 12/16/2018 1416   HEPATIC Function Panel Recent Labs    05/18/18 1028 12/16/18 1416 12/22/18 0513  PROT 7.2 5.7* 4.6*   HEMOGLOBIN A1C No components found for: HGA1C,  MPG CARDIAC ENZYMES Lab Results  Component Value Date   CKTOTAL 735 (H) 02/01/2018   BNP No results for input(s): PROBNP in the last 8760 hours. TSH Recent Labs    05/18/18 1029  TSH 1.464   CHOLESTEROL No results for input(s): CHOL in the last 8760 hours.  Scheduled Meds: . amLODipine  5 mg Oral Daily  . insulin aspart  0-9 Units Subcutaneous  TID WC  . metoprolol tartrate  12.5 mg Oral BID  . pantoprazole (PROTONIX) IV  40 mg Intravenous Q24H  . polyethylene glycol-electrolytes  4,000 mL Oral Once  . potassium chloride  10 mEq Oral BID  . sodium chloride flush  3 mL Intravenous Q12H  . tiZANidine  2 mg Oral TID   Continuous Infusions: . sodium chloride    . sodium chloride 50 mL/hr at 12/21/18 1300   PRN Meds:.acetaminophen, ALPRAZolam, hydrALAZINE, ondansetron (ZOFRAN) IV  Assessment/Plan: Acute lower GI bleed, recurrent S/P Colonoscopy and Polypectomy S/P Mesenteric angiogram and tertiary branch of right Colic artery embolization with coil Acute blood loss anemia CAD CABG HTN Type 2 DM Hyperlipidemia Tobacco use disorder Depression CKD, III  Monitor H/H for GI bleed. Add Tizanidine for muscle spasm and  pain   LOS: 6 days    Dixie Dials  MD  12/22/2018, 2:47 PM

## 2018-12-22 NOTE — Progress Notes (Signed)
Subjective: Weak. No blood in stool. No abdominal pain.  Objective: Vital signs in last 24 hours: Temp:  [97.9 F (36.6 C)-98.6 F (37 C)] 98.6 F (37 C) (12/28 1347) Pulse Rate:  [67-77] 71 (12/28 1347) Resp:  [18-22] 18 (12/28 1347) BP: (124-143)/(58-70) 140/70 (12/28 1347) SpO2:  [99 %-100 %] 100 % (12/28 1347) Weight change:  Last BM Date: 12/21/18  PE: GEN:  Pale, NAD  Lab Results: CBC    Component Value Date/Time   WBC 6.0 12/22/2018 0513   RBC 2.48 (L) 12/22/2018 0513   HGB 7.3 (L) 12/22/2018 0513   HCT 23.3 (L) 12/22/2018 0513   PLT 297 12/22/2018 0513   MCV 94.0 12/22/2018 0513   MCH 29.4 12/22/2018 0513   MCHC 31.3 12/22/2018 0513   RDW 16.6 (H) 12/22/2018 0513   LYMPHSABS 1.3 12/16/2018 1416   MONOABS 0.6 12/16/2018 1416   EOSABS 0.2 12/16/2018 1416   BASOSABS 0.1 12/16/2018 1416   CMP     Component Value Date/Time   NA 142 12/22/2018 0513   K 3.7 12/22/2018 0513   CL 114 (H) 12/22/2018 0513   CO2 20 (L) 12/22/2018 0513   GLUCOSE 97 12/22/2018 0513   BUN 8 12/22/2018 0513   CREATININE 1.18 (H) 12/22/2018 0513   CALCIUM 8.5 (L) 12/22/2018 0513   PROT 4.6 (L) 12/22/2018 0513   ALBUMIN 2.5 (L) 12/22/2018 0513   AST 13 (L) 12/22/2018 0513   ALT 10 12/22/2018 0513   ALKPHOS 95 12/22/2018 0513   BILITOT 0.3 12/22/2018 0513   GFRNONAA 49 (L) 12/22/2018 0513   GFRAA 57 (L) 12/22/2018 0513   Assessment:  1. Post polypectomy bleeding. Has had embolization few days ago. Do not suspect active ongoing bleeding. 2. Acute blood loss anemia. Hgb stable. 3. Chronic anticoagulation. On hold.  Plan:  1.  Advance diet. 2.  Consider PT evaluation tomorrow. 3.  OOBTC/ambulate with assistance as tolerated. 4.  Anticoagulation on hold. 5.  Follow CBC. 6.  [facility] will follow.   Landry Dyke 12/22/2018, 4:20 PM   Cell [phone] If no answer or after 5 PM call [phone]

## 2018-12-23 LAB — GLUCOSE, CAPILLARY
Glucose-Capillary: 93 mg/dL (ref 70–99)
Glucose-Capillary: 118 mg/dL — ABNORMAL HIGH (ref 70–99)
Glucose-Capillary: 131 mg/dL — ABNORMAL HIGH (ref 70–99)
Glucose-Capillary: 127 mg/dL — ABNORMAL HIGH (ref 70–99)

## 2018-12-23 LAB — CBC WITH DIFFERENTIAL/PLATELET
Abs Immature Granulocytes: 0.05 10*3/uL (ref 0.00–0.07)
Basophils Absolute: 0.1 10*3/uL (ref 0.0–0.1)
Basophils Relative: 1 %
Eosinophils Absolute: 0.4 10*3/uL (ref 0.0–0.5)
Eosinophils Relative: 6 %
HCT: 23.9 % — ABNORMAL LOW (ref 36.0–46.0)
Hemoglobin: 7.4 g/dL — ABNORMAL LOW (ref 12.0–15.0)
Immature Granulocytes: 1 %
Lymphocytes Relative: 16 %
Lymphs Abs: 1.3 10*3/uL (ref 0.7–4.0)
MCH: 29.5 pg (ref 26.0–34.0)
MCHC: 31 g/dL (ref 30.0–36.0)
MCV: 95.2 fL (ref 80.0–100.0)
MONO ABS: 0.6 10*3/uL (ref 0.1–1.0)
Monocytes Relative: 7 %
Neutro Abs: 5.4 10*3/uL (ref 1.7–7.7)
Neutrophils Relative %: 69 %
Platelets: 325 10*3/uL (ref 150–400)
RBC: 2.51 MIL/uL — ABNORMAL LOW (ref 3.87–5.11)
RDW: 16.2 % — ABNORMAL HIGH (ref 11.5–15.5)
WBC: 7.8 10*3/uL (ref 4.0–10.5)
nRBC: 0 % (ref 0.0–0.2)

## 2018-12-23 NOTE — Progress Notes (Signed)
Pt continues to demand tylenol 3 and xanax, writer does not have order to give these meds at this time, patient has been educated, and pt continues to say that she has meds in her purse she will take.Writer asked pt again to send meds to pharmacy pt refuses to do so. Provider made aware. Will continue to monitor.

## 2018-12-23 NOTE — Progress Notes (Signed)
Patient requested xanax for anxiety, writer explained to pt that she can not have another xanax yet as it is ordered twice daily. Pt became angry and stated she wants to be comfortable and that she takes medication 3x daily at home. Writer told patient would be happy to request xanax 3x daily from the doctor but cannot dispense with out an order. Pt then said "can I have a tylenol 3 or aspirin or something, o forget it I have my own xanax in my purse" pt grabbed for her purse at this time.  Writer has educated pt of safety, about double dosing,  & that we send all home meds to pharmacy and dispense them as ordered. Pt refuses to let writer send any home meds to the pharmacy. Will continue to monitor.

## 2018-12-23 NOTE — Progress Notes (Signed)
Ref: Renee Dials, MD   Subjective:  Hgb 7.4 g. Afebrile. Weakness. She has not ambulated much.  Objective:  Vital Signs in the last 24 hours: Temp:  [98 F (36.7 C)-98.3 F (36.8 C)] 98.3 F (36.8 C) (12/29 1313) Pulse Rate:  [64-81] 64 (12/29 1313) Resp:  [14-18] 18 (12/29 1313) BP: (104-187)/(59-76) 104/59 (12/29 1313) SpO2:  [99 %-100 %] 99 % (12/29 1313)  Physical Exam: BP Readings from Last 1 Encounters:  12/23/18 (!) 104/59     Wt Readings from Last 1 Encounters:  12/16/18 72.6 kg    Weight change:  Body mass index is 28.34 kg/m. HEENT: [place]/AT, Eyes-Brown, PERL, EOMI, Conjunctiva-Pale, Sclera-Non-icteric Neck: No JVD, No bruit, Trachea midline. Lungs:  Clear, Bilateral. Cardiac:  Regular rhythm, normal S1 and S2, no S3. II/VI systolic murmur. Abdomen:  Soft, non-tender. BS present. Extremities:  No edema present. No cyanosis. No clubbing. CNS: AxOx3, Cranial nerves grossly intact, moves all 4 extremities.  Skin: Warm and dry.   Intake/Output from previous day: 12/28 0701 - 12/29 0700 In: 840 [P.O.:840] Out: 225 [Urine:225]    Lab Results: BMET    Component Value Date/Time   NA 142 12/22/2018 0513   NA 141 12/20/2018 0315   NA 141 12/17/2018 0136   K 3.7 12/22/2018 0513   K 3.3 (L) 12/20/2018 0315   K 3.9 12/17/2018 0136   CL 114 (H) 12/22/2018 0513   CL 117 (H) 12/20/2018 0315   CL 117 (H) 12/17/2018 0136   CO2 20 (L) 12/22/2018 0513   CO2 14 (L) 12/20/2018 0315   CO2 16 (L) 12/17/2018 0136   GLUCOSE 97 12/22/2018 0513   GLUCOSE 90 12/20/2018 0315   GLUCOSE 158 (H) 12/17/2018 0136   BUN 8 12/22/2018 0513   BUN 14 12/20/2018 0315   BUN 23 12/17/2018 0136   CREATININE 1.18 (H) 12/22/2018 0513   CREATININE 1.20 (H) 12/20/2018 0315   CREATININE 1.83 (H) 12/17/2018 0136   CALCIUM 8.5 (L) 12/22/2018 0513   CALCIUM 8.3 (L) 12/20/2018 0315   CALCIUM 8.0 (L) 12/17/2018 0136   GFRNONAA 49 (L) 12/22/2018 0513   GFRNONAA 48 (L) 12/20/2018 0315   GFRNONAA 29 (L) 12/17/2018 0136   GFRAA 57 (L) 12/22/2018 0513   GFRAA 56 (L) 12/20/2018 0315   GFRAA 33 (L) 12/17/2018 0136   CBC    Component Value Date/Time   WBC 7.8 12/23/2018 0925   RBC 2.51 (L) 12/23/2018 0925   HGB 7.4 (L) 12/23/2018 0925   HCT 23.9 (L) 12/23/2018 0925   PLT 325 12/23/2018 0925   MCV 95.2 12/23/2018 0925   MCH 29.5 12/23/2018 0925   MCHC 31.0 12/23/2018 0925   RDW 16.2 (H) 12/23/2018 0925   LYMPHSABS 1.3 12/23/2018 0925   MONOABS 0.6 12/23/2018 0925   EOSABS 0.4 12/23/2018 0925   BASOSABS 0.1 12/23/2018 0925   HEPATIC Function Panel Recent Labs    05/18/18 1028 12/16/18 1416 12/22/18 0513  PROT 7.2 5.7* 4.6*   HEMOGLOBIN A1C No components found for: HGA1C,  MPG CARDIAC ENZYMES Lab Results  Component Value Date   CKTOTAL 735 (H) 02/01/2018   BNP No results for input(s): PROBNP in the last 8760 hours. TSH Recent Labs    05/18/18 1029  TSH 1.464   CHOLESTEROL No results for input(s): CHOL in the last 8760 hours.  Scheduled Meds: . amLODipine  5 mg Oral Daily  . insulin aspart  0-9 Units Subcutaneous TID WC  . metoprolol tartrate  12.5 mg  Oral BID  . pantoprazole (PROTONIX) IV  40 mg Intravenous Q24H  . polyethylene glycol-electrolytes  4,000 mL Oral Once  . potassium chloride  10 mEq Oral BID  . sodium chloride flush  3 mL Intravenous Q12H  . tiZANidine  2 mg Oral TID   Continuous Infusions: . sodium chloride 50 mL/hr at 12/21/18 1300   PRN Meds:.acetaminophen, ALPRAZolam, hydrALAZINE, ondansetron (ZOFRAN) IV  Assessment/Plan: Acute lower GI bleed Post Colonoscopy and Polypectomy, recurrent GI bleed S/P Mesenteric angiogram S/P coil embolization of tertiary branch of right Colic artery Acute blood loss anemia CAD CABG HTN Type 2 DM Hyperlipidemia Tobacco use disorder CKD, III Depression  Continue medical treatment. Increase activity. PT consult.   LOS: 7 days    Renee Dials  MD  12/23/2018, 2:40 PM

## 2018-12-23 NOTE — Progress Notes (Signed)
No reported bleeding.  Hgb 7.4 about same as yesterday.  Would continue supportive therapy, patient probably would benefit from PT.  [facility] will revisit tomorrow and likely sign off at that time if no further issues.  Would hold anticoagulation for the next couple weeks if at all possible.

## 2018-12-24 LAB — GLUCOSE, CAPILLARY
Glucose-Capillary: 106 mg/dL — ABNORMAL HIGH (ref 70–99)
Glucose-Capillary: 144 mg/dL — ABNORMAL HIGH (ref 70–99)
Glucose-Capillary: 143 mg/dL — ABNORMAL HIGH (ref 70–99)
Glucose-Capillary: 140 mg/dL — ABNORMAL HIGH (ref 70–99)

## 2018-12-24 LAB — BASIC METABOLIC PANEL
Anion gap: 8 (ref 5–15)
BUN: 15 mg/dL (ref 8–23)
CO2: 19 mmol/L — AB (ref 22–32)
Calcium: 8.5 mg/dL — ABNORMAL LOW (ref 8.9–10.3)
Chloride: 112 mmol/L — ABNORMAL HIGH (ref 98–111)
Creatinine, Ser: 1.39 mg/dL — ABNORMAL HIGH (ref 0.44–1.00)
GFR calc Af Amer: 47 mL/min — ABNORMAL LOW (ref >60)
GFR calc non Af Amer: 40 mL/min — ABNORMAL LOW (ref >60)
Glucose, Bld: 105 mg/dL — ABNORMAL HIGH (ref 70–99)
POTASSIUM: 4 mmol/L (ref 3.5–5.1)
Sodium: 139 mmol/L (ref 135–145)

## 2018-12-24 LAB — CBC
HEMATOCRIT: 23.3 % — AB (ref 36.0–46.0)
HEMOGLOBIN: 7.1 g/dL — AB (ref 12.0–15.0)
MCH: 28.9 pg (ref 26.0–34.0)
MCHC: 30.5 g/dL (ref 30.0–36.0)
MCV: 94.7 fL (ref 80.0–100.0)
Platelets: 342 10*3/uL (ref 150–400)
RBC: 2.46 MIL/uL — ABNORMAL LOW (ref 3.87–5.11)
RDW: 16 % — ABNORMAL HIGH (ref 11.5–15.5)
WBC: 8.1 10*3/uL (ref 4.0–10.5)
nRBC: 0 % (ref 0.0–0.2)

## 2018-12-24 NOTE — Progress Notes (Signed)
Ref: Renee Dials, MD   Subjective:  Positive nausea. Denies significant abdominal pain. Right hip pain continues. Ambulated some.  Objective:  Vital Signs in the last 24 hours: Temp:  [98.1 F (36.7 C)-98.9 F (37.2 C)] 98.1 F (36.7 C) (12/30 1341) Pulse Rate:  [57-70] 57 (12/30 1341) Resp:  [18] 18 (12/30 0433) BP: (99-151)/(47-98) 99/47 (12/30 1341) SpO2:  [92 %-100 %] 92 % (12/30 1341)  Physical Exam: BP Readings from Last 1 Encounters:  12/24/18 (!) 99/47     Wt Readings from Last 1 Encounters:  12/16/18 72.6 kg    Weight change:  Body mass index is 28.34 kg/m. HEENT: [place]/AT, Eyes-Brown, PERL, EOMI, Conjunctiva-Pink, Sclera-Non-icteric Neck: No JVD, No bruit, Trachea midline. Lungs:  Clear, Bilateral. Cardiac:  Regular rhythm, normal S1 and S2, no S3. II/VI systolic murmur. Abdomen:  Soft, non-tender. BS present. Extremities:  No edema present. No cyanosis. No clubbing.  CNS: AxOx3, Cranial nerves grossly intact, moves all 4 extremities.  Skin: Warm and dry.   Intake/Output from previous day: 12/29 0701 - 12/30 0700 In: 1000 [P.O.:1000] Out: -     Lab Results: BMET    Component Value Date/Time   NA 139 12/24/2018 0435   NA 142 12/22/2018 0513   NA 141 12/20/2018 0315   K 4.0 12/24/2018 0435   K 3.7 12/22/2018 0513   K 3.3 (L) 12/20/2018 0315   CL 112 (H) 12/24/2018 0435   CL 114 (H) 12/22/2018 0513   CL 117 (H) 12/20/2018 0315   CO2 19 (L) 12/24/2018 0435   CO2 20 (L) 12/22/2018 0513   CO2 14 (L) 12/20/2018 0315   GLUCOSE 105 (H) 12/24/2018 0435   GLUCOSE 97 12/22/2018 0513   GLUCOSE 90 12/20/2018 0315   BUN 15 12/24/2018 0435   BUN 8 12/22/2018 0513   BUN 14 12/20/2018 0315   CREATININE 1.39 (H) 12/24/2018 0435   CREATININE 1.18 (H) 12/22/2018 0513   CREATININE 1.20 (H) 12/20/2018 0315   CALCIUM 8.5 (L) 12/24/2018 0435   CALCIUM 8.5 (L) 12/22/2018 0513   CALCIUM 8.3 (L) 12/20/2018 0315   GFRNONAA 40 (L) 12/24/2018 0435   GFRNONAA 49 (L)  12/22/2018 0513   GFRNONAA 48 (L) 12/20/2018 0315   GFRAA 47 (L) 12/24/2018 0435   GFRAA 57 (L) 12/22/2018 0513   GFRAA 56 (L) 12/20/2018 0315   CBC    Component Value Date/Time   WBC 8.1 12/24/2018 0435   RBC 2.46 (L) 12/24/2018 0435   HGB 7.1 (L) 12/24/2018 0435   HCT 23.3 (L) 12/24/2018 0435   PLT 342 12/24/2018 0435   MCV 94.7 12/24/2018 0435   MCH 28.9 12/24/2018 0435   MCHC 30.5 12/24/2018 0435   RDW 16.0 (H) 12/24/2018 0435   LYMPHSABS 1.3 12/23/2018 0925   MONOABS 0.6 12/23/2018 0925   EOSABS 0.4 12/23/2018 0925   BASOSABS 0.1 12/23/2018 0925   HEPATIC Function Panel Recent Labs    05/18/18 1028 12/16/18 1416 12/22/18 0513  PROT 7.2 5.7* 4.6*   HEMOGLOBIN A1C No components found for: HGA1C,  MPG CARDIAC ENZYMES Lab Results  Component Value Date   CKTOTAL 735 (H) 02/01/2018   BNP No results for input(s): PROBNP in the last 8760 hours. TSH Recent Labs    05/18/18 1029  TSH 1.464   CHOLESTEROL No results for input(s): CHOL in the last 8760 hours.  Scheduled Meds: . amLODipine  5 mg Oral Daily  . insulin aspart  0-9 Units Subcutaneous TID WC  . metoprolol  tartrate  12.5 mg Oral BID  . pantoprazole (PROTONIX) IV  40 mg Intravenous Q24H  . polyethylene glycol-electrolytes  4,000 mL Oral Once  . potassium chloride  10 mEq Oral BID  . sodium chloride flush  3 mL Intravenous Q12H  . tiZANidine  2 mg Oral TID   Continuous Infusions: . sodium chloride Stopped (12/23/18 2017)   PRN Meds:.acetaminophen, ALPRAZolam, hydrALAZINE, ondansetron (ZOFRAN) IV  Assessment/Plan: Acute lower GI bleed Recurrent GI bleed post colonoscopy and polypectomy  S/P Mesenteric angiogram S/P coil embolization of tertiary branch of right Colic artery Acute blood loss Anemia CAD CABG HTN Type 2 DM Hyperlipidemia Tobacco use disorder CKD, III Depression  CT abdomen/Pelvis for f/u ovarian and adrenal mass.   LOS: 8 days    Renee Dials  MD  12/24/2018, 5:52  PM

## 2018-12-24 NOTE — Care Management Important Message (Signed)
Important Message  Patient Details  Name: Renee Griffin MRN: [id number] Date of Birth: 17-Feb-1956   Medicare Important Message Given:  Yes    Kerin Salen 12/24/2018, 10:16 AMImportant Message  Patient Details  Name: Renee Griffin MRN: [id number] Date of Birth: 12-11-1956   Medicare Important Message Given:  Yes    Kerin Salen 12/24/2018, 10:16 AM

## 2018-12-24 NOTE — Progress Notes (Signed)
Subjective: The patient was seen and examined at bedside. Reports having a solid bowel movement today without any blood in it( she didn't look at it, I spoke with her nurse). She complains of severe nausea.  Objective: Vital signs in last 24 hours: Temp:  [98.3 F (36.8 C)-98.9 F (37.2 C)] 98.9 F (37.2 C) (12/30 0433) Pulse Rate:  [61-75] 70 (12/30 0433) Resp:  [18] 18 (12/30 0433) BP: (104-187)/(56-98) 151/98 (12/30 0433) SpO2:  [99 %-100 %] 100 % (12/30 0433) Weight change:  Last BM Date: 12/21/18  QB:HALP pallor GENERAL:appears depressed, anxious ABDOMEN:abdominal wall hernia, nontender, nondistended EXTREMITIES:no deformity  Lab Results: Results for orders placed or performed during the hospital encounter of 12/16/18 (from the past 48 hour(s))  Glucose, capillary     Status: Abnormal   Collection Time: 12/22/18 12:27 PM  Result Value Ref Range   Glucose-Capillary 101 (H) 70 - 99 mg/dL  Glucose, capillary     Status: Abnormal   Collection Time: 12/22/18  4:39 PM  Result Value Ref Range   Glucose-Capillary 119 (H) 70 - 99 mg/dL  Glucose, capillary     Status: Abnormal   Collection Time: 12/22/18  9:21 PM  Result Value Ref Range   Glucose-Capillary 131 (H) 70 - 99 mg/dL  Glucose, capillary     Status: None   Collection Time: 12/23/18  8:23 AM  Result Value Ref Range   Glucose-Capillary 93 70 - 99 mg/dL  CBC with Differential/Platelet     Status: Abnormal   Collection Time: 12/23/18  9:25 AM  Result Value Ref Range   WBC 7.8 4.0 - 10.5 K/uL   RBC 2.51 (L) 3.87 - 5.11 MIL/uL   Hemoglobin 7.4 (L) 12.0 - 15.0 g/dL   HCT 23.9 (L) 36.0 - 46.0 %   MCV 95.2 80.0 - 100.0 fL   MCH 29.5 26.0 - 34.0 pg   MCHC 31.0 30.0 - 36.0 g/dL   RDW 16.2 (H) 11.5 - 15.5 %   Platelets 325 150 - 400 K/uL   nRBC 0.0 0.0 - 0.2 %   Neutrophils Relative % 69 %   Neutro Abs 5.4 1.7 - 7.7 K/uL   Lymphocytes Relative 16 %   Lymphs Abs 1.3 0.7 - 4.0 K/uL   Monocytes Relative 7 %   Monocytes  Absolute 0.6 0.1 - 1.0 K/uL   Eosinophils Relative 6 %   Eosinophils Absolute 0.4 0.0 - 0.5 K/uL   Basophils Relative 1 %   Basophils Absolute 0.1 0.0 - 0.1 K/uL   Immature Granulocytes 1 %   Abs Immature Granulocytes 0.05 0.00 - 0.07 K/uL    Comment: Performed at [facility], [place] [address] [place], [place] [address]  Glucose, capillary     Status: Abnormal   Collection Time: 12/23/18  1:12 PM  Result Value Ref Range   Glucose-Capillary 118 (H) 70 - 99 mg/dL  Glucose, capillary     Status: Abnormal   Collection Time: 12/23/18  4:52 PM  Result Value Ref Range   Glucose-Capillary 131 (H) 70 - 99 mg/dL  Glucose, capillary     Status: Abnormal   Collection Time: 12/23/18  9:37 PM  Result Value Ref Range   Glucose-Capillary 127 (H) 70 - 99 mg/dL  CBC     Status: Abnormal   Collection Time: 12/24/18  4:35 AM  Result Value Ref Range   WBC 8.1 4.0 - 10.5 K/uL   RBC 2.46 (L) 3.87 - 5.11 MIL/uL   Hemoglobin 7.1 (  L) 12.0 - 15.0 g/dL   HCT 23.3 (L) 36.0 - 46.0 %   MCV 94.7 80.0 - 100.0 fL   MCH 28.9 26.0 - 34.0 pg   MCHC 30.5 30.0 - 36.0 g/dL   RDW 16.0 (H) 11.5 - 15.5 %   Platelets 342 150 - 400 K/uL   nRBC 0.0 0.0 - 0.2 %    Comment: Performed at [facility], [place] [address], [place], [place] [address]  Basic metabolic panel     Status: Abnormal   Collection Time: 12/24/18  4:35 AM  Result Value Ref Range   Sodium 139 135 - 145 mmol/L   Potassium 4.0 3.5 - 5.1 mmol/L   Chloride 112 (H) 98 - 111 mmol/L   CO2 19 (L) 22 - 32 mmol/L   Glucose, Bld 105 (H) 70 - 99 mg/dL   BUN 15 8 - 23 mg/dL   Creatinine, Ser 1.39 (H) 0.44 - 1.00 mg/dL   Calcium 8.5 (L) 8.9 - 10.3 mg/dL   GFR calc non Af Amer 40 (L) >60 mL/min   GFR calc Af Amer 47 (L) >60 mL/min   Anion gap 8 5 - 15    Comment: Performed at [facility], [place] [address], [place], [place] [address]    Studies/Results: No results found.  Medications: I have reviewed the  patient's current medications.  Assessment: Post polypectomy bleeding, treated with coil embolization by IR Hemoglobin stable for the last 3 days at 7.3/7.4/7.1 Needed a total of 6 units PRBC transfusion, last transfusion on 12/18/18 Tolerating regular diet  Plan: Discussed with patient's nurse to keep patient on IV Zofran for nausea as has already been ordered. Recommend physical therapy. Will sign off from GI standpoint, recommend holding antiplatelets at least for a week if approved by cardiology.   Ronnette Juniper 12/24/2018, 8:57 AM   Pager [phone] If no answer or after 5 PM call [phone]

## 2018-12-24 NOTE — Evaluation (Signed)
Physical Therapy Evaluation Patient Details Name: Renee Griffin MRN: [id number] DOB: [birth year] Today's Date: 12/24/2018   History of Present Illness  62 yo female admitted with acute LGIB, anemia. hx of CAD, CABG, DM, CKD, CVA, depression, chronic pain  Clinical Impression  On eval, pt was Min guard-Min assist for mobility. She walked ~75 feet with a RW. Pt c/o lightheadedness and weakness. Discussed d/c plan-pt is unsure if she will be able to manage at home alone. She is open to discuss rehab options with a CSW. Should pt return home, recommend HHPT and RW use for ambulation safety.     Follow Up Recommendations SNF(HHPT if pt decides to return home)    Equipment Recommendations  Rolling walker with 5" wheels    Recommendations for Other Services       Precautions / Restrictions Precautions Precautions: Fall Restrictions Weight Bearing Restrictions: No      Mobility  Bed Mobility Overal bed mobility: Modified Independent                Transfers Overall transfer level: Needs assistance Equipment used: Rolling walker (2 wheeled) Transfers: Sit to/from Stand Sit to Stand: Min guard         General transfer comment: x2. Pt stool 1st time then immediately fell back onto bed. On 2nd time, pt was able to remain standing. She c/o lightheadedness and weakness.   Ambulation/Gait Ambulation/Gait assistance: Min guard;Min assist Gait Distance (Feet): 75 Feet Assistive device: Rolling walker (2 wheeled) Gait Pattern/deviations: Step-through pattern;Decreased stride length     General Gait Details: Unsteady. Intermittent assist to stabilize. As distance progressed, stability improved a bit.   Stairs            Wheelchair Mobility    Modified Rankin (Stroke Patients Only)       Balance Overall balance assessment: Needs assistance         Standing balance support: Bilateral upper extremity supported Standing balance-Leahy Scale: Poor                                Pertinent Vitals/Pain Pain Assessment: No/denies pain    Home Living Family/patient expects to be discharged to:: Private residence Living Arrangements: Alone   Type of Home: Apartment Home Access: Stairs to enter     Home Layout: One level Home Equipment: Cane - quad      Prior Function Level of Independence: Independent with assistive device(s)               Hand Dominance        Extremity/Trunk Assessment   Upper Extremity Assessment Upper Extremity Assessment: Overall WFL for tasks assessed    Lower Extremity Assessment Lower Extremity Assessment: Generalized weakness    Cervical / Trunk Assessment Cervical / Trunk Assessment: Normal  Communication   Communication: No difficulties  Cognition Arousal/Alertness: Awake/alert Behavior During Therapy: WFL for tasks assessed/performed Overall Cognitive Status: Within Functional Limits for tasks assessed                                 General Comments: a bit grumpy-displeased with care      General Comments      Exercises     Assessment/Plan    PT Assessment Patient needs continued PT services  PT Problem List Decreased strength;Decreased balance;Decreased activity tolerance;Decreased mobility;Decreased knowledge of use of DME  PT Treatment Interventions DME instruction;Gait training;Functional mobility training;Therapeutic activities;Balance training;Patient/family education;Therapeutic exercise    PT Goals (Current goals can be found in the Care Plan section)  Acute Rehab PT Goals Patient Stated Goal: to get stronger PT Goal Formulation: With patient Time For Goal Achievement: 01/07/19 Potential to Achieve Goals: Good    Frequency Min 3X/week   Barriers to discharge        Co-evaluation               AM-PAC PT "6 Clicks" Mobility  Outcome Measure Help needed turning from your back to your side while in a flat bed without using  bedrails?: None Help needed moving from lying on your back to sitting on the side of a flat bed without using bedrails?: None Help needed moving to and from a bed to a chair (including a wheelchair)?: A Little Help needed standing up from a chair using your arms (e.g., wheelchair or bedside chair)?: A Little Help needed to walk in hospital room?: A Little Help needed climbing 3-5 steps with a railing? : A Little 6 Click Score: 20    End of Session Equipment Utilized During Treatment: Gait belt Activity Tolerance: Patient limited by fatigue Patient left: in bed;with call bell/phone within reach   PT Visit Diagnosis: Muscle weakness (generalized) (M62.81);Unsteadiness on feet (R26.81)    Time: 2549-8264 PT Time Calculation (min) (ACUTE ONLY): 22 min   Charges:   PT Evaluation $PT Eval Moderate Complexity: [place], PT Acute Rehabilitation Services Pager: [phone] Office: [phone]

## 2018-12-25 ENCOUNTER — Encounter (HOSPITAL_COMMUNITY): Payer: Self-pay

## 2018-12-25 ENCOUNTER — Inpatient Hospital Stay (HOSPITAL_COMMUNITY): Payer: Medicare Other

## 2018-12-25 LAB — GLUCOSE, CAPILLARY
Glucose-Capillary: 91 mg/dL (ref 70–99)
Glucose-Capillary: 113 mg/dL — ABNORMAL HIGH (ref 70–99)

## 2018-12-25 MED ORDER — METOPROLOL TARTRATE 25 MG PO TABS: 12.5000 mg | ORAL_TABLET | Freq: Two times a day (BID) | ORAL | 3 refills | Status: DC

## 2018-12-25 MED ORDER — POTASSIUM CHLORIDE CRYS ER 10 MEQ PO TBCR: 10.0000 meq | EXTENDED_RELEASE_TABLET | Freq: Every day | ORAL | 3 refills | Status: DC

## 2018-12-25 MED ORDER — TIZANIDINE HCL 2 MG PO TABS: 2.0000 mg | ORAL_TABLET | Freq: Every evening | ORAL | 0 refills | Status: DC | PRN

## 2018-12-25 NOTE — Care Management (Signed)
Home Health orders received. This CM spoke with pt about dc needs and pt politely declines home health services. She does request a RW. RN made aware and [facility] rep alerted of DME need. Marney Doctor RN,BSN [phone]

## 2018-12-25 NOTE — Evaluation (Signed)
Occupational Therapy Evaluation Patient Details Name: Renee Griffin MRN: [id number] DOB: 20-Jun-1956 Today's Date: 12/25/2018    History of Present Illness 62 yo female admitted with acute LGIB, anemia. hx of CAD, CABG, DM, CKD, CVA, depression, chronic pain   Clinical Impression   Pt was admitted for the above.  At baseline, she is mod I.  Pt was limited by back pain at time of OT evaluation. Recommend 3:1 commode for home and HHOT for IADLs; helping her to perform tasks minimizing back pain    Follow Up Recommendations  Home health OT    Equipment Recommendations  3 in 1 bedside commode    Recommendations for Other Services       Precautions / Restrictions Precautions Precautions: Fall Restrictions Weight Bearing Restrictions: No      Mobility Bed Mobility Overal bed mobility: Modified Independent             General bed mobility comments: oob  Transfers Overall transfer level: Needs assistance Equipment used: Rolling walker (2 wheeled) Transfers: Sit to/from Stand Sit to Stand: Supervision             Balance                                           ADL either performed or assessed with clinical judgement   ADL Overall ADL's : Needs assistance/impaired             Lower Body Bathing: Minimal assistance       Lower Body Dressing: Minimal assistance                 General ADL Comments: pt limited by back pain, which has been ongoing.  Issued reacher and long sponge for home.  Talked to her about sponge bathing initially.  Pt ambulated to bathroom but pain too great to try comfort height commode. She has a standard at home and has been using 3:1 here. Recommend that for home.  reinforced body mechanics/back precautions to minimize pain.  Pt overall set up/supervision to gather items     Vision         Perception     Praxis      Pertinent Vitals/Pain Pain Assessment: 0-10 Pain Score: 9  Faces Pain Scale:  Hurts little more Pain Location: back Pain Descriptors / Indicators: Aching Pain Intervention(s): Limited activity within patient's tolerance;Monitored during session;Repositioned;Patient requesting pain meds-RN notified     Hand Dominance     Extremity/Trunk Assessment Upper Extremity Assessment Upper Extremity Assessment: Overall WFL for tasks assessed           Communication Communication Communication: No difficulties   Cognition Arousal/Alertness: Awake/alert Behavior During Therapy: WFL for tasks assessed/performed Overall Cognitive Status: Within Functional Limits for tasks assessed                                     General Comments       Exercises     Shoulder Instructions      Home Living Family/patient expects to be discharged to:: Private residence Living Arrangements: Alone   Type of Home: Apartment             Bathroom Shower/Tub: Teacher, early years/pre: Standard     Home Equipment: Grab bars - tub/shower;Cane -  quad          Prior Functioning/Environment Level of Independence: Independent with assistive device(s)                 OT Problem List:        OT Treatment/Interventions:      OT Goals(Current goals can be found in the care plan section) Acute Rehab OT Goals Patient Stated Goal: to get stronger OT Goal Formulation: All assessment and education complete, DC therapy  OT Frequency:     Barriers to D/C:            Co-evaluation              AM-PAC OT "6 Clicks" Daily Activity     Outcome Measure Help from another person eating meals?: None Help from another person taking care of personal grooming?: A Little Help from another person toileting, which includes using toliet, bedpan, or urinal?: A Little Help from another person bathing (including washing, rinsing, drying)?: A Little Help from another person to put on and taking off regular upper body clothing?: A Little Help from  another person to put on and taking off regular lower body clothing?: A Little 6 Click Score: 19   End of Session Nurse Communication: Patient requests pain meds(needs 3;1 for d/c )  Activity Tolerance: Patient limited by pain Patient left: in bed;with call bell/phone within reach(EOB)  OT Visit Diagnosis: Muscle weakness (generalized) (M62.81)                Time: 7989-2119 OT Time Calculation (min): 12 min Charges:  OT General Charges $OT Visit: 1 Visit OT Evaluation $OT Eval Low Complexity: 1 Low  Lesle Chris, OTR/L Acute Rehabilitation Services [phone] [facility] pager [phone] office 12/25/2018  [place] 12/25/2018, 3:13 PM

## 2018-12-25 NOTE — Progress Notes (Signed)
Received call from nurse-patient needs 3n1-3n1 ordered.TC [facility] rep Santiago Glad aware of order & d/c-will deliver to rm prior d/c.

## 2018-12-25 NOTE — Discharge Summary (Signed)
Physician Discharge Summary  Patient ID: Renee Griffin MRN: [id number] DOB/AGE: 63-17-1957 63 y.o.  Admit date: 12/16/2018 Discharge date: 12/25/2018  Admission Diagnoses: Acute lower GI bleed s/p recent colonoscopy and polypectomy Acute blood loss anemia Cardiogenic shock from above Type 2 DM H/O HTN Hyperlipidemia CKD, stage III Tobacco use disorder Depression  Discharge Diagnoses:  Principle problem: Acute recurrent lower GI bleed Active Problems:   S/P colonoscopy and polypectomy   S/P mesenteric angiogram   S/P coil embolization of tertiary branch of right Colic artery   Acute blood loss anemia   Weakness due to above   CAD   CABG   HTN   Type 2 DM   Hyperlipidemia   Tobacco use disorder   CKD, III   Depression   Large left adrenal gland mass/adenoma   Cystic left adnexal mass   Renal cystic lesions   Right adrenal gland adenoma  Discharged Condition: stable  Hospital Course: 62 year old female with PMH of CAD, CABG/bioprosthetic AV placement, HTN, type 2 DM, hyperlipidemia, stroke and tobacco use disorder had large amount of bright red bleed per rectum on day of admission. Her Hgb was down to less than 7 g. She received 4 units of PRBC in 2 days and 2 nits later on. Colonoscopy on admission showed multiple blood clots. Patient had coil embolization of tertiary branch of right Colic artery. Patient's Hgb stabilized but she had weakness requiring PT here and at home. She understood not to use aspirin, NSAIDs or Plavix. She had CT scan of abdomen and Pelvis showing stable left adnexal mass and left adrenal adenoma. Urology and GYN services were contacted again. Patient was given discharge instructions with consultants name, phone and address. She will see me in 1 week.   Consults: cardiology, GI, urology and gynecology  Significant Diagnostic Studies: labs: Hgb 6.8 on admission and 8.1 on discharge. Elevated BUN/Cr at 25/1.8 improving to 15/1.39. Hgb A1C was  5.2.  Colonoscopy: Multiple clots.  EKG: Sinus rhythm with old ASWMI.  CT abdomen and pelvis: Large left adnexal and left adrenal mass, multiple renal cysts and smaller right adrenal adenoma.  Treatments: IV hydration, PRBC transfusions, procedures: Coil embolization and emergent colonoscopy.  Discharge Exam: Blood pressure 130/61, pulse (!) 57, temperature (!) 97.5 F (36.4 C), temperature source Oral, resp. rate 18, height '5\' 3"'  (1.6 m), weight 72.6 kg, SpO2 100 %. General appearance: alert, cooperative and appears stated age. Head: Normocephalic, atraumatic. Eyes: Brown eyes, pale conjunctiva, corneas clear. PERRL, EOM's intact.  Neck: No adenopathy, no carotid bruit, no JVD, supple, symmetrical, trachea midline and thyroid not enlarged. Resp: Clear to auscultation bilaterally. Cardio: Regular rate and rhythm, S1, S2 normal, II/VI systolic murmur, no click, rub or gallop. GI: Soft, non-tender; bowel sounds normal. Extremities: No edema, cyanosis or clubbing. Skin: Warm and dry.  Neurologic: Alert and oriented X 3, normal strength and tone. Normal coordination and slow gait with cane use.  Disposition: Discharge disposition: 01-Home or Self Care        Allergies as of 12/25/2018      Reactions   Amoxicillin-pot Clavulanate Diarrhea   Has patient had a PCN reaction causing immediate rash, facial/tongue/throat swelling, SOB or lightheadedness with hypotension: Unknown Has patient had a PCN reaction causing severe rash involving mucus membranes or skin necrosis:UnknoWN Has patient had a PCN reaction that required hospitalization: Unknown Has patient had a PCN reaction occurring within the last 10 years: Unknown If all of the above answers are "NO", then  may proceed with Cephalosporin use.   Bupropion Nausea Only   Nicotine Nausea Only   Used patches only.      Medication List    TAKE these medications   ACCU-CHEK AVIVA PLUS test strip Generic drug:  glucose blood See  admin instructions.   ACCU-CHEK AVIVA PLUS w/Device Kit See admin instructions.   ACCU-CHEK SOFTCLIX LANCETS lancets See admin instructions.   acetaminophen-codeine 300-30 MG tablet Commonly known as:  TYLENOL #3 Take 1-2 tablets by mouth daily as needed for moderate pain.   ALPRAZolam 0.5 MG tablet Commonly known as:  XANAX Take 0.25 mg by mouth 3 (three) times daily.   cholecalciferol 25 MCG (1000 UT) tablet Commonly known as:  VITAMIN D3 Take 1,000 Units by mouth daily.   diltiazem 90 MG tablet Commonly known as:  CARDIZEM Take 1 tablet (90 mg total) by mouth 2 (two) times daily.   furosemide 20 MG tablet Commonly known as:  LASIX Take 1 tablet (20 mg total) by mouth every Monday, Wednesday, and Friday. What changed:  when to take this   gemfibrozil 600 MG tablet Commonly known as:  LOPID Take 600 mg by mouth 2 (two) times daily.   loperamide 2 MG capsule Commonly known as:  IMODIUM Take 1 capsule (2 mg total) by mouth 4 (four) times daily as needed for diarrhea or loose stools.   metFORMIN 500 MG tablet Commonly known as:  GLUCOPHAGE Take 1 tablet (500 mg total) by mouth 2 (two) times daily.   metoprolol tartrate 25 MG tablet Commonly known as:  LOPRESSOR Take 0.5 tablets (12.5 mg total) by mouth 2 (two) times daily.   oxymetazoline 0.05 % nasal spray Commonly known as:  AFRIN Place 1 spray into both nostrils 2 (two) times daily.   pantoprazole 40 MG tablet Commonly known as:  PROTONIX Take 1 tablet (40 mg total) by mouth 2 (two) times daily. What changed:    when to take this  reasons to take this   potassium chloride 10 MEQ tablet Commonly known as:  K-DUR,KLOR-CON Take 1 tablet (10 mEq total) by mouth daily.   prazosin 1 MG capsule Commonly known as:  MINIPRESS Take 1 capsule (1 mg total) by mouth 2 (two) times daily.   tiZANidine 2 MG tablet Commonly known as:  ZANAFLEX Take 1 tablet (2 mg total) by mouth at bedtime as needed for muscle  spasms.   valACYclovir 500 MG tablet Commonly known as:  VALTREX Take 500 mg by mouth daily.      Follow-up Information    McKenzie, Candee Furbish, MD. Schedule an appointment as soon as possible for a visit in 2 week(s).   Specialty:  Urology Contact information: [place] [address] [phone]        Dixie Dials, MD. Schedule an appointment as soon as possible for a visit in 1 week(s).   Specialty:  Cardiology Contact information: [place] [address] [place] for [facility]. Schedule an appointment as soon as possible for a visit.   Specialty:  Obstetrics and Gynecology Why:  For Follow up of ovarian cyst Contact information: [place] [phone]          Signed: [name] 12/25/2018, 1:52 PM

## 2018-12-25 NOTE — Progress Notes (Signed)
Physical Therapy Treatment Patient Details Name: Renee Griffin MRN: [id number] DOB: Nov 07, 1956 Today's Date: 12/25/2018    History of Present Illness 62 yo female admitted with acute LGIB, anemia. hx of CAD, CABG, DM, CKD, CVA, depression, chronic pain    PT Comments    Pt agreeable to ambulate however distance limited due to fatigue and back pain.  Pt denies any leg pain, states all pain today is coming from her low back area.  Pt returned to bed end of session.  Pt encouraged to eat meals OOB in recliner during acute stay.   Follow Up Recommendations  SNF     Equipment Recommendations  Rolling walker with 5" wheels    Recommendations for Other Services       Precautions / Restrictions Precautions Precautions: Fall    Mobility  Bed Mobility Overal bed mobility: Modified Independent                Transfers Overall transfer level: Needs assistance Equipment used: Rolling walker (2 wheeled) Transfers: Sit to/from Stand Sit to Stand: Min guard         General transfer comment: min/guard for safety  Ambulation/Gait Ambulation/Gait assistance: Min guard;Min assist Gait Distance (Feet): 80 Feet Assistive device: Rolling walker (2 wheeled) Gait Pattern/deviations: Step-through pattern;Decreased stride length     General Gait Details: slightly unsteady, encouraged weight bearing on RW, pt denies leg pain however states low back pain limiting, distance to tolerance   Stairs             Wheelchair Mobility    Modified Rankin (Stroke Patients Only)       Balance                                            Cognition Arousal/Alertness: Awake/alert Behavior During Therapy: WFL for tasks assessed/performed Overall Cognitive Status: Within Functional Limits for tasks assessed                                        Exercises      General Comments        Pertinent Vitals/Pain Pain Assessment: Faces Faces  Pain Scale: Hurts little more Pain Location: back Pain Descriptors / Indicators: Discomfort;Sore Pain Intervention(s): Monitored during session(pt declined heat, states she had muscle relaxer already)    Home Living                      Prior Function            PT Goals (current goals can now be found in the care plan section) Progress towards PT goals: Progressing toward goals    Frequency           PT Plan Current plan remains appropriate    Co-evaluation              AM-PAC PT "6 Clicks" Mobility   Outcome Measure  Help needed turning from your back to your side while in a flat bed without using bedrails?: None Help needed moving from lying on your back to sitting on the side of a flat bed without using bedrails?: None Help needed moving to and from a bed to a chair (including a wheelchair)?: A Little Help needed standing up from a chair using  your arms (e.g., wheelchair or bedside chair)?: A Little Help needed to walk in hospital room?: A Little Help needed climbing 3-5 steps with a railing? : A Little 6 Click Score: 20    End of Session Equipment Utilized During Treatment: Gait belt Activity Tolerance: Patient limited by fatigue Patient left: in bed;with call bell/phone within reach   PT Visit Diagnosis: Muscle weakness (generalized) (M62.81);Unsteadiness on feet (R26.81)     Time: 8185-9093 PT Time Calculation (min) (ACUTE ONLY): 8 min  Charges:  $Gait Training: 8-22 mins                    Carmelia Bake, PT, DPT Acute Rehabilitation Services Office: [phone] Pager: [phone]  Trena Platt 12/25/2018, 12:20 PM

## 2019-02-11 ENCOUNTER — Encounter: Payer: Self-pay | Admitting: Obstetrics & Gynecology

## 2019-02-11 ENCOUNTER — Ambulatory Visit (INDEPENDENT_AMBULATORY_CARE_PROVIDER_SITE_OTHER): Payer: Medicare Other | Admitting: Obstetrics & Gynecology

## 2019-02-11 VITALS — BP 159/75 | HR 77 | Wt 160.6 lb

## 2019-02-11 DIAGNOSIS — N83202 Unspecified ovarian cyst, left side: Secondary | ICD-10-CM

## 2019-02-11 NOTE — Addendum Note (Signed)
Addended by: Emily Filbert on: 02/11/2019 03:48 PM   Modules accepted: Orders

## 2019-02-11 NOTE — Progress Notes (Signed)
   Subjective:    Patient ID: Renee Griffin, female    DOB: May 30, 1956, 63 y.o.   MRN: [id number]  HPI  63 yo lady here for consult due to a 7.5 cm slightly complex left ovarian cyst found on CT 12/19. This was unchanged from when it was noted on a CT 2/19. In 2/19 her CA-125 was 21.   Review of Systems     Objective:   Physical Exam  Breathing, conversing normally, she walks with a cane Well nourished, well hydrated White female, no apparent distress Abd- benign     Assessment & Plan:  Anxiety and stress- she declines to see Jamie Left ovarian cyst, stable- I suspect that this is benign, but I want her to get a consult with gyn onc I will get a CA-125 today FH of breast and ovarian cancer- rec BRCA testing (I will get an [facility] test over here from the [facility] office as she cannot afford the [facility] test)

## 2019-02-12 ENCOUNTER — Telehealth: Payer: Self-pay | Admitting: Obstetrics & Gynecology

## 2019-02-12 ENCOUNTER — Telehealth: Payer: Self-pay | Admitting: *Deleted

## 2019-02-12 LAB — CA 125: Cancer Antigen (CA) 125: 19.3 U/mL (ref 0.0–38.1)

## 2019-02-12 NOTE — Telephone Encounter (Signed)
Called Dr. Marijean Heath office at [facility], scheduled the patient to see Dr. Denman George on Monday

## 2019-02-12 NOTE — Telephone Encounter (Signed)
Attempted to call patient with her oncology appt. No answer, called both numbers on file and they both rang for several minutes and did not roll over to a voicemail. No voicemail was left because of this.

## 2019-02-12 NOTE — Progress Notes (Signed)
Patient ID: Renee Griffin, female   DOB: 06-27-1956, 63 y.o.   MRN: [id number]   Attempted to contact the Oncology office to get patient scheduled to fulfill the referral needs. No answer at the office, left my name and contact number for them to give me a call back. Will try to reach them again today.

## 2019-02-17 ENCOUNTER — Other Ambulatory Visit: Payer: Self-pay

## 2019-02-17 ENCOUNTER — Emergency Department (HOSPITAL_COMMUNITY)
Admission: EM | Admit: 2019-02-17 | Discharge: 2019-02-17 | Disposition: A | Discharge status: Home / Self Care | Payer: Medicare Other | Attending: Emergency Medicine | Admitting: Emergency Medicine

## 2019-02-17 ENCOUNTER — Encounter (HOSPITAL_COMMUNITY): Payer: Self-pay | Admitting: Emergency Medicine

## 2019-02-17 DIAGNOSIS — Z7984 Long term (current) use of oral hypoglycemic drugs: Secondary | ICD-10-CM | POA: Insufficient documentation

## 2019-02-17 DIAGNOSIS — E669 Obesity, unspecified: Secondary | ICD-10-CM | POA: Insufficient documentation

## 2019-02-17 DIAGNOSIS — R112 Nausea with vomiting, unspecified: Secondary | ICD-10-CM | POA: Diagnosis present

## 2019-02-17 DIAGNOSIS — Z79899 Other long term (current) drug therapy: Secondary | ICD-10-CM | POA: Diagnosis not present

## 2019-02-17 DIAGNOSIS — E119 Type 2 diabetes mellitus without complications: Secondary | ICD-10-CM | POA: Insufficient documentation

## 2019-02-17 DIAGNOSIS — R5381 Other malaise: Secondary | ICD-10-CM | POA: Diagnosis not present

## 2019-02-17 DIAGNOSIS — Z8673 Personal history of transient ischemic attack (TIA), and cerebral infarction without residual deficits: Secondary | ICD-10-CM | POA: Insufficient documentation

## 2019-02-17 DIAGNOSIS — I10 Essential (primary) hypertension: Secondary | ICD-10-CM | POA: Diagnosis not present

## 2019-02-17 DIAGNOSIS — F17210 Nicotine dependence, cigarettes, uncomplicated: Secondary | ICD-10-CM | POA: Insufficient documentation

## 2019-02-17 LAB — URINALYSIS, ROUTINE W REFLEX MICROSCOPIC
Bilirubin Urine: NEGATIVE
Glucose, UA: NEGATIVE mg/dL
Hgb urine dipstick: NEGATIVE
Ketones, ur: NEGATIVE mg/dL
NITRITE: NEGATIVE
Protein, ur: NEGATIVE mg/dL
Specific Gravity, Urine: 1.01 (ref 1.005–1.030)
pH: 5 (ref 5.0–8.0)

## 2019-02-17 LAB — COMPREHENSIVE METABOLIC PANEL
ALT: 10 U/L (ref 0–44)
AST: 16 U/L (ref 15–41)
Albumin: 3.8 g/dL (ref 3.5–5.0)
Alkaline Phosphatase: 185 U/L — ABNORMAL HIGH (ref 38–126)
Anion gap: 9 (ref 5–15)
BUN: 27 mg/dL — ABNORMAL HIGH (ref 8–23)
CALCIUM: 9.7 mg/dL (ref 8.9–10.3)
CO2: 20 mmol/L — ABNORMAL LOW (ref 22–32)
Chloride: 111 mmol/L (ref 98–111)
Creatinine, Ser: 1.82 mg/dL — ABNORMAL HIGH (ref 0.44–1.00)
GFR calc Af Amer: 34 mL/min — ABNORMAL LOW (ref >60)
GFR, EST NON AFRICAN AMERICAN: 29 mL/min — AB (ref >60)
Glucose, Bld: 103 mg/dL — ABNORMAL HIGH (ref 70–99)
Potassium: 4 mmol/L (ref 3.5–5.1)
Sodium: 140 mmol/L (ref 135–145)
Total Bilirubin: 0.2 mg/dL — ABNORMAL LOW (ref 0.3–1.2)
Total Protein: 7.2 g/dL (ref 6.5–8.1)

## 2019-02-17 LAB — CBC
HCT: 29.9 % — ABNORMAL LOW (ref 36.0–46.0)
Hemoglobin: 8.6 g/dL — ABNORMAL LOW (ref 12.0–15.0)
MCH: 26 pg (ref 26.0–34.0)
MCHC: 28.8 g/dL — ABNORMAL LOW (ref 30.0–36.0)
MCV: 90.3 fL (ref 80.0–100.0)
Platelets: 417 10*3/uL — ABNORMAL HIGH (ref 150–400)
RBC: 3.31 MIL/uL — ABNORMAL LOW (ref 3.87–5.11)
RDW: 16.4 % — ABNORMAL HIGH (ref 11.5–15.5)
WBC: 5.5 10*3/uL (ref 4.0–10.5)
nRBC: 0 % (ref 0.0–0.2)

## 2019-02-17 LAB — LIPASE, BLOOD: Lipase: 25 U/L (ref 11–51)

## 2019-02-17 MED ORDER — SODIUM CHLORIDE 0.9 % IV BOLUS
1000.0000 mL | Freq: Once | INTRAVENOUS | Status: AC
  Administered 2019-02-17: 1000 mL via INTRAVENOUS

## 2019-02-17 MED ORDER — SODIUM CHLORIDE 0.9% FLUSH: 3.0000 mL | Freq: Once | INTRAVENOUS | Status: DC

## 2019-02-17 MED ORDER — ACETAMINOPHEN 325 MG PO TABS
650.0000 mg | ORAL_TABLET | Freq: Once | ORAL | Status: AC
  Administered 2019-02-17: 650 mg via ORAL
  Filled 2019-02-17: qty 2

## 2019-02-17 NOTE — ED Notes (Signed)
Pt aware of UA needed unable to urinate at this time

## 2019-02-17 NOTE — ED Triage Notes (Signed)
Pt states she has not been able to consume solids x several days due to nausea and diarrhea.

## 2019-02-17 NOTE — ED Provider Notes (Signed)
[facility] DEPT Provider Note   CSN: [id number] Arrival date & time: 02/17/19  1340    History   Chief Complaint Chief Complaint  Patient presents with  . not eating  . Nausea  . Diarrhea    HPI Renee Griffin is a 63 y.o. female.     HPI   She presents for evaluation of malaise with nausea and vomiting.  She is distraught over her recent diagnosis of polyps and has been referred to a "cancer doctor."  She states she has an appointment tomorrow.  She denies blood in stool.  She denies fever, chills, cough, shortness of breath, chest pain.  She has ongoing chronic upper abdominal pain from a hernia that has been present for a long time.  She has no immediate plans for surgical revision of the hernia.  She is a cigarette smoker.  She came here by EMS.  She lives alone.  Patient was recently evaluated by her gynecologist, and found to have a left ovarian cyst.  She has a family history of ovarian cancer, so the gynecologist referred her to a gynecologic oncologist, to be evaluated.  The appointment is tomorrow.  There are no other known modifying factors.     Past Medical History:  Diagnosis Date  . Depression   . Diabetes mellitus without complication ([place])   . Heart disease, congenital   . High cholesterol   . Hypertension   . Panic attack   . Stroke [facility])     Patient Active Problem List   Diagnosis Date Noted  . Acute lower GI bleeding 12/16/2018  . Dehydration 04/13/2018  . Ovarian mass, left   . Abdominal pain 02/01/2018  . Essential hypertension 03/04/2016  . HLD (hyperlipidemia) 03/04/2016  . Type 2 diabetes mellitus with circulatory disorder ([place]) 03/04/2016  . Obesity 03/04/2016  . CVA (cerebral vascular accident) ([place]) 03/05/2015  . Bilateral low back pain without sciatica 03/05/2015  . Essential hypertension, benign 03/05/2015  . Tobacco use disorder 03/05/2015  . Hyperlipidemia 03/05/2015  . Hyperparathyroidism, primary  ([place]) 02/13/2013  . Generalized ischemic cerebrovascular disease 02/06/2013  . Hyperreflexia 02/06/2013  . Abnormality of gait 02/06/2013  . Disturbance of skin sensation 02/06/2013  . CVA (cerebral infarction) 06/03/2012  . HTN (hypertension) 06/03/2012  . Dyslipidemia 06/03/2012  . Anxiety 06/03/2012    Past Surgical History:  Procedure Laterality Date  . CARDIAC SURGERY  2011   CABG  . COLONOSCOPY WITH PROPOFOL N/A 02/08/2018   Procedure: COLONOSCOPY WITH PROPOFOL;  Surgeon: Otis Brace, MD;  Location: [facility] ENDOSCOPY;  Service: Gastroenterology;  Laterality: N/A;  . COLONOSCOPY WITH PROPOFOL N/A 12/16/2018   Procedure: COLONOSCOPY WITH PROPOFOL;  Surgeon: Ronald Lobo, MD;  Location: [facility] ENDOSCOPY;  Service: Endoscopy;  Laterality: N/A;  . ESOPHAGOGASTRODUODENOSCOPY (EGD) WITH PROPOFOL Left 02/05/2018   Procedure: ESOPHAGOGASTRODUODENOSCOPY (EGD) WITH PROPOFOL;  Surgeon: Otis Brace, MD;  Location: [facility] ENDOSCOPY;  Service: Gastroenterology;  Laterality: Left;  . IR ANGIOGRAM SELECTIVE EACH ADDITIONAL VESSEL  12/18/2018  . IR ANGIOGRAM SELECTIVE EACH ADDITIONAL VESSEL  12/18/2018  . IR ANGIOGRAM SELECTIVE EACH ADDITIONAL VESSEL  12/18/2018  . IR ANGIOGRAM SELECTIVE EACH ADDITIONAL VESSEL  12/18/2018  . IR ANGIOGRAM VISCERAL SELECTIVE  12/18/2018  . IR EMBO ART  VEN HEMORR LYMPH EXTRAV  INC GUIDE ROADMAPPING  12/18/2018  . IR US GUIDE VASC ACCESS RIGHT  12/18/2018  . WISDOM TOOTH EXTRACTION       OB History    Gravida  1  Para      Term      Preterm      AB  1   Living  0     SAB      TAB  1   Ectopic      Multiple      Live Births               Home Medications    Prior to Admission medications   Medication Sig Start Date End Date Taking? Authorizing Provider  ALPRAZolam Duanne Moron) 0.5 MG tablet Take 0.25 mg by mouth daily as needed for anxiety.  01/28/18  Yes [provider]  cholecalciferol (VITAMIN D3) 25 MCG (1000 UT) tablet Take  1,000 Units by mouth daily.   Yes [provider]  diltiazem (CARDIZEM) 90 MG tablet Take 1 tablet (90 mg total) by mouth 2 (two) times daily. 02/09/18  Yes Dixie Dials, MD  furosemide (LASIX) 20 MG tablet Take 1 tablet (20 mg total) by mouth every Monday, Wednesday, and Friday. Patient taking differently: Take 20 mg by mouth daily.  04/16/18  Yes Dixie Dials, MD  gemfibrozil (LOPID) 600 MG tablet Take 600 mg by mouth 2 (two) times daily. 02/19/18  Yes [provider]  loperamide (IMODIUM) 2 MG capsule Take 1 capsule (2 mg total) by mouth 4 (four) times daily as needed for diarrhea or loose stools. 05/18/18  Yes Kirichenko, Tatyana, PA-C  metFORMIN (GLUCOPHAGE) 500 MG tablet Take 1 tablet (500 mg total) by mouth 2 (two) times daily. 04/15/18  Yes Dixie Dials, MD  metoprolol tartrate (LOPRESSOR) 25 MG tablet Take 0.5 tablets (12.5 mg total) by mouth 2 (two) times daily. 12/25/18  Yes Dixie Dials, MD  oxymetazoline (AFRIN) 0.05 % nasal spray Place 1 spray into both nostrils 2 (two) times daily.   Yes [provider]  pantoprazole (PROTONIX) 40 MG tablet Take 1 tablet (40 mg total) by mouth 2 (two) times daily. Patient taking differently: Take 40 mg by mouth 2 (two) times daily as needed (indigestion).  02/09/18  Yes Dixie Dials, MD  potassium chloride (K-DUR,KLOR-CON) 10 MEQ tablet Take 1 tablet (10 mEq total) by mouth daily. 12/25/18  Yes Dixie Dials, MD  prazosin (MINIPRESS) 1 MG capsule Take 1 capsule (1 mg total) by mouth 2 (two) times daily. 02/09/18  Yes Dixie Dials, MD  sertraline (ZOLOFT) 100 MG tablet Take 100 mg by mouth at bedtime.    Yes [provider]  tiZANidine (ZANAFLEX) 2 MG tablet Take 1 tablet (2 mg total) by mouth at bedtime as needed for muscle spasms. 12/25/18  Yes Dixie Dials, MD  valACYclovir (VALTREX) 500 MG tablet Take 500 mg by mouth daily.   Yes [provider]  ACCU-CHEK AVIVA PLUS test strip See admin instructions.  01/29/16   [provider]  ACCU-CHEK SOFTCLIX LANCETS lancets See admin instructions. 01/30/16   [provider]  acetaminophen-codeine (TYLENOL #3) 300-30 MG per tablet Take 1-2 tablets by mouth daily as needed for moderate pain. Patient not taking: Reported on 12/16/2018 03/05/15   Rosalin Hawking, MD  Blood Glucose Monitoring Suppl (ACCU-CHEK AVIVA PLUS) w/Device KIT See admin instructions. 01/29/16   [provider]    Family History Family History  Problem Relation Age of Onset  . Cancer Mother        uterian OR cervical  . Cancer Father        lung  . Leukemia Brother   . Cancer Sister  breast    Social History Social History   Tobacco Use  . Smoking status: Current Every Day Smoker    Packs/day: 1.00    Years: 50.00    Pack years: 50.00    Types: Cigarettes  . Smokeless tobacco: Never Used  Substance Use Topics  . Alcohol use: No    Alcohol/week: 0.0 standard drinks  . Drug use: No     Allergies   Amoxicillin-pot clavulanate; Bupropion; and Nicotine   Review of Systems Review of Systems  All other systems reviewed and are negative.    Physical Exam Updated Vital Signs BP (!) 155/63   Pulse (!) 57   Temp 97.8 F (36.6 C) (Oral)   Resp 18   SpO2 99%   Physical Exam Vitals signs and nursing note reviewed.  Constitutional:      General: She is in acute distress (Tear[place]).     Appearance: She is well-developed. She is obese. She is ill-appearing. She is not toxic-appearing or diaphoretic.     Comments: Appears older than stated age  HENT:     Head: Normocephalic and atraumatic.     Right Ear: External ear normal.     Left Ear: External ear normal.     Nose: Nose normal. No congestion or rhinorrhea.     Mouth/Throat:     Mouth: Mucous membranes are dry.  Eyes:     Conjunctiva/sclera: Conjunctivae normal.     Pupils: Pupils are equal, round, and reactive to light.  Neck:     Musculoskeletal: Normal range of motion and neck  supple.     Trachea: Phonation normal.  Cardiovascular:     Rate and Rhythm: Normal rate and regular rhythm.     Heart sounds: Normal heart sounds.  Pulmonary:     Effort: Pulmonary effort is normal.     Breath sounds: Normal breath sounds.  Abdominal:     General: There is no distension.     Palpations: Abdomen is soft. There is no mass.     Tenderness: There is abdominal tenderness (Mild, upper).     Hernia: No hernia is present.     Comments: None reducible epigastric region hernia.  This area is mildly tender.  Musculoskeletal: Normal range of motion.        General: No swelling, tenderness, deformity or signs of injury.  Skin:    General: Skin is warm and dry.     Coloration: Skin is not jaundiced or pale.  Neurological:     Mental Status: She is alert and oriented to person, place, and time.     Cranial Nerves: No cranial nerve deficit.     Sensory: No sensory deficit.     Motor: No abnormal muscle tone.     Coordination: Coordination normal.  Psychiatric:        Behavior: Behavior normal.        Thought Content: Thought content normal.        Judgment: Judgment normal.     Comments: She appears depressed and crying during the interview.      ED Treatments / Results  Labs (all labs ordered are listed, but only abnormal results are displayed) Labs Reviewed  COMPREHENSIVE METABOLIC PANEL - Abnormal; Notable for the following components:      Result Value   CO2 20 (*)    Glucose, Bld 103 (*)    BUN 27 (*)    Creatinine, Ser 1.82 (*)    Alkaline Phosphatase 185 (*)  Total Bilirubin 0.2 (*)    GFR calc non Af Amer 29 (*)    GFR calc Af Amer 34 (*)    All other components within normal limits  CBC - Abnormal; Notable for the following components:   RBC 3.31 (*)    Hemoglobin 8.6 (*)    HCT 29.9 (*)    MCHC 28.8 (*)    RDW 16.4 (*)    Platelets 417 (*)    All other components within normal limits  URINALYSIS, ROUTINE W REFLEX MICROSCOPIC - Abnormal; Notable  for the following components:   Leukocytes,Ua TRACE (*)    Bacteria, UA RARE (*)    All other components within normal limits  LIPASE, BLOOD    EKG None  Radiology No results found.  Procedures Procedures (including critical care time)  Medications Ordered in ED Medications  sodium chloride flush (NS) 0.9 % injection 3 mL (has no administration in time range)  sodium chloride 0.9 % bolus 1,000 mL (0 mLs Intravenous Stopped 02/17/19 1802)  acetaminophen (TYLENOL) tablet 650 mg (650 mg Oral Given 02/17/19 1839)     Initial Impression / Assessment and Plan / ED Course  I have reviewed the triage vital signs and the nursing notes.  Pertinent labs & imaging results that were available during my care of the patient were reviewed by me and considered in my medical decision making (see chart for details).  Clinical Course as of Feb 17 1900  Sun Feb 17, 2019  1817 Normal except CO2 low, glucose high, BUN high, creatinine high, alk phos stays high, total bilirubin low, GFR low  Comprehensive metabolic panel(!) [EW]  7824 Normal  Lipase, blood [EW]  1818 Normal except hemoglobin low, MCHC low  CBC(!) [EW]  1818 Laboratory trending indicates mild renal insufficiency today as compared to prior 1 month ago, GFR decreased from 40-29.   [EW]  1857 Normal except few WBCs and rare bacteria on microscopic.  Urinalysis, Routine w reflex microscopic(!) [EW]  1858 Leukocytes,Ua(!): TRACE [EW]    Clinical Course User Index [EW] Daleen Bo, MD        Patient Vitals for the past 24 hrs:  BP Temp Temp src Pulse Resp SpO2  02/17/19 1646 (!) 155/63 - - (!) 57 18 99 %  02/17/19 1350 114/60 97.8 F (36.6 C) Oral 66 15 98 %    6:54 PM Reevaluation with update and discussion. After initial assessment and treatment, an updated evaluation reveals no change in clinical status.  Findings discussed with the patient and all questions answered. Daleen Bo   Medical Decision Making: Lucky Rathke,  with patient concern for possible cancer.  Notes and chart indicate that she is being referred for ovarian cyst with family history of ovarian cancer but no suspected ovarian cancer in this patient.  Her appointment is tomorrow with Dr. Denman George.  CRITICAL CARE-no Performed by: Daleen Bo   Nursing Notes Reviewed/ Care Coordinated Applicable Imaging Reviewed Interpretation of Laboratory Data incorporated into ED treatment  The patient appears reasonably screened and/or stabilized for discharge and I doubt any other medical condition or other [facility] requiring further screening, evaluation, or treatment in the ED at this time prior to discharge.  Plan: Home Medications-continue usual medication; Home Treatments-rest, push fluids; return here if the recommended treatment, does not improve the symptoms; Recommended follow up-PCP, PRN.  Oncology, tomorrow, and schedule.    Final Clinical Impressions(s) / ED Diagnoses   Final diagnoses:  [facility]    ED Discharge Orders  None       Daleen Bo, MD 02/17/19 Lurline Hare

## 2019-02-17 NOTE — ED Notes (Signed)
Pt given Coke to drink.  

## 2019-02-17 NOTE — Discharge Instructions (Addendum)
The testing today is normal.  Make sure you are drinking plenty of fluids, to prevent dehydration.  Follow-up with Dr. Denman George, tomorrow as scheduled.

## 2019-02-18 ENCOUNTER — Inpatient Hospital Stay: Payer: Medicare Other | Attending: Gynecologic Oncology | Admitting: Gynecologic Oncology

## 2019-02-18 ENCOUNTER — Encounter: Payer: Self-pay | Admitting: Gynecologic Oncology

## 2019-02-18 VITALS — BP 94/55 | HR 65 | Temp 97.9°F | Resp 20 | Ht 63.0 in | Wt 160.0 lb

## 2019-02-18 DIAGNOSIS — E119 Type 2 diabetes mellitus without complications: Secondary | ICD-10-CM | POA: Insufficient documentation

## 2019-02-18 DIAGNOSIS — F17210 Nicotine dependence, cigarettes, uncomplicated: Secondary | ICD-10-CM

## 2019-02-18 DIAGNOSIS — Z8673 Personal history of transient ischemic attack (TIA), and cerebral infarction without residual deficits: Secondary | ICD-10-CM | POA: Diagnosis not present

## 2019-02-18 DIAGNOSIS — E7800 Pure hypercholesterolemia, unspecified: Secondary | ICD-10-CM | POA: Diagnosis not present

## 2019-02-18 DIAGNOSIS — N9489 Other specified conditions associated with female genital organs and menstrual cycle: Secondary | ICD-10-CM

## 2019-02-18 DIAGNOSIS — I10 Essential (primary) hypertension: Secondary | ICD-10-CM | POA: Diagnosis not present

## 2019-02-18 DIAGNOSIS — D398 Neoplasm of uncertain behavior of other specified female genital organs: Secondary | ICD-10-CM | POA: Diagnosis present

## 2019-02-18 NOTE — Patient Instructions (Signed)
Dr Denman George does not believe that this ovarian cyst is cancerous. She feels that the risks of surgery are greater than the benefits for this.  Dr Denman George recommends that you see Dr Ulanda Edison in 1 year for a repeat ultrasound to monitor the cyst for growth.   Her office can be reached at [phone] with questions.

## 2019-02-18 NOTE — Progress Notes (Signed)
Consult Note: Gyn-Onc  Consult was requested by Dr. Ulanda Edison for the evaluation of Renee Griffin 63 y.o. female  CC:  Chief Complaint  Patient presents with  . Adnexal mass    Left    Assessment/Plan:  Renee Griffin  is a 63 y.o.  year old with a 8cm left ovarian mostly cystic mass that has been essentially stable in size for 1 year, in the setting of normal CA 125. Renee Griffin has significant medical comorbiditiies, including a history of multiple CVA's and open heart surgery for a valve replacement. The mass is asymptomatic.  I have concerns about subjecting her to general anesthesia and an abdominal procedure for a stable asymptomatic mass which is almost certainly not malignant.  I am recommending that she have a follow-up ultrasound of the pelvis with Dr Ulanda Edison in 1 year to monitor the cyst for growth. If it grows >50% (more than 4cm) in that time frame, I would reconsider operating.  She has a striking history for potentially carrying hereditary gene mutation predisposing her to malignancy.  I offered genetic testing to the patient, however she declines this that she would not intervene differently if she carried such a gene.  HPI: Ms Renee Griffin is a 63 year old P0 who is seen in consultation at the request of Dr Ulanda Edison for a left ovarian cyst.   The patient is a poor historian likely due to her history of multiple CVA's from a congental heart valve abnormality.   She had a CT scan of the abdomen and pelvis in February, 2019 following an unwitnessed fall. This identified a 10cm left adrenal cyst, an epigastric hernia and a 7.5cm mildly complex left ovarian cyst.   This was followed up with a CT abd/pelvis on 12/25/18 which showed a stable left adrenal mass measuring 10.5 cm.  The epigastric hernia remains present.  The left adnexal cyst remain persistent and measured 8.4 x 7.1 x 6.6 cm.  It previously measured 7.4 x 6.5 x 6.8 cm.  There were no worrisome solid elements and 1 thin  septation superiorly.  There was no pelvic adenopathy or inguinal adenopathy.  There was no carcinomatosis.  There was a small periumbilical abdominal wall hernia containing fat seen.  The patient was seen by Dr. Ulanda Edison who procured a transvaginal ultrasound from [facility] which was performed on Fabry 14 2020.  This revealed a uterus in normal size measuring 7.6 x 2 x 4 cm.  The endometrial stripe was normal at 2 mm.  Cervix was grossly normal.  The right adnexa contained a 2.3 cm simple cyst.  There was no color flow by Doppler.  The left adnexa contained a complex cyst measuring 8.7 x 5.5 x 6.4 cm containing multiple thick internal septations.  In February 2019 she had undergone Ca1 25 assessment which was normal at 21.7.  It was repeated on 30 17th 2020 and remained stable and normal at 19.3.  Renee Griffin has a medical history significant for congenital valve lesion that resulted in multiple CVAs.  After her valve lesion was identified she underwent open heart surgery in 2009 to replace this valve with a pig valve.  She has never had coronary artery disease diagnosed per patient.  She does have diabetes mellitus which is controlled on oral agents.  She has hypertension and hypercholesterolemia.  She lives alone.  She is limited in ambulation requires a cane to walk.  Her family history is significant for a father with a history of lung  cancer, mother with a history of a gynecologic malignancy she, that she thinks it was uterine.  And a brother with a history of leukemia.  Her sister carries a diagnosis of breast cancer.  She has a history of smoking 1 pack/day for 50 years.  Current Meds:  Outpatient Encounter Medications as of 02/18/2019  Medication Sig  . ACCU-CHEK AVIVA PLUS test strip See admin instructions.  Marland Kitchen ACCU-CHEK SOFTCLIX LANCETS lancets See admin instructions.  . ALPRAZolam (XANAX) 0.5 MG tablet Take 0.25 mg by mouth daily as needed for anxiety.   . Blood Glucose Monitoring Suppl (ACCU-CHEK  AVIVA PLUS) w/Device KIT See admin instructions.  . cholecalciferol (VITAMIN D3) 25 MCG (1000 UT) tablet Take 1,000 Units by mouth daily.  Marland Kitchen diltiazem (CARDIZEM) 90 MG tablet Take 1 tablet (90 mg total) by mouth 2 (two) times daily.  . furosemide (LASIX) 20 MG tablet Take 1 tablet (20 mg total) by mouth every Monday, Wednesday, and Friday. (Patient taking differently: Take 20 mg by mouth daily. )  . gemfibrozil (LOPID) 600 MG tablet Take 600 mg by mouth 2 (two) times daily.  Marland Kitchen loperamide (IMODIUM) 2 MG capsule Take 1 capsule (2 mg total) by mouth 4 (four) times daily as needed for diarrhea or loose stools.  . metFORMIN (GLUCOPHAGE) 500 MG tablet Take 1 tablet (500 mg total) by mouth 2 (two) times daily.  . metoprolol tartrate (LOPRESSOR) 25 MG tablet Take 0.5 tablets (12.5 mg total) by mouth 2 (two) times daily.  . Multiple Vitamin (MULTIVITAMIN) tablet Take 1 tablet by mouth daily.  Marland Kitchen oxymetazoline (AFRIN) 0.05 % nasal spray Place 1 spray into both nostrils 2 (two) times daily.  . pantoprazole (PROTONIX) 40 MG tablet Take 1 tablet (40 mg total) by mouth 2 (two) times daily. (Patient taking differently: Take 40 mg by mouth 2 (two) times daily as needed (indigestion). )  . potassium chloride (K-DUR,KLOR-CON) 10 MEQ tablet Take 1 tablet (10 mEq total) by mouth daily.  . prazosin (MINIPRESS) 1 MG capsule Take 1 capsule (1 mg total) by mouth 2 (two) times daily.  . sertraline (ZOLOFT) 100 MG tablet Take 100 mg by mouth at bedtime.   Marland Kitchen tiZANidine (ZANAFLEX) 2 MG tablet Take 1 tablet (2 mg total) by mouth at bedtime as needed for muscle spasms.  . valACYclovir (VALTREX) 500 MG tablet Take 500 mg by mouth daily.  Marland Kitchen acetaminophen-codeine (TYLENOL #3) 300-30 MG per tablet Take 1-2 tablets by mouth daily as needed for moderate pain. (Patient not taking: Reported on 12/16/2018)   No facility-administered encounter medications on file as of 02/18/2019.     Allergy:  Allergies  Allergen Reactions  .  Amoxicillin-Pot Clavulanate Diarrhea    Has patient had a PCN reaction causing immediate rash, facial/tongue/throat swelling, SOB or lightheadedness with hypotension: Unknown Has patient had a PCN reaction causing severe rash involving mucus membranes or skin necrosis:UnknoWN Has patient had a PCN reaction that required hospitalization: Unknown Has patient had a PCN reaction occurring within the last 10 years: Unknown If all of the above answers are "NO", then may proceed with Cephalosporin use.   . Bupropion Nausea Only  . Nicotine Nausea Only    Used patches only., Also causes patient to have a rash.    Social Hx:   Social History   Socioeconomic History  . Marital status: Widowed    Spouse name: Not on file  . Number of children: Not on file  . Years of education: Not on file  .  Highest education level: Not on file  Occupational History  . Not on file  Social Needs  . Financial resource strain: Not on file  . Food insecurity:    Worry: Never true    Inability: Never true  . Transportation needs:    Medical: No    Non-medical: No  Tobacco Use  . Smoking status: Current Every Day Smoker    Packs/day: 1.00    Years: 50.00    Pack years: 50.00    Types: Cigarettes  . Smokeless tobacco: Never Used  Substance and Sexual Activity  . Alcohol use: No    Alcohol/week: 0.0 standard drinks  . Drug use: No  . Sexual activity: Never  Lifestyle  . Physical activity:    Days per week: Not on file    Minutes per session: Not on file  . Stress: Not on file  Relationships  . Social connections:    Talks on phone: Not on file    Gets together: Not on file    Attends religious service: Not on file    Active member of club or organization: Not on file    Attends meetings of clubs or organizations: Not on file    Relationship status: Not on file  . Intimate partner violence:    Fear of current or ex partner: Not on file    Emotionally abused: Not on file    Physically abused:  Not on file    Forced sexual activity: Not on file  Other Topics Concern  . Not on file  Social History Narrative  . Not on file    Past Surgical Hx:  Past Surgical History:  Procedure Laterality Date  . CARDIAC SURGERY  2011   CABG  . COLONOSCOPY WITH PROPOFOL N/A 02/08/2018   Procedure: COLONOSCOPY WITH PROPOFOL;  Surgeon: Otis Brace, MD;  Location: [facility];  Service: Gastroenterology;  Laterality: N/A;  . COLONOSCOPY WITH PROPOFOL N/A 12/16/2018   Procedure: COLONOSCOPY WITH PROPOFOL;  Surgeon: Ronald Lobo, MD;  Location: [facility];  Service: Endoscopy;  Laterality: N/A;  . DILATION AND EVACUATION     Patient states she had an abortion 30 or 40 years ago  . ESOPHAGOGASTRODUODENOSCOPY (EGD) WITH PROPOFOL Left 02/05/2018   Procedure: ESOPHAGOGASTRODUODENOSCOPY (EGD) WITH PROPOFOL;  Surgeon: Otis Brace, MD;  Location: [facility];  Service: Gastroenterology;  Laterality: Left;  . IR ANGIOGRAM SELECTIVE EACH ADDITIONAL VESSEL  12/18/2018  . IR ANGIOGRAM SELECTIVE EACH ADDITIONAL VESSEL  12/18/2018  . IR ANGIOGRAM SELECTIVE EACH ADDITIONAL VESSEL  12/18/2018  . IR ANGIOGRAM SELECTIVE EACH ADDITIONAL VESSEL  12/18/2018  . IR ANGIOGRAM VISCERAL SELECTIVE  12/18/2018  . IR EMBO ART  VEN HEMORR LYMPH EXTRAV  INC GUIDE ROADMAPPING  12/18/2018  . IR US GUIDE VASC ACCESS RIGHT  12/18/2018  . WISDOM TOOTH EXTRACTION      Past Medical Hx:  Past Medical History:  Diagnosis Date  . Depression   . Diabetes mellitus without complication ([place])   . Heart disease, congenital   . High cholesterol   . Hypertension   . Panic attack   . Stroke [facility])     Past Gynecological History:  G0 No LMP recorded. Patient is postmenopausal.  Family Hx:  Family History  Problem Relation Age of Onset  . Cancer Mother        uterian OR cervical  . Cancer Father        lung  . Leukemia Brother   . Cancer Sister  breast    Review of Systems:  Constitutional  Feels  well,    ENT Normal appearing ears and nares bilaterally Skin/Breast  No rash, sores, jaundice, itching, dryness Cardiovascular  No chest pain, shortness of breath, or edema  Pulmonary  No cough or wheeze.  Gastro Intestinal  No nausea, vomitting, or diarrhoea. No bright red blood per rectum, no abdominal pain, change in bowel movement, or constipation.  Genito Urinary  No frequency, urgency, dysuria, no bleeding Musculo Skeletal  No myalgia, arthralgia, joint swelling or pain  Neurologic  No weakness, numbness, change in gait,  Psychology  No depression, anxiety, insomnia.   Vitals:  Blood pressure (!) 94/55, pulse 65, temperature 97.9 F (36.6 C), temperature source Oral, resp. rate 20, height '5\' 3"'  (1.6 m), weight 160 lb (72.6 kg), SpO2 99 %.  Physical Exam: WD in NAD Neck  Supple NROM, without any enlargements.  Lymph Node Survey No cervical supraclavicular or inguinal adenopathy Cardiovascular  Pulse normal rate, regularity and rhythm. S1 and S2 normal.  Lungs  Clear to auscultation bilateraly, without wheezes/crackles/rhonchi. Good air movement.  Skin  No rash/lesions/breakdown  Psychiatry  Alert and oriented to person, place, and time  Abdomen  Normoactive bowel sounds, abdomen soft, non-tender and obese with epigastric hernia.  Back No CVA tenderness Genito Urinary  Vulva/vagina: Normal external female genitalia.  No lesions. No discharge or bleeding.  Bladder/urethra:  No lesions or masses, well supported bladder  Vagina: normal, narrow  Cervix: Normal appearing, no lesions.  Uterus:  Small, mobile, no parametrial involvement or nodularity.  Adnexa: no palpable masses. Smooth fullness appreciated in left adnexa Rectal  deferred.  Extremities  No bilateral cyanosis, clubbing or edema.   Thereasa Solo, MD  02/18/2019, 1:26 PM

## 2019-02-24 ENCOUNTER — Emergency Department (HOSPITAL_COMMUNITY)
Admission: EM | Admit: 2019-02-24 | Discharge: 2019-02-24 | Disposition: A | Discharge status: Home / Self Care | Payer: Medicare Other | Attending: Emergency Medicine | Admitting: Emergency Medicine

## 2019-02-24 ENCOUNTER — Other Ambulatory Visit: Payer: Self-pay

## 2019-02-24 DIAGNOSIS — E860 Dehydration: Secondary | ICD-10-CM | POA: Insufficient documentation

## 2019-02-24 DIAGNOSIS — Z7984 Long term (current) use of oral hypoglycemic drugs: Secondary | ICD-10-CM | POA: Insufficient documentation

## 2019-02-24 DIAGNOSIS — R197 Diarrhea, unspecified: Secondary | ICD-10-CM | POA: Insufficient documentation

## 2019-02-24 DIAGNOSIS — F17210 Nicotine dependence, cigarettes, uncomplicated: Secondary | ICD-10-CM | POA: Diagnosis not present

## 2019-02-24 DIAGNOSIS — E119 Type 2 diabetes mellitus without complications: Secondary | ICD-10-CM | POA: Diagnosis not present

## 2019-02-24 DIAGNOSIS — I10 Essential (primary) hypertension: Secondary | ICD-10-CM | POA: Insufficient documentation

## 2019-02-24 DIAGNOSIS — R110 Nausea: Secondary | ICD-10-CM | POA: Diagnosis present

## 2019-02-24 DIAGNOSIS — Z79899 Other long term (current) drug therapy: Secondary | ICD-10-CM | POA: Insufficient documentation

## 2019-02-24 DIAGNOSIS — R1110 Vomiting, unspecified: Secondary | ICD-10-CM

## 2019-02-24 DIAGNOSIS — F419 Anxiety disorder, unspecified: Secondary | ICD-10-CM | POA: Insufficient documentation

## 2019-02-24 DIAGNOSIS — N390 Urinary tract infection, site not specified: Secondary | ICD-10-CM

## 2019-02-24 LAB — CBC
HCT: 30.7 % — ABNORMAL LOW (ref 36.0–46.0)
Hemoglobin: 9.3 g/dL — ABNORMAL LOW (ref 12.0–15.0)
MCH: 26 pg (ref 26.0–34.0)
MCHC: 30.3 g/dL (ref 30.0–36.0)
MCV: 85.8 fL (ref 80.0–100.0)
NRBC: 0 % (ref 0.0–0.2)
Platelets: 450 10*3/uL — ABNORMAL HIGH (ref 150–400)
RBC: 3.58 MIL/uL — ABNORMAL LOW (ref 3.87–5.11)
RDW: 15.9 % — ABNORMAL HIGH (ref 11.5–15.5)
WBC: 6.3 10*3/uL (ref 4.0–10.5)

## 2019-02-24 LAB — COMPREHENSIVE METABOLIC PANEL
ALT: 9 U/L (ref 0–44)
AST: 14 U/L — ABNORMAL LOW (ref 15–41)
Albumin: 3.9 g/dL (ref 3.5–5.0)
Alkaline Phosphatase: 179 U/L — ABNORMAL HIGH (ref 38–126)
Anion gap: 13 (ref 5–15)
BUN: 28 mg/dL — ABNORMAL HIGH (ref 8–23)
CO2: 17 mmol/L — AB (ref 22–32)
Calcium: 10 mg/dL (ref 8.9–10.3)
Chloride: 108 mmol/L (ref 98–111)
Creatinine, Ser: 1.9 mg/dL — ABNORMAL HIGH (ref 0.44–1.00)
GFR calc non Af Amer: 28 mL/min — ABNORMAL LOW (ref >60)
GFR, EST AFRICAN AMERICAN: 32 mL/min — AB (ref >60)
Glucose, Bld: 109 mg/dL — ABNORMAL HIGH (ref 70–99)
Potassium: 3.6 mmol/L (ref 3.5–5.1)
Sodium: 138 mmol/L (ref 135–145)
Total Bilirubin: 0.6 mg/dL (ref 0.3–1.2)
Total Protein: 7.1 g/dL (ref 6.5–8.1)

## 2019-02-24 LAB — URINALYSIS, ROUTINE W REFLEX MICROSCOPIC
Bacteria, UA: NONE SEEN
Bilirubin Urine: NEGATIVE
GLUCOSE, UA: NEGATIVE mg/dL
Hgb urine dipstick: NEGATIVE
Ketones, ur: NEGATIVE mg/dL
Nitrite: NEGATIVE
PROTEIN: 30 mg/dL — AB
Specific Gravity, Urine: 1.02 (ref 1.005–1.030)
pH: 5 (ref 5.0–8.0)

## 2019-02-24 LAB — LIPASE, BLOOD: Lipase: 30 U/L (ref 11–51)

## 2019-02-24 MED ORDER — SODIUM CHLORIDE 0.9 % IV BOLUS
1000.0000 mL | Freq: Once | INTRAVENOUS | Status: AC
  Administered 2019-02-24: 1000 mL via INTRAVENOUS

## 2019-02-24 MED ORDER — CEPHALEXIN 500 MG PO CAPS: 500.0000 mg | ORAL_CAPSULE | Freq: Two times a day (BID) | ORAL | 0 refills | Status: DC

## 2019-02-24 MED ORDER — LORAZEPAM 2 MG/ML IJ SOLN
0.5000 mg | Freq: Once | INTRAMUSCULAR | Status: AC
  Administered 2019-02-24: 0.5 mg via INTRAVENOUS
  Filled 2019-02-24: qty 1

## 2019-02-24 MED ORDER — ONDANSETRON HCL 4 MG/2ML IJ SOLN
4.0000 mg | Freq: Once | INTRAMUSCULAR | Status: AC
  Administered 2019-02-24: 4 mg via INTRAVENOUS
  Filled 2019-02-24: qty 2

## 2019-02-24 MED ORDER — ALPRAZOLAM 0.5 MG PO TABS: 0.2500 mg | ORAL_TABLET | Freq: Two times a day (BID) | ORAL | 0 refills | Status: DC | PRN

## 2019-02-24 MED ORDER — ONDANSETRON HCL 4 MG PO TABS: 4.0000 mg | ORAL_TABLET | Freq: Four times a day (QID) | ORAL | 0 refills | Status: DC | PRN

## 2019-02-24 MED ORDER — CEPHALEXIN 500 MG PO CAPS
500.0000 mg | ORAL_CAPSULE | Freq: Once | ORAL | Status: AC
  Administered 2019-02-24: 500 mg via ORAL
  Filled 2019-02-24: qty 1

## 2019-02-24 MED ORDER — LOPERAMIDE HCL 2 MG PO CAPS: 2.0000 mg | ORAL_CAPSULE | Freq: Four times a day (QID) | ORAL | 0 refills | Status: DC | PRN

## 2019-02-24 NOTE — ED Notes (Signed)
URINE CULTURE SENT WITH URINE SAMPLE.  

## 2019-02-24 NOTE — ED Provider Notes (Signed)
[facility] DEPT Provider Note   CSN: [id number] Arrival date & time: 02/24/19  1651    History   Chief Complaint Chief Complaint  Patient presents with  . Diarrhea  . Fatigue  . Headache    HPI Renee Griffin is a 63 y.o. female.     HPI Patient states she has been unable to eat or drink anything for the past several days.  States she is having persistent nausea but no vomiting.  She has had several weeks of loose stools.  Had 2 loose stools today.  Denies any blood or grossly melanotic stool.  No fever or chills.  Has mild chronic abdominal pain and ventral hernia.  Patient also states that she is run out of her loperamide and Xanax.  Patient is stating that she does not want to leave the hospital until she knows what is wrong with her. Past Medical History:  Diagnosis Date  . Depression   . Diabetes mellitus without complication ([place])   . Heart disease, congenital   . High cholesterol   . Hypertension   . Panic attack   . Stroke [facility])     Patient Active Problem List   Diagnosis Date Noted  . Acute lower GI bleeding 12/16/2018  . Dehydration 04/13/2018  . Ovarian mass, left   . Abdominal pain 02/01/2018  . Essential hypertension 03/04/2016  . HLD (hyperlipidemia) 03/04/2016  . Type 2 diabetes mellitus with circulatory disorder ([place]) 03/04/2016  . Obesity 03/04/2016  . CVA (cerebral vascular accident) ([place]) 03/05/2015  . Bilateral low back pain without sciatica 03/05/2015  . Essential hypertension, benign 03/05/2015  . Tobacco use disorder 03/05/2015  . Hyperlipidemia 03/05/2015  . Hyperparathyroidism, primary ([place]) 02/13/2013  . Generalized ischemic cerebrovascular disease 02/06/2013  . Hyperreflexia 02/06/2013  . Abnormality of gait 02/06/2013  . Disturbance of skin sensation 02/06/2013  . CVA (cerebral infarction) 06/03/2012  . HTN (hypertension) 06/03/2012  . Dyslipidemia 06/03/2012  . Anxiety 06/03/2012    Past Surgical  History:  Procedure Laterality Date  . CARDIAC SURGERY  2011   CABG  . COLONOSCOPY WITH PROPOFOL N/A 02/08/2018   Procedure: COLONOSCOPY WITH PROPOFOL;  Surgeon: Otis Brace, MD;  [facility];  Service: Gastroenterology;  Laterality: N/A;  . COLONOSCOPY WITH PROPOFOL N/A 12/16/2018   Procedure: COLONOSCOPY WITH PROPOFOL;  Surgeon: Ronald Lobo, MD;  Location[facility];  Service: Endoscopy;  Laterality: N/A;  . DILATION AND EVACUATION     Patient states she had an abortion 30 or 40 years ago  . ESOPHAGOGASTRODUODENOSCOPY (EGD) WITH PROPOFOL Left 02/05/2018   Procedure: ESOPHAGOGASTRODUODENOSCOPY (EGD) WITH PROPOFOL;  Surgeon: Otis Brace, MD;  [facility];  Service: Gastroenterology;  Laterality: Left;  . IR ANGIOGRAM SELECTIVE EACH ADDITIONAL VESSEL  12/18/2018  . IR ANGIOGRAM SELECTIVE EACH ADDITIONAL VESSEL  12/18/2018  . IR ANGIOGRAM SELECTIVE EACH ADDITIONAL VESSEL  12/18/2018  . IR ANGIOGRAM SELECTIVE EACH ADDITIONAL VESSEL  12/18/2018  . IR ANGIOGRAM VISCERAL SELECTIVE  12/18/2018  . IR EMBO ART  VEN HEMORR LYMPH EXTRAV  INC GUIDE ROADMAPPING  12/18/2018  . IR US GUIDE VASC ACCESS RIGHT  12/18/2018  . WISDOM TOOTH EXTRACTION       OB History    Gravida  1   Para      Term      Preterm      AB  1   Living  0     SAB      TAB  1  Ectopic      Multiple      Live Births               Home Medications    Prior to Admission medications   Medication Sig Start Date End Date Taking? Authorizing Provider  ACCU-CHEK AVIVA PLUS test strip See admin instructions. 01/29/16   [provider]  ACCU-CHEK SOFTCLIX LANCETS lancets See admin instructions. 01/30/16   [provider]  acetaminophen-codeine (TYLENOL #3) 300-30 MG per tablet Take 1-2 tablets by mouth daily as needed for moderate pain. Patient not taking: Reported on 12/16/2018 03/05/15   Rosalin Hawking, MD  ALPRAZolam Duanne Moron) 0.5 MG tablet Take 0.5 tablets  (0.25 mg total) by mouth 2 (two) times daily as needed for anxiety. 02/24/19   Julianne Rice, MD  Blood Glucose Monitoring Suppl (ACCU-CHEK AVIVA PLUS) w/Device KIT See admin instructions. 01/29/16   [provider]  cephALEXin (KEFLEX) 500 MG capsule Take 1 capsule (500 mg total) by mouth 2 (two) times daily. 02/25/19   Julianne Rice, MD  cholecalciferol (VITAMIN D3) 25 MCG (1000 UT) tablet Take 1,000 Units by mouth daily.    [provider]  diltiazem (CARDIZEM) 90 MG tablet Take 1 tablet (90 mg total) by mouth 2 (two) times daily. 02/09/18   Dixie Dials, MD  furosemide (LASIX) 20 MG tablet Take 1 tablet (20 mg total) by mouth every Monday, Wednesday, and Friday. Patient taking differently: Take 20 mg by mouth daily.  04/16/18   Dixie Dials, MD  gemfibrozil (LOPID) 600 MG tablet Take 600 mg by mouth 2 (two) times daily. 02/19/18   [provider]  loperamide (IMODIUM) 2 MG capsule Take 1 capsule (2 mg total) by mouth 4 (four) times daily as needed for diarrhea or loose stools. 02/24/19   Julianne Rice, MD  metFORMIN (GLUCOPHAGE) 500 MG tablet Take 1 tablet (500 mg total) by mouth 2 (two) times daily. 04/15/18   Dixie Dials, MD  metoprolol tartrate (LOPRESSOR) 25 MG tablet Take 0.5 tablets (12.5 mg total) by mouth 2 (two) times daily. 12/25/18   Dixie Dials, MD  Multiple Vitamin (MULTIVITAMIN) tablet Take 1 tablet by mouth daily.    [provider]  ondansetron (ZOFRAN) 4 MG tablet Take 1 tablet (4 mg total) by mouth every 6 (six) hours as needed for nausea or vomiting. 02/24/19   Julianne Rice, MD  oxymetazoline (AFRIN) 0.05 % nasal spray Place 1 spray into both nostrils 2 (two) times daily.    [provider]  pantoprazole (PROTONIX) 40 MG tablet Take 1 tablet (40 mg total) by mouth 2 (two) times daily. Patient taking differently: Take 40 mg by mouth 2 (two) times daily as needed (indigestion).  02/09/18   Dixie Dials, MD  potassium chloride  (K-DUR,KLOR-CON) 10 MEQ tablet Take 1 tablet (10 mEq total) by mouth daily. 12/25/18   Dixie Dials, MD  prazosin (MINIPRESS) 1 MG capsule Take 1 capsule (1 mg total) by mouth 2 (two) times daily. 02/09/18   Dixie Dials, MD  sertraline (ZOLOFT) 100 MG tablet Take 100 mg by mouth at bedtime.     [provider]  tiZANidine (ZANAFLEX) 2 MG tablet Take 1 tablet (2 mg total) by mouth at bedtime as needed for muscle spasms. 12/25/18   Dixie Dials, MD  valACYclovir (VALTREX) 500 MG tablet Take 500 mg by mouth daily.    [provider]    Family History Family History  Problem Relation Age of Onset  . Cancer Mother  uterian OR cervical  . Cancer Father        lung  . Leukemia Brother   . Cancer Sister        breast    Social History Social History   Tobacco Use  . Smoking status: Current Every Day Smoker    Packs/day: 1.00    Years: 50.00    Pack years: 50.00    Types: Cigarettes  . Smokeless tobacco: Never Used  Substance Use Topics  . Alcohol use: No    Alcohol/week: 0.0 standard drinks  . Drug use: No     Allergies   Amoxicillin-pot clavulanate; Bupropion; and Nicotine   Review of Systems Review of Systems  Constitutional: Positive for appetite change and fatigue. Negative for chills and fever.  HENT: Negative for sore throat and trouble swallowing.   Eyes: Negative for photophobia and visual disturbance.  Respiratory: Negative for cough, shortness of breath and wheezing.   Cardiovascular: Negative for chest pain and palpitations.  Gastrointestinal: Positive for abdominal distention, abdominal pain, diarrhea and nausea. Negative for blood in stool, constipation and vomiting.  Genitourinary: Negative for dysuria, flank pain and frequency.  Musculoskeletal: Negative for back pain, myalgias and neck pain.  Skin: Negative for rash and wound.  Neurological: Negative for dizziness, weakness, light-headedness, numbness and headaches.    Psychiatric/Behavioral: The patient is nervous/anxious.   All other systems reviewed and are negative.    Physical Exam Updated Vital Signs BP (!) 179/71   Pulse 72   Temp 97.8 F (36.6 C) (Oral)   Resp 15   SpO2 100%   Physical Exam Vitals signs and nursing note reviewed.  Constitutional:      General: She is not in acute distress.    Appearance: Normal appearance. She is well-developed. She is not ill-appearing.  HENT:     Head: Normocephalic and atraumatic.     Nose: Nose normal. No congestion.     Mouth/Throat:     Mouth: Mucous membranes are moist.     Pharynx: No oropharyngeal exudate or posterior oropharyngeal erythema.  Eyes:     Extraocular Movements: Extraocular movements intact.     Conjunctiva/sclera: Conjunctivae normal.     Pupils: Pupils are equal, round, and reactive to light.  Neck:     Musculoskeletal: Normal range of motion and neck supple. No neck rigidity or muscular tenderness.  Cardiovascular:     Rate and Rhythm: Normal rate and regular rhythm.     Heart sounds: No murmur. No friction rub. No gallop.   Pulmonary:     Effort: Pulmonary effort is normal. No respiratory distress.     Breath sounds: Normal breath sounds. No stridor. No wheezing, rhonchi or rales.  Chest:     Chest wall: No tenderness.  Abdominal:     General: Bowel sounds are normal.     Palpations: Abdomen is soft.     Tenderness: There is abdominal tenderness. There is no right CVA tenderness, left CVA tenderness, guarding or rebound.     Hernia: A hernia is present.     Comments: Upper abdominal midline ventral hernia with mild tenderness to palpation.  Musculoskeletal: Normal range of motion.        General: No swelling, tenderness, deformity or signs of injury.     Right lower leg: No edema.     Left lower leg: No edema.  Lymphadenopathy:     Cervical: No cervical adenopathy.  Skin:    General: Skin is warm and dry.  Findings: No erythema or rash.  Neurological:      General: No focal deficit present.     Mental Status: She is alert and oriented to person, place, and time.     Comments: 5/5 motor in all extremities.  Sensation intact.  Psychiatric:        Behavior: Behavior normal.     Comments: Anxious appearing      ED Treatments / Results  Labs (all labs ordered are listed, but only abnormal results are displayed) Labs Reviewed  COMPREHENSIVE METABOLIC PANEL - Abnormal; Notable for the following components:      Result Value   CO2 17 (*)    Glucose, Bld 109 (*)    BUN 28 (*)    Creatinine, Ser 1.90 (*)    AST 14 (*)    Alkaline Phosphatase 179 (*)    GFR calc non Af Amer 28 (*)    GFR calc Af Amer 32 (*)    All other components within normal limits  CBC - Abnormal; Notable for the following components:   RBC 3.58 (*)    Hemoglobin 9.3 (*)    HCT 30.7 (*)    RDW 15.9 (*)    Platelets 450 (*)    All other components within normal limits  URINALYSIS, ROUTINE W REFLEX MICROSCOPIC - Abnormal; Notable for the following components:   APPearance HAZY (*)    Protein, ur 30 (*)    Leukocytes,Ua MODERATE (*)    All other components within normal limits  URINE CULTURE  LIPASE, BLOOD    EKG None  Radiology No results found.  Procedures Procedures (including critical care time)  Medications Ordered in ED Medications  sodium chloride 0.9 % bolus 1,000 mL (1,000 mLs Intravenous New Bag/Given 02/24/19 1955)  ondansetron (ZOFRAN) injection 4 mg (4 mg Intravenous Given 02/24/19 1955)  LORazepam (ATIVAN) injection 0.5 mg (0.5 mg Intravenous Given 02/24/19 1955)  cephALEXin (KEFLEX) capsule 500 mg (500 mg Oral Given 02/24/19 2111)     Initial Impression / Assessment and Plan / ED Course  I have reviewed the triage vital signs and the nursing notes.  Pertinent labs & imaging results that were available during my care of the patient were reviewed by me and considered in my medical decision making (see chart for details).       Patient  states he is feeling much better after IV fluids and medication.  Question mild UTI.  Urine sent for culture.  Will start on Keflex.  Patient is also out of her Xanax and loperamide.  No evidence of acute withdrawal from benzodiazepine.  Anxiety may be contributing to her symptoms.  Will give prescription for small amount of Xanax until she can follow-up with her primary physician.  Return precautions given.   Final Clinical Impressions(s) / ED Diagnoses   Final diagnoses:  Dehydration  Vomiting and diarrhea  Acute lower UTI    ED Discharge Orders         Ordered    cephALEXin (KEFLEX) 500 MG capsule  2 times daily     02/24/19 2139    ALPRAZolam (XANAX) 0.5 MG tablet  2 times daily PRN     02/24/19 2139    ondansetron (ZOFRAN) 4 MG tablet  Every 6 hours PRN     02/24/19 2139    loperamide (IMODIUM) 2 MG capsule  4 times daily PRN     02/24/19 2139           Julianne Rice, MD  02/24/19 2140

## 2019-02-24 NOTE — ED Triage Notes (Signed)
Pt comes in by [facility] for diarrhea and nausea and fatigue and dizzy when changing positions for over week. Vitals: 126/70, 70HR, 97% on room air CBG 105

## 2019-02-27 LAB — URINE CULTURE

## 2020-01-19 ENCOUNTER — Emergency Department (HOSPITAL_COMMUNITY): Payer: Medicare Other

## 2020-01-19 ENCOUNTER — Inpatient Hospital Stay (HOSPITAL_COMMUNITY)
Admission: EM | Admit: 2020-01-19 | Discharge: 2020-01-22 | DRG: 291 | Disposition: A | Discharge status: Home Health | Payer: Medicare Other | Attending: Cardiovascular Disease | Admitting: Cardiovascular Disease

## 2020-01-19 ENCOUNTER — Encounter (HOSPITAL_COMMUNITY): Payer: Self-pay | Admitting: Emergency Medicine

## 2020-01-19 ENCOUNTER — Other Ambulatory Visit: Payer: Self-pay

## 2020-01-19 DIAGNOSIS — E785 Hyperlipidemia, unspecified: Secondary | ICD-10-CM | POA: Diagnosis present

## 2020-01-19 DIAGNOSIS — E1122 Type 2 diabetes mellitus with diabetic chronic kidney disease: Secondary | ICD-10-CM | POA: Diagnosis present

## 2020-01-19 DIAGNOSIS — E790 Hyperuricemia without signs of inflammatory arthritis and tophaceous disease: Secondary | ICD-10-CM | POA: Diagnosis present

## 2020-01-19 DIAGNOSIS — E669 Obesity, unspecified: Secondary | ICD-10-CM | POA: Diagnosis present

## 2020-01-19 DIAGNOSIS — Z20822 Contact with and (suspected) exposure to covid-19: Secondary | ICD-10-CM | POA: Diagnosis present

## 2020-01-19 DIAGNOSIS — F410 Panic disorder [episodic paroxysmal anxiety] without agoraphobia: Secondary | ICD-10-CM | POA: Diagnosis present

## 2020-01-19 DIAGNOSIS — I130 Hypertensive heart and chronic kidney disease with heart failure and stage 1 through stage 4 chronic kidney disease, or unspecified chronic kidney disease: Secondary | ICD-10-CM | POA: Diagnosis present

## 2020-01-19 DIAGNOSIS — F17210 Nicotine dependence, cigarettes, uncomplicated: Secondary | ICD-10-CM | POA: Diagnosis present

## 2020-01-19 DIAGNOSIS — I340 Nonrheumatic mitral (valve) insufficiency: Secondary | ICD-10-CM | POA: Diagnosis not present

## 2020-01-19 DIAGNOSIS — Z888 Allergy status to other drugs, medicaments and biological substances status: Secondary | ICD-10-CM | POA: Diagnosis not present

## 2020-01-19 DIAGNOSIS — Z951 Presence of aortocoronary bypass graft: Secondary | ICD-10-CM | POA: Diagnosis not present

## 2020-01-19 DIAGNOSIS — Z881 Allergy status to other antibiotic agents status: Secondary | ICD-10-CM | POA: Diagnosis not present

## 2020-01-19 DIAGNOSIS — I5021 Acute systolic (congestive) heart failure: Secondary | ICD-10-CM | POA: Diagnosis present

## 2020-01-19 DIAGNOSIS — E7800 Pure hypercholesterolemia, unspecified: Secondary | ICD-10-CM | POA: Diagnosis present

## 2020-01-19 DIAGNOSIS — Z8673 Personal history of transient ischemic attack (TIA), and cerebral infarction without residual deficits: Secondary | ICD-10-CM | POA: Diagnosis not present

## 2020-01-19 DIAGNOSIS — I5031 Acute diastolic (congestive) heart failure: Secondary | ICD-10-CM | POA: Diagnosis present

## 2020-01-19 DIAGNOSIS — R778 Other specified abnormalities of plasma proteins: Secondary | ICD-10-CM

## 2020-01-19 DIAGNOSIS — I4891 Unspecified atrial fibrillation: Secondary | ICD-10-CM

## 2020-01-19 DIAGNOSIS — Z6829 Body mass index (BMI) 29.0-29.9, adult: Secondary | ICD-10-CM

## 2020-01-19 DIAGNOSIS — Z7984 Long term (current) use of oral hypoglycemic drugs: Secondary | ICD-10-CM

## 2020-01-19 DIAGNOSIS — Z953 Presence of xenogenic heart valve: Secondary | ICD-10-CM | POA: Diagnosis not present

## 2020-01-19 DIAGNOSIS — Z806 Family history of leukemia: Secondary | ICD-10-CM | POA: Diagnosis not present

## 2020-01-19 DIAGNOSIS — R7989 Other specified abnormal findings of blood chemistry: Secondary | ICD-10-CM

## 2020-01-19 DIAGNOSIS — I509 Heart failure, unspecified: Secondary | ICD-10-CM

## 2020-01-19 DIAGNOSIS — I351 Nonrheumatic aortic (valve) insufficiency: Secondary | ICD-10-CM | POA: Diagnosis not present

## 2020-01-19 DIAGNOSIS — N183 Chronic kidney disease, stage 3 unspecified: Secondary | ICD-10-CM | POA: Diagnosis present

## 2020-01-19 LAB — COMPREHENSIVE METABOLIC PANEL
ALT: 11 U/L (ref 0–44)
ALT: 12 U/L (ref 0–44)
AST: 19 U/L (ref 15–41)
AST: 21 U/L (ref 15–41)
Albumin: 3.5 g/dL (ref 3.5–5.0)
Albumin: 3.5 g/dL (ref 3.5–5.0)
Alkaline Phosphatase: 192 U/L — ABNORMAL HIGH (ref 38–126)
Alkaline Phosphatase: 195 U/L — ABNORMAL HIGH (ref 38–126)
Anion gap: 14 (ref 5–15)
Anion gap: 15 (ref 5–15)
BUN: 26 mg/dL — ABNORMAL HIGH (ref 8–23)
BUN: 28 mg/dL — ABNORMAL HIGH (ref 8–23)
CO2: 18 mmol/L — ABNORMAL LOW (ref 22–32)
CO2: 17 mmol/L — ABNORMAL LOW (ref 22–32)
Calcium: 9.4 mg/dL (ref 8.9–10.3)
Calcium: 9.2 mg/dL (ref 8.9–10.3)
Chloride: 110 mmol/L (ref 98–111)
Chloride: 110 mmol/L (ref 98–111)
Creatinine, Ser: 2.08 mg/dL — ABNORMAL HIGH (ref 0.44–1.00)
Creatinine, Ser: 2.14 mg/dL — ABNORMAL HIGH (ref 0.44–1.00)
GFR calc Af Amer: 28 mL/min — ABNORMAL LOW (ref >60)
GFR calc Af Amer: 27 mL/min — ABNORMAL LOW (ref >60)
GFR calc non Af Amer: 25 mL/min — ABNORMAL LOW (ref >60)
GFR calc non Af Amer: 24 mL/min — ABNORMAL LOW (ref >60)
Glucose, Bld: 98 mg/dL (ref 70–99)
Glucose, Bld: 111 mg/dL — ABNORMAL HIGH (ref 70–99)
Potassium: 4.2 mmol/L (ref 3.5–5.1)
Potassium: 4.1 mmol/L (ref 3.5–5.1)
Sodium: 142 mmol/L (ref 135–145)
Sodium: 142 mmol/L (ref 135–145)
Total Bilirubin: 1.2 mg/dL (ref 0.3–1.2)
Total Bilirubin: 0.9 mg/dL (ref 0.3–1.2)
Total Protein: 6.5 g/dL (ref 6.5–8.1)
Total Protein: 6.7 g/dL (ref 6.5–8.1)

## 2020-01-19 LAB — CBC WITH DIFFERENTIAL/PLATELET
Abs Immature Granulocytes: 0.07 10*3/uL (ref 0.00–0.07)
Basophils Absolute: 0.1 10*3/uL (ref 0.0–0.1)
Basophils Relative: 1 %
Eosinophils Absolute: 0.1 10*3/uL (ref 0.0–0.5)
Eosinophils Relative: 1 %
HCT: 33.9 % — ABNORMAL LOW (ref 36.0–46.0)
Hemoglobin: 10.6 g/dL — ABNORMAL LOW (ref 12.0–15.0)
Immature Granulocytes: 1 %
Lymphocytes Relative: 11 %
Lymphs Abs: 1 10*3/uL (ref 0.7–4.0)
MCH: 30 pg (ref 26.0–34.0)
MCHC: 31.3 g/dL (ref 30.0–36.0)
MCV: 96 fL (ref 80.0–100.0)
Monocytes Absolute: 0.6 10*3/uL (ref 0.1–1.0)
Monocytes Relative: 7 %
Neutro Abs: 6.9 10*3/uL (ref 1.7–7.7)
Neutrophils Relative %: 79 %
Platelets: 398 10*3/uL (ref 150–400)
RBC: 3.53 MIL/uL — ABNORMAL LOW (ref 3.87–5.11)
RDW: 17.6 % — ABNORMAL HIGH (ref 11.5–15.5)
WBC: 8.7 10*3/uL (ref 4.0–10.5)
nRBC: 0.8 % — ABNORMAL HIGH (ref 0.0–0.2)

## 2020-01-19 LAB — BRAIN NATRIURETIC PEPTIDE: B Natriuretic Peptide: 2779.1 pg/mL — ABNORMAL HIGH (ref 0.0–100.0)

## 2020-01-19 LAB — POC SARS CORONAVIRUS 2 AG -  ED: SARS Coronavirus 2 Ag: NEGATIVE

## 2020-01-19 LAB — TROPONIN I (HIGH SENSITIVITY)
Troponin I (High Sensitivity): 534 ng/L (ref <18)
Troponin I (High Sensitivity): 656 ng/L (ref <18)

## 2020-01-19 LAB — HIV ANTIBODY (ROUTINE TESTING W REFLEX): HIV Screen 4th Generation wRfx: NONREACTIVE

## 2020-01-19 LAB — GLUCOSE, CAPILLARY: Glucose-Capillary: 126 mg/dL — ABNORMAL HIGH (ref 70–99)

## 2020-01-19 LAB — TSH: TSH: 2.243 u[IU]/mL (ref 0.350–4.500)

## 2020-01-19 LAB — MAGNESIUM: Magnesium: 1.9 mg/dL (ref 1.7–2.4)

## 2020-01-19 LAB — SARS CORONAVIRUS 2 (TAT 6-24 HRS): SARS Coronavirus 2: NEGATIVE

## 2020-01-19 MED ORDER — DIGOXIN 0.25 MG/ML IJ SOLN
0.2500 mg | Freq: Once | INTRAMUSCULAR | Status: AC
  Administered 2020-01-19: 15:00:00 0.25 mg via INTRAVENOUS
  Filled 2020-01-19: qty 2

## 2020-01-19 MED ORDER — SODIUM CHLORIDE 0.9 % IV SOLN: 250.0000 mL | INTRAVENOUS | Status: DC | PRN

## 2020-01-19 MED ORDER — SODIUM CHLORIDE 0.9 % IV BOLUS
500.0000 mL | Freq: Once | INTRAVENOUS | Status: AC
  Administered 2020-01-19: 500 mL via INTRAVENOUS

## 2020-01-19 MED ORDER — PRAZOSIN HCL 1 MG PO CAPS
1.0000 mg | ORAL_CAPSULE | Freq: Two times a day (BID) | ORAL | Status: DC
  Administered 2020-01-19 – 2020-01-22 (×6): 1 mg via ORAL
  Filled 2020-01-19 (×7): qty 1

## 2020-01-19 MED ORDER — METOPROLOL TARTRATE 25 MG PO TABS
12.5000 mg | ORAL_TABLET | Freq: Two times a day (BID) | ORAL | Status: DC
  Administered 2020-01-19 – 2020-01-22 (×6): 12.5 mg via ORAL
  Filled 2020-01-19 (×6): qty 1

## 2020-01-19 MED ORDER — TIZANIDINE HCL 4 MG PO TABS
2.0000 mg | ORAL_TABLET | Freq: Every evening | ORAL | Status: DC | PRN
  Filled 2020-01-19: qty 1

## 2020-01-19 MED ORDER — VITAMIN D 25 MCG (1000 UNIT) PO TABS
1000.0000 [IU] | ORAL_TABLET | Freq: Every day | ORAL | Status: DC
  Administered 2020-01-19 – 2020-01-22 (×4): 1000 [IU] via ORAL
  Filled 2020-01-19 (×4): qty 1

## 2020-01-19 MED ORDER — HEPARIN (PORCINE) 25000 UT/250ML-% IV SOLN
1400.0000 [IU]/h | INTRAVENOUS | Status: DC
  Administered 2020-01-19: 15:00:00 1000 [IU]/h via INTRAVENOUS
  Administered 2020-01-20: 10:00:00 1300 [IU]/h via INTRAVENOUS
  Filled 2020-01-19 (×3): qty 250

## 2020-01-19 MED ORDER — LOPERAMIDE HCL 2 MG PO CAPS: 2.0000 mg | ORAL_CAPSULE | Freq: Four times a day (QID) | ORAL | Status: DC | PRN

## 2020-01-19 MED ORDER — ALPRAZOLAM 0.25 MG PO TABS
0.2500 mg | ORAL_TABLET | Freq: Two times a day (BID) | ORAL | Status: DC | PRN
  Administered 2020-01-19 – 2020-01-22 (×6): 0.25 mg via ORAL
  Filled 2020-01-19 (×6): qty 1

## 2020-01-19 MED ORDER — FUROSEMIDE 10 MG/ML IJ SOLN
80.0000 mg | Freq: Two times a day (BID) | INTRAMUSCULAR | Status: DC
  Administered 2020-01-19 – 2020-01-20 (×3): 80 mg via INTRAVENOUS
  Filled 2020-01-19 (×3): qty 8

## 2020-01-19 MED ORDER — ONDANSETRON HCL 4 MG PO TABS: 4.0000 mg | ORAL_TABLET | Freq: Four times a day (QID) | ORAL | Status: DC | PRN

## 2020-01-19 MED ORDER — OXYMETAZOLINE HCL 0.05 % NA SOLN
1.0000 | Freq: Two times a day (BID) | NASAL | Status: DC
  Administered 2020-01-19 – 2020-01-21 (×2): 1 via NASAL
  Filled 2020-01-19: qty 30

## 2020-01-19 MED ORDER — HEPARIN BOLUS VIA INFUSION
4000.0000 [IU] | Freq: Once | INTRAVENOUS | Status: AC
  Administered 2020-01-19: 4000 [IU] via INTRAVENOUS
  Filled 2020-01-19: qty 4000

## 2020-01-19 MED ORDER — SODIUM CHLORIDE 0.9% FLUSH
3.0000 mL | Freq: Two times a day (BID) | INTRAVENOUS | Status: DC
  Administered 2020-01-19 – 2020-01-22 (×7): 3 mL via INTRAVENOUS

## 2020-01-19 MED ORDER — DILTIAZEM LOAD VIA INFUSION
15.0000 mg | Freq: Once | INTRAVENOUS | Status: AC
  Administered 2020-01-19: 15 mg via INTRAVENOUS
  Filled 2020-01-19: qty 15

## 2020-01-19 MED ORDER — LEVALBUTEROL HCL 1.25 MG/0.5ML IN NEBU
INHALATION_SOLUTION | RESPIRATORY_TRACT | Status: AC
  Filled 2020-01-19: qty 0.5

## 2020-01-19 MED ORDER — ADULT MULTIVITAMIN W/MINERALS CH
1.0000 | ORAL_TABLET | Freq: Every day | ORAL | Status: DC
  Administered 2020-01-19 – 2020-01-22 (×4): 1 via ORAL
  Filled 2020-01-19 (×4): qty 1

## 2020-01-19 MED ORDER — SODIUM CHLORIDE 0.9% FLUSH: 3.0000 mL | INTRAVENOUS | Status: DC | PRN

## 2020-01-19 MED ORDER — LEVALBUTEROL HCL 1.25 MG/0.5ML IN NEBU
1.2500 mg | INHALATION_SOLUTION | Freq: Three times a day (TID) | RESPIRATORY_TRACT | Status: DC
  Administered 2020-01-19 – 2020-01-22 (×6): 1.25 mg via RESPIRATORY_TRACT
  Filled 2020-01-19 (×9): qty 0.5

## 2020-01-19 MED ORDER — DILTIAZEM HCL-DEXTROSE 125-5 MG/125ML-% IV SOLN (PREMIX)
5.0000 mg/h | INTRAVENOUS | Status: DC
  Administered 2020-01-19: 5 mg/h via INTRAVENOUS
  Administered 2020-01-20: 04:00:00 10 mg/h via INTRAVENOUS
  Filled 2020-01-19 (×3): qty 125

## 2020-01-19 MED ORDER — LORAZEPAM 2 MG/ML IJ SOLN
INTRAMUSCULAR | Status: AC
  Administered 2020-01-19: 15:00:00 1 mg
  Filled 2020-01-19: qty 1

## 2020-01-19 MED ORDER — ACETAMINOPHEN 325 MG PO TABS
650.0000 mg | ORAL_TABLET | ORAL | Status: DC | PRN
  Administered 2020-01-19 – 2020-01-22 (×7): 650 mg via ORAL
  Filled 2020-01-19 (×7): qty 2

## 2020-01-19 MED ORDER — SERTRALINE HCL 100 MG PO TABS
100.0000 mg | ORAL_TABLET | Freq: Every day | ORAL | Status: DC
  Administered 2020-01-19 – 2020-01-21 (×3): 100 mg via ORAL
  Filled 2020-01-19 (×3): qty 1

## 2020-01-19 MED ORDER — ONDANSETRON HCL 4 MG/2ML IJ SOLN
4.0000 mg | Freq: Four times a day (QID) | INTRAMUSCULAR | Status: DC | PRN
  Administered 2020-01-21: 12:00:00 4 mg via INTRAVENOUS
  Filled 2020-01-19: qty 2

## 2020-01-19 MED ORDER — VALACYCLOVIR HCL 500 MG PO TABS
500.0000 mg | ORAL_TABLET | Freq: Every day | ORAL | Status: DC
  Administered 2020-01-19 – 2020-01-22 (×4): 500 mg via ORAL
  Filled 2020-01-19 (×4): qty 1

## 2020-01-19 NOTE — ED Notes (Signed)
Ask patient for urine sample, Patient stated that she did not need to urinate at the time.

## 2020-01-19 NOTE — H&P (Signed)
Referring Physician: Dr. Barbaraann Boys LAUREE YURICK is an 64 y.o. female.                       Chief Complaint: Shortness of breath  HPI: 64 years old white female with PMH of hypertension, type 2 DM, CKD, III, stroke, hyperlipidemia, Tobacco use disorder, panic attack and obesity has exertional dyspnea without fever, chills, chest pain or cough. She had cardiac surgery in 2011 with bioprosthetic AV and CABG. She also has atrial fibrillation with RVR. She was started on IV heparin, IV lanoxin and IV diltiazem with drip. She had panic attack in ER requiring 1 mg. IV lorazepam.  Her BNP is elevated at 2779.1 pg/mL. Her chest x-ray shows cardiomegaly and vascular congestion. EKG shows A,fib with RVR. She continues to smoke 1 pack cigarettes per day.  Past Medical History:  Diagnosis Date  . Depression   . Diabetes mellitus without complication ([place])   . Heart disease, congenital   . High cholesterol   . Hypertension   . Panic attack   . Stroke [facility])       Past Surgical History:  Procedure Laterality Date  . CARDIAC SURGERY  2011   CABG  . COLONOSCOPY WITH PROPOFOL N/A 02/08/2018   Procedure: COLONOSCOPY WITH PROPOFOL;  Surgeon: Otis Brace, MD;  Location: [facility];  Service: Gastroenterology;  Laterality: N/A;  . COLONOSCOPY WITH PROPOFOL N/A 12/16/2018   Procedure: COLONOSCOPY WITH PROPOFOL;  Surgeon: Ronald Lobo, MD;  Location: [facility];  Service: Endoscopy;  Laterality: N/A;  . DILATION AND EVACUATION     Patient states she had an abortion 30 or 40 years ago  . ESOPHAGOGASTRODUODENOSCOPY (EGD) WITH PROPOFOL Left 02/05/2018   Procedure: ESOPHAGOGASTRODUODENOSCOPY (EGD) WITH PROPOFOL;  Surgeon: Otis Brace, MD;  Location: [facility];  Service: Gastroenterology;  Laterality: Left;  . IR ANGIOGRAM SELECTIVE EACH ADDITIONAL VESSEL  12/18/2018  . IR ANGIOGRAM SELECTIVE EACH ADDITIONAL VESSEL  12/18/2018  . IR ANGIOGRAM SELECTIVE EACH ADDITIONAL VESSEL  12/18/2018  .  IR ANGIOGRAM SELECTIVE EACH ADDITIONAL VESSEL  12/18/2018  . IR ANGIOGRAM VISCERAL SELECTIVE  12/18/2018  . IR EMBO ART  VEN HEMORR LYMPH EXTRAV  INC GUIDE ROADMAPPING  12/18/2018  . IR US GUIDE VASC ACCESS RIGHT  12/18/2018  . WISDOM TOOTH EXTRACTION      Family History  Problem Relation Age of Onset  . Cancer Mother        uterian OR cervical  . Cancer Father        lung  . Leukemia Brother   . Cancer Sister        breast   Social History:  reports that she has been smoking cigarettes. She has a 50.00 pack-year smoking history. She has never used smokeless tobacco. She reports that she does not drink alcohol or use drugs.  Allergies:  Allergies  Allergen Reactions  . Amoxicillin-Pot Clavulanate Diarrhea    Has patient had a PCN reaction causing immediate rash, facial/tongue/throat swelling, SOB or lightheadedness with hypotension: Unknown Has patient had a PCN reaction causing severe rash involving mucus membranes or skin necrosis:UnknoWN Has patient had a PCN reaction that required hospitalization: Unknown Has patient had a PCN reaction occurring within the last 10 years: Unknown If all of the above answers are "NO", then may proceed with Cephalosporin use.   . Bupropion Nausea Only  . Nicotine Nausea Only    Used patches only., Also causes patient to have a rash.    (  Not in a hospital admission)   Results for orders placed or performed during the hospital encounter of 01/19/20 (from the past 48 hour(s))  CBC with Differential     Status: Abnormal   Collection Time: 01/19/20 12:50 PM  Result Value Ref Range   WBC 8.7 4.0 - 10.5 K/uL   RBC 3.53 (L) 3.87 - 5.11 MIL/uL   Hemoglobin 10.6 (L) 12.0 - 15.0 g/dL   HCT 33.9 (L) 36.0 - 46.0 %   MCV 96.0 80.0 - 100.0 fL   MCH 30.0 26.0 - 34.0 pg   MCHC 31.3 30.0 - 36.0 g/dL   RDW 17.6 (H) 11.5 - 15.5 %   Platelets 398 150 - 400 K/uL   nRBC 0.8 (H) 0.0 - 0.2 %   Neutrophils Relative % 79 %   Neutro Abs 6.9 1.7 - 7.7 K/uL    Lymphocytes Relative 11 %   Lymphs Abs 1.0 0.7 - 4.0 K/uL   Monocytes Relative 7 %   Monocytes Absolute 0.6 0.1 - 1.0 K/uL   Eosinophils Relative 1 %   Eosinophils Absolute 0.1 0.0 - 0.5 K/uL   Basophils Relative 1 %   Basophils Absolute 0.1 0.0 - 0.1 K/uL   Immature Granulocytes 1 %   Abs Immature Granulocytes 0.07 0.00 - 0.07 K/uL    Comment: Performed at [facility], [address] [place], [place] [address]  Comprehensive metabolic panel     Status: Abnormal   Collection Time: 01/19/20 12:50 PM  Result Value Ref Range   Sodium 142 135 - 145 mmol/L   Potassium 4.2 3.5 - 5.1 mmol/L   Chloride 110 98 - 111 mmol/L   CO2 18 (L) 22 - 32 mmol/L   Glucose, Bld 98 70 - 99 mg/dL   BUN 26 (H) 8 - 23 mg/dL   Creatinine, Ser 2.08 (H) 0.44 - 1.00 mg/dL   Calcium 9.4 8.9 - 10.3 mg/dL   Total Protein 6.5 6.5 - 8.1 g/dL   Albumin 3.5 3.5 - 5.0 g/dL   AST 19 15 - 41 U/L   ALT 11 0 - 44 U/L   Alkaline Phosphatase 192 (H) 38 - 126 U/L   Total Bilirubin 1.2 0.3 - 1.2 mg/dL   GFR calc non Af Amer 25 (L) >60 mL/min   GFR calc Af Amer 28 (L) >60 mL/min   Anion gap 14 5 - 15    Comment: Performed at [facility], [place] [address] [place], [place] [address]  Troponin I (High Sensitivity)     Status: Abnormal   Collection Time: 01/19/20 12:50 PM  Result Value Ref Range   Troponin I (High Sensitivity) 534 (HH) <18 ng/L    Comment: CRITICAL RESULT CALLED TO, READ BACK BY AND VERIFIED WITH: B.[email or web address] RN @ 0017 01/19/2020 BY C.EDENS (NOTE) Elevated high sensitivity troponin I (hsTnI) values and significant  changes across serial measurements may suggest ACS but many other  chronic and acute conditions are known to elevate hsTnI results.  Refer to the Links section for chest pain algorithms and additional  guidance. Performed at [facility], [place] [address], [place], [place] [address]   Magnesium     Status: None   Collection Time: 01/19/20 12:50 PM  Result Value Ref Range    Magnesium 1.9 1.7 - 2.4 mg/dL    Comment: Performed at [facility], [place] [address], [place], [place] [address]  TSH     Status: None  Collection Time: 01/19/20 12:50 PM  Result Value Ref Range   TSH 2.243 0.350 - 4.500 uIU/mL    Comment: Performed by a 3rd Generation assay with a functional sensitivity of <=0.01 uIU/mL. Performed at [facility], [place] [address], [place], [place] [address]   Brain natriuretic peptide     Status: Abnormal   Collection Time: 01/19/20 12:50 PM  Result Value Ref Range   B Natriuretic Peptide 2,779.1 (H) 0.0 - 100.0 pg/mL    Comment: Performed at [facility] [address] [place], [place] [address]  POC SARS Coronavirus 2 Ag-ED - Nasal Swab (BD Veritor Kit)     Status: None   Collection Time: 01/19/20  1:24 PM  Result Value Ref Range   SARS Coronavirus 2 Ag NEGATIVE NEGATIVE    Comment: (NOTE) SARS-CoV-2 antigen NOT DETECTED.  Negative results are presumptive.  Negative results do not preclude SARS-CoV-2 infection and should not be used as the sole basis for treatment or other patient management decisions, including infection  control decisions, particularly in the presence of clinical signs and  symptoms consistent with COVID-19, or in those who have been in contact with the virus.  Negative results must be combined with clinical observations, patient history, and epidemiological information. The expected result is Negative. Fact Sheet for Patients: PodPark.tn Fact Sheet for Healthcare Providers: GiftContent.is This test is not yet approved or cleared by the [place] FDA and  has been authorized for detection and/or diagnosis of SARS-CoV-2 by FDA under an Emergency Use Authorization (EUA).  This EUA will remain in effect (meaning this test can be used) for the duration of  the COVID-19 de claration under Section 564(b)(1) of the Act, 21 U.S.C. section 360bbb-3(b)(1),  unless the authorization is terminated or revoked sooner.    DG Chest Portable 1 View  Result Date: 01/19/2020 CLINICAL DATA:  Left arm tingling for 2 days. Shortness of breath increasing over the past few weeks. EXAM: PORTABLE CHEST 1 VIEW COMPARISON:  Single-view of the chest 02/01/2018. FINDINGS: There is cardiomegaly and pulmonary vascular congestion. No consolidative process, pneumothorax or effusion. The patient is status post CABG. Surgical clips projecting in the high in the right axilla and at the base the neck are noted. No acute bony abnormality. IMPRESSION: Cardiomegaly and vascular congestion. Electronically Signed   By: Inge Rise M.D.   On: 01/19/2020 13:08    Review Of Systems Constitutional: No fever, chills, positive weight gain. Eyes: No vision change, wears glasses. No discharge or pain. Ears: No hearing loss, No tinnitus. Respiratory: Positive asthma, COPD, pneumonias, shortness of breath. No hemoptysis. Cardiovascular: Positive chest pain, palpitation, leg edema. Gastrointestinal: Positive nausea, vomiting, diarrhea, constipation. No GI bleed. No hepatitis. Genitourinary: No dysuria, hematuria, kidney stone. No incontinance. Neurological: Positive headache, stroke, no seizures.  Psychiatry: No psych facility admission for anxiety, depression, suicide. No detox. Skin: No rash. Musculoskeletal: No joint pain, fibromyalgia. No neck pain, back pain. Lymphadenopathy: No lymphadenopathy. Hematology: Positive anemia or easy bruising.   Blood pressure (!) 159/124, pulse (!) 124, temperature 97.7 F (36.5 C), temperature source Oral, resp. rate 19, height '5\' 3"'  (1.6 m), weight 76.7 kg, SpO2 95 %. Body mass index is 29.94 kg/m. General appearance: alert, cooperative, appears stated age and severe respiratory distress Head: Normocephalic, atraumatic. Eyes: Blue eyes, pale pink conjunctiva, corneas clear.  Neck: No adenopathy, no carotid bruit, + JVD, supple,  symmetrical, trachea midline and thyroid not enlarged. Resp: Rhonchi to auscultation bilaterally. Cardio: Regular  rate and rhythm, S1, S2 normal, III/VI systolic murmur, no click, rub or gallop GI: Soft, non-tender; bowel sounds normal; no organomegaly. Extremities: 1 + edema, cyanosis or clubbing. Skin: Warm and dry.  Neurologic: Alert and oriented X 3, normal strength.   Assessment/Plan Acute diastolic left heart failure Acute on chronic kidney injury Hypertension Diet controlled type 2 DM Hyperlipidemia S/P prosthetic AV S/P CABG S/P stroke, left cerebellar and pons area Obesity Panic attack  IV lasix. IV lanoxin and IV diltiazem Xopenex Neb treatments  Time spent: Review of old records, Lab, x-rays, EKG, other cardiac tests, examination, discussion with patient over 70 minutes.  [name], MD  01/19/2020, 3:06 PM

## 2020-01-19 NOTE — Progress Notes (Signed)
ANTICOAGULATION CONSULT NOTE - Initial Consult  Pharmacy Consult for heparin Indication: atrial fibrillation   Patient Measurements: Height: 5\' 3"  (160 cm) Weight: 169 lb (76.7 kg) IBW/kg (Calculated) : 52.4 Heparin Dosing Weight: 68.8 kg  Vital Signs: Temp: 97.7 F (36.5 C) (01/24 1235) Temp Source: Oral (01/24 1235) BP: 157/98 (01/24 1300) Pulse Rate: 131 (01/24 1238)  Labs: Recent Labs    01/19/20 1250  HGB 10.6*  HCT 33.9*  PLT 398  CREATININE 2.08*  TROPONINIHS 534*    Estimated Creatinine Clearance: 26.8 mL/min (A) (by C-G formula based on SCr of 2.08 mg/dL (H)).   Medical History: Past Medical History:  Diagnosis Date  . Depression   . Diabetes mellitus without complication ([place])   . Heart disease, congenital   . High cholesterol   . Hypertension   . Panic attack   . Stroke [facility])      Assessment: New onset AFib, planning to start heparin. Pt admitted with SOB. SCr 2, hgb 10.6, plts wnl.    Goal of Therapy:  Heparin level 0.3-0.7 units/ml Monitor platelets by anticoagulation protocol: Yes    Plan:  -Heparin bolus 4000 units x1 then 1000 units/hr -Daily HL, CBC -Check level this evening    Harvel Quale 01/19/2020,2:04 PM

## 2020-01-19 NOTE — ED Notes (Signed)
1mg  ativan wasted in sharps with Laurena Bering, RN as witness. Pyxis waste feature no longer available at time wasted.

## 2020-01-19 NOTE — ED Provider Notes (Signed)
[facility] EMERGENCY DEPARTMENT Provider Note   CSN: [id number] Arrival date & time: 01/19/20  1227     History Chief Complaint  Patient presents with  . Shortness of Breath    Renee Griffin is a 64 y.o. female presenting for evaluation of shortness of breath.  Patient states for the past several months, she has had gradually worsening shortness of breath.  Patient states she is now short of breath to the point where she is having difficulty even sitting up.  She states symptoms are constant, worse with exertion.  She has a baseline cough due to smoking, no change in this recently. She has not taken anything for her symptoms.  She denies fevers, chills, chest pain, nausea, vomiting, abdominal pain, urinary symptoms, abnormal bowel movements.  Her PCP is Dr. Doylene Canard, she last saw him earlier this month.  She was having shortness of breath at the time, but it was not as bad, thus she did not talk to him about it.  She denies known recent medication changes.  She continues to smoke cigarettes daily.  She denies history of needing oxygen at home.  She does not think she is on a blood thinner.  Patient states she had a pig valve replacement 11 years ago.  Additional history obtained from chart review.  Patient with a history of depression, diabetes, hyperlipidemia, hypertension, CVA, previous GI bleed, left ovarian mass nonconcerning for cancer.   No documented or known history of A. fib, however patient is on diltiazem.  No documented history of heart failure, but patient is on Lasix.  HPI     Past Medical History:  Diagnosis Date  . Depression   . Diabetes mellitus without complication ([place])   . Heart disease, congenital   . High cholesterol   . Hypertension   . Panic attack   . Stroke [facility])     Patient Active Problem List   Diagnosis Date Noted  . Acute systolic heart failure ([place]) 01/19/2020  . Acute lower GI bleeding 12/16/2018  . Dehydration 04/13/2018    . Ovarian mass, left   . Abdominal pain 02/01/2018  . Essential hypertension 03/04/2016  . HLD (hyperlipidemia) 03/04/2016  . Type 2 diabetes mellitus with circulatory disorder ([place]) 03/04/2016  . Obesity 03/04/2016  . CVA (cerebral vascular accident) ([place]) 03/05/2015  . Bilateral low back pain without sciatica 03/05/2015  . Essential hypertension, benign 03/05/2015  . Tobacco use disorder 03/05/2015  . Hyperlipidemia 03/05/2015  . Hyperparathyroidism, primary ([place]) 02/13/2013  . Generalized ischemic cerebrovascular disease 02/06/2013  . Hyperreflexia 02/06/2013  . Abnormality of gait 02/06/2013  . Disturbance of skin sensation 02/06/2013  . CVA (cerebral infarction) 06/03/2012  . HTN (hypertension) 06/03/2012  . Dyslipidemia 06/03/2012  . Anxiety 06/03/2012    Past Surgical History:  Procedure Laterality Date  . CARDIAC SURGERY  2011   CABG  . COLONOSCOPY WITH PROPOFOL N/A 02/08/2018   Procedure: COLONOSCOPY WITH PROPOFOL;  Surgeon: Otis Brace, MD;  Location: [facility];  Service: Gastroenterology;  Laterality: N/A;  . COLONOSCOPY WITH PROPOFOL N/A 12/16/2018   Procedure: COLONOSCOPY WITH PROPOFOL;  Surgeon: Ronald Lobo, MD;  Location: [facility] ENDOS[facility];  Service: Endoscopy;  Laterality: N/A;  . DILATION AND EVACUATION     Patient states she had an abortion 30 or 40 years ago  . ESOPHAGOGASTRODUODENOSCOPY (EGD) WITH PROPOFOL Left 02/05/2018   Procedure: ESOPHAGOGASTRODUODENOSCOPY (EGD) WITH PROPOFOL;  Surgeon: Otis Brace, MD;  Location: [facility];  Service: Gastroenterology;  Laterality: Left;  .  IR ANGIOGRAM SELECTIVE EACH ADDITIONAL VESSEL  12/18/2018  . IR ANGIOGRAM SELECTIVE EACH ADDITIONAL VESSEL  12/18/2018  . IR ANGIOGRAM SELECTIVE EACH ADDITIONAL VESSEL  12/18/2018  . IR ANGIOGRAM SELECTIVE EACH ADDITIONAL VESSEL  12/18/2018  . IR ANGIOGRAM VISCERAL SELECTIVE  12/18/2018  . IR EMBO ART  VEN HEMORR LYMPH EXTRAV  INC GUIDE ROADMAPPING  12/18/2018  . IR  US GUIDE VASC ACCESS RIGHT  12/18/2018  . WISDOM TOOTH EXTRACTION       OB History    Gravida  1   Para      Term      Preterm      AB  1   Living  0     SAB      TAB  1   Ectopic      Multiple      Live Births              Family History  Problem Relation Age of Onset  . Cancer Mother        uterian OR cervical  . Cancer Father        lung  . Leukemia Brother   . Cancer Sister        breast    Social History   Tobacco Use  . Smoking status: Current Every Day Smoker    Packs/day: 1.00    Years: 50.00    Pack years: 50.00    Types: Cigarettes  . Smokeless tobacco: Never Used  Substance Use Topics  . Alcohol use: No    Alcohol/week: 0.0 standard drinks  . Drug use: No    Home Medications Prior to Admission medications   Medication Sig Start Date End Date Taking? Authorizing Provider  ACCU-CHEK AVIVA PLUS test strip See admin instructions. 01/29/16   [provider]  ACCU-CHEK SOFTCLIX LANCETS lancets See admin instructions. 01/30/16   [provider]  acetaminophen-codeine (TYLENOL #3) 300-30 MG per tablet Take 1-2 tablets by mouth daily as needed for moderate pain. Patient not taking: Reported on 12/16/2018 03/05/15   Rosalin Hawking, MD  ALPRAZolam Duanne Moron) 0.5 MG tablet Take 0.5 tablets (0.25 mg total) by mouth 2 (two) times daily as needed for anxiety. 02/24/19   Julianne Rice, MD  Blood Glucose Monitoring Suppl (ACCU-CHEK AVIVA PLUS) w/Device KIT See admin instructions. 01/29/16   [provider]  cephALEXin (KEFLEX) 500 MG capsule Take 1 capsule (500 mg total) by mouth 2 (two) times daily. 02/25/19   Julianne Rice, MD  cholecalciferol (VITAMIN D3) 25 MCG (1000 UT) tablet Take 1,000 Units by mouth daily.    [provider]  diltiazem (CARDIZEM) 90 MG tablet Take 1 tablet (90 mg total) by mouth 2 (two) times daily. 02/09/18   Dixie Dials, MD  furosemide (LASIX) 20 MG tablet Take 1 tablet (20 mg total) by mouth every  Monday, Wednesday, and Friday. Patient taking differently: Take 20 mg by mouth daily.  04/16/18   Dixie Dials, MD  gemfibrozil (LOPID) 600 MG tablet Take 600 mg by mouth 2 (two) times daily. 02/19/18   [provider]  loperamide (IMODIUM) 2 MG capsule Take 1 capsule (2 mg total) by mouth 4 (four) times daily as needed for diarrhea or loose stools. 02/24/19   Julianne Rice, MD  metFORMIN (GLUCOPHAGE) 500 MG tablet Take 1 tablet (500 mg total) by mouth 2 (two) times daily. 04/15/18   Dixie Dials, MD  metoprolol tartrate (LOPRESSOR) 25 MG tablet Take 0.5 tablets (12.5 mg total) by  mouth 2 (two) times daily. 12/25/18   Dixie Dials, MD  Multiple Vitamin (MULTIVITAMIN) tablet Take 1 tablet by mouth daily.    [provider]  ondansetron (ZOFRAN) 4 MG tablet Take 1 tablet (4 mg total) by mouth every 6 (six) hours as needed for nausea or vomiting. 02/24/19   Julianne Rice, MD  oxymetazoline (AFRIN) 0.05 % nasal spray Place 1 spray into both nostrils 2 (two) times daily.    [provider]  pantoprazole (PROTONIX) 40 MG tablet Take 1 tablet (40 mg total) by mouth 2 (two) times daily. Patient taking differently: Take 40 mg by mouth 2 (two) times daily as needed (indigestion).  02/09/18   Dixie Dials, MD  potassium chloride (K-DUR,KLOR-CON) 10 MEQ tablet Take 1 tablet (10 mEq total) by mouth daily. 12/25/18   Dixie Dials, MD  prazosin (MINIPRESS) 1 MG capsule Take 1 capsule (1 mg total) by mouth 2 (two) times daily. 02/09/18   Dixie Dials, MD  sertraline (ZOLOFT) 100 MG tablet Take 100 mg by mouth at bedtime.     [provider]  tiZANidine (ZANAFLEX) 2 MG tablet Take 1 tablet (2 mg total) by mouth at bedtime as needed for muscle spasms. 12/25/18   Dixie Dials, MD  valACYclovir (VALTREX) 500 MG tablet Take 500 mg by mouth daily.    [provider]    Allergies    Amoxicillin-pot clavulanate, Bupropion, and Nicotine  Review of Systems   Review of  Systems  Respiratory: Positive for shortness of breath.   All other systems reviewed and are negative.   Physical Exam Updated Vital Signs BP (!) 159/124   Pulse (!) 124   Temp 97.7 F (36.5 C) (Oral)   Resp 19   Ht '5\' 3"'  (1.6 m)   Wt 76.7 kg   SpO2 95%   BMI 29.94 kg/m   Physical Exam Vitals and nursing note reviewed.  Constitutional:      Appearance: She is ill-appearing.     Comments: Appears short of breath. grayish tint to skin, ?chronically ill  HENT:     Head: Normocephalic and atraumatic.  Eyes:     Conjunctiva/sclera: Conjunctivae normal.     Pupils: Pupils are equal, round, and reactive to light.  Cardiovascular:     Rate and Rhythm: Tachycardia present. Rhythm irregular.     Pulses: Normal pulses.     Comments: HR between 115 and 130. irregular Pulmonary:     Effort: Tachypnea present. No respiratory distress.     Breath sounds: Decreased air movement present. No wheezing.     Comments: Diminished lung sounds throughout.  Speaking in 2-3 word sentences.  Appears short of breath at rest Abdominal:     General: There is no distension.     Palpations: Abdomen is soft. There is no mass.     Tenderness: There is no abdominal tenderness. There is no guarding or rebound.     Hernia: A hernia (ventral hernia, baseline per pt) is present.  Musculoskeletal:        General: Normal range of motion.     Cervical back: Normal range of motion and neck supple.     Right lower leg: No edema.     Left lower leg: No edema.  Skin:    General: Skin is warm and dry.     Capillary Refill: Capillary refill takes less than 2 seconds.  Neurological:     Mental Status: She is alert and oriented to person, place, and time.  ED Results / Procedures / Treatments   Labs (all labs ordered are listed, but only abnormal results are displayed) Labs Reviewed  CBC WITH DIFFERENTIAL/PLATELET - Abnormal; Notable for the following components:      Result Value   RBC 3.53 (*)     Hemoglobin 10.6 (*)    HCT 33.9 (*)    RDW 17.6 (*)    nRBC 0.8 (*)    All other components within normal limits  COMPREHENSIVE METABOLIC PANEL - Abnormal; Notable for the following components:   CO2 18 (*)    BUN 26 (*)    Creatinine, Ser 2.08 (*)    Alkaline Phosphatase 192 (*)    GFR calc non Af Amer 25 (*)    GFR calc Af Amer 28 (*)    All other components within normal limits  BRAIN NATRIURETIC PEPTIDE - Abnormal; Notable for the following components:   B Natriuretic Peptide 2,779.1 (*)    All other components within normal limits  TROPONIN I (HIGH SENSITIVITY) - Abnormal; Notable for the following components:   Troponin I (High Sensitivity) 534 (*)    All other components within normal limits  SARS CORONAVIRUS 2 (TAT 6-24 HRS)  MAGNESIUM  TSH  URINALYSIS, ROUTINE W REFLEX MICROSCOPIC  HIV ANTIBODY (ROUTINE TESTING W REFLEX)  COMPREHENSIVE METABOLIC PANEL  HEPARIN LEVEL (UNFRACTIONATED)  POC SARS CORONAVIRUS 2 AG -  ED  TROPONIN I (HIGH SENSITIVITY)    EKG EKG Interpretation  Date/Time:  Sunday January 19 2020 12:34:11 EST Ventricular Rate:  104 PR Interval:    QRS Duration: 89 QT Interval:  338 QTC Calculation: 445 R Axis:   30 Text Interpretation: Atrial fibrillation Anterior infarct, old Confirmed by Davonna Belling [phone]) on 01/19/2020 12:39:30 PM   Radiology DG Chest Portable 1 View  Result Date: 01/19/2020 CLINICAL DATA:  Left arm tingling for 2 days. Shortness of breath increasing over the past few weeks. EXAM: PORTABLE CHEST 1 VIEW COMPARISON:  Single-view of the chest 02/01/2018. FINDINGS: There is cardiomegaly and pulmonary vascular congestion. No consolidative process, pneumothorax or effusion. The patient is status post CABG. Surgical clips projecting in the high in the right axilla and at the base the neck are noted. No acute bony abnormality. IMPRESSION: Cardiomegaly and vascular congestion. Electronically Signed   By: Inge Rise M.D.   On:  01/19/2020 13:08    Procedures .Critical Care Performed by: Franchot Heidelberg, PA-C Authorized by: Franchot Heidelberg, PA-C   Critical care provider statement:    Critical care time (minutes):  45   Critical care time was exclusive of:  Separately billable procedures and treating other patients and teaching time   Critical care was necessary to treat or prevent imminent or life-threatening deterioration of the following conditions:  Cardiac failure   Critical care was time spent personally by me on the following activities:  Blood draw for specimens, development of treatment plan with patient or surrogate, evaluation of patient's response to treatment, examination of patient, obtaining history from patient or surrogate, ordering and performing treatments and interventions, ordering and review of laboratory studies, ordering and review of radiographic studies, pulse oximetry, re-evaluation of patient's condition and review of old charts   I assumed direction of critical care for this patient from another provider in my specialty: no   Comments:     Patient critically ill.  Likely due to heart failure causing significant dyspnea and demand causing ischemia with an elevated troponin.  Patient to be admitted.  Additionally, new onset  A. fib with RVR.   (including critical care time)  Medications Ordered in ED Medications  sodium chloride flush (NS) 0.9 % injection 3 mL (3 mLs Intravenous Given 01/19/20 1440)  sodium chloride flush (NS) 0.9 % injection 3 mL (has no administration in time range)  0.9 %  sodium chloride infusion (has no administration in time range)  furosemide (LASIX) injection 80 mg (80 mg Intravenous Given 01/19/20 1435)  acetaminophen (TYLENOL) tablet 650 mg (has no administration in time range)  ondansetron (ZOFRAN) injection 4 mg (has no administration in time range)  heparin ADULT infusion 100 units/mL (25000 units/255m sodium chloride 0.45%) (1,000 Units/hr Intravenous New  Bag/Given 01/19/20 1443)  levalbuterol (XOPENEX) nebulizer solution 1.25 mg (0 mg Nebulization Not Given 01/19/20 1502)  diltiazem (CARDIZEM) 1 mg/mL load via infusion 15 mg (has no administration in time range)    And  diltiazem (CARDIZEM) 125 mg in dextrose 5% 125 mL (1 mg/mL) infusion (has no administration in time range)  sodium chloride 0.9 % bolus 500 mL (0 mLs Intravenous Stopped 01/19/20 1501)  digoxin (LANOXIN) 0.25 MG/ML injection 0.25 mg (0.25 mg Intravenous Given 01/19/20 1438)  heparin bolus via infusion 4,000 Units (4,000 Units Intravenous Bolus from Bag 01/19/20 1446)  LORazepam (ATIVAN) 2 MG/ML injection (1 mg  Given 01/19/20 1501)  levalbuterol (XOPENEX) 1.25 MG/0.5ML nebulizer solution (  Given 01/19/20 1501)    ED Course  I have reviewed the triage vital signs and the nursing notes.  Pertinent labs & imaging results that were available during my care of the patient were reviewed by me and considered in my medical decision making (see chart for details).    MDM Rules/Calculators/A&P     CHA2DS2-VASc Score: 6                 Patient presented for evaluation of shortness of breath.  Physical exam shows patient appears ill.  She is in A. fib with RVR, no documented history of the same.  Consider Covid versus ACS versus PE versus heart failure.  Will obtain labs, chest x-ray, and Covid test.   EKG concerning for global ischemia.  Chest x-ray viewed interpreted by me, shows cardiomegaly, no obvious infection.  Labs show worsening creatinine at 2.08, patient is unable to get a CT to rule out PE.  Troponin elevated at 534, possibly due to demand, but also consider ischemia. Heparin ordered.  BNP elevated at 2700, likely contributing to her shortness of breath.  Rapid Covid negative.  Will call for admission.  Discussed with Dr. KDoylene Canardwho will admit the patient.   Final Clinical Impression(s) / ED Diagnoses Final diagnoses:  Congestive heart failure, unspecified HF chronicity,  unspecified heart failure type ([place]  Atrial fibrillation with RVR ([place]  Elevated troponin    Rx / DC Orders ED Discharge Orders    None       CFranchot Heidelberg PA-C 01/19/20 1513    PDavonna Belling MD 01/19/20 1953

## 2020-01-19 NOTE — ED Notes (Signed)
Pt became agitated and stopped following commands, pulled lines and cardiac cords, O2 tubes. Admitting provider at bedside, ativan and neb tx ordered verbally, given. Pt now following commands, keepin O2 on.

## 2020-01-19 NOTE — ED Notes (Signed)
ED Provider at bedside. 

## 2020-01-19 NOTE — ED Triage Notes (Signed)
Pt presents from home for L arm tingling x 2 days, exertional SOB worsening over "weeks" that is bad enough today that pt unable to get dressed unassisted. 96% on RA but exertional SOB decreased with 2L [place] with EMS. Denies N/V, CP. EKG- Afib with RVR No h/o afib but did undergo procedure for pig valve replacement 11 yr ago.

## 2020-01-19 NOTE — ED Notes (Signed)
2L [place] provided d/t pt sats 91% RA, now 97% on 2L [place]

## 2020-01-20 ENCOUNTER — Inpatient Hospital Stay (HOSPITAL_COMMUNITY): Payer: Medicare Other

## 2020-01-20 DIAGNOSIS — I351 Nonrheumatic aortic (valve) insufficiency: Secondary | ICD-10-CM

## 2020-01-20 DIAGNOSIS — I340 Nonrheumatic mitral (valve) insufficiency: Secondary | ICD-10-CM

## 2020-01-20 LAB — ECHOCARDIOGRAM COMPLETE
Height: 63 in
Weight: 2649.05 oz

## 2020-01-20 LAB — GLUCOSE, CAPILLARY
Glucose-Capillary: 103 mg/dL — ABNORMAL HIGH (ref 70–99)
Glucose-Capillary: 138 mg/dL — ABNORMAL HIGH (ref 70–99)
Glucose-Capillary: 105 mg/dL — ABNORMAL HIGH (ref 70–99)
Glucose-Capillary: 211 mg/dL — ABNORMAL HIGH (ref 70–99)

## 2020-01-20 LAB — BASIC METABOLIC PANEL
Anion gap: 17 — ABNORMAL HIGH (ref 5–15)
BUN: 31 mg/dL — ABNORMAL HIGH (ref 8–23)
CO2: 22 mmol/L (ref 22–32)
Calcium: 9.2 mg/dL (ref 8.9–10.3)
Chloride: 103 mmol/L (ref 98–111)
Creatinine, Ser: 2.26 mg/dL — ABNORMAL HIGH (ref 0.44–1.00)
GFR calc Af Amer: 26 mL/min — ABNORMAL LOW (ref >60)
GFR calc non Af Amer: 22 mL/min — ABNORMAL LOW (ref >60)
Glucose, Bld: 105 mg/dL — ABNORMAL HIGH (ref 70–99)
Potassium: 3.5 mmol/L (ref 3.5–5.1)
Sodium: 142 mmol/L (ref 135–145)

## 2020-01-20 LAB — HEPARIN LEVEL (UNFRACTIONATED)
Heparin Unfractionated: <0.1 IU/mL — ABNORMAL LOW (ref 0.30–0.70)
Heparin Unfractionated: 0.5 IU/mL (ref 0.30–0.70)
Heparin Unfractionated: 0.26 IU/mL — ABNORMAL LOW (ref 0.30–0.70)

## 2020-01-20 MED ORDER — HEPARIN BOLUS VIA INFUSION
2000.0000 [IU] | Freq: Once | INTRAVENOUS | Status: AC
  Administered 2020-01-20: 01:00:00 2000 [IU] via INTRAVENOUS
  Filled 2020-01-20: qty 2000

## 2020-01-20 MED ORDER — DILTIAZEM HCL 60 MG PO TABS
60.0000 mg | ORAL_TABLET | Freq: Four times a day (QID) | ORAL | Status: DC
  Administered 2020-01-20 – 2020-01-22 (×9): 60 mg via ORAL
  Filled 2020-01-20 (×9): qty 1

## 2020-01-20 MED ORDER — FUROSEMIDE 40 MG PO TABS
40.0000 mg | ORAL_TABLET | Freq: Every day | ORAL | Status: DC
  Administered 2020-01-21 – 2020-01-22 (×2): 40 mg via ORAL
  Filled 2020-01-20 (×2): qty 1

## 2020-01-20 MED ORDER — POTASSIUM CHLORIDE CRYS ER 10 MEQ PO TBCR
20.0000 meq | EXTENDED_RELEASE_TABLET | Freq: Two times a day (BID) | ORAL | Status: DC
  Administered 2020-01-20 – 2020-01-21 (×2): 20 meq via ORAL
  Filled 2020-01-20 (×4): qty 2

## 2020-01-20 NOTE — Progress Notes (Addendum)
ANTICOAGULATION CONSULT NOTE  Pharmacy Consult for heparin Indication: atrial fibrillation   Patient Measurements: Height: 5\' 3"  (160 cm) Weight: 165 lb 9.1 oz (75.1 kg) IBW/kg (Calculated) : 52.4 Heparin Dosing Weight: 68.8 kg  Vital Signs: Temp: 97.5 F (36.4 C) (01/25 0808) Temp Source: Oral (01/25 0808) BP: 132/81 (01/25 0808) Pulse Rate: 67 (01/25 0808)  Labs: Recent Labs    01/19/20 1250 01/19/20 1525 01/19/20 2229 01/20/20 0604 01/20/20 0858  HGB 10.6*  --   --   --   --   HCT 33.9*  --   --   --   --   PLT 398  --   --   --   --   HEPARINUNFRC  --   --  <0.10*  --  0.50  CREATININE 2.08* 2.14*  --  2.26*  --   TROPONINIHS 534* 656*  --   --   --     Estimated Creatinine Clearance: 24.4 mL/min (A) (by C-G formula based on SCr of 2.26 mg/dL (H)).   Medical History: Past Medical History:  Diagnosis Date  . Depression   . Diabetes mellitus without complication ([place])   . Heart disease, congenital   . High cholesterol   . Hypertension   . Panic attack   . Stroke [facility])      Assessment: 64 yo female w/ new onset AFib on heparin (CHADSVASC= 7) -heparin level at goal, hg= 10.6   Goal of Therapy:  Heparin level 0.3-0.7 units/ml Monitor platelets by anticoagulation protocol: Yes    Plan:  -No heparin changes needed -Heparin level in 6 hours to confirm and daily wth CBC daily  Hildred Laser, PharmD Clinical Pharmacist **Pharmacist phone directory can now be found on [email or web address] ([email or web address]).  Listed under [place].  Addendum -heparin level= 0.26  Plan -Increase heparin to 1400 units/hr -Recheck in am  Hildred Laser, PharmD Clinical Pharmacist **Pharmacist phone directory can now be found on [email or web address] ([email or web address]).  Listed under [place].

## 2020-01-20 NOTE — Progress Notes (Signed)
ANTICOAGULATION CONSULT NOTE - Follow Up Consult  Pharmacy Consult for heparin Indication: atrial fibrillation  Labs: Recent Labs    01/19/20 1250 01/19/20 1525 01/19/20 2229  HGB 10.6*  --   --   HCT 33.9*  --   --   PLT 398  --   --   HEPARINUNFRC  --   --  <0.10*  CREATININE 2.08* 2.14*  --   TROPONINIHS 534* 656*  --     Assessment: 63yo female subtherapeutic on heparin with initial dosing for new Afib; no gtt issues or signs of bleeding per RN.  Goal of Therapy:  Heparin level 0.3-0.7 units/ml   Plan:  Will rebolus with heparin 2000 units and increase heparin gtt by 4 units/kg/hr to 1300 units/hr and check level in 8 hours.    Wynona Neat, PharmD, BCPS  01/20/2020,1:06 AM

## 2020-01-20 NOTE — Progress Notes (Signed)
Ref: Dixie Dials, MD   Subjective:  Awake. Monitor shows atrial fibrillation, pulse 77. Mild respiratory distress. Echocardiogram shows normal LV systolic function, mild septal hypertrophy, mild MR and moderate AI, dilated LA and RA.  Objective:  Vital Signs in the last 24 hours: Temp:  [97.5 F (36.4 C)-97.9 F (36.6 C)] 97.8 F (36.6 C) (01/25 1715) Pulse Rate:  [67-112] 77 (01/25 2014) Cardiac Rhythm: Atrial fibrillation (01/25 1901) Resp:  [17-18] 18 (01/25 2014) BP: (111-147)/(65-86) 129/65 (01/25 1716) SpO2:  [92 %-98 %] 94 % (01/25 2014) Weight:  [75.1 kg] 75.1 kg (01/25 0514)  Physical Exam: BP Readings from Last 1 Encounters:  01/20/20 129/65     Wt Readings from Last 1 Encounters:  01/20/20 75.1 kg    Weight change:  Body mass index is 29.33 kg/m. HEENT: [place]/AT, Eyes-Blue, PERL, EOMI, Conjunctiva-Pale pink, Sclera-Non-icteric Neck: No JVD, No bruit, Trachea midline. Lungs:  Clearing, Bilateral. Cardiac:  Regular rhythm, normal S1 and S2, no S3. III/VI systolic and diastolic murmur. Abdomen:  Soft, non-tender. BS present. Extremities:  Trace edema present. No cyanosis. No clubbing. CNS: AxOx3, Cranial nerves grossly intact, moves all 4 extremities.  Skin: Warm and dry.   Intake/Output from previous day: 01/24 0701 - 01/25 0700 In: 958.3 [P.O.:360; I.V.:98.3; IV Piggyback:500] Out: 900 [Urine:900]    Lab Results: BMET    Component Value Date/Time   NA 142 01/20/2020 0604   NA 142 01/19/2020 1525   NA 142 01/19/2020 1250   K 3.5 01/20/2020 0604   K 4.1 01/19/2020 1525   K 4.2 01/19/2020 1250   CL 103 01/20/2020 0604   CL 110 01/19/2020 1525   CL 110 01/19/2020 1250   CO2 22 01/20/2020 0604   CO2 17 (L) 01/19/2020 1525   CO2 18 (L) 01/19/2020 1250   GLUCOSE 105 (H) 01/20/2020 0604   GLUCOSE 111 (H) 01/19/2020 1525   GLUCOSE 98 01/19/2020 1250   BUN 31 (H) 01/20/2020 0604   BUN 28 (H) 01/19/2020 1525   BUN 26 (H) 01/19/2020 1250   CREATININE  2.26 (H) 01/20/2020 0604   CREATININE 2.14 (H) 01/19/2020 1525   CREATININE 2.08 (H) 01/19/2020 1250   CALCIUM 9.2 01/20/2020 0604   CALCIUM 9.2 01/19/2020 1525   CALCIUM 9.4 01/19/2020 1250   GFRNONAA 22 (L) 01/20/2020 0604   GFRNONAA 24 (L) 01/19/2020 1525   GFRNONAA 25 (L) 01/19/2020 1250   GFRAA 26 (L) 01/20/2020 0604   GFRAA 27 (L) 01/19/2020 1525   GFRAA 28 (L) 01/19/2020 1250   CBC    Component Value Date/Time   WBC 8.7 01/19/2020 1250   RBC 3.53 (L) 01/19/2020 1250   HGB 10.6 (L) 01/19/2020 1250   HCT 33.9 (L) 01/19/2020 1250   PLT 398 01/19/2020 1250   MCV 96.0 01/19/2020 1250   MCH 30.0 01/19/2020 1250   MCHC 31.3 01/19/2020 1250   RDW 17.6 (H) 01/19/2020 1250   LYMPHSABS 1.0 01/19/2020 1250   MONOABS 0.6 01/19/2020 1250   EOSABS 0.1 01/19/2020 1250   BASOSABS 0.1 01/19/2020 1250   HEPATIC Function Panel Recent Labs    02/24/19 1713 01/19/20 1250 01/19/20 1525  PROT 7.1 6.5 6.7   HEMOGLOBIN A1C No components found for: HGA1C,  MPG CARDIAC ENZYMES Lab Results  Component Value Date   CKTOTAL 735 (H) 02/01/2018   BNP No results for input(s): PROBNP in the last 8760 hours. TSH Recent Labs    01/19/20 1250  TSH 2.243   CHOLESTEROL No results for  input(s): CHOL in the last 8760 hours.  Scheduled Meds: . cholecalciferol  1,000 Units Oral Daily  . diltiazem  60 mg Oral Q6H  . [START ON 01/21/2020] furosemide  40 mg Oral Daily  . levalbuterol  1.25 mg Nebulization TID  . metoprolol tartrate  12.5 mg Oral BID  . multivitamin with minerals  1 tablet Oral Daily  . oxymetazoline  1 spray Each Nare BID  . potassium chloride  20 mEq Oral BID  . prazosin  1 mg Oral BID  . sertraline  100 mg Oral QHS  . sodium chloride flush  3 mL Intravenous Q12H  . valACYclovir  500 mg Oral Daily   Continuous Infusions: . sodium chloride    . heparin Stopped (01/20/20 1759)   PRN Meds:.sodium chloride, acetaminophen, ALPRAZolam, loperamide, ondansetron (ZOFRAN) IV,  ondansetron, sodium chloride flush, tiZANidine  Assessment/Plan: Acute diastolic left heart failure Acute on chronic kidney injury HTN Hyperlipidemia MR and AI S/P prosthetic AV S/P CABG S.P Stroke, left cerebellar and pons area Obesity Panic attack.  Discussed diet, medications and activity. BNP in Am. CXR in AM.   LOS: 1 day   Time spent including chart review, lab review, examination, discussion with patient : 30 min   Dixie Dials  MD  01/20/2020, 8:21 PM

## 2020-01-20 NOTE — Progress Notes (Signed)
  Echocardiogram 2D Echocardiogram has been performed.  Renee Griffin G [name] 01/20/2020, 11:03 AM

## 2020-01-21 ENCOUNTER — Inpatient Hospital Stay (HOSPITAL_COMMUNITY): Payer: Medicare Other

## 2020-01-21 LAB — BASIC METABOLIC PANEL
Anion gap: 14 (ref 5–15)
BUN: 38 mg/dL — ABNORMAL HIGH (ref 8–23)
CO2: 24 mmol/L (ref 22–32)
Calcium: 9.1 mg/dL (ref 8.9–10.3)
Chloride: 104 mmol/L (ref 98–111)
Creatinine, Ser: 2.25 mg/dL — ABNORMAL HIGH (ref 0.44–1.00)
GFR calc Af Amer: 26 mL/min — ABNORMAL LOW (ref >60)
GFR calc non Af Amer: 22 mL/min — ABNORMAL LOW (ref >60)
Glucose, Bld: 116 mg/dL — ABNORMAL HIGH (ref 70–99)
Potassium: 3.4 mmol/L — ABNORMAL LOW (ref 3.5–5.1)
Sodium: 142 mmol/L (ref 135–145)

## 2020-01-21 LAB — URIC ACID: Uric Acid, Serum: 9.4 mg/dL — ABNORMAL HIGH (ref 2.5–7.1)

## 2020-01-21 LAB — GLUCOSE, CAPILLARY
Glucose-Capillary: 117 mg/dL — ABNORMAL HIGH (ref 70–99)
Glucose-Capillary: 133 mg/dL — ABNORMAL HIGH (ref 70–99)
Glucose-Capillary: 139 mg/dL — ABNORMAL HIGH (ref 70–99)
Glucose-Capillary: 145 mg/dL — ABNORMAL HIGH (ref 70–99)

## 2020-01-21 LAB — CBC
HCT: 34.7 % — ABNORMAL LOW (ref 36.0–46.0)
Hemoglobin: 10.7 g/dL — ABNORMAL LOW (ref 12.0–15.0)
MCH: 28.8 pg (ref 26.0–34.0)
MCHC: 30.8 g/dL (ref 30.0–36.0)
MCV: 93.5 fL (ref 80.0–100.0)
Platelets: 375 10*3/uL (ref 150–400)
RBC: 3.71 MIL/uL — ABNORMAL LOW (ref 3.87–5.11)
RDW: 17.2 % — ABNORMAL HIGH (ref 11.5–15.5)
WBC: 8.6 10*3/uL (ref 4.0–10.5)
nRBC: 0.2 % (ref 0.0–0.2)

## 2020-01-21 LAB — HEPARIN LEVEL (UNFRACTIONATED): Heparin Unfractionated: 0.32 IU/mL (ref 0.30–0.70)

## 2020-01-21 LAB — BRAIN NATRIURETIC PEPTIDE: B Natriuretic Peptide: 1390 pg/mL — ABNORMAL HIGH (ref 0.0–100.0)

## 2020-01-21 MED ORDER — ALLOPURINOL 100 MG PO TABS
100.0000 mg | ORAL_TABLET | Freq: Every day | ORAL | Status: DC
  Administered 2020-01-21 – 2020-01-22 (×2): 100 mg via ORAL
  Filled 2020-01-21 (×3): qty 1

## 2020-01-21 MED ORDER — APIXABAN 2.5 MG PO TABS: 2.5000 mg | ORAL_TABLET | Freq: Two times a day (BID) | ORAL | Status: DC

## 2020-01-21 MED ORDER — PANTOPRAZOLE SODIUM 40 MG PO TBEC
40.0000 mg | DELAYED_RELEASE_TABLET | Freq: Every day | ORAL | Status: DC
  Administered 2020-01-21 – 2020-01-22 (×2): 40 mg via ORAL
  Filled 2020-01-21 (×2): qty 1

## 2020-01-21 MED ORDER — POTASSIUM CHLORIDE CRYS ER 20 MEQ PO TBCR
20.0000 meq | EXTENDED_RELEASE_TABLET | Freq: Three times a day (TID) | ORAL | Status: DC
  Administered 2020-01-21 – 2020-01-22 (×3): 20 meq via ORAL
  Filled 2020-01-21 (×3): qty 1

## 2020-01-21 MED ORDER — APIXABAN 2.5 MG PO TABS
2.5000 mg | ORAL_TABLET | Freq: Two times a day (BID) | ORAL | Status: DC
  Administered 2020-01-21 – 2020-01-22 (×3): 2.5 mg via ORAL
  Filled 2020-01-21 (×3): qty 1

## 2020-01-21 NOTE — Progress Notes (Signed)
Ref: Renee Dials, MD   Subjective:  Discussed need for blood thinning, cardioversion and EP consult for atrial fibrillation. Patient prefers medical therapy for now.  She understood allopurinol use.  VS stable. Monitor: A. Fibrillation. She has limited ambulation. H/O GI bleed in past. BNP is improving. CXR shows mild vascular congestion.  Objective:  Vital Signs in the last 24 hours: Temp:  [97.8 F (36.6 C)-98 F (36.7 C)] 98 F (36.7 C) (01/26 0655) Pulse Rate:  [72-98] 74 (01/26 0924) Cardiac Rhythm: Atrial fibrillation (01/26 0820) Resp:  [18] 18 (01/26 0655) BP: (111-139)/(62-97) 116/63 (01/26 1220) SpO2:  [91 %-98 %] 91 % (01/25 2103)  Physical Exam: BP Readings from Last 1 Encounters:  01/21/20 116/63     Wt Readings from Last 1 Encounters:  01/20/20 75.1 kg    Weight change:  Body mass index is 29.33 kg/m. HEENT: /AT, Eyes-Brown, Conjunctiva-Pink, Sclera-Non-icteric Neck: No JVD, No bruit, Trachea midline. Lungs:  Clear, Bilateral. Cardiac:  Regular rhythm, normal S1 and S2, no S3. II/VI systolic murmur. Abdomen:  Soft, non-tender. BS present. Extremities:  No edema present. No cyanosis. No clubbing. CNS: AxOx3, Cranial nerves grossly intact, moves all 4 extremities.  Skin: Warm and dry.   Intake/Output from previous day: 01/25 0701 - 01/26 0700 In: 1250.2 [P.O.:720; I.V.:530.2] Out: 650 [Urine:650]    Lab Results: BMET    Component Value Date/Time   NA 142 01/21/2020 0424   NA 142 01/20/2020 0604   NA 142 01/19/2020 1525   K 3.4 (L) 01/21/2020 0424   K 3.5 01/20/2020 0604   K 4.1 01/19/2020 1525   CL 104 01/21/2020 0424   CL 103 01/20/2020 0604   CL 110 01/19/2020 1525   CO2 24 01/21/2020 0424   CO2 22 01/20/2020 0604   CO2 17 (L) 01/19/2020 1525   GLUCOSE 116 (H) 01/21/2020 0424   GLUCOSE 105 (H) 01/20/2020 0604   GLUCOSE 111 (H) 01/19/2020 1525   BUN 38 (H) 01/21/2020 0424   BUN 31 (H) 01/20/2020 0604   BUN 28 (H) 01/19/2020 1525   CREATININE 2.25 (H) 01/21/2020 0424   CREATININE 2.26 (H) 01/20/2020 0604   CREATININE 2.14 (H) 01/19/2020 1525   CALCIUM 9.1 01/21/2020 0424   CALCIUM 9.2 01/20/2020 0604   CALCIUM 9.2 01/19/2020 1525   GFRNONAA 22 (L) 01/21/2020 0424   GFRNONAA 22 (L) 01/20/2020 0604   GFRNONAA 24 (L) 01/19/2020 1525   GFRAA 26 (L) 01/21/2020 0424   GFRAA 26 (L) 01/20/2020 0604   GFRAA 27 (L) 01/19/2020 1525   CBC    Component Value Date/Time   WBC 8.6 01/21/2020 0424   RBC 3.71 (L) 01/21/2020 0424   HGB 10.7 (L) 01/21/2020 0424   HCT 34.7 (L) 01/21/2020 0424   PLT 375 01/21/2020 0424   MCV 93.5 01/21/2020 0424   MCH 28.8 01/21/2020 0424   MCHC 30.8 01/21/2020 0424   RDW 17.2 (H) 01/21/2020 0424   LYMPHSABS 1.0 01/19/2020 1250   MONOABS 0.6 01/19/2020 1250   EOSABS 0.1 01/19/2020 1250   BASOSABS 0.1 01/19/2020 1250   HEPATIC Function Panel Recent Labs    02/24/19 1713 01/19/20 1250 01/19/20 1525  PROT 7.1 6.5 6.7   HEMOGLOBIN A1C No components found for: HGA1C,  MPG CARDIAC ENZYMES Lab Results  Component Value Date   CKTOTAL 735 (H) 02/01/2018   BNP No results for input(s): PROBNP in the last 8760 hours. TSH Recent Labs    01/19/20 1250  TSH 2.243   CHOLESTEROL  No results for input(s): CHOL in the last 8760 hours.  Scheduled Meds: . allopurinol  100 mg Oral Daily  . apixaban  2.5 mg Oral BID  . cholecalciferol  1,000 Units Oral Daily  . diltiazem  60 mg Oral Q6H  . furosemide  40 mg Oral Daily  . levalbuterol  1.25 mg Nebulization TID  . metoprolol tartrate  12.5 mg Oral BID  . multivitamin with minerals  1 tablet Oral Daily  . oxymetazoline  1 spray Each Nare BID  . pantoprazole  40 mg Oral Daily  . potassium chloride  20 mEq Oral BID  . prazosin  1 mg Oral BID  . sertraline  100 mg Oral QHS  . sodium chloride flush  3 mL Intravenous Q12H  . valACYclovir  500 mg Oral Daily   Continuous Infusions: . sodium chloride     PRN Meds:.sodium chloride,  acetaminophen, ALPRAZolam, loperamide, ondansetron (ZOFRAN) IV, ondansetron, sodium chloride flush, tiZANidine  Assessment/Plan: Acute diastolic left heart failure Acute on chronic kidney injury Atrial fibrillation, CHA2DS2VASc score 7 HTN Hyperlipidemia MR and AI, mild S/P prosthetic AV S/P CABG S/P left cerebellar and pons infarct Obesity Panic attack  Increase potassium. Decrease furosemide dose. Add allopurinol. Increase activity.   LOS: 2 days   Time spent including chart review, lab review, examination, discussion with patient : 30 min   Renee Dials  MD  01/21/2020, 2:02 PM

## 2020-01-21 NOTE — Evaluation (Signed)
Physical Therapy Evaluation Patient Details Name: Renee Griffin MRN: [id number] DOB: 10/06/1956 Today's Date: 01/21/2020   History of Present Illness  64yo female c/o DOE and SOB, found to be in A-fib with RVR in the ED. Unable to get CT to r/o PE so placed on Heparin. Covid negative. PMH DM, congenital heart disease, HTN, CVA, panic attack, obesity, HLD, CABG 2011  Clinical Impression   Patient received in bed, pleasant and willing to participate in session today, mobility levels as below. Fairly easily fatigued with activity but reports she does not do a lot at baseline, and that she actually feels fairly close to her baseline mobility status. SPO2 signal disrupted by grip on cane so not entirely reliable, but she did have moderate SOB with gait and did appear to possibly desat into the mid-80s with gait on room air. Able to recover back to 90s with seated rest on room air. Discussed HHPT, however she declined at this time- do feel it would be beneficial for her given reduced functional activity tolerance and weakness. She was left up in the chair with all needs met, nursing staff aware of patient status.     Follow Up Recommendations Home health PT(likely to refuse)    Equipment Recommendations  3in1 (PT)    Recommendations for Other Services       Precautions / Restrictions Precautions Precautions: Fall;Other (comment) Precaution Comments: watch HR/O2 Restrictions Weight Bearing Restrictions: No      Mobility  Bed Mobility Overal bed mobility: Modified Independent             General bed mobility comments: sat up too fast using momentum and got dizzy, recoverd with seated rest  Transfers Overall transfer level: Needs assistance Equipment used: Quad cane Transfers: Sit to/from Stand Sit to Stand: Supervision         General transfer comment: S for safety, good awareness of hand placement and sequencing  Ambulation/Gait Ambulation/Gait assistance:  Supervision Gait Distance (Feet): 30 Feet Assistive device: Quad cane Gait Pattern/deviations: Step-through pattern;Wide base of support Gait velocity: decreased   General Gait Details: slow but steady with QC in LUE, easily fatigued. SpO2 sensor on L hand so interrupted by holding cane, did seem to desat to mid-80s on RA then recovered once in chair, HR to 113  Stairs            Wheelchair Mobility    Modified Rankin (Stroke Patients Only)       Balance Overall balance assessment: Mild deficits observed, not formally tested                                           Pertinent Vitals/Pain Pain Assessment: No/denies pain    Home Living Family/patient expects to be discharged to:: Private residence Living Arrangements: Alone Available Help at Discharge: Family(brother if she was in desperate need- lives out of town in [place]) Type of Home: Apartment Home Access: Stairs to enter Entrance Stairs-Rails: None Technical brewer of Steps: 1 Home Layout: One level Home Equipment: Cane - quad      Prior Function Level of Independence: Independent with assistive device(s)               Hand Dominance        Extremity/Trunk Assessment   Upper Extremity Assessment Upper Extremity Assessment: Generalized weakness    Lower Extremity Assessment Lower Extremity Assessment: Generalized  weakness    Cervical / Trunk Assessment Cervical / Trunk Assessment: Kyphotic  Communication   Communication: No difficulties  Cognition Arousal/Alertness: Awake/alert Behavior During Therapy: WFL for tasks assessed/performed Overall Cognitive Status: Within Functional Limits for tasks assessed                                        General Comments      Exercises     Assessment/Plan    PT Assessment Patient needs continued PT services  PT Problem List Decreased strength;Obesity;Decreased activity tolerance;Decreased mobility        PT Treatment Interventions DME instruction;Balance training;Gait training;Stair training;Functional mobility training;Patient/family education;Therapeutic activities;Therapeutic exercise    PT Goals (Current goals can be found in the Care Plan section)  Acute Rehab PT Goals Patient Stated Goal: feel better, go home PT Goal Formulation: With patient Time For Goal Achievement: 02/04/20 Potential to Achieve Goals: Good    Frequency Min 3X/week   Barriers to discharge        Co-evaluation               AM-PAC PT "6 Clicks" Mobility  Outcome Measure Help needed turning from your back to your side while in a flat bed without using bedrails?: None Help needed moving from lying on your back to sitting on the side of a flat bed without using bedrails?: None Help needed moving to and from a bed to a chair (including a wheelchair)?: A Little Help needed standing up from a chair using your arms (e.g., wheelchair or bedside chair)?: A Little Help needed to walk in hospital room?: A Little Help needed climbing 3-5 steps with a railing? : A Little 6 Click Score: 20    End of Session   Activity Tolerance: Patient limited by fatigue;Patient tolerated treatment well Patient left: in chair;with call bell/phone within reach Nurse Communication: Mobility status PT Visit Diagnosis: Muscle weakness (generalized) (M62.81);Difficulty in walking, not elsewhere classified (R26.2)    Time: 1500-1520 PT Time Calculation (min) (ACUTE ONLY): 20 min   Charges:   PT Evaluation $PT Eval Low Complexity: 1 Low          Windell Norfolk, DPT, PN1   Supplemental Physical Therapist [place]    Pager [phone] Acute Rehab Office [phone]

## 2020-01-21 NOTE — Progress Notes (Addendum)
ANTICOAGULATION CONSULT NOTE  Pharmacy Consult for heparin Indication: atrial fibrillation   Patient Measurements: Height: 5\' 3"  (160 cm) Weight: 165 lb 9.1 oz (75.1 kg) IBW/kg (Calculated) : 52.4 Heparin Dosing Weight: 68.8 kg  Vital Signs: Temp: 98 F (36.7 C) (01/26 0655) Temp Source: Oral (01/26 0655) BP: 139/82 (01/26 0655) Pulse Rate: 77 (01/26 0655)  Labs: Recent Labs    01/19/20 1250 01/19/20 1250 01/19/20 1525 01/19/20 2229 01/20/20 0604 01/20/20 0858 01/20/20 1329 01/21/20 0424  HGB 10.6*  --   --   --   --   --   --  10.7*  HCT 33.9*  --   --   --   --   --   --  34.7*  PLT 398  --   --   --   --   --   --  375  HEPARINUNFRC  --   --   --    < >  --  0.50 0.26* 0.32  CREATININE 2.08*   < > 2.14*  --  2.26*  --   --  2.25*  TROPONINIHS 534*  --  656*  --   --   --   --   --    < > = values in this interval not displayed.    Estimated Creatinine Clearance: 24.5 mL/min (A) (by C-G formula based on SCr of 2.25 mg/dL (H)).   Medical History: Past Medical History:  Diagnosis Date  . Depression   . Diabetes mellitus without complication ([place])   . Heart disease, congenital   . High cholesterol   . Hypertension   . Panic attack   . Stroke [facility])      Assessment: 64 yo female w/ new onset AFib on heparin (CHADSVASC= 7).  Heparin level came back therapeutic at 0.32, on 1400 units/hr. Hgb 10.7, plt 375. No s/sx of bleeding or infusion issues - IV is positional.  Goal of Therapy:  Heparin level 0.3-0.7 units/ml Monitor platelets by anticoagulation protocol: Yes   Plan:  -Continue heparin infusion at 1400 units/hr -Monitor CBC, HL, and for s/sx of bleeding daily. -F/u plan for oral AC  Antonietta Jewel, PharmD, BCCCP Clinical Pharmacist  Phone: [phone]  Please check AMION for all [place] phone numbers After 10:00 PM, call [place] [phone]  ADDENDUM Change to apixaban 2.5 mg twice daily. Aware age<80 and wt>60 kg despite Scr> 1.5, should  be on full dose - given hx of GIB, MD would like to trial 2.5 mg BID with PPI first.   Antonietta Jewel, PharmD, [place] Clinical Pharmacist

## 2020-01-22 LAB — BASIC METABOLIC PANEL
Anion gap: 11 (ref 5–15)
BUN: 41 mg/dL — ABNORMAL HIGH (ref 8–23)
CO2: 24 mmol/L (ref 22–32)
Calcium: 9.2 mg/dL (ref 8.9–10.3)
Chloride: 108 mmol/L (ref 98–111)
Creatinine, Ser: 2.37 mg/dL — ABNORMAL HIGH (ref 0.44–1.00)
GFR calc Af Amer: 24 mL/min — ABNORMAL LOW (ref >60)
GFR calc non Af Amer: 21 mL/min — ABNORMAL LOW (ref >60)
Glucose, Bld: 134 mg/dL — ABNORMAL HIGH (ref 70–99)
Potassium: 4.4 mmol/L (ref 3.5–5.1)
Sodium: 143 mmol/L (ref 135–145)

## 2020-01-22 LAB — GLUCOSE, CAPILLARY
Glucose-Capillary: 150 mg/dL — ABNORMAL HIGH (ref 70–99)
Glucose-Capillary: 122 mg/dL — ABNORMAL HIGH (ref 70–99)
Glucose-Capillary: 129 mg/dL — ABNORMAL HIGH (ref 70–99)

## 2020-01-22 MED ORDER — ALBUTEROL SULFATE HFA 108 (90 BASE) MCG/ACT IN AERS: 2.0000 | INHALATION_SPRAY | Freq: Four times a day (QID) | RESPIRATORY_TRACT | 12 refills | Status: AC | PRN

## 2020-01-22 MED ORDER — ASPIRIN EC 81 MG PO TBEC: 81.0000 mg | DELAYED_RELEASE_TABLET | Freq: Every day | ORAL | 3 refills | Status: DC

## 2020-01-22 MED ORDER — APIXABAN 2.5 MG PO TABS: 2.5000 mg | ORAL_TABLET | Freq: Two times a day (BID) | ORAL | 3 refills | Status: DC

## 2020-01-22 MED ORDER — LEVALBUTEROL TARTRATE 45 MCG/ACT IN AERO: 2.0000 | INHALATION_SPRAY | Freq: Four times a day (QID) | RESPIRATORY_TRACT | 12 refills | Status: DC

## 2020-01-22 MED ORDER — ALLOPURINOL 100 MG PO TABS: 100.0000 mg | ORAL_TABLET | Freq: Every day | ORAL | 3 refills | Status: DC

## 2020-01-22 MED ORDER — LEVALBUTEROL TARTRATE 45 MCG/ACT IN AERO
2.0000 | INHALATION_SPRAY | Freq: Four times a day (QID) | RESPIRATORY_TRACT | Status: DC
  Filled 2020-01-22: qty 15

## 2020-01-22 MED ORDER — FUROSEMIDE 40 MG PO TABS: 40.0000 mg | ORAL_TABLET | Freq: Every day | ORAL | 3 refills | Status: DC

## 2020-01-22 MED ORDER — LEVALBUTEROL TARTRATE 45 MCG/ACT IN AERO
2.0000 | INHALATION_SPRAY | Freq: Two times a day (BID) | RESPIRATORY_TRACT | Status: DC
  Filled 2020-01-22: qty 15

## 2020-01-22 MED ORDER — POTASSIUM CHLORIDE CRYS ER 20 MEQ PO TBCR: 20.0000 meq | EXTENDED_RELEASE_TABLET | Freq: Every day | ORAL | Status: DC

## 2020-01-22 MED ORDER — POTASSIUM CHLORIDE CRYS ER 20 MEQ PO TBCR: 20.0000 meq | EXTENDED_RELEASE_TABLET | Freq: Every day | ORAL | 3 refills | Status: DC

## 2020-01-22 MED ORDER — ACETAMINOPHEN 325 MG PO TABS: 650.0000 mg | ORAL_TABLET | ORAL | Status: AC | PRN

## 2020-01-22 MED FILL — POTASSIUM CHLORIDE 20meqER: 20 | 30 days supply | Qty: 30 | Fill #0

## 2020-01-22 MED FILL — VENTOLIN HFA 90 MCG INHALER: 108 (90 BAS | 25 days supply | Qty: 18 | Fill #0

## 2020-01-22 MED FILL — ALLOPURINOL 100 MG TABLET: 100 | 30 days supply | Qty: 30 | Fill #0

## 2020-01-22 MED FILL — ELIQUIS 2.5 MG TABLET: 2.5 | 30 days supply | Qty: 60 | Fill #0

## 2020-01-22 MED FILL — FUROSEMIDE 40 MG TABLET: 40 | 30 days supply | Qty: 30 | Fill #0

## 2020-01-22 MED FILL — ASPIRIN LOW DOSE 81 MG TBEC: 81 | 90 days supply | Qty: 90 | Fill #0

## 2020-01-22 NOTE — Discharge Instructions (Signed)

## 2020-01-22 NOTE — Care Management (Signed)
[id number] Case Manager spoke with patient regarding home with home health services. Patient declined at this time. Patient states she has support of her brother and she still drives. Patient has durable medical equipment Cane, Conservation officer, nature and Bedside Commode in the home. No further needs from Case Manager at this time. Bethena Roys, RN,BSN Case Manger [phone]

## 2020-01-22 NOTE — Care Management Important Message (Signed)
Important Message  Patient Details  Name: IMAGINE NEST MRN: [id number] Date of Birth: 1956-11-27   Medicare Important Message Given:  Yes     Shelda Altes 01/22/2020, 11:47 AM

## 2020-01-22 NOTE — Progress Notes (Signed)
Physical Therapy Treatment Patient Details Name: Renee Griffin MRN: [id number] DOB: 06/19/1956 Today's Date: 01/22/2020    History of Present Illness 64yo female c/o DOE and SOB, found to be in A-fib with RVR in the ED. Unable to get CT to r/o PE so placed on Heparin. Covid negative. PMH DM, congenital heart disease, HTN, CVA, panic attack, obesity, HLD, CABG 2011    PT Comments    Patient received in bed, willing to participate in PT. See mobility/levels of assist below. Tolerated progression of gait well today, VSS on 2LPM O2 per [place]; remains steady with QC but insists that she does not do much at baseline/refused hallway ambulation today. Attempted to encourage her to get OOB to chair but she refused- unable to provide significant reason for refusal when questioned about reluctance for spending time OOB. Left in bed with all needs met this afternoon, progressing well.     Follow Up Recommendations  Home health PT(likely to refuse)     Equipment Recommendations  3in1 (PT)    Recommendations for Other Services       Precautions / Restrictions Precautions Precautions: Fall;Other (comment) Precaution Comments: watch HR/O2 Restrictions Weight Bearing Restrictions: No    Mobility  Bed Mobility Overal bed mobility: Modified Independent             General bed mobility comments: conitnues to use momentum, no dizziness today  Transfers Overall transfer level: Needs assistance Equipment used: Quad cane Transfers: Sit to/from Stand Sit to Stand: Modified independent (Device/Increase time)         General transfer comment: assist only to manage lines  Ambulation/Gait Ambulation/Gait assistance: Supervision Gait Distance (Feet): 60 Feet Assistive device: Quad cane Gait Pattern/deviations: Step-through pattern;Wide base of support Gait velocity: decreased   General Gait Details: continues to remain slow but steady wtih QC, easily fatigued and VSS on 2LPM O2. Tolerated  more activity today.   Stairs             Wheelchair Mobility    Modified Rankin (Stroke Patients Only)       Balance Overall balance assessment: Mild deficits observed, not formally tested                                          Cognition Arousal/Alertness: Awake/alert Behavior During Therapy: WFL for tasks assessed/performed Overall Cognitive Status: Within Functional Limits for tasks assessed                                        Exercises      General Comments        Pertinent Vitals/Pain Pain Assessment: No/denies pain    Home Living                      Prior Function            PT Goals (current goals can now be found in the care plan section) Acute Rehab PT Goals Patient Stated Goal: feel better, go home PT Goal Formulation: With patient Time For Goal Achievement: 02/04/20 Potential to Achieve Goals: Good Progress towards PT goals: Progressing toward goals    Frequency    Min 3X/week      PT Plan Current plan remains appropriate    Co-evaluation  AM-PAC PT "6 Clicks" Mobility   Outcome Measure  Help needed turning from your back to your side while in a flat bed without using bedrails?: None Help needed moving from lying on your back to sitting on the side of a flat bed without using bedrails?: None Help needed moving to and from a bed to a chair (including a wheelchair)?: None Help needed standing up from a chair using your arms (e.g., wheelchair or bedside chair)?: None Help needed to walk in hospital room?: A Little Help needed climbing 3-5 steps with a railing? : A Little 6 Click Score: 22    End of Session Equipment Utilized During Treatment: Oxygen Activity Tolerance: Patient tolerated treatment well Patient left: in bed;with call bell/phone within reach   PT Visit Diagnosis: Muscle weakness (generalized) (M62.81);Difficulty in walking, not elsewhere classified  (R26.2)     Time: 1484-0397 PT Time Calculation (min) (ACUTE ONLY): 10 min  Charges:  $Gait Training: 8-22 mins                     Windell Norfolk, DPT, PN1   Supplemental Physical Therapist [place]    Pager [phone] Acute Rehab Office [phone]

## 2020-01-22 NOTE — Discharge Summary (Signed)
Physician Discharge Summary  Patient ID: Renee Griffin MRN: [id number] DOB/AGE: 03-28-1956 64 y.o.  Admit date: 01/19/2020 Discharge date: 01/22/2020  Admission Diagnoses: Acute diastolic left heart failure Acute on chronic kidney injury Hypertension Diet controlled type 2 DM Hyperlipidemia S/P prosthetic AV S/P CABG S/P left cerebellar and pons area stroke Obesity Panic attack  Discharge Diagnoses:  Principle problem: Acute diastolic left heart failure Active Problems:   Acute on chronic kidney injury   Atrial fibrillation, CHA2DS2VASc score 7   Hypertention   Diet controlled type 2 DM   Hyperlipidemia   S/P CABG   S/P prostjetic AV   S/P CABG   S/P left cerebellar and pons infarct   Obesity   Panic attack   Hyperuricemia without gout   Anemia of chronic kidney disease  Discharged Condition: fair  Hospital Course: 64 years old white female with PMH of HTN, type 2 DM, CKD, III, stroke, CABG with Bioprosthetic AV, hyperlipidemia, tobacco use disorder, panic attack and obesity had exertional dyspnea and leg edema. She was in atrial fibrillation with RVR. Her BNP was 2779.1 pg/mL She needed IV lorazepam for one panic attack in ER. She responded to IV diltiazem and IV furosemide. With h/o GI bleed in past she was started on lower dose of Eliquis. She preferred medical treatment for now and understood decreasing fluid and salt intake by about 50 %.  She refused home PT. She was discharged home in stable condition with f/u by me in 1 week.  Consults: cardiology  Significant Diagnostic Studies: labs: Hgb 10.7 otherwise CBC unremarkable. Normal electrolytes with mild hyperglycemia and creatinine of 2.14 to 2.37 mg. BNP 2779.1 decreased to 1390 in 3 days. Uric acid level was 9.4 mg. Hgb A1C is pending. Lipid panel pending. Troponin I mildly elevated.  EKG on admission: Atrial fibrillation with moderate ventricular response, old Anteroseptal wall MI. Next day EKG: atrial  fibrillation with controlled ventricular response.  Chest x-ray adm and f/u: Cardiomegaly with vascular congestion.  Echocardiogram-01/20/20: Normal LV systolic function, mild LVH, Moderate AI, Mild MR dilated LA and RA.  Treatments: cardiac meds: Aspirin, Eliquis, metoprolol, diltiazem, furosemide and prazosin. Other Meds: Albuterol MDI, allopurinol, Gemfibrozil.  Discharge Exam: Blood pressure (!) 111/52, pulse 79, temperature (!) 97.5 F (36.4 C), temperature source Oral, resp. rate 18, height _0  (1.6 m), weight 74.9 kg, SpO2 98 %. General appearance: alert, cooperative and appears stated age. Head: Normocephalic, atraumatic. Eyes: Brown eyes, pale pink conjunctiva, corneas clear. PERRL, EOM's intact.  Neck: No adenopathy, no carotid bruit, no JVD, supple, symmetrical, trachea midline and thyroid not enlarged. Resp: Clearing to auscultation bilaterally. Cardio: Regular rate and rhythm, S1, S2 normal, II/VI systolic murmur, no click, rub or gallop. GI: Soft, non-tender; bowel sounds normal; no organomegaly. Extremities: No edema, cyanosis or clubbing. Skin: Warm and dry.  Neurologic: Alert and oriented X 3, normal strength and tone. Normal coordination and slow gait.  Disposition: Discharge disposition: 01-Home or Self Care        Allergies as of 01/22/2020      Reactions   Amoxicillin-pot Clavulanate Diarrhea   Has patient had a PCN reaction causing immediate rash, facial/tongue/throat swelling, SOB or lightheadedness with hypotension: Unknown Has patient had a PCN reaction causing severe rash involving mucus membranes or skin necrosis:UnknoWN Has patient had a PCN reaction that required hospitalization: Unknown Has patient had a PCN reaction occurring within the last 10 years: Unknown If all of the above answers are "NO", then may proceed with Cephalosporin  use.   Bupropion Nausea Only   Nicotine Nausea Only, Rash   Reaction to patches      Medication List    STOP  taking these medications   ibuprofen 200 MG tablet Commonly known as: ADVIL   metFORMIN 500 MG tablet Commonly known as: GLUCOPHAGE     TAKE these medications   Accu-Chek Aviva Plus test strip Generic drug: glucose blood See admin instructions.   Accu-Chek Aviva Plus w/Device Kit See admin instructions.   Accu-Chek Softclix Lancets lancets See admin instructions.   acetaminophen 325 MG tablet Commonly known as: TYLENOL Take 2 tablets (650 mg total) by mouth every 4 (four) hours as needed for headache or mild pain. What changed:   medication strength  how much to take  when to take this  reasons to take this   allopurinol 100 MG tablet Commonly known as: ZYLOPRIM Take 1 tablet (100 mg total) by mouth daily. Start taking on: January 23, 2020   ALPRAZolam 0.5 MG tablet Commonly known as: XANAX Take 0.5 tablets (0.25 mg total) by mouth 2 (two) times daily as needed for anxiety. What changed: when to take this   apixaban 2.5 MG Tabs tablet Commonly known as: ELIQUIS Take 1 tablet (2.5 mg total) by mouth 2 (two) times daily.   aspirin 325 MG tablet Take 325 mg by mouth once.   cholecalciferol 25 MCG (1000 UNIT) tablet Commonly known as: VITAMIN D3 Take 1,000 Units by mouth daily.   diltiazem 90 MG tablet Commonly known as: CARDIZEM Take 1 tablet (90 mg total) by mouth 2 (two) times daily.   diphenhydrAMINE 50 MG tablet Commonly known as: BENADRYL Take 50 mg by mouth at bedtime.   furosemide 40 MG tablet Commonly known as: LASIX Take 1 tablet (40 mg total) by mouth daily. Start taking on: January 23, 2020 What changed:   medication strength  how much to take  when to take this   gemfibrozil 600 MG tablet Commonly known as: LOPID Take 600 mg by mouth 2 (two) times daily.   levalbuterol 45 MCG/ACT inhaler Commonly known as: XOPENEX HFA Inhale 2 puffs into the lungs every 6 (six) hours.   loperamide 2 MG capsule Commonly known as: IMODIUM Take  1 capsule (2 mg total) by mouth 4 (four) times daily as needed for diarrhea or loose stools. What changed: when to take this   metoprolol tartrate 25 MG tablet Commonly known as: LOPRESSOR Take 0.5 tablets (12.5 mg total) by mouth 2 (two) times daily.   multivitamin with minerals Tabs tablet Take 1 tablet by mouth daily.   ondansetron 4 MG tablet Commonly known as: ZOFRAN Take 1 tablet (4 mg total) by mouth every 6 (six) hours as needed for nausea or vomiting.   oxymetazoline 0.05 % nasal spray Commonly known as: AFRIN Place 1 spray into both nostrils 2 (two) times daily.   pantoprazole 40 MG tablet Commonly known as: PROTONIX Take 1 tablet (40 mg total) by mouth 2 (two) times daily. What changed:   when to take this  reasons to take this   potassium chloride SA 20 MEQ tablet Commonly known as: KLOR-CON Take 1 tablet (20 mEq total) by mouth daily. Start taking on: January 23, 2020 What changed:   medication strength  how much to take   prazosin 1 MG capsule Commonly known as: MINIPRESS Take 1 capsule (1 mg total) by mouth 2 (two) times daily.   sertraline 100 MG tablet Commonly known as: ZOLOFT Take  100 mg by mouth at bedtime.   tiZANidine 2 MG tablet Commonly known as: ZANAFLEX Take 1 tablet (2 mg total) by mouth at bedtime as needed for muscle spasms. What changed: when to take this   valACYclovir 500 MG tablet Commonly known as: VALTREX Take 500 mg by mouth daily.      Follow-up Information    Dixie Dials, MD. Schedule an appointment as soon as possible for a visit in 1 week(s).   Specialty: Cardiology Contact information: [place] [address] [phone]           Time spent: Review of old chart, current chart, lab, x-ray, cardiac tests and discussion with patient over 60 minutes.  Signed: [name] 01/22/2020, 10:42 AM

## 2020-01-30 ENCOUNTER — Telehealth: Payer: Self-pay | Admitting: Oncology

## 2020-01-30 NOTE — Telephone Encounter (Signed)
Renee Griffin and asked if she has follow up appointment scheduled with Dr. Ulanda Edison for a pelvic ultrasound per Dr. Serita Grit recommendation.  She said she does not.  Asked if she would like me to call to set up the appointment and she said not now.  She will call Dr. Marijean Heath office when she is ready.

## 2020-02-07 ENCOUNTER — Emergency Department (HOSPITAL_COMMUNITY): Payer: Medicare Other

## 2020-02-07 ENCOUNTER — Encounter (HOSPITAL_COMMUNITY): Payer: Self-pay | Admitting: Emergency Medicine

## 2020-02-07 ENCOUNTER — Other Ambulatory Visit: Payer: Self-pay

## 2020-02-07 ENCOUNTER — Inpatient Hospital Stay (HOSPITAL_COMMUNITY)
Admission: EM | Admit: 2020-02-07 | Discharge: 2020-02-14 | DRG: 280 | Disposition: A | Discharge status: Home / Self Care | Payer: Medicare Other | Attending: Cardiovascular Disease | Admitting: Cardiovascular Disease

## 2020-02-07 DIAGNOSIS — Z79899 Other long term (current) drug therapy: Secondary | ICD-10-CM

## 2020-02-07 DIAGNOSIS — J449 Chronic obstructive pulmonary disease, unspecified: Secondary | ICD-10-CM | POA: Diagnosis present

## 2020-02-07 DIAGNOSIS — I4891 Unspecified atrial fibrillation: Secondary | ICD-10-CM | POA: Diagnosis present

## 2020-02-07 DIAGNOSIS — I25810 Atherosclerosis of coronary artery bypass graft(s) without angina pectoris: Secondary | ICD-10-CM | POA: Diagnosis present

## 2020-02-07 DIAGNOSIS — Z888 Allergy status to other drugs, medicaments and biological substances status: Secondary | ICD-10-CM | POA: Diagnosis not present

## 2020-02-07 DIAGNOSIS — E669 Obesity, unspecified: Secondary | ICD-10-CM | POA: Diagnosis present

## 2020-02-07 DIAGNOSIS — I214 Non-ST elevation (NSTEMI) myocardial infarction: Principal | ICD-10-CM | POA: Diagnosis present

## 2020-02-07 DIAGNOSIS — D631 Anemia in chronic kidney disease: Secondary | ICD-10-CM | POA: Diagnosis present

## 2020-02-07 DIAGNOSIS — Z951 Presence of aortocoronary bypass graft: Secondary | ICD-10-CM | POA: Diagnosis not present

## 2020-02-07 DIAGNOSIS — E875 Hyperkalemia: Secondary | ICD-10-CM | POA: Diagnosis not present

## 2020-02-07 DIAGNOSIS — Z20822 Contact with and (suspected) exposure to covid-19: Secondary | ICD-10-CM | POA: Diagnosis present

## 2020-02-07 DIAGNOSIS — Z880 Allergy status to penicillin: Secondary | ICD-10-CM | POA: Diagnosis not present

## 2020-02-07 DIAGNOSIS — Z9114 Patient's other noncompliance with medication regimen: Secondary | ICD-10-CM

## 2020-02-07 DIAGNOSIS — I2510 Atherosclerotic heart disease of native coronary artery without angina pectoris: Secondary | ICD-10-CM | POA: Diagnosis present

## 2020-02-07 DIAGNOSIS — E1122 Type 2 diabetes mellitus with diabetic chronic kidney disease: Secondary | ICD-10-CM | POA: Diagnosis present

## 2020-02-07 DIAGNOSIS — F410 Panic disorder [episodic paroxysmal anxiety] without agoraphobia: Secondary | ICD-10-CM | POA: Diagnosis present

## 2020-02-07 DIAGNOSIS — E7800 Pure hypercholesterolemia, unspecified: Secondary | ICD-10-CM | POA: Diagnosis present

## 2020-02-07 DIAGNOSIS — I130 Hypertensive heart and chronic kidney disease with heart failure and stage 1 through stage 4 chronic kidney disease, or unspecified chronic kidney disease: Secondary | ICD-10-CM | POA: Diagnosis present

## 2020-02-07 DIAGNOSIS — Z6829 Body mass index (BMI) 29.0-29.9, adult: Secondary | ICD-10-CM

## 2020-02-07 DIAGNOSIS — N183 Chronic kidney disease, stage 3 unspecified: Secondary | ICD-10-CM | POA: Diagnosis present

## 2020-02-07 DIAGNOSIS — R109 Unspecified abdominal pain: Secondary | ICD-10-CM

## 2020-02-07 DIAGNOSIS — F329 Major depressive disorder, single episode, unspecified: Secondary | ICD-10-CM | POA: Diagnosis present

## 2020-02-07 DIAGNOSIS — M797 Fibromyalgia: Secondary | ICD-10-CM | POA: Diagnosis present

## 2020-02-07 DIAGNOSIS — F17210 Nicotine dependence, cigarettes, uncomplicated: Secondary | ICD-10-CM | POA: Diagnosis present

## 2020-02-07 DIAGNOSIS — D509 Iron deficiency anemia, unspecified: Secondary | ICD-10-CM | POA: Diagnosis present

## 2020-02-07 DIAGNOSIS — Z952 Presence of prosthetic heart valve: Secondary | ICD-10-CM | POA: Diagnosis not present

## 2020-02-07 DIAGNOSIS — I5033 Acute on chronic diastolic (congestive) heart failure: Secondary | ICD-10-CM | POA: Diagnosis present

## 2020-02-07 DIAGNOSIS — R110 Nausea: Secondary | ICD-10-CM | POA: Diagnosis present

## 2020-02-07 DIAGNOSIS — Z8673 Personal history of transient ischemic attack (TIA), and cerebral infarction without residual deficits: Secondary | ICD-10-CM

## 2020-02-07 DIAGNOSIS — Z7982 Long term (current) use of aspirin: Secondary | ICD-10-CM

## 2020-02-07 DIAGNOSIS — I272 Pulmonary hypertension, unspecified: Secondary | ICD-10-CM | POA: Diagnosis present

## 2020-02-07 DIAGNOSIS — Z7902 Long term (current) use of antithrombotics/antiplatelets: Secondary | ICD-10-CM

## 2020-02-07 LAB — URINALYSIS, ROUTINE W REFLEX MICROSCOPIC
Bilirubin Urine: NEGATIVE
Glucose, UA: NEGATIVE mg/dL
Hgb urine dipstick: NEGATIVE
Ketones, ur: NEGATIVE mg/dL
Nitrite: NEGATIVE
Protein, ur: 30 mg/dL — AB
Specific Gravity, Urine: 1.018 (ref 1.005–1.030)
pH: 5 (ref 5.0–8.0)

## 2020-02-07 LAB — HEPATIC FUNCTION PANEL
ALT: 14 U/L (ref 0–44)
AST: 29 U/L (ref 15–41)
Albumin: 3.5 g/dL (ref 3.5–5.0)
Alkaline Phosphatase: 160 U/L — ABNORMAL HIGH (ref 38–126)
Bilirubin, Direct: <0.1 mg/dL (ref 0.0–0.2)
Total Bilirubin: 0.7 mg/dL (ref 0.3–1.2)
Total Protein: 7 g/dL (ref 6.5–8.1)

## 2020-02-07 LAB — BASIC METABOLIC PANEL
Anion gap: 15 (ref 5–15)
BUN: 33 mg/dL — ABNORMAL HIGH (ref 8–23)
CO2: 16 mmol/L — ABNORMAL LOW (ref 22–32)
Calcium: 9.5 mg/dL (ref 8.9–10.3)
Chloride: 112 mmol/L — ABNORMAL HIGH (ref 98–111)
Creatinine, Ser: 2 mg/dL — ABNORMAL HIGH (ref 0.44–1.00)
GFR calc Af Amer: 30 mL/min — ABNORMAL LOW (ref >60)
GFR calc non Af Amer: 26 mL/min — ABNORMAL LOW (ref >60)
Glucose, Bld: 96 mg/dL (ref 70–99)
Potassium: 4.4 mmol/L (ref 3.5–5.1)
Sodium: 143 mmol/L (ref 135–145)

## 2020-02-07 LAB — CBC
HCT: 32.9 % — ABNORMAL LOW (ref 36.0–46.0)
Hemoglobin: 10.1 g/dL — ABNORMAL LOW (ref 12.0–15.0)
MCH: 29 pg (ref 26.0–34.0)
MCHC: 30.7 g/dL (ref 30.0–36.0)
MCV: 94.5 fL (ref 80.0–100.0)
Platelets: 315 10*3/uL (ref 150–400)
RBC: 3.48 MIL/uL — ABNORMAL LOW (ref 3.87–5.11)
RDW: 17.4 % — ABNORMAL HIGH (ref 11.5–15.5)
WBC: 7.4 10*3/uL (ref 4.0–10.5)
nRBC: 0.3 % — ABNORMAL HIGH (ref 0.0–0.2)

## 2020-02-07 LAB — APTT: aPTT: 44 seconds — ABNORMAL HIGH (ref 24–36)

## 2020-02-07 LAB — BRAIN NATRIURETIC PEPTIDE: B Natriuretic Peptide: 2622.5 pg/mL — ABNORMAL HIGH (ref 0.0–100.0)

## 2020-02-07 LAB — RESPIRATORY PANEL BY RT PCR (FLU A&B, COVID)
Influenza A by PCR: NEGATIVE
Influenza B by PCR: NEGATIVE
SARS Coronavirus 2 by RT PCR: NEGATIVE

## 2020-02-07 LAB — TROPONIN I (HIGH SENSITIVITY)
Troponin I (High Sensitivity): 2519 ng/L (ref <18)
Troponin I (High Sensitivity): 2907 ng/L (ref <18)

## 2020-02-07 LAB — GLUCOSE, CAPILLARY: Glucose-Capillary: 106 mg/dL — ABNORMAL HIGH (ref 70–99)

## 2020-02-07 LAB — MRSA PCR SCREENING: MRSA by PCR: NEGATIVE

## 2020-02-07 LAB — HEPARIN LEVEL (UNFRACTIONATED): Heparin Unfractionated: 0.14 IU/mL — ABNORMAL LOW (ref 0.30–0.70)

## 2020-02-07 LAB — LIPASE, BLOOD: Lipase: 14 U/L (ref 11–51)

## 2020-02-07 MED ORDER — ALLOPURINOL 100 MG PO TABS
100.0000 mg | ORAL_TABLET | Freq: Every day | ORAL | Status: DC
  Administered 2020-02-07 – 2020-02-14 (×8): 100 mg via ORAL
  Filled 2020-02-07 (×9): qty 1

## 2020-02-07 MED ORDER — ALPRAZOLAM 0.25 MG PO TABS
0.2500 mg | ORAL_TABLET | Freq: Three times a day (TID) | ORAL | Status: DC
  Administered 2020-02-07 – 2020-02-10 (×8): 0.25 mg via ORAL
  Filled 2020-02-07 (×8): qty 1

## 2020-02-07 MED ORDER — ACETAMINOPHEN 325 MG PO TABS
650.0000 mg | ORAL_TABLET | ORAL | Status: DC | PRN
  Administered 2020-02-08 – 2020-02-10 (×4): 650 mg via ORAL
  Filled 2020-02-07 (×5): qty 2

## 2020-02-07 MED ORDER — DILTIAZEM LOAD VIA INFUSION
15.0000 mg | Freq: Once | INTRAVENOUS | Status: AC
  Administered 2020-02-07: 15 mg via INTRAVENOUS
  Filled 2020-02-07: qty 15

## 2020-02-07 MED ORDER — ONDANSETRON HCL 4 MG/2ML IJ SOLN
4.0000 mg | Freq: Once | INTRAMUSCULAR | Status: AC
  Administered 2020-02-07: 4 mg via INTRAVENOUS
  Filled 2020-02-07: qty 2

## 2020-02-07 MED ORDER — TIZANIDINE HCL 2 MG PO TABS
2.0000 mg | ORAL_TABLET | Freq: Every day | ORAL | Status: DC
  Administered 2020-02-07 – 2020-02-13 (×7): 2 mg via ORAL
  Filled 2020-02-07 (×8): qty 1

## 2020-02-07 MED ORDER — ONDANSETRON HCL 4 MG/2ML IJ SOLN
4.0000 mg | Freq: Four times a day (QID) | INTRAMUSCULAR | Status: DC | PRN
  Administered 2020-02-11 – 2020-02-14 (×3): 4 mg via INTRAVENOUS
  Filled 2020-02-07 (×4): qty 2

## 2020-02-07 MED ORDER — DEXTROSE-NACL 5-0.45 % IV SOLN: INTRAVENOUS | Status: DC

## 2020-02-07 MED ORDER — VITAMIN D 25 MCG (1000 UNIT) PO TABS
1000.0000 [IU] | ORAL_TABLET | Freq: Every day | ORAL | Status: DC
  Administered 2020-02-07 – 2020-02-14 (×7): 1000 [IU] via ORAL
  Filled 2020-02-07 (×9): qty 1

## 2020-02-07 MED ORDER — ASPIRIN 300 MG RE SUPP: 300.0000 mg | RECTAL | Status: AC

## 2020-02-07 MED ORDER — VALACYCLOVIR HCL 500 MG PO TABS
500.0000 mg | ORAL_TABLET | Freq: Every day | ORAL | Status: DC
  Administered 2020-02-08 – 2020-02-14 (×7): 500 mg via ORAL
  Filled 2020-02-07 (×10): qty 1

## 2020-02-07 MED ORDER — POTASSIUM CHLORIDE CRYS ER 20 MEQ PO TBCR
20.0000 meq | EXTENDED_RELEASE_TABLET | Freq: Every day | ORAL | Status: DC
  Administered 2020-02-08 – 2020-02-12 (×4): 20 meq via ORAL
  Filled 2020-02-07 (×6): qty 1

## 2020-02-07 MED ORDER — NITROGLYCERIN 0.4 MG SL SUBL: 0.4000 mg | SUBLINGUAL_TABLET | SUBLINGUAL | Status: DC | PRN

## 2020-02-07 MED ORDER — SODIUM CHLORIDE 0.9% FLUSH
3.0000 mL | Freq: Once | INTRAVENOUS | Status: AC
  Administered 2020-02-11: 3 mL via INTRAVENOUS

## 2020-02-07 MED ORDER — FUROSEMIDE 10 MG/ML IJ SOLN
40.0000 mg | Freq: Every day | INTRAMUSCULAR | Status: DC
  Administered 2020-02-07 – 2020-02-10 (×4): 40 mg via INTRAVENOUS
  Filled 2020-02-07 (×4): qty 4

## 2020-02-07 MED ORDER — SERTRALINE HCL 100 MG PO TABS
100.0000 mg | ORAL_TABLET | Freq: Every day | ORAL | Status: DC
  Administered 2020-02-07 – 2020-02-13 (×7): 100 mg via ORAL
  Filled 2020-02-07 (×7): qty 1

## 2020-02-07 MED ORDER — HEPARIN (PORCINE) 25000 UT/250ML-% IV SOLN
1200.0000 [IU]/h | INTRAVENOUS | Status: DC
  Administered 2020-02-07: 800 [IU]/h via INTRAVENOUS
  Administered 2020-02-08: 1200 [IU]/h via INTRAVENOUS
  Administered 2020-02-08: 1000 [IU]/h via INTRAVENOUS
  Administered 2020-02-09: 1200 [IU]/h via INTRAVENOUS
  Filled 2020-02-07 (×3): qty 250

## 2020-02-07 MED ORDER — ASPIRIN EC 81 MG PO TBEC
81.0000 mg | DELAYED_RELEASE_TABLET | Freq: Every day | ORAL | Status: DC
  Administered 2020-02-08 – 2020-02-14 (×7): 81 mg via ORAL
  Filled 2020-02-07 (×8): qty 1

## 2020-02-07 MED ORDER — PANTOPRAZOLE SODIUM 40 MG PO TBEC
40.0000 mg | DELAYED_RELEASE_TABLET | Freq: Every day | ORAL | Status: DC
  Administered 2020-02-08 – 2020-02-13 (×7): 40 mg via ORAL
  Filled 2020-02-07 (×7): qty 1

## 2020-02-07 MED ORDER — ALBUTEROL SULFATE (2.5 MG/3ML) 0.083% IN NEBU: 3.0000 mL | INHALATION_SOLUTION | Freq: Four times a day (QID) | RESPIRATORY_TRACT | Status: DC | PRN

## 2020-02-07 MED ORDER — METOPROLOL TARTRATE 25 MG PO TABS
12.5000 mg | ORAL_TABLET | Freq: Once | ORAL | Status: AC
  Administered 2020-02-07: 12.5 mg via ORAL
  Filled 2020-02-07: qty 1

## 2020-02-07 MED ORDER — DILTIAZEM HCL-DEXTROSE 125-5 MG/125ML-% IV SOLN (PREMIX)
5.0000 mg/h | INTRAVENOUS | Status: DC
  Administered 2020-02-07: 5 mg/h via INTRAVENOUS
  Administered 2020-02-08 (×3): 10 mg/h via INTRAVENOUS
  Filled 2020-02-07 (×3): qty 125

## 2020-02-07 MED ORDER — ONDANSETRON HCL 4 MG PO TABS: 4.0000 mg | ORAL_TABLET | Freq: Four times a day (QID) | ORAL | Status: DC | PRN

## 2020-02-07 MED ORDER — PANTOPRAZOLE SODIUM 40 MG PO TBEC: 40.0000 mg | DELAYED_RELEASE_TABLET | Freq: Two times a day (BID) | ORAL | Status: DC

## 2020-02-07 MED ORDER — OXYMETAZOLINE HCL 0.05 % NA SOLN
1.0000 | Freq: Two times a day (BID) | NASAL | Status: DC
  Administered 2020-02-08 – 2020-02-11 (×7): 1 via NASAL
  Filled 2020-02-07 (×2): qty 30

## 2020-02-07 MED ORDER — ADULT MULTIVITAMIN W/MINERALS CH
1.0000 | ORAL_TABLET | Freq: Every day | ORAL | Status: DC
  Administered 2020-02-07 – 2020-02-14 (×7): 1 via ORAL
  Filled 2020-02-07 (×9): qty 1

## 2020-02-07 MED ORDER — ASPIRIN 81 MG PO CHEW
324.0000 mg | CHEWABLE_TABLET | ORAL | Status: AC
  Administered 2020-02-07: 324 mg via ORAL
  Filled 2020-02-07: qty 4

## 2020-02-07 MED ORDER — PROMETHAZINE HCL 25 MG/ML IJ SOLN
12.5000 mg | Freq: Once | INTRAMUSCULAR | Status: AC
  Administered 2020-02-07: 12.5 mg via INTRAVENOUS
  Filled 2020-02-07: qty 1

## 2020-02-07 MED ORDER — METOPROLOL TARTRATE 25 MG PO TABS
12.5000 mg | ORAL_TABLET | Freq: Two times a day (BID) | ORAL | Status: DC
  Administered 2020-02-07: 12.5 mg via ORAL
  Filled 2020-02-07 (×2): qty 1

## 2020-02-07 MED ORDER — PANTOPRAZOLE SODIUM 40 MG IV SOLR
40.0000 mg | INTRAVENOUS | Status: DC
  Filled 2020-02-07: qty 40

## 2020-02-07 NOTE — Progress Notes (Signed)
Ref: Dixie Dials, MD   Subjective:  Awake. Breathing has improved post IV furosemide. BP is normalized.  Troponin I levels are high. Patient remains chest pain free. H/O GI bleed in past. Did not take Eliquis for 2 days.  Objective:  Vital Signs in the last 24 hours: Temp:  [96.7 F (35.9 C)-97.7 F (36.5 C)] 96.7 F (35.9 C) (02/12 2043) Pulse Rate:  [51-134] 88 (02/12 1925) Cardiac Rhythm: Atrial fibrillation (02/12 1654) Resp:  [16-25] 25 (02/12 1925) BP: (107-135)/(87-112) 107/92 (02/12 1925) SpO2:  [94 %-98 %] 98 % (02/12 1925)  Physical Exam: BP Readings from Last 1 Encounters:  02/07/20 (!) 107/92     Wt Readings from Last 1 Encounters:  01/22/20 74.9 kg    Weight change:  There is no height or weight on file to calculate BMI. HEENT: [place]/AT, Eyes-Blue, PERL, EOMI, Conjunctiva-Pink, Sclera-Non-icteric Neck: No JVD, No bruit, Trachea midline. Lungs:  Clearing, Bilateral. Cardiac:  Irregular rhythm, normal S1 and S2, no S3. II/VI systolic murmur. Abdomen:  Soft, non-tender. BS present. Extremities:  Trace edema present. No cyanosis. No clubbing. CNS: AxOx3, Cranial nerves grossly intact, moves all 4 extremities.  Skin: Warm and dry.   Intake/Output from previous day: No intake/output data recorded.    Lab Results: BMET    Component Value Date/Time   NA 143 02/07/2020 1441   NA 143 01/22/2020 0411   NA 142 01/21/2020 0424   K 4.4 02/07/2020 1441   K 4.4 01/22/2020 0411   K 3.4 (L) 01/21/2020 0424   CL 112 (H) 02/07/2020 1441   CL 108 01/22/2020 0411   CL 104 01/21/2020 0424   CO2 16 (L) 02/07/2020 1441   CO2 24 01/22/2020 0411   CO2 24 01/21/2020 0424   GLUCOSE 96 02/07/2020 1441   GLUCOSE 134 (H) 01/22/2020 0411   GLUCOSE 116 (H) 01/21/2020 0424   BUN 33 (H) 02/07/2020 1441   BUN 41 (H) 01/22/2020 0411   BUN 38 (H) 01/21/2020 0424   CREATININE 2.00 (H) 02/07/2020 1441   CREATININE 2.37 (H) 01/22/2020 0411   CREATININE 2.25 (H) 01/21/2020 0424    CALCIUM 9.5 02/07/2020 1441   CALCIUM 9.2 01/22/2020 0411   CALCIUM 9.1 01/21/2020 0424   GFRNONAA 26 (L) 02/07/2020 1441   GFRNONAA 21 (L) 01/22/2020 0411   GFRNONAA 22 (L) 01/21/2020 0424   GFRAA 30 (L) 02/07/2020 1441   GFRAA 24 (L) 01/22/2020 0411   GFRAA 26 (L) 01/21/2020 0424   CBC    Component Value Date/Time   WBC 7.4 02/07/2020 1441   RBC 3.48 (L) 02/07/2020 1441   HGB 10.1 (L) 02/07/2020 1441   HCT 32.9 (L) 02/07/2020 1441   PLT 315 02/07/2020 1441   MCV 94.5 02/07/2020 1441   MCH 29.0 02/07/2020 1441   MCHC 30.7 02/07/2020 1441   RDW 17.4 (H) 02/07/2020 1441   LYMPHSABS 1.0 01/19/2020 1250   MONOABS 0.6 01/19/2020 1250   EOSABS 0.1 01/19/2020 1250   BASOSABS 0.1 01/19/2020 1250   HEPATIC Function Panel Recent Labs    01/19/20 1250 01/19/20 1525 02/07/20 1542  PROT 6.5 6.7 7.0   HEMOGLOBIN A1C No components found for: HGA1C,  MPG CARDIAC ENZYMES Lab Results  Component Value Date   BJSEGBT 517 (H) 02/01/2018   BNP No results for input(s): PROBNP in the last 8760 hours. TSH Recent Labs    01/19/20 1250  TSH 2.243   CHOLESTEROL No results for input(s): CHOL in the last 8760 hours.  Scheduled  Meds: . allopurinol  100 mg Oral Daily  . ALPRAZolam  0.25 mg Oral TID  . [START ON 02/08/2020] aspirin EC  81 mg Oral Daily  . cholecalciferol  1,000 Units Oral Daily  . furosemide  40 mg Intravenous Daily  . metoprolol tartrate  12.5 mg Oral BID  . multivitamin with minerals  1 tablet Oral Daily  . oxymetazoline  1 spray Each Nare BID  . pantoprazole (PROTONIX) IV  40 mg Intravenous Q24H  . [START ON 02/08/2020] potassium chloride SA  20 mEq Oral Daily  . sertraline  100 mg Oral QHS  . sodium chloride flush  3 mL Intravenous Once  . tiZANidine  2 mg Oral QHS  . valACYclovir  500 mg Oral Daily   Continuous Infusions: . dextrose 5 % and 0.45% NaCl    . diltiazem (CARDIZEM) infusion 10 mg/hr (02/07/20 1714)  . heparin 800 Units/hr (02/07/20 2031)    PRN Meds:.acetaminophen, albuterol, nitroGLYCERIN, ondansetron (ZOFRAN) IV, ondansetron  Assessment/Plan: NSTEMI Acute on chronic diastolic left heart failure CKD, III HTN S/P CABG S/P Stroke Obesity Panic attack Anemia of chronic disease Abdominal pain Nausea without vomiting  Continue medical treatment. Add IV Protonix with IV heparin. Cardiac cath on Monday if tolerates IV heparin. Change to oral diltiazem in AM.   LOS: 0 days   Time spent including chart review, lab review, examination, discussion with patient : 70 min   Dixie Dials  MD  02/07/2020, 8:54 PM

## 2020-02-07 NOTE — ED Provider Notes (Signed)
[facility] EMERGENCY DEPARTMENT Provider Note   CSN: [id number] Arrival date & time: 02/07/20  1413     History No chief complaint on file.   Renee Griffin is a 64 y.o. female history of diabetes, atrial fibrillation, high cholesterol, CAD, hypertension, CVA, CHF, obesity.  Patient presents today for nausea and shortness of breath.  She reports that she has chronic shortness of breath, was recently admitted in January of this year for CHF exacerbation.  She reports that since discharge she has continued to feel mildly short of breath but feels this has begun to worsen over the past 1 week.  She reports that 2 days ago she developed nausea and due to this has been unable to take any of her home medications.  She reports that her shortness of breath has worsened over the past 2 days and contributes this to her nausea.  She describes shortness of breath as feeling of difficulty catching her breath after exertion.  She denies any fall/injury, fever/chills, headache, neck pain, chest pain, hemoptysis, abdominal pain, vomiting/diarrhea, dysuria/hematuria, extremity swelling/color change or any additional concerns. HPI     Past Medical History:  Diagnosis Date  . Depression   . Diabetes mellitus without complication ([place])   . Heart disease, congenital   . High cholesterol   . Hypertension   . Panic attack   . Stroke [facility])     Patient Active Problem List   Diagnosis Date Noted  . Acute systolic heart failure ([place]) 01/19/2020  . Acute lower GI bleeding 12/16/2018  . Dehydration 04/13/2018  . Ovarian mass, left   . Abdominal pain 02/01/2018  . Essential hypertension 03/04/2016  . HLD (hyperlipidemia) 03/04/2016  . Type 2 diabetes mellitus with circulatory disorder ([place]) 03/04/2016  . Obesity 03/04/2016  . CVA (cerebral vascular accident) ([place]) 03/05/2015  . Bilateral low back pain without sciatica 03/05/2015  . Essential hypertension, benign 03/05/2015  .  Tobacco use disorder 03/05/2015  . Hyperlipidemia 03/05/2015  . Hyperparathyroidism, primary ([place]) 02/13/2013  . Generalized ischemic cerebrovascular disease 02/06/2013  . Hyperreflexia 02/06/2013  . Abnormality of gait 02/06/2013  . Disturbance of skin sensation 02/06/2013  . CVA (cerebral infarction) 06/03/2012  . HTN (hypertension) 06/03/2012  . Dyslipidemia 06/03/2012  . Anxiety 06/03/2012    Past Surgical History:  Procedure Laterality Date  . CARDIAC SURGERY  2011   CABG  . COLONOSCOPY WITH PROPOFOL N/A 02/08/2018   Procedure: COLONOSCOPY WITH PROPOFOL;  Surgeon: Otis Brace, MD;  Location: [facility] ENDOSCOPY;  Service: Gastroenterology;  Laterality: N/A;  . COLONOSCOPY WITH PROPOFOL N/A 12/16/2018   Procedure: COLONOSCOPY WITH PROPOFOL;  Surgeon: Ronald Lobo, MD;  Location: [facility] ENDOSCOPY;  Service: Endoscopy;  Laterality: N/A;  . DILATION AND EVACUATION     Patient states she had an abortion 30 or 40 years ago  . ESOPHAGOGASTRODUODENOSCOPY (EGD) WITH PROPOFOL Left 02/05/2018   Procedure: ESOPHAGOGASTRODUODENOSCOPY (EGD) WITH PROPOFOL;  Surgeon: Otis Brace, MD;  Location: [facility] ENDOSCOPY;  Service: Gastroenterology;  Laterality: Left;  . IR ANGIOGRAM SELECTIVE EACH ADDITIONAL VESSEL  12/18/2018  . IR ANGIOGRAM SELECTIVE EACH ADDITIONAL VESSEL  12/18/2018  . IR ANGIOGRAM SELECTIVE EACH ADDITIONAL VESSEL  12/18/2018  . IR ANGIOGRAM SELECTIVE EACH ADDITIONAL VESSEL  12/18/2018  . IR ANGIOGRAM VISCERAL SELECTIVE  12/18/2018  . IR EMBO ART  VEN HEMORR LYMPH EXTRAV  INC GUIDE ROADMAPPING  12/18/2018  . IR US GUIDE VASC ACCESS RIGHT  12/18/2018  . WISDOM TOOTH EXTRACTION  OB History    Gravida  1   Para      Term      Preterm      AB  1   Living  0     SAB      TAB  1   Ectopic      Multiple      Live Births              Family History  Problem Relation Age of Onset  . Cancer Mother        uterian OR cervical  . Cancer Father        lung    . Leukemia Brother   . Cancer Sister        breast    Social History   Tobacco Use  . Smoking status: Current Every Day Smoker    Packs/day: 1.00    Years: 50.00    Pack years: 50.00    Types: Cigarettes  . Smokeless tobacco: Never Used  Substance Use Topics  . Alcohol use: No    Alcohol/week: 0.0 standard drinks  . Drug use: No    Home Medications Prior to Admission medications   Medication Sig Start Date End Date Taking? Authorizing Provider  ACCU-CHEK AVIVA PLUS test strip See admin instructions. 01/29/16   [provider]  ACCU-CHEK SOFTCLIX LANCETS lancets See admin instructions. 01/30/16   [provider]  acetaminophen (TYLENOL) 325 MG tablet Take 2 tablets (650 mg total) by mouth every 4 (four) hours as needed for headache or mild pain. 01/22/20   Dixie Dials, MD  albuterol (VENTOLIN HFA) 108 (90 Base) MCG/ACT inhaler Inhale 2 puffs into the lungs every 6 (six) hours as needed for wheezing or shortness of breath. 01/22/20   Dixie Dials, MD  allopurinol (ZYLOPRIM) 100 MG tablet Take 1 tablet (100 mg total) by mouth daily. 01/23/20   Dixie Dials, MD  ALPRAZolam Duanne Moron) 0.5 MG tablet Take 0.5 tablets (0.25 mg total) by mouth 2 (two) times daily as needed for anxiety. Patient taking differently: Take 0.25 mg by mouth 3 (three) times daily.  02/24/19   Julianne Rice, MD  apixaban (ELIQUIS) 2.5 MG TABS tablet Take 1 tablet (2.5 mg total) by mouth 2 (two) times daily. 01/22/20   Dixie Dials, MD  aspirin EC 81 MG tablet Take 1 tablet (81 mg total) by mouth daily. 01/22/20 01/21/21  Dixie Dials, MD  Blood Glucose Monitoring Suppl (ACCU-CHEK AVIVA PLUS) w/Device KIT See admin instructions. 01/29/16   [provider]  cholecalciferol (VITAMIN D3) 25 MCG (1000 UT) tablet Take 1,000 Units by mouth daily.    [provider]  diltiazem (CARDIZEM) 90 MG tablet Take 1 tablet (90 mg total) by mouth 2 (two) times daily. 02/09/18   Dixie Dials, MD   diphenhydrAMINE (BENADRYL) 50 MG tablet Take 50 mg by mouth at bedtime.    [provider]  furosemide (LASIX) 40 MG tablet Take 1 tablet (40 mg total) by mouth daily. 01/23/20   Dixie Dials, MD  gemfibrozil (LOPID) 600 MG tablet Take 600 mg by mouth 2 (two) times daily. 02/19/18   [provider]  loperamide (IMODIUM) 2 MG capsule Take 1 capsule (2 mg total) by mouth 4 (four) times daily as needed for diarrhea or loose stools. Patient taking differently: Take 2 mg by mouth daily as needed for diarrhea or loose stools.  02/24/19   Julianne Rice, MD  metoprolol tartrate (LOPRESSOR) 25 MG tablet Take 0.5  tablets (12.5 mg total) by mouth 2 (two) times daily. 12/25/18   Dixie Dials, MD  Multiple Vitamin (MULTIVITAMIN WITH MINERALS) TABS tablet Take 1 tablet by mouth daily.    [provider]  ondansetron (ZOFRAN) 4 MG tablet Take 1 tablet (4 mg total) by mouth every 6 (six) hours as needed for nausea or vomiting. 02/24/19   Julianne Rice, MD  oxymetazoline (AFRIN) 0.05 % nasal spray Place 1 spray into both nostrils 2 (two) times daily.    [provider]  pantoprazole (PROTONIX) 40 MG tablet Take 1 tablet (40 mg total) by mouth 2 (two) times daily. Patient taking differently: Take 40 mg by mouth 2 (two) times daily as needed (heartburn).  02/09/18   Dixie Dials, MD  potassium chloride SA (KLOR-CON) 20 MEQ tablet Take 1 tablet (20 mEq total) by mouth daily. 01/23/20   Dixie Dials, MD  prazosin (MINIPRESS) 1 MG capsule Take 1 capsule (1 mg total) by mouth 2 (two) times daily. 02/09/18   Dixie Dials, MD  sertraline (ZOLOFT) 100 MG tablet Take 100 mg by mouth at bedtime.     [provider]  tiZANidine (ZANAFLEX) 2 MG tablet Take 1 tablet (2 mg total) by mouth at bedtime as needed for muscle spasms. Patient taking differently: Take 2 mg by mouth at bedtime.  12/25/18   Dixie Dials, MD  valACYclovir (VALTREX) 500 MG tablet Take 500 mg by mouth daily.     [provider]    Allergies    Amoxicillin-pot clavulanate, Bupropion, and Nicotine  Review of Systems   Review of Systems Ten systems are reviewed and are negative for acute change except as noted in the HPI  Physical Exam Updated Vital Signs BP (!) 135/110   Pulse (!) 134   Temp 97.7 F (36.5 C) (Oral)   Resp (!) 21   SpO2 96%   Physical Exam Constitutional:      General: She is not in acute distress.    Appearance: Normal appearance. She is well-developed. She is obese. She is ill-appearing. She is not diaphoretic.  HENT:     Head: Normocephalic and atraumatic.     Right Ear: External ear normal.     Left Ear: External ear normal.     Nose: Nose normal.  Eyes:     General: Vision grossly intact. Gaze aligned appropriately.     Pupils: Pupils are equal, round, and reactive to light.  Neck:     Trachea: Trachea and phonation normal. No tracheal deviation.  Cardiovascular:     Rate and Rhythm: Tachycardia present. Rhythm irregular.     Pulses: Normal pulses.     Heart sounds: Normal heart sounds.  Pulmonary:     Effort: Pulmonary effort is normal. No respiratory distress.     Breath sounds: Normal breath sounds.  Abdominal:     General: There is no distension.     Palpations: Abdomen is soft.     Tenderness: There is no abdominal tenderness. There is no guarding or rebound.  Musculoskeletal:        General: Normal range of motion.     Cervical back: Normal range of motion.     Right lower leg: No edema.     Left lower leg: No edema.  Skin:    General: Skin is warm and dry.  Neurological:     Mental Status: She is alert.     GCS: GCS eye subscore is 4. GCS verbal subscore is 5. GCS motor  subscore is 6.     Comments: Speech is clear and goal oriented, follows commands Major Cranial nerves without deficit, no facial droop Moves extremities without ataxia, coordination intact  Psychiatric:        Behavior: Behavior normal.     ED Results / Procedures  / Treatments   Labs (all labs ordered are listed, but only abnormal results are displayed) Labs Reviewed  BASIC METABOLIC PANEL - Abnormal; Notable for the following components:      Result Value   Chloride 112 (*)    CO2 16 (*)    BUN 33 (*)    Creatinine, Ser 2.00 (*)    GFR calc non Af Amer 26 (*)    GFR calc Af Amer 30 (*)    All other components within normal limits  CBC - Abnormal; Notable for the following components:   RBC 3.48 (*)    Hemoglobin 10.1 (*)    HCT 32.9 (*)    RDW 17.4 (*)    nRBC 0.3 (*)    All other components within normal limits  URINALYSIS, ROUTINE W REFLEX MICROSCOPIC - Abnormal; Notable for the following components:   Protein, ur 30 (*)    Leukocytes,Ua SMALL (*)    Bacteria, UA RARE (*)    All other components within normal limits  RESPIRATORY PANEL BY RT PCR (FLU A&B, COVID)  BRAIN NATRIURETIC PEPTIDE  LIPASE, BLOOD  HEPATIC FUNCTION PANEL  TROPONIN I (HIGH SENSITIVITY)    EKG EKG Interpretation  Date/Time:  Friday February 07 2020 14:20:04 EST Ventricular Rate:  132 PR Interval:    QRS Duration: 80 QT Interval:  320 QTC Calculation: 474 R Axis:   74 Text Interpretation: Atrial fibrillation with rapid ventricular response Low voltage QRS Cannot rule out Anterior infarct , age undetermined ST & T wave abnormality, consider lateral ischemia Abnormal ECG similar when compared to prior Confirmed by Quintella Reichert [phone]) on 02/07/2020 3:24:26 PM   Radiology DG Chest 2 View  Result Date: 02/07/2020 CLINICAL DATA:  Tobacco abuse, hypertension, atrial fibrillation EXAM: CHEST - 2 VIEW COMPARISON:  01/21/2020 FINDINGS: Frontal and lateral views of the chest demonstrate a stable enlarged cardiac silhouette. Postsurgical changes are seen from median sternotomy. There is mild chronic vascular congestion without airspace disease, effusion, or pneumothorax. No acute bony abnormalities. IMPRESSION: 1. Stable mild chronic central vascular congestion.  Electronically Signed   By: Randa Ngo M.D.   On: 02/07/2020 14:59    Procedures .Critical Care Performed by: Deliah Boston, PA-C Authorized by: Deliah Boston, PA-C   Critical care provider statement:    Critical care time (minutes):  35   Critical care was necessary to treat or prevent imminent or life-threatening deterioration of the following conditions: A. fib RVR requiring load/infusion Cardizem.   Critical care was time spent personally by me on the following activities:  Discussions with consultants, evaluation of patient's response to treatment, examination of patient, ordering and performing treatments and interventions, ordering and review of laboratory studies, ordering and review of radiographic studies, pulse oximetry, re-evaluation of patient's condition, obtaining history from patient or surrogate, review of old charts and development of treatment plan with patient or surrogate   (including critical care time)  Medications Ordered in ED Medications  sodium chloride flush (NS) 0.9 % injection 3 mL (3 mLs Intravenous Not Given 02/07/20 1526)  diltiazem (CARDIZEM) 1 mg/mL load via infusion 15 mg (15 mg Intravenous Bolus from Bag 02/07/20 1634)    And  diltiazem (CARDIZEM) 125 mg in dextrose 5% 125 mL (1 mg/mL) infusion (5 mg/hr Intravenous New Bag/Given 02/07/20 1631)  ondansetron (ZOFRAN) injection 4 mg (4 mg Intravenous Given 02/07/20 1626)  metoprolol tartrate (LOPRESSOR) tablet 12.5 mg (12.5 mg Oral Given 02/07/20 1622)    ED Course  I have reviewed the triage vital signs and the nursing notes.  Pertinent labs & imaging results that were available during my care of the patient were reviewed by me and considered in my medical decision making (see chart for details).  Clinical Course as of Feb 06 1634  Fri Feb 07, 2020  1607 Dr. Doylene Canard   [BM]    Clinical Course User Index [BM] Gari Crown   MDM Rules/Calculators/A&P                     41  female with medical history as detailed above presents today for nausea x2 days and chronic shortness of breath worsening over the past several days.  She has not been able take any of her home medications for the past 2 days due to nausea.  She denies any complaints of pain.  On examination she is tired of healing, chronically ill and mildly tachypneic.  Cranial nerves intact, heart irregularly irregular and tachycardic, lungs clear bilaterally, abdomen soft nontender without peritoneal signs, neurovascular intact to all 4 extremities without evidence of DVT, does not appear to be fluid overloaded.  Will obtain basic shortness of breath lab work and abdominal labs, EKG, chest x-ray.  Additionally will add on BNP and Covid test.  She has been noncompliant with medications for the past 2 days due to nausea.  Unclear cause of nausea at this time.  Patient seen and evaluated by Dr. Ralene Bathe, will give patient Cardizem for A. fib RVR, home dose metoprolol and continue to monitor.  Plan to consult with patient's PCP Dr. Doylene Canard. - CBC appears baseline, hemoglobin 10.1 no significant drop prior, no leukocytosis to suggest infection BMP appears baseline as well with creatinine 2.0 and BUN of 33, no acute electrolyte abnormalities. Chest x-ray personally reviewed I agree with radiologist interpretation of vascular congestion. EKG reviewed with Dr. Ralene Bathe, no acute ischemic changes similar to prior - 4:07 PM: I discussed case with Dr. Doylene Canard who is on way to see patient and plans to admit to his service. - 4:30 PM: Patient reevaluated resting comfortably in bed remains tachycardic, Cardizem drip being started. She denies any pain. She states understanding of care plan and is agreeable for admission with Dr. Doylene Canard. Remainder of labs are pending. Care handoff was given to Janetta Hora, PA-C at shift change, plan of care is to follow-up on labs, reassess, anticipate admission with Dr. Doylene Canard.  CAMELA WICH was  evaluated in Emergency Department on 02/07/2020 for the symptoms described in the history of present illness. She was evaluated in the context of the global COVID-19 pandemic, which necessitated consideration that the patient might be at risk for infection with the SARS-CoV-2 virus that causes COVID-19. Institutional protocols and algorithms that pertain to the evaluation of patients at risk for COVID-19 are in a state of rapid change based on information released by regulatory bodies including the CDC and federal and state organizations. These policies and algorithms were followed during the patient's care in the ED.  Note: Portions of this report may have been transcribed using voice recognition software. Every effort was made to ensure accuracy; however, inadvertent computerized transcription errors may still be present.  Final Clinical Impression(s) / ED Diagnoses Final diagnoses:  Atrial fibrillation with RVR ([place])  Nausea    Rx / DC Orders ED Discharge Orders    None       Gari Crown 02/07/20 1635    Quintella Reichert, MD 02/08/20 1045

## 2020-02-07 NOTE — ED Notes (Signed)
Pt went to x-ray.

## 2020-02-07 NOTE — H&P (View-Only) (Signed)
Ref: Dixie Dials, MD   Subjective:  Awake. Breathing has improved post IV furosemide. BP is normalized.  Troponin I levels are high. Patient remains chest pain free. H/O GI bleed in past. Did not take Eliquis for 2 days.  Objective:  Vital Signs in the last 24 hours: Temp:  [96.7 F (35.9 C)-97.7 F (36.5 C)] 96.7 F (35.9 C) (02/12 2043) Pulse Rate:  [51-134] 88 (02/12 1925) Cardiac Rhythm: Atrial fibrillation (02/12 1654) Resp:  [16-25] 25 (02/12 1925) BP: (107-135)/(87-112) 107/92 (02/12 1925) SpO2:  [94 %-98 %] 98 % (02/12 1925)  Physical Exam: BP Readings from Last 1 Encounters:  02/07/20 (!) 107/92     Wt Readings from Last 1 Encounters:  01/22/20 74.9 kg    Weight change:  There is no height or weight on file to calculate BMI. HEENT: [place]/AT, Eyes-Blue, PERL, EOMI, Conjunctiva-Pink, Sclera-Non-icteric Neck: No JVD, No bruit, Trachea midline. Lungs:  Clearing, Bilateral. Cardiac:  Irregular rhythm, normal S1 and S2, no S3. II/VI systolic murmur. Abdomen:  Soft, non-tender. BS present. Extremities:  Trace edema present. No cyanosis. No clubbing. CNS: AxOx3, Cranial nerves grossly intact, moves all 4 extremities.  Skin: Warm and dry.   Intake/Output from previous day: No intake/output data recorded.    Lab Results: BMET    Component Value Date/Time   NA 143 02/07/2020 1441   NA 143 01/22/2020 0411   NA 142 01/21/2020 0424   K 4.4 02/07/2020 1441   K 4.4 01/22/2020 0411   K 3.4 (L) 01/21/2020 0424   CL 112 (H) 02/07/2020 1441   CL 108 01/22/2020 0411   CL 104 01/21/2020 0424   CO2 16 (L) 02/07/2020 1441   CO2 24 01/22/2020 0411   CO2 24 01/21/2020 0424   GLUCOSE 96 02/07/2020 1441   GLUCOSE 134 (H) 01/22/2020 0411   GLUCOSE 116 (H) 01/21/2020 0424   BUN 33 (H) 02/07/2020 1441   BUN 41 (H) 01/22/2020 0411   BUN 38 (H) 01/21/2020 0424   CREATININE 2.00 (H) 02/07/2020 1441   CREATININE 2.37 (H) 01/22/2020 0411   CREATININE 2.25 (H) 01/21/2020 0424    CALCIUM 9.5 02/07/2020 1441   CALCIUM 9.2 01/22/2020 0411   CALCIUM 9.1 01/21/2020 0424   GFRNONAA 26 (L) 02/07/2020 1441   GFRNONAA 21 (L) 01/22/2020 0411   GFRNONAA 22 (L) 01/21/2020 0424   GFRAA 30 (L) 02/07/2020 1441   GFRAA 24 (L) 01/22/2020 0411   GFRAA 26 (L) 01/21/2020 0424   CBC    Component Value Date/Time   WBC 7.4 02/07/2020 1441   RBC 3.48 (L) 02/07/2020 1441   HGB 10.1 (L) 02/07/2020 1441   HCT 32.9 (L) 02/07/2020 1441   PLT 315 02/07/2020 1441   MCV 94.5 02/07/2020 1441   MCH 29.0 02/07/2020 1441   MCHC 30.7 02/07/2020 1441   RDW 17.4 (H) 02/07/2020 1441   LYMPHSABS 1.0 01/19/2020 1250   MONOABS 0.6 01/19/2020 1250   EOSABS 0.1 01/19/2020 1250   BASOSABS 0.1 01/19/2020 1250   HEPATIC Function Panel Recent Labs    01/19/20 1250 01/19/20 1525 02/07/20 1542  PROT 6.5 6.7 7.0   HEMOGLOBIN A1C No components found for: HGA1C,  MPG CARDIAC ENZYMES Lab Results  Component Value Date   MVEHMCN 470 (H) 02/01/2018   BNP No results for input(s): PROBNP in the last 8760 hours. TSH Recent Labs    01/19/20 1250  TSH 2.243   CHOLESTEROL No results for input(s): CHOL in the last 8760 hours.  Scheduled  Meds: . allopurinol  100 mg Oral Daily  . ALPRAZolam  0.25 mg Oral TID  . [START ON 02/08/2020] aspirin EC  81 mg Oral Daily  . cholecalciferol  1,000 Units Oral Daily  . furosemide  40 mg Intravenous Daily  . metoprolol tartrate  12.5 mg Oral BID  . multivitamin with minerals  1 tablet Oral Daily  . oxymetazoline  1 spray Each Nare BID  . pantoprazole (PROTONIX) IV  40 mg Intravenous Q24H  . [START ON 02/08/2020] potassium chloride SA  20 mEq Oral Daily  . sertraline  100 mg Oral QHS  . sodium chloride flush  3 mL Intravenous Once  . tiZANidine  2 mg Oral QHS  . valACYclovir  500 mg Oral Daily   Continuous Infusions: . dextrose 5 % and 0.45% NaCl    . diltiazem (CARDIZEM) infusion 10 mg/hr (02/07/20 1714)  . heparin 800 Units/hr (02/07/20 2031)    PRN Meds:.acetaminophen, albuterol, nitroGLYCERIN, ondansetron (ZOFRAN) IV, ondansetron  Assessment/Plan: NSTEMI Acute on chronic diastolic left heart failure CKD, III HTN S/P CABG S/P Stroke Obesity Panic attack Anemia of chronic disease Abdominal pain Nausea without vomiting  Continue medical treatment. Add IV Protonix with IV heparin. Cardiac cath on Monday if tolerates IV heparin. Change to oral diltiazem in AM.   LOS: 0 days   Time spent including chart review, lab review, examination, discussion with patient : 70 min   Dixie Dials  MD  02/07/2020, 8:54 PM

## 2020-02-07 NOTE — Progress Notes (Signed)
ANTICOAGULATION CONSULT NOTE - Initial Consult  Pharmacy Consult for heparin IV Indication: chest pain/ACS  Allergies  Allergen Reactions  . Amoxicillin-Pot Clavulanate Diarrhea    Has patient had a PCN reaction causing immediate rash, facial/tongue/throat swelling, SOB or lightheadedness with hypotension: Unknown Has patient had a PCN reaction causing severe rash involving mucus membranes or skin necrosis:UnknoWN Has patient had a PCN reaction that required hospitalization: Unknown Has patient had a PCN reaction occurring within the last 10 years: Unknown If all of the above answers are "NO", then may proceed with Cephalosporin use.   . Bupropion Nausea Only  . Nicotine Nausea Only and Rash    Reaction to patches    Patient Measurements:   Heparin Dosing Weight: ~68 kg  Vital Signs: Temp: 97.7 F (36.5 C) (02/12 1418) Temp Source: Oral (02/12 1418) BP: 107/92 (02/12 1925) Pulse Rate: 88 (02/12 1925)  Labs: Recent Labs    02/07/20 1441 02/07/20 1542 02/07/20 1844  HGB 10.1*  --   --   HCT 32.9*  --   --   PLT 315  --   --   CREATININE 2.00*  --   --   TROPONINIHS  --  2,519* 2,907*    CrCl cannot be calculated (Unknown ideal weight.).   Medical History: Past Medical History:  Diagnosis Date  . Depression   . Diabetes mellitus without complication ([place])   . Heart disease, congenital   . High cholesterol   . Hypertension   . Panic attack   . Stroke [facility])     Medications:  (Not in a hospital admission)  Scheduled:  . allopurinol  100 mg Oral Daily  . ALPRAZolam  0.25 mg Oral TID  . [START ON 02/08/2020] aspirin EC  81 mg Oral Daily  . cholecalciferol  1,000 Units Oral Daily  . furosemide  40 mg Intravenous Daily  . metoprolol tartrate  12.5 mg Oral BID  . multivitamin with minerals  1 tablet Oral Daily  . oxymetazoline  1 spray Each Nare BID  . pantoprazole  40 mg Oral BID  . potassium chloride SA  20 mEq Oral Daily  . sertraline  100 mg Oral QHS   . sodium chloride flush  3 mL Intravenous Once  . tiZANidine  2 mg Oral QHS  . valACYclovir  500 mg Oral Daily   Infusions:  . dextrose 5 % and 0.45% NaCl    . diltiazem (CARDIZEM) infusion 10 mg/hr (02/07/20 1714)  . heparin      Assessment: 64 y/o female presenting to the ED with nausea for two days. PMH includes hypertension, type 2 DM, CKD, III, stroke, hyperlipidemia, tobacco use disorder, CABG, Bioprosthetic AV, atrial fibrillation, panic attack and obesity. Patient is taking Eliquis 2.5 mg twice daily for AF, last dose was on 2/10. Pharmacy has been consulted to dose heparin IV infusion for ACS.   Hgb 10.1, Plt 315. Scr elevated at 2. Since patient is taking Eliquis PTA and renal function is impaired, will avoid heparin IV bolus and monitor aPTT until aPTT and anti-Xa level correlate.    Goal of Therapy:  APTT 66-102 Heparin level 0.3-0.7 units/ml Monitor platelets by anticoagulation protocol: Yes   Plan:  - Start heparin 800 units/hr IV infusion - Check baseline aPTT and anti-Xa levels - Check anti-Xa level in ~6 hours - Monitor daily aPTT and anti-Xa levels and daily CBC - Monitor for s/sx of bleeding  Agnes Lawrence, PharmD PGY1 Pharmacy Resident

## 2020-02-07 NOTE — H&P (Signed)
Referring Physician: Doylene Canard, MD/ED Physian Renee Griffin is an 64 y.o. female.                       Chief Complaint: Nausea x 2 days.  HPI: 64 years old white female with PMH of hypertension, type 2 DM, CKD, III, stroke, hyperlipidemia, Tobacco use disorder, CABG, Bioprosthetic AV, atrial fibrillation, panic attack and obesity had nausea x 2 days. She was unable to take her medications. She also has shortness of breath. Her EKG showed atrial fibrillation with RVR. Her BNP is elevated at 2622.5 pg. and her HS-Troponin I is 2519 ng.  Her Lipase is normal and her abdominal x-ray is unremarkable. Her chest x-ray is shows stable vascular congestion.  EKG shows atrial fibrillation with RVR and possible lateral infarct, age undetermined. Patient denies chest pain.  Past Medical History:  Diagnosis Date  . Depression   . Diabetes mellitus without complication ([place])   . Heart disease, congenital   . High cholesterol   . Hypertension   . Panic attack   . Stroke [facility])       Past Surgical History:  Procedure Laterality Date  . CARDIAC SURGERY  2011   CABG  . COLONOSCOPY WITH PROPOFOL N/A 02/08/2018   Procedure: COLONOSCOPY WITH PROPOFOL;  Surgeon: Otis Brace, MD;  Location: [facility];  Service: Gastroenterology;  Laterality: N/A;  . COLONOSCOPY WITH PROPOFOL N/A 12/16/2018   Procedure: COLONOSCOPY WITH PROPOFOL;  Surgeon: Ronald Lobo, MD;  Location: [facility];  Service: Endoscopy;  Laterality: N/A;  . DILATION AND EVACUATION     Patient states she had an abortion 30 or 40 years ago  . ESOPHAGOGASTRODUODENOSCOPY (EGD) WITH PROPOFOL Left 02/05/2018   Procedure: ESOPHAGOGASTRODUODENOSCOPY (EGD) WITH PROPOFOL;  Surgeon: Otis Brace, MD;  Location: [facility];  Service: Gastroenterology;  Laterality: Left;  . IR ANGIOGRAM SELECTIVE EACH ADDITIONAL VESSEL  12/18/2018  . IR ANGIOGRAM SELECTIVE EACH ADDITIONAL VESSEL  12/18/2018  . IR ANGIOGRAM SELECTIVE EACH  ADDITIONAL VESSEL  12/18/2018  . IR ANGIOGRAM SELECTIVE EACH ADDITIONAL VESSEL  12/18/2018  . IR ANGIOGRAM VISCERAL SELECTIVE  12/18/2018  . IR EMBO ART  VEN HEMORR LYMPH EXTRAV  INC GUIDE ROADMAPPING  12/18/2018  . IR US GUIDE VASC ACCESS RIGHT  12/18/2018  . WISDOM TOOTH EXTRACTION      Family History  Problem Relation Age of Onset  . Cancer Mother        uterian OR cervical  . Cancer Father        lung  . Leukemia Brother   . Cancer Sister        breast   Social History:  reports that she has been smoking cigarettes. She has a 50.00 pack-year smoking history. She has never used smokeless tobacco. She reports that she does not drink alcohol or use drugs.  Allergies:  Allergies  Allergen Reactions  . Amoxicillin-Pot Clavulanate Diarrhea    Has patient had a PCN reaction causing immediate rash, facial/tongue/throat swelling, SOB or lightheadedness with hypotension: Unknown Has patient had a PCN reaction causing severe rash involving mucus membranes or skin necrosis:UnknoWN Has patient had a PCN reaction that required hospitalization: Unknown Has patient had a PCN reaction occurring within the last 10 years: Unknown If all of the above answers are "NO", then may proceed with Cephalosporin use.   . Bupropion Nausea Only  . Nicotine Nausea Only and Rash    Reaction to patches    (Not in a  hospital admission)   Results for orders placed or performed during the hospital encounter of 02/07/20 (from the past 48 hour(s))  Basic metabolic panel     Status: Abnormal   Collection Time: 02/07/20  2:41 PM  Result Value Ref Range   Sodium 143 135 - 145 mmol/L   Potassium 4.4 3.5 - 5.1 mmol/L   Chloride 112 (H) 98 - 111 mmol/L   CO2 16 (L) 22 - 32 mmol/L   Glucose, Bld 96 70 - 99 mg/dL   BUN 33 (H) 8 - 23 mg/dL   Creatinine, Ser 2.00 (H) 0.44 - 1.00 mg/dL   Calcium 9.5 8.9 - 10.3 mg/dL   GFR calc non Af Amer 26 (L) >60 mL/min   GFR calc Af Amer 30 (L) >60 mL/min   Anion gap 15 5  - 15    Comment: Performed at [facility], [address], [place], [place] [address]  CBC     Status: Abnormal   Collection Time: 02/07/20  2:41 PM  Result Value Ref Range   WBC 7.4 4.0 - 10.5 K/uL   RBC 3.48 (L) 3.87 - 5.11 MIL/uL   Hemoglobin 10.1 (L) 12.0 - 15.0 g/dL   HCT 32.9 (L) 36.0 - 46.0 %   MCV 94.5 80.0 - 100.0 fL   MCH 29.0 26.0 - 34.0 pg   MCHC 30.7 30.0 - 36.0 g/dL   RDW 17.4 (H) 11.5 - 15.5 %   Platelets 315 150 - 400 K/uL   nRBC 0.3 (H) 0.0 - 0.2 %    Comment: Performed at [place] [address] [place], [place] [address]  Brain natriuretic peptide     Status: Abnormal   Collection Time: 02/07/20  3:41 PM  Result Value Ref Range   B Natriuretic Peptide 2,622.5 (H) 0.0 - 100.0 pg/mL    Comment: Performed at [place] [address], [place], [place] [address]  Troponin I (High Sensitivity)     Status: Abnormal   Collection Time: 02/07/20  3:42 PM  Result Value Ref Range   Troponin I (High Sensitivity) 2,519 (HH) <18 ng/L    Comment: CRITICAL RESULT CALLED TO, READ BACK BY AND VERIFIED WITH: P JOHNSTON,RN 1646 02/07/2020 D BRADLEY (NOTE) Elevated high sensitivity troponin I (hsTnI) values and significant  changes across serial measurements may suggest ACS but many other  chronic and acute conditions are known to elevate hsTnI results.  Refer to the Links section for chest pain algorithms and additional  guidance. Performed at [facility], [place] [address], [place], [place]  [address]   Lipase, blood     Status: None   Collection Time: 02/07/20  3:42 PM  Result Value Ref Range   Lipase 14 11 - 51 U/L    Comment: Performed at [facility], [place] [address] [place],  [address]  Hepatic function panel     Status: Abnormal   Collection Time: 02/07/20  3:42 PM  Result Value Ref Range   Total Protein 7.0 6.5 - 8.1 g/dL   Albumin 3.5 3.5 - 5.0 g/dL   AST 29 15 - 41 U/L   ALT 14 0 - 44 U/L   Alkaline Phosphatase 160  (H) 38 - 126 U/L   Total Bilirubin 0.7 0.3 - 1.2 mg/dL   Bilirubin, Direct <0.1 0.0 - 0.2 mg/dL   Indirect Bilirubin NOT CALCULATED 0.3 - 0.9 mg/dL    Comment: Performed at [facility], [address], [place], [place] [address]  Urinalysis, Routine w reflex microscopic     Status: Abnormal   Collection Time: 02/07/20  3:42 PM  Result Value Ref Range   Color, Urine YELLOW YELLOW   APPearance CLEAR CLEAR   Specific Gravity, Urine 1.018 1.005 - 1.030   pH 5.0 5.0 - 8.0   Glucose, UA NEGATIVE NEGATIVE mg/dL   Hgb urine dipstick NEGATIVE NEGATIVE   Bilirubin Urine NEGATIVE NEGATIVE   Ketones, ur NEGATIVE NEGATIVE mg/dL   Protein, ur 30 (A) NEGATIVE mg/dL   Nitrite NEGATIVE NEGATIVE   Leukocytes,Ua SMALL (A) NEGATIVE   RBC / HPF 0-5 0 - 5 RBC/hpf   WBC, UA 11-20 0 - 5 WBC/hpf   Bacteria, UA RARE (A) NONE SEEN   Squamous Epithelial / LPF 0-5 0 - 5   Mucus PRESENT     Comment: Performed at [facility], [place] [address], [place], [place] [address]  Respiratory Panel by RT PCR (Flu A&B, Covid) - Nasopharyngeal Swab     Status: None   Collection Time: 02/07/20  4:38 PM   Specimen: Nasopharyngeal Swab  Result Value Ref Range   SARS Coronavirus 2 by RT PCR NEGATIVE NEGATIVE    Comment: (NOTE) SARS-CoV-2 target nucleic acids are NOT DETECTED. The SARS-CoV-2 RNA is generally detectable in upper respiratoy specimens during the acute phase of infection. The lowest concentration of SARS-CoV-2 viral copies this assay can detect is 131 copies/mL. A negative result does not preclude SARS-Cov-2 infection and should not be used as the sole basis for treatment or other patient management decisions. A negative result may occur with  improper specimen collection/handling, submission of specimen other than nasopharyngeal swab, presence of viral mutation(s) within the areas targeted by this assay, and inadequate number of viral copies (<131 copies/mL). A negative result must be combined  with clinical observations, patient history, and epidemiological information. The expected result is Negative. Fact Sheet for Patients:  PinkCheek.be Fact Sheet for Healthcare Providers:  GravelBags.it This test is not yet ap proved or cleared by the [place] FDA and  has been authorized for detection and/or diagnosis of SARS-CoV-2 by FDA under an Emergency Use Authorization (EUA). This EUA will remain  in effect (meaning this test can be used) for the duration of the COVID-19 declaration under Section 564(b)(1) of the Act, 21 U.S.C. section 360bbb-3(b)(1), unless the authorization is terminated or revoked sooner.    Influenza A by PCR NEGATIVE NEGATIVE   Influenza B by PCR NEGATIVE NEGATIVE    Comment: (NOTE) The Xpert Xpress SARS-CoV-2/FLU/RSV assay is intended as an aid in  the diagnosis of influenza from Nasopharyngeal swab specimens and  should not be used as a sole basis for treatment. Nasal washings and  aspirates are unacceptable for Xpert Xpress SARS-CoV-2/FLU/RSV  testing. Fact Sheet for Patients: PinkCheek.be Fact Sheet for Healthcare Providers: GravelBags.it This test is not yet approved or cleared by the [place] FDA and  has been authorized for detection and/or diagnosis of SARS-CoV-2 by  FDA under an Emergency Use Authorization (EUA). This EUA will remain  in effect (meaning this test can be used) for the duration of the  Covid-19 declaration under Section 564(b)(1) of the Act, 21  U.S.C. section 360bbb-3(b)(1), unless the authorization is  terminated or revoked. Performed at [facility], [place] [address], [place], [place] [address]    DG Chest 2 View  Result Date: 02/07/2020 CLINICAL DATA:  Tobacco abuse, hypertension, atrial fibrillation  EXAM: CHEST - 2 VIEW COMPARISON:  01/21/2020 FINDINGS: Frontal and lateral views of the chest  demonstrate a stable enlarged cardiac silhouette. Postsurgical changes are seen from median sternotomy. There is mild chronic vascular congestion without airspace disease, effusion, or pneumothorax. No acute bony abnormalities. IMPRESSION: 1. Stable mild chronic central vascular congestion. Electronically Signed   By: Randa Ngo M.D.   On: 02/07/2020 14:59   DG Abd 2 Views  Result Date: 02/07/2020 CLINICAL DATA:  Abdominal pain EXAM: ABDOMEN - 2 VIEW COMPARISON:  04/13/2018 FINDINGS: No evidence of bowel obstruction or free air. No organomegaly or suspicious calcification. Coils are seen in the right upper quadrant from prior arterial coiling. Degenerative changes in the lumbar spine. IMPRESSION: No acute findings. Electronically Signed   By: Rolm Baptise M.D.   On: 02/07/2020 18:14    Review Of Systems Constitutional: No fever, chills, Chronic weight gain. Eyes: No vision change, wears glasses. No discharge or pain. Ears: No hearing loss, No tinnitus. Respiratory: Positive asthma, COPD, pneumonias, shortness of breath. No hemoptysis. Cardiovascular: Positive chest pain, palpitation, leg edema. Gastrointestinal: Positive nausea, vomiting, diarrhea, constipation. No GI bleed. No hepatitis. Genitourinary: No dysuria, hematuria, kidney stone. No incontinance. CKD, III Neurological: Positiveo headache, stroke, no seizures.  Psychiatry: No psych facility admission for anxiety, depression, suicide. No detox. Skin: No rash. Musculoskeletal: Positive joint pain, fibromyalgia. Positive neck pain, back pain. Lymphadenopathy: No lymphadenopathy. Hematology: Positive anemia or easy bruising.   Blood pressure (!) 135/111, pulse 82, temperature 97.7 F (36.5 C), temperature source Oral, resp. rate (!) 21, SpO2 94 %. There is no height or weight on file to calculate BMI. General appearance: alert, cooperative, appears stated age and no distress Head: Normocephalic, atraumatic. Eyes: Blue eyes, Pale  pink conjunctiva, corneas clear. PERRL, EOM's intact. Neck: No adenopathy, no carotid bruit, no JVD, supple, symmetrical, trachea midline and thyroid not enlarged. Resp: Rhonchi to auscultation bilaterally. Cardio: Regular rate and rhythm, S1, S2 normal, II/VI systolic murmur, no click, rub or gallop GI: Soft, non-tender; bowel sounds normal; no organomegaly. Extremities: Trace edema, no cyanosis or clubbing. Skin: Warm and dry.  Neurologic: Alert and oriented X 3, normal strength.   Assessment/Plan NSTEMI, recent Acute diastolic left heart failure Abdominal pain CKD, III Hyperlipidemia Hypertension S/P prosthetic AV S/P CABG S/P Stroke, left cerebellar and pons area Obesity Panic attack  Admit. IV lasix IV diltiazem Home medications. Discussed need to do cardiac catheterization post stabilization. Currently unable to ly down flat.   Time spent: Review of old records, Lab, x-rays, EKG, other cardiac tests, examination, discussion with patient over 70 minutes.  [name], MD  02/07/2020, 6:18 PM

## 2020-02-07 NOTE — ED Triage Notes (Signed)
To ED via [facility] - c/o nausea x 2 days-- unable to take any of her medicine-- short of breath-- hx of Afib, not on home O2--

## 2020-02-08 ENCOUNTER — Inpatient Hospital Stay (HOSPITAL_COMMUNITY): Payer: Medicare Other

## 2020-02-08 LAB — BASIC METABOLIC PANEL
Anion gap: 15 (ref 5–15)
BUN: 41 mg/dL — ABNORMAL HIGH (ref 8–23)
CO2: 14 mmol/L — ABNORMAL LOW (ref 22–32)
Calcium: 9 mg/dL (ref 8.9–10.3)
Chloride: 112 mmol/L — ABNORMAL HIGH (ref 98–111)
Creatinine, Ser: 2.16 mg/dL — ABNORMAL HIGH (ref 0.44–1.00)
GFR calc Af Amer: 27 mL/min — ABNORMAL LOW (ref >60)
GFR calc non Af Amer: 23 mL/min — ABNORMAL LOW (ref >60)
Glucose, Bld: 106 mg/dL — ABNORMAL HIGH (ref 70–99)
Potassium: 4.2 mmol/L (ref 3.5–5.1)
Sodium: 141 mmol/L (ref 135–145)

## 2020-02-08 LAB — CBC
HCT: 30.9 % — ABNORMAL LOW (ref 36.0–46.0)
HCT: 29.8 % — ABNORMAL LOW (ref 36.0–46.0)
Hemoglobin: 9.4 g/dL — ABNORMAL LOW (ref 12.0–15.0)
Hemoglobin: 9.3 g/dL — ABNORMAL LOW (ref 12.0–15.0)
MCH: 29 pg (ref 26.0–34.0)
MCH: 29.7 pg (ref 26.0–34.0)
MCHC: 30.4 g/dL (ref 30.0–36.0)
MCHC: 31.2 g/dL (ref 30.0–36.0)
MCV: 95.4 fL (ref 80.0–100.0)
MCV: 95.2 fL (ref 80.0–100.0)
Platelets: 298 10*3/uL (ref 150–400)
Platelets: 318 10*3/uL (ref 150–400)
RBC: 3.24 MIL/uL — ABNORMAL LOW (ref 3.87–5.11)
RBC: 3.13 MIL/uL — ABNORMAL LOW (ref 3.87–5.11)
RDW: 17.5 % — ABNORMAL HIGH (ref 11.5–15.5)
RDW: 17.8 % — ABNORMAL HIGH (ref 11.5–15.5)
WBC: 6.1 10*3/uL (ref 4.0–10.5)
WBC: 6.8 10*3/uL (ref 4.0–10.5)
nRBC: 0.5 % — ABNORMAL HIGH (ref 0.0–0.2)
nRBC: 0.3 % — ABNORMAL HIGH (ref 0.0–0.2)

## 2020-02-08 LAB — HEMOGLOBIN A1C
Hgb A1c MFr Bld: 6.2 % — ABNORMAL HIGH (ref 4.8–5.6)
Mean Plasma Glucose: 131.24 mg/dL

## 2020-02-08 LAB — LIPID PANEL
Cholesterol: 202 mg/dL — ABNORMAL HIGH (ref 0–200)
HDL: 25 mg/dL — ABNORMAL LOW (ref >40)
LDL Cholesterol: 123 mg/dL — ABNORMAL HIGH (ref 0–99)
Total CHOL/HDL Ratio: 8.1 RATIO
Triglycerides: 269 mg/dL — ABNORMAL HIGH (ref <150)
VLDL: 54 mg/dL — ABNORMAL HIGH (ref 0–40)

## 2020-02-08 LAB — PROTIME-INR
INR: 1.6 — ABNORMAL HIGH (ref 0.8–1.2)
Prothrombin Time: 19 seconds — ABNORMAL HIGH (ref 11.4–15.2)

## 2020-02-08 LAB — GLUCOSE, CAPILLARY
Glucose-Capillary: 125 mg/dL — ABNORMAL HIGH (ref 70–99)
Glucose-Capillary: 94 mg/dL (ref 70–99)
Glucose-Capillary: 141 mg/dL — ABNORMAL HIGH (ref 70–99)

## 2020-02-08 LAB — HEPARIN LEVEL (UNFRACTIONATED)
Heparin Unfractionated: 0.15 IU/mL — ABNORMAL LOW (ref 0.30–0.70)
Heparin Unfractionated: 0.16 IU/mL — ABNORMAL LOW (ref 0.30–0.70)
Heparin Unfractionated: 0.35 IU/mL (ref 0.30–0.70)

## 2020-02-08 LAB — APTT: aPTT: 51 seconds — ABNORMAL HIGH (ref 24–36)

## 2020-02-08 MED ORDER — OXYCODONE-ACETAMINOPHEN 5-325 MG PO TABS
1.0000 | ORAL_TABLET | Freq: Once | ORAL | Status: AC
  Administered 2020-02-08: 1 via ORAL
  Filled 2020-02-08: qty 1

## 2020-02-08 MED ORDER — CHLORHEXIDINE GLUCONATE CLOTH 2 % EX PADS
6.0000 | MEDICATED_PAD | Freq: Every day | CUTANEOUS | Status: DC
  Administered 2020-02-08 – 2020-02-10 (×3): 6 via TOPICAL

## 2020-02-08 MED ORDER — METOPROLOL TARTRATE 25 MG PO TABS
25.0000 mg | ORAL_TABLET | Freq: Two times a day (BID) | ORAL | Status: DC
  Administered 2020-02-08 – 2020-02-14 (×13): 25 mg via ORAL
  Filled 2020-02-08 (×13): qty 1

## 2020-02-08 MED ORDER — "THROMBI-PAD 3""X3"" EX PADS"
1.0000 | MEDICATED_PAD | Freq: Once | CUTANEOUS | Status: AC
  Administered 2020-02-08: 1 via TOPICAL
  Filled 2020-02-08 (×2): qty 1

## 2020-02-08 MED ORDER — INSULIN ASPART 100 UNIT/ML ~~LOC~~ SOLN
0.0000 [IU] | Freq: Three times a day (TID) | SUBCUTANEOUS | Status: DC
  Administered 2020-02-09 – 2020-02-12 (×2): 1 [IU] via SUBCUTANEOUS

## 2020-02-08 NOTE — Progress Notes (Signed)
[place] for Heparin (Apixaban on hold) Indication: chest pain/ACS, Afib  Allergies  Allergen Reactions  . Amoxicillin-Pot Clavulanate Diarrhea    Has patient had a PCN reaction causing immediate rash, facial/tongue/throat swelling, SOB or lightheadedness with hypotension: Unknown Has patient had a PCN reaction causing severe rash involving mucus membranes or skin necrosis:UnknoWN Has patient had a PCN reaction that required hospitalization: Unknown Has patient had a PCN reaction occurring within the last 10 years: Unknown If all of the above answers are "NO", then may proceed with Cephalosporin use.   . Bupropion Nausea Only  . Nicotine Nausea Only and Rash    Reaction to patches    Patient Measurements: Weight: 171 lb 8.3 oz (77.8 kg) Heparin Dosing Weight: ~68 kg  Vital Signs: Temp: 96.1 F (35.6 C) (02/13 0000) Temp Source: Axillary (02/13 0000) BP: 113/73 (02/13 0400) Pulse Rate: 70 (02/13 0000)  Labs: Recent Labs    02/07/20 1441 02/07/20 1542 02/07/20 1844 02/07/20 2102 02/08/20 0257  HGB 10.1*  --   --   --  9.4*  HCT 32.9*  --   --   --  30.9*  PLT 315  --   --   --  298  APTT  --   --   --  44* 51*  LABPROT  --   --   --   --  19.0*  INR  --   --   --   --  1.6*  HEPARINUNFRC  --   --   --  0.14* 0.15*  CREATININE 2.00*  --   --   --  2.16*  TROPONINIHS  --  2,519* 2,907*  --   --     Estimated Creatinine Clearance: 26 mL/min (A) (by C-G formula based on SCr of 2.16 mg/dL (H)).   Medical History: Past Medical History:  Diagnosis Date  . Depression   . Diabetes mellitus without complication ([place])   . Heart disease, congenital   . High cholesterol   . Hypertension   . Panic attack   . Stroke [facility])     Medications:  Medications Prior to Admission  Medication Sig Dispense Refill Last Dose  . ACCU-CHEK AVIVA PLUS test strip See admin instructions.  0   . ACCU-CHEK SOFTCLIX LANCETS lancets See admin  instructions.  3   . acetaminophen (TYLENOL) 325 MG tablet Take 2 tablets (650 mg total) by mouth every 4 (four) hours as needed for headache or mild pain.   unknown  . albuterol (VENTOLIN HFA) 108 (90 Base) MCG/ACT inhaler Inhale 2 puffs into the lungs every 6 (six) hours as needed for wheezing or shortness of breath. 18 g 12 unknown  . allopurinol (ZYLOPRIM) 100 MG tablet Take 1 tablet (100 mg total) by mouth daily. 30 tablet 3 02/05/2020  . ALPRAZolam (XANAX) 0.5 MG tablet Take 0.5 tablets (0.25 mg total) by mouth 2 (two) times daily as needed for anxiety. (Patient taking differently: Take 0.25 mg by mouth 3 (three) times daily. ) 10 tablet 0 02/05/2020  . apixaban (ELIQUIS) 2.5 MG TABS tablet Take 1 tablet (2.5 mg total) by mouth 2 (two) times daily. 60 tablet 3 02/05/2020  . aspirin EC 81 MG tablet Take 1 tablet (81 mg total) by mouth daily. 90 tablet 3 02/05/2020  . Blood Glucose Monitoring Suppl (ACCU-CHEK AVIVA PLUS) w/Device KIT See admin instructions.  0   . cholecalciferol (VITAMIN D3) 25 MCG (1000 UT) tablet Take 1,000 Units by  mouth daily.   02/05/2020  . diltiazem (CARDIZEM) 90 MG tablet Take 1 tablet (90 mg total) by mouth 2 (two) times daily. 60 tablet 3 02/05/2020  . diphenhydrAMINE (BENADRYL) 50 MG tablet Take 50 mg by mouth at bedtime.   02/05/2020  . furosemide (LASIX) 40 MG tablet Take 1 tablet (40 mg total) by mouth daily. 30 tablet 3 02/05/2020  . gemfibrozil (LOPID) 600 MG tablet Take 600 mg by mouth 2 (two) times daily.  2 02/05/2020  . loperamide (IMODIUM) 2 MG capsule Take 1 capsule (2 mg total) by mouth 4 (four) times daily as needed for diarrhea or loose stools. (Patient taking differently: Take 2 mg by mouth daily as needed for diarrhea or loose stools. ) 12 capsule 0 02/05/2020  . metoprolol tartrate (LOPRESSOR) 25 MG tablet Take 0.5 tablets (12.5 mg total) by mouth 2 (two) times daily. 30 tablet 3 02/05/2020  . Multiple Vitamin (MULTIVITAMIN WITH MINERALS) TABS tablet Take 1  tablet by mouth daily.   02/05/2020  . ondansetron (ZOFRAN) 4 MG tablet Take 1 tablet (4 mg total) by mouth every 6 (six) hours as needed for nausea or vomiting. 12 tablet 0 02/05/2020  . oxymetazoline (AFRIN) 0.05 % nasal spray Place 1 spray into both nostrils 2 (two) times daily.   unknown  . pantoprazole (PROTONIX) 40 MG tablet Take 1 tablet (40 mg total) by mouth 2 (two) times daily. (Patient taking differently: Take 40 mg by mouth 2 (two) times daily as needed (heartburn). ) 60 tablet 6 02/05/2020  . potassium chloride SA (KLOR-CON) 20 MEQ tablet Take 1 tablet (20 mEq total) by mouth daily. 30 tablet 3 02/05/2020  . prazosin (MINIPRESS) 1 MG capsule Take 1 capsule (1 mg total) by mouth 2 (two) times daily. 60 capsule 3 02/05/2020  . sertraline (ZOLOFT) 100 MG tablet Take 100 mg by mouth at bedtime.    02/05/2020  . tiZANidine (ZANAFLEX) 2 MG tablet Take 1 tablet (2 mg total) by mouth at bedtime as needed for muscle spasms. (Patient taking differently: Take 2 mg by mouth at bedtime. ) 30 tablet 0 unknown  . valACYclovir (VALTREX) 500 MG tablet Take 500 mg by mouth daily.   02/05/2020   Scheduled:  . allopurinol  100 mg Oral Daily  . ALPRAZolam  0.25 mg Oral TID  . aspirin EC  81 mg Oral Daily  . Chlorhexidine Gluconate Cloth  6 each Topical Daily  . cholecalciferol  1,000 Units Oral Daily  . furosemide  40 mg Intravenous Daily  . metoprolol tartrate  12.5 mg Oral BID  . multivitamin with minerals  1 tablet Oral Daily  . oxymetazoline  1 spray Each Nare BID  . pantoprazole  40 mg Oral QHS  . potassium chloride SA  20 mEq Oral Daily  . sertraline  100 mg Oral QHS  . sodium chloride flush  3 mL Intravenous Once  . tiZANidine  2 mg Oral QHS  . valACYclovir  500 mg Oral Daily   Infusions:  . dextrose 5 % and 0.45% NaCl    . diltiazem (CARDIZEM) infusion 10 mg/hr (02/08/20 0409)  . heparin 800 Units/hr (02/07/20 2031)    Assessment: 64 y/o female presenting to the ED with nausea for two  days. PMH includes hypertension, type 2 DM, CKD, III, stroke, hyperlipidemia, tobacco use disorder, CABG, Bioprosthetic AV, atrial fibrillation, panic attack and obesity. Patient is taking Eliquis 2.5 mg twice daily for AF, last dose was on 2/10. Pharmacy has been consulted  to dose heparin IV infusion for ACS.   Hgb 10.1, Plt 315. Scr elevated at 2. Since patient is taking Eliquis PTA and renal function is impaired, will avoid heparin IV bolus and monitor aPTT until aPTT and anti-Xa level correlate.   2/13 AM update: Heparin level low APTT/HL correlate-will DC aPTT monitoring No issues per RN   Goal of Therapy:  APTT 66-102 Heparin level 0.3-0.7 units/ml Monitor platelets by anticoagulation protocol: Yes   Plan:  - Inc heparin to 1000 units/hr - Check anti-Xa level in ~6-8 hours - anti-Xa and aPTT correlate-will DC aPTT monitoring - Monitor daily anti-Xa levels and daily CBC - Monitor for s/sx of bleeding  Narda Bonds, PharmD, BCPS Clinical Pharmacist Phone: [phone]

## 2020-02-08 NOTE — Progress Notes (Signed)
Subjective:  Patient denies any chest pain states breathing has improved.  Nausea resolved.  Objective:  Vital Signs in the last 24 hours: Temp:  [96.1 F (35.6 C)-98.3 F (36.8 C)] 98.3 F (36.8 C) (02/13 0750) Pulse Rate:  [51-134] 70 (02/13 0400) Resp:  [13-33] 17 (02/13 0800) BP: (81-135)/(65-112) 125/82 (02/13 0800) SpO2:  [80 %-100 %] 98 % (02/13 0800) Weight:  [77.8 kg] 77.8 kg (02/13 0209)  Intake/Output from previous day: 02/12 0701 - 02/13 0700 In: 568.6 [I.V.:568.6] Out: 500 [Urine:500] Intake/Output from this shift: Total I/O In: 130 [P.O.:120; I.V.:10] Out: -   Physical Exam: Neck: no adenopathy, no carotid bruit, no JVD and supple, symmetrical, trachea midline Lungs: clear to auscultation bilaterally Heart: irregularly irregular rhythm, S1, S2 normal and 2/6 systolic murmur noted Abdomen: soft, non-tender; bowel sounds normal; no masses,  no organomegaly Extremities: extremities normal, atraumatic, no cyanosis or edema  Lab Results: Recent Labs    02/07/20 1441 02/08/20 0257  WBC 7.4 6.1  HGB 10.1* 9.4*  PLT 315 298   Recent Labs    02/07/20 1441 02/08/20 0257  NA 143 141  K 4.4 4.2  CL 112* 112*  CO2 16* 14*  GLUCOSE 96 106*  BUN 33* 41*  CREATININE 2.00* 2.16*   No results for input(s): TROPONINI in the last 72 hours.  Invalid input(s): CK, MB Hepatic Function Panel Recent Labs    02/07/20 1542  PROT 7.0  ALBUMIN 3.5  AST 29  ALT 14  ALKPHOS 160*  BILITOT 0.7  BILIDIR <0.1  IBILI NOT CALCULATED   Recent Labs    02/08/20 0257  CHOL 202*   No results for input(s): PROTIME in the last 72 hours.  Imaging: Imaging results have been reviewed and DG Chest 2 View  Result Date: 02/07/2020 CLINICAL DATA:  Tobacco abuse, hypertension, atrial fibrillation EXAM: CHEST - 2 VIEW COMPARISON:  01/21/2020 FINDINGS: Frontal and lateral views of the chest demonstrate a stable enlarged cardiac silhouette. Postsurgical changes are seen from  median sternotomy. There is mild chronic vascular congestion without airspace disease, effusion, or pneumothorax. No acute bony abnormalities. IMPRESSION: 1. Stable mild chronic central vascular congestion. Electronically Signed   By: Randa Ngo M.D.   On: 02/07/2020 14:59   DG Abd 2 Views  Result Date: 02/07/2020 CLINICAL DATA:  Abdominal pain EXAM: ABDOMEN - 2 VIEW COMPARISON:  04/13/2018 FINDINGS: No evidence of bowel obstruction or free air. No organomegaly or suspicious calcification. Coils are seen in the right upper quadrant from prior arterial coiling. Degenerative changes in the lumbar spine. IMPRESSION: No acute findings. Electronically Signed   By: Rolm Baptise M.D.   On: 02/07/2020 18:14    Cardiac Studies:  Assessment/Plan:  Status post small NSTEMI with atypical presentation Acute on chronic diastolic left heart failure CKD, III HTN S/P CABG S/P Stroke Obesity Panic attack Anemia of chronic disease Plan Increase metoprolol tartrate to 25 mg twice daily Wean off Cardizem drip as tolerated Sliding scale insulin as per orders Possible cardiac catheterization Monday per Dr. Doylene Canard  LOS: 1 day    Charolette Forward 02/08/2020, 9:26 AM

## 2020-02-08 NOTE — Progress Notes (Signed)
[place] for Heparin (Apixaban on hold) Indication: chest pain/ACS, Afib  Allergies  Allergen Reactions  . Amoxicillin-Pot Clavulanate Diarrhea    Has patient had a PCN reaction causing immediate rash, facial/tongue/throat swelling, SOB or lightheadedness with hypotension: Unknown Has patient had a PCN reaction causing severe rash involving mucus membranes or skin necrosis:UnknoWN Has patient had a PCN reaction that required hospitalization: Unknown Has patient had a PCN reaction occurring within the last 10 years: Unknown If all of the above answers are "NO", then may proceed with Cephalosporin use.   . Bupropion Nausea Only  . Nicotine Nausea Only and Rash    Reaction to patches    Patient Measurements: Weight: 171 lb 8.3 oz (77.8 kg) Heparin Dosing Weight: ~68 kg  Vital Signs: Temp: 97.6 F (36.4 C) (02/13 2000) Temp Source: Oral (02/13 2000) BP: 109/83 (02/13 2000) Pulse Rate: 79 (02/13 0949)  Labs: Recent Labs     0000 02/07/20 1441 02/07/20 1542 02/07/20 1844 02/07/20 2102 02/07/20 2102 02/08/20 0257 02/08/20 1213 02/08/20 2047  HGB   < > 10.1*  --   --   --   --  9.4*  --  9.3*  HCT  --  32.9*  --   --   --   --  30.9*  --  29.8*  PLT  --  315  --   --   --   --  298  --  318  APTT  --   --   --   --  44*  --  51*  --   --   LABPROT  --   --   --   --   --   --  19.0*  --   --   INR  --   --   --   --   --   --  1.6*  --   --   HEPARINUNFRC  --   --   --   --  0.14*   < > 0.15* 0.16* 0.35  CREATININE  --  2.00*  --   --   --   --  2.16*  --   --   TROPONINIHS  --   --  2,519* 2,907*  --   --   --   --   --    < > = values in this interval not displayed.    Estimated Creatinine Clearance: 26 mL/min (A) (by C-G formula based on SCr of 2.16 mg/dL (H)).   Medical History: Past Medical History:  Diagnosis Date  . Depression   . Diabetes mellitus without complication ([place])   . Heart disease, congenital   . High  cholesterol   . Hypertension   . Panic attack   . Stroke [facility])     Medications:  Medications Prior to Admission  Medication Sig Dispense Refill Last Dose  . ACCU-CHEK AVIVA PLUS test strip See admin instructions.  0   . ACCU-CHEK SOFTCLIX LANCETS lancets See admin instructions.  3   . acetaminophen (TYLENOL) 325 MG tablet Take 2 tablets (650 mg total) by mouth every 4 (four) hours as needed for headache or mild pain.   unknown  . albuterol (VENTOLIN HFA) 108 (90 Base) MCG/ACT inhaler Inhale 2 puffs into the lungs every 6 (six) hours as needed for wheezing or shortness of breath. 18 g 12 unknown  . allopurinol (ZYLOPRIM) 100 MG tablet Take 1 tablet (100 mg total) by mouth daily.  30 tablet 3 02/05/2020  . ALPRAZolam (XANAX) 0.5 MG tablet Take 0.5 tablets (0.25 mg total) by mouth 2 (two) times daily as needed for anxiety. (Patient taking differently: Take 0.25 mg by mouth 3 (three) times daily. ) 10 tablet 0 02/05/2020  . apixaban (ELIQUIS) 2.5 MG TABS tablet Take 1 tablet (2.5 mg total) by mouth 2 (two) times daily. 60 tablet 3 02/05/2020  . aspirin EC 81 MG tablet Take 1 tablet (81 mg total) by mouth daily. 90 tablet 3 02/05/2020  . Blood Glucose Monitoring Suppl (ACCU-CHEK AVIVA PLUS) w/Device KIT See admin instructions.  0   . cholecalciferol (VITAMIN D3) 25 MCG (1000 UT) tablet Take 1,000 Units by mouth daily.   02/05/2020  . diltiazem (CARDIZEM) 90 MG tablet Take 1 tablet (90 mg total) by mouth 2 (two) times daily. 60 tablet 3 02/05/2020  . diphenhydrAMINE (BENADRYL) 50 MG tablet Take 50 mg by mouth at bedtime.   02/05/2020  . furosemide (LASIX) 40 MG tablet Take 1 tablet (40 mg total) by mouth daily. 30 tablet 3 02/05/2020  . gemfibrozil (LOPID) 600 MG tablet Take 600 mg by mouth 2 (two) times daily.  2 02/05/2020  . loperamide (IMODIUM) 2 MG capsule Take 1 capsule (2 mg total) by mouth 4 (four) times daily as needed for diarrhea or loose stools. (Patient taking differently: Take 2 mg by mouth  daily as needed for diarrhea or loose stools. ) 12 capsule 0 02/05/2020  . metoprolol tartrate (LOPRESSOR) 25 MG tablet Take 0.5 tablets (12.5 mg total) by mouth 2 (two) times daily. 30 tablet 3 02/05/2020  . Multiple Vitamin (MULTIVITAMIN WITH MINERALS) TABS tablet Take 1 tablet by mouth daily.   02/05/2020  . ondansetron (ZOFRAN) 4 MG tablet Take 1 tablet (4 mg total) by mouth every 6 (six) hours as needed for nausea or vomiting. 12 tablet 0 02/05/2020  . oxymetazoline (AFRIN) 0.05 % nasal spray Place 1 spray into both nostrils 2 (two) times daily.   unknown  . pantoprazole (PROTONIX) 40 MG tablet Take 1 tablet (40 mg total) by mouth 2 (two) times daily. (Patient taking differently: Take 40 mg by mouth 2 (two) times daily as needed (heartburn). ) 60 tablet 6 02/05/2020  . potassium chloride SA (KLOR-CON) 20 MEQ tablet Take 1 tablet (20 mEq total) by mouth daily. 30 tablet 3 02/05/2020  . prazosin (MINIPRESS) 1 MG capsule Take 1 capsule (1 mg total) by mouth 2 (two) times daily. 60 capsule 3 02/05/2020  . sertraline (ZOLOFT) 100 MG tablet Take 100 mg by mouth at bedtime.    02/05/2020  . tiZANidine (ZANAFLEX) 2 MG tablet Take 1 tablet (2 mg total) by mouth at bedtime as needed for muscle spasms. (Patient taking differently: Take 2 mg by mouth at bedtime. ) 30 tablet 0 unknown  . valACYclovir (VALTREX) 500 MG tablet Take 500 mg by mouth daily.   02/05/2020   Scheduled:  . allopurinol  100 mg Oral Daily  . ALPRAZolam  0.25 mg Oral TID  . aspirin EC  81 mg Oral Daily  . Chlorhexidine Gluconate Cloth  6 each Topical Daily  . cholecalciferol  1,000 Units Oral Daily  . furosemide  40 mg Intravenous Daily  . insulin aspart  0-6 Units Subcutaneous TID WC  . metoprolol tartrate  25 mg Oral BID  . multivitamin with minerals  1 tablet Oral Daily  . oxyCODONE-acetaminophen  1 tablet Oral Once  . oxymetazoline  1 spray Each Nare BID  .  pantoprazole  40 mg Oral QHS  . potassium chloride SA  20 mEq Oral Daily  .  sertraline  100 mg Oral QHS  . sodium chloride flush  3 mL Intravenous Once  . tiZANidine  2 mg Oral QHS  . valACYclovir  500 mg Oral Daily   Infusions:  . dextrose 5 % and 0.45% NaCl    . diltiazem (CARDIZEM) infusion 10 mg/hr (02/08/20 1800)  . heparin 1,200 Units/hr (02/08/20 1800)    Assessment: 64 y/o female presenting to the ED with nausea for two days. PMH includes hypertension, type 2 DM, CKD, III, stroke, hyperlipidemia, tobacco use disorder, CABG, Bioprosthetic AV, atrial fibrillation, panic attack and obesity. Patient is taking Eliquis 2.5 mg twice daily for AF, last dose was on 2/10. Pharmacy has been consulted to dose heparin IV infusion for ACS.   Heparin level now therapeutic at 0.35. Bleeding noted earlier now resolved and CBC stable on recheck.   Goal of Therapy:  APTT 66-102 Heparin level 0.3-0.7 units/ml Monitor platelets by anticoagulation protocol: Yes   Plan:  -Continue heparin 1200 units/h -Recheck with morning labs   Arrie Senate, PharmD, BCPS Clinical Pharmacist [phone] Please check AMION for all [place] numbers 02/08/2020

## 2020-02-08 NOTE — Progress Notes (Signed)
Echocardiogram 2D Echocardiogram has been performed.  Renee Griffin 02/08/2020, 8:33 AM

## 2020-02-08 NOTE — Progress Notes (Addendum)
At approximately 1600 patient's  left AC IV began to bleed. IV removed and pressure applied. Bleeding continued, thrombin pad ordered and placed, gauze, pressure tape and pressure applied. Bleeding stopped at 1655.

## 2020-02-08 NOTE — Progress Notes (Signed)
[place] for Heparin (Apixaban on hold) Indication: chest pain/ACS, Afib  Allergies  Allergen Reactions  . Amoxicillin-Pot Clavulanate Diarrhea    Has patient had a PCN reaction causing immediate rash, facial/tongue/throat swelling, SOB or lightheadedness with hypotension: Unknown Has patient had a PCN reaction causing severe rash involving mucus membranes or skin necrosis:UnknoWN Has patient had a PCN reaction that required hospitalization: Unknown Has patient had a PCN reaction occurring within the last 10 years: Unknown If all of the above answers are "NO", then may proceed with Cephalosporin use.   . Bupropion Nausea Only  . Nicotine Nausea Only and Rash    Reaction to patches    Patient Measurements: Weight: 171 lb 8.3 oz (77.8 kg) Heparin Dosing Weight: ~68 kg  Vital Signs: Temp: 98.5 F (36.9 C) (02/13 1130) Temp Source: Oral (02/13 1130) BP: 102/60 (02/13 1400) Pulse Rate: 79 (02/13 0949)  Labs: Recent Labs    02/07/20 1441 02/07/20 1542 02/07/20 1844 02/07/20 2102 02/08/20 0257 02/08/20 1213  HGB 10.1*  --   --   --  9.4*  --   HCT 32.9*  --   --   --  30.9*  --   PLT 315  --   --   --  298  --   APTT  --   --   --  44* 51*  --   LABPROT  --   --   --   --  19.0*  --   INR  --   --   --   --  1.6*  --   HEPARINUNFRC  --   --   --  0.14* 0.15* 0.16*  CREATININE 2.00*  --   --   --  2.16*  --   TROPONINIHS  --  2,519* 2,907*  --   --   --     Estimated Creatinine Clearance: 26 mL/min (A) (by C-G formula based on SCr of 2.16 mg/dL (H)).   Medical History: Past Medical History:  Diagnosis Date  . Depression   . Diabetes mellitus without complication ([place])   . Heart disease, congenital   . High cholesterol   . Hypertension   . Panic attack   . Stroke [facility])     Medications:  Medications Prior to Admission  Medication Sig Dispense Refill Last Dose  . ACCU-CHEK AVIVA PLUS test strip See admin instructions.  0    . ACCU-CHEK SOFTCLIX LANCETS lancets See admin instructions.  3   . acetaminophen (TYLENOL) 325 MG tablet Take 2 tablets (650 mg total) by mouth every 4 (four) hours as needed for headache or mild pain.   unknown  . albuterol (VENTOLIN HFA) 108 (90 Base) MCG/ACT inhaler Inhale 2 puffs into the lungs every 6 (six) hours as needed for wheezing or shortness of breath. 18 g 12 unknown  . allopurinol (ZYLOPRIM) 100 MG tablet Take 1 tablet (100 mg total) by mouth daily. 30 tablet 3 02/05/2020  . ALPRAZolam (XANAX) 0.5 MG tablet Take 0.5 tablets (0.25 mg total) by mouth 2 (two) times daily as needed for anxiety. (Patient taking differently: Take 0.25 mg by mouth 3 (three) times daily. ) 10 tablet 0 02/05/2020  . apixaban (ELIQUIS) 2.5 MG TABS tablet Take 1 tablet (2.5 mg total) by mouth 2 (two) times daily. 60 tablet 3 02/05/2020  . aspirin EC 81 MG tablet Take 1 tablet (81 mg total) by mouth daily. 90 tablet 3 02/05/2020  . Blood Glucose  Monitoring Suppl (ACCU-CHEK AVIVA PLUS) w/Device KIT See admin instructions.  0   . cholecalciferol (VITAMIN D3) 25 MCG (1000 UT) tablet Take 1,000 Units by mouth daily.   02/05/2020  . diltiazem (CARDIZEM) 90 MG tablet Take 1 tablet (90 mg total) by mouth 2 (two) times daily. 60 tablet 3 02/05/2020  . diphenhydrAMINE (BENADRYL) 50 MG tablet Take 50 mg by mouth at bedtime.   02/05/2020  . furosemide (LASIX) 40 MG tablet Take 1 tablet (40 mg total) by mouth daily. 30 tablet 3 02/05/2020  . gemfibrozil (LOPID) 600 MG tablet Take 600 mg by mouth 2 (two) times daily.  2 02/05/2020  . loperamide (IMODIUM) 2 MG capsule Take 1 capsule (2 mg total) by mouth 4 (four) times daily as needed for diarrhea or loose stools. (Patient taking differently: Take 2 mg by mouth daily as needed for diarrhea or loose stools. ) 12 capsule 0 02/05/2020  . metoprolol tartrate (LOPRESSOR) 25 MG tablet Take 0.5 tablets (12.5 mg total) by mouth 2 (two) times daily. 30 tablet 3 02/05/2020  . Multiple Vitamin  (MULTIVITAMIN WITH MINERALS) TABS tablet Take 1 tablet by mouth daily.   02/05/2020  . ondansetron (ZOFRAN) 4 MG tablet Take 1 tablet (4 mg total) by mouth every 6 (six) hours as needed for nausea or vomiting. 12 tablet 0 02/05/2020  . oxymetazoline (AFRIN) 0.05 % nasal spray Place 1 spray into both nostrils 2 (two) times daily.   unknown  . pantoprazole (PROTONIX) 40 MG tablet Take 1 tablet (40 mg total) by mouth 2 (two) times daily. (Patient taking differently: Take 40 mg by mouth 2 (two) times daily as needed (heartburn). ) 60 tablet 6 02/05/2020  . potassium chloride SA (KLOR-CON) 20 MEQ tablet Take 1 tablet (20 mEq total) by mouth daily. 30 tablet 3 02/05/2020  . prazosin (MINIPRESS) 1 MG capsule Take 1 capsule (1 mg total) by mouth 2 (two) times daily. 60 capsule 3 02/05/2020  . sertraline (ZOLOFT) 100 MG tablet Take 100 mg by mouth at bedtime.    02/05/2020  . tiZANidine (ZANAFLEX) 2 MG tablet Take 1 tablet (2 mg total) by mouth at bedtime as needed for muscle spasms. (Patient taking differently: Take 2 mg by mouth at bedtime. ) 30 tablet 0 unknown  . valACYclovir (VALTREX) 500 MG tablet Take 500 mg by mouth daily.   02/05/2020   Scheduled:  . allopurinol  100 mg Oral Daily  . ALPRAZolam  0.25 mg Oral TID  . aspirin EC  81 mg Oral Daily  . Chlorhexidine Gluconate Cloth  6 each Topical Daily  . cholecalciferol  1,000 Units Oral Daily  . furosemide  40 mg Intravenous Daily  . insulin aspart  0-6 Units Subcutaneous TID WC  . metoprolol tartrate  25 mg Oral BID  . multivitamin with minerals  1 tablet Oral Daily  . oxymetazoline  1 spray Each Nare BID  . pantoprazole  40 mg Oral QHS  . potassium chloride SA  20 mEq Oral Daily  . sertraline  100 mg Oral QHS  . sodium chloride flush  3 mL Intravenous Once  . tiZANidine  2 mg Oral QHS  . valACYclovir  500 mg Oral Daily   Infusions:  . dextrose 5 % and 0.45% NaCl    . diltiazem (CARDIZEM) infusion 10 mg/hr (02/08/20 1400)  . heparin 1,200  Units/hr (02/08/20 1437)    Assessment: 64 y/o female presenting to the ED with nausea for two days. PMH includes hypertension, type  2 DM, CKD, III, stroke, hyperlipidemia, tobacco use disorder, CABG, Bioprosthetic AV, atrial fibrillation, panic attack and obesity. Patient is taking Eliquis 2.5 mg twice daily for AF, last dose was on 2/10. Pharmacy has been consulted to dose heparin IV infusion for ACS.   Heparin drip 1000 uts/hr HL 0.16 <goal (aptt and HL correlating will use HL to dose heparin moving forward) H/h low stable, pltc stable.     Goal of Therapy:  APTT 66-102 Heparin level 0.3-0.7 units/ml Monitor platelets by anticoagulation protocol: Yes   Plan:  - Inc heparin to 1200 units/hr - Check HL in 6hr  - Monitor daily anti-Xa levels and daily CBC - Monitor for s/sx of bleeding   Bonnita Nasuti Pharm.D. CPP, BCPS Clinical Pharmacist [phone] 02/08/2020 3:19 PM

## 2020-02-09 LAB — BASIC METABOLIC PANEL
Anion gap: 12 (ref 5–15)
BUN: 48 mg/dL — ABNORMAL HIGH (ref 8–23)
CO2: 17 mmol/L — ABNORMAL LOW (ref 22–32)
Calcium: 8.6 mg/dL — ABNORMAL LOW (ref 8.9–10.3)
Chloride: 110 mmol/L (ref 98–111)
Creatinine, Ser: 2.34 mg/dL — ABNORMAL HIGH (ref 0.44–1.00)
GFR calc Af Amer: 25 mL/min — ABNORMAL LOW (ref >60)
GFR calc non Af Amer: 21 mL/min — ABNORMAL LOW (ref >60)
Glucose, Bld: 114 mg/dL — ABNORMAL HIGH (ref 70–99)
Potassium: 4.3 mmol/L (ref 3.5–5.1)
Sodium: 139 mmol/L (ref 135–145)

## 2020-02-09 LAB — APTT: aPTT: 88 seconds — ABNORMAL HIGH (ref 24–36)

## 2020-02-09 LAB — CBC
HCT: 28 % — ABNORMAL LOW (ref 36.0–46.0)
Hemoglobin: 8.6 g/dL — ABNORMAL LOW (ref 12.0–15.0)
MCH: 29.3 pg (ref 26.0–34.0)
MCHC: 30.7 g/dL (ref 30.0–36.0)
MCV: 95.2 fL (ref 80.0–100.0)
Platelets: 282 10*3/uL (ref 150–400)
RBC: 2.94 MIL/uL — ABNORMAL LOW (ref 3.87–5.11)
RDW: 17.6 % — ABNORMAL HIGH (ref 11.5–15.5)
WBC: 6.3 10*3/uL (ref 4.0–10.5)
nRBC: 0.5 % — ABNORMAL HIGH (ref 0.0–0.2)

## 2020-02-09 LAB — GLUCOSE, CAPILLARY
Glucose-Capillary: 102 mg/dL — ABNORMAL HIGH (ref 70–99)
Glucose-Capillary: 107 mg/dL — ABNORMAL HIGH (ref 70–99)
Glucose-Capillary: 158 mg/dL — ABNORMAL HIGH (ref 70–99)
Glucose-Capillary: 172 mg/dL — ABNORMAL HIGH (ref 70–99)

## 2020-02-09 LAB — HEPARIN LEVEL (UNFRACTIONATED): Heparin Unfractionated: 0.41 IU/mL (ref 0.30–0.70)

## 2020-02-09 MED ORDER — ORAL CARE MOUTH RINSE
15.0000 mL | Freq: Two times a day (BID) | OROMUCOSAL | Status: DC
  Administered 2020-02-10 – 2020-02-13 (×5): 15 mL via OROMUCOSAL

## 2020-02-09 MED ORDER — OXYCODONE-ACETAMINOPHEN 5-325 MG PO TABS
1.0000 | ORAL_TABLET | Freq: Four times a day (QID) | ORAL | Status: DC | PRN
  Administered 2020-02-09 – 2020-02-14 (×13): 1 via ORAL
  Filled 2020-02-09 (×12): qty 1

## 2020-02-09 MED ORDER — DILTIAZEM HCL 60 MG PO TABS
30.0000 mg | ORAL_TABLET | Freq: Two times a day (BID) | ORAL | Status: DC
  Administered 2020-02-09 – 2020-02-14 (×11): 30 mg via ORAL
  Filled 2020-02-09 (×12): qty 1

## 2020-02-09 NOTE — Progress Notes (Signed)
[place] for Heparin (Apixaban on hold) Indication: chest pain/ACS, Afib  Allergies  Allergen Reactions  . Amoxicillin-Pot Clavulanate Diarrhea    Has patient had a PCN reaction causing immediate rash, facial/tongue/throat swelling, SOB or lightheadedness with hypotension: Unknown Has patient had a PCN reaction causing severe rash involving mucus membranes or skin necrosis:UnknoWN Has patient had a PCN reaction that required hospitalization: Unknown Has patient had a PCN reaction occurring within the last 10 years: Unknown If all of the above answers are "NO", then may proceed with Cephalosporin use.   . Bupropion Nausea Only  . Nicotine Nausea Only and Rash    Reaction to patches    Patient Measurements: Weight: 171 lb 1.2 oz (77.6 kg) Heparin Dosing Weight: ~68 kg  Vital Signs: Temp: 97.7 F (36.5 C) (02/14 1105) Temp Source: Oral (02/14 1105) BP: 105/70 (02/14 1300) Pulse Rate: 90 (02/14 1200)  Labs: Recent Labs    02/07/20 1441 02/07/20 1441 02/07/20 1542 02/07/20 1844 02/07/20 2102 02/08/20 0257 02/08/20 0257 02/08/20 1213 02/08/20 2047 02/09/20 0251  HGB 10.1*   < >  --   --   --  9.4*   < >  --  9.3* 8.6*  HCT 32.9*   < >  --   --   --  30.9*  --   --  29.8* 28.0*  PLT 315   < >  --   --   --  298  --   --  318 282  APTT  --   --   --   --  44* 51*  --   --   --  88*  LABPROT  --   --   --   --   --  19.0*  --   --   --   --   INR  --   --   --   --   --  1.6*  --   --   --   --   HEPARINUNFRC  --    < >  --   --  0.14* 0.15*   < > 0.16* 0.35 0.41  CREATININE 2.00*  --   --   --   --  2.16*  --   --   --  2.34*  TROPONINIHS  --   --  2,519* 2,907*  --   --   --   --   --   --    < > = values in this interval not displayed.    Estimated Creatinine Clearance: 24 mL/min (A) (by C-G formula based on SCr of 2.34 mg/dL (H)).   Medical History: Past Medical History:  Diagnosis Date  . Depression   . Diabetes mellitus  without complication ([place])   . Heart disease, congenital   . High cholesterol   . Hypertension   . Panic attack   . Stroke [facility])     Medications:  Medications Prior to Admission  Medication Sig Dispense Refill Last Dose  . ACCU-CHEK AVIVA PLUS test strip See admin instructions.  0   . ACCU-CHEK SOFTCLIX LANCETS lancets See admin instructions.  3   . acetaminophen (TYLENOL) 325 MG tablet Take 2 tablets (650 mg total) by mouth every 4 (four) hours as needed for headache or mild pain.   unknown  . albuterol (VENTOLIN HFA) 108 (90 Base) MCG/ACT inhaler Inhale 2 puffs into the lungs every 6 (six) hours as needed for wheezing or shortness of  breath. 18 g 12 unknown  . allopurinol (ZYLOPRIM) 100 MG tablet Take 1 tablet (100 mg total) by mouth daily. 30 tablet 3 02/05/2020  . ALPRAZolam (XANAX) 0.5 MG tablet Take 0.5 tablets (0.25 mg total) by mouth 2 (two) times daily as needed for anxiety. (Patient taking differently: Take 0.25 mg by mouth 3 (three) times daily. ) 10 tablet 0 02/05/2020  . apixaban (ELIQUIS) 2.5 MG TABS tablet Take 1 tablet (2.5 mg total) by mouth 2 (two) times daily. 60 tablet 3 02/05/2020  . aspirin EC 81 MG tablet Take 1 tablet (81 mg total) by mouth daily. 90 tablet 3 02/05/2020  . Blood Glucose Monitoring Suppl (ACCU-CHEK AVIVA PLUS) w/Device KIT See admin instructions.  0   . cholecalciferol (VITAMIN D3) 25 MCG (1000 UT) tablet Take 1,000 Units by mouth daily.   02/05/2020  . diltiazem (CARDIZEM) 90 MG tablet Take 1 tablet (90 mg total) by mouth 2 (two) times daily. 60 tablet 3 02/05/2020  . diphenhydrAMINE (BENADRYL) 50 MG tablet Take 50 mg by mouth at bedtime.   02/05/2020  . furosemide (LASIX) 40 MG tablet Take 1 tablet (40 mg total) by mouth daily. 30 tablet 3 02/05/2020  . gemfibrozil (LOPID) 600 MG tablet Take 600 mg by mouth 2 (two) times daily.  2 02/05/2020  . loperamide (IMODIUM) 2 MG capsule Take 1 capsule (2 mg total) by mouth 4 (four) times daily as needed for diarrhea  or loose stools. (Patient taking differently: Take 2 mg by mouth daily as needed for diarrhea or loose stools. ) 12 capsule 0 02/05/2020  . metoprolol tartrate (LOPRESSOR) 25 MG tablet Take 0.5 tablets (12.5 mg total) by mouth 2 (two) times daily. 30 tablet 3 02/05/2020  . Multiple Vitamin (MULTIVITAMIN WITH MINERALS) TABS tablet Take 1 tablet by mouth daily.   02/05/2020  . ondansetron (ZOFRAN) 4 MG tablet Take 1 tablet (4 mg total) by mouth every 6 (six) hours as needed for nausea or vomiting. 12 tablet 0 02/05/2020  . oxymetazoline (AFRIN) 0.05 % nasal spray Place 1 spray into both nostrils 2 (two) times daily.   unknown  . pantoprazole (PROTONIX) 40 MG tablet Take 1 tablet (40 mg total) by mouth 2 (two) times daily. (Patient taking differently: Take 40 mg by mouth 2 (two) times daily as needed (heartburn). ) 60 tablet 6 02/05/2020  . potassium chloride SA (KLOR-CON) 20 MEQ tablet Take 1 tablet (20 mEq total) by mouth daily. 30 tablet 3 02/05/2020  . prazosin (MINIPRESS) 1 MG capsule Take 1 capsule (1 mg total) by mouth 2 (two) times daily. 60 capsule 3 02/05/2020  . sertraline (ZOLOFT) 100 MG tablet Take 100 mg by mouth at bedtime.    02/05/2020  . tiZANidine (ZANAFLEX) 2 MG tablet Take 1 tablet (2 mg total) by mouth at bedtime as needed for muscle spasms. (Patient taking differently: Take 2 mg by mouth at bedtime. ) 30 tablet 0 unknown  . valACYclovir (VALTREX) 500 MG tablet Take 500 mg by mouth daily.   02/05/2020   Scheduled:  . allopurinol  100 mg Oral Daily  . ALPRAZolam  0.25 mg Oral TID  . aspirin EC  81 mg Oral Daily  . Chlorhexidine Gluconate Cloth  6 each Topical Daily  . cholecalciferol  1,000 Units Oral Daily  . diltiazem  30 mg Oral Q12H  . furosemide  40 mg Intravenous Daily  . insulin aspart  0-6 Units Subcutaneous TID WC  . metoprolol tartrate  25 mg Oral BID  .  multivitamin with minerals  1 tablet Oral Daily  . oxymetazoline  1 spray Each Nare BID  . pantoprazole  40 mg Oral QHS   . potassium chloride SA  20 mEq Oral Daily  . sertraline  100 mg Oral QHS  . sodium chloride flush  3 mL Intravenous Once  . tiZANidine  2 mg Oral QHS  . valACYclovir  500 mg Oral Daily   Infusions:  . dextrose 5 % and 0.45% NaCl    . heparin 1,200 Units/hr (02/09/20 1300)    Assessment: 64 y/o female presenting to the ED with nausea for two days. PMH includes hypertension, type 2 DM, CKD, III, stroke, hyperlipidemia, tobacco use disorder, CABG, Bioprosthetic AV, atrial fibrillation, panic attack and obesity. Patient is taking Eliquis 2.5 mg twice daily for AF, last dose was on 2/10. Pharmacy has been consulted to dose heparin IV infusion for ACS.   Heparin level now therapeutic at 0.4. Bleeding noted earlier around IV site  now resolved and CBC stable on recheck. Possible cath Monday  Goal of Therapy:  APTT 66-102 Heparin level 0.3-0.7 units/ml Monitor platelets by anticoagulation protocol: Yes   Plan:  -Continue heparin 1200 units/h -Recheck with morning labs   Bonnita Nasuti Pharm.D. CPP, BCPS Clinical Pharmacist [phone] 02/09/2020 1:14 PM     Please check AMION for all [place] numbers 02/09/2020

## 2020-02-09 NOTE — Progress Notes (Signed)
Subjective:  Patient denies any chest pain or shortness of breath complains of right shoulder pain.  Denies any hematuria or melena hemoglobin slowly trending down.  And creatinine remains elevated discussed briefly regarding cardiac catheterization its risk and benefits staging the procedure etc. wanted to discuss with Dr. Doylene Canard before consenting. Objective:  Vital Signs in the last 24 hours: Temp:  [97.5 F (36.4 C)-98.5 F (36.9 C)] 98.1 F (36.7 C) (02/14 0748) Pulse Rate:  [92] 92 (02/14 1010) Resp:  [13-26] 14 (02/14 0800) BP: (66-127)/(46-99) 125/69 (02/14 1010) SpO2:  [93 %-99 %] 95 % (02/14 0800) Weight:  [77.6 kg] 77.6 kg (02/14 0600)  Intake/Output from previous day: 02/13 0701 - 02/14 0700 In: 1128.6 [P.O.:700; I.V.:428.6] Out: 625 [Urine:625] Intake/Output from this shift: Total I/O In: 252 [P.O.:240; I.V.:12] Out: 200 [Urine:100; Stool:100]  Physical Exam: Neck: no adenopathy, no carotid bruit, no JVD and supple, symmetrical, trachea midline Lungs: Decreased breath sound at bases Heart: irregularly irregular rhythm, S1, S2 normal and 2/6 systolic murmur noted Abdomen: soft, non-tender; bowel sounds normal; no masses,  no organomegaly Extremities: extremities normal, atraumatic, no cyanosis or edema  Lab Results: Recent Labs    02/08/20 2047 02/09/20 0251  WBC 6.8 6.3  HGB 9.3* 8.6*  PLT 318 282   Recent Labs    02/08/20 0257 02/09/20 0251  NA 141 139  K 4.2 4.3  CL 112* 110  CO2 14* 17*  GLUCOSE 106* 114*  BUN 41* 48*  CREATININE 2.16* 2.34*   No results for input(s): TROPONINI in the last 72 hours.  Invalid input(s): CK, MB Hepatic Function Panel Recent Labs    02/07/20 1542  PROT 7.0  ALBUMIN 3.5  AST 29  ALT 14  ALKPHOS 160*  BILITOT 0.7  BILIDIR <0.1  IBILI NOT CALCULATED   Recent Labs    02/08/20 0257  CHOL 202*   No results for input(s): PROTIME in the last 72 hours.  Imaging: Imaging results have been reviewed and DG  Chest 2 View  Result Date: 02/07/2020 CLINICAL DATA:  Tobacco abuse, hypertension, atrial fibrillation EXAM: CHEST - 2 VIEW COMPARISON:  01/21/2020 FINDINGS: Frontal and lateral views of the chest demonstrate a stable enlarged cardiac silhouette. Postsurgical changes are seen from median sternotomy. There is mild chronic vascular congestion without airspace disease, effusion, or pneumothorax. No acute bony abnormalities. IMPRESSION: 1. Stable mild chronic central vascular congestion. Electronically Signed   By: Randa Ngo M.D.   On: 02/07/2020 14:59   DG Abd 2 Views  Result Date: 02/07/2020 CLINICAL DATA:  Abdominal pain EXAM: ABDOMEN - 2 VIEW COMPARISON:  04/13/2018 FINDINGS: No evidence of bowel obstruction or free air. No organomegaly or suspicious calcification. Coils are seen in the right upper quadrant from prior arterial coiling. Degenerative changes in the lumbar spine. IMPRESSION: No acute findings. Electronically Signed   By: Rolm Baptise M.D.   On: 02/07/2020 18:14    Cardiac Studies:  Assessment/Plan:  Status post small NSTEMI with atypical presentation Acute on chronic diastolic left heart failure CKD, III HTN S/P CABG S/P Stroke Obesity Panic attack Anemia of chronic disease Chronic right shoulder pain Plan Continue present management Check stool for occult blood Check labs in a.m. Rx for right shoulder pain Patient to discuss with Dr. Doylene Canard further regarding cardiac interventions.  LOS: 2 days    Charolette Forward 02/09/2020, 10:34 AM

## 2020-02-09 NOTE — Progress Notes (Signed)
Cardizem gtt was discontinued at this time per [facility].  Patients HR was decreasing into the 50's.

## 2020-02-10 ENCOUNTER — Encounter (HOSPITAL_COMMUNITY)
Admission: EM | Disposition: A | Discharge status: Home / Self Care | Payer: Self-pay | Source: Home / Self Care | Attending: Cardiovascular Disease

## 2020-02-10 ENCOUNTER — Encounter (HOSPITAL_COMMUNITY): Payer: Self-pay

## 2020-02-10 HISTORY — PX: LEFT HEART CATH AND CORONARY ANGIOGRAPHY: CATH118249

## 2020-02-10 LAB — BASIC METABOLIC PANEL
Anion gap: 10 (ref 5–15)
BUN: 45 mg/dL — ABNORMAL HIGH (ref 8–23)
CO2: 17 mmol/L — ABNORMAL LOW (ref 22–32)
Calcium: 8.9 mg/dL (ref 8.9–10.3)
Chloride: 111 mmol/L (ref 98–111)
Creatinine, Ser: 2.53 mg/dL — ABNORMAL HIGH (ref 0.44–1.00)
GFR calc Af Amer: 22 mL/min — ABNORMAL LOW (ref >60)
GFR calc non Af Amer: 19 mL/min — ABNORMAL LOW (ref >60)
Glucose, Bld: 125 mg/dL — ABNORMAL HIGH (ref 70–99)
Potassium: 4.3 mmol/L (ref 3.5–5.1)
Sodium: 138 mmol/L (ref 135–145)

## 2020-02-10 LAB — CBC
HCT: 29.9 % — ABNORMAL LOW (ref 36.0–46.0)
Hemoglobin: 9.1 g/dL — ABNORMAL LOW (ref 12.0–15.0)
MCH: 29.1 pg (ref 26.0–34.0)
MCHC: 30.4 g/dL (ref 30.0–36.0)
MCV: 95.5 fL (ref 80.0–100.0)
Platelets: 291 10*3/uL (ref 150–400)
RBC: 3.13 MIL/uL — ABNORMAL LOW (ref 3.87–5.11)
RDW: 18.1 % — ABNORMAL HIGH (ref 11.5–15.5)
WBC: 7.4 10*3/uL (ref 4.0–10.5)
nRBC: 1 % — ABNORMAL HIGH (ref 0.0–0.2)

## 2020-02-10 LAB — GLUCOSE, CAPILLARY
Glucose-Capillary: 102 mg/dL — ABNORMAL HIGH (ref 70–99)
Glucose-Capillary: 98 mg/dL (ref 70–99)
Glucose-Capillary: 136 mg/dL — ABNORMAL HIGH (ref 70–99)
Glucose-Capillary: 151 mg/dL — ABNORMAL HIGH (ref 70–99)

## 2020-02-10 LAB — APTT: aPTT: 62 seconds — ABNORMAL HIGH (ref 24–36)

## 2020-02-10 LAB — HEPARIN LEVEL (UNFRACTIONATED): Heparin Unfractionated: 0.37 IU/mL (ref 0.30–0.70)

## 2020-02-10 LAB — ECHOCARDIOGRAM COMPLETE: Weight: 2744.29 oz

## 2020-02-10 SURGERY — LEFT HEART CATH AND CORONARY ANGIOGRAPHY
Anesthesia: LOCAL

## 2020-02-10 MED ORDER — FENTANYL CITRATE (PF) 100 MCG/2ML IJ SOLN
INTRAMUSCULAR | Status: AC
  Filled 2020-02-10: qty 2

## 2020-02-10 MED ORDER — LIDOCAINE HCL (PF) 1 % IJ SOLN
INTRAMUSCULAR | Status: AC
  Filled 2020-02-10: qty 30

## 2020-02-10 MED ORDER — SODIUM CHLORIDE 0.9 % IV SOLN: 250.0000 mL | INTRAVENOUS | Status: DC | PRN

## 2020-02-10 MED ORDER — LABETALOL HCL 5 MG/ML IV SOLN: 10.0000 mg | INTRAVENOUS | Status: DC | PRN

## 2020-02-10 MED ORDER — SODIUM CHLORIDE 0.9% FLUSH: 3.0000 mL | Freq: Two times a day (BID) | INTRAVENOUS | Status: DC

## 2020-02-10 MED ORDER — SODIUM CHLORIDE 0.9 % WEIGHT BASED INFUSION: 3.0000 mL/kg/h | INTRAVENOUS | Status: DC

## 2020-02-10 MED ORDER — MIDAZOLAM HCL 2 MG/2ML IJ SOLN
INTRAMUSCULAR | Status: AC
  Filled 2020-02-10: qty 2

## 2020-02-10 MED ORDER — SODIUM CHLORIDE 0.9 % IV SOLN: INTRAVENOUS | Status: DC

## 2020-02-10 MED ORDER — HEPARIN (PORCINE) IN NACL 1000-0.9 UT/500ML-% IV SOLN
INTRAVENOUS | Status: AC
  Filled 2020-02-10: qty 1000

## 2020-02-10 MED ORDER — SODIUM CHLORIDE 0.9 % WEIGHT BASED INFUSION: 1.0000 mL/kg/h | INTRAVENOUS | Status: DC

## 2020-02-10 MED ORDER — HEPARIN (PORCINE) IN NACL 1000-0.9 UT/500ML-% IV SOLN
INTRAVENOUS | Status: DC | PRN
  Administered 2020-02-10 (×2): 500 mL

## 2020-02-10 MED ORDER — MIDAZOLAM HCL 2 MG/2ML IJ SOLN
INTRAMUSCULAR | Status: DC | PRN
  Administered 2020-02-10 (×3): 1 mg via INTRAVENOUS

## 2020-02-10 MED ORDER — FENTANYL CITRATE (PF) 100 MCG/2ML IJ SOLN
INTRAMUSCULAR | Status: DC | PRN
  Administered 2020-02-10 (×2): 25 ug via INTRAVENOUS

## 2020-02-10 MED ORDER — SODIUM CHLORIDE 0.9% FLUSH: 3.0000 mL | INTRAVENOUS | Status: DC | PRN

## 2020-02-10 MED ORDER — OXYCODONE-ACETAMINOPHEN 5-325 MG PO TABS
ORAL_TABLET | ORAL | Status: AC
  Filled 2020-02-10: qty 1

## 2020-02-10 MED ORDER — ALPRAZOLAM 0.25 MG PO TABS
0.2500 mg | ORAL_TABLET | Freq: Four times a day (QID) | ORAL | Status: DC
  Administered 2020-02-10 – 2020-02-14 (×16): 0.25 mg via ORAL
  Filled 2020-02-10 (×18): qty 1

## 2020-02-10 MED ORDER — HEPARIN (PORCINE) 25000 UT/250ML-% IV SOLN
1250.0000 [IU]/h | INTRAVENOUS | Status: DC
  Administered 2020-02-10 – 2020-02-12 (×3): 1200 [IU]/h via INTRAVENOUS
  Filled 2020-02-10 (×3): qty 250

## 2020-02-10 MED ORDER — HYDRALAZINE HCL 20 MG/ML IJ SOLN: 10.0000 mg | INTRAMUSCULAR | Status: DC | PRN

## 2020-02-10 MED ORDER — IOHEXOL 350 MG/ML SOLN
INTRAVENOUS | Status: DC | PRN
  Administered 2020-02-10: 105 mL via INTRA_ARTERIAL

## 2020-02-10 MED ORDER — LIDOCAINE HCL (PF) 1 % IJ SOLN
INTRAMUSCULAR | Status: DC | PRN
  Administered 2020-02-10: 20 mL

## 2020-02-10 SURGICAL SUPPLY — 9 items
CATH DXT MULTI JL4 JR4 ANG PIG (CATHETERS) ×1 IMPLANT
CATH INFINITI 5 FR IM (CATHETERS) ×1 IMPLANT
KIT HEART LEFT (KITS) ×2 IMPLANT
PACK CARDIAC CATHETERIZATION (CUSTOM PROCEDURE TRAY) ×2 IMPLANT
SHEATH PINNACLE 5F 10CM (SHEATH) ×1 IMPLANT
TRANSDUCER W/STOPCOCK (MISCELLANEOUS) ×2 IMPLANT
TUBING CIL FLEX 10 FLL-RA (TUBING) ×1 IMPLANT
WIRE EMERALD 3MM-J .035X150CM (WIRE) ×1 IMPLANT
WIRE EMERALD 3MM-J .035X260CM (WIRE) ×1 IMPLANT

## 2020-02-10 NOTE — Progress Notes (Signed)
Site area: RT GROIN Site Prior to Removal:  Level 0 Pressure Applied For:20 MINUTES Manual:   YES Patient Status During Pull:  AWAKE Post Pull Site:  Level 0 Post Pull Instructions Given:  YES Post Pull Pulses Present: PALPABLE RT DP Dressing Applied:  YES Bedrest begins @ 12:20 Comments:

## 2020-02-10 NOTE — Plan of Care (Signed)
Rested well overnight. Remains in Afib, rate controlled. BP stable. Heparin gtt continues, therapeutic. Denies CP at rest or with exertion. Slight dyspnea with exertion but improved during shift. Required 2 l n/c during sleep, now awake and back on RA.   Oxycodone po for chronic R shoulder pain. Anxiety, on scheduled xanax, responds well to reassurance and education. Slept most of the night. OOB to bedside commode several times, gait steady.   NPO for cardiac cath today.    Problem: Education: Goal: Understanding of cardiac disease, CV risk reduction, and recovery process will improve Outcome: Progressing   Problem: Activity: Goal: Ability to tolerate increased activity will improve Outcome: Progressing   Problem: Cardiac: Goal: Ability to achieve and maintain adequate cardiopulmonary perfusion will improve Outcome: Progressing   Problem: Education: Goal: Knowledge of General Education information will improve Description: Including pain rating scale, medication(s)/side effects and non-pharmacologic comfort measures Outcome: Progressing   Problem: Clinical Measurements: Goal: Respiratory complications will improve Outcome: Progressing Goal: Cardiovascular complication will be avoided Outcome: Progressing   Problem: Coping: Goal: Level of anxiety will decrease Outcome: Progressing   Problem: Pain Managment: Goal: General experience of comfort will improve Outcome: Progressing

## 2020-02-10 NOTE — Progress Notes (Signed)
Spoke to Dr Doylene Canard, pt progressing well, BP/HR stable, no complications at post cath site. Pt needs repeat cath for intervention but MD does not anticipate it being tomorrow, trending labs/kidney function. No need for maintenance IVF, encouraging pt to eat/drink. MD putting in transfer orders, awaiting bed assignment.

## 2020-02-10 NOTE — Progress Notes (Signed)
[place] [facility] for Heparin (Apixaban on hold) Indication: chest pain/ACS, Afib  Allergies  Allergen Reactions  . Amoxicillin-Pot Clavulanate Diarrhea    Has patient had a PCN reaction causing immediate rash, facial/tongue/throat swelling, SOB or lightheadedness with hypotension: Unknown Has patient had a PCN reaction causing severe rash involving mucus membranes or skin necrosis:UnknoWN Has patient had a PCN reaction that required hospitalization: Unknown Has patient had a PCN reaction occurring within the last 10 years: Unknown If all of the above answers are "NO", then may proceed with Cephalosporin use.   . Bupropion Nausea Only  . Nicotine Nausea Only and Rash    Reaction to patches    Patient Measurements: Weight: 171 lb 12.8 oz (77.9 kg) Heparin Dosing Weight: ~68 kg  Vital Signs: Temp: 98.5 F (36.9 C) (02/15 0727) Temp Source: Oral (02/15 0727) BP: 154/82 (02/15 1220) Pulse Rate: 91 (02/15 1220)  Labs: Recent Labs     0000 02/07/20 1542 02/07/20 1844 02/07/20 2102 02/08/20 0257 02/08/20 1213 02/08/20 2047 02/08/20 2047 02/09/20 0251 02/10/20 0233  HGB   < >  --   --   --  9.4*  --  9.3*   < > 8.6* 9.1*  HCT   < >  --   --   --  30.9*  --  29.8*  --  28.0* 29.9*  PLT   < >  --   --   --  298  --  318  --  282 291  APTT  --   --   --    < > 51*  --   --   --  88* 62*  LABPROT  --   --   --   --  19.0*  --   --   --   --   --   INR  --   --   --   --  1.6*  --   --   --   --   --   HEPARINUNFRC  --   --   --    < > 0.15*   < > 0.35  --  0.41 0.37  CREATININE   < >  --   --   --  2.16*  --   --   --  2.34* 2.53*  TROPONINIHS  --  2,519* 2,907*  --   --   --   --   --   --   --    < > = values in this interval not displayed.    Estimated Creatinine Clearance: 22.2 mL/min (A) (by C-G formula based on SCr of 2.53 mg/dL (H)).   Medical History: Past Medical History:  Diagnosis Date  . Depression   . Diabetes mellitus  without complication ([place])   . Heart disease, congenital   . High cholesterol   . Hypertension   . Panic attack   . Stroke [facility])     Medications:  Medications Prior to Admission  Medication Sig Dispense Refill Last Dose  . ACCU-CHEK AVIVA PLUS test strip See admin instructions.  0   . ACCU-CHEK SOFTCLIX LANCETS lancets See admin instructions.  3   . acetaminophen (TYLENOL) 325 MG tablet Take 2 tablets (650 mg total) by mouth every 4 (four) hours as needed for headache or mild pain.   unknown  . albuterol (VENTOLIN HFA) 108 (90 Base) MCG/ACT inhaler Inhale 2 puffs into the lungs every 6 (six) hours as needed for  wheezing or shortness of breath. 18 g 12 unknown  . allopurinol (ZYLOPRIM) 100 MG tablet Take 1 tablet (100 mg total) by mouth daily. 30 tablet 3 02/05/2020  . ALPRAZolam (XANAX) 0.5 MG tablet Take 0.5 tablets (0.25 mg total) by mouth 2 (two) times daily as needed for anxiety. (Patient taking differently: Take 0.25 mg by mouth 3 (three) times daily. ) 10 tablet 0 02/05/2020  . apixaban (ELIQUIS) 2.5 MG TABS tablet Take 1 tablet (2.5 mg total) by mouth 2 (two) times daily. 60 tablet 3 02/05/2020  . aspirin EC 81 MG tablet Take 1 tablet (81 mg total) by mouth daily. 90 tablet 3 02/05/2020  . Blood Glucose Monitoring Suppl (ACCU-CHEK AVIVA PLUS) w/Device KIT See admin instructions.  0   . cholecalciferol (VITAMIN D3) 25 MCG (1000 UT) tablet Take 1,000 Units by mouth daily.   02/05/2020  . diltiazem (CARDIZEM) 90 MG tablet Take 1 tablet (90 mg total) by mouth 2 (two) times daily. 60 tablet 3 02/05/2020  . diphenhydrAMINE (BENADRYL) 50 MG tablet Take 50 mg by mouth at bedtime.   02/05/2020  . furosemide (LASIX) 40 MG tablet Take 1 tablet (40 mg total) by mouth daily. 30 tablet 3 02/05/2020  . gemfibrozil (LOPID) 600 MG tablet Take 600 mg by mouth 2 (two) times daily.  2 02/05/2020  . loperamide (IMODIUM) 2 MG capsule Take 1 capsule (2 mg total) by mouth 4 (four) times daily as needed for diarrhea  or loose stools. (Patient taking differently: Take 2 mg by mouth daily as needed for diarrhea or loose stools. ) 12 capsule 0 02/05/2020  . metoprolol tartrate (LOPRESSOR) 25 MG tablet Take 0.5 tablets (12.5 mg total) by mouth 2 (two) times daily. 30 tablet 3 02/05/2020  . Multiple Vitamin (MULTIVITAMIN WITH MINERALS) TABS tablet Take 1 tablet by mouth daily.   02/05/2020  . ondansetron (ZOFRAN) 4 MG tablet Take 1 tablet (4 mg total) by mouth every 6 (six) hours as needed for nausea or vomiting. 12 tablet 0 02/05/2020  . oxymetazoline (AFRIN) 0.05 % nasal spray Place 1 spray into both nostrils 2 (two) times daily.   unknown  . pantoprazole (PROTONIX) 40 MG tablet Take 1 tablet (40 mg total) by mouth 2 (two) times daily. (Patient taking differently: Take 40 mg by mouth 2 (two) times daily as needed (heartburn). ) 60 tablet 6 02/05/2020  . potassium chloride SA (KLOR-CON) 20 MEQ tablet Take 1 tablet (20 mEq total) by mouth daily. 30 tablet 3 02/05/2020  . prazosin (MINIPRESS) 1 MG capsule Take 1 capsule (1 mg total) by mouth 2 (two) times daily. 60 capsule 3 02/05/2020  . sertraline (ZOLOFT) 100 MG tablet Take 100 mg by mouth at bedtime.    02/05/2020  . tiZANidine (ZANAFLEX) 2 MG tablet Take 1 tablet (2 mg total) by mouth at bedtime as needed for muscle spasms. (Patient taking differently: Take 2 mg by mouth at bedtime. ) 30 tablet 0 unknown  . valACYclovir (VALTREX) 500 MG tablet Take 500 mg by mouth daily.   02/05/2020   Scheduled:  . allopurinol  100 mg Oral Daily  . ALPRAZolam  0.25 mg Oral TID  . aspirin EC  81 mg Oral Daily  . Chlorhexidine Gluconate Cloth  6 each Topical Daily  . cholecalciferol  1,000 Units Oral Daily  . diltiazem  30 mg Oral Q12H  . furosemide  40 mg Intravenous Daily  . insulin aspart  0-6 Units Subcutaneous TID WC  . mouth rinse  15 mL Mouth Rinse BID  . metoprolol tartrate  25 mg Oral BID  . multivitamin with minerals  1 tablet Oral Daily  . oxymetazoline  1 spray Each Nare  BID  . pantoprazole  40 mg Oral QHS  . potassium chloride SA  20 mEq Oral Daily  . sertraline  100 mg Oral QHS  . sodium chloride flush  3 mL Intravenous Once  . sodium chloride flush  3 mL Intravenous Q12H  . sodium chloride flush  3 mL Intravenous Q12H  . tiZANidine  2 mg Oral QHS  . valACYclovir  500 mg Oral Daily   Infusions:  . sodium chloride 100 mL/hr at 02/10/20 1000  . sodium chloride    . sodium chloride    . dextrose 5 % and 0.45% NaCl    . heparin Stopped (02/10/20 1022)    Assessment: 64 y/o female presenting to the ED with nausea for two days. PMH includes hypertension, type 2 DM, CKD, III, stroke, hyperlipidemia, tobacco use disorder, CABG, Bioprosthetic AV, atrial fibrillation, panic attack and obesity. Patient is taking Eliquis 2.5 mg twice daily for AF, last dose was on 2/10. Pharmacy has been consulted to dose heparin IV infusion for ACS.   Heparin level therapeutic at 0.3 this morning. No bleeding noted. CBC stable.  Cath this morning, report pending. Orders to restart heparin tonight.   Goal of Therapy:  Heparin level 0.3-0.7 units/ml Monitor platelets by anticoagulation protocol: Yes   Plan:  -Restart heparin at 1200 units/h -Recheck with morning labs  Erin Hearing PharmD., BCPS Clinical Pharmacist 02/10/2020 1:22 PM

## 2020-02-10 NOTE — Interval H&P Note (Signed)
History and Physical Interval Note:  02/10/2020 10:35 AM  Renee Griffin  has presented today for surgery, with the diagnosis of chest pain.  The various methods of treatment have been discussed with the patient and family. After consideration of risks, benefits and other options for treatment, the patient has consented to  Procedure(s): LEFT HEART CATH AND CORONARY ANGIOGRAPHY (N/A) as a surgical intervention.  The patient's history has been reviewed, patient examined, no change in status, stable for surgery.  I have reviewed the patient's chart and labs.  Questions were answered to the patient's satisfaction.     [name]

## 2020-02-11 LAB — CBC
HCT: 29 % — ABNORMAL LOW (ref 36.0–46.0)
Hemoglobin: 9 g/dL — ABNORMAL LOW (ref 12.0–15.0)
MCH: 29 pg (ref 26.0–34.0)
MCHC: 31 g/dL (ref 30.0–36.0)
MCV: 93.5 fL (ref 80.0–100.0)
Platelets: 281 10*3/uL (ref 150–400)
RBC: 3.1 MIL/uL — ABNORMAL LOW (ref 3.87–5.11)
RDW: 18.1 % — ABNORMAL HIGH (ref 11.5–15.5)
WBC: 7.2 10*3/uL (ref 4.0–10.5)
nRBC: 1.4 % — ABNORMAL HIGH (ref 0.0–0.2)

## 2020-02-11 LAB — BASIC METABOLIC PANEL
Anion gap: 14 (ref 5–15)
BUN: 40 mg/dL — ABNORMAL HIGH (ref 8–23)
CO2: 17 mmol/L — ABNORMAL LOW (ref 22–32)
Calcium: 9.2 mg/dL (ref 8.9–10.3)
Chloride: 106 mmol/L (ref 98–111)
Creatinine, Ser: 2.19 mg/dL — ABNORMAL HIGH (ref 0.44–1.00)
GFR calc Af Amer: 27 mL/min — ABNORMAL LOW (ref >60)
GFR calc non Af Amer: 23 mL/min — ABNORMAL LOW (ref >60)
Glucose, Bld: 114 mg/dL — ABNORMAL HIGH (ref 70–99)
Potassium: 4.1 mmol/L (ref 3.5–5.1)
Sodium: 137 mmol/L (ref 135–145)

## 2020-02-11 LAB — GLUCOSE, CAPILLARY
Glucose-Capillary: 115 mg/dL — ABNORMAL HIGH (ref 70–99)
Glucose-Capillary: 103 mg/dL — ABNORMAL HIGH (ref 70–99)
Glucose-Capillary: 135 mg/dL — ABNORMAL HIGH (ref 70–99)
Glucose-Capillary: 148 mg/dL — ABNORMAL HIGH (ref 70–99)

## 2020-02-11 LAB — POCT ACTIVATED CLOTTING TIME: Activated Clotting Time: 98 seconds

## 2020-02-11 LAB — HEPARIN LEVEL (UNFRACTIONATED): Heparin Unfractionated: 0.32 IU/mL (ref 0.30–0.70)

## 2020-02-11 MED ORDER — OXYMETAZOLINE HCL 0.05 % NA SOLN
2.0000 | Freq: Two times a day (BID) | NASAL | Status: DC
  Administered 2020-02-11: 21:00:00 3 via NASAL
  Administered 2020-02-12 – 2020-02-14 (×4): 2 via NASAL

## 2020-02-11 MED ORDER — LORATADINE 10 MG PO TABS
10.0000 mg | ORAL_TABLET | Freq: Every day | ORAL | Status: DC
  Administered 2020-02-11 – 2020-02-14 (×4): 10 mg via ORAL
  Filled 2020-02-11 (×4): qty 1

## 2020-02-11 NOTE — Plan of Care (Signed)
  Problem: Activity: Goal: Ability to tolerate increased activity will improve Outcome: Progressing   Problem: Cardiac: Goal: Ability to achieve and maintain adequate cardiopulmonary perfusion will improve Outcome: Progressing Goal: Vascular access site(s) Level 0-1 will be maintained Outcome: Progressing   Problem: Education: Goal: Knowledge of General Education information will improve Description: Including pain rating scale, medication(s)/side effects and non-pharmacologic comfort measures Outcome: Progressing   Problem: Clinical Measurements: Goal: Respiratory complications will improve Outcome: Progressing Goal: Cardiovascular complication will be avoided Outcome: Progressing   Problem: Nutrition: Goal: Adequate nutrition will be maintained Outcome: Progressing   Problem: Coping: Goal: Level of anxiety will decrease Outcome: Progressing

## 2020-02-11 NOTE — Progress Notes (Signed)
[place] [facility] for Heparin (Apixaban on hold) Indication: chest pain/ACS, Afib  Allergies  Allergen Reactions  . Amoxicillin-Pot Clavulanate Diarrhea    Has patient had a PCN reaction causing immediate rash, facial/tongue/throat swelling, SOB or lightheadedness with hypotension: Unknown Has patient had a PCN reaction causing severe rash involving mucus membranes or skin necrosis:UnknoWN Has patient had a PCN reaction that required hospitalization: Unknown Has patient had a PCN reaction occurring within the last 10 years: Unknown If all of the above answers are "NO", then may proceed with Cephalosporin use.   . Bupropion Nausea Only  . Nicotine Nausea Only and Rash    Reaction to patches    Patient Measurements: Weight: 169 lb 4.8 oz (76.8 kg) Heparin Dosing Weight: ~68 kg  Vital Signs: Temp: 98 F (36.7 C) (02/16 0740) Temp Source: Oral (02/16 0740) BP: 131/84 (02/16 0740) Pulse Rate: 75 (02/16 0740)  Labs: Recent Labs    02/09/20 0251 02/09/20 0251 02/10/20 0233 02/11/20 0505  HGB 8.6*   < > 9.1* 9.0*  HCT 28.0*  --  29.9* 29.0*  PLT 282  --  291 281  APTT 88*  --  62*  --   HEPARINUNFRC 0.41  --  0.37 0.32  CREATININE 2.34*  --  2.53* 2.19*   < > = values in this interval not displayed.    Estimated Creatinine Clearance: 25.5 mL/min (A) (by C-G formula based on SCr of 2.19 mg/dL (H)).   Medical History: Past Medical History:  Diagnosis Date  . Depression   . Diabetes mellitus without complication ([place])   . Heart disease, congenital   . High cholesterol   . Hypertension   . Panic attack   . Stroke [facility])     Medications:  Medications Prior to Admission  Medication Sig Dispense Refill Last Dose  . ACCU-CHEK AVIVA PLUS test strip See admin instructions.  0   . ACCU-CHEK SOFTCLIX LANCETS lancets See admin instructions.  3   . acetaminophen (TYLENOL) 325 MG tablet Take 2 tablets (650 mg total) by mouth every 4 (four) hours  as needed for headache or mild pain.   unknown  . albuterol (VENTOLIN HFA) 108 (90 Base) MCG/ACT inhaler Inhale 2 puffs into the lungs every 6 (six) hours as needed for wheezing or shortness of breath. 18 g 12 unknown  . allopurinol (ZYLOPRIM) 100 MG tablet Take 1 tablet (100 mg total) by mouth daily. 30 tablet 3 02/05/2020  . ALPRAZolam (XANAX) 0.5 MG tablet Take 0.5 tablets (0.25 mg total) by mouth 2 (two) times daily as needed for anxiety. (Patient taking differently: Take 0.25 mg by mouth 3 (three) times daily. ) 10 tablet 0 02/05/2020  . apixaban (ELIQUIS) 2.5 MG TABS tablet Take 1 tablet (2.5 mg total) by mouth 2 (two) times daily. 60 tablet 3 02/05/2020  . aspirin EC 81 MG tablet Take 1 tablet (81 mg total) by mouth daily. 90 tablet 3 02/05/2020  . Blood Glucose Monitoring Suppl (ACCU-CHEK AVIVA PLUS) w/Device KIT See admin instructions.  0   . cholecalciferol (VITAMIN D3) 25 MCG (1000 UT) tablet Take 1,000 Units by mouth daily.   02/05/2020  . diltiazem (CARDIZEM) 90 MG tablet Take 1 tablet (90 mg total) by mouth 2 (two) times daily. 60 tablet 3 02/05/2020  . diphenhydrAMINE (BENADRYL) 50 MG tablet Take 50 mg by mouth at bedtime.   02/05/2020  . furosemide (LASIX) 40 MG tablet Take 1 tablet (40 mg total) by mouth daily.  30 tablet 3 02/05/2020  . gemfibrozil (LOPID) 600 MG tablet Take 600 mg by mouth 2 (two) times daily.  2 02/05/2020  . loperamide (IMODIUM) 2 MG capsule Take 1 capsule (2 mg total) by mouth 4 (four) times daily as needed for diarrhea or loose stools. (Patient taking differently: Take 2 mg by mouth daily as needed for diarrhea or loose stools. ) 12 capsule 0 02/05/2020  . metoprolol tartrate (LOPRESSOR) 25 MG tablet Take 0.5 tablets (12.5 mg total) by mouth 2 (two) times daily. 30 tablet 3 02/05/2020  . Multiple Vitamin (MULTIVITAMIN WITH MINERALS) TABS tablet Take 1 tablet by mouth daily.   02/05/2020  . ondansetron (ZOFRAN) 4 MG tablet Take 1 tablet (4 mg total) by mouth every 6 (six)  hours as needed for nausea or vomiting. 12 tablet 0 02/05/2020  . oxymetazoline (AFRIN) 0.05 % nasal spray Place 1 spray into both nostrils 2 (two) times daily.   unknown  . pantoprazole (PROTONIX) 40 MG tablet Take 1 tablet (40 mg total) by mouth 2 (two) times daily. (Patient taking differently: Take 40 mg by mouth 2 (two) times daily as needed (heartburn). ) 60 tablet 6 02/05/2020  . potassium chloride SA (KLOR-CON) 20 MEQ tablet Take 1 tablet (20 mEq total) by mouth daily. 30 tablet 3 02/05/2020  . prazosin (MINIPRESS) 1 MG capsule Take 1 capsule (1 mg total) by mouth 2 (two) times daily. 60 capsule 3 02/05/2020  . sertraline (ZOLOFT) 100 MG tablet Take 100 mg by mouth at bedtime.    02/05/2020  . tiZANidine (ZANAFLEX) 2 MG tablet Take 1 tablet (2 mg total) by mouth at bedtime as needed for muscle spasms. (Patient taking differently: Take 2 mg by mouth at bedtime. ) 30 tablet 0 unknown  . valACYclovir (VALTREX) 500 MG tablet Take 500 mg by mouth daily.   02/05/2020   Scheduled:  . allopurinol  100 mg Oral Daily  . ALPRAZolam  0.25 mg Oral QID  . aspirin EC  81 mg Oral Daily  . Chlorhexidine Gluconate Cloth  6 each Topical Daily  . cholecalciferol  1,000 Units Oral Daily  . diltiazem  30 mg Oral Q12H  . insulin aspart  0-6 Units Subcutaneous TID WC  . mouth rinse  15 mL Mouth Rinse BID  . metoprolol tartrate  25 mg Oral BID  . multivitamin with minerals  1 tablet Oral Daily  . oxymetazoline  1 spray Each Nare BID  . pantoprazole  40 mg Oral QHS  . potassium chloride SA  20 mEq Oral Daily  . sertraline  100 mg Oral QHS  . sodium chloride flush  3 mL Intravenous Once  . tiZANidine  2 mg Oral QHS  . valACYclovir  500 mg Oral Daily   Infusions:  . sodium chloride Stopped (02/10/20 2051)  . sodium chloride    . dextrose 5 % and 0.45% NaCl    . heparin 1,200 Units/hr (02/10/20 2048)    Assessment: 64 y/o female presenting to the ED with nausea for two days. PMH includes hypertension, type 2  DM, CKD, III, stroke, hyperlipidemia, tobacco use disorder, CABG, Bioprosthetic AV, atrial fibrillation, panic attack and obesity. Patient is taking Eliquis 2.5 mg twice daily for AF, last dose was on 2/10. Pharmacy has been consulted to dose heparin IV infusion for ACS.   Heparin level remains therapeutic, CBC stable.   Goal of Therapy:  Heparin level 0.3-0.7 units/ml Monitor platelets by anticoagulation protocol: Yes   Plan:  -Continue heparin  1200 units/h -Daily heparin level and CBC -F/U plans for resuming apixaban   Arrie Senate, PharmD, BCPS Clinical Pharmacist [phone] Please check AMION for all [place] numbers 02/11/2020

## 2020-02-11 NOTE — Progress Notes (Signed)
Ref: Dixie Dials, MD   Subjective:  Feeling better. No chest pain. Creatinine remains stable at 2.19. Reviewed cardiac cath results with interventional cardiologist and agreement with medical treatment as risk is higher than benefit and SVG supplying PDA and OM are very small vessels.  Objective:  Vital Signs in the last 24 hours: Temp:  [97.6 F (36.4 C)-98.2 F (36.8 C)] 97.6 F (36.4 C) (02/16 1613) Pulse Rate:  [69-81] 81 (02/16 1613) Cardiac Rhythm: Atrial fibrillation (02/16 0703) Resp:  [12-17] 12 (02/15 2100) BP: (97-144)/(65-100) 136/100 (02/16 1613) SpO2:  [90 %-99 %] 90 % (02/16 1111) Weight:  [76.8 kg] 76.8 kg (02/16 0430)  Physical Exam: BP Readings from Last 1 Encounters:  02/11/20 (!) 136/100     Wt Readings from Last 1 Encounters:  02/11/20 76.8 kg    Weight change: -1.134 kg Body mass index is 29.99 kg/m. HEENT: [place]/AT, Eyes-Blue, PERL, EOMI, Conjunctiva-Pale pink, Sclera-Non-icteric Neck: No JVD, No bruit, Trachea midline. Lungs:  Clearing, Bilateral. Cardiac:  Irregular rhythm, normal S1 and S2, no S3. II/VI systolic and diastolic murmur. Abdomen:  Soft, non-tender. BS present. Extremities:  No edema present. No cyanosis. No clubbing. No right groin hematoma. CNS: AxOx3, Cranial nerves grossly intact, moves all 4 extremities.  Skin: Warm and dry.   Intake/Output from previous day: 02/15 0701 - 02/16 0700 In: 1294.8 [P.O.:820; I.V.:474.8] Out: 1150 [Urine:1150]    Lab Results: BMET    Component Value Date/Time   NA 137 02/11/2020 0505   NA 138 02/10/2020 0233   NA 139 02/09/2020 0251   K 4.1 02/11/2020 0505   K 4.3 02/10/2020 0233   K 4.3 02/09/2020 0251   CL 106 02/11/2020 0505   CL 111 02/10/2020 0233   CL 110 02/09/2020 0251   CO2 17 (L) 02/11/2020 0505   CO2 17 (L) 02/10/2020 0233   CO2 17 (L) 02/09/2020 0251   GLUCOSE 114 (H) 02/11/2020 0505   GLUCOSE 125 (H) 02/10/2020 0233   GLUCOSE 114 (H) 02/09/2020 0251   BUN 40 (H)  02/11/2020 0505   BUN 45 (H) 02/10/2020 0233   BUN 48 (H) 02/09/2020 0251   CREATININE 2.19 (H) 02/11/2020 0505   CREATININE 2.53 (H) 02/10/2020 0233   CREATININE 2.34 (H) 02/09/2020 0251   CALCIUM 9.2 02/11/2020 0505   CALCIUM 8.9 02/10/2020 0233   CALCIUM 8.6 (L) 02/09/2020 0251   GFRNONAA 23 (L) 02/11/2020 0505   GFRNONAA 19 (L) 02/10/2020 0233   GFRNONAA 21 (L) 02/09/2020 0251   GFRAA 27 (L) 02/11/2020 0505   GFRAA 22 (L) 02/10/2020 0233   GFRAA 25 (L) 02/09/2020 0251   CBC    Component Value Date/Time   WBC 7.2 02/11/2020 0505   RBC 3.10 (L) 02/11/2020 0505   HGB 9.0 (L) 02/11/2020 0505   HCT 29.0 (L) 02/11/2020 0505   PLT 281 02/11/2020 0505   MCV 93.5 02/11/2020 0505   MCH 29.0 02/11/2020 0505   MCHC 31.0 02/11/2020 0505   RDW 18.1 (H) 02/11/2020 0505   LYMPHSABS 1.0 01/19/2020 1250   MONOABS 0.6 01/19/2020 1250   EOSABS 0.1 01/19/2020 1250   BASOSABS 0.1 01/19/2020 1250   HEPATIC Function Panel Recent Labs    01/19/20 1250 01/19/20 1525 02/07/20 1542  PROT 6.5 6.7 7.0   HEMOGLOBIN A1C No components found for: HGA1C,  MPG CARDIAC ENZYMES Lab Results  Component Value Date   NLZJQBH 419 (H) 02/01/2018   BNP No results for input(s): PROBNP in the last 8760 hours.  TSH Recent Labs    01/19/20 1250  TSH 2.243   CHOLESTEROL Recent Labs    02/08/20 0257  CHOL 202*    Scheduled Meds: . allopurinol  100 mg Oral Daily  . ALPRAZolam  0.25 mg Oral QID  . aspirin EC  81 mg Oral Daily  . cholecalciferol  1,000 Units Oral Daily  . diltiazem  30 mg Oral Q12H  . insulin aspart  0-6 Units Subcutaneous TID WC  . loratadine  10 mg Oral Daily  . mouth rinse  15 mL Mouth Rinse BID  . metoprolol tartrate  25 mg Oral BID  . multivitamin with minerals  1 tablet Oral Daily  . oxymetazoline  2-3 spray Each Nare BID  . pantoprazole  40 mg Oral QHS  . potassium chloride SA  20 mEq Oral Daily  . sertraline  100 mg Oral QHS  . tiZANidine  2 mg Oral QHS  .  valACYclovir  500 mg Oral Daily   Continuous Infusions: . sodium chloride Stopped (02/10/20 2051)  . sodium chloride    . dextrose 5 % and 0.45% NaCl    . heparin 1,200 Units/hr (02/10/20 2048)   PRN Meds:.sodium chloride, acetaminophen, albuterol, nitroGLYCERIN, ondansetron (ZOFRAN) IV, oxyCODONE-acetaminophen  Assessment/Plan: NSTEMI Acute on chronic diastolic left heart failure Atrial fibrillation, rate controlled, CHA2DS2VASc score 5 CKD, III Hypertension S/P CABG S/P bioprosthetic AV Hyperlipidemia Tobacco use disorder S/P stroke,left Cerebellar and Pons area Obesity Panic attack Right shoulder pain  Continue medical treatment. Increase activity.   LOS: 4 days   Time spent including chart review, lab review, examination, discussion with patient : 30 min   Dixie Dials  MD  02/11/2020, 6:58 PM

## 2020-02-12 ENCOUNTER — Encounter (HOSPITAL_COMMUNITY): Payer: Self-pay

## 2020-02-12 LAB — GLUCOSE, CAPILLARY
Glucose-Capillary: 113 mg/dL — ABNORMAL HIGH (ref 70–99)
Glucose-Capillary: 118 mg/dL — ABNORMAL HIGH (ref 70–99)
Glucose-Capillary: 154 mg/dL — ABNORMAL HIGH (ref 70–99)
Glucose-Capillary: 126 mg/dL — ABNORMAL HIGH (ref 70–99)

## 2020-02-12 LAB — CBC
HCT: 31.6 % — ABNORMAL LOW (ref 36.0–46.0)
Hemoglobin: 9.6 g/dL — ABNORMAL LOW (ref 12.0–15.0)
MCH: 28.7 pg (ref 26.0–34.0)
MCHC: 30.4 g/dL (ref 30.0–36.0)
MCV: 94.6 fL (ref 80.0–100.0)
Platelets: 320 10*3/uL (ref 150–400)
RBC: 3.34 MIL/uL — ABNORMAL LOW (ref 3.87–5.11)
RDW: 18.3 % — ABNORMAL HIGH (ref 11.5–15.5)
WBC: 7.3 10*3/uL (ref 4.0–10.5)
nRBC: 1.6 % — ABNORMAL HIGH (ref 0.0–0.2)

## 2020-02-12 LAB — HEPARIN LEVEL (UNFRACTIONATED): Heparin Unfractionated: 0.36 IU/mL (ref 0.30–0.70)

## 2020-02-12 NOTE — Progress Notes (Signed)
[facility] for Heparin (Apixaban on hold) Indication: chest pain/ACS, Afib  Allergies  Allergen Reactions  . Amoxicillin-Pot Clavulanate Diarrhea    Has patient had a PCN reaction causing immediate rash, facial/tongue/throat swelling, SOB or lightheadedness with hypotension: Unknown Has patient had a PCN reaction causing severe rash involving mucus membranes or skin necrosis:UnknoWN Has patient had a PCN reaction that required hospitalization: Unknown Has patient had a PCN reaction occurring within the last 10 years: Unknown If all of the above answers are "NO", then may proceed with Cephalosporin use.   . Bupropion Nausea Only  . Nicotine Nausea Only and Rash    Reaction to patches    Patient Measurements: Weight: 169 lb (76.7 kg) Heparin Dosing Weight: ~68 kg  Vital Signs: Temp: 98.3 F (36.8 C) (02/17 0411) Temp Source: Oral (02/17 0411) BP: 146/77 (02/17 0951) Pulse Rate: 77 (02/17 0411)  Labs: Recent Labs    02/10/20 0233 02/10/20 0233 02/11/20 0505 02/12/20 0350  HGB 9.1*   < > 9.0* 9.6*  HCT 29.9*  --  29.0* 31.6*  PLT 291  --  281 320  APTT 62*  --   --   --   HEPARINUNFRC 0.37  --  0.32 0.36  CREATININE 2.53*  --  2.19*  --    < > = values in this interval not displayed.    Estimated Creatinine Clearance: 25.4 mL/min (A) (by C-G formula based on SCr of 2.19 mg/dL (H)).   Medical History: Past Medical History:  Diagnosis Date  . Depression   . Diabetes mellitus without complication ([place])   . Heart disease, congenital   . High cholesterol   . Hypertension   . Panic attack   . Stroke [facility])     Medications:  Medications Prior to Admission  Medication Sig Dispense Refill Last Dose  . ACCU-CHEK AVIVA PLUS test strip See admin instructions.  0   . ACCU-CHEK SOFTCLIX LANCETS lancets See admin instructions.  3   . acetaminophen (TYLENOL) 325 MG tablet Take 2 tablets (650 mg total) by mouth every 4 (four) hours as  needed for headache or mild pain.   unknown  . albuterol (VENTOLIN HFA) 108 (90 Base) MCG/ACT inhaler Inhale 2 puffs into the lungs every 6 (six) hours as needed for wheezing or shortness of breath. 18 g 12 unknown  . allopurinol (ZYLOPRIM) 100 MG tablet Take 1 tablet (100 mg total) by mouth daily. 30 tablet 3 02/05/2020  . ALPRAZolam (XANAX) 0.5 MG tablet Take 0.5 tablets (0.25 mg total) by mouth 2 (two) times daily as needed for anxiety. (Patient taking differently: Take 0.25 mg by mouth 3 (three) times daily. ) 10 tablet 0 02/05/2020  . apixaban (ELIQUIS) 2.5 MG TABS tablet Take 1 tablet (2.5 mg total) by mouth 2 (two) times daily. 60 tablet 3 02/05/2020  . aspirin EC 81 MG tablet Take 1 tablet (81 mg total) by mouth daily. 90 tablet 3 02/05/2020  . Blood Glucose Monitoring Suppl (ACCU-CHEK AVIVA PLUS) w/Device KIT See admin instructions.  0   . cholecalciferol (VITAMIN D3) 25 MCG (1000 UT) tablet Take 1,000 Units by mouth daily.   02/05/2020  . diltiazem (CARDIZEM) 90 MG tablet Take 1 tablet (90 mg total) by mouth 2 (two) times daily. 60 tablet 3 02/05/2020  . diphenhydrAMINE (BENADRYL) 50 MG tablet Take 50 mg by mouth at bedtime.   02/05/2020  . furosemide (LASIX) 40 MG tablet Take 1 tablet (40 mg total) by  mouth daily. 30 tablet 3 02/05/2020  . gemfibrozil (LOPID) 600 MG tablet Take 600 mg by mouth 2 (two) times daily.  2 02/05/2020  . loperamide (IMODIUM) 2 MG capsule Take 1 capsule (2 mg total) by mouth 4 (four) times daily as needed for diarrhea or loose stools. (Patient taking differently: Take 2 mg by mouth daily as needed for diarrhea or loose stools. ) 12 capsule 0 02/05/2020  . metoprolol tartrate (LOPRESSOR) 25 MG tablet Take 0.5 tablets (12.5 mg total) by mouth 2 (two) times daily. 30 tablet 3 02/05/2020  . Multiple Vitamin (MULTIVITAMIN WITH MINERALS) TABS tablet Take 1 tablet by mouth daily.   02/05/2020  . ondansetron (ZOFRAN) 4 MG tablet Take 1 tablet (4 mg total) by mouth every 6 (six) hours  as needed for nausea or vomiting. 12 tablet 0 02/05/2020  . oxymetazoline (AFRIN) 0.05 % nasal spray Place 1 spray into both nostrils 2 (two) times daily.   unknown  . pantoprazole (PROTONIX) 40 MG tablet Take 1 tablet (40 mg total) by mouth 2 (two) times daily. (Patient taking differently: Take 40 mg by mouth 2 (two) times daily as needed (heartburn). ) 60 tablet 6 02/05/2020  . potassium chloride SA (KLOR-CON) 20 MEQ tablet Take 1 tablet (20 mEq total) by mouth daily. 30 tablet 3 02/05/2020  . prazosin (MINIPRESS) 1 MG capsule Take 1 capsule (1 mg total) by mouth 2 (two) times daily. 60 capsule 3 02/05/2020  . sertraline (ZOLOFT) 100 MG tablet Take 100 mg by mouth at bedtime.    02/05/2020  . tiZANidine (ZANAFLEX) 2 MG tablet Take 1 tablet (2 mg total) by mouth at bedtime as needed for muscle spasms. (Patient taking differently: Take 2 mg by mouth at bedtime. ) 30 tablet 0 unknown  . valACYclovir (VALTREX) 500 MG tablet Take 500 mg by mouth daily.   02/05/2020   Scheduled:  . allopurinol  100 mg Oral Daily  . ALPRAZolam  0.25 mg Oral QID  . aspirin EC  81 mg Oral Daily  . cholecalciferol  1,000 Units Oral Daily  . diltiazem  30 mg Oral Q12H  . insulin aspart  0-6 Units Subcutaneous TID WC  . loratadine  10 mg Oral Daily  . mouth rinse  15 mL Mouth Rinse BID  . metoprolol tartrate  25 mg Oral BID  . multivitamin with minerals  1 tablet Oral Daily  . oxymetazoline  2-3 spray Each Nare BID  . pantoprazole  40 mg Oral QHS  . potassium chloride SA  20 mEq Oral Daily  . sertraline  100 mg Oral QHS  . tiZANidine  2 mg Oral QHS  . valACYclovir  500 mg Oral Daily   Infusions:  . sodium chloride    . dextrose 5 % and 0.45% NaCl    . heparin 1,200 Units/hr (02/11/20 1916)    Assessment: 64 y/o female presenting to the ED with nausea for two days. PMH includes hypertension, type 2 DM, CKD, III, stroke, hyperlipidemia, tobacco use disorder, CABG, Bioprosthetic AV, atrial fibrillation, panic attack  and obesity. Patient is taking Eliquis 2.5 mg twice daily for AF, last dose was on 2/10. Pharmacy has been consulted to dose heparin IV infusion for ACS.   Heparin level continues to be at goal. Hgb stable in the 9s, pltc within normal limits.   Goal of Therapy:  Heparin level 0.3-0.7 units/ml Monitor platelets by anticoagulation protocol: Yes   Plan:  -Continue heparin 1200 units/h -Daily heparin level and CBC -  F/U plans for resuming apixaban  Erin Hearing PharmD., BCPS Clinical Pharmacist 02/12/2020 12:03 PM

## 2020-02-12 NOTE — Progress Notes (Signed)
Ref: Dixie Dials, MD   Subjective:  Awake. No chest pain. She wants to continue medical therapy only.  Objective:  Vital Signs in the last 24 hours: Temp:  [97.5 F (36.4 C)-98.3 F (36.8 C)] 97.5 F (36.4 C) (02/17 1427) Pulse Rate:  [65-87] 65 (02/17 1427) Cardiac Rhythm: Atrial fibrillation (02/17 0801) Resp:  [14-27] 19 (02/17 0412) BP: (116-146)/(71-83) 116/83 (02/17 1427) SpO2:  [95 %-98 %] 95 % (02/17 1427) Weight:  [76.7 kg] 76.7 kg (02/17 0300)  Physical Exam: BP Readings from Last 1 Encounters:  02/12/20 116/83     Wt Readings from Last 1 Encounters:  02/12/20 76.7 kg    Weight change: -0.136 kg Body mass index is 29.94 kg/m. HEENT: [place]/AT, Eyes-Blue, PERL, EOMI, Conjunctiva-Pale pink, Sclera-Non-icteric Neck: No JVD, No bruit, Trachea midline. Lungs:  Clear, Bilateral. Cardiac:  Regular rhythm, normal S1 and S2, no S3. II/VI systolic murmur. Abdomen:  Soft, non-tender. BS present. Extremities:  No edema present. No cyanosis. No clubbing. CNS: AxOx3, Cranial nerves grossly intact, moves all 4 extremities.  Skin: Warm and dry.   Intake/Output from previous day: 02/16 0701 - 02/17 0700 In: 443.6 [P.O.:240; I.V.:203.6] Out: 500 [Urine:500]    Lab Results: BMET    Component Value Date/Time   NA 137 02/11/2020 0505   NA 138 02/10/2020 0233   NA 139 02/09/2020 0251   K 4.1 02/11/2020 0505   K 4.3 02/10/2020 0233   K 4.3 02/09/2020 0251   CL 106 02/11/2020 0505   CL 111 02/10/2020 0233   CL 110 02/09/2020 0251   CO2 17 (L) 02/11/2020 0505   CO2 17 (L) 02/10/2020 0233   CO2 17 (L) 02/09/2020 0251   GLUCOSE 114 (H) 02/11/2020 0505   GLUCOSE 125 (H) 02/10/2020 0233   GLUCOSE 114 (H) 02/09/2020 0251   BUN 40 (H) 02/11/2020 0505   BUN 45 (H) 02/10/2020 0233   BUN 48 (H) 02/09/2020 0251   CREATININE 2.19 (H) 02/11/2020 0505   CREATININE 2.53 (H) 02/10/2020 0233   CREATININE 2.34 (H) 02/09/2020 0251   CALCIUM 9.2 02/11/2020 0505   CALCIUM 8.9  02/10/2020 0233   CALCIUM 8.6 (L) 02/09/2020 0251   GFRNONAA 23 (L) 02/11/2020 0505   GFRNONAA 19 (L) 02/10/2020 0233   GFRNONAA 21 (L) 02/09/2020 0251   GFRAA 27 (L) 02/11/2020 0505   GFRAA 22 (L) 02/10/2020 0233   GFRAA 25 (L) 02/09/2020 0251   CBC    Component Value Date/Time   WBC 7.3 02/12/2020 0350   RBC 3.34 (L) 02/12/2020 0350   HGB 9.6 (L) 02/12/2020 0350   HCT 31.6 (L) 02/12/2020 0350   PLT 320 02/12/2020 0350   MCV 94.6 02/12/2020 0350   MCH 28.7 02/12/2020 0350   MCHC 30.4 02/12/2020 0350   RDW 18.3 (H) 02/12/2020 0350   LYMPHSABS 1.0 01/19/2020 1250   MONOABS 0.6 01/19/2020 1250   EOSABS 0.1 01/19/2020 1250   BASOSABS 0.1 01/19/2020 1250   HEPATIC Function Panel Recent Labs    01/19/20 1250 01/19/20 1525 02/07/20 1542  PROT 6.5 6.7 7.0   HEMOGLOBIN A1C No components found for: HGA1C,  MPG CARDIAC ENZYMES Lab Results  Component Value Date   DVVOHYW 737 (H) 02/01/2018   BNP No results for input(s): PROBNP in the last 8760 hours. TSH Recent Labs    01/19/20 1250  TSH 2.243   CHOLESTEROL Recent Labs    02/08/20 0257  CHOL 202*    Scheduled Meds: . allopurinol  100 mg  Oral Daily  . ALPRAZolam  0.25 mg Oral QID  . aspirin EC  81 mg Oral Daily  . cholecalciferol  1,000 Units Oral Daily  . diltiazem  30 mg Oral Q12H  . insulin aspart  0-6 Units Subcutaneous TID WC  . loratadine  10 mg Oral Daily  . mouth rinse  15 mL Mouth Rinse BID  . metoprolol tartrate  25 mg Oral BID  . multivitamin with minerals  1 tablet Oral Daily  . oxymetazoline  2-3 spray Each Nare BID  . pantoprazole  40 mg Oral QHS  . potassium chloride SA  20 mEq Oral Daily  . sertraline  100 mg Oral QHS  . tiZANidine  2 mg Oral QHS  . valACYclovir  500 mg Oral Daily   Continuous Infusions: . sodium chloride    . dextrose 5 % and 0.45% NaCl    . heparin 1,200 Units/hr (02/12/20 1804)   PRN Meds:.sodium chloride, acetaminophen, albuterol, nitroGLYCERIN, ondansetron  (ZOFRAN) IV, oxyCODONE-acetaminophen  Assessment/Plan: NSTEMI Multivessel CAD Severe disease of SVG to RCA and sequential OM Acute systolic left heart and chronic diastolic left heart failure Atrial fibrillation, recent CKD, III HTN CABG S/P bioprosthetic AV Hyperlipidemia Tobacco use disorder S/P stroke Panic attack Right shoulder pain  Increase activity.   Continue Aspirin and Brilinta and add anticoagulation after 2-4 weeks.    LOS: 5 days   Time spent including chart review, lab review, examination, discussion with patient : 30 min   Dixie Dials  MD  02/12/2020, 6:18 PM

## 2020-02-12 NOTE — Plan of Care (Signed)
  Problem: Activity: Goal: Ability to tolerate increased activity will improve Outcome: Progressing   Problem: Education: Goal: Knowledge of General Education information will improve Description: Including pain rating scale, medication(s)/side effects and non-pharmacologic comfort measures Outcome: Progressing   Problem: Nutrition: Goal: Adequate nutrition will be maintained Outcome: Progressing   Problem: Coping: Goal: Level of anxiety will decrease Outcome: Progressing   Problem: Pain Managment: Goal: General experience of comfort will improve Outcome: Progressing   Problem: Safety: Goal: Ability to remain free from injury will improve Outcome: Progressing

## 2020-02-12 NOTE — Plan of Care (Signed)
  Problem: Cardiac: Goal: Ability to achieve and maintain adequate cardiopulmonary perfusion will improve Outcome: Progressing Goal: Vascular access site(s) Level 0-1 will be maintained Outcome: Progressing   Problem: Clinical Measurements: Goal: Respiratory complications will improve Outcome: Progressing Goal: Cardiovascular complication will be avoided Outcome: Progressing   Problem: Pain Managment: Goal: General experience of comfort will improve Outcome: Progressing   Problem: Safety: Goal: Ability to remain free from injury will improve Outcome: Progressing   Problem: Skin Integrity: Goal: Risk for impaired skin integrity will decrease Outcome: Progressing

## 2020-02-13 LAB — CBC
HCT: 32.4 % — ABNORMAL LOW (ref 36.0–46.0)
Hemoglobin: 9.9 g/dL — ABNORMAL LOW (ref 12.0–15.0)
MCH: 28.9 pg (ref 26.0–34.0)
MCHC: 30.6 g/dL (ref 30.0–36.0)
MCV: 94.5 fL (ref 80.0–100.0)
Platelets: 335 10*3/uL (ref 150–400)
RBC: 3.43 MIL/uL — ABNORMAL LOW (ref 3.87–5.11)
RDW: 18.4 % — ABNORMAL HIGH (ref 11.5–15.5)
WBC: 8.7 10*3/uL (ref 4.0–10.5)
nRBC: 1.6 % — ABNORMAL HIGH (ref 0.0–0.2)

## 2020-02-13 LAB — COMPREHENSIVE METABOLIC PANEL
ALT: 13 U/L (ref 0–44)
AST: 19 U/L (ref 15–41)
Albumin: 3.1 g/dL — ABNORMAL LOW (ref 3.5–5.0)
Alkaline Phosphatase: 113 U/L (ref 38–126)
Anion gap: 10 (ref 5–15)
BUN: 48 mg/dL — ABNORMAL HIGH (ref 8–23)
CO2: 18 mmol/L — ABNORMAL LOW (ref 22–32)
Calcium: 9.5 mg/dL (ref 8.9–10.3)
Chloride: 112 mmol/L — ABNORMAL HIGH (ref 98–111)
Creatinine, Ser: 2.29 mg/dL — ABNORMAL HIGH (ref 0.44–1.00)
GFR calc Af Amer: 25 mL/min — ABNORMAL LOW (ref >60)
GFR calc non Af Amer: 22 mL/min — ABNORMAL LOW (ref >60)
Glucose, Bld: 129 mg/dL — ABNORMAL HIGH (ref 70–99)
Potassium: 5.6 mmol/L — ABNORMAL HIGH (ref 3.5–5.1)
Sodium: 140 mmol/L (ref 135–145)
Total Bilirubin: 0.7 mg/dL (ref 0.3–1.2)
Total Protein: 6.1 g/dL — ABNORMAL LOW (ref 6.5–8.1)

## 2020-02-13 LAB — HEPARIN LEVEL (UNFRACTIONATED): Heparin Unfractionated: 0.29 IU/mL — ABNORMAL LOW (ref 0.30–0.70)

## 2020-02-13 LAB — GLUCOSE, CAPILLARY
Glucose-Capillary: 102 mg/dL — ABNORMAL HIGH (ref 70–99)
Glucose-Capillary: 120 mg/dL — ABNORMAL HIGH (ref 70–99)
Glucose-Capillary: 115 mg/dL — ABNORMAL HIGH (ref 70–99)
Glucose-Capillary: 148 mg/dL — ABNORMAL HIGH (ref 70–99)

## 2020-02-13 LAB — IRON AND TIBC
Iron: 35 ug/dL (ref 28–170)
Saturation Ratios: 7 % — ABNORMAL LOW (ref 10.4–31.8)
TIBC: 518 ug/dL — ABNORMAL HIGH (ref 250–450)
UIBC: 483 ug/dL

## 2020-02-13 MED ORDER — SODIUM ZIRCONIUM CYCLOSILICATE 10 G PO PACK
10.0000 g | PACK | Freq: Once | ORAL | Status: AC
  Administered 2020-02-13: 10 g via ORAL
  Filled 2020-02-13: qty 1

## 2020-02-13 NOTE — Plan of Care (Signed)
  Problem: Activity: Goal: Ability to tolerate increased activity will improve Outcome: Progressing   Problem: Cardiac: Goal: Vascular access site(s) Level 0-1 will be maintained Outcome: Progressing   Problem: Coping: Goal: Level of anxiety will decrease Outcome: Progressing

## 2020-02-13 NOTE — TOC Initial Note (Signed)
Transition of Care [facility]) - Initial/Assessment Note    Patient Details  Name: SANORA CUNANAN MRN: [id number] Date of Birth: May 01, 1956  [facility] CM/SW Contact:    Bethena Roys, RN Phone Number: 02/13/2020, 1:00 PM  Clinical Narrative:   Patient presented for Nstemi- prior to arrival patient was from home alone. Patient states she has the support of her brother that lives in [place]. Patient still drives and uses a cane for support. [facility]. Patient will benefit from Physical Therapy to consult prior to transition home. Case Manager will continue to follow for additional transition of care needs.                 Expected Discharge Plan: [place] Barriers to Discharge: Continued Medical Work up   Patient Goals and CMS Choice Patient states their goals for this hospitalization and ongoing recovery are:: "to return home"      Expected Discharge Plan and Services Expected Discharge Plan: [place] In-house Referral: NA Discharge Planning Services: CM Consult Post Acute Care Choice: [place] arrangements for the past 2 months: Single Family Home                  Prior Living Arrangements/Services Living arrangements for the past 2 months: Single Family Home Lives with:: Self Patient language and need for interpreter reviewed:: Yes Do you feel safe going back to the place where you live?: Yes      Need for Family Participation in Patient Care: No (Comment) Care giver support system in place?: No (comment)   Criminal Activity/Legal Involvement Pertinent to Current Situation/Hospitalization: No - Comment as needed  Activities of Daily Living Home Assistive Devices/Equipment: Cane (specify quad or straight), Bedside commode/3-in-1, Walker (specify type) ADL Screening (condition at time of admission) Patient's cognitive ability adequate to safely complete daily  activities?: Yes Is the patient deaf or have difficulty hearing?: No Does the patient have difficulty seeing, even when wearing glasses/contacts?: No Does the patient have difficulty concentrating, remembering, or making decisions?: No Patient able to express need for assistance with ADLs?: Yes Does the patient have difficulty dressing or bathing?: No Independently performs ADLs?: Yes (appropriate for developmental age) Does the patient have difficulty walking or climbing stairs?: No Weakness of Legs: None Weakness of Arms/Hands: None   Emotional Assessment Appearance:: Appears stated age Attitude/Demeanor/Rapport: Gracious Affect (typically observed): Appropriate Orientation: : Oriented to Situation, Oriented to  Time, Oriented to Place, Oriented to Self Alcohol / Substance Use: Not Applicable Psych Involvement: No (comment)  Admission diagnosis:  Nausea [R11.0] NSTEMI (non-ST elevated myocardial infarction) ([place]) [I21.4] Atrial fibrillation with RVR ([place]) [I48.91] Abdominal pain [R10.9] Patient Active Problem List   Diagnosis Date Noted  . NSTEMI (non-ST elevated myocardial infarction) ([place]) 02/07/2020  . Acute systolic heart failure ([place]) 01/19/2020  . Acute lower GI bleeding 12/16/2018  . Dehydration 04/13/2018  . Ovarian mass, left   . Abdominal pain 02/01/2018  . Essential hypertension 03/04/2016  . HLD (hyperlipidemia) 03/04/2016  . Type 2 diabetes mellitus with circulatory disorder ([place]) 03/04/2016  . Obesity 03/04/2016  . CVA (cerebral vascular accident) ([place]) 03/05/2015  . Bilateral low back pain without sciatica 03/05/2015  . Essential hypertension, benign 03/05/2015  . Tobacco use disorder 03/05/2015  . Hyperlipidemia 03/05/2015  . Hyperparathyroidism, primary ([place]) 02/13/2013  . Generalized ischemic cerebrovascular disease 02/06/2013  . Hyperreflexia 02/06/2013  . Abnormality of gait 02/06/2013  . Disturbance  of skin sensation 02/06/2013  . CVA (cerebral  infarction) 06/03/2012  . HTN (hypertension) 06/03/2012  . Dyslipidemia 06/03/2012  . Anxiety 06/03/2012   PCP:  Dixie Dials, MD Pharmacy:   [id number] [place] [place] AT [address] [place] [address] Phone: [phone] Fax: [phone]  [place], [place] [address] [place] [address] Phone: [phone] Fax: [phone]     Social Determinants of Health (SDOH) Interventions    Readmission Risk Interventions Readmission Risk Prevention Plan 02/13/2020  Transportation Screening Complete  PCP or Specialist Appt within 3-5 Days Complete  HRI or [place] Complete  Social Work Consult for [place] Planning/Counseling Complete  Palliative Care Screening Not Applicable  Medication Review Press photographer) Complete  Some recent data might be hidden

## 2020-02-13 NOTE — TOC Progression Note (Signed)
Transition of Care [facility]) - Progression Note    Patient Details  Name: Renee Griffin MRN: [id number] Date of Birth: 1956-08-31  Transition [facility] Care [facility]) CM/SW Contact  Graves-Bigelow, Ocie Cornfield, RN Phone Number: 02/13/2020, 3:07 PM  Clinical Narrative:  Patient will stay overnight due to desaturation in oxygen. Physician will reevaluate oxygen in the morning and if the patient will need for home. Case Manager did speak with patient regarding home health and patient has declined home health services due to [place]. Patient does not want anyone coming into the home at this time. Case Manager will follow for oxygen needs for home.   Expected Discharge Plan: [place] Barriers to Discharge: Continued Medical Work up  Expected Discharge Plan and Services Expected Discharge Plan: [place] In-house Referral: NA Discharge Planning Services: CM Consult Post Acute Care Choice: [place] arrangements for the past 2 months: Single Family Home                   Social Determinants of Health (SDOH) Interventions    Readmission Risk Interventions Readmission Risk Prevention Plan 02/13/2020  Transportation Screening Complete  PCP or Specialist Appt within 3-5 Days Complete  HRI or [place] Complete  Social Work Consult for [place] Planning/Counseling Complete  Palliative Care Screening Not Applicable  Medication Review Press photographer) Complete  Some recent data might be hidden

## 2020-02-13 NOTE — Evaluation (Signed)
Physical Therapy Evaluation Patient Details Name: Renee Griffin MRN: [id number] DOB: 08/06/1956 Today's Date: 02/13/2020   History of Present Illness  Pt is a 64 y/o female admitted secondary to NSTEMI to be managed medically. Pt is s/p heart cath. PMH includes dCHF, CVA, HTN, a fib, s/p CABG, and s/p bioprosthetic AV.   Clinical Impression  Pt admitted secondary to problem above with deficits below. Pt requiring min guard A for short distance ambulation. Reports increased fatigue which limited mobility tolerance. Oxygen sats decreasing to 88% on RA during ambulation, however, returned to 97% on RA following seated rest. Pt lives alone and reports no one available to assist; however, does report her brother calls to check on her. Refusing SNF at this time. Feel pt would benefit from [facility] services given current deficits, however, pt may refuse that as well. Will continue to follow acutely to maximize functional mobility independence and safety.      Follow Up Recommendations Home health PT(Refusing SNF )    Equipment Recommendations  None recommended by PT    Recommendations for Other Services       Precautions / Restrictions Precautions Precautions: Fall Restrictions Weight Bearing Restrictions: No      Mobility  Bed Mobility Overal bed mobility: Needs Assistance Bed Mobility: Supine to Sit;Sit to Supine     Supine to sit: Min assist Sit to supine: Supervision   General bed mobility comments: Min A for trunk elevation to come to sitting. Some "wooziness" reported upon sitting which resolved with seated rest.   Transfers Overall transfer level: Needs assistance Equipment used: Quad cane Transfers: Sit to/from Stand Sit to Stand: Min guard         General transfer comment: Min guard for safety. Increased time required to stand  Ambulation/Gait Ambulation/Gait assistance: Min guard Gait Distance (Feet): 15 Feet Assistive device: Quad cane Gait Pattern/deviations:  Step-through pattern;Wide base of support Gait velocity: decreased   General Gait Details: Slow, guarded gait. Pt fatiguing easily, so gait limited to room distance. Oxygen sats decreased to 88% on RA, however, recovered to 97% on RA with seated rest.   Stairs            Wheelchair Mobility    Modified Rankin (Stroke Patients Only)       Balance Overall balance assessment: Mild deficits observed, not formally tested                                           Pertinent Vitals/Pain Pain Assessment: No/denies pain    Home Living Family/patient expects to be discharged to:: Private residence Living Arrangements: Alone Available Help at Discharge: Family Type of Home: Apartment Home Access: Stairs to enter Entrance Stairs-Rails: None Entrance Stairs-Number of Steps: 1 Home Layout: One level Home Equipment: Environmental consultant - 2 wheels;Cane - quad      Prior Function Level of Independence: Independent with assistive device(s)         Comments: Reports using cane at baseline     Hand Dominance        Extremity/Trunk Assessment   Upper Extremity Assessment Upper Extremity Assessment: Generalized weakness    Lower Extremity Assessment Lower Extremity Assessment: Generalized weakness    Cervical / Trunk Assessment Cervical / Trunk Assessment: Kyphotic  Communication   Communication: No difficulties  Cognition Arousal/Alertness: Awake/alert Behavior During Therapy: WFL for tasks assessed/performed Overall Cognitive Status: Within Functional  Limits for tasks assessed                                        General Comments      Exercises     Assessment/Plan    PT Assessment Patient needs continued PT services  PT Problem List Decreased strength;Decreased activity tolerance;Decreased mobility;Decreased balance       PT Treatment Interventions DME instruction;Balance training;Gait training;Stair training;Functional mobility  training;Patient/family education;Therapeutic activities;Therapeutic exercise    PT Goals (Current goals can be found in the Care Plan section)  Acute Rehab PT Goals Patient Stated Goal: to go home PT Goal Formulation: With patient Time For Goal Achievement: 02/27/20 Potential to Achieve Goals: Good    Frequency Min 3X/week   Barriers to discharge Decreased caregiver support      Co-evaluation               AM-PAC PT "6 Clicks" Mobility  Outcome Measure Help needed turning from your back to your side while in a flat bed without using bedrails?: None Help needed moving from lying on your back to sitting on the side of a flat bed without using bedrails?: A Little Help needed moving to and from a bed to a chair (including a wheelchair)?: A Little Help needed standing up from a chair using your arms (e.g., wheelchair or bedside chair)?: A Little Help needed to walk in hospital room?: A Little Help needed climbing 3-5 steps with a railing? : A Lot 6 Click Score: 18    End of Session Equipment Utilized During Treatment: Gait belt Activity Tolerance: Patient limited by fatigue Patient left: in bed;with call bell/phone within reach;with bed alarm set Nurse Communication: Mobility status PT Visit Diagnosis: Muscle weakness (generalized) (M62.81)    Time: 0932-6712 PT Time Calculation (min) (ACUTE ONLY): 16 min   Charges:   PT Evaluation $PT Eval Moderate Complexity: 1 Mod          Lou Miner, DPT  Acute Rehabilitation Services  Pager: [phone] Office: [phone]   Rudean Hitt 02/13/2020, 2:06 PM

## 2020-02-13 NOTE — Progress Notes (Signed)
Pt ambulated in room to commode w/ contact guard assist. Pt encouraged to sit up in chair however wanted to get back into bed. Pt educated on the importance of ambulation. Will continue to monitor.

## 2020-02-13 NOTE — Plan of Care (Signed)
  Problem: Nutrition: Goal: Adequate nutrition will be maintained Outcome: Progressing   

## 2020-02-13 NOTE — Progress Notes (Signed)
Ref: Dixie Dials, MD   Subjective:  Increased shortness of breath with activity. O2 sat down to 88 % with PT. Now on 2 L oxygen by [place]. Serum iron level on low side of normal with only 7 % saturation. Elevated potassium level of 5.6 mmol. Creatinine is elevated at 2.29 mg. Off Potassium supplement and on Lokelma. She is off heparin.  Objective:  Vital Signs in the last 24 hours: Temp:  [97.6 F (36.4 C)-98.1 F (36.7 C)] 97.6 F (36.4 C) (02/18 0641) Pulse Rate:  [70-117] 104 (02/18 1206) Cardiac Rhythm: Atrial fibrillation (02/18 0835) Resp:  [13-21] 16 (02/18 1206) BP: (125-134)/(78-100) 125/78 (02/18 0641) SpO2:  [90 %-94 %] 94 % (02/18 1206) Weight:  [75.6 kg] 75.6 kg (02/18 0641)  Physical Exam: BP Readings from Last 1 Encounters:  02/13/20 125/78     Wt Readings from Last 1 Encounters:  02/13/20 75.6 kg    Weight change: -1.058 kg Body mass index is 29.52 kg/m. HEENT: [place]/AT, Eyes-Blue, PERL, EOMI, Conjunctiva-Pale pink, Sclera-Non-icteric Neck: No JVD, No bruit, Trachea midline. Lungs:  Clear, Bilateral. Cardiac:  IrrRegular rhythm, normal S1 and S2, no S3. II/VI systolic murmur. Abdomen:  Soft, non-tender. BS present. Extremities:  No edema present. No cyanosis. No clubbing. CNS: AxOx3, Cranial nerves grossly intact, moves all 4 extremities.  Skin: Warm and dry.   Intake/Output from previous day: 02/17 0701 - 02/18 0700 In: -  Out: 550 [Urine:550]    Lab Results: BMET    Component Value Date/Time   NA 140 02/13/2020 0346   NA 137 02/11/2020 0505   NA 138 02/10/2020 0233   K 5.6 (H) 02/13/2020 0346   K 4.1 02/11/2020 0505   K 4.3 02/10/2020 0233   CL 112 (H) 02/13/2020 0346   CL 106 02/11/2020 0505   CL 111 02/10/2020 0233   CO2 18 (L) 02/13/2020 0346   CO2 17 (L) 02/11/2020 0505   CO2 17 (L) 02/10/2020 0233   GLUCOSE 129 (H) 02/13/2020 0346   GLUCOSE 114 (H) 02/11/2020 0505   GLUCOSE 125 (H) 02/10/2020 0233   BUN 48 (H) 02/13/2020 0346   BUN 40  (H) 02/11/2020 0505   BUN 45 (H) 02/10/2020 0233   CREATININE 2.29 (H) 02/13/2020 0346   CREATININE 2.19 (H) 02/11/2020 0505   CREATININE 2.53 (H) 02/10/2020 0233   CALCIUM 9.5 02/13/2020 0346   CALCIUM 9.2 02/11/2020 0505   CALCIUM 8.9 02/10/2020 0233   GFRNONAA 22 (L) 02/13/2020 0346   GFRNONAA 23 (L) 02/11/2020 0505   GFRNONAA 19 (L) 02/10/2020 0233   GFRAA 25 (L) 02/13/2020 0346   GFRAA 27 (L) 02/11/2020 0505   GFRAA 22 (L) 02/10/2020 0233   CBC    Component Value Date/Time   WBC 8.7 02/13/2020 0346   RBC 3.43 (L) 02/13/2020 0346   HGB 9.9 (L) 02/13/2020 0346   HCT 32.4 (L) 02/13/2020 0346   PLT 335 02/13/2020 0346   MCV 94.5 02/13/2020 0346   MCH 28.9 02/13/2020 0346   MCHC 30.6 02/13/2020 0346   RDW 18.4 (H) 02/13/2020 0346   LYMPHSABS 1.0 01/19/2020 1250   MONOABS 0.6 01/19/2020 1250   EOSABS 0.1 01/19/2020 1250   BASOSABS 0.1 01/19/2020 1250   HEPATIC Function Panel Recent Labs    01/19/20 1525 02/07/20 1542 02/13/20 0346  PROT 6.7 7.0 6.1*   HEMOGLOBIN A1C No components found for: HGA1C,  MPG CARDIAC ENZYMES Lab Results  Component Value Date   CKTOTAL 735 (H) 02/01/2018  BNP No results for input(s): PROBNP in the last 8760 hours. TSH Recent Labs    01/19/20 1250  TSH 2.243   CHOLESTEROL Recent Labs    02/08/20 0257  CHOL 202*    Scheduled Meds: . allopurinol  100 mg Oral Daily  . ALPRAZolam  0.25 mg Oral QID  . aspirin EC  81 mg Oral Daily  . cholecalciferol  1,000 Units Oral Daily  . diltiazem  30 mg Oral Q12H  . insulin aspart  0-6 Units Subcutaneous TID WC  . loratadine  10 mg Oral Daily  . mouth rinse  15 mL Mouth Rinse BID  . metoprolol tartrate  25 mg Oral BID  . multivitamin with minerals  1 tablet Oral Daily  . oxymetazoline  2-3 spray Each Nare BID  . pantoprazole  40 mg Oral QHS  . sertraline  100 mg Oral QHS  . tiZANidine  2 mg Oral QHS  . valACYclovir  500 mg Oral Daily   Continuous Infusions: . sodium chloride     . dextrose 5 % and 0.45% NaCl     PRN Meds:.sodium chloride, acetaminophen, albuterol, nitroGLYCERIN, ondansetron (ZOFRAN) IV, oxyCODONE-acetaminophen  Assessment/Plan: NSTEMI Multivessel CAD Severe disease of SVG to RCA and sequential to OM Acute systolic and diastolic left heart failure Atrial fibrillation, recent CKD, III Iron deficiency anemia and anemia of chronic disease Hypertension CABG S/P bioprosthetic AV S/Pstroke Panic attack Chronic right shoulder pain COPD Chronic tobacco use disorder  Agree with 2 L oxygen by [place].  May need home O2. Lokelma for hyperkalemia, Off potassium supplement.   LOS: 6 days   Time spent including chart review, lab review, examination, discussion with patient, nurse and case managers : 30 min   Dixie Dials  MD  02/13/2020, 2:48 PM

## 2020-02-13 NOTE — Progress Notes (Signed)
CR working remotely today. Called pt on phone to discuss MI and recovery. Spoke a few minutes however pt stated she did not want to talk on the phone. We will refer to [place] and f/u tomorrow for ambulation and more education. [id number] [place], ACSM 12:07 PM 02/13/2020

## 2020-02-13 NOTE — Progress Notes (Signed)
[facility] for Heparin (Apixaban on hold) Indication: chest pain/ACS, Afib  Allergies  Allergen Reactions  . Amoxicillin-Pot Clavulanate Diarrhea    Has patient had a PCN reaction causing immediate rash, facial/tongue/throat swelling, SOB or lightheadedness with hypotension: Unknown Has patient had a PCN reaction causing severe rash involving mucus membranes or skin necrosis:UnknoWN Has patient had a PCN reaction that required hospitalization: Unknown Has patient had a PCN reaction occurring within the last 10 years: Unknown If all of the above answers are "NO", then may proceed with Cephalosporin use.   . Bupropion Nausea Only  . Nicotine Nausea Only and Rash    Reaction to patches    Patient Measurements: Weight: 166 lb 10.7 oz (75.6 kg) Heparin Dosing Weight: ~68 kg  Vital Signs: Temp: 97.6 F (36.4 C) (02/18 0641) Temp Source: Oral (02/18 0641) BP: 125/78 (02/18 0641) Pulse Rate: 87 (02/18 0641)  Labs: Recent Labs    02/11/20 0505 02/11/20 0505 02/12/20 0350 02/13/20 0346  HGB 9.0*   < > 9.6* 9.9*  HCT 29.0*  --  31.6* 32.4*  PLT 281  --  320 335  HEPARINUNFRC 0.32  --  0.36 0.29*  CREATININE 2.19*  --   --  2.29*   < > = values in this interval not displayed.    Estimated Creatinine Clearance: 24.2 mL/min (A) (by C-G formula based on SCr of 2.29 mg/dL (H)).   Medical History: Past Medical History:  Diagnosis Date  . Depression   . Diabetes mellitus without complication ([place])   . Heart disease, congenital   . High cholesterol   . Hypertension   . Panic attack   . Stroke [facility])     Medications:  Medications Prior to Admission  Medication Sig Dispense Refill Last Dose  . ACCU-CHEK AVIVA PLUS test strip See admin instructions.  0   . ACCU-CHEK SOFTCLIX LANCETS lancets See admin instructions.  3   . acetaminophen (TYLENOL) 325 MG tablet Take 2 tablets (650 mg total) by mouth every 4 (four) hours as needed for  headache or mild pain.   unknown  . albuterol (VENTOLIN HFA) 108 (90 Base) MCG/ACT inhaler Inhale 2 puffs into the lungs every 6 (six) hours as needed for wheezing or shortness of breath. 18 g 12 unknown  . allopurinol (ZYLOPRIM) 100 MG tablet Take 1 tablet (100 mg total) by mouth daily. 30 tablet 3 02/05/2020  . ALPRAZolam (XANAX) 0.5 MG tablet Take 0.5 tablets (0.25 mg total) by mouth 2 (two) times daily as needed for anxiety. (Patient taking differently: Take 0.25 mg by mouth 3 (three) times daily. ) 10 tablet 0 02/05/2020  . apixaban (ELIQUIS) 2.5 MG TABS tablet Take 1 tablet (2.5 mg total) by mouth 2 (two) times daily. 60 tablet 3 02/05/2020  . aspirin EC 81 MG tablet Take 1 tablet (81 mg total) by mouth daily. 90 tablet 3 02/05/2020  . Blood Glucose Monitoring Suppl (ACCU-CHEK AVIVA PLUS) w/Device KIT See admin instructions.  0   . cholecalciferol (VITAMIN D3) 25 MCG (1000 UT) tablet Take 1,000 Units by mouth daily.   02/05/2020  . diltiazem (CARDIZEM) 90 MG tablet Take 1 tablet (90 mg total) by mouth 2 (two) times daily. 60 tablet 3 02/05/2020  . diphenhydrAMINE (BENADRYL) 50 MG tablet Take 50 mg by mouth at bedtime.   02/05/2020  . furosemide (LASIX) 40 MG tablet Take 1 tablet (40 mg total) by mouth daily. 30 tablet 3 02/05/2020  . gemfibrozil (LOPID)  600 MG tablet Take 600 mg by mouth 2 (two) times daily.  2 02/05/2020  . loperamide (IMODIUM) 2 MG capsule Take 1 capsule (2 mg total) by mouth 4 (four) times daily as needed for diarrhea or loose stools. (Patient taking differently: Take 2 mg by mouth daily as needed for diarrhea or loose stools. ) 12 capsule 0 02/05/2020  . metoprolol tartrate (LOPRESSOR) 25 MG tablet Take 0.5 tablets (12.5 mg total) by mouth 2 (two) times daily. 30 tablet 3 02/05/2020  . Multiple Vitamin (MULTIVITAMIN WITH MINERALS) TABS tablet Take 1 tablet by mouth daily.   02/05/2020  . ondansetron (ZOFRAN) 4 MG tablet Take 1 tablet (4 mg total) by mouth every 6 (six) hours as needed  for nausea or vomiting. 12 tablet 0 02/05/2020  . oxymetazoline (AFRIN) 0.05 % nasal spray Place 1 spray into both nostrils 2 (two) times daily.   unknown  . pantoprazole (PROTONIX) 40 MG tablet Take 1 tablet (40 mg total) by mouth 2 (two) times daily. (Patient taking differently: Take 40 mg by mouth 2 (two) times daily as needed (heartburn). ) 60 tablet 6 02/05/2020  . potassium chloride SA (KLOR-CON) 20 MEQ tablet Take 1 tablet (20 mEq total) by mouth daily. 30 tablet 3 02/05/2020  . prazosin (MINIPRESS) 1 MG capsule Take 1 capsule (1 mg total) by mouth 2 (two) times daily. 60 capsule 3 02/05/2020  . sertraline (ZOLOFT) 100 MG tablet Take 100 mg by mouth at bedtime.    02/05/2020  . tiZANidine (ZANAFLEX) 2 MG tablet Take 1 tablet (2 mg total) by mouth at bedtime as needed for muscle spasms. (Patient taking differently: Take 2 mg by mouth at bedtime. ) 30 tablet 0 unknown  . valACYclovir (VALTREX) 500 MG tablet Take 500 mg by mouth daily.   02/05/2020   Scheduled:  . allopurinol  100 mg Oral Daily  . ALPRAZolam  0.25 mg Oral QID  . aspirin EC  81 mg Oral Daily  . cholecalciferol  1,000 Units Oral Daily  . diltiazem  30 mg Oral Q12H  . insulin aspart  0-6 Units Subcutaneous TID WC  . loratadine  10 mg Oral Daily  . mouth rinse  15 mL Mouth Rinse BID  . metoprolol tartrate  25 mg Oral BID  . multivitamin with minerals  1 tablet Oral Daily  . oxymetazoline  2-3 spray Each Nare BID  . pantoprazole  40 mg Oral QHS  . sertraline  100 mg Oral QHS  . tiZANidine  2 mg Oral QHS  . valACYclovir  500 mg Oral Daily   Infusions:  . sodium chloride    . dextrose 5 % and 0.45% NaCl    . heparin 1,200 Units/hr (02/12/20 1804)    Assessment: 64 y/o female presenting to the ED with nausea for two days. PMH includes hypertension, type 2 DM, CKD, III, stroke, hyperlipidemia, tobacco use disorder, CABG, Bioprosthetic AV, atrial fibrillation, panic attack and obesity. Patient is taking Eliquis 2.5 mg twice  daily for AF, last dose was on 2/10. Pharmacy has been consulted to dose heparin IV infusion for ACS.   Heparin level slightly subtherapeutic at 0.29, CBC stable.  Goal of Therapy:  Heparin level 0.3-0.7 units/ml Monitor platelets by anticoagulation protocol: Yes   Plan:  -Increase heparin to 1250 units/h -Daily heparin level and CBC   Arrie Senate, PharmD, BCPS Clinical Pharmacist [phone] Please check AMION for all [place] numbers 02/13/2020

## 2020-02-14 LAB — BASIC METABOLIC PANEL
Anion gap: 11 (ref 5–15)
BUN: 45 mg/dL — ABNORMAL HIGH (ref 8–23)
CO2: 19 mmol/L — ABNORMAL LOW (ref 22–32)
Calcium: 9.2 mg/dL (ref 8.9–10.3)
Chloride: 110 mmol/L (ref 98–111)
Creatinine, Ser: 2.32 mg/dL — ABNORMAL HIGH (ref 0.44–1.00)
GFR calc Af Amer: 25 mL/min — ABNORMAL LOW (ref >60)
GFR calc non Af Amer: 22 mL/min — ABNORMAL LOW (ref >60)
Glucose, Bld: 108 mg/dL — ABNORMAL HIGH (ref 70–99)
Potassium: 4.5 mmol/L (ref 3.5–5.1)
Sodium: 140 mmol/L (ref 135–145)

## 2020-02-14 LAB — GLUCOSE, CAPILLARY
Glucose-Capillary: 105 mg/dL — ABNORMAL HIGH (ref 70–99)
Glucose-Capillary: 106 mg/dL — ABNORMAL HIGH (ref 70–99)

## 2020-02-14 MED ORDER — FERROUS SULFATE 325 (65 FE) MG PO TABS: 325.0000 mg | ORAL_TABLET | Freq: Every day | ORAL | 3 refills | Status: DC

## 2020-02-14 MED ORDER — DILTIAZEM HCL 60 MG PO TABS: 60.0000 mg | ORAL_TABLET | Freq: Two times a day (BID) | ORAL | 3 refills | Status: DC

## 2020-02-14 MED ORDER — NITROGLYCERIN 0.4 MG SL SUBL: 0.4000 mg | SUBLINGUAL_TABLET | SUBLINGUAL | 1 refills | Status: DC | PRN

## 2020-02-14 MED ORDER — FERROUS SULFATE 325 (65 FE) MG PO TABS: 325.0000 mg | ORAL_TABLET | Freq: Every day | ORAL | Status: DC

## 2020-02-14 NOTE — Progress Notes (Addendum)
SATURATION QUALIFICATIONS: (This note is used to comply with regulatory documentation for home oxygen)  Patient Saturations on Room Air at Rest = 97%  Patient Saturations on Room Air while Ambulating = 85%  Patient Saturations on 2 Liters of oxygen while Ambulating = 93%  Please briefly explain why patient needs home oxygen:

## 2020-02-14 NOTE — Progress Notes (Signed)
Discussed MI, restrictions, DM diet, activity, NTG, and CRPII. Pt minimally receptive. She is not interested in smoking cessation despite the risks. I discussed the risks of fire with smoking and O2. She sts she does not have trouble with taking her meds but she declined getting a pill box. Is not interested in [place] although I will refer to [place].  [id number] [place], ACSM 10:51 AM 02/14/2020

## 2020-02-14 NOTE — Discharge Summary (Signed)
Physician Discharge Summary  Patient ID: Renee Griffin MRN: [id number] DOB/AGE: Nov 21, 1956 64 y.o.  Admit date: 02/07/2020 Discharge date: 02/14/2020  Admission Diagnoses: NSTEMI, recent Acute diastolic left heart failure Abdominal pain CKD, III Hypertension Hyperlipidemia CABG S/P bioprosthetic AV S/P stroke, left cerebellar and Pons area Obesity Panic attack  Discharge Diagnoses:  Principle Problem: NSTEMI (non-ST elevated myocardial infarction) ([place]) Active Problem:  Acute on chronic diastolic left heart failure Atrial fibrillation, rate controlled, CHA2DS2VASc score of 5 CKD, III COPD with hypoxemia Hypertension S/P CABG Severe disease of sequential SVG to PDA of RCA and OM of LCX S/P bioprosthetic AV Severe MR and moderate TR Mild pulmonary systolic hypertension Hyperlipidemia S/P stroke, left cerebellar and Pons area Obesity Panic attack Right shoulder pain, chronic  Discharged Condition: fair  Hospital Course: 64 years old female with PMH of hypertension, type 2 DM, CKD, III, stroke, hyperlipidemia, tobacco use disorder, CABG, bioprosthetic AV, atrial fibrillation, panic attack and obesity had nausea x 2 days along with orthopnes as she did not take any medications except alprazolam. Her EKG showed atrial fibrillation with RVR and lateral wall infarct, probably recent. Her Troponin I was elevated at 2519 ng. and her BNP was also elevated at 2622.5 pg. IV diltiazem was started by ER physician and IV heparin and IV lasix were started by me.  Patient denied chest pain before, during and post cardiac catheterization. She was diagnosed as having NSTEMI. Post stabilization from cardiopulmonary stand point patient underwent cardiac catheterization. She had severe proximal vessel disease of Sequential SVG to PDA and OM with slow flow. Both PDA and OM branches were very small in diameter and graft was 65 years old and had diffuse disease also. LAD and rest of LCX had mild  disease and native RCA was very small vessel with severe disease. Hence it was decided to treat patient medically. Patient also wanted to avoid stent placement. . She continued to have respiratory distress after diuresis. Her oxygen level was 88 % with less than 15 feet walk and needed oxygen to improve level to 95 %. She was advised to quit smoking. She agreed to decrease smoking by 50 %, declined nicotine patch as she had rash in past and also declined home health nurse during pandemic. She  Will see me in 1 week. She will continue Eliquis and aspirin as she has prior history of GI bleed and had tolerated Eliquis for atrial fibrillation for 1 month now.  Consults: cardiology  Significant Diagnostic Studies: labs: Normal WBC and Platelets counts. Hgb chronically low at 10.7 to 9.0 g. Iron level low normal but saturation down to 7 %. BNP was 2622.5 pg.  UA showed mild proteinuria.  Troponin I was elevated at 2519 and 2907 ng.  Total cholesterol was 202 mg, HDL low at 25 mg. And LDL elevated at 123 mg. VLDL at 54 mg. And Triglycerides of 269 mg. Hgb A1C was 6.2 %  EKG showed atrial fibrillation with RVR and lateral wall infarct, probably recent.  Echocardiogram showed mild LV systolic dysfunction with EF 45-50 %. Severe MR and Moderate TR.  Cardiac catheterization showed very long left main artery, very small native RCA with severe diffuse disease, Mild disease of LAD and LCX and proximal 95 % stenosis of sequential SVG ro PDA and OM.Marland Kitchen   Treatments: cardiac meds: metoprolol, diltiazem and Apixaban  Discharge Exam: Blood pressure (!) 141/92, pulse 88, temperature (!) 97.5 F (36.4 C), temperature source Oral, resp. rate (!) 21, weight 76.2 kg, SpO2  95 %. General appearance: alert, cooperative and appears stated age. Head: Normocephalic, atraumatic. Eyes: Blue eyes, pale pink conjunctiva, corneas clear. PERRL, EOM's intact.  Neck: No adenopathy, no carotid bruit, no JVD, supple, symmetrical,  trachea midline and thyroid not enlarged. Resp: Clear to auscultation bilaterally. Cardio: Irregular rate and rhythm, S1, S2 normal, II/VI systolic murmur, no click, rub or gallop. GI: Soft, non-tender; bowel sounds normal; no organomegaly. Extremities: No edema, cyanosis or clubbing. Skin: Warm and dry.  Neurologic: Alert and oriented X 3, normal strength and tone. Normal coordination and slow gait with cane.  Disposition: Discharge disposition: 01-Home or Self Care       Discharge Instructions    Amb Referral to Cardiac Rehabilitation   Complete by: As directed    Diagnosis: NSTEMI   After initial evaluation and assessments completed: Virtual Based Care may be provided alone or in conjunction with Phase 2 Cardiac Rehab based on patient barriers.: Yes     Allergies as of 02/14/2020      Reactions   Amoxicillin-pot Clavulanate Diarrhea   Has patient had a PCN reaction causing immediate rash, facial/tongue/throat swelling, SOB or lightheadedness with hypotension: Unknown Has patient had a PCN reaction causing severe rash involving mucus membranes or skin necrosis:UnknoWN Has patient had a PCN reaction that required hospitalization: Unknown Has patient had a PCN reaction occurring within the last 10 years: Unknown If all of the above answers are "NO", then may proceed with Cephalosporin use.   Bupropion Nausea Only   Nicotine Nausea Only, Rash   Reaction to patches      Medication List    STOP taking these medications   potassium chloride SA 20 MEQ tablet Commonly known as: KLOR-CON     TAKE these medications   Accu-Chek Aviva Plus test strip Generic drug: glucose blood See admin instructions.   Accu-Chek Aviva Plus w/Device Kit See admin instructions.   Accu-Chek Softclix Lancets lancets See admin instructions.   acetaminophen 325 MG tablet Commonly known as: TYLENOL Take 2 tablets (650 mg total) by mouth every 4 (four) hours as needed for headache or mild pain.    albuterol 108 (90 Base) MCG/ACT inhaler Commonly known as: VENTOLIN HFA Inhale 2 puffs into the lungs every 6 (six) hours as needed for wheezing or shortness of breath.   allopurinol 100 MG tablet Commonly known as: ZYLOPRIM Take 1 tablet (100 mg total) by mouth daily.   ALPRAZolam 0.5 MG tablet Commonly known as: XANAX Take 0.5 tablets (0.25 mg total) by mouth 2 (two) times daily as needed for anxiety. What changed: when to take this   apixaban 2.5 MG Tabs tablet Commonly known as: ELIQUIS Take 1 tablet (2.5 mg total) by mouth 2 (two) times daily.   aspirin EC 81 MG tablet Take 1 tablet (81 mg total) by mouth daily.   cholecalciferol 25 MCG (1000 UNIT) tablet Commonly known as: VITAMIN D3 Take 1,000 Units by mouth daily.   diltiazem 60 MG tablet Commonly known as: CARDIZEM Take 1 tablet (60 mg total) by mouth 2 (two) times daily. What changed:   medication strength  how much to take   diphenhydrAMINE 50 MG tablet Commonly known as: BENADRYL Take 50 mg by mouth at bedtime.   furosemide 40 MG tablet Commonly known as: LASIX Take 1 tablet (40 mg total) by mouth daily.   gemfibrozil 600 MG tablet Commonly known as: LOPID Take 600 mg by mouth 2 (two) times daily.   loperamide 2 MG capsule Commonly known  as: IMODIUM Take 1 capsule (2 mg total) by mouth 4 (four) times daily as needed for diarrhea or loose stools. What changed: when to take this   metoprolol tartrate 25 MG tablet Commonly known as: LOPRESSOR Take 0.5 tablets (12.5 mg total) by mouth 2 (two) times daily.   multivitamin with minerals Tabs tablet Take 1 tablet by mouth daily.   nitroGLYCERIN 0.4 MG SL tablet Commonly known as: NITROSTAT Place 1 tablet (0.4 mg total) under the tongue every 5 (five) minutes x 3 doses as needed for chest pain.   ondansetron 4 MG tablet Commonly known as: ZOFRAN Take 1 tablet (4 mg total) by mouth every 6 (six) hours as needed for nausea or vomiting.    oxymetazoline 0.05 % nasal spray Commonly known as: AFRIN Place 1 spray into both nostrils 2 (two) times daily.   pantoprazole 40 MG tablet Commonly known as: PROTONIX Take 1 tablet (40 mg total) by mouth 2 (two) times daily. What changed:   when to take this  reasons to take this   prazosin 1 MG capsule Commonly known as: MINIPRESS Take 1 capsule (1 mg total) by mouth 2 (two) times daily.   sertraline 100 MG tablet Commonly known as: ZOLOFT Take 100 mg by mouth at bedtime.   tiZANidine 2 MG tablet Commonly known as: ZANAFLEX Take 1 tablet (2 mg total) by mouth at bedtime as needed for muscle spasms. What changed: when to take this   valACYclovir 500 MG tablet Commonly known as: VALTREX Take 500 mg by mouth daily.            Durable Medical Equipment  (From admission, onward)         Start     Ordered   02/14/20 0957  For home use only DME oxygen  Once    Question Answer Comment  Length of Need Lifetime   Mode or (Route) Nasal cannula   Liters per Minute 2   Frequency Continuous (stationary and portable oxygen unit needed)   Oxygen conserving device Yes   Oxygen delivery system Gas      02/14/20 0957         Follow-up Information    Dixie Dials, MD. Schedule an appointment as soon as possible for a visit on 02/25/2020.   Specialty: Cardiology Why: Earlier if needed. Contact information: [place] [address] [phone]        [facility] Follow up.   Why: [employer] will deliver portable tank O2 to your hospital bed before discharge.  They will coordinate and have your home O2 concentrator delivered to your home today for discharge and home use.   Contact information: [place] [address] [phone]           Time spent: Review of old chart, current chart, lab, x-ray, cardiac tests and discussion with patient over 60 minutes.  Signed: [name] 02/14/2020, 10:20 AM

## 2020-02-14 NOTE — Progress Notes (Signed)
Physical Therapy Treatment Patient Details Name: Renee Griffin MRN: [id number] DOB: 06-19-1956 Today's Date: 02/14/2020    History of Present Illness Pt is a 64 y/o female admitted secondary to NSTEMI to be managed medically. Pt is s/p heart cath. PMH includes dCHF, CVA, HTN, a fib, s/p CABG, and s/p bioprosthetic AV.     PT Comments    Patient received in chair, pleasant and willing to participate in PT today. RN had requested PT assist her in getting dressed to help prepare for DC today, and she was able to do so with setup and S for balance/safety and MinA for line management, extended time and effort required due to fatigue. SpO2 did drop to 87% on room air with dressing efforts and she required multiple rest breaks. Tolerated progression of gait training with QC in room, limited by fatigued and needed one standing rest break even ambulating in-room distances. Had poor pleth with gait on room air but feel confident she dropped to mid-80s on room air, was able to recover to 93% on room air with seated rest/PLB. RN advises she is already getting set up for home O2. She was left up in the chair with all needs met this morning.     Follow Up Recommendations  Home health PT(refusing SNF)     Equipment Recommendations  None recommended by PT    Recommendations for Other Services       Precautions / Restrictions Precautions Precautions: Fall Precaution Comments: watch HR/O2 Restrictions Weight Bearing Restrictions: No    Mobility  Bed Mobility               General bed mobility comments: OOB in chair  Transfers Overall transfer level: Needs assistance   Transfers: Sit to/from Stand Sit to Stand: Supervision         General transfer comment: S for safety/line management, increased time and effort  Ambulation/Gait Ambulation/Gait assistance: Min guard Gait Distance (Feet): 25 Feet Assistive device: Quad cane Gait Pattern/deviations: Step-to pattern;Decreased step  length - right;Decreased step length - left;Decreased stance time - right;Decreased stance time - left;Trunk flexed Gait velocity: decreased   General Gait Details: slow and guarded step to gait pattern, one standing rest break even with progression of in room gait distances. Had poor pleth but can comfortably say sats likely dropped to mid-80s on room air with gait.   Stairs             Wheelchair Mobility    Modified Rankin (Stroke Patients Only)       Balance Overall balance assessment: Mild deficits observed, not formally tested                                          Cognition Arousal/Alertness: Awake/alert Behavior During Therapy: WFL for tasks assessed/performed;Flat affect Overall Cognitive Status: Within Functional Limits for tasks assessed                                        Exercises      General Comments General comments (skin integrity, edema, etc.): SpO2 maintaining 93-94% on room air at rest; did have drops to 87% when donning pants/shirt but recovered to 90s with seated rest/PLB; poor pleth with gait but confident sats dropped to mid-80s with gait, recovered to 90s with  PLB      Pertinent Vitals/Pain Pain Assessment: No/denies pain    Home Living                      Prior Function            PT Goals (current goals can now be found in the care plan section) Acute Rehab PT Goals Patient Stated Goal: to go home PT Goal Formulation: With patient Time For Goal Achievement: 02/27/20 Potential to Achieve Goals: Good Progress towards PT goals: Progressing toward goals    Frequency    Min 3X/week      PT Plan Current plan remains appropriate    Co-evaluation              AM-PAC PT "6 Clicks" Mobility   Outcome Measure  Help needed turning from your back to your side while in a flat bed without using bedrails?: None Help needed moving from lying on your back to sitting on the side of a  flat bed without using bedrails?: A Little Help needed moving to and from a bed to a chair (including a wheelchair)?: A Little Help needed standing up from a chair using your arms (e.g., wheelchair or bedside chair)?: A Little Help needed to walk in hospital room?: A Little Help needed climbing 3-5 steps with a railing? : A Lot 6 Click Score: 18    End of Session   Activity Tolerance: Patient limited by fatigue Patient left: in chair;with call bell/phone within reach Nurse Communication: Mobility status;Other (comment)(sats with activity) PT Visit Diagnosis: Muscle weakness (generalized) (M62.81)     Time: 2952-8413 PT Time Calculation (min) (ACUTE ONLY): 24 min  Charges:  $Gait Training: 8-22 mins $Therapeutic Activity: 8-22 mins                     Windell Norfolk, DPT, PN1   Supplemental Physical Therapist [place]    Pager [phone] Acute Rehab Office [phone]

## 2020-02-14 NOTE — TOC Transition Note (Signed)
Transition of Care [facility]) - CM/SW Discharge Note   Patient Details  Name: Renee Griffin MRN: [id number] Date of Birth: [birth year]  Transition of Care [facility] CM/SW Contact:  Curlene Labrum, RN Phone Number: 02/14/2020, 10:08 AM   Clinical Narrative:    Patient [facility] to [facility] with NSTEMI.  Case Management talked with patient after the bedside with Dr. Doylene Canard concerning home O2 via nasal cannula.  Patient with no previous history of home O2 use.  Patient agreeable to home O2 use and patient offered Medicare choice through [employer].  Patient lives at home alone with brother active with patient care needs and transportation needs at needed.  [employer] called and spoke with Zack, who will coordinate delivery of portable tank to room and home delivery. Patient planning to be discharged today around 2 pm.  Physician spoke with patient about smoking outside for limit of 6 cigarettes a day for safety and fire prevention.  Patient allergic to Nicotine patches.  Will give information for smoking cessation before discharge.  Patient refuses home health assistance.  No other TOC needs noted and plan to discharge home.    Barriers to Discharge: Continued Medical Work up   Patient Goals and CMS Choice Patient states their goals for this hospitalization and ongoing recovery are:: "to return home"    Discharge Plan and Services In-house Referral: NA Discharge Planning Services: CM Consult Post Acute Care Choice: Durable Medical Equipment          DME Arranged: Oxygen(Called [employer] and setup Home O2 - [place] - waiting orders) DME Agency: [employer] Date DME Agency Contacted: 02/14/20 Time DME Agency Contacted: 1008 Representative spoke with at DME Agency: [place]: (patient refusing home health)         Readmission Risk Interventions Readmission Risk Prevention Plan 02/13/2020  Transportation Screening Complete  PCP or Specialist Appt within 3-5 Days Complete  HRI or  [place] Complete  Social Work Consult for [place] Planning/Counseling Complete  Palliative Care Screening Not Applicable  Medication Review Press photographer) Complete  Some recent data might be hidden

## 2020-02-24 ENCOUNTER — Emergency Department (HOSPITAL_COMMUNITY): Payer: Medicare Other

## 2020-02-24 ENCOUNTER — Emergency Department (HOSPITAL_COMMUNITY)
Admission: EM | Admit: 2020-02-24 | Discharge: 2020-02-24 | Disposition: A | Discharge status: Home / Self Care | Payer: Medicare Other | Attending: Emergency Medicine | Admitting: Emergency Medicine

## 2020-02-24 ENCOUNTER — Encounter (HOSPITAL_COMMUNITY): Payer: Self-pay | Admitting: *Deleted

## 2020-02-24 ENCOUNTER — Other Ambulatory Visit: Payer: Self-pay

## 2020-02-24 DIAGNOSIS — I5021 Acute systolic (congestive) heart failure: Secondary | ICD-10-CM | POA: Diagnosis not present

## 2020-02-24 DIAGNOSIS — F17210 Nicotine dependence, cigarettes, uncomplicated: Secondary | ICD-10-CM | POA: Diagnosis not present

## 2020-02-24 DIAGNOSIS — E119 Type 2 diabetes mellitus without complications: Secondary | ICD-10-CM | POA: Diagnosis not present

## 2020-02-24 DIAGNOSIS — Z20822 Contact with and (suspected) exposure to covid-19: Secondary | ICD-10-CM | POA: Insufficient documentation

## 2020-02-24 DIAGNOSIS — F419 Anxiety disorder, unspecified: Secondary | ICD-10-CM | POA: Diagnosis not present

## 2020-02-24 DIAGNOSIS — Z7901 Long term (current) use of anticoagulants: Secondary | ICD-10-CM | POA: Diagnosis not present

## 2020-02-24 DIAGNOSIS — R110 Nausea: Secondary | ICD-10-CM | POA: Insufficient documentation

## 2020-02-24 DIAGNOSIS — R0602 Shortness of breath: Secondary | ICD-10-CM | POA: Insufficient documentation

## 2020-02-24 DIAGNOSIS — Z7984 Long term (current) use of oral hypoglycemic drugs: Secondary | ICD-10-CM | POA: Diagnosis not present

## 2020-02-24 DIAGNOSIS — I110 Hypertensive heart disease with heart failure: Secondary | ICD-10-CM | POA: Insufficient documentation

## 2020-02-24 DIAGNOSIS — Z79899 Other long term (current) drug therapy: Secondary | ICD-10-CM | POA: Diagnosis not present

## 2020-02-24 LAB — SARS CORONAVIRUS 2 (TAT 6-24 HRS): SARS Coronavirus 2: NEGATIVE

## 2020-02-24 LAB — BASIC METABOLIC PANEL
Anion gap: 16 — ABNORMAL HIGH (ref 5–15)
BUN: 33 mg/dL — ABNORMAL HIGH (ref 8–23)
CO2: 18 mmol/L — ABNORMAL LOW (ref 22–32)
Calcium: 10 mg/dL (ref 8.9–10.3)
Chloride: 107 mmol/L (ref 98–111)
Creatinine, Ser: 2.21 mg/dL — ABNORMAL HIGH (ref 0.44–1.00)
GFR calc Af Amer: 26 mL/min — ABNORMAL LOW (ref >60)
GFR calc non Af Amer: 23 mL/min — ABNORMAL LOW (ref >60)
Glucose, Bld: 94 mg/dL (ref 70–99)
Potassium: 3.9 mmol/L (ref 3.5–5.1)
Sodium: 141 mmol/L (ref 135–145)

## 2020-02-24 LAB — URINALYSIS, ROUTINE W REFLEX MICROSCOPIC
Bilirubin Urine: NEGATIVE
Glucose, UA: NEGATIVE mg/dL
Hgb urine dipstick: NEGATIVE
Ketones, ur: NEGATIVE mg/dL
Nitrite: NEGATIVE
Protein, ur: 100 mg/dL — AB
Specific Gravity, Urine: 1.016 (ref 1.005–1.030)
WBC, UA: >50 WBC/hpf — ABNORMAL HIGH (ref 0–5)
pH: 5 (ref 5.0–8.0)

## 2020-02-24 LAB — TROPONIN I (HIGH SENSITIVITY)
Troponin I (High Sensitivity): 24 ng/L — ABNORMAL HIGH (ref <18)
Troponin I (High Sensitivity): 25 ng/L — ABNORMAL HIGH (ref <18)

## 2020-02-24 LAB — CBC
HCT: 38.9 % (ref 36.0–46.0)
Hemoglobin: 11.8 g/dL — ABNORMAL LOW (ref 12.0–15.0)
MCH: 28.9 pg (ref 26.0–34.0)
MCHC: 30.3 g/dL (ref 30.0–36.0)
MCV: 95.3 fL (ref 80.0–100.0)
Platelets: 476 10*3/uL — ABNORMAL HIGH (ref 150–400)
RBC: 4.08 MIL/uL (ref 3.87–5.11)
RDW: 18.4 % — ABNORMAL HIGH (ref 11.5–15.5)
WBC: 7.1 10*3/uL (ref 4.0–10.5)
nRBC: 0 % (ref 0.0–0.2)

## 2020-02-24 LAB — BRAIN NATRIURETIC PEPTIDE: B Natriuretic Peptide: 1962.3 pg/mL — ABNORMAL HIGH (ref 0.0–100.0)

## 2020-02-24 MED ORDER — ONDANSETRON HCL 4 MG/2ML IJ SOLN
4.0000 mg | Freq: Once | INTRAMUSCULAR | Status: AC
  Administered 2020-02-24: 11:00:00 4 mg via INTRAVENOUS
  Filled 2020-02-24: qty 2

## 2020-02-24 MED ORDER — ALPRAZOLAM 0.5 MG PO TABS: 0.5000 mg | ORAL_TABLET | Freq: Two times a day (BID) | ORAL | 0 refills | Status: DC | PRN

## 2020-02-24 MED ORDER — PROMETHAZINE HCL 25 MG PO TABS: 25.0000 mg | ORAL_TABLET | Freq: Four times a day (QID) | ORAL | 0 refills | Status: DC | PRN

## 2020-02-24 MED ORDER — ONDANSETRON 4 MG PO TBDP
4.0000 mg | ORAL_TABLET | Freq: Once | ORAL | Status: AC
  Administered 2020-02-24: 17:00:00 4 mg via ORAL
  Filled 2020-02-24: qty 1

## 2020-02-24 MED ORDER — LORAZEPAM 2 MG/ML IJ SOLN
1.0000 mg | Freq: Once | INTRAMUSCULAR | Status: AC
  Administered 2020-02-24: 1 mg via INTRAVENOUS
  Filled 2020-02-24: qty 1

## 2020-02-24 NOTE — ED Triage Notes (Signed)
Patient presents  To ed via [facility] from home states she was discharged from the hospital last week after being dx. With MI states she was sent home with 02 and she is suppose to be using it at night however she hasn't because she states she wasn't sure how much and how to use it. Patient doesn't know what day she was discharged. Patient states she hasn't followed up with her PCP yet.

## 2020-02-24 NOTE — ED Notes (Signed)
Patient called and updated family

## 2020-02-24 NOTE — ED Notes (Signed)
Patient states she feels better, patient is talking much clearer.

## 2020-02-24 NOTE — ED Notes (Signed)
Lab to add on BNP and troponin to previous sent down specimen.

## 2020-02-24 NOTE — ED Notes (Signed)
Patient has been given her discharge instructions and states her brother is picking her up he will not be here until 7pm patient placed in hallway  11. Diet tray ordered.

## 2020-02-25 LAB — URINE CULTURE

## 2020-03-01 NOTE — ED Provider Notes (Signed)
[facility] EMERGENCY DEPARTMENT Provider Note   CSN: [id number] Arrival date & time: 02/24/20  0076     History Chief Complaint  Patient presents with  . Shortness of Breath    Renee Griffin is a 64 y.o. female.  HPI   64 year old female with nausea.  Recently hospitalized with STEMI.  Save her stomach.  She feels like she may be withdrawing from benzodiazepines.  No vomiting.  Denies any acute pain.  No fevers or chills.  No urinary complaints.  No diarrhea.  Past Medical History:  Diagnosis Date  . Depression   . Diabetes mellitus without complication ([place])   . Heart disease, congenital   . High cholesterol   . Hypertension   . Panic attack   . Stroke [facility])     Patient Active Problem List   Diagnosis Date Noted  . NSTEMI (non-ST elevated myocardial infarction) ([place]) 02/07/2020  . Acute systolic heart failure ([place]) 01/19/2020  . Acute lower GI bleeding 12/16/2018  . Dehydration 04/13/2018  . Ovarian mass, left   . Abdominal pain 02/01/2018  . Essential hypertension 03/04/2016  . HLD (hyperlipidemia) 03/04/2016  . Type 2 diabetes mellitus with circulatory disorder ([place]) 03/04/2016  . Obesity 03/04/2016  . CVA (cerebral vascular accident) ([place]) 03/05/2015  . Bilateral low back pain without sciatica 03/05/2015  . Essential hypertension, benign 03/05/2015  . Tobacco use disorder 03/05/2015  . Hyperlipidemia 03/05/2015  . Hyperparathyroidism, primary ([place]) 02/13/2013  . Generalized ischemic cerebrovascular disease 02/06/2013  . Hyperreflexia 02/06/2013  . Abnormality of gait 02/06/2013  . Disturbance of skin sensation 02/06/2013  . CVA (cerebral infarction) 06/03/2012  . HTN (hypertension) 06/03/2012  . Dyslipidemia 06/03/2012  . Anxiety 06/03/2012    Past Surgical History:  Procedure Laterality Date  . CARDIAC SURGERY  2011   CABG  . COLONOSCOPY WITH PROPOFOL N/A 02/08/2018   Procedure: COLONOSCOPY WITH PROPOFOL;  Surgeon: Otis Brace, MD;  Location: [facility] ENDOSCOPY;  Service: Gastroenterology;  Laterality: N/A;  . COLONOSCOPY WITH PROPOFOL N/A 12/16/2018   Procedure: COLONOSCOPY WITH PROPOFOL;  Surgeon: Ronald Lobo, MD;  Location: [facility] ENDOSCOPY;  Service: Endoscopy;  Laterality: N/A;  . DILATION AND EVACUATION     Patient states she had an abortion 30 or 40 years ago  . ESOPHAGOGASTRODUODENOSCOPY (EGD) WITH PROPOFOL Left 02/05/2018   Procedure: ESOPHAGOGASTRODUODENOSCOPY (EGD) WITH PROPOFOL;  Surgeon: Otis Brace, MD;  Location: [facility] ENDOSCOPY;  Service: Gastroenterology;  Laterality: Left;  . IR ANGIOGRAM SELECTIVE EACH ADDITIONAL VESSEL  12/18/2018  . IR ANGIOGRAM SELECTIVE EACH ADDITIONAL VESSEL  12/18/2018  . IR ANGIOGRAM SELECTIVE EACH ADDITIONAL VESSEL  12/18/2018  . IR ANGIOGRAM SELECTIVE EACH ADDITIONAL VESSEL  12/18/2018  . IR ANGIOGRAM VISCERAL SELECTIVE  12/18/2018  . IR EMBO ART  VEN HEMORR LYMPH EXTRAV  INC GUIDE ROADMAPPING  12/18/2018  . IR US GUIDE VASC ACCESS RIGHT  12/18/2018  . LEFT HEART CATH AND CORONARY ANGIOGRAPHY N/A 02/10/2020   Procedure: LEFT HEART CATH AND CORONARY ANGIOGRAPHY;  Surgeon: Dixie Dials, MD;  Location: [place] [facility];  Service: Cardiovascular;  Laterality: N/A;  . WISDOM TOOTH EXTRACTION       OB History    Gravida  1   Para      Term      Preterm      AB  1   Living  0     SAB      TAB  1   Ectopic      Multiple  Live Births              Family History  Problem Relation Age of Onset  . Cancer Mother        uterian OR cervical  . Cancer Father        lung  . Leukemia Brother   . Cancer Sister        breast    Social History   Tobacco Use  . Smoking status: Current Every Day Smoker    Packs/day: 1.00    Years: 50.00    Pack years: 50.00    Types: Cigarettes  . Smokeless tobacco: Never Used  Substance Use Topics  . Alcohol use: No    Alcohol/week: 0.0 standard drinks  . Drug use: No    Home Medications Prior to  Admission medications   Medication Sig Start Date End Date Taking? Authorizing Provider  ACCU-CHEK AVIVA PLUS test strip See admin instructions. 01/29/16   [provider]  ACCU-CHEK SOFTCLIX LANCETS lancets See admin instructions. 01/30/16   [provider]  acetaminophen (TYLENOL) 325 MG tablet Take 2 tablets (650 mg total) by mouth every 4 (four) hours as needed for headache or mild pain. 01/22/20   Dixie Dials, MD  albuterol (VENTOLIN HFA) 108 (90 Base) MCG/ACT inhaler Inhale 2 puffs into the lungs every 6 (six) hours as needed for wheezing or shortness of breath. 01/22/20   Dixie Dials, MD  allopurinol (ZYLOPRIM) 100 MG tablet Take 1 tablet (100 mg total) by mouth daily. 01/23/20   Dixie Dials, MD  ALPRAZolam Duanne Moron) 0.5 MG tablet Take 1 tablet (0.5 mg total) by mouth 2 (two) times daily as needed for anxiety. 02/24/20   Virgel Manifold, MD  apixaban (ELIQUIS) 2.5 MG TABS tablet Take 1 tablet (2.5 mg total) by mouth 2 (two) times daily. 01/22/20   Dixie Dials, MD  aspirin EC 81 MG tablet Take 1 tablet (81 mg total) by mouth daily. 01/22/20 01/21/21  Dixie Dials, MD  Blood Glucose Monitoring Suppl (ACCU-CHEK AVIVA PLUS) w/Device KIT See admin instructions. 01/29/16   [provider]  cholecalciferol (VITAMIN D3) 25 MCG (1000 UT) tablet Take 1,000 Units by mouth daily.    [provider]  diltiazem (CARDIZEM) 60 MG tablet Take 1 tablet (60 mg total) by mouth 2 (two) times daily. 02/14/20   Dixie Dials, MD  diphenhydrAMINE (BENADRYL) 50 MG tablet Take 50 mg by mouth at bedtime.    [provider]  ferrous sulfate 325 (65 FE) MG tablet Take 1 tablet (325 mg total) by mouth daily with breakfast. 02/15/20   Dixie Dials, MD  furosemide (LASIX) 40 MG tablet Take 1 tablet (40 mg total) by mouth daily. 01/23/20   Dixie Dials, MD  gemfibrozil (LOPID) 600 MG tablet Take 600 mg by mouth 2 (two) times daily. 02/19/18   [provider]  loperamide (IMODIUM) 2  MG capsule Take 1 capsule (2 mg total) by mouth 4 (four) times daily as needed for diarrhea or loose stools. Patient taking differently: Take 2 mg by mouth daily as needed for diarrhea or loose stools.  02/24/19   Julianne Rice, MD  metoprolol tartrate (LOPRESSOR) 25 MG tablet Take 0.5 tablets (12.5 mg total) by mouth 2 (two) times daily. 12/25/18   Dixie Dials, MD  Multiple Vitamin (MULTIVITAMIN WITH MINERALS) TABS tablet Take 1 tablet by mouth daily.    [provider]  nitroGLYCERIN (NITROSTAT) 0.4 MG SL tablet Place 1 tablet (0.4 mg total) under the tongue  every 5 (five) minutes x 3 doses as needed for chest pain. 02/14/20   Dixie Dials, MD  ondansetron (ZOFRAN) 4 MG tablet Take 1 tablet (4 mg total) by mouth every 6 (six) hours as needed for nausea or vomiting. 02/24/19   Julianne Rice, MD  oxymetazoline (AFRIN) 0.05 % nasal spray Place 1 spray into both nostrils 2 (two) times daily.    [provider]  pantoprazole (PROTONIX) 40 MG tablet Take 1 tablet (40 mg total) by mouth 2 (two) times daily. Patient taking differently: Take 40 mg by mouth 2 (two) times daily as needed (heartburn).  02/09/18   Dixie Dials, MD  prazosin (MINIPRESS) 1 MG capsule Take 1 capsule (1 mg total) by mouth 2 (two) times daily. 02/09/18   Dixie Dials, MD  promethazine (PHENERGAN) 25 MG tablet Take 1 tablet (25 mg total) by mouth every 6 (six) hours as needed for nausea or vomiting. 02/24/20   Virgel Manifold, MD  sertraline (ZOLOFT) 100 MG tablet Take 100 mg by mouth at bedtime.     [provider]  tiZANidine (ZANAFLEX) 2 MG tablet Take 1 tablet (2 mg total) by mouth at bedtime as needed for muscle spasms. Patient taking differently: Take 2 mg by mouth at bedtime.  12/25/18   Dixie Dials, MD  valACYclovir (VALTREX) 500 MG tablet Take 500 mg by mouth daily.    [provider]    Allergies    Amoxicillin-pot clavulanate, Bupropion, and Nicotine  Review of Systems   Review  of Systems All systems reviewed and negative, other than as noted in HPI.  Physical Exam Updated Vital Signs BP (!) 151/93   Pulse (!) 54   Temp 98 F (36.7 C) (Oral)   Resp 14   Ht '5\' 3"'  (1.6 m)   Wt 72.6 kg   SpO2 99%   BMI 28.34 kg/m   Physical Exam Vitals and nursing note reviewed.  Constitutional:      General: She is not in acute distress.    Appearance: She is well-developed.  HENT:     Head: Normocephalic and atraumatic.  Eyes:     General:        Right eye: No discharge.        Left eye: No discharge.     Conjunctiva/sclera: Conjunctivae normal.  Cardiovascular:     Rate and Rhythm: Normal rate and regular rhythm.     Heart sounds: Normal heart sounds. No murmur. No friction rub. No gallop.   Pulmonary:     Effort: Pulmonary effort is normal. No respiratory distress.     Breath sounds: Normal breath sounds.  Abdominal:     General: There is no distension.     Palpations: Abdomen is soft.     Tenderness: There is no abdominal tenderness.  Musculoskeletal:        General: No tenderness.     Cervical back: Neck supple.  Skin:    General: Skin is warm and dry.  Neurological:     Mental Status: She is alert.  Psychiatric:        Behavior: Behavior normal.        Thought Content: Thought content normal.     ED Results / Procedures / Treatments   Labs (all labs ordered are listed, but only abnormal results are displayed) Labs Reviewed  URINE CULTURE - Abnormal; Notable for the following components:      Result Value   Culture MULTIPLE SPECIES PRESENT, SUGGEST RECOLLECTION (*)  All other components within normal limits  BASIC METABOLIC PANEL - Abnormal; Notable for the following components:   CO2 18 (*)    BUN 33 (*)    Creatinine, Ser 2.21 (*)    GFR calc non Af Amer 23 (*)    GFR calc Af Amer 26 (*)    Anion gap 16 (*)    All other components within normal limits  CBC - Abnormal; Notable for the following components:   Hemoglobin 11.8 (*)     RDW 18.4 (*)    Platelets 476 (*)    All other components within normal limits  URINALYSIS, ROUTINE W REFLEX MICROSCOPIC - Abnormal; Notable for the following components:   APPearance HAZY (*)    Protein, ur 100 (*)    Leukocytes,Ua LARGE (*)    WBC, UA >50 (*)    Bacteria, UA RARE (*)    All other components within normal limits  BRAIN NATRIURETIC PEPTIDE - Abnormal; Notable for the following components:   B Natriuretic Peptide 1,962.3 (*)    All other components within normal limits  TROPONIN I (HIGH SENSITIVITY) - Abnormal; Notable for the following components:   Troponin I (High Sensitivity) 24 (*)    All other components within normal limits  TROPONIN I (HIGH SENSITIVITY) - Abnormal; Notable for the following components:   Troponin I (High Sensitivity) 25 (*)    All other components within normal limits  SARS CORONAVIRUS 2 (TAT 6-24 HRS)    EKG EKG Interpretation  Date/Time:  Monday February 24 2020 09:01:52 EST Ventricular Rate:  110 PR Interval:    QRS Duration: 90 QT Interval:  345 QTC Calculation: 398 R Axis:   -19 Text Interpretation: Atrial fibrillation Ventricular premature complex Borderline left axis deviation Anterior infarct, old Borderline T abnormalities, inferior leads Confirmed by Virgel Manifold [phone]) on 02/24/2020 10:44:20 AM   Radiology No results found.  Procedures Procedures (including critical care time)  Medications Ordered in ED Medications  ondansetron (ZOFRAN) injection 4 mg (4 mg Intravenous Given 02/24/20 1055)  LORazepam (ATIVAN) injection 1 mg (1 mg Intravenous Given 02/24/20 1055)  ondansetron (ZOFRAN-ODT) disintegrating tablet 4 mg (4 mg Oral Given 02/24/20 1722)    ED Course  I have reviewed the triage vital signs and the nursing notes.  Pertinent labs & imaging results that were available during my care of the patient were reviewed by me and considered in my medical decision making (see chart for details).    MDM  Rules/Calculators/A&P                      64 year old female with nausea and anxiety.  Improved with a dose of medicine here in the emergency room.  Multiple requests for Xanax.  I gave her small amount of explained that if she feels like she needs additional beyond this that she needs described with her PCP or whoever normally prescribes these to her.  Components is minimally elevated and trending down.  She has no acute complaints but I think I can attribute clearly to ACS.  I doubt emergent process.  Return precautions discussed.  Patient is.  Final Clinical Impression(s) / ED Diagnoses Final diagnoses:  Nausea    Rx / DC Orders ED Discharge Orders         Ordered    promethazine (PHENERGAN) 25 MG tablet  Every 6 hours PRN     02/24/20 1343    ALPRAZolam (XANAX) 0.5 MG tablet  2 times daily  PRN     02/24/20 1345           Virgel Manifold, MD 03/01/20 1126

## 2020-04-03 ENCOUNTER — Ambulatory Visit: Payer: Medicare Other

## 2020-04-04 ENCOUNTER — Ambulatory Visit: Payer: Medicare Other | Attending: Internal Medicine

## 2020-04-04 DIAGNOSIS — Z23 Encounter for immunization: Secondary | ICD-10-CM

## 2020-04-04 NOTE — Progress Notes (Signed)
   Covid-19 Vaccination Clinic  Name:  SPENCER PETERKIN    MRN: [id number] DOB: 09-Jun-1956  04/04/2020  Ms. Helinski was observed post Covid-19 immunization for 15 minutes without incident. She was provided with Vaccine Information Sheet and instruction to access the V-Safe system.   Ms. Mackel was instructed to call 911 with any severe reactions post vaccine: Marland Kitchen Difficulty breathing  . Swelling of face and throat  . A fast heartbeat  . A bad rash all over body  . Dizziness and weakness   Immunizations Administered    Name Date Dose VIS Date Route   Pfizer COVID-19 Vaccine 04/04/2020 12:05 PM 0.3 mL 12/06/2019 Intramuscular   Manufacturer: [place]   Lot: [id number]   [place]: [id number]

## 2020-04-27 ENCOUNTER — Ambulatory Visit: Payer: Medicare Other

## 2020-05-02 ENCOUNTER — Ambulatory Visit: Payer: Medicare Other | Attending: Internal Medicine

## 2020-05-02 DIAGNOSIS — Z23 Encounter for immunization: Secondary | ICD-10-CM

## 2020-05-02 NOTE — Progress Notes (Signed)
   Covid-19 Vaccination Clinic  Name:  Renee Griffin    MRN: [id number] DOB: 21-Aug-1956  05/02/2020  [name] was observed post Covid-19 immunization for 15 minutes without incident. She was provided with Vaccine Information Sheet and instruction to access the V-Safe system.   [name] was instructed to call 911 with any severe reactions post vaccine: Marland Kitchen Difficulty breathing  . Swelling of face and throat  . A fast heartbeat  . A bad rash all over body  . Dizziness and weakness   Immunizations Administered    Name Date Dose VIS Date Route   Pfizer COVID-19 Vaccine 05/02/2020 11:28 AM 0.3 mL 02/19/2019 Intramuscular   Manufacturer: [place]   Lot: [id number]   [place]: [id number]

## 2020-05-17 ENCOUNTER — Inpatient Hospital Stay (HOSPITAL_COMMUNITY)
Admission: EM | Admit: 2020-05-17 | Discharge: 2020-05-21 | DRG: 291 | Disposition: A | Discharge status: Home / Self Care | Payer: Medicare Other | Attending: Cardiology | Admitting: Cardiology

## 2020-05-17 ENCOUNTER — Emergency Department (HOSPITAL_COMMUNITY): Payer: Medicare Other

## 2020-05-17 ENCOUNTER — Encounter (HOSPITAL_COMMUNITY): Payer: Self-pay

## 2020-05-17 DIAGNOSIS — I248 Other forms of acute ischemic heart disease: Secondary | ICD-10-CM | POA: Diagnosis present

## 2020-05-17 DIAGNOSIS — Z7982 Long term (current) use of aspirin: Secondary | ICD-10-CM

## 2020-05-17 DIAGNOSIS — E785 Hyperlipidemia, unspecified: Secondary | ICD-10-CM | POA: Diagnosis present

## 2020-05-17 DIAGNOSIS — E1122 Type 2 diabetes mellitus with diabetic chronic kidney disease: Secondary | ICD-10-CM | POA: Diagnosis present

## 2020-05-17 DIAGNOSIS — I130 Hypertensive heart and chronic kidney disease with heart failure and stage 1 through stage 4 chronic kidney disease, or unspecified chronic kidney disease: Principal | ICD-10-CM | POA: Diagnosis present

## 2020-05-17 DIAGNOSIS — I272 Pulmonary hypertension, unspecified: Secondary | ICD-10-CM | POA: Diagnosis present

## 2020-05-17 DIAGNOSIS — Z79899 Other long term (current) drug therapy: Secondary | ICD-10-CM | POA: Diagnosis not present

## 2020-05-17 DIAGNOSIS — Z20822 Contact with and (suspected) exposure to covid-19: Secondary | ICD-10-CM | POA: Diagnosis present

## 2020-05-17 DIAGNOSIS — F411 Generalized anxiety disorder: Secondary | ICD-10-CM | POA: Diagnosis present

## 2020-05-17 DIAGNOSIS — R0902 Hypoxemia: Secondary | ICD-10-CM | POA: Diagnosis present

## 2020-05-17 DIAGNOSIS — F410 Panic disorder [episodic paroxysmal anxiety] without agoraphobia: Secondary | ICD-10-CM | POA: Diagnosis present

## 2020-05-17 DIAGNOSIS — I482 Chronic atrial fibrillation, unspecified: Secondary | ICD-10-CM | POA: Diagnosis present

## 2020-05-17 DIAGNOSIS — Z8673 Personal history of transient ischemic attack (TIA), and cerebral infarction without residual deficits: Secondary | ICD-10-CM

## 2020-05-17 DIAGNOSIS — E7800 Pure hypercholesterolemia, unspecified: Secondary | ICD-10-CM | POA: Diagnosis present

## 2020-05-17 DIAGNOSIS — N184 Chronic kidney disease, stage 4 (severe): Secondary | ICD-10-CM | POA: Diagnosis present

## 2020-05-17 DIAGNOSIS — I2510 Atherosclerotic heart disease of native coronary artery without angina pectoris: Secondary | ICD-10-CM | POA: Diagnosis present

## 2020-05-17 DIAGNOSIS — D500 Iron deficiency anemia secondary to blood loss (chronic): Secondary | ICD-10-CM | POA: Diagnosis present

## 2020-05-17 DIAGNOSIS — J449 Chronic obstructive pulmonary disease, unspecified: Secondary | ICD-10-CM | POA: Diagnosis present

## 2020-05-17 DIAGNOSIS — E669 Obesity, unspecified: Secondary | ICD-10-CM | POA: Diagnosis present

## 2020-05-17 DIAGNOSIS — E876 Hypokalemia: Secondary | ICD-10-CM | POA: Diagnosis present

## 2020-05-17 DIAGNOSIS — I509 Heart failure, unspecified: Secondary | ICD-10-CM | POA: Diagnosis not present

## 2020-05-17 DIAGNOSIS — I5043 Acute on chronic combined systolic (congestive) and diastolic (congestive) heart failure: Secondary | ICD-10-CM | POA: Diagnosis present

## 2020-05-17 DIAGNOSIS — I252 Old myocardial infarction: Secondary | ICD-10-CM | POA: Diagnosis not present

## 2020-05-17 DIAGNOSIS — Z953 Presence of xenogenic heart valve: Secondary | ICD-10-CM

## 2020-05-17 DIAGNOSIS — E1165 Type 2 diabetes mellitus with hyperglycemia: Secondary | ICD-10-CM | POA: Diagnosis present

## 2020-05-17 DIAGNOSIS — Z7901 Long term (current) use of anticoagulants: Secondary | ICD-10-CM

## 2020-05-17 DIAGNOSIS — J810 Acute pulmonary edema: Secondary | ICD-10-CM

## 2020-05-17 DIAGNOSIS — F17210 Nicotine dependence, cigarettes, uncomplicated: Secondary | ICD-10-CM | POA: Diagnosis present

## 2020-05-17 DIAGNOSIS — R778 Other specified abnormalities of plasma proteins: Secondary | ICD-10-CM

## 2020-05-17 LAB — DIFFERENTIAL
Abs Immature Granulocytes: 0.04 10*3/uL (ref 0.00–0.07)
Basophils Absolute: 0.1 10*3/uL (ref 0.0–0.1)
Basophils Relative: 1 %
Eosinophils Absolute: 0.2 10*3/uL (ref 0.0–0.5)
Eosinophils Relative: 3 %
Immature Granulocytes: 1 %
Lymphocytes Relative: 16 %
Lymphs Abs: 1.2 10*3/uL (ref 0.7–4.0)
Monocytes Absolute: 0.6 10*3/uL (ref 0.1–1.0)
Monocytes Relative: 7 %
Neutro Abs: 5.5 10*3/uL (ref 1.7–7.7)
Neutrophils Relative %: 72 %

## 2020-05-17 LAB — APTT
aPTT: 33 seconds (ref 24–36)
aPTT: 45 seconds — ABNORMAL HIGH (ref 24–36)

## 2020-05-17 LAB — TROPONIN I (HIGH SENSITIVITY)
Troponin I (High Sensitivity): 56 ng/L — ABNORMAL HIGH (ref <18)
Troponin I (High Sensitivity): 53 ng/L — ABNORMAL HIGH (ref <18)

## 2020-05-17 LAB — CBC
HCT: 37 % (ref 36.0–46.0)
Hemoglobin: 11.1 g/dL — ABNORMAL LOW (ref 12.0–15.0)
MCH: 30.3 pg (ref 26.0–34.0)
MCHC: 30 g/dL (ref 30.0–36.0)
MCV: 101.1 fL — ABNORMAL HIGH (ref 80.0–100.0)
Platelets: 306 10*3/uL (ref 150–400)
RBC: 3.66 MIL/uL — ABNORMAL LOW (ref 3.87–5.11)
RDW: 18.1 % — ABNORMAL HIGH (ref 11.5–15.5)
WBC: 7.7 10*3/uL (ref 4.0–10.5)
nRBC: 0 % (ref 0.0–0.2)

## 2020-05-17 LAB — COMPREHENSIVE METABOLIC PANEL
ALT: 10 U/L (ref 0–44)
AST: 16 U/L (ref 15–41)
Albumin: 3.1 g/dL — ABNORMAL LOW (ref 3.5–5.0)
Alkaline Phosphatase: 112 U/L (ref 38–126)
Anion gap: 16 — ABNORMAL HIGH (ref 5–15)
BUN: 34 mg/dL — ABNORMAL HIGH (ref 8–23)
CO2: 18 mmol/L — ABNORMAL LOW (ref 22–32)
Calcium: 9.3 mg/dL (ref 8.9–10.3)
Chloride: 108 mmol/L (ref 98–111)
Creatinine, Ser: 2.25 mg/dL — ABNORMAL HIGH (ref 0.44–1.00)
GFR calc Af Amer: 26 mL/min — ABNORMAL LOW (ref >60)
GFR calc non Af Amer: 22 mL/min — ABNORMAL LOW (ref >60)
Glucose, Bld: 111 mg/dL — ABNORMAL HIGH (ref 70–99)
Potassium: 3.9 mmol/L (ref 3.5–5.1)
Sodium: 142 mmol/L (ref 135–145)
Total Bilirubin: 0.8 mg/dL (ref 0.3–1.2)
Total Protein: 6.1 g/dL — ABNORMAL LOW (ref 6.5–8.1)

## 2020-05-17 LAB — I-STAT CHEM 8, ED
BUN: 33 mg/dL — ABNORMAL HIGH (ref 8–23)
Calcium, Ion: 1.23 mmol/L (ref 1.15–1.40)
Chloride: 114 mmol/L — ABNORMAL HIGH (ref 98–111)
Creatinine, Ser: 2.3 mg/dL — ABNORMAL HIGH (ref 0.44–1.00)
Glucose, Bld: 100 mg/dL — ABNORMAL HIGH (ref 70–99)
HCT: 35 % — ABNORMAL LOW (ref 36.0–46.0)
Hemoglobin: 11.9 g/dL — ABNORMAL LOW (ref 12.0–15.0)
Potassium: 3.7 mmol/L (ref 3.5–5.1)
Sodium: 142 mmol/L (ref 135–145)
TCO2: 21 mmol/L — ABNORMAL LOW (ref 22–32)

## 2020-05-17 LAB — PROTIME-INR
INR: 1.7 — ABNORMAL HIGH (ref 0.8–1.2)
Prothrombin Time: 19.8 seconds — ABNORMAL HIGH (ref 11.4–15.2)

## 2020-05-17 LAB — MAGNESIUM: Magnesium: 1.9 mg/dL (ref 1.7–2.4)

## 2020-05-17 LAB — CBG MONITORING, ED: Glucose-Capillary: 111 mg/dL — ABNORMAL HIGH (ref 70–99)

## 2020-05-17 LAB — HEMOGLOBIN A1C
Hgb A1c MFr Bld: 6.4 % — ABNORMAL HIGH (ref 4.8–5.6)
Mean Plasma Glucose: 136.98 mg/dL

## 2020-05-17 LAB — BRAIN NATRIURETIC PEPTIDE: B Natriuretic Peptide: 4476.9 pg/mL — ABNORMAL HIGH (ref 0.0–100.0)

## 2020-05-17 LAB — GLUCOSE, CAPILLARY
Glucose-Capillary: 127 mg/dL — ABNORMAL HIGH (ref 70–99)
Glucose-Capillary: 187 mg/dL — ABNORMAL HIGH (ref 70–99)

## 2020-05-17 LAB — TSH: TSH: 1.71 u[IU]/mL (ref 0.350–4.500)

## 2020-05-17 LAB — SARS CORONAVIRUS 2 BY RT PCR (HOSPITAL ORDER, PERFORMED IN ~~LOC~~ HOSPITAL LAB): SARS Coronavirus 2: NEGATIVE

## 2020-05-17 MED ORDER — FUROSEMIDE 10 MG/ML IJ SOLN
40.0000 mg | Freq: Once | INTRAMUSCULAR | Status: AC
  Administered 2020-05-17: 40 mg via INTRAVENOUS
  Filled 2020-05-17: qty 4

## 2020-05-17 MED ORDER — ALPRAZOLAM 0.5 MG PO TABS: 1.0000 mg | ORAL_TABLET | Freq: Three times a day (TID) | ORAL | Status: DC | PRN

## 2020-05-17 MED ORDER — FUROSEMIDE 10 MG/ML IJ SOLN
40.0000 mg | Freq: Every day | INTRAMUSCULAR | Status: DC
  Administered 2020-05-18 – 2020-05-20 (×4): 40 mg via INTRAVENOUS
  Filled 2020-05-17 (×5): qty 4

## 2020-05-17 MED ORDER — SODIUM CHLORIDE 0.9% FLUSH: 3.0000 mL | INTRAVENOUS | Status: DC | PRN

## 2020-05-17 MED ORDER — PANTOPRAZOLE SODIUM 40 MG PO TBEC
40.0000 mg | DELAYED_RELEASE_TABLET | Freq: Two times a day (BID) | ORAL | Status: DC
  Administered 2020-05-17 – 2020-05-21 (×8): 40 mg via ORAL
  Filled 2020-05-17 (×2): qty 1
  Filled 2020-05-17: qty 2
  Filled 2020-05-17 (×5): qty 1

## 2020-05-17 MED ORDER — ACETAMINOPHEN 325 MG PO TABS
650.0000 mg | ORAL_TABLET | ORAL | Status: DC | PRN
  Administered 2020-05-18 – 2020-05-20 (×3): 650 mg via ORAL
  Filled 2020-05-17 (×3): qty 2

## 2020-05-17 MED ORDER — ALPRAZOLAM 0.5 MG PO TABS
0.5000 mg | ORAL_TABLET | Freq: Three times a day (TID) | ORAL | Status: DC | PRN
  Administered 2020-05-17 – 2020-05-21 (×11): 0.5 mg via ORAL
  Filled 2020-05-17 (×11): qty 1

## 2020-05-17 MED ORDER — ALBUTEROL SULFATE HFA 108 (90 BASE) MCG/ACT IN AERS
8.0000 | INHALATION_SPRAY | Freq: Once | RESPIRATORY_TRACT | Status: AC
  Administered 2020-05-17: 8 via RESPIRATORY_TRACT
  Filled 2020-05-17: qty 6.7

## 2020-05-17 MED ORDER — ONDANSETRON HCL 4 MG/2ML IJ SOLN
4.0000 mg | Freq: Four times a day (QID) | INTRAMUSCULAR | Status: DC | PRN
  Administered 2020-05-17 – 2020-05-18 (×3): 4 mg via INTRAVENOUS
  Filled 2020-05-17 (×3): qty 2

## 2020-05-17 MED ORDER — SODIUM CHLORIDE 0.9 % IV SOLN: 250.0000 mL | INTRAVENOUS | Status: DC | PRN

## 2020-05-17 MED ORDER — SODIUM CHLORIDE 0.9% FLUSH
3.0000 mL | Freq: Once | INTRAVENOUS | Status: DC
Start: 2020-05-17 — End: 2020-05-21

## 2020-05-17 MED ORDER — HEPARIN (PORCINE) 25000 UT/250ML-% IV SOLN
1050.0000 [IU]/h | INTRAVENOUS | Status: DC
  Administered 2020-05-17: 850 [IU]/h via INTRAVENOUS
  Filled 2020-05-17: qty 250

## 2020-05-17 MED ORDER — ALPRAZOLAM 0.25 MG PO TABS
0.5000 mg | ORAL_TABLET | Freq: Once | ORAL | Status: AC
  Administered 2020-05-17: 0.5 mg via ORAL
  Filled 2020-05-17: qty 2

## 2020-05-17 MED ORDER — FERROUS SULFATE 325 (65 FE) MG PO TABS
325.0000 mg | ORAL_TABLET | Freq: Every day | ORAL | Status: DC
  Administered 2020-05-18 – 2020-05-21 (×4): 325 mg via ORAL
  Filled 2020-05-17 (×5): qty 1

## 2020-05-17 MED ORDER — ASPIRIN EC 81 MG PO TBEC
81.0000 mg | DELAYED_RELEASE_TABLET | Freq: Every day | ORAL | Status: DC
  Administered 2020-05-17 – 2020-05-21 (×5): 81 mg via ORAL
  Filled 2020-05-17 (×5): qty 1

## 2020-05-17 MED ORDER — POTASSIUM CHLORIDE CRYS ER 10 MEQ PO TBCR
10.0000 meq | EXTENDED_RELEASE_TABLET | Freq: Every day | ORAL | Status: DC
  Administered 2020-05-17 – 2020-05-19 (×3): 10 meq via ORAL
  Filled 2020-05-17 (×4): qty 1

## 2020-05-17 MED ORDER — NITROGLYCERIN 2 % TD OINT
0.5000 [in_us] | TOPICAL_OINTMENT | Freq: Three times a day (TID) | TRANSDERMAL | Status: DC
  Administered 2020-05-17 – 2020-05-21 (×12): 0.5 [in_us] via TOPICAL
  Filled 2020-05-17: qty 30

## 2020-05-17 MED ORDER — INSULIN ASPART 100 UNIT/ML ~~LOC~~ SOLN
0.0000 [IU] | Freq: Three times a day (TID) | SUBCUTANEOUS | Status: DC
  Administered 2020-05-18 – 2020-05-20 (×6): 1 [IU] via SUBCUTANEOUS

## 2020-05-17 MED ORDER — NITROGLYCERIN 0.4 MG SL SUBL: 0.4000 mg | SUBLINGUAL_TABLET | SUBLINGUAL | Status: DC | PRN

## 2020-05-17 MED ORDER — SERTRALINE HCL 100 MG PO TABS
100.0000 mg | ORAL_TABLET | Freq: Every day | ORAL | Status: DC
  Administered 2020-05-17 – 2020-05-20 (×4): 100 mg via ORAL
  Filled 2020-05-17 (×5): qty 1

## 2020-05-17 MED ORDER — ALLOPURINOL 100 MG PO TABS
100.0000 mg | ORAL_TABLET | Freq: Every day | ORAL | Status: DC
  Administered 2020-05-17 – 2020-05-21 (×5): 100 mg via ORAL
  Filled 2020-05-17 (×5): qty 1

## 2020-05-17 MED ORDER — METOPROLOL SUCCINATE ER 25 MG PO TB24
25.0000 mg | ORAL_TABLET | Freq: Every day | ORAL | Status: DC
  Administered 2020-05-17 – 2020-05-21 (×5): 25 mg via ORAL
  Filled 2020-05-17 (×5): qty 1

## 2020-05-17 MED ORDER — SODIUM CHLORIDE 0.9% FLUSH
3.0000 mL | Freq: Two times a day (BID) | INTRAVENOUS | Status: DC
  Administered 2020-05-17 – 2020-05-21 (×9): 3 mL via INTRAVENOUS

## 2020-05-17 NOTE — ED Notes (Signed)
assistedto BSC

## 2020-05-17 NOTE — Progress Notes (Addendum)
[place] for Heparin Indication: atrial fibrillation  Allergies  Allergen Reactions  . Amoxicillin-Pot Clavulanate Diarrhea    Has patient had a PCN reaction causing immediate rash, facial/tongue/throat swelling, SOB or lightheadedness with hypotension: Unknown Has patient had a PCN reaction causing severe rash involving mucus membranes or skin necrosis:UnknoWN Has patient had a PCN reaction that required hospitalization: Unknown Has patient had a PCN reaction occurring within the last 10 years: Unknown If all of the above answers are "NO", then may proceed with Cephalosporin use.   . Bupropion Nausea Only  . Nicotine Nausea Only and Rash    Reaction to patches    Patient Measurements: Height: 5\' 3"  (160 cm) Weight: 75.4 kg (166 lb 3.2 oz) IBW/kg (Calculated) : 52.4 Heparin Dosing Weight: 69.1 kg  Vital Signs: Temp: 98.7 F (37.1 C) (05/23 1727) Temp Source: Oral (05/23 1727) BP: 157/127 (05/23 1727) Pulse Rate: 72 (05/23 1727)  Labs: Recent Labs    05/17/20 0100 05/17/20 0135 05/17/20 0534 05/17/20 0718 05/17/20 1734  HGB 11.1* 11.9*  --   --   --   HCT 37.0 35.0*  --   --   --   PLT 306  --   --   --   --   APTT 33  --   --   --  45*  LABPROT 19.8*  --   --   --   --   INR 1.7*  --   --   --   --   CREATININE 2.25* 2.30*  --   --   --   TROPONINIHS  --   --  56* 53*  --     Estimated Creatinine Clearance: 24 mL/min (A) (by C-G formula based on SCr of 2.3 mg/dL (H)).  Assessment: Patient is a 51 yof that presents to the ED with increased leg swelling. Patient is being admitted for CHF exacerbation. On Apixaban PTA for afib. Converted to IV hep on admit  Last dose of Apixaban was at 2000 on 5/22  Initial aPTT is low at 45  Goal of Therapy:  aPTT 66-102 seconds Monitor platelets by anticoagulation protocol: Yes   Plan:  Increase heparin gtt to 1050 units/hr aptt and hep lvl at [place], PharmD, BCPS,  BCCCP Clinical Pharmacist [phone]  Please check AMION for all [place] numbers  05/17/2020 7:06 PM   9:38 PM Addendum:  RN Called for concern for significant bleeding from laceration that occurred PTA this am. She paused heparin pending plan. Discussed with Dr Terrence Dupont  Plan: Hold heparin  Decision to resume will be made in am.

## 2020-05-17 NOTE — ED Provider Notes (Signed)
[name] EMERGENCY DEPARTMENT Provider Note   CSN: [id number] Arrival date & time: 05/17/20  0046     History Chief Complaint  Patient presents with  . Leg Swelling    Renee Griffin is a 64 y.o. female.  The history is provided by the patient.  Shortness of Breath Severity:  Moderate Onset quality:  Sudden Duration: awoke this evening  Timing:  Constant Progression:  Unchanged Chronicity:  New Context: not activity and not URI   Relieved by:  Nothing Worsened by:  Nothing Ineffective treatments:  None tried Associated symptoms: PND   Associated symptoms: no abdominal pain, no chest pain, no claudication, no cough, no diaphoresis, no ear pain, no fever, no headaches, no hemoptysis, no neck pain, no sore throat, no sputum production, no syncope, no swollen glands, no vomiting and no wheezing   Associated symptoms comment:  Lower extremity edema for several days.  Risk factors: no recent alcohol use        Past Medical History:  Diagnosis Date  . Depression   . Diabetes mellitus without complication ([place])   . Heart disease, congenital   . High cholesterol   . Hypertension   . Panic attack   . Stroke [facility])     Patient Active Problem List   Diagnosis Date Noted  . NSTEMI (non-ST elevated myocardial infarction) ([place]) 02/07/2020  . Acute systolic heart failure ([place]) 01/19/2020  . Acute lower GI bleeding 12/16/2018  . Dehydration 04/13/2018  . Ovarian mass, left   . Abdominal pain 02/01/2018  . Essential hypertension 03/04/2016  . HLD (hyperlipidemia) 03/04/2016  . Type 2 diabetes mellitus with circulatory disorder ([place]) 03/04/2016  . Obesity 03/04/2016  . CVA (cerebral vascular accident) ([place]) 03/05/2015  . Bilateral low back pain without sciatica 03/05/2015  . Essential hypertension, benign 03/05/2015  . Tobacco use disorder 03/05/2015  . Hyperlipidemia 03/05/2015  . Hyperparathyroidism, primary ([place]) 02/13/2013  . Generalized ischemic  cerebrovascular disease 02/06/2013  . Hyperreflexia 02/06/2013  . Abnormality of gait 02/06/2013  . Disturbance of skin sensation 02/06/2013  . CVA (cerebral infarction) 06/03/2012  . HTN (hypertension) 06/03/2012  . Dyslipidemia 06/03/2012  . Anxiety 06/03/2012    Past Surgical History:  Procedure Laterality Date  . CARDIAC SURGERY  2011   CABG  . COLONOSCOPY WITH PROPOFOL N/A 02/08/2018   Procedure: COLONOSCOPY WITH PROPOFOL;  Surgeon: Otis Brace, MD;  Location: [facility] ENDOSCOPY;  Service: Gastroenterology;  Laterality: N/A;  . COLONOSCOPY WITH PROPOFOL N/A 12/16/2018   Procedure: COLONOSCOPY WITH PROPOFOL;  Surgeon: Ronald Lobo, MD;  Location: [facility] ENDOSCOPY;  Service: Endoscopy;  Laterality: N/A;  . DILATION AND EVACUATION     Patient states she had an abortion 30 or 40 years ago  . ESOPHAGOGASTRODUODENOSCOPY (EGD) WITH PROPOFOL Left 02/05/2018   Procedure: ESOPHAGOGASTRODUODENOSCOPY (EGD) WITH PROPOFOL;  Surgeon: Otis Brace, MD;  Location: [facility] ENDOSCOPY;  Service: Gastroenterology;  Laterality: Left;  . IR ANGIOGRAM SELECTIVE EACH ADDITIONAL VESSEL  12/18/2018  . IR ANGIOGRAM SELECTIVE EACH ADDITIONAL VESSEL  12/18/2018  . IR ANGIOGRAM SELECTIVE EACH ADDITIONAL VESSEL  12/18/2018  . IR ANGIOGRAM SELECTIVE EACH ADDITIONAL VESSEL  12/18/2018  . IR ANGIOGRAM VISCERAL SELECTIVE  12/18/2018  . IR EMBO ART  VEN HEMORR LYMPH EXTRAV  INC GUIDE ROADMAPPING  12/18/2018  . IR US GUIDE VASC ACCESS RIGHT  12/18/2018  . LEFT HEART CATH AND CORONARY ANGIOGRAPHY N/A 02/10/2020   Procedure: LEFT HEART CATH AND CORONARY ANGIOGRAPHY;  Surgeon: Dixie Dials, MD;  Location: [facility]  INVASIVE CV LAB;  Service: Cardiovascular;  Laterality: N/A;  . WISDOM TOOTH EXTRACTION       OB History    Gravida  1   Para      Term      Preterm      AB  1   Living  0     SAB      TAB  1   Ectopic      Multiple      Live Births              Family History  Problem Relation Age of  Onset  . Cancer Mother        uterian OR cervical  . Cancer Father        lung  . Leukemia Brother   . Cancer Sister        breast    Social History   Tobacco Use  . Smoking status: Current Every Day Smoker    Packs/day: 1.00    Years: 50.00    Pack years: 50.00    Types: Cigarettes  . Smokeless tobacco: Never Used  Substance Use Topics  . Alcohol use: No    Alcohol/week: 0.0 standard drinks  . Drug use: No    Home Medications Prior to Admission medications   Medication Sig Start Date End Date Taking? Authorizing Provider  ACCU-CHEK AVIVA PLUS test strip See admin instructions. 01/29/16   [provider]  ACCU-CHEK SOFTCLIX LANCETS lancets See admin instructions. 01/30/16   [provider]  acetaminophen (TYLENOL) 325 MG tablet Take 2 tablets (650 mg total) by mouth every 4 (four) hours as needed for headache or mild pain. 01/22/20   Dixie Dials, MD  albuterol (VENTOLIN HFA) 108 (90 Base) MCG/ACT inhaler Inhale 2 puffs into the lungs every 6 (six) hours as needed for wheezing or shortness of breath. 01/22/20   Dixie Dials, MD  allopurinol (ZYLOPRIM) 100 MG tablet Take 1 tablet (100 mg total) by mouth daily. 01/23/20   Dixie Dials, MD  ALPRAZolam Duanne Moron) 0.5 MG tablet Take 1 tablet (0.5 mg total) by mouth 2 (two) times daily as needed for anxiety. 02/24/20   Virgel Manifold, MD  apixaban (ELIQUIS) 2.5 MG TABS tablet Take 1 tablet (2.5 mg total) by mouth 2 (two) times daily. 01/22/20   Dixie Dials, MD  aspirin EC 81 MG tablet Take 1 tablet (81 mg total) by mouth daily. 01/22/20 01/21/21  Dixie Dials, MD  Blood Glucose Monitoring Suppl (ACCU-CHEK AVIVA PLUS) w/Device KIT See admin instructions. 01/29/16   [provider]  cholecalciferol (VITAMIN D3) 25 MCG (1000 UT) tablet Take 1,000 Units by mouth daily.    [provider]  diltiazem (CARDIZEM) 60 MG tablet Take 1 tablet (60 mg total) by mouth 2 (two) times daily. 02/14/20   Dixie Dials, MD    diphenhydrAMINE (BENADRYL) 50 MG tablet Take 50 mg by mouth at bedtime.    [provider]  ferrous sulfate 325 (65 FE) MG tablet Take 1 tablet (325 mg total) by mouth daily with breakfast. 02/15/20   Dixie Dials, MD  furosemide (LASIX) 40 MG tablet Take 1 tablet (40 mg total) by mouth daily. 01/23/20   Dixie Dials, MD  gemfibrozil (LOPID) 600 MG tablet Take 600 mg by mouth 2 (two) times daily. 02/19/18   [provider]  loperamide (IMODIUM) 2 MG capsule Take 1 capsule (2 mg total) by mouth 4 (four) times daily as needed for diarrhea or loose stools.  Patient taking differently: Take 2 mg by mouth daily as needed for diarrhea or loose stools.  02/24/19   Julianne Rice, MD  metoprolol tartrate (LOPRESSOR) 25 MG tablet Take 0.5 tablets (12.5 mg total) by mouth 2 (two) times daily. 12/25/18   Dixie Dials, MD  Multiple Vitamin (MULTIVITAMIN WITH MINERALS) TABS tablet Take 1 tablet by mouth daily.    [provider]  nitroGLYCERIN (NITROSTAT) 0.4 MG SL tablet Place 1 tablet (0.4 mg total) under the tongue every 5 (five) minutes x 3 doses as needed for chest pain. 02/14/20   Dixie Dials, MD  ondansetron (ZOFRAN) 4 MG tablet Take 1 tablet (4 mg total) by mouth every 6 (six) hours as needed for nausea or vomiting. 02/24/19   Julianne Rice, MD  oxymetazoline (AFRIN) 0.05 % nasal spray Place 1 spray into both nostrils 2 (two) times daily.    [provider]  pantoprazole (PROTONIX) 40 MG tablet Take 1 tablet (40 mg total) by mouth 2 (two) times daily. Patient taking differently: Take 40 mg by mouth 2 (two) times daily as needed (heartburn).  02/09/18   Dixie Dials, MD  prazosin (MINIPRESS) 1 MG capsule Take 1 capsule (1 mg total) by mouth 2 (two) times daily. 02/09/18   Dixie Dials, MD  promethazine (PHENERGAN) 25 MG tablet Take 1 tablet (25 mg total) by mouth every 6 (six) hours as needed for nausea or vomiting. 02/24/20   Virgel Manifold, MD  sertraline (ZOLOFT) 100  MG tablet Take 100 mg by mouth at bedtime.     [provider]  tiZANidine (ZANAFLEX) 2 MG tablet Take 1 tablet (2 mg total) by mouth at bedtime as needed for muscle spasms. Patient taking differently: Take 2 mg by mouth at bedtime.  12/25/18   Dixie Dials, MD  valACYclovir (VALTREX) 500 MG tablet Take 500 mg by mouth daily.    [provider]    Allergies    Amoxicillin-pot clavulanate, Bupropion, and Nicotine  Review of Systems   Review of Systems  Constitutional: Negative for diaphoresis and fever.  HENT: Negative for ear pain and sore throat.   Eyes: Negative for visual disturbance.  Respiratory: Positive for shortness of breath. Negative for cough, hemoptysis, sputum production and wheezing.   Cardiovascular: Positive for leg swelling and PND. Negative for chest pain, palpitations, claudication and syncope.  Gastrointestinal: Negative for abdominal pain and vomiting.  Genitourinary: Negative for difficulty urinating.  Musculoskeletal: Negative for arthralgias and neck pain.  Neurological: Negative for speech difficulty, weakness, numbness and headaches.  Psychiatric/Behavioral: Negative for agitation.  All other systems reviewed and are negative.   Physical Exam Updated Vital Signs BP (!) 131/94   Pulse 83   Temp 98.6 F (37 C) (Oral)   Resp 19   SpO2 93%   Physical Exam Vitals and nursing note reviewed.  Constitutional:      General: She is not in acute distress.    Appearance: Normal appearance.  HENT:     Head: Normocephalic and atraumatic.     Nose: Nose normal.  Eyes:     Pupils: Pupils are equal, round, and reactive to light.  Cardiovascular:     Rate and Rhythm: Normal rate and regular rhythm.     Pulses: Normal pulses.     Heart sounds: Normal heart sounds.  Pulmonary:     Breath sounds: Wheezing and rales present.  Abdominal:     General: Abdomen is flat. Bowel sounds are normal.     Tenderness: There  is no abdominal tenderness.  There is no guarding.  Musculoskeletal:     Right lower leg: Edema present.     Left lower leg: Edema present.  Skin:    General: Skin is warm and dry.     Capillary Refill: Capillary refill takes less than 2 seconds.  Neurological:     General: No focal deficit present.     Mental Status: She is alert and oriented to person, place, and time.     Deep Tendon Reflexes: Reflexes normal.  Psychiatric:        Mood and Affect: Mood normal.        Behavior: Behavior normal.     ED Results / Procedures / Treatments   Labs (all labs ordered are listed, but only abnormal results are displayed) Labs Reviewed  PROTIME-INR - Abnormal; Notable for the following components:      Result Value   Prothrombin Time 19.8 (*)    INR 1.7 (*)    All other components within normal limits  CBC - Abnormal; Notable for the following components:   RBC 3.66 (*)    Hemoglobin 11.1 (*)    MCV 101.1 (*)    RDW 18.1 (*)    All other components within normal limits  COMPREHENSIVE METABOLIC PANEL - Abnormal; Notable for the following components:   CO2 18 (*)    Glucose, Bld 111 (*)    BUN 34 (*)    Creatinine, Ser 2.25 (*)    Total Protein 6.1 (*)    Albumin 3.1 (*)    GFR calc non Af Amer 22 (*)    GFR calc Af Amer 26 (*)    Anion gap 16 (*)    All other components within normal limits  BRAIN NATRIURETIC PEPTIDE - Abnormal; Notable for the following components:   B Natriuretic Peptide 4,476.9 (*)    All other components within normal limits  I-STAT CHEM 8, ED - Abnormal; Notable for the following components:   Chloride 114 (*)    BUN 33 (*)    Creatinine, Ser 2.30 (*)    Glucose, Bld 100 (*)    TCO2 21 (*)    Hemoglobin 11.9 (*)    HCT 35.0 (*)    All other components within normal limits  CBG MONITORING, ED - Abnormal; Notable for the following components:   Glucose-Capillary 111 (*)    All other components within normal limits  TROPONIN I (HIGH SENSITIVITY) - Abnormal; Notable for the  following components:   Troponin I (High Sensitivity) 56 (*)    All other components within normal limits  SARS CORONAVIRUS 2 BY RT PCR (HOSPITAL ORDER, [facility])  APTT  DIFFERENTIAL  TROPONIN I (HIGH SENSITIVITY)    EKG EKG Interpretation  Date/Time:  Sunday May 17 2020 05:27:40 EDT Ventricular Rate:  91 PR Interval:    QRS Duration: 148 QT Interval:  327 QTC Calculation: 403 R Axis:   116 Text Interpretation: Sinus tachycardia Supraventricular bigeminy Nonspecific intraventricular conduction delay Borderline repol abnormality, lateral leads Confirmed by Randal Buba, Jorgina Binning ([id number]) on 05/17/2020 6:42:08 AM   Radiology DG Chest Portable 1 View  Result Date: 05/17/2020 CLINICAL DATA:  Initial evaluation for acute shortness of breath. EXAM: PORTABLE CHEST 1 VIEW COMPARISON:  Prior radiograph from 02/24/2020. FINDINGS: Median sternotomy wires underlying surgical clips noted. Stable cardiomegaly. Mediastinal silhouette within normal limits. Lungs mildly hypoinflated. Mild diffuse interstitial prominence, suggesting pulmonary interstitial congestion/edema. No visible pleural effusion. No consolidative opacity  or focal infiltrate. No pneumothorax. No acute osseous finding. Surgical clips overlie the central and right upper chest. IMPRESSION: 1. Cardiomegaly with mild diffuse pulmonary interstitial congestion. No frank alveolar edema. 2. No other active cardiopulmonary disease. Electronically Signed   By: Jeannine Boga M.D.   On: 05/17/2020 05:54    Procedures Procedures (including critical care time)  Medications Ordered in ED Medications  sodium chloride flush (NS) 0.9 % injection 3 mL (0 mLs Intravenous Hold 05/17/20 0442)  furosemide (LASIX) injection 40 mg (40 mg Intravenous Given 05/17/20 0613)  albuterol (VENTOLIN HFA) 108 (90 Base) MCG/ACT inhaler 8 puff (8 puffs Inhalation Given 05/17/20 8873)    ED Course  I have reviewed the triage vital signs and  the nursing notes.  Pertinent labs & imaging results that were available during my care of the patient were reviewed by me and considered in my medical decision making (see chart for details).     Final Clinical Impression(s) / ED Diagnoses Final diagnoses:  Acute pulmonary edema ([place])    Will admit. Hypoxia, shortness of breath anasarca.     Charmane Protzman, MD 05/17/20 [phone]

## 2020-05-17 NOTE — ED Notes (Signed)
Pt assisted to BSC and back to bed 

## 2020-05-17 NOTE — H&P (Signed)
Renee Griffin is an 64 y.o. female.   Chief Complaint: Progressive increasing shortness of breath associated with leg swelling HPI: Patient is 64 year old female with past medical history significant for multiple medical problems i.e. coronary artery disease status post CABG, history of aortic stenosis status post bioprosthetic aortic valve, status post non-STEMI approximately 3 months ago noted to have critical proximal stenosis in the saphenous vein graft to RCA treated medically hypertension, diabetes mellitus, hyperlipidemia, chronic atrial fibrillation on chronic anticoagulation, chronic kidney disease stage III anxiety disorder, history of GI bleed in the past, obesity, came to ER by EMS complaining of progressive increasing shortness of breath associated with leg swelling since yesterday.  Patient gives history of PND orthopnea.  Denies any chest pain nausea vomiting diaphoresis.  Denies palpitation lightheadedness syncope.  EKG done in the ED showed A. fib with moderate ventricular response septal Q waves and minor ST-T wave changes in inferolateral leads.  Patient was noted to have markedly elevated BNP and minimally elevated high-sensitivity troponin IX which was attributed to heart failure and chronic kidney disease.  Received IV Lasix with good diuresis states breathing has improved.  Patient denies any cough fever chills.  States has received Covid vaccine in the past.  Past Medical History:  Diagnosis Date  . Depression   . Diabetes mellitus without complication ([place])   . Heart disease, congenital   . High cholesterol   . Hypertension   . Panic attack   . Stroke [facility])     Past Surgical History:  Procedure Laterality Date  . CARDIAC SURGERY  2011   CABG  . COLONOSCOPY WITH PROPOFOL N/A 02/08/2018   Procedure: COLONOSCOPY WITH PROPOFOL;  Surgeon: Otis Brace, MD;  Location: [facility];  Service: Gastroenterology;  Laterality: N/A;  . COLONOSCOPY WITH PROPOFOL N/A 12/16/2018    Procedure: COLONOSCOPY WITH PROPOFOL;  Surgeon: Ronald Lobo, MD;  Location: [facility];  Service: Endoscopy;  Laterality: N/A;  . DILATION AND EVACUATION     Patient states she had an abortion 30 or 40 years ago  . ESOPHAGOGASTRODUODENOSCOPY (EGD) WITH PROPOFOL Left 02/05/2018   Procedure: ESOPHAGOGASTRODUODENOSCOPY (EGD) WITH PROPOFOL;  Surgeon: Otis Brace, MD;  Location: [facility];  Service: Gastroenterology;  Laterality: Left;  . IR ANGIOGRAM SELECTIVE EACH ADDITIONAL VESSEL  12/18/2018  . IR ANGIOGRAM SELECTIVE EACH ADDITIONAL VESSEL  12/18/2018  . IR ANGIOGRAM SELECTIVE EACH ADDITIONAL VESSEL  12/18/2018  . IR ANGIOGRAM SELECTIVE EACH ADDITIONAL VESSEL  12/18/2018  . IR ANGIOGRAM VISCERAL SELECTIVE  12/18/2018  . IR EMBO ART  VEN HEMORR LYMPH EXTRAV  INC GUIDE ROADMAPPING  12/18/2018  . IR US GUIDE VASC ACCESS RIGHT  12/18/2018  . LEFT HEART CATH AND CORONARY ANGIOGRAPHY N/A 02/10/2020   Procedure: LEFT HEART CATH AND CORONARY ANGIOGRAPHY;  Surgeon: Dixie Dials, MD;  Location: [facility];  Service: Cardiovascular;  Laterality: N/A;  . WISDOM TOOTH EXTRACTION      Family History  Problem Relation Age of Onset  . Cancer Mother        uterian OR cervical  . Cancer Father        lung  . Leukemia Brother   . Cancer Sister        breast   Social History:  reports that she has been smoking cigarettes. She has a 50.00 pack-year smoking history. She has never used smokeless tobacco. She reports that she does not drink alcohol or use drugs.  Allergies:  Allergies  Allergen Reactions  . Amoxicillin-Pot Clavulanate  Diarrhea    Has patient had a PCN reaction causing immediate rash, facial/tongue/throat swelling, SOB or lightheadedness with hypotension: Unknown Has patient had a PCN reaction causing severe rash involving mucus membranes or skin necrosis:UnknoWN Has patient had a PCN reaction that required hospitalization: Unknown Has patient had a PCN reaction  occurring within the last 10 years: Unknown If all of the above answers are "NO", then may proceed with Cephalosporin use.   . Bupropion Nausea Only  . Nicotine Nausea Only and Rash    Reaction to patches    (Not in a hospital admission)   Results for orders placed or performed during the hospital encounter of 05/17/20 (from the past 48 hour(s))  Protime-INR     Status: Abnormal   Collection Time: 05/17/20  1:00 AM  Result Value Ref Range   Prothrombin Time 19.8 (H) 11.4 - 15.2 seconds   INR 1.7 (H) 0.8 - 1.2    Comment: (NOTE) INR goal varies based on device and disease states. Performed at [facility], [place] [address], [place], [place] [address]   APTT     Status: None   Collection Time: 05/17/20  1:00 AM  Result Value Ref Range   aPTT 33 24 - 36 seconds    Comment: Performed at [facility] [address], [place], [place] [address]  CBC     Status: Abnormal   Collection Time: 05/17/20  1:00 AM  Result Value Ref Range   WBC 7.7 4.0 - 10.5 K/uL   RBC 3.66 (L) 3.87 - 5.11 MIL/uL   Hemoglobin 11.1 (L) 12.0 - 15.0 g/dL   HCT 37.0 36.0 - 46.0 %   MCV 101.1 (H) 80.0 - 100.0 fL   MCH 30.3 26.0 - 34.0 pg   MCHC 30.0 30.0 - 36.0 g/dL   RDW 18.1 (H) 11.5 - 15.5 %   Platelets 306 150 - 400 K/uL   nRBC 0.0 0.0 - 0.2 %    Comment: Performed at [facility], [place] [address], [place], [place] [address]  Differential     Status: None   Collection Time: 05/17/20  1:00 AM  Result Value Ref Range   Neutrophils Relative % 72 %   Neutro Abs 5.5 1.7 - 7.7 K/uL   Lymphocytes Relative 16 %   Lymphs Abs 1.2 0.7 - 4.0 K/uL   Monocytes Relative 7 %   Monocytes Absolute 0.6 0.1 - 1.0 K/uL   Eosinophils Relative 3 %   Eosinophils Absolute 0.2 0.0 - 0.5 K/uL   Basophils Relative 1 %   Basophils Absolute 0.1 0.0 - 0.1 K/uL   Immature Granulocytes 1 %   Abs Immature Granulocytes 0.04 0.00 - 0.07 K/uL    Comment: Performed at [facility] [address]  [place], [place] [address]  Comprehensive metabolic panel     Status: Abnormal   Collection Time: 05/17/20  1:00 AM  Result Value Ref Range   Sodium 142 135 - 145 mmol/L   Potassium 3.9 3.5 - 5.1 mmol/L   Chloride 108 98 - 111 mmol/L   CO2 18 (L) 22 - 32 mmol/L   Glucose, Bld 111 (H) 70 - 99 mg/dL    Comment: Glucose reference range applies only to samples taken after fasting for at least 8 hours.   BUN 34 (H) 8 - 23 mg/dL   Creatinine, Ser 2.25 (H) 0.44 - 1.00 mg/dL   Calcium 9.3 8.9 - 10.3 mg/dL  Total Protein 6.1 (L) 6.5 - 8.1 g/dL   Albumin 3.1 (L) 3.5 - 5.0 g/dL   AST 16 15 - 41 U/L   ALT 10 0 - 44 U/L   Alkaline Phosphatase 112 38 - 126 U/L   Total Bilirubin 0.8 0.3 - 1.2 mg/dL   GFR calc non Af Amer 22 (L) >60 mL/min   GFR calc Af Amer 26 (L) >60 mL/min   Anion gap 16 (H) 5 - 15    Comment: Performed at [facility] [address] [place], [place] [address]  I-stat chem 8, ED     Status: Abnormal   Collection Time: 05/17/20  1:35 AM  Result Value Ref Range   Sodium 142 135 - 145 mmol/L   Potassium 3.7 3.5 - 5.1 mmol/L   Chloride 114 (H) 98 - 111 mmol/L   BUN 33 (H) 8 - 23 mg/dL   Creatinine, Ser 2.30 (H) 0.44 - 1.00 mg/dL   Glucose, Bld 100 (H) 70 - 99 mg/dL    Comment: Glucose reference range applies only to samples taken after fasting for at least 8 hours.   Calcium, Ion 1.23 1.15 - 1.40 mmol/L   TCO2 21 (L) 22 - 32 mmol/L   Hemoglobin 11.9 (L) 12.0 - 15.0 g/dL   HCT 35.0 (L) 36.0 - 46.0 %  CBG monitoring, ED     Status: Abnormal   Collection Time: 05/17/20  4:58 AM  Result Value Ref Range   Glucose-Capillary 111 (H) 70 - 99 mg/dL    Comment: Glucose reference range applies only to samples taken after fasting for at least 8 hours.   Comment 1 Notify RN    Comment 2 Document in Chart   Brain natriuretic peptide     Status: Abnormal   Collection Time: 05/17/20  5:34 AM  Result Value Ref Range   B Natriuretic Peptide 4,476.9 (H) 0.0 - 100.0 pg/mL    Comment:  Performed at [facility] [address], [place], [place] [address]  Troponin I (High Sensitivity)     Status: Abnormal   Collection Time: 05/17/20  5:34 AM  Result Value Ref Range   Troponin I (High Sensitivity) 56 (H) <18 ng/L    Comment: (NOTE) Elevated high sensitivity troponin I (hsTnI) values and significant  changes across serial measurements may suggest ACS but many other  chronic and acute conditions are known to elevate hsTnI results.  Refer to the "Links" section for chest pain algorithms and additional  guidance. Performed at [facility], [place] [address], [place], [place] [address]   SARS Coronavirus 2 by RT PCR (hospital order, performed in [facility] hospital lab) Nasopharyngeal Nasopharyngeal Swab     Status: None   Collection Time: 05/17/20  6:14 AM   Specimen: Nasopharyngeal Swab  Result Value Ref Range   SARS Coronavirus 2 NEGATIVE NEGATIVE    Comment: (NOTE) SARS-CoV-2 target nucleic acids are NOT DETECTED. The SARS-CoV-2 RNA is generally detectable in upper and lower respiratory specimens during the acute phase of infection. The lowest concentration of SARS-CoV-2 viral copies this assay can detect is 250 copies / mL. A negative result does not preclude SARS-CoV-2 infection and should not be used as the sole basis for treatment or other patient management decisions.  A negative result may occur with improper specimen collection / handling, submission of specimen other than nasopharyngeal swab, presence of viral mutation(s) within the areas targeted by this assay, and inadequate number of  viral copies (<250 copies / mL). A negative result must be combined with clinical observations, patient history, and epidemiological information. Fact Sheet for Patients:   StrictlyIdeas.no Fact Sheet for Healthcare Providers: BankingDealers.co.za This test is not yet approved or cleared  by the [place] FDA  and has been authorized for detection and/or diagnosis of SARS-CoV-2 by FDA under an Emergency Use Authorization (EUA).  This EUA will remain in effect (meaning this test can be used) for the duration of the COVID-19 declaration under Section 564(b)(1) of the Act, 21 U.S.C. section 360bbb-3(b)(1), unless the authorization is terminated or revoked sooner. Performed at [facility], [place] [address] [place], [place] [address]   Troponin I (High Sensitivity)     Status: Abnormal   Collection Time: 05/17/20  7:18 AM  Result Value Ref Range   Troponin I (High Sensitivity) 53 (H) <18 ng/L    Comment: (NOTE) Elevated high sensitivity troponin I (hsTnI) values and significant  changes across serial measurements may suggest ACS but many other  chronic and acute conditions are known to elevate hsTnI results.  Refer to the "Links" section for chest pain algorithms and additional  guidance. Performed at [facility], [place] [address], [place], [place] [address]    DG Chest Portable 1 View  Result Date: 05/17/2020 CLINICAL DATA:  Initial evaluation for acute shortness of breath. EXAM: PORTABLE CHEST 1 VIEW COMPARISON:  Prior radiograph from 02/24/2020. FINDINGS: Median sternotomy wires underlying surgical clips noted. Stable cardiomegaly. Mediastinal silhouette within normal limits. Lungs mildly hypoinflated. Mild diffuse interstitial prominence, suggesting pulmonary interstitial congestion/edema. No visible pleural effusion. No consolidative opacity or focal infiltrate. No pneumothorax. No acute osseous finding. Surgical clips overlie the central and right upper chest. IMPRESSION: 1. Cardiomegaly with mild diffuse pulmonary interstitial congestion. No frank alveolar edema. 2. No other active cardiopulmonary disease. Electronically Signed   By: Jeannine Boga M.D.   On: 05/17/2020 05:54    Review of Systems  Constitutional: Negative for diaphoresis.  HENT: Negative for sore throat.    Eyes: Negative for discharge.  Respiratory: Positive for shortness of breath.   Cardiovascular: Positive for leg swelling. Negative for palpitations.  Gastrointestinal: Negative for abdominal pain.  Endocrine: Negative for cold intolerance.  Musculoskeletal: Negative for arthralgias.  Neurological: Negative for dizziness.    Blood pressure (!) 136/97, pulse (!) 107, temperature 98.6 F (37 C), temperature source Oral, resp. rate (!) 25, SpO2 96 %. Physical Exam  Constitutional: She is oriented to person, place, and time.  HENT:  Head: Atraumatic.  Eyes: Pupils are equal, round, and reactive to light. Conjunctivae are normal. Left eye exhibits discharge. No scleral icterus.  Neck: JVD present.  Cardiovascular:  Irregularly irregular C1-Y6 soft 2/6 systolic and diastolic murmur and S3 gallop noted  Respiratory:  Decreased breath sounds at bases with faint Rales noted no wheezing noted now.  GI: Soft. Bowel sounds are normal. She exhibits no distension. There is no abdominal tenderness. There is no rebound and no guarding.  Musculoskeletal:     Cervical back: Normal range of motion.     Comments: No clubbing cyanosis 1+ edema noted  Neurological: She is alert and oriented to person, place, and time.     Assessment/Plan Acute decompensated congestive heart failure Valvular heart disease Multivessel CAD status post recent small non-Q wave MI status post CABG/AVR with bioprosthetic valve in the past Chronic atrial fibrillation Hypertension Diabetes mellitus Hyperlipidemia Chronic kidney disease stage III History of GI bleed in the  past Obesity Anxiety disorder Plan As per orders Dr. Doylene Canard will follow from a.m.  Charolette Forward, MD 05/17/2020, 9:32 AM

## 2020-05-17 NOTE — Progress Notes (Signed)
[facility] for Heparin Indication: atrial fibrillation  Allergies  Allergen Reactions  . Amoxicillin-Pot Clavulanate Diarrhea    Has patient had a PCN reaction causing immediate rash, facial/tongue/throat swelling, SOB or lightheadedness with hypotension: Unknown Has patient had a PCN reaction causing severe rash involving mucus membranes or skin necrosis:UnknoWN Has patient had a PCN reaction that required hospitalization: Unknown Has patient had a PCN reaction occurring within the last 10 years: Unknown If all of the above answers are "NO", then may proceed with Cephalosporin use.   . Bupropion Nausea Only  . Nicotine Nausea Only and Rash    Reaction to patches    Patient Measurements: Height: 5\' 3"  (160 cm) Weight: 77.6 kg (171 lb 1.2 oz) IBW/kg (Calculated) : 52.4 Heparin Dosing Weight: 69.1 kg  Vital Signs: Temp: 98.6 F (37 C) (05/23 0113) Temp Source: Oral (05/23 0113) BP: 112/83 (05/23 0900) Pulse Rate: 99 (05/23 0900)  Labs: Recent Labs    05/17/20 0100 05/17/20 0135 05/17/20 0534 05/17/20 0718  HGB 11.1* 11.9*  --   --   HCT 37.0 35.0*  --   --   PLT 306  --   --   --   APTT 33  --   --   --   LABPROT 19.8*  --   --   --   INR 1.7*  --   --   --   CREATININE 2.25* 2.30*  --   --   TROPONINIHS  --   --  56* 53*    Estimated Creatinine Clearance: 24.4 mL/min (A) (by C-G formula based on SCr of 2.3 mg/dL (H)).   Medical History: Past Medical History:  Diagnosis Date  . Depression   . Diabetes mellitus without complication ([place])   . Heart disease, congenital   . High cholesterol   . Hypertension   . Panic attack   . Stroke [facility])     Medications:  Scheduled:  . sodium chloride flush  3 mL Intravenous Once    Assessment: Patient is a 57 yof that presents to the ED with increased leg swelling. Patient is being admitted for CHF exacerbation. On Apixaban PTA for afib. Pharmacy has been asked to dose heparin at  this time for the patients Afib.    Last dose of Apixaban was at 2000 on 5/22 Goal of Therapy:  aPTT 66-102 seconds Monitor platelets by anticoagulation protocol: Yes   Plan:  - No Heparin bolus as on Apixaban PTA last dose was at 2000 on 5/22 - Start heparin drip @ 850 units/hr on 5/23 at 1030  - Will monitor heparin using aPTT until aPTT and Heparin levels correlate - aPTT level in ~ 6 hours  - Monitor patient for s/s of bleeding and CBC while on heparin   Duanne Limerick PharmD. BCPS  05/17/2020,10:25 AM

## 2020-05-17 NOTE — ED Notes (Signed)
Pt currently refusing PIV insertion. This RN will reassess need for PIV at a later time.

## 2020-05-17 NOTE — ED Triage Notes (Signed)
Pt comes via [facility] from home, pt has had increased swelling in both legs and numbness in L leg that started this afternoon around noon

## 2020-05-18 LAB — CBC
HCT: 36.6 % (ref 36.0–46.0)
Hemoglobin: 11.4 g/dL — ABNORMAL LOW (ref 12.0–15.0)
MCH: 31.1 pg (ref 26.0–34.0)
MCHC: 31.1 g/dL (ref 30.0–36.0)
MCV: 100 fL (ref 80.0–100.0)
Platelets: 304 10*3/uL (ref 150–400)
RBC: 3.66 MIL/uL — ABNORMAL LOW (ref 3.87–5.11)
RDW: 18.1 % — ABNORMAL HIGH (ref 11.5–15.5)
WBC: 9.1 10*3/uL (ref 4.0–10.5)
nRBC: 0.4 % — ABNORMAL HIGH (ref 0.0–0.2)

## 2020-05-18 LAB — GLUCOSE, CAPILLARY
Glucose-Capillary: 147 mg/dL — ABNORMAL HIGH (ref 70–99)
Glucose-Capillary: 110 mg/dL — ABNORMAL HIGH (ref 70–99)
Glucose-Capillary: 150 mg/dL — ABNORMAL HIGH (ref 70–99)
Glucose-Capillary: 128 mg/dL — ABNORMAL HIGH (ref 70–99)

## 2020-05-18 LAB — BASIC METABOLIC PANEL
Anion gap: 13 (ref 5–15)
BUN: 38 mg/dL — ABNORMAL HIGH (ref 8–23)
CO2: 17 mmol/L — ABNORMAL LOW (ref 22–32)
Calcium: 9 mg/dL (ref 8.9–10.3)
Chloride: 109 mmol/L (ref 98–111)
Creatinine, Ser: 2.35 mg/dL — ABNORMAL HIGH (ref 0.44–1.00)
GFR calc Af Amer: 25 mL/min — ABNORMAL LOW (ref >60)
GFR calc non Af Amer: 21 mL/min — ABNORMAL LOW (ref >60)
Glucose, Bld: 150 mg/dL — ABNORMAL HIGH (ref 70–99)
Potassium: 3.4 mmol/L — ABNORMAL LOW (ref 3.5–5.1)
Sodium: 139 mmol/L (ref 135–145)

## 2020-05-18 MED ORDER — APIXABAN 2.5 MG PO TABS
2.5000 mg | ORAL_TABLET | Freq: Two times a day (BID) | ORAL | Status: DC
  Administered 2020-05-18 – 2020-05-21 (×7): 2.5 mg via ORAL
  Filled 2020-05-18 (×7): qty 1

## 2020-05-18 MED ORDER — MILRINONE LACTATE IN DEXTROSE 20-5 MG/100ML-% IV SOLN: 0.2500 ug/kg/min | INTRAVENOUS | Status: DC

## 2020-05-18 NOTE — Progress Notes (Signed)
Ref: Dixie Dials, MD   Subjective:  Respiratory distress at rest continues. Monitor shows atrial fibrillation.   Objective:  Vital Signs in the last 24 hours: Temp:  [97.7 F (36.5 C)-99.5 F (37.5 C)] 97.7 F (36.5 C) (05/24 0423) Pulse Rate:  [68-122] 100 (05/24 0423) Cardiac Rhythm: Atrial fibrillation (05/24 0715) Resp:  [13-27] 20 (05/24 0423) BP: (124-162)/(83-135) 137/83 (05/24 0423) SpO2:  [89 %-100 %] 99 % (05/24 0423) Weight:  [75.4 kg-77.6 kg] 76.1 kg (05/24 0423)  Physical Exam: BP Readings from Last 1 Encounters:  05/18/20 137/83     Wt Readings from Last 1 Encounters:  05/18/20 76.1 kg    Weight change:  Body mass index is 29.71 kg/m. HEENT: [place]/AT, Eyes-Blue, PERL, EOMI, Conjunctiva-Pink, Sclera-Non-icteric Neck: Full JVD at 60 degree angle, No bruit, Trachea midline. Lungs:  Basal crackles, Bilateral. Cardiac:  Regular rhythm, normal S1 and S2, no S3. II/VI systolic murmur. Abdomen:  Soft, non-tender. BS present. Extremities:  1 + bilateral lower leg edema, left more than right, present. No cyanosis. No clubbing. CNS: AxOx3, Cranial nerves grossly intact, moves all 4 extremities.  Skin: Warm and dry.   Intake/Output from previous day: 05/23 0701 - 05/24 0700 In: 940 [P.O.:940] Out: 1150 [Urine:1150]    Lab Results: BMET    Component Value Date/Time   NA 139 05/18/2020 0614   NA 142 05/17/2020 0135   NA 142 05/17/2020 0100   K 3.4 (L) 05/18/2020 0614   K 3.7 05/17/2020 0135   K 3.9 05/17/2020 0100   CL 109 05/18/2020 0614   CL 114 (H) 05/17/2020 0135   CL 108 05/17/2020 0100   CO2 17 (L) 05/18/2020 0614   CO2 18 (L) 05/17/2020 0100   CO2 18 (L) 02/24/2020 0925   GLUCOSE 150 (H) 05/18/2020 0614   GLUCOSE 100 (H) 05/17/2020 0135   GLUCOSE 111 (H) 05/17/2020 0100   BUN 38 (H) 05/18/2020 0614   BUN 33 (H) 05/17/2020 0135   BUN 34 (H) 05/17/2020 0100   CREATININE 2.35 (H) 05/18/2020 0614   CREATININE 2.30 (H) 05/17/2020 0135   CREATININE  2.25 (H) 05/17/2020 0100   CALCIUM 9.0 05/18/2020 0614   CALCIUM 9.3 05/17/2020 0100   CALCIUM 10.0 02/24/2020 0925   GFRNONAA 21 (L) 05/18/2020 0614   GFRNONAA 22 (L) 05/17/2020 0100   GFRNONAA 23 (L) 02/24/2020 0925   GFRAA 25 (L) 05/18/2020 0614   GFRAA 26 (L) 05/17/2020 0100   GFRAA 26 (L) 02/24/2020 0925   CBC    Component Value Date/Time   WBC 9.1 05/18/2020 0614   RBC 3.66 (L) 05/18/2020 0614   HGB 11.4 (L) 05/18/2020 0614   HCT 36.6 05/18/2020 0614   PLT 304 05/18/2020 0614   MCV 100.0 05/18/2020 0614   MCH 31.1 05/18/2020 0614   MCHC 31.1 05/18/2020 0614   RDW 18.1 (H) 05/18/2020 0614   LYMPHSABS 1.2 05/17/2020 0100   MONOABS 0.6 05/17/2020 0100   EOSABS 0.2 05/17/2020 0100   BASOSABS 0.1 05/17/2020 0100   HEPATIC Function Panel Recent Labs    02/07/20 1542 02/13/20 0346 05/17/20 0100  PROT 7.0 6.1* 6.1*   HEMOGLOBIN A1C No components found for: HGA1C,  MPG CARDIAC ENZYMES Lab Results  Component Value Date   VPXTGGY 694 (H) 02/01/2018   BNP No results for input(s): PROBNP in the last 8760 hours. TSH Recent Labs    01/19/20 1250 05/17/20 1734  TSH 2.243 1.710   CHOLESTEROL Recent Labs    02/08/20 0257  CHOL 202*    Scheduled Meds: . allopurinol  100 mg Oral Daily  . aspirin EC  81 mg Oral Daily  . ferrous sulfate  325 mg Oral Q breakfast  . furosemide  40 mg Intravenous Daily  . insulin aspart  0-9 Units Subcutaneous TID WC  . metoprolol succinate  25 mg Oral Daily  . nitroGLYCERIN  0.5 inch Topical Q8H  . pantoprazole  40 mg Oral BID  . potassium chloride  10 mEq Oral Daily  . sertraline  100 mg Oral QHS  . sodium chloride flush  3 mL Intravenous Once  . sodium chloride flush  3 mL Intravenous Q12H   Continuous Infusions: . sodium chloride    . milrinone     PRN Meds:.sodium chloride, acetaminophen, ALPRAZolam, nitroGLYCERIN, ondansetron (ZOFRAN) IV, sodium chloride flush  Assessment/Plan: Acute systolic and diastolic left heart  failure Multivessel CAD S/P CABG Atrial fibrillation, CHA2DS2VASc score of 5 Sequential SVG to RCA and OM disease Bioprosthetic AV S/P stroke Obesity CKD, IV HTN Hyperlipidemia Generalized anxiety Tobacco use disorder Mild pulmonary systolic HTN COPD Mild hypokalemia  Start Milrinone. Continue furosemide. Small dose potassium supplement Increase activity as tolerated. Discussed diet, activity and medication compliance.   LOS: 1 day   Time spent including chart review, lab review, examination, discussion with patient : 30 min   Dixie Dials  MD  05/18/2020, 9:08 AM

## 2020-05-18 NOTE — Evaluation (Signed)
Physical Therapy Evaluation Patient Details Name: Renee Griffin MRN: [id number] DOB: 1956/02/01 Today's Date: 05/18/2020   History of Present Illness  Pt is a 64 yo female presenting with progressive SOB and LE swelling. PMH includes: CAD post CABG, arotic stenosis s/p aortic valve replacement, NSTEMI, HTN, DM II, HLD, a fib, CKF III, anxiety, and obesity.  Clinical Impression  Pt in bed upon arrival of PT, agreeable to evaluation at this time. Prior to admission the pt was living alone without any assist and mobilizing with use of quad cane, but reports recent decrease in ambulation tolerance due to SOB and pain. The pt now presents with limitations in functional mobility, strength, endurance, power, and activity tolerance due to above dx and gradual decline in activity, and will continue to benefit from skilled PT to address these deficits. The pt was able to demo good capacity for bed mobility and transfers without assistance, but was limited to 20 ft ambulation in room due to onset of significant pain in the posterior aspect of BLE that the pt reported felt like "squeezing" or "cramping". The pt was educated in methods for stretching these areas as well as the importance of hydration and nutrition for improved function. The pt verbalized agreement. The pt will continue to benefit from skilled PT to further progress functional endurance and tolerance for activity as she plans to return to living alone and will need to improve activity tolerance to allow for self-care tasks such as grocery shopping or cooking.      Follow Up Recommendations No PT follow up;Supervision - Intermittent    Equipment Recommendations  3in1 (PT)(3in1 vs tub bench, pt has quad cane for mobility)    Recommendations for Other Services       Precautions / Restrictions Precautions Precautions: Fall Precaution Comments: watch SpO2 Restrictions Weight Bearing Restrictions: No      Mobility  Bed Mobility Overal bed  mobility: Independent                Transfers Overall transfer level: Modified independent Equipment used: Quad cane;None             General transfer comment: pt getting up to [facility] in room without supervision, no LOB. Pt able to stand from EOB without assist or device, limited by nausea. Pt stood from EOB with quad cane for walking with good technique, pivoted to [facility] without AD but holding BSC rails for UE support  Ambulation/Gait Ambulation/Gait assistance: Min guard Gait Distance (Feet): 20 Feet Assistive device: Quad cane Gait Pattern/deviations: Step-through pattern;Decreased stride length;Shuffle   Gait velocity interpretation: <1.31 ft/sec, indicative of household ambulator General Gait Details: pt with short stride that progressed to shorter shuffle steps with onset of bilateral posterior LE pain with ambulation. Pt reports cramping sensation  Stairs            Wheelchair Mobility    Modified Rankin (Stroke Patients Only)       Balance Overall balance assessment: Needs assistance Sitting-balance support: No upper extremity supported Sitting balance-Leahy Scale: Normal     Standing balance support: Single extremity supported;During functional activity Standing balance-Leahy Scale: Fair Standing balance comment: pt able to stand without AD, uses cane for ambulation for stability/pain management                             Pertinent Vitals/Pain Pain Assessment: Faces Faces Pain Scale: Hurts a little bit Pain Location: posterior BLE Pain Descriptors /  Indicators: Cramping;Grimacing;Spasm Pain Intervention(s): Limited activity within patient's tolerance;Monitored during session;Repositioned    Home Living Family/patient expects to be discharged to:: Private residence Living Arrangements: Alone Available Help at Discharge: Other (Comment)(pt reports brother lives 45 min away, no other fam) Type of Home: Apartment Home Access: Stairs to  enter Entrance Stairs-Rails: None Entrance Stairs-Number of Steps: 1 Home Layout: One level Home Equipment: Cane - quad;Walker - 2 wheels;Grab bars - tub/shower Additional Comments: Pt reports usually using quad cane in and out of apt, reports she was unable to walk at all PTA due to SOB and pain    Prior Function Level of Independence: Independent with assistive device(s)         Comments: Reports using cane at baseline     Hand Dominance   Dominant Hand: Left    Extremity/Trunk Assessment   Upper Extremity Assessment Upper Extremity Assessment: Overall WFL for tasks assessed    Lower Extremity Assessment Lower Extremity Assessment: Overall WFL for tasks assessed    Cervical / Trunk Assessment Cervical / Trunk Assessment: Kyphotic  Communication   Communication: No difficulties  Cognition Arousal/Alertness: Awake/alert Behavior During Therapy: Anxious Overall Cognitive Status: Within Functional Limits for tasks assessed                                 General Comments: Pt with general anxiety about moving, especially in regards to nausea and BLE pain with movement, but pt agreeable and with good safety awareness      General Comments General comments (skin integrity, edema, etc.): pt reporting significant nausea this morning, reports improved general pain in BLE that she had at admission, now experiences cramping bilaterally in posterior leg muscles with ambulation. VSS on RA. SpO2 in 90s throughout    Exercises Other Exercises Other Exercises: bilateral seated hamstring stretch. 2 x 30 sec each   Assessment/Plan    PT Assessment Patient needs continued PT services  PT Problem List Decreased strength;Decreased mobility;Decreased activity tolerance;Decreased balance       PT Treatment Interventions DME instruction;Gait training;Stair training;Functional mobility training;Therapeutic activities;Patient/family education;Balance training;Therapeutic  exercise    PT Goals (Current goals can be found in the Care Plan section)  Acute Rehab PT Goals Patient Stated Goal: return home PT Goal Formulation: With patient Time For Goal Achievement: 06/01/20 Potential to Achieve Goals: Good    Frequency Min 3X/week   Barriers to discharge   pt lives alone, reports she has no one that can visit, check on her, or assist with anything such as errands    Co-evaluation               AM-PAC PT "6 Clicks" Mobility  Outcome Measure Help needed turning from your back to your side while in a flat bed without using bedrails?: None Help needed moving from lying on your back to sitting on the side of a flat bed without using bedrails?: None Help needed moving to and from a bed to a chair (including a wheelchair)?: None Help needed standing up from a chair using your arms (e.g., wheelchair or bedside chair)?: None Help needed to walk in hospital room?: A Little Help needed climbing 3-5 steps with a railing? : A Little 6 Click Score: 22    End of Session Equipment Utilized During Treatment: Gait belt Activity Tolerance: Patient tolerated treatment well;Patient limited by pain Patient left: in bed;with call bell/phone within reach(sitting EOB) Nurse Communication: Mobility status  PT Visit Diagnosis: Difficulty in walking, not elsewhere classified (R26.2);Pain Pain - Right/Left: (bilateral) Pain - part of body: Leg    Time: 4199-1444 PT Time Calculation (min) (ACUTE ONLY): 36 min   Charges:   PT Evaluation $PT Eval Moderate Complexity: 1 Mod PT Treatments $Gait Training: 8-22 mins        Karma Ganja, PT, DPT   Acute Rehabilitation Department Pager #: [phone]  Otho Bellows 05/18/2020, 10:40 AM

## 2020-05-18 NOTE — Discharge Instructions (Signed)

## 2020-05-19 LAB — BASIC METABOLIC PANEL
Anion gap: 12 (ref 5–15)
BUN: 39 mg/dL — ABNORMAL HIGH (ref 8–23)
CO2: 21 mmol/L — ABNORMAL LOW (ref 22–32)
Calcium: 9.3 mg/dL (ref 8.9–10.3)
Chloride: 108 mmol/L (ref 98–111)
Creatinine, Ser: 2.27 mg/dL — ABNORMAL HIGH (ref 0.44–1.00)
GFR calc Af Amer: 26 mL/min — ABNORMAL LOW (ref >60)
GFR calc non Af Amer: 22 mL/min — ABNORMAL LOW (ref >60)
Glucose, Bld: 130 mg/dL — ABNORMAL HIGH (ref 70–99)
Potassium: 3.3 mmol/L — ABNORMAL LOW (ref 3.5–5.1)
Sodium: 141 mmol/L (ref 135–145)

## 2020-05-19 LAB — GLUCOSE, CAPILLARY
Glucose-Capillary: 101 mg/dL — ABNORMAL HIGH (ref 70–99)
Glucose-Capillary: 134 mg/dL — ABNORMAL HIGH (ref 70–99)
Glucose-Capillary: 143 mg/dL — ABNORMAL HIGH (ref 70–99)
Glucose-Capillary: 177 mg/dL — ABNORMAL HIGH (ref 70–99)

## 2020-05-19 MED ORDER — POTASSIUM CHLORIDE ER 10 MEQ PO TBCR
20.0000 meq | EXTENDED_RELEASE_TABLET | Freq: Once | ORAL | Status: AC
  Administered 2020-05-19: 20 meq via ORAL
  Filled 2020-05-19: qty 2

## 2020-05-19 MED ORDER — POTASSIUM CHLORIDE CRYS ER 20 MEQ PO TBCR
20.0000 meq | EXTENDED_RELEASE_TABLET | Freq: Two times a day (BID) | ORAL | Status: DC
  Administered 2020-05-19: 20 meq via ORAL
  Filled 2020-05-19: qty 2

## 2020-05-19 NOTE — Progress Notes (Addendum)
Ref: Dixie Dials, MD   Subjective:  Awake. Mild improvement in respiratory distress at rest but exertional dyspnea continues. Good diuresis with lasix and IV milrinone.  Blood work is pending. She is back on Apixaban.  Objective:  Vital Signs in the last 24 hours: Temp:  [98 F (36.7 C)-98.9 F (37.2 C)] 98.9 F (37.2 C) (05/25 0317) Pulse Rate:  [79-108] 105 (05/25 0812) Cardiac Rhythm: Atrial fibrillation (05/24 1901) Resp:  [16-20] 16 (05/25 0812) BP: (106-138)/(62-107) 138/94 (05/25 0812) SpO2:  [91 %-98 %] 96 % (05/25 0812) Weight:  [75.2 kg] 75.2 kg (05/25 0317)  Physical Exam: BP Readings from Last 1 Encounters:  05/19/20 (!) 138/94     Wt Readings from Last 1 Encounters:  05/19/20 75.2 kg    Weight change: -2.439 kg Body mass index is 29.35 kg/m. HEENT: [place]/AT, Eyes-Blue, PERL, EOMI, Conjunctiva-Pink, Sclera-Non-icteric Neck: + JVD, No bruit, Trachea midline. Lungs:  Clearing, Bilateral. Cardiac:  Irregular rhythm, normal S1 and S2, no S3. II/VI systolic murmur. Abdomen:  Soft, non-tender. BS present. Extremities:  Trace to 1 + lower leg edema present. No cyanosis. No clubbing. CNS: AxOx3, Cranial nerves grossly intact, moves all 4 extremities.  Skin: Warm and dry.   Intake/Output from previous day: 05/24 0701 - 05/25 0700 In: 900 [P.O.:900] Out: 1825 [Urine:1825]    Lab Results: BMET    Component Value Date/Time   NA 139 05/18/2020 0614   NA 142 05/17/2020 0135   NA 142 05/17/2020 0100   K 3.4 (L) 05/18/2020 0614   K 3.7 05/17/2020 0135   K 3.9 05/17/2020 0100   CL 109 05/18/2020 0614   CL 114 (H) 05/17/2020 0135   CL 108 05/17/2020 0100   CO2 17 (L) 05/18/2020 0614   CO2 18 (L) 05/17/2020 0100   CO2 18 (L) 02/24/2020 0925   GLUCOSE 150 (H) 05/18/2020 0614   GLUCOSE 100 (H) 05/17/2020 0135   GLUCOSE 111 (H) 05/17/2020 0100   BUN 38 (H) 05/18/2020 0614   BUN 33 (H) 05/17/2020 0135   BUN 34 (H) 05/17/2020 0100   CREATININE 2.35 (H)  05/18/2020 0614   CREATININE 2.30 (H) 05/17/2020 0135   CREATININE 2.25 (H) 05/17/2020 0100   CALCIUM 9.0 05/18/2020 0614   CALCIUM 9.3 05/17/2020 0100   CALCIUM 10.0 02/24/2020 0925   GFRNONAA 21 (L) 05/18/2020 0614   GFRNONAA 22 (L) 05/17/2020 0100   GFRNONAA 23 (L) 02/24/2020 0925   GFRAA 25 (L) 05/18/2020 0614   GFRAA 26 (L) 05/17/2020 0100   GFRAA 26 (L) 02/24/2020 0925   CBC    Component Value Date/Time   WBC 9.1 05/18/2020 0614   RBC 3.66 (L) 05/18/2020 0614   HGB 11.4 (L) 05/18/2020 0614   HCT 36.6 05/18/2020 0614   PLT 304 05/18/2020 0614   MCV 100.0 05/18/2020 0614   MCH 31.1 05/18/2020 0614   MCHC 31.1 05/18/2020 0614   RDW 18.1 (H) 05/18/2020 0614   LYMPHSABS 1.2 05/17/2020 0100   MONOABS 0.6 05/17/2020 0100   EOSABS 0.2 05/17/2020 0100   BASOSABS 0.1 05/17/2020 0100   HEPATIC Function Panel Recent Labs    02/07/20 1542 02/13/20 0346 05/17/20 0100  PROT 7.0 6.1* 6.1*   HEMOGLOBIN A1C No components found for: HGA1C,  MPG CARDIAC ENZYMES Lab Results  Component Value Date   EYCXKGY 185 (H) 02/01/2018   BNP No results for input(s): PROBNP in the last 8760 hours. TSH Recent Labs    01/19/20 1250 05/17/20 1734  TSH  2.243 1.710   CHOLESTEROL Recent Labs    02/08/20 0257  CHOL 202*    Scheduled Meds: . allopurinol  100 mg Oral Daily  . apixaban  2.5 mg Oral BID  . aspirin EC  81 mg Oral Daily  . ferrous sulfate  325 mg Oral Q breakfast  . furosemide  40 mg Intravenous Daily  . insulin aspart  0-9 Units Subcutaneous TID WC  . metoprolol succinate  25 mg Oral Daily  . nitroGLYCERIN  0.5 inch Topical Q8H  . pantoprazole  40 mg Oral BID  . potassium chloride  10 mEq Oral Daily  . sertraline  100 mg Oral QHS  . sodium chloride flush  3 mL Intravenous Once  . sodium chloride flush  3 mL Intravenous Q12H   Continuous Infusions: . sodium chloride     PRN Meds:.sodium chloride, acetaminophen, ALPRAZolam, nitroGLYCERIN, ondansetron (ZOFRAN) IV,  sodium chloride flush  Assessment/Plan: Acute systolic and diastolic left heart failure Multivessel CAD S/P CABG S/P bioprosthetic AV Atrial fibrillation, Chronic S/P stroke S/P Anemia of chronic blood loss Obesity CKD, IV HTN Hyperlipidemia Tobacco use disorder COPD Generalized anxiety  Awaiting blood work this AM. Continue current treatment. Increase activity. Discussed again smoking cessation, diet with fluid restriction, activity and medications    LOS: 2 days   Time spent including chart review, lab review, examination, discussion with patient, nurse and pharmacy : 30 min   Dixie Dials  MD  05/19/2020, 8:22 AM

## 2020-05-19 NOTE — Progress Notes (Signed)
PT Cancellation Note  Patient Details Name: ZHANAE PROFFIT MRN: [id number] DOB: 1956/04/05   Cancelled Treatment:    Reason Eval/Treat Not Completed: Patient declined, no reason specified - Pt refusing mobility this day, states she "isn't really into" mobility with PT/OT. Pt requesting PT sign off, will sign off at this time.   [place] Pager [phone]  Office [phone]    Roxine Caddy D Elonda Husky 05/19/2020, 2:16 PM

## 2020-05-20 LAB — BASIC METABOLIC PANEL
Anion gap: 13 (ref 5–15)
BUN: 45 mg/dL — ABNORMAL HIGH (ref 8–23)
CO2: 21 mmol/L — ABNORMAL LOW (ref 22–32)
Calcium: 9.4 mg/dL (ref 8.9–10.3)
Chloride: 107 mmol/L (ref 98–111)
Creatinine, Ser: 2.29 mg/dL — ABNORMAL HIGH (ref 0.44–1.00)
GFR calc Af Amer: 25 mL/min — ABNORMAL LOW (ref >60)
GFR calc non Af Amer: 22 mL/min — ABNORMAL LOW (ref >60)
Glucose, Bld: 114 mg/dL — ABNORMAL HIGH (ref 70–99)
Potassium: 4.1 mmol/L (ref 3.5–5.1)
Sodium: 141 mmol/L (ref 135–145)

## 2020-05-20 LAB — GLUCOSE, CAPILLARY
Glucose-Capillary: 118 mg/dL — ABNORMAL HIGH (ref 70–99)
Glucose-Capillary: 121 mg/dL — ABNORMAL HIGH (ref 70–99)
Glucose-Capillary: 131 mg/dL — ABNORMAL HIGH (ref 70–99)
Glucose-Capillary: 117 mg/dL — ABNORMAL HIGH (ref 70–99)

## 2020-05-20 MED ORDER — POTASSIUM CHLORIDE CRYS ER 10 MEQ PO TBCR
10.0000 meq | EXTENDED_RELEASE_TABLET | Freq: Two times a day (BID) | ORAL | Status: DC
  Administered 2020-05-20 (×2): 10 meq via ORAL
  Filled 2020-05-20 (×3): qty 1

## 2020-05-20 NOTE — Progress Notes (Signed)
Ref: Dixie Dials, MD   Subjective:  Awake. Afebrile. Mild exertional dyspnea continues. Hypokalemia is corrected.  Objective:  Vital Signs in the last 24 hours: Temp:  [97.7 F (36.5 C)-98.7 F (37.1 C)] 98.7 F (37.1 C) (05/26 0330) Pulse Rate:  [90-99] 99 (05/26 0330) Cardiac Rhythm: Atrial fibrillation (05/26 0811) Resp:  [17-19] 17 (05/26 0330) BP: (97-133)/(68-90) 124/90 (05/26 0330) SpO2:  [93 %-96 %] 96 % (05/26 0330) Weight:  [74.8 kg] 74.8 kg (05/26 0330)  Physical Exam: BP Readings from Last 1 Encounters:  05/20/20 124/90     Wt Readings from Last 1 Encounters:  05/20/20 74.8 kg    Weight change: -0.363 kg Body mass index is 29.21 kg/m. HEENT: [place]/AT, Eyes-Blue, PERL, EOMI, Conjunctiva-Pink, Sclera-Non-icteric Neck: No JVD, No bruit, Trachea midline. Lungs:  Clearing, Bilateral. Cardiac:  Regular rhythm, normal S1 and S2, no S3. II/VI systolic murmur. Abdomen:  Soft, non-tender. BS present. Extremities:  Trace to 1 + lower leg edema present. No cyanosis. No clubbing. CNS: AxOx3, Cranial nerves grossly intact, moves all 4 extremities.  Skin: Warm and dry.   Intake/Output from previous day: 05/25 0701 - 05/26 0700 In: 740 [P.O.:740] Out: 1350 [Urine:1350]    Lab Results: BMET    Component Value Date/Time   NA 141 05/20/2020 0555   NA 141 05/19/2020 0821   NA 139 05/18/2020 0614   K 4.1 05/20/2020 0555   K 3.3 (L) 05/19/2020 0821   K 3.4 (L) 05/18/2020 0614   CL 107 05/20/2020 0555   CL 108 05/19/2020 0821   CL 109 05/18/2020 0614   CO2 21 (L) 05/20/2020 0555   CO2 21 (L) 05/19/2020 0821   CO2 17 (L) 05/18/2020 0614   GLUCOSE 114 (H) 05/20/2020 0555   GLUCOSE 130 (H) 05/19/2020 0821   GLUCOSE 150 (H) 05/18/2020 0614   BUN 45 (H) 05/20/2020 0555   BUN 39 (H) 05/19/2020 0821   BUN 38 (H) 05/18/2020 0614   CREATININE 2.29 (H) 05/20/2020 0555   CREATININE 2.27 (H) 05/19/2020 0821   CREATININE 2.35 (H) 05/18/2020 0614   CALCIUM 9.4 05/20/2020  0555   CALCIUM 9.3 05/19/2020 0821   CALCIUM 9.0 05/18/2020 0614   GFRNONAA 22 (L) 05/20/2020 0555   GFRNONAA 22 (L) 05/19/2020 0821   GFRNONAA 21 (L) 05/18/2020 0614   GFRAA 25 (L) 05/20/2020 0555   GFRAA 26 (L) 05/19/2020 0821   GFRAA 25 (L) 05/18/2020 0614   CBC    Component Value Date/Time   WBC 9.1 05/18/2020 0614   RBC 3.66 (L) 05/18/2020 0614   HGB 11.4 (L) 05/18/2020 0614   HCT 36.6 05/18/2020 0614   PLT 304 05/18/2020 0614   MCV 100.0 05/18/2020 0614   MCH 31.1 05/18/2020 0614   MCHC 31.1 05/18/2020 0614   RDW 18.1 (H) 05/18/2020 0614   LYMPHSABS 1.2 05/17/2020 0100   MONOABS 0.6 05/17/2020 0100   EOSABS 0.2 05/17/2020 0100   BASOSABS 0.1 05/17/2020 0100   HEPATIC Function Panel Recent Labs    02/07/20 1542 02/13/20 0346 05/17/20 0100  PROT 7.0 6.1* 6.1*   HEMOGLOBIN A1C No components found for: HGA1C,  MPG CARDIAC ENZYMES Lab Results  Component Value Date   ONGEXBM 841 (H) 02/01/2018   BNP No results for input(s): PROBNP in the last 8760 hours. TSH Recent Labs    01/19/20 1250 05/17/20 1734  TSH 2.243 1.710   CHOLESTEROL Recent Labs    02/08/20 0257  CHOL 202*    Scheduled Meds: .  allopurinol  100 mg Oral Daily  . apixaban  2.5 mg Oral BID  . aspirin EC  81 mg Oral Daily  . ferrous sulfate  325 mg Oral Q breakfast  . furosemide  40 mg Intravenous Daily  . insulin aspart  0-9 Units Subcutaneous TID WC  . metoprolol succinate  25 mg Oral Daily  . nitroGLYCERIN  0.5 inch Topical Q8H  . pantoprazole  40 mg Oral BID  . potassium chloride  10 mEq Oral BID  . sertraline  100 mg Oral QHS  . sodium chloride flush  3 mL Intravenous Once  . sodium chloride flush  3 mL Intravenous Q12H   Continuous Infusions: . sodium chloride     PRN Meds:.sodium chloride, acetaminophen, ALPRAZolam, nitroGLYCERIN, ondansetron (ZOFRAN) IV, sodium chloride flush  Assessment/Plan: Acute systolic and diastolic left heart failure Multivessel CAD S/P CABG S/P  Bioprosthetic AV Chronic atrial fibrillation S/P stroke Anemia of chronic blood loss HTN CKD IV Hyperlipidemia Tobacco use disorder COPD Generalized anxiety  Increase activity. DC milrinone. Change to oral torsemide form tomorrow Discussed again smoking cessation, fluid restriction and 2 gm salt intake limit.   LOS: 3 days   Time spent including chart review, lab review, examination, discussion with patient : 30 min   Dixie Dials  MD  05/20/2020, 8:49 AM

## 2020-05-21 LAB — BASIC METABOLIC PANEL
Anion gap: 12 (ref 5–15)
BUN: 47 mg/dL — ABNORMAL HIGH (ref 8–23)
CO2: 21 mmol/L — ABNORMAL LOW (ref 22–32)
Calcium: 9.5 mg/dL (ref 8.9–10.3)
Chloride: 106 mmol/L (ref 98–111)
Creatinine, Ser: 2.23 mg/dL — ABNORMAL HIGH (ref 0.44–1.00)
GFR calc Af Amer: 26 mL/min — ABNORMAL LOW (ref >60)
GFR calc non Af Amer: 23 mL/min — ABNORMAL LOW (ref >60)
Glucose, Bld: 111 mg/dL — ABNORMAL HIGH (ref 70–99)
Potassium: 4.3 mmol/L (ref 3.5–5.1)
Sodium: 139 mmol/L (ref 135–145)

## 2020-05-21 LAB — GLUCOSE, CAPILLARY: Glucose-Capillary: 104 mg/dL — ABNORMAL HIGH (ref 70–99)

## 2020-05-21 MED ORDER — POTASSIUM CHLORIDE CRYS ER 10 MEQ PO TBCR
10.0000 meq | EXTENDED_RELEASE_TABLET | Freq: Every day | ORAL | Status: DC
  Administered 2020-05-21: 10 meq via ORAL

## 2020-05-21 MED ORDER — PROMETHAZINE HCL 25 MG PO TABS
25.0000 mg | ORAL_TABLET | Freq: Once | ORAL | Status: AC
  Administered 2020-05-21: 25 mg via ORAL
  Filled 2020-05-21: qty 1

## 2020-05-21 MED ORDER — TORSEMIDE 20 MG PO TABS: 20.0000 mg | ORAL_TABLET | Freq: Every day | ORAL | 3 refills | Status: DC

## 2020-05-21 MED ORDER — TORSEMIDE 20 MG PO TABS
20.0000 mg | ORAL_TABLET | Freq: Every day | ORAL | Status: DC
  Administered 2020-05-21: 20 mg via ORAL
  Filled 2020-05-21: qty 1

## 2020-05-21 NOTE — Progress Notes (Signed)
Discharge education and medication education given to patient with teach back. Education on increasing activity slowly, low sodium heart healthy diet, and when to call MD given.  All questions and concerns answered. Peripheral IV and telemetry leads removed. All patient belongings given to patient. Patient transported to main entrance by nurse tech via wheelchair.

## 2020-05-21 NOTE — Care Management Important Message (Signed)
Important Message  Patient Details  Name: DENIS KOPPEL MRN: [id number] Date of Birth: [birth year]   Medicare Important Message Given:  Yes     Shelda Altes 05/21/2020, 2:36 PM

## 2020-05-21 NOTE — Discharge Summary (Signed)
Physician Discharge Summary  Patient ID: Renee Griffin MRN: [id number] DOB/AGE: [birth year] 64 y.o.  Admit date: 05/17/2020 Discharge date: 05/21/2020  Admission Diagnoses: Acute decompensated congestive heart failure Valvular heart disease Multivessel CAD S/P recent small recent small non-Q wave MI S/P CABG/AVR with bioprosthetic AV Hypertension Diabetes mellitus Type 2 DM Hyperlipidemia Chronic kidney disease, stage IIIb H/O GI bleed in past Obesity Anxiety disorder  Discharge Diagnoses:  Principle diagnosis: Acute systolic and diastolic left heart failure Active Problems: Multivessel CAD S/P CABG S/P Bioprosthetic AV Chronic atrial fibrillation, CHA2DS2VASc score of 6 S/P stroke Hypertension Hyperlipidemia COPD CKD, IV Chronic tobacco use disorder Anemia of chronic blood loss Generalized anxiety  Discharged Condition: fair  Hospital Course: 64 years old female with recent small non-Q wave MI, HTN, Hyperlipidemia, tobacco use disorder, chronic atrial fibrillation on Eliquis, CKD, IV, H/O GI bleed, Anemia of chronic blood loss and obesity presented with increasing shortness of breath and leg swelling. She responded to IV furosemide and IV milrinone followed by oral Demadex. She had mild bleeding from hand wound with IV heparin use.  Patient was made aware of over 50 % renal function deterioration, mild systolic and diastolic left heart failure and need to decrease salt and fluid intake by as much as 50 %. Smoking cessation was also discussed and patient agrees to cut down. She was discharged home in stable condition with follow up by me in 1 week.                                               Consults: cardiology  Significant Diagnostic Studies: labs: Normal WBC and Hgb of 11.4 g/dl. Normal electrolytes, mildly elevated glucose of 111 mg; Creatinine of 2.25 mg. Normal TSH. Hgb A1C of 6.4 %. Minimal elevation of Troponin I from demand ischemia. BNP was 4476.9 pg.  EKG:  Atrial fibrillation, rightward axis and old Antero-Septal Wall MI.  Chest X-ray: Cardiomegaly with diffuse pulmonary interstitial congestion.  Treatments: cardiac meds: metoprolol, diltiazem, Aspirin, Eliquis, Gemfibrozil, Demadex and potassium.  Discharge Exam: Blood pressure 126/77, pulse (!) 103, temperature 97.6 F (36.4 C), temperature source Oral, resp. rate 16, height _0  (1.6 m), weight 74.3 kg, SpO2 96 %. General appearance: alert, cooperative and appears stated age. Head: Normocephalic, atraumatic. Eyes: Blue eyes, wears blasses, pink conjunctiva, corneas clear. PERRL, EOM's intact.  Neck: No adenopathy, no carotid bruit, no JVD, supple, symmetrical, trachea midline and thyroid not enlarged. Resp: Clearing to auscultation bilaterally. Cardio: Regular rate and rhythm, S1, S2 normal, II/VI systolic murmur, no click, rub or gallop. GI: Soft, non-tender; bowel sounds normal; no organomegaly. Extremities: No edema, cyanosis or clubbing. Skin: Warm and dry.  Neurologic: Alert and oriented X 3, normal strength and tone. Normal coordination and gait.  Disposition: Discharge disposition: 01-Home or Self Care        Allergies as of 05/21/2020      Reactions   Amoxicillin-pot Clavulanate Diarrhea   Has patient had a PCN reaction causing immediate rash, facial/tongue/throat swelling, SOB or lightheadedness with hypotension: Unknown Has patient had a PCN reaction causing severe rash involving mucus membranes or skin necrosis:UnknoWN Has patient had a PCN reaction that required hospitalization: Unknown Has patient had a PCN reaction occurring within the last 10 years: Unknown If all of the above answers are "NO", then may proceed with Cephalosporin use.   Bupropion Nausea Only  Nicotine Nausea Only, Rash   Reaction to patches      Medication List    STOP taking these medications   furosemide 40 MG tablet Commonly known as: LASIX   metFORMIN 500 MG tablet Commonly known  as: GLUCOPHAGE     TAKE these medications   Accu-Chek Aviva Plus test strip Generic drug: glucose blood See admin instructions.   Accu-Chek Aviva Plus w/Device Kit See admin instructions.   Accu-Chek Softclix Lancets lancets See admin instructions.   acetaminophen 325 MG tablet Commonly known as: TYLENOL Take 2 tablets (650 mg total) by mouth every 4 (four) hours as needed for headache or mild pain.   albuterol 108 (90 Base) MCG/ACT inhaler Commonly known as: VENTOLIN HFA Inhale 2 puffs into the lungs every 6 (six) hours as needed for wheezing or shortness of breath.   allopurinol 100 MG tablet Commonly known as: ZYLOPRIM Take 1 tablet (100 mg total) by mouth daily.   ALPRAZolam 0.5 MG tablet Commonly known as: XANAX Take 1 tablet (0.5 mg total) by mouth 2 (two) times daily as needed for anxiety. What changed: when to take this   apixaban 2.5 MG Tabs tablet Commonly known as: ELIQUIS Take 1 tablet (2.5 mg total) by mouth 2 (two) times daily.   aspirin EC 81 MG tablet Take 1 tablet (81 mg total) by mouth daily.   cholecalciferol 25 MCG (1000 UNIT) tablet Commonly known as: VITAMIN D3 Take 1,000 Units by mouth daily.   diltiazem 60 MG tablet Commonly known as: CARDIZEM Take 1 tablet (60 mg total) by mouth 2 (two) times daily.   ferrous sulfate 325 (65 FE) MG tablet Take 1 tablet (325 mg total) by mouth daily with breakfast.   gemfibrozil 600 MG tablet Commonly known as: LOPID Take 600 mg by mouth 2 (two) times daily.   loperamide 2 MG capsule Commonly known as: IMODIUM Take 1 capsule (2 mg total) by mouth 4 (four) times daily as needed for diarrhea or loose stools. What changed: when to take this   metoprolol tartrate 25 MG tablet Commonly known as: LOPRESSOR Take 0.5 tablets (12.5 mg total) by mouth 2 (two) times daily.   multivitamin with minerals Tabs tablet Take 1 tablet by mouth daily.   nitroGLYCERIN 0.4 MG SL tablet Commonly known as:  NITROSTAT Place 1 tablet (0.4 mg total) under the tongue every 5 (five) minutes x 3 doses as needed for chest pain.   ondansetron 4 MG tablet Commonly known as: ZOFRAN Take 1 tablet (4 mg total) by mouth every 6 (six) hours as needed for nausea or vomiting.   oxymetazoline 0.05 % nasal spray Commonly known as: AFRIN Place 1 spray into both nostrils 2 (two) times daily.   pantoprazole 40 MG tablet Commonly known as: PROTONIX Take 1 tablet (40 mg total) by mouth 2 (two) times daily. What changed:   when to take this  reasons to take this   potassium chloride 10 MEQ tablet Commonly known as: KLOR-CON Take 10 mEq by mouth daily.   prazosin 1 MG capsule Commonly known as: MINIPRESS Take 1 capsule (1 mg total) by mouth 2 (two) times daily.   promethazine 25 MG tablet Commonly known as: PHENERGAN Take 1 tablet (25 mg total) by mouth every 6 (six) hours as needed for nausea or vomiting.   sertraline 100 MG tablet Commonly known as: ZOLOFT Take 100 mg by mouth at bedtime.   tiZANidine 2 MG tablet Commonly known as: ZANAFLEX Take 1 tablet (2  mg total) by mouth at bedtime as needed for muscle spasms.   torsemide 20 MG tablet Commonly known as: DEMADEX Take 1 tablet (20 mg total) by mouth daily.   valACYclovir 500 MG tablet Commonly known as: VALTREX Take 500 mg by mouth daily.      Follow-up Information    Dixie Dials, MD. Schedule an appointment as soon as possible for a visit in 1 week(s).   Specialty: Cardiology Contact information: [place] [address] [phone]           Time spent: Review of old chart, current chart, lab, x-ray, cardiac tests and discussion with patient over 60 minutes.  Signed: [name] 05/21/2020, 8:38 AM

## 2020-05-21 NOTE — TOC Initial Note (Addendum)
Transition of Care [facility]) - Initial/Assessment Note    Patient Details  Name: Renee Griffin MRN: [id number] Date of Birth: Oct 27, 1956  Transition of [facility]) CM/SW Contact:    Zenon Mayo, RN Phone Number: 05/21/2020, 9:21 AM  Clinical Narrative:                 Patient is from home alone, she states she has walker and cane at home, she will need transportation assistance at dc.  NCM asked if she wanted [facility] for CHF disease management, she states no she does not.  Staff RN to call and let NCM know when patient is ready for transport.  Expected Discharge Plan: Home/Self Care Barriers to Discharge: No Barriers Identified   Patient Goals and CMS Choice Patient states their goals for this hospitalization and ongoing recovery are:: do better   Choice offered to / list presented to : NA  Expected Discharge Plan and Services Expected Discharge Plan: Home/Self Care   Discharge Planning Services: CM Consult Post Acute Care Choice: NA Living arrangements for the past 2 months: Apartment Expected Discharge Date: 05/21/20                 DME Agency: NA       HH Arranged: NA          Prior Living Arrangements/Services Living arrangements for the past 2 months: Apartment Lives with:: Self Patient language and need for interpreter reviewed:: Yes Do you feel safe going back to the place where you live?: Yes      Need for Family Participation in Patient Care: No (Comment) Care giver support system in place?: No (comment) Current home services: DME(walker, cane) Criminal Activity/Legal Involvement Pertinent to Current Situation/Hospitalization: No - Comment as needed  Activities of Daily Living      Permission Sought/Granted                  Emotional Assessment   Attitude/Demeanor/Rapport: Engaged Affect (typically observed): Appropriate Orientation: : Oriented to Self, Oriented to Place, Oriented to  Time, Oriented to Situation Alcohol / Substance Use:  Not Applicable Psych Involvement: No (comment)  Admission diagnosis:  Acute congestive heart failure (HCC) [I50.9] Acute pulmonary edema ([place]) [J81.0] Elevated troponin [R77.8] Patient Active Problem List   Diagnosis Date Noted  . Acute congestive heart failure ([place]) 05/17/2020  . NSTEMI (non-ST elevated myocardial infarction) ([place]) 02/07/2020  . Acute systolic heart failure ([place]) 01/19/2020  . Acute lower GI bleeding 12/16/2018  . Dehydration 04/13/2018  . Ovarian mass, left   . Abdominal pain 02/01/2018  . Essential hypertension 03/04/2016  . HLD (hyperlipidemia) 03/04/2016  . Type 2 diabetes mellitus with circulatory disorder ([place]) 03/04/2016  . Obesity 03/04/2016  . CVA (cerebral vascular accident) ([place]) 03/05/2015  . Bilateral low back pain without sciatica 03/05/2015  . Essential hypertension, benign 03/05/2015  . Tobacco use disorder 03/05/2015  . Hyperlipidemia 03/05/2015  . Hyperparathyroidism, primary ([place]) 02/13/2013  . Generalized ischemic cerebrovascular disease 02/06/2013  . Hyperreflexia 02/06/2013  . Abnormality of gait 02/06/2013  . Disturbance of skin sensation 02/06/2013  . CVA (cerebral infarction) 06/03/2012  . HTN (hypertension) 06/03/2012  . Dyslipidemia 06/03/2012  . Anxiety 06/03/2012   PCP:  Dixie Dials, MD Pharmacy:   [id number] - [place], [address] AT [address] [place] [address] Phone: [phone] Fax: [place], [address]  [place] [address] Phone: [phone] Fax: [phone]     Social Determinants of Health (SDOH) Interventions    Readmission Risk Interventions Readmission Risk Prevention Plan 05/21/2020 02/13/2020  Transportation Screening Complete Complete  PCP or Specialist Appt within 3-5 Days Complete Complete  HRI or Home Care Consult Complete Complete   Social Work Consult for [place] Planning/Counseling Complete Complete  Palliative Care Screening Not Applicable Not Applicable  Medication Review Press photographer) Complete Complete  Some recent data might be hidden

## 2020-05-23 ENCOUNTER — Emergency Department (HOSPITAL_COMMUNITY): Payer: Medicare Other

## 2020-05-23 ENCOUNTER — Inpatient Hospital Stay (HOSPITAL_COMMUNITY)
Admission: EM | Admit: 2020-05-23 | Discharge: 2020-05-27 | DRG: 308 | Disposition: A | Discharge status: Home Health | Payer: Medicare Other | Attending: Cardiovascular Disease | Admitting: Cardiovascular Disease

## 2020-05-23 ENCOUNTER — Other Ambulatory Visit: Payer: Self-pay

## 2020-05-23 ENCOUNTER — Encounter (HOSPITAL_COMMUNITY): Payer: Self-pay

## 2020-05-23 DIAGNOSIS — G8929 Other chronic pain: Secondary | ICD-10-CM | POA: Diagnosis present

## 2020-05-23 DIAGNOSIS — Z888 Allergy status to other drugs, medicaments and biological substances status: Secondary | ICD-10-CM

## 2020-05-23 DIAGNOSIS — F410 Panic disorder [episodic paroxysmal anxiety] without agoraphobia: Secondary | ICD-10-CM | POA: Diagnosis present

## 2020-05-23 DIAGNOSIS — I272 Pulmonary hypertension, unspecified: Secondary | ICD-10-CM | POA: Diagnosis present

## 2020-05-23 DIAGNOSIS — E785 Hyperlipidemia, unspecified: Secondary | ICD-10-CM | POA: Diagnosis present

## 2020-05-23 DIAGNOSIS — F329 Major depressive disorder, single episode, unspecified: Secondary | ICD-10-CM | POA: Diagnosis present

## 2020-05-23 DIAGNOSIS — I5043 Acute on chronic combined systolic (congestive) and diastolic (congestive) heart failure: Secondary | ICD-10-CM | POA: Diagnosis present

## 2020-05-23 DIAGNOSIS — Z8673 Personal history of transient ischemic attack (TIA), and cerebral infarction without residual deficits: Secondary | ICD-10-CM

## 2020-05-23 DIAGNOSIS — Z6829 Body mass index (BMI) 29.0-29.9, adult: Secondary | ICD-10-CM | POA: Diagnosis not present

## 2020-05-23 DIAGNOSIS — I130 Hypertensive heart and chronic kidney disease with heart failure and stage 1 through stage 4 chronic kidney disease, or unspecified chronic kidney disease: Secondary | ICD-10-CM | POA: Diagnosis present

## 2020-05-23 DIAGNOSIS — N184 Chronic kidney disease, stage 4 (severe): Secondary | ICD-10-CM | POA: Diagnosis present

## 2020-05-23 DIAGNOSIS — I4891 Unspecified atrial fibrillation: Secondary | ICD-10-CM | POA: Diagnosis present

## 2020-05-23 DIAGNOSIS — D638 Anemia in other chronic diseases classified elsewhere: Secondary | ICD-10-CM | POA: Diagnosis present

## 2020-05-23 DIAGNOSIS — M25511 Pain in right shoulder: Secondary | ICD-10-CM | POA: Diagnosis present

## 2020-05-23 DIAGNOSIS — J449 Chronic obstructive pulmonary disease, unspecified: Secondary | ICD-10-CM | POA: Diagnosis present

## 2020-05-23 DIAGNOSIS — E7800 Pure hypercholesterolemia, unspecified: Secondary | ICD-10-CM | POA: Diagnosis present

## 2020-05-23 DIAGNOSIS — E669 Obesity, unspecified: Secondary | ICD-10-CM | POA: Diagnosis present

## 2020-05-23 DIAGNOSIS — Z880 Allergy status to penicillin: Secondary | ICD-10-CM | POA: Diagnosis not present

## 2020-05-23 DIAGNOSIS — Z951 Presence of aortocoronary bypass graft: Secondary | ICD-10-CM | POA: Diagnosis not present

## 2020-05-23 DIAGNOSIS — Z806 Family history of leukemia: Secondary | ICD-10-CM

## 2020-05-23 DIAGNOSIS — F17210 Nicotine dependence, cigarettes, uncomplicated: Secondary | ICD-10-CM | POA: Diagnosis present

## 2020-05-23 DIAGNOSIS — E1122 Type 2 diabetes mellitus with diabetic chronic kidney disease: Secondary | ICD-10-CM | POA: Diagnosis present

## 2020-05-23 DIAGNOSIS — R0902 Hypoxemia: Secondary | ICD-10-CM | POA: Diagnosis present

## 2020-05-23 DIAGNOSIS — Z20822 Contact with and (suspected) exposure to covid-19: Secondary | ICD-10-CM | POA: Diagnosis present

## 2020-05-23 DIAGNOSIS — R0602 Shortness of breath: Secondary | ICD-10-CM | POA: Diagnosis not present

## 2020-05-23 DIAGNOSIS — I509 Heart failure, unspecified: Secondary | ICD-10-CM

## 2020-05-23 LAB — POCT I-STAT 7, (LYTES, BLD GAS, ICA,H+H)
Acid-base deficit: 4 mmol/L — ABNORMAL HIGH (ref 0.0–2.0)
Bicarbonate: 19.1 mmol/L — ABNORMAL LOW (ref 20.0–28.0)
Calcium, Ion: 1.25 mmol/L (ref 1.15–1.40)
HCT: 35 % — ABNORMAL LOW (ref 36.0–46.0)
Hemoglobin: 11.9 g/dL — ABNORMAL LOW (ref 12.0–15.0)
O2 Saturation: 94 %
Potassium: 3.6 mmol/L (ref 3.5–5.1)
Sodium: 143 mmol/L (ref 135–145)
TCO2: 20 mmol/L — ABNORMAL LOW (ref 22–32)
pCO2 arterial: 27.2 mmHg — ABNORMAL LOW (ref 32.0–48.0)
pH, Arterial: 7.453 — ABNORMAL HIGH (ref 7.350–7.450)
pO2, Arterial: 67 mmHg — ABNORMAL LOW (ref 83.0–108.0)

## 2020-05-23 LAB — TROPONIN I (HIGH SENSITIVITY)
Troponin I (High Sensitivity): 140 ng/L (ref <18)
Troponin I (High Sensitivity): 194 ng/L (ref <18)

## 2020-05-23 LAB — CBC
HCT: 37.9 % (ref 36.0–46.0)
Hemoglobin: 11.5 g/dL — ABNORMAL LOW (ref 12.0–15.0)
MCH: 30.3 pg (ref 26.0–34.0)
MCHC: 30.3 g/dL (ref 30.0–36.0)
MCV: 100 fL (ref 80.0–100.0)
Platelets: 278 10*3/uL (ref 150–400)
RBC: 3.79 MIL/uL — ABNORMAL LOW (ref 3.87–5.11)
RDW: 18.9 % — ABNORMAL HIGH (ref 11.5–15.5)
WBC: 9.2 10*3/uL (ref 4.0–10.5)
nRBC: 0 % (ref 0.0–0.2)

## 2020-05-23 LAB — BASIC METABOLIC PANEL
Anion gap: 14 (ref 5–15)
BUN: 37 mg/dL — ABNORMAL HIGH (ref 8–23)
CO2: 20 mmol/L — ABNORMAL LOW (ref 22–32)
Calcium: 9.4 mg/dL (ref 8.9–10.3)
Chloride: 111 mmol/L (ref 98–111)
Creatinine, Ser: 1.87 mg/dL — ABNORMAL HIGH (ref 0.44–1.00)
GFR calc Af Amer: 32 mL/min — ABNORMAL LOW (ref >60)
GFR calc non Af Amer: 28 mL/min — ABNORMAL LOW (ref >60)
Glucose, Bld: 144 mg/dL — ABNORMAL HIGH (ref 70–99)
Potassium: 3.9 mmol/L (ref 3.5–5.1)
Sodium: 145 mmol/L (ref 135–145)

## 2020-05-23 LAB — SARS CORONAVIRUS 2 BY RT PCR (HOSPITAL ORDER, PERFORMED IN ~~LOC~~ HOSPITAL LAB): SARS Coronavirus 2: NEGATIVE

## 2020-05-23 LAB — BRAIN NATRIURETIC PEPTIDE: B Natriuretic Peptide: 3252 pg/mL — ABNORMAL HIGH (ref 0.0–100.0)

## 2020-05-23 MED ORDER — DILTIAZEM LOAD VIA INFUSION
15.0000 mg | Freq: Once | INTRAVENOUS | Status: AC
  Administered 2020-05-23: 15 mg via INTRAVENOUS
  Filled 2020-05-23: qty 15

## 2020-05-23 MED ORDER — GEMFIBROZIL 600 MG PO TABS
600.0000 mg | ORAL_TABLET | Freq: Two times a day (BID) | ORAL | Status: DC
  Administered 2020-05-23 – 2020-05-27 (×8): 600 mg via ORAL
  Filled 2020-05-23 (×9): qty 1

## 2020-05-23 MED ORDER — METHYLPREDNISOLONE SODIUM SUCC 125 MG IJ SOLR
125.0000 mg | Freq: Once | INTRAMUSCULAR | Status: AC
  Administered 2020-05-23: 125 mg via INTRAVENOUS
  Filled 2020-05-23: qty 2

## 2020-05-23 MED ORDER — FUROSEMIDE 10 MG/ML IJ SOLN
40.0000 mg | Freq: Once | INTRAMUSCULAR | Status: AC
  Administered 2020-05-23: 40 mg via INTRAVENOUS
  Filled 2020-05-23: qty 4

## 2020-05-23 MED ORDER — LORAZEPAM 1 MG PO TABS
1.0000 mg | ORAL_TABLET | Freq: Once | ORAL | Status: AC
  Administered 2020-05-23: 1 mg via ORAL
  Filled 2020-05-23: qty 1

## 2020-05-23 MED ORDER — VALACYCLOVIR HCL 500 MG PO TABS
500.0000 mg | ORAL_TABLET | Freq: Every day | ORAL | Status: DC
  Administered 2020-05-23 – 2020-05-27 (×5): 500 mg via ORAL
  Filled 2020-05-23 (×5): qty 1

## 2020-05-23 MED ORDER — POTASSIUM CHLORIDE CRYS ER 10 MEQ PO TBCR
10.0000 meq | EXTENDED_RELEASE_TABLET | Freq: Two times a day (BID) | ORAL | Status: DC
  Administered 2020-05-23 – 2020-05-27 (×8): 10 meq via ORAL
  Filled 2020-05-23 (×8): qty 1

## 2020-05-23 MED ORDER — ONDANSETRON HCL 4 MG PO TABS: 4.0000 mg | ORAL_TABLET | Freq: Four times a day (QID) | ORAL | Status: DC | PRN

## 2020-05-23 MED ORDER — VITAMIN D 25 MCG (1000 UNIT) PO TABS
1000.0000 [IU] | ORAL_TABLET | Freq: Every day | ORAL | Status: DC
  Administered 2020-05-23 – 2020-05-27 (×5): 1000 [IU] via ORAL
  Filled 2020-05-23 (×5): qty 1

## 2020-05-23 MED ORDER — TIZANIDINE HCL 2 MG PO TABS
2.0000 mg | ORAL_TABLET | Freq: Every evening | ORAL | Status: DC | PRN
  Filled 2020-05-23: qty 1

## 2020-05-23 MED ORDER — METOPROLOL TARTRATE 12.5 MG HALF TABLET
12.5000 mg | ORAL_TABLET | Freq: Two times a day (BID) | ORAL | Status: DC
  Administered 2020-05-23 – 2020-05-27 (×8): 12.5 mg via ORAL
  Filled 2020-05-23 (×8): qty 1

## 2020-05-23 MED ORDER — SERTRALINE HCL 100 MG PO TABS
100.0000 mg | ORAL_TABLET | Freq: Every day | ORAL | Status: DC
  Administered 2020-05-23 – 2020-05-26 (×4): 100 mg via ORAL
  Filled 2020-05-23 (×5): qty 1

## 2020-05-23 MED ORDER — APIXABAN 2.5 MG PO TABS
2.5000 mg | ORAL_TABLET | Freq: Two times a day (BID) | ORAL | Status: DC
  Administered 2020-05-23 – 2020-05-27 (×8): 2.5 mg via ORAL
  Filled 2020-05-23 (×9): qty 1

## 2020-05-23 MED ORDER — ALPRAZOLAM 0.5 MG PO TABS
0.5000 mg | ORAL_TABLET | Freq: Two times a day (BID) | ORAL | Status: DC | PRN
  Administered 2020-05-24 – 2020-05-25 (×4): 0.5 mg via ORAL
  Filled 2020-05-23: qty 2
  Filled 2020-05-23 (×3): qty 1

## 2020-05-23 MED ORDER — OXYMETAZOLINE HCL 0.05 % NA SOLN
1.0000 | Freq: Two times a day (BID) | NASAL | Status: DC
  Administered 2020-05-25 – 2020-05-27 (×3): 1 via NASAL
  Filled 2020-05-23: qty 30

## 2020-05-23 MED ORDER — DILTIAZEM HCL-DEXTROSE 125-5 MG/125ML-% IV SOLN (PREMIX)
5.0000 mg/h | INTRAVENOUS | Status: DC
  Administered 2020-05-23: 5 mg/h via INTRAVENOUS
  Administered 2020-05-24: 12.5 mg/h via INTRAVENOUS
  Filled 2020-05-23 (×2): qty 125

## 2020-05-23 MED ORDER — NITROGLYCERIN 0.4 MG SL SUBL: 0.4000 mg | SUBLINGUAL_TABLET | SUBLINGUAL | Status: DC | PRN

## 2020-05-23 MED ORDER — DILTIAZEM HCL 25 MG/5ML IV SOLN
15.0000 mg | Freq: Once | INTRAVENOUS | Status: AC
  Administered 2020-05-23: 15 mg via INTRAVENOUS
  Filled 2020-05-23: qty 5

## 2020-05-23 MED ORDER — PANTOPRAZOLE SODIUM 40 MG PO TBEC
40.0000 mg | DELAYED_RELEASE_TABLET | Freq: Two times a day (BID) | ORAL | Status: DC
  Administered 2020-05-23 – 2020-05-27 (×8): 40 mg via ORAL
  Filled 2020-05-23 (×8): qty 1

## 2020-05-23 MED ORDER — ADULT MULTIVITAMIN W/MINERALS CH
1.0000 | ORAL_TABLET | Freq: Every day | ORAL | Status: DC
  Administered 2020-05-23 – 2020-05-27 (×5): 1 via ORAL
  Filled 2020-05-23 (×5): qty 1

## 2020-05-23 MED ORDER — ASPIRIN EC 81 MG PO TBEC
81.0000 mg | DELAYED_RELEASE_TABLET | Freq: Every day | ORAL | Status: DC
  Administered 2020-05-23 – 2020-05-27 (×5): 81 mg via ORAL
  Filled 2020-05-23 (×5): qty 1

## 2020-05-23 MED ORDER — FERROUS SULFATE 325 (65 FE) MG PO TABS
325.0000 mg | ORAL_TABLET | Freq: Every day | ORAL | Status: DC
  Administered 2020-05-24 – 2020-05-27 (×4): 325 mg via ORAL
  Filled 2020-05-23 (×4): qty 1

## 2020-05-23 MED ORDER — ACETAMINOPHEN 325 MG PO TABS
650.0000 mg | ORAL_TABLET | ORAL | Status: DC | PRN
  Administered 2020-05-24 – 2020-05-27 (×2): 650 mg via ORAL
  Filled 2020-05-23 (×2): qty 2

## 2020-05-23 MED ORDER — PRAZOSIN HCL 1 MG PO CAPS
1.0000 mg | ORAL_CAPSULE | Freq: Two times a day (BID) | ORAL | Status: DC
  Administered 2020-05-23 – 2020-05-27 (×8): 1 mg via ORAL
  Filled 2020-05-23 (×9): qty 1

## 2020-05-23 MED ORDER — ALBUTEROL SULFATE HFA 108 (90 BASE) MCG/ACT IN AERS
4.0000 | INHALATION_SPRAY | Freq: Once | RESPIRATORY_TRACT | Status: AC
  Administered 2020-05-23: 4 via RESPIRATORY_TRACT
  Filled 2020-05-23: qty 6.7

## 2020-05-23 MED ORDER — PROMETHAZINE HCL 25 MG PO TABS: 25.0000 mg | ORAL_TABLET | Freq: Four times a day (QID) | ORAL | Status: DC | PRN

## 2020-05-23 MED ORDER — SODIUM CHLORIDE 0.9 % IV SOLN: 250.0000 mL | INTRAVENOUS | Status: DC | PRN

## 2020-05-23 MED ORDER — ALLOPURINOL 100 MG PO TABS
100.0000 mg | ORAL_TABLET | Freq: Every day | ORAL | Status: DC
  Administered 2020-05-23 – 2020-05-27 (×5): 100 mg via ORAL
  Filled 2020-05-23 (×5): qty 1

## 2020-05-23 MED ORDER — SODIUM CHLORIDE 0.9% FLUSH: 3.0000 mL | INTRAVENOUS | Status: DC | PRN

## 2020-05-23 MED ORDER — ALBUTEROL SULFATE (2.5 MG/3ML) 0.083% IN NEBU: 2.5000 mg | INHALATION_SOLUTION | Freq: Four times a day (QID) | RESPIRATORY_TRACT | Status: DC | PRN

## 2020-05-23 MED ORDER — LOPERAMIDE HCL 2 MG PO CAPS: 2.0000 mg | ORAL_CAPSULE | Freq: Every day | ORAL | Status: DC | PRN

## 2020-05-23 MED ORDER — SODIUM CHLORIDE 0.9% FLUSH
3.0000 mL | Freq: Two times a day (BID) | INTRAVENOUS | Status: DC
  Administered 2020-05-24 – 2020-05-26 (×6): 3 mL via INTRAVENOUS

## 2020-05-23 MED ORDER — FUROSEMIDE 10 MG/ML IJ SOLN
40.0000 mg | Freq: Two times a day (BID) | INTRAMUSCULAR | Status: DC
  Administered 2020-05-24: 40 mg via INTRAVENOUS
  Filled 2020-05-23: qty 4

## 2020-05-23 MED ORDER — ACETAMINOPHEN 325 MG PO TABS
650.0000 mg | ORAL_TABLET | Freq: Once | ORAL | Status: AC
  Administered 2020-05-23: 650 mg via ORAL
  Filled 2020-05-23: qty 2

## 2020-05-23 MED ORDER — POTASSIUM CHLORIDE ER 10 MEQ PO TBCR: 10.0000 meq | EXTENDED_RELEASE_TABLET | Freq: Every day | ORAL | Status: DC

## 2020-05-23 NOTE — ED Triage Notes (Signed)
Pt BIB [facility] for shortness of breath, Afib, tachycardic , tachypnic. Seen recently for CHF exaccerbation, unknown medication compliance. 94% on RA, placed on 2L for comfort. Received a total of 20mg  cardizem PTA. 250 fluid

## 2020-05-23 NOTE — H&P (Signed)
Referring Physician:  [name] is an 64 y.o. female.                       Chief Complaint: Atrial fibrillation and shortness of breath  HPI: 64 years old white female was recently discharged from hospital for acute systolic and diastolic left heart failure. She denies chest pain, leg swelling or dietary non-compliance. She started feeling short of breath x 24 hours and came to ER as she was not feeling well. She was found to be in atrial fibrillation with RVR.  She has PMH of HTN, Hyperlipidemia, CAD, S/P CABG, S/P AV replacement, COPD, CKD IV, anxiety and tobacco use disorder.  Past Medical History:  Diagnosis Date  . Depression   . Diabetes mellitus without complication ([place])   . Heart disease, congenital   . High cholesterol   . Hypertension   . Panic attack   . Stroke [facility])       Past Surgical History:  Procedure Laterality Date  . CARDIAC SURGERY  2011   CABG  . COLONOSCOPY WITH PROPOFOL N/A 02/08/2018   Procedure: COLONOSCOPY WITH PROPOFOL;  Surgeon: Otis Brace, MD;  Location: [facility];  Service: Gastroenterology;  Laterality: N/A;  . COLONOSCOPY WITH PROPOFOL N/A 12/16/2018   Procedure: COLONOSCOPY WITH PROPOFOL;  Surgeon: Ronald Lobo, MD;  Location: [facility];  Service: Endoscopy;  Laterality: N/A;  . DILATION AND EVACUATION     Patient states she had an abortion 30 or 40 years ago  . ESOPHAGOGASTRODUODENOSCOPY (EGD) WITH PROPOFOL Left 02/05/2018   Procedure: ESOPHAGOGASTRODUODENOSCOPY (EGD) WITH PROPOFOL;  Surgeon: Otis Brace, MD;  Location: [facility];  Service: Gastroenterology;  Laterality: Left;  . IR ANGIOGRAM SELECTIVE EACH ADDITIONAL VESSEL  12/18/2018  . IR ANGIOGRAM SELECTIVE EACH ADDITIONAL VESSEL  12/18/2018  . IR ANGIOGRAM SELECTIVE EACH ADDITIONAL VESSEL  12/18/2018  . IR ANGIOGRAM SELECTIVE EACH ADDITIONAL VESSEL  12/18/2018  . IR ANGIOGRAM VISCERAL SELECTIVE  12/18/2018  . IR EMBO ART  VEN HEMORR LYMPH EXTRAV  INC GUIDE  ROADMAPPING  12/18/2018  . IR US GUIDE VASC ACCESS RIGHT  12/18/2018  . LEFT HEART CATH AND CORONARY ANGIOGRAPHY N/A 02/10/2020   Procedure: LEFT HEART CATH AND CORONARY ANGIOGRAPHY;  Surgeon: Dixie Dials, MD;  Location: [facility];  Service: Cardiovascular;  Laterality: N/A;  . WISDOM TOOTH EXTRACTION      Family History  Problem Relation Age of Onset  . Cancer Mother        uterian OR cervical  . Cancer Father        lung  . Leukemia Brother   . Cancer Sister        breast   Social History:  reports that she has been smoking cigarettes. She has a 50.00 pack-year smoking history. She has never used smokeless tobacco. She reports that she does not drink alcohol or use drugs.  Allergies:  Allergies  Allergen Reactions  . Amoxicillin-Pot Clavulanate Diarrhea    Has patient had a PCN reaction causing immediate rash, facial/tongue/throat swelling, SOB or lightheadedness with hypotension: Unknown Has patient had a PCN reaction causing severe rash involving mucus membranes or skin necrosis:UnknoWN Has patient had a PCN reaction that required hospitalization: Unknown Has patient had a PCN reaction occurring within the last 10 years: Unknown If all of the above answers are "NO", then may proceed with Cephalosporin use.   . Bupropion Nausea Only  . Nicotine Nausea Only and Rash    Reaction  to patches    (Not in a hospital admission)   Results for orders placed or performed during the hospital encounter of 05/23/20 (from the past 48 hour(s))  Basic metabolic panel     Status: Abnormal   Collection Time: 05/23/20  5:50 PM  Result Value Ref Range   Sodium 145 135 - 145 mmol/L   Potassium 3.9 3.5 - 5.1 mmol/L   Chloride 111 98 - 111 mmol/L   CO2 20 (L) 22 - 32 mmol/L   Glucose, Bld 144 (H) 70 - 99 mg/dL    Comment: Glucose reference range applies only to samples taken after fasting for at least 8 hours.   BUN 37 (H) 8 - 23 mg/dL   Creatinine, Ser 1.87 (H) 0.44 - 1.00 mg/dL    Calcium 9.4 8.9 - 10.3 mg/dL   GFR calc non Af Amer 28 (L) >60 mL/min   GFR calc Af Amer 32 (L) >60 mL/min   Anion gap 14 5 - 15    Comment: Performed at [facility] [address], [place], [place] [address]  CBC     Status: Abnormal   Collection Time: 05/23/20  5:50 PM  Result Value Ref Range   WBC 9.2 4.0 - 10.5 K/uL   RBC 3.79 (L) 3.87 - 5.11 MIL/uL   Hemoglobin 11.5 (L) 12.0 - 15.0 g/dL   HCT 37.9 36.0 - 46.0 %   MCV 100.0 80.0 - 100.0 fL   MCH 30.3 26.0 - 34.0 pg   MCHC 30.3 30.0 - 36.0 g/dL   RDW 18.9 (H) 11.5 - 15.5 %   Platelets 278 150 - 400 K/uL   nRBC 0.0 0.0 - 0.2 %    Comment: Performed at [facility], [place] [address], [place], [place] [address]  Brain natriuretic peptide     Status: Abnormal   Collection Time: 05/23/20  5:50 PM  Result Value Ref Range   B Natriuretic Peptide 3,252.0 (H) 0.0 - 100.0 pg/mL    Comment: Performed at [facility] [address] [place], [place] [address]  Troponin I (High Sensitivity)     Status: Abnormal   Collection Time: 05/23/20  5:50 PM  Result Value Ref Range   Troponin I (High Sensitivity) 140 (HH) <18 ng/L    Comment: CRITICAL RESULT CALLED TO, READ BACK BY AND VERIFIED WITH: RN J KOPP AT 1857 05/23/20 BY L BENFIELD (NOTE) Elevated high sensitivity troponin I (hsTnI) values and significant  changes across serial measurements may suggest ACS but many other  chronic and acute conditions are known to elevate hsTnI results.  Refer to the Links section for chest pain algorithms and additional  guidance. Performed at [facility], [place] [address] [place],  [address]   SARS Coronavirus 2 by RT PCR (hospital order, performed in [facility] hospital lab) Nasopharyngeal Nasopharyngeal Swab     Status: None   Collection Time: 05/23/20  5:50 PM   Specimen: Nasopharyngeal Swab  Result Value Ref Range   SARS Coronavirus 2 NEGATIVE NEGATIVE    Comment: (NOTE) SARS-CoV-2 target nucleic acids are NOT  DETECTED. The SARS-CoV-2 RNA is generally detectable in upper and lower respiratory specimens during the acute phase of infection. The lowest concentration of SARS-CoV-2 viral copies this assay can detect is 250 copies / mL. A negative result does not preclude SARS-CoV-2 infection and should not be used as the sole basis for treatment or other patient management decisions.  A negative  result may occur with improper specimen collection / handling, submission of specimen other than nasopharyngeal swab, presence of viral mutation(s) within the areas targeted by this assay, and inadequate number of viral copies (<250 copies / mL). A negative result must be combined with clinical observations, patient history, and epidemiological information. Fact Sheet for Patients:   StrictlyIdeas.no Fact Sheet for Healthcare Providers: BankingDealers.co.za This test is not yet approved or cleared  by the [place] FDA and has been authorized for detection and/or diagnosis of SARS-CoV-2 by FDA under an Emergency Use Authorization (EUA).  This EUA will remain in effect (meaning this test can be used) for the duration of the COVID-19 declaration under Section 564(b)(1) of the Act, 21 U.S.C. section 360bbb-3(b)(1), unless the authorization is terminated or revoked sooner. Performed at [facility], [place] [address] [place], [place] [address]   I-STAT 7, (LYTES, BLD GAS, ICA, H+H)     Status: Abnormal   Collection Time: 05/23/20  7:01 PM  Result Value Ref Range   pH, Arterial 7.453 (H) 7.350 - 7.450   pCO2 arterial 27.2 (L) 32.0 - 48.0 mmHg   pO2, Arterial 67 (L) 83.0 - 108.0 mmHg   Bicarbonate 19.1 (L) 20.0 - 28.0 mmol/L   TCO2 20 (L) 22 - 32 mmol/L   O2 Saturation 94.0 %   Acid-base deficit 4.0 (H) 0.0 - 2.0 mmol/L   Sodium 143 135 - 145 mmol/L   Potassium 3.6 3.5 - 5.1 mmol/L   Calcium, Ion 1.25 1.15 - 1.40 mmol/L   HCT 35.0 (L) 36.0 - 46.0 %    Hemoglobin 11.9 (L) 12.0 - 15.0 g/dL   Sample type ARTERIAL   Troponin I (High Sensitivity)     Status: Abnormal   Collection Time: 05/23/20  8:29 PM  Result Value Ref Range   Troponin I (High Sensitivity) 194 (HH) <18 ng/L    Comment: CRITICAL VALUE NOTED.  VALUE IS CONSISTENT WITH PREVIOUSLY REPORTED AND CALLED VALUE. (NOTE) Elevated high sensitivity troponin I (hsTnI) values and significant  changes across serial measurements may suggest ACS but many other  chronic and acute conditions are known to elevate hsTnI results.  Refer to the Links section for chest pain algorithms and additional  guidance. Performed at [facility], [place] [address] [place], [place] [address]    DG Chest Portable 1 View  Result Date: 05/23/2020 CLINICAL DATA:  Shortness of breath EXAM: PORTABLE CHEST 1 VIEW COMPARISON:  May 17, 2020 FINDINGS: Cardiomegaly and mild pulmonary venous congestion. No pneumothorax. No other changes. IMPRESSION: Cardiomegaly and mild pulmonary venous congestion. No other changes. Electronically Signed   By: Dorise Bullion III M.D   On: 05/23/2020 17:48    Review Of Systems Constitutional: No fever, chills, Some weight gain. Eyes: No vision change, wears glasses. No discharge or pain. Ears: No hearing loss, No tinnitus. Respiratory: Positive asthma, COPD, pneumonias, shortness of breath. No hemoptysis. Cardiovascular: Positive chest pain, palpitation, leg edema. Gastrointestinal: Positive nausea, vomiting, diarrhea, constipation. No GI bleed. No hepatitis. Genitourinary: No dysuria, hematuria, kidney stone. No incontinance. CKD, IV Neurological: Positive headache, stroke, no seizures.  Psychiatry: No psych facility admission for anxiety, depression, suicide. No detox. Skin: No rash. Musculoskeletal: Positive joint pain, fibromyalgia, neck pain, back pain. Lymphadenopathy: No lymphadenopathy. Hematology: Positive anemia or easy bruising.   Blood pressure (!) 146/77,  pulse (!) 114, resp. rate (!) 30, height 5\' 3"  (1.6 m), weight 74.3 kg, SpO2 95 %. Body mass index is 29.02 kg/m.  General appearance: alert, cooperative, appears stated age and no distress Head: Normocephalic, atraumatic. Eyes: Blue eyes, Pale pink conjunctiva, corneas clear. PERRL, EOM's intact. Neck: No adenopathy, no carotid bruit, no JVD, supple, symmetrical, trachea midline and thyroid not enlarged. Resp: Clearing to auscultation bilaterally. Cardio: Irregular rate and rhythm, S1, S2 normal, II/VI systolic murmur, no click, rub or gallop GI: Soft, non-tender; bowel sounds normal; no organomegaly. Extremities: Trace edema, no cyanosis or clubbing. Skin: Warm and dry.  Neurologic: Alert and oriented X 3, normal strength. Normal coordination.  Assessment/Plan Atrial fibrillation with RVR, CHA2DS2VASc score of 5 Chronic systolic and diastolic left heart failure CKD, IV COPD with hypoxemia Hypertension S/P CABG Severe disease of sequential SVG to PDA and OM S/P bioprosthetic AV Severe MR Moderate TR Mild pulmonary systolic hypertension Hyperlipidemia S/P stroke, left cerebellar and Pons area Obesity Panic attack Right shoulder pain, Chronic  Admit. IV diltiazem. IV lasix. Home medications.  Time spent: Review of old records, Lab, x-rays, EKG, other cardiac tests, examination, discussion with patient, nurse and ER physician over 70 minutes.  [name], MD  05/23/2020, 9:38 PM

## 2020-05-23 NOTE — ED Notes (Signed)
Checked in on Pt. She was alert and oriented and able to follow commands. Pt stated that she was doing fine.

## 2020-05-23 NOTE — ED Provider Notes (Addendum)
[facility] EMERGENCY DEPARTMENT Provider Note   CSN: [id number] Arrival date & time: 05/23/20  1711     History Chief Complaint  Patient presents with  . Shortness of Breath  . Atrial Fibrillation    Renee Griffin is a 64 y.o. female.  HPI   Patient presents emergency room for evaluation of shortness of breath. Patient was recently admitted to the hospital on May 23. He was discharged on May 27. Patient states she was feeling better when she initially left the hospital. She started feeling short of breath again yesterday. Patient denies any of any fevers. She denies any chest pain. Denies any leg swelling. Patient does smoke. She is not sure if she has been using her inhalers any more than usual. Patient is on home oxygen. She does not know how much she usually is on. She was feeling worse so she called [facility]  Past Medical History:  Diagnosis Date  . Depression   . Diabetes mellitus without complication ([place])   . Heart disease, congenital   . High cholesterol   . Hypertension   . Panic attack   . Stroke [facility])     Patient Active Problem List   Diagnosis Date Noted  . Acute congestive heart failure ([place]) 05/17/2020  . NSTEMI (non-ST elevated myocardial infarction) ([place]) 02/07/2020  . Acute systolic heart failure ([place]) 01/19/2020  . Acute lower GI bleeding 12/16/2018  . Dehydration 04/13/2018  . Ovarian mass, left   . Abdominal pain 02/01/2018  . Essential hypertension 03/04/2016  . HLD (hyperlipidemia) 03/04/2016  . Type 2 diabetes mellitus with circulatory disorder ([place]) 03/04/2016  . Obesity 03/04/2016  . CVA (cerebral vascular accident) ([place]) 03/05/2015  . Bilateral low back pain without sciatica 03/05/2015  . Essential hypertension, benign 03/05/2015  . Tobacco use disorder 03/05/2015  . Hyperlipidemia 03/05/2015  . Hyperparathyroidism, primary ([place]) 02/13/2013  . Generalized ischemic cerebrovascular disease 02/06/2013  . Hyperreflexia 02/06/2013   . Abnormality of gait 02/06/2013  . Disturbance of skin sensation 02/06/2013  . CVA (cerebral infarction) 06/03/2012  . HTN (hypertension) 06/03/2012  . Dyslipidemia 06/03/2012  . Anxiety 06/03/2012    Past Surgical History:  Procedure Laterality Date  . CARDIAC SURGERY  2011   CABG  . COLONOSCOPY WITH PROPOFOL N/A 02/08/2018   Procedure: COLONOSCOPY WITH PROPOFOL;  Surgeon: Otis Brace, MD;  Location: [facility];  Service: Gastroenterology;  Laterality: N/A;  . COLONOSCOPY WITH PROPOFOL N/A 12/16/2018   Procedure: COLONOSCOPY WITH PROPOFOL;  Surgeon: Ronald Lobo, MD;  Location: [facility];  Service: Endoscopy;  Laterality: N/A;  . DILATION AND EVACUATION     Patient states she had an abortion 30 or 40 years ago  . ESOPHAGOGASTRODUODENOSCOPY (EGD) WITH PROPOFOL Left 02/05/2018   Procedure: ESOPHAGOGASTRODUODENOSCOPY (EGD) WITH PROPOFOL;  Surgeon: Otis Brace, MD;  Location: [facility];  Service: Gastroenterology;  Laterality: Left;  . IR ANGIOGRAM SELECTIVE EACH ADDITIONAL VESSEL  12/18/2018  . IR ANGIOGRAM SELECTIVE EACH ADDITIONAL VESSEL  12/18/2018  . IR ANGIOGRAM SELECTIVE EACH ADDITIONAL VESSEL  12/18/2018  . IR ANGIOGRAM SELECTIVE EACH ADDITIONAL VESSEL  12/18/2018  . IR ANGIOGRAM VISCERAL SELECTIVE  12/18/2018  . IR EMBO ART  VEN HEMORR LYMPH EXTRAV  INC GUIDE ROADMAPPING  12/18/2018  . IR US GUIDE VASC ACCESS RIGHT  12/18/2018  . LEFT HEART CATH AND CORONARY ANGIOGRAPHY N/A 02/10/2020   Procedure: LEFT HEART CATH AND CORONARY ANGIOGRAPHY;  Surgeon: Dixie Dials, MD;  Location: [facility];  Service: Cardiovascular;  Laterality: N/A;  . WISDOM TOOTH EXTRACTION       OB History    Gravida  1   Para      Term      Preterm      AB  1   Living  0     SAB      TAB  1   Ectopic      Multiple      Live Births              Family History  Problem Relation Age of Onset  . Cancer Mother        uterian OR cervical  . Cancer  Father        lung  . Leukemia Brother   . Cancer Sister        breast    Social History   Tobacco Use  . Smoking status: Current Every Day Smoker    Packs/day: 1.00    Years: 50.00    Pack years: 50.00    Types: Cigarettes  . Smokeless tobacco: Never Used  Substance Use Topics  . Alcohol use: No    Alcohol/week: 0.0 standard drinks  . Drug use: No    Home Medications Prior to Admission medications   Medication Sig Start Date End Date Taking? Authorizing Provider  ACCU-CHEK AVIVA PLUS test strip See admin instructions. 01/29/16   [provider]  ACCU-CHEK SOFTCLIX LANCETS lancets See admin instructions. 01/30/16   [provider]  acetaminophen (TYLENOL) 325 MG tablet Take 2 tablets (650 mg total) by mouth every 4 (four) hours as needed for headache or mild pain. 01/22/20   Dixie Dials, MD  albuterol (VENTOLIN HFA) 108 (90 Base) MCG/ACT inhaler Inhale 2 puffs into the lungs every 6 (six) hours as needed for wheezing or shortness of breath. 01/22/20   Dixie Dials, MD  allopurinol (ZYLOPRIM) 100 MG tablet Take 1 tablet (100 mg total) by mouth daily. 01/23/20   Dixie Dials, MD  ALPRAZolam Duanne Moron) 0.5 MG tablet Take 1 tablet (0.5 mg total) by mouth 2 (two) times daily as needed for anxiety. Patient taking differently: Take 0.5 mg by mouth 3 (three) times daily.  02/24/20   Virgel Manifold, MD  apixaban (ELIQUIS) 2.5 MG TABS tablet Take 1 tablet (2.5 mg total) by mouth 2 (two) times daily. 01/22/20   Dixie Dials, MD  aspirin EC 81 MG tablet Take 1 tablet (81 mg total) by mouth daily. 01/22/20 01/21/21  Dixie Dials, MD  Blood Glucose Monitoring Suppl (ACCU-CHEK AVIVA PLUS) w/Device KIT See admin instructions. 01/29/16   [provider]  cholecalciferol (VITAMIN D3) 25 MCG (1000 UT) tablet Take 1,000 Units by mouth daily.    [provider]  diltiazem (CARDIZEM) 60 MG tablet Take 1 tablet (60 mg total) by mouth 2 (two) times daily. 02/14/20   Dixie Dials,  MD  ferrous sulfate 325 (65 FE) MG tablet Take 1 tablet (325 mg total) by mouth daily with breakfast. 02/15/20   Dixie Dials, MD  gemfibrozil (LOPID) 600 MG tablet Take 600 mg by mouth 2 (two) times daily. 02/19/18   [provider]  loperamide (IMODIUM) 2 MG capsule Take 1 capsule (2 mg total) by mouth 4 (four) times daily as needed for diarrhea or loose stools. Patient taking differently: Take 2 mg by mouth daily as needed for diarrhea or loose stools.  02/24/19   Julianne Rice, MD  metoprolol tartrate (LOPRESSOR) 25 MG tablet Take 0.5 tablets (12.5 mg  total) by mouth 2 (two) times daily. 12/25/18   Dixie Dials, MD  Multiple Vitamin (MULTIVITAMIN WITH MINERALS) TABS tablet Take 1 tablet by mouth daily.    [provider]  nitroGLYCERIN (NITROSTAT) 0.4 MG SL tablet Place 1 tablet (0.4 mg total) under the tongue every 5 (five) minutes x 3 doses as needed for chest pain. 02/14/20   Dixie Dials, MD  ondansetron (ZOFRAN) 4 MG tablet Take 1 tablet (4 mg total) by mouth every 6 (six) hours as needed for nausea or vomiting. 02/24/19   Julianne Rice, MD  oxymetazoline (AFRIN) 0.05 % nasal spray Place 1 spray into both nostrils 2 (two) times daily.    [provider]  pantoprazole (PROTONIX) 40 MG tablet Take 1 tablet (40 mg total) by mouth 2 (two) times daily. Patient taking differently: Take 40 mg by mouth 2 (two) times daily as needed (heartburn).  02/09/18   Dixie Dials, MD  potassium chloride (KLOR-CON) 10 MEQ tablet Take 10 mEq by mouth daily.    [provider]  prazosin (MINIPRESS) 1 MG capsule Take 1 capsule (1 mg total) by mouth 2 (two) times daily. 02/09/18   Dixie Dials, MD  promethazine (PHENERGAN) 25 MG tablet Take 1 tablet (25 mg total) by mouth every 6 (six) hours as needed for nausea or vomiting. 02/24/20   Virgel Manifold, MD  sertraline (ZOLOFT) 100 MG tablet Take 100 mg by mouth at bedtime.     [provider]  tiZANidine (ZANAFLEX) 2 MG  tablet Take 1 tablet (2 mg total) by mouth at bedtime as needed for muscle spasms. 12/25/18   Dixie Dials, MD  torsemide (DEMADEX) 20 MG tablet Take 1 tablet (20 mg total) by mouth daily. 05/21/20   Dixie Dials, MD  valACYclovir (VALTREX) 500 MG tablet Take 500 mg by mouth daily.    [provider]    Allergies    Amoxicillin-pot clavulanate, Bupropion, and Nicotine  Review of Systems   Review of Systems  All other systems reviewed and are negative.   Physical Exam Updated Vital Signs BP (!) 148/117   Pulse (!) 131   Resp 20   Ht 1.6 m ('5\' 3"' )   Wt 74.3 kg   SpO2 95%   BMI 29.02 kg/m   Physical Exam Vitals and nursing note reviewed.  Constitutional:      General: She is not in acute distress.    Appearance: She is ill-appearing.  HENT:     Head: Normocephalic and atraumatic.     Right Ear: External ear normal.     Left Ear: External ear normal.  Eyes:     General: No scleral icterus.       Right eye: No discharge.        Left eye: No discharge.     Conjunctiva/sclera: Conjunctivae normal.  Neck:     Trachea: No tracheal deviation.  Cardiovascular:     Rate and Rhythm: Tachycardia present. Rhythm irregular.  Pulmonary:     Effort: Pulmonary effort is normal. No respiratory distress.     Breath sounds: No stridor. Wheezing and rales present.  Abdominal:     General: Bowel sounds are normal. There is no distension.     Palpations: Abdomen is soft.     Tenderness: There is no abdominal tenderness. There is no guarding or rebound.  Musculoskeletal:        General: No tenderness.     Cervical back: Neck supple.  Skin:    General: Skin  is warm and dry.     Findings: No rash.  Neurological:     Mental Status: She is alert.     Cranial Nerves: No cranial nerve deficit (no facial droop, extraocular movements intact, no slurred speech).     Sensory: No sensory deficit.     Motor: No abnormal muscle tone or seizure activity.     Coordination: Coordination  normal.     ED Results / Procedures / Treatments   Labs (all labs ordered are listed, but only abnormal results are displayed) Labs Reviewed  BASIC METABOLIC PANEL - Abnormal; Notable for the following components:      Result Value   CO2 20 (*)    Glucose, Bld 144 (*)    BUN 37 (*)    Creatinine, Ser 1.87 (*)    GFR calc non Af Amer 28 (*)    GFR calc Af Amer 32 (*)    All other components within normal limits  CBC - Abnormal; Notable for the following components:   RBC 3.79 (*)    Hemoglobin 11.5 (*)    RDW 18.9 (*)    All other components within normal limits  BRAIN NATRIURETIC PEPTIDE - Abnormal; Notable for the following components:   B Natriuretic Peptide 3,252.0 (*)    All other components within normal limits  POCT I-STAT 7, (LYTES, BLD GAS, ICA,H+H) - Abnormal; Notable for the following components:   pH, Arterial 7.453 (*)    pCO2 arterial 27.2 (*)    pO2, Arterial 67 (*)    Bicarbonate 19.1 (*)    TCO2 20 (*)    Acid-base deficit 4.0 (*)    HCT 35.0 (*)    Hemoglobin 11.9 (*)    All other components within normal limits  TROPONIN I (HIGH SENSITIVITY) - Abnormal; Notable for the following components:   Troponin I (High Sensitivity) 140 (*)    All other components within normal limits  SARS CORONAVIRUS 2 BY RT PCR (HOSPITAL ORDER, [facility])  I-STAT ARTERIAL BLOOD GAS, ED  TROPONIN I (HIGH SENSITIVITY)    EKG EKG Interpretation  Date/Time:  Saturday May 23 2020 17:22:29 EDT Ventricular Rate:  121 PR Interval:    QRS Duration: 98 QT Interval:  325 QTC Calculation: 446 R Axis:   84 Text Interpretation: Atrial fibrillation Borderline right axis deviation Anteroseptal infarct, old Nonspecific repol abnormality, diffuse leads No significant change since last tracing Confirmed by Dorie Rank [phone]) on 05/23/2020 5:38:52 PM   Radiology DG Chest Portable 1 View  Result Date: 05/23/2020 CLINICAL DATA:  Shortness of breath EXAM:  PORTABLE CHEST 1 VIEW COMPARISON:  May 17, 2020 FINDINGS: Cardiomegaly and mild pulmonary venous congestion. No pneumothorax. No other changes. IMPRESSION: Cardiomegaly and mild pulmonary venous congestion. No other changes. Electronically Signed   By: Dorise Bullion III M.D   On: 05/23/2020 17:48    Procedures .Critical Care Performed by: Dorie Rank, MD Authorized by: Dorie Rank, MD   Critical care provider statement:    Critical care time (minutes):  45   Critical care was time spent personally by me on the following activities:  Discussions with consultants, evaluation of patient's response to treatment, examination of patient, ordering and performing treatments and interventions, ordering and review of laboratory studies, ordering and review of radiographic studies, pulse oximetry, re-evaluation of patient's condition, obtaining history from patient or surrogate and review of old charts   (including critical care time)  Medications Ordered in ED Medications  diltiazem (CARDIZEM) 1 mg/mL load via infusion 15 mg (has no administration in time range)    And  diltiazem (CARDIZEM) 125 mg in dextrose 5% 125 mL (1 mg/mL) infusion (has no administration in time range)  albuterol (VENTOLIN HFA) 108 (90 Base) MCG/ACT inhaler 4 puff (4 puffs Inhalation Given 05/23/20 1754)  methylPREDNISolone sodium succinate (SOLU-MEDROL) 125 mg/2 mL injection 125 mg (125 mg Intravenous Given 05/23/20 1752)  diltiazem (CARDIZEM) injection 15 mg (15 mg Intravenous Given 05/23/20 1928)  furosemide (LASIX) injection 40 mg (40 mg Intravenous Given 05/23/20 1925)  acetaminophen (TYLENOL) tablet 650 mg (650 mg Oral Given 05/23/20 1936)  LORazepam (ATIVAN) tablet 1 mg (1 mg Oral Given 05/23/20 1936)    ED Course  I have reviewed the triage vital signs and the nursing notes.  Pertinent labs & imaging results that were available during my care of the patient were reviewed by me and considered in my medical decision making  (see chart for details).  Clinical Course as of May 23 2102  Sat May 23, 2020  1910 Labs reviewed.  ABG does show hypoxia but no signs of respiratory acidosis.  Patient's [place] panel shows stable renal insufficiency.   [JK]  1911 BC is unremarkable.  Troponin is elevated at 140.  This is increased from previous.   [JK]  1911 X-ray suggest pulmonary venous congestion.   [JK]  1912 Patient's heart rate is more tachycardic.  Have ordered a dose of Cardizem.  Dose of Lasix also ordered   [JK]  2033 BNP elevated.     [JK]  2037 Patient remains tachycardic despite initial IV Cardizem.  I will start her on an infusion   [JK]  2037 Case discussed with Dr. Doylene Canard   [JK]    Clinical Course User Index [JK] Dorie Rank, MD   MDM Rules/Calculators/A&P                     MDM Number of Diagnoses or Management Options Acute on chronic congestive heart failure, unspecified heart failure type [facility]): established, worsening Atrial fibrillation with rapid ventricular response ([place]): established, worsening Chronic obstructive pulmonary disease, unspecified COPD type ([place]): established, worsening   Amount and/or Complexity of Data Reviewed Clinical lab tests: ordered and reviewed Tests in the radiology section of CPT: ordered and reviewed Tests in the medicine section of CPT: ordered and reviewed Discussion of test results with the performing providers: yes Decide to obtain previous medical records or to obtain history from someone other than the patient: yes Obtain history from someone other than the patient: yes Review and summarize past medical records: yes Discuss the patient with other providers: yes Independent visualization of images, tracings, or specimens: yes  Risk of Complications, Morbidity, and/or Mortality Presenting problems: high Diagnostic procedures: high Management options: high    Patient presented to ED with recurrent shortness of breath after recent hospitalization.  Patient's ED work-up is notable for elevated troponin and BNP. I suspect the troponin is related to the congestive heart failure and not cardiac ischemia. Patient also has atrial fibrillation with rapid rate. She was started on Cardizem IV without initial improvement and then was changed to an IV infusion. Patient is on anticoagulation. Patient also likely has a component of COPD as she is a chronic smoker. She was treated with albuterol as well as Lasix. Case discussed with Dr. Doylene Canard. Plan on admission for further treatment. Final Clinical Impression(s) / ED Diagnoses Final diagnoses:  Acute on chronic congestive heart failure,  unspecified heart failure type ([place])  Chronic obstructive pulmonary disease, unspecified COPD type ([place])  Atrial fibrillation, rapid ([place])  Atrial fibrillation with rapid ventricular response ([facility])          Dorie Rank, MD 05/23/20 2117

## 2020-05-23 NOTE — ED Notes (Signed)
Requested pharmacy to verify night medications to be able to give to pt.

## 2020-05-24 LAB — BASIC METABOLIC PANEL
Anion gap: 14 (ref 5–15)
BUN: 37 mg/dL — ABNORMAL HIGH (ref 8–23)
CO2: 17 mmol/L — ABNORMAL LOW (ref 22–32)
Calcium: 8.9 mg/dL (ref 8.9–10.3)
Chloride: 110 mmol/L (ref 98–111)
Creatinine, Ser: 1.93 mg/dL — ABNORMAL HIGH (ref 0.44–1.00)
GFR calc Af Amer: 31 mL/min — ABNORMAL LOW (ref >60)
GFR calc non Af Amer: 27 mL/min — ABNORMAL LOW (ref >60)
Glucose, Bld: 165 mg/dL — ABNORMAL HIGH (ref 70–99)
Potassium: 4 mmol/L (ref 3.5–5.1)
Sodium: 141 mmol/L (ref 135–145)

## 2020-05-24 LAB — GLUCOSE, CAPILLARY
Glucose-Capillary: 122 mg/dL — ABNORMAL HIGH (ref 70–99)
Glucose-Capillary: 172 mg/dL — ABNORMAL HIGH (ref 70–99)
Glucose-Capillary: 122 mg/dL — ABNORMAL HIGH (ref 70–99)

## 2020-05-24 LAB — CBC
HCT: 33.9 % — ABNORMAL LOW (ref 36.0–46.0)
Hemoglobin: 10.4 g/dL — ABNORMAL LOW (ref 12.0–15.0)
MCH: 30.4 pg (ref 26.0–34.0)
MCHC: 30.7 g/dL (ref 30.0–36.0)
MCV: 99.1 fL (ref 80.0–100.0)
Platelets: 240 10*3/uL (ref 150–400)
RBC: 3.42 MIL/uL — ABNORMAL LOW (ref 3.87–5.11)
RDW: 18.6 % — ABNORMAL HIGH (ref 11.5–15.5)
WBC: 5.4 10*3/uL (ref 4.0–10.5)
nRBC: 0 % (ref 0.0–0.2)

## 2020-05-24 MED ORDER — INSULIN ASPART 100 UNIT/ML ~~LOC~~ SOLN
0.0000 [IU] | Freq: Three times a day (TID) | SUBCUTANEOUS | Status: DC
  Administered 2020-05-24 – 2020-05-25 (×2): 3 [IU] via SUBCUTANEOUS
  Administered 2020-05-25 – 2020-05-26 (×2): 2 [IU] via SUBCUTANEOUS

## 2020-05-24 MED ORDER — DILTIAZEM HCL ER COATED BEADS 120 MG PO CP24
120.0000 mg | ORAL_CAPSULE | Freq: Every day | ORAL | Status: DC
  Administered 2020-05-24 – 2020-05-27 (×4): 120 mg via ORAL
  Filled 2020-05-24 (×4): qty 1

## 2020-05-24 MED ORDER — ALUM & MAG HYDROXIDE-SIMETH 200-200-20 MG/5ML PO SUSP
30.0000 mL | ORAL | Status: DC | PRN
  Administered 2020-05-24: 30 mL via ORAL
  Filled 2020-05-24: qty 30

## 2020-05-24 MED ORDER — OFF THE BEAT BOOK
Freq: Once | Status: AC
  Filled 2020-05-24: qty 1

## 2020-05-24 MED ORDER — DIGOXIN 125 MCG PO TABS
0.1250 mg | ORAL_TABLET | Freq: Every day | ORAL | Status: AC
  Administered 2020-05-24 – 2020-05-27 (×4): 0.125 mg via ORAL
  Filled 2020-05-24 (×4): qty 1

## 2020-05-24 MED ORDER — FUROSEMIDE 10 MG/ML IJ SOLN
40.0000 mg | Freq: Every day | INTRAMUSCULAR | Status: DC
  Administered 2020-05-25 – 2020-05-26 (×2): 40 mg via INTRAVENOUS
  Filled 2020-05-24 (×2): qty 4

## 2020-05-24 MED ORDER — LIVING WELL WITH DIABETES BOOK
Freq: Once | Status: AC
  Filled 2020-05-24: qty 1

## 2020-05-24 NOTE — ED Notes (Signed)
Lunch Tray Ordered @ 1104.  

## 2020-05-24 NOTE — ED Notes (Signed)
SDU Breakfast Ordered 

## 2020-05-24 NOTE — Progress Notes (Signed)
Ref: Dixie Dials, MD   Subjective:  Improved heart rate. On oral diltiazem, digoxin and metoprolol for rate control.  Objective:  Vital Signs in the last 24 hours: Temp:  [97.8 F (36.6 C)] 97.8 F (36.6 C) (05/29 2338) Pulse Rate:  [46-150] 96 (05/30 0800) Cardiac Rhythm: Normal sinus rhythm (05/30 0809) Resp:  [16-30] 28 (05/30 0800) BP: (102-157)/(41-119) 102/41 (05/30 0800) SpO2:  [92 %-100 %] 95 % (05/30 0800) Weight:  [74.3 kg] 74.3 kg (05/29 1721)  Physical Exam: BP Readings from Last 1 Encounters:  05/24/20 (!) 102/41     Wt Readings from Last 1 Encounters:  05/23/20 74.3 kg    Weight change:  Body mass index is 29.02 kg/m. HEENT: [place]/AT, Eyes-Blue, PERL, EOMI, Conjunctiva-Pale pink, Sclera-Non-icteric Neck: No JVD, No bruit, Trachea midline. Lungs:  Clear, Bilateral. Cardiac:  Regular rhythm, normal S1 and S2, no S3. II/VI systolic murmur. Abdomen:  Soft, non-tender. BS present. Extremities:  No edema present. No cyanosis. No clubbing. CNS: AxOx3, Cranial nerves grossly intact, moves all 4 extremities.  Skin: Warm and dry.   Intake/Output from previous day: No intake/output data recorded.    Lab Results: BMET    Component Value Date/Time   NA 141 05/24/2020 0524   NA 143 05/23/2020 1901   NA 145 05/23/2020 1750   K 4.0 05/24/2020 0524   K 3.6 05/23/2020 1901   K 3.9 05/23/2020 1750   CL 110 05/24/2020 0524   CL 111 05/23/2020 1750   CL 106 05/21/2020 0440   CO2 17 (L) 05/24/2020 0524   CO2 20 (L) 05/23/2020 1750   CO2 21 (L) 05/21/2020 0440   GLUCOSE 165 (H) 05/24/2020 0524   GLUCOSE 144 (H) 05/23/2020 1750   GLUCOSE 111 (H) 05/21/2020 0440   BUN 37 (H) 05/24/2020 0524   BUN 37 (H) 05/23/2020 1750   BUN 47 (H) 05/21/2020 0440   CREATININE 1.93 (H) 05/24/2020 0524   CREATININE 1.87 (H) 05/23/2020 1750   CREATININE 2.23 (H) 05/21/2020 0440   CALCIUM 8.9 05/24/2020 0524   CALCIUM 9.4 05/23/2020 1750   CALCIUM 9.5 05/21/2020 0440   GFRNONAA  27 (L) 05/24/2020 0524   GFRNONAA 28 (L) 05/23/2020 1750   GFRNONAA 23 (L) 05/21/2020 0440   GFRAA 31 (L) 05/24/2020 0524   GFRAA 32 (L) 05/23/2020 1750   GFRAA 26 (L) 05/21/2020 0440   CBC    Component Value Date/Time   WBC 5.4 05/24/2020 0524   RBC 3.42 (L) 05/24/2020 0524   HGB 10.4 (L) 05/24/2020 0524   HCT 33.9 (L) 05/24/2020 0524   PLT 240 05/24/2020 0524   MCV 99.1 05/24/2020 0524   MCH 30.4 05/24/2020 0524   MCHC 30.7 05/24/2020 0524   RDW 18.6 (H) 05/24/2020 0524   LYMPHSABS 1.2 05/17/2020 0100   MONOABS 0.6 05/17/2020 0100   EOSABS 0.2 05/17/2020 0100   BASOSABS 0.1 05/17/2020 0100   HEPATIC Function Panel Recent Labs    02/07/20 1542 02/13/20 0346 05/17/20 0100  PROT 7.0 6.1* 6.1*   HEMOGLOBIN A1C No components found for: HGA1C,  MPG CARDIAC ENZYMES Lab Results  Component Value Date   CKTOTAL 735 (H) 02/01/2018   BNP No results for input(s): PROBNP in the last 8760 hours. TSH Recent Labs    01/19/20 1250 05/17/20 1734  TSH 2.243 1.710   CHOLESTEROL Recent Labs    02/08/20 0257  CHOL 202*    Scheduled Meds: . allopurinol  100 mg Oral Daily  . apixaban  2.5  mg Oral BID  . aspirin EC  81 mg Oral Daily  . cholecalciferol  1,000 Units Oral Daily  . digoxin  0.125 mg Oral Daily  . diltiazem  120 mg Oral Daily  . ferrous sulfate  325 mg Oral Q breakfast  . [START ON 05/25/2020] furosemide  40 mg Intravenous Daily  . gemfibrozil  600 mg Oral BID  . metoprolol tartrate  12.5 mg Oral BID  . multivitamin with minerals  1 tablet Oral Daily  . oxymetazoline  1 spray Each Nare BID  . pantoprazole  40 mg Oral BID  . potassium chloride  10 mEq Oral BID  . prazosin  1 mg Oral BID  . sertraline  100 mg Oral QHS  . sodium chloride flush  3 mL Intravenous Q12H  . valACYclovir  500 mg Oral Daily   Continuous Infusions: . sodium chloride     PRN Meds:.sodium chloride, acetaminophen, albuterol, ALPRAZolam, alum & mag hydroxide-simeth, loperamide,  nitroGLYCERIN, ondansetron, promethazine, sodium chloride flush, tiZANidine  Assessment/Plan: Atrial fibrillation with controlled ventricular response Acute on Chronic systolic and diastolic left heart failure CKD, IV Anemia of chronic disease COPD with hypoxemia S/P CABG Severe disease of sequential SVG to PDA and OM S/P bioprosthetic AV Severe MR Moderate TR Mild pulmonary systolic hypertension Hyperlipidemia S/P stroke Obesity, Panic attack Chronic right shoulder pain  Change IV diltiazem to PO. Add small dose digoxin. Continue slow diuresis. Increase activity.   LOS: 1 day   Time spent including chart review, lab review, examination, discussion with patient : 30 min   Dixie Dials  MD  05/24/2020, 12:19 PM

## 2020-05-24 NOTE — ED Notes (Signed)
Secretary requested to page admitting r/t admission order

## 2020-05-25 LAB — GLUCOSE, CAPILLARY
Glucose-Capillary: 110 mg/dL — ABNORMAL HIGH (ref 70–99)
Glucose-Capillary: 149 mg/dL — ABNORMAL HIGH (ref 70–99)
Glucose-Capillary: 181 mg/dL — ABNORMAL HIGH (ref 70–99)
Glucose-Capillary: 113 mg/dL — ABNORMAL HIGH (ref 70–99)

## 2020-05-25 MED ORDER — ALPRAZOLAM 0.5 MG PO TABS
0.5000 mg | ORAL_TABLET | Freq: Three times a day (TID) | ORAL | Status: DC
  Administered 2020-05-25 – 2020-05-27 (×6): 0.5 mg via ORAL
  Filled 2020-05-25 (×6): qty 1

## 2020-05-25 NOTE — Progress Notes (Signed)
Ref: Dixie Dials, MD   Subjective:  Improving heart rate. Admits to salt food intake 3 days back.   Objective:  Vital Signs in the last 24 hours: Temp:  [97.4 F (36.3 C)-98.2 F (36.8 C)] 97.4 F (36.3 C) (05/31 0831) Pulse Rate:  [80-100] 100 (05/31 1014) Cardiac Rhythm: Atrial fibrillation (05/31 0812) Resp:  [16-21] 18 (05/31 0831) BP: (90-172)/(60-105) 128/105 (05/31 0831) SpO2:  [91 %-100 %] 95 % (05/31 0500) Weight:  [72.4 kg-72.7 kg] 72.7 kg (05/31 0436)  Physical Exam: BP Readings from Last 1 Encounters:  05/25/20 (!) 128/105     Wt Readings from Last 1 Encounters:  05/25/20 72.7 kg    Weight change: -1.9 kg Body mass index is 28.38 kg/m. HEENT: [place]/AT, Eyes-Blue, PERL, EOMI, Conjunctiva-Pale pink, Sclera-Non-icteric Neck: No JVD, No bruit, Trachea midline. Lungs:  Clearing, Bilateral. Cardiac:  Regular rhythm, normal S1 and S2, no S3. II/VI systolic murmur. Abdomen:  Soft, non-tender. BS present. Extremities:  No edema present. No cyanosis. No clubbing. CNS: AxOx3, Cranial nerves grossly intact, moves all 4 extremities.  Skin: Warm and dry.   Intake/Output from previous day: 05/30 0701 - 05/31 0700 In: 120 [P.O.:120] Out: 320 [Urine:320]    Lab Results: BMET    Component Value Date/Time   NA 141 05/24/2020 0524   NA 143 05/23/2020 1901   NA 145 05/23/2020 1750   K 4.0 05/24/2020 0524   K 3.6 05/23/2020 1901   K 3.9 05/23/2020 1750   CL 110 05/24/2020 0524   CL 111 05/23/2020 1750   CL 106 05/21/2020 0440   CO2 17 (L) 05/24/2020 0524   CO2 20 (L) 05/23/2020 1750   CO2 21 (L) 05/21/2020 0440   GLUCOSE 165 (H) 05/24/2020 0524   GLUCOSE 144 (H) 05/23/2020 1750   GLUCOSE 111 (H) 05/21/2020 0440   BUN 37 (H) 05/24/2020 0524   BUN 37 (H) 05/23/2020 1750   BUN 47 (H) 05/21/2020 0440   CREATININE 1.93 (H) 05/24/2020 0524   CREATININE 1.87 (H) 05/23/2020 1750   CREATININE 2.23 (H) 05/21/2020 0440   CALCIUM 8.9 05/24/2020 0524   CALCIUM 9.4  05/23/2020 1750   CALCIUM 9.5 05/21/2020 0440   GFRNONAA 27 (L) 05/24/2020 0524   GFRNONAA 28 (L) 05/23/2020 1750   GFRNONAA 23 (L) 05/21/2020 0440   GFRAA 31 (L) 05/24/2020 0524   GFRAA 32 (L) 05/23/2020 1750   GFRAA 26 (L) 05/21/2020 0440   CBC    Component Value Date/Time   WBC 5.4 05/24/2020 0524   RBC 3.42 (L) 05/24/2020 0524   HGB 10.4 (L) 05/24/2020 0524   HCT 33.9 (L) 05/24/2020 0524   PLT 240 05/24/2020 0524   MCV 99.1 05/24/2020 0524   MCH 30.4 05/24/2020 0524   MCHC 30.7 05/24/2020 0524   RDW 18.6 (H) 05/24/2020 0524   LYMPHSABS 1.2 05/17/2020 0100   MONOABS 0.6 05/17/2020 0100   EOSABS 0.2 05/17/2020 0100   BASOSABS 0.1 05/17/2020 0100   HEPATIC Function Panel Recent Labs    02/07/20 1542 02/13/20 0346 05/17/20 0100  PROT 7.0 6.1* 6.1*   HEMOGLOBIN A1C No components found for: HGA1C,  MPG CARDIAC ENZYMES Lab Results  Component Value Date   CKTOTAL 735 (H) 02/01/2018   BNP No results for input(s): PROBNP in the last 8760 hours. TSH Recent Labs    01/19/20 1250 05/17/20 1734  TSH 2.243 1.710   CHOLESTEROL Recent Labs    02/08/20 0257  CHOL 202*    Scheduled Meds: .  allopurinol  100 mg Oral Daily  . apixaban  2.5 mg Oral BID  . aspirin EC  81 mg Oral Daily  . cholecalciferol  1,000 Units Oral Daily  . digoxin  0.125 mg Oral Daily  . diltiazem  120 mg Oral Daily  . ferrous sulfate  325 mg Oral Q breakfast  . furosemide  40 mg Intravenous Daily  . gemfibrozil  600 mg Oral BID  . insulin aspart  0-15 Units Subcutaneous TID WC  . metoprolol tartrate  12.5 mg Oral BID  . multivitamin with minerals  1 tablet Oral Daily  . oxymetazoline  1 spray Each Nare BID  . pantoprazole  40 mg Oral BID  . potassium chloride  10 mEq Oral BID  . prazosin  1 mg Oral BID  . sertraline  100 mg Oral QHS  . sodium chloride flush  3 mL Intravenous Q12H  . valACYclovir  500 mg Oral Daily   Continuous Infusions: . 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   LOS: 2 days   Time spent including chart review, lab review, examination, discussion with patient : 30 min   Dixie Dials  MD  05/25/2020, 11:31 AM

## 2020-05-25 NOTE — Plan of Care (Signed)
  Problem: Activity: Goal: Risk for activity intolerance will decrease Outcome: Progressing   Problem: Nutrition: Goal: Adequate nutrition will be maintained Outcome: Progressing   Problem: Coping: Goal: Level of anxiety will decrease Outcome: Progressing   

## 2020-05-26 LAB — GLUCOSE, CAPILLARY
Glucose-Capillary: 102 mg/dL — ABNORMAL HIGH (ref 70–99)
Glucose-Capillary: 108 mg/dL — ABNORMAL HIGH (ref 70–99)
Glucose-Capillary: 128 mg/dL — ABNORMAL HIGH (ref 70–99)
Glucose-Capillary: 158 mg/dL — ABNORMAL HIGH (ref 70–99)

## 2020-05-26 NOTE — Progress Notes (Signed)
Patient states she had cane when she came in via EMS, not sure if she had in ED.  ED and Security called, no cane located.  SW to check into getting new 4 prong cane.  Will try to contact EMS that transported patient to see if cane found in ambulance as well.  Patient updated on cane situation.

## 2020-05-26 NOTE — TOC Initial Note (Addendum)
Transition of Care [facility]) - Initial/Assessment Note    Patient Details  Name: Renee Griffin MRN: [id number] Date of Birth: 05-20-1956  Transition of Care [facility]) CM/SW Contact:    Carles Collet, RN Phone Number: 05/26/2020, 11:30 AM  Clinical Narrative:  Damaris Schooner w patient at bedside. Discussed readmission, and how [facility] services can help to prevent them in the future. She declined [facility] services previously but is agreeable to them today. Patient agreeable to any agency. [facility] able to accept referral. They can open the case w PT OT and have PT eval for need for RN and have RN start within the week. Patient will Need HH orders for PT OT. Patient has DME RW and cane at home     15:50 Notified by nurse that 4 prong cane cannot be found. One has been reordered and CM requested [employer] to deliver to room today                Expected Discharge Plan: [place]     Patient Goals and CMS Choice Patient states their goals for this hospitalization and ongoing recovery are:: to go home CMS Medicare.gov Compare Post Acute Care list provided to:: Patient Choice offered to / list presented to : Patient  Expected Discharge Plan and Services Expected Discharge Plan: [place]   Discharge Planning Services: CM Consult                               HH Arranged: OT, PT [facility] Agency: [place] Date [facility] Agency Contacted: 05/26/20 Time HH Agency Contacted: 26 Representative spoke with at [place]: Malachy Mood  Prior Living Arrangements/Services   Lives with:: Self   Do you feel safe going back to the place where you live?: Yes               Activities of Daily Living      Permission Sought/Granted                  Emotional Assessment              Admission diagnosis:  Atrial fibrillation with rapid ventricular response ([place]) [I48.91] Atrial fibrillation with RVR ([place]) [I48.91] Atrial fibrillation, rapid ([place])  [I48.91] Chronic obstructive pulmonary disease, unspecified COPD type ([place]) [J44.9] Acute on chronic congestive heart failure, unspecified heart failure type ([place]) [I50.9] Patient Active Problem List   Diagnosis Date Noted  . Atrial fibrillation with RVR ([place]) 05/23/2020  . Acute congestive heart failure ([place]) 05/17/2020  . NSTEMI (non-ST elevated myocardial infarction) ([place]) 02/07/2020  . Acute systolic heart failure ([place]) 01/19/2020  . Acute lower GI bleeding 12/16/2018  . Dehydration 04/13/2018  . Ovarian mass, left   . Abdominal pain 02/01/2018  . Essential hypertension 03/04/2016  . HLD (hyperlipidemia) 03/04/2016  . Type 2 diabetes mellitus with circulatory disorder ([place]) 03/04/2016  . Obesity 03/04/2016  . CVA (cerebral vascular accident) ([place]) 03/05/2015  . Bilateral low back pain without sciatica 03/05/2015  . Essential hypertension, benign 03/05/2015  . Tobacco use disorder 03/05/2015  . Hyperlipidemia 03/05/2015  . Hyperparathyroidism, primary ([place]) 02/13/2013  . Generalized ischemic cerebrovascular disease 02/06/2013  . Hyperreflexia 02/06/2013  . Abnormality of gait 02/06/2013  . Disturbance of skin sensation 02/06/2013  . CVA (cerebral infarction) 06/03/2012  . HTN (hypertension) 06/03/2012  . Dyslipidemia 06/03/2012  . Anxiety 06/03/2012   PCP:  Dixie Dials, MD Pharmacy:  [id number] - [place], [place] [address] AT [address] [place] [address] Phone: [phone] Fax: [phone]  [place] [address] [place] [address] Phone: [phone] Fax: [phone]     Social Determinants of Health (SDOH) Interventions    Readmission Risk Interventions Readmission Risk Prevention Plan 05/21/2020 02/13/2020  Transportation Screening Complete Complete  PCP or Specialist Appt within 3-5 Days Complete Complete  HRI or  [place] Complete Complete  Social Work Consult for [place] Planning/Counseling Complete Complete  Palliative Care Screening Not Applicable Not Applicable  Medication Review Press photographer) Complete Complete  Some recent data might be hidden

## 2020-05-26 NOTE — Evaluation (Signed)
Occupational Therapy Evaluation + Discharge Patient Details Name: Renee Griffin: [id number] DOB: October 14, 1956 Today's Date: 05/26/2020    History of Present Illness Pt is a 64 y/o female admitted secondary to a fib with RVR. PMH includes CVA, DM, CHF, CAD s/p CABG, HTN, s/p AVR, and COPD.    Clinical Impression   Pt PTA: Pt living alone and reports independence with ADL and IADL; Pt using quad cane for mobility. Pt currently modified independence for ADL and mobility per pt. Pt side lying in bed  and performing minimal bed mobility, but refusing to sit EOB or walk to sink stating "I just did all that."  Pt reports that she transfers to Renee Griffin independently and washed up at sink earlier. Pt  Provided thorough education for energy conservation. Pt's HR increased from 88 BPM to 104 BPM with exertion in bed. No dizziness reported. No further OT skilled services required. OT signing off.     Follow Up Recommendations  No OT follow up;Supervision - Intermittent    Equipment Recommendations  None recommended by OT    Recommendations for Other Services       Precautions / Restrictions Precautions Precautions: Fall Restrictions Weight Bearing Restrictions: No      Mobility Bed Mobility Overal bed mobility: Modified Independent             General bed mobility comments: Increased time, pulling self up in bed.  Transfers                      Balance Overall balance assessment: Needs assistance Sitting-balance support: No upper extremity supported;Feet supported Sitting balance-Leahy Scale: Normal                                     ADL either performed or assessed with clinical judgement   ADL Overall ADL's : At baseline                                       General ADL Comments: Pt side lying and performing minimal bed mobility, but refusing to sit EOB or walk to sink stating "I just did all that."  Pt reports that she transfers to  Renee Griffin independently and washed up at sink earlier. Pt education for energy conservation provided.     Vision Baseline Vision/History: Wears glasses Wears Glasses: Reading only Patient Visual Report: No change from baseline Vision Assessment?: No apparent visual deficits Additional Comments: Pt reports that she will see her eye doctor after she leaves here.     Perception     Praxis      Pertinent Vitals/Pain Pain Assessment: No/denies pain     Hand Dominance Left   Extremity/Trunk Assessment Upper Extremity Assessment Upper Extremity Assessment: Overall WFL for tasks assessed   Lower Extremity Assessment Lower Extremity Assessment: Generalized weakness;Defer to PT evaluation   Cervical / Trunk Assessment Cervical / Trunk Assessment: Kyphotic   Communication Communication Communication: No difficulties   Cognition Arousal/Alertness: Awake/alert Behavior During Therapy: WFL for tasks assessed/performed Overall Cognitive Status: Within Functional Limits for tasks assessed                                 General Comments: Pt appears to follow all commands  General Comments  Pt's HR increased from 88 BPM to 104 BPm with exertion in bed. No dizziness reported.    Exercises     Shoulder Instructions      Home Living Family/patient expects to be discharged to:: Private residence Living Arrangements: Alone Available Help at Discharge: Other (Comment)(brother lives 45 mins away, no other family ) Type of Home: Apartment Home Access: Stairs to enter Technical brewer of Steps: 1 Entrance Stairs-Rails: None Home Layout: One level     Bathroom Shower/Tub: Tub/shower unit         Home Equipment: Mining engineer - 2 wheels;Grab bars - tub/shower   Additional Comments: Reports quad cane was lost in transition to hospital.       Prior Functioning/Environment Level of Independence: Independent with assistive device(s)        Comments:  Reports using quad cane at baseline;no assist for ADL/IADL. Pt drives/manages meds and grocery shops        OT Problem List: Decreased activity tolerance      OT Treatment/Interventions:      OT Goals(Current goals can be found in the care plan section) Acute Rehab OT Goals Patient Stated Goal: return home  OT Frequency:     Barriers to D/C:            Co-evaluation              AM-PAC OT "6 Clicks" Daily Activity     Outcome Measure Help from another person eating meals?: None Help from another person taking care of personal grooming?: None Help from another person toileting, which includes using toliet, bedpan, or urinal?: None Help from another person bathing (including washing, rinsing, drying)?: None Help from another person to put on and taking off regular upper body clothing?: None Help from another person to put on and taking off regular lower body clothing?: None 6 Click Score: 24   End of Session Nurse Communication: Mobility status  Activity Tolerance: Patient tolerated treatment well;Patient limited by lethargy Patient left: in bed;with call bell/phone within reach  OT Visit Diagnosis: Muscle weakness (generalized) (M62.81)                Time: 7366-8159 OT Time Calculation (min): 19 min Charges:  OT General Charges $OT Visit: 1 Visit OT Evaluation $OT Eval Moderate Complexity: 1 Mod  Renee Griffin, OTR/L Acute Rehabilitation Services Pager: [phone] Office: [phone]   Renee Griffin C 05/26/2020, 2:46 PM

## 2020-05-26 NOTE — Progress Notes (Signed)
Ref: Dixie Dials, MD   Subjective:  Feeling weak. She has not ambulated as she lost her quad cane during tranport to ER or to floor. VS stable. HR in 90's.  Objective:  Vital Signs in the last 24 hours: Temp:  [97.4 F (36.3 C)-98.3 F (36.8 C)] 97.6 F (36.4 C) (06/01 1032) Pulse Rate:  [68-97] 91 (06/01 1100) Cardiac Rhythm: Atrial fibrillation (06/01 1005) Resp:  [17-20] 18 (06/01 1032) BP: (100-125)/(62-99) 110/99 (06/01 1032) SpO2:  [94 %-100 %] 96 % (06/01 1100) Weight:  [73 kg] 73 kg (06/01 0529)  Physical Exam: BP Readings from Last 1 Encounters:  05/26/20 (!) 110/99     Wt Readings from Last 1 Encounters:  05/26/20 73 kg    Weight change: 0.629 kg Body mass index is 28.52 kg/m. HEENT: [place]/AT, Eyes-Brown, wears glasses, Conjunctiva-Pale pink, Sclera-Non-icteric Neck: No JVD, No bruit, Trachea midline. Lungs:  Clear, Bilateral. Cardiac: Irregular rhythm, normal S1 and S2, no S3. II/VI systolic murmur. Abdomen:  Soft, non-tender. BS present. Extremities:  No edema present. No cyanosis. No clubbing. CNS: AxOx3, Cranial nerves grossly intact, moves all 4 extremities.  Skin: Warm and dry.   Intake/Output from previous day: 05/31 0701 - 06/01 0700 In: 240 [P.O.:240] Out: 1600 [Urine:1600]    Lab Results: BMET    Component Value Date/Time   NA 141 05/24/2020 0524   NA 143 05/23/2020 1901   NA 145 05/23/2020 1750   K 4.0 05/24/2020 0524   K 3.6 05/23/2020 1901   K 3.9 05/23/2020 1750   CL 110 05/24/2020 0524   CL 111 05/23/2020 1750   CL 106 05/21/2020 0440   CO2 17 (L) 05/24/2020 0524   CO2 20 (L) 05/23/2020 1750   CO2 21 (L) 05/21/2020 0440   GLUCOSE 165 (H) 05/24/2020 0524   GLUCOSE 144 (H) 05/23/2020 1750   GLUCOSE 111 (H) 05/21/2020 0440   BUN 37 (H) 05/24/2020 0524   BUN 37 (H) 05/23/2020 1750   BUN 47 (H) 05/21/2020 0440   CREATININE 1.93 (H) 05/24/2020 0524   CREATININE 1.87 (H) 05/23/2020 1750   CREATININE 2.23 (H) 05/21/2020 0440   CALCIUM 8.9 05/24/2020 0524   CALCIUM 9.4 05/23/2020 1750   CALCIUM 9.5 05/21/2020 0440   GFRNONAA 27 (L) 05/24/2020 0524   GFRNONAA 28 (L) 05/23/2020 1750   GFRNONAA 23 (L) 05/21/2020 0440   GFRAA 31 (L) 05/24/2020 0524   GFRAA 32 (L) 05/23/2020 1750   GFRAA 26 (L) 05/21/2020 0440   CBC    Component Value Date/Time   WBC 5.4 05/24/2020 0524   RBC 3.42 (L) 05/24/2020 0524   HGB 10.4 (L) 05/24/2020 0524   HCT 33.9 (L) 05/24/2020 0524   PLT 240 05/24/2020 0524   MCV 99.1 05/24/2020 0524   MCH 30.4 05/24/2020 0524   MCHC 30.7 05/24/2020 0524   RDW 18.6 (H) 05/24/2020 0524   LYMPHSABS 1.2 05/17/2020 0100   MONOABS 0.6 05/17/2020 0100   EOSABS 0.2 05/17/2020 0100   BASOSABS 0.1 05/17/2020 0100   HEPATIC Function Panel Recent Labs    02/07/20 1542 02/13/20 0346 05/17/20 0100  PROT 7.0 6.1* 6.1*   HEMOGLOBIN A1C No components found for: HGA1C,  MPG CARDIAC ENZYMES Lab Results  Component Value Date   CKTOTAL 735 (H) 02/01/2018   BNP No results for input(s): PROBNP in the last 8760 hours. TSH Recent Labs    01/19/20 1250 05/17/20 1734  TSH 2.243 1.710   CHOLESTEROL Recent Labs    02/08/20 0257  CHOL 202*    Scheduled Meds:  allopurinol  100 mg Oral Daily   ALPRAZolam  0.5 mg Oral Q8H   apixaban  2.5 mg Oral BID   aspirin EC  81 mg Oral Daily   cholecalciferol  1,000 Units Oral Daily   digoxin  0.125 mg Oral Daily   diltiazem  120 mg Oral Daily   ferrous sulfate  325 mg Oral Q breakfast   furosemide  40 mg Intravenous Daily   gemfibrozil  600 mg Oral BID   insulin aspart  0-15 Units Subcutaneous TID WC   metoprolol tartrate  12.5 mg Oral BID   multivitamin with minerals  1 tablet Oral Daily   oxymetazoline  1 spray Each Nare BID   pantoprazole  40 mg Oral BID   potassium chloride  10 mEq Oral BID   prazosin  1 mg Oral BID   sertraline  100 mg Oral QHS   sodium chloride flush  3 mL Intravenous Q12H   valACYclovir  500 mg Oral  Daily   Continuous Infusions:  sodium chloride     PRN Meds:.sodium chloride, acetaminophen, albuterol, alum & mag hydroxide-simeth, loperamide, nitroGLYCERIN, ondansetron, promethazine, sodium chloride flush, tiZANidine  Assessment/Plan:   Atrial fibrillation with controlled ventricular response. 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 She apparently lost it btween ambulance and ER CMET in AM. Also discussed SNF placement till weakness is gone.   LOS: 3 days   Time spent including chart review, lab review, examination, discussion with patient and nurse : 30 min   Dixie Dials  MD  05/26/2020, 2:12 PM

## 2020-05-26 NOTE — Evaluation (Addendum)
Physical Therapy Evaluation and Discharge (at pt's request) Patient Details Name: Renee Griffin MRN: [id number] DOB: 1956/05/02 Today's Date: 05/26/2020   History of Present Illness  Pt is a 64 y/o female admitted secondary to a fib with RVR. PMH includes CVA, DM, CHF, CAD s/p CABG, HTN, s/p AVR, and COPD.   Clinical Impression  Pt admitted secondary to problem above with deficits below. Pt only agreeable to sitting at EOB to eat lunch this session. Per notes, pt was agreeable to HHPT/OT, however, is now reporting she does not want to have any home health services. Also requesting to discontinue acute PT orders, as she states she does not need them. Feel pt would benefit from PT services, however, will sign off at pt's request. Pt did request new quad cane as hers was lost in transition to hospital. If pt agrees to participate, please re-consult.     Follow Up Recommendations Other (comment)(Pt refusing any HHPT services )    Equipment Recommendations  Other (comment)(quad cane)    Recommendations for Other Services       Precautions / Restrictions Precautions Precautions: Fall Restrictions Weight Bearing Restrictions: No      Mobility  Bed Mobility Overal bed mobility: Modified Independent             General bed mobility comments: Increased time, but no physical assist required. Refusing further mobility and just wanted to sit EOB to eat lunch.   Transfers                    Ambulation/Gait                Stairs            Wheelchair Mobility    Modified Rankin (Stroke Patients Only)       Balance Overall balance assessment: Needs assistance Sitting-balance support: No upper extremity supported;Feet supported Sitting balance-Leahy Scale: Normal                                       Pertinent Vitals/Pain Pain Assessment: No/denies pain    Home Living Family/patient expects to be discharged to:: Private  residence Living Arrangements: Alone Available Help at Discharge: Other (Comment)(brother lives 45 mins away, no other family ) Type of Home: Apartment Home Access: Stairs to enter Entrance Stairs-Rails: None Entrance Stairs-Number of Steps: 1 Home Layout: One level Home Equipment: Cane - quad;Walker - 2 wheels;Grab bars - tub/shower Additional Comments: Reports quad cane was lost in transition to hospital.     Prior Function Level of Independence: Independent with assistive device(s)         Comments: Reports using quad cane at baseline     Hand Dominance        Extremity/Trunk Assessment   Upper Extremity Assessment Upper Extremity Assessment: Defer to OT evaluation    Lower Extremity Assessment Lower Extremity Assessment: Generalized weakness    Cervical / Trunk Assessment Cervical / Trunk Assessment: Kyphotic  Communication   Communication: No difficulties  Cognition Arousal/Alertness: Awake/alert Behavior During Therapy: Agitated;WFL for tasks assessed/performed Overall Cognitive Status: No family/caregiver present to determine baseline cognitive functioning                                 General Comments: Pt somewhat agitated initially with PT presence. Reports she did not  need PT and that she did not know why they keep sending [place] to see her. Pt was pleasant at end of session.       General Comments      Exercises     Assessment/Plan    PT Assessment Patent does not need any further PT services  PT Problem List         PT Treatment Interventions      PT Goals (Current goals can be found in the Care Plan section)  Acute Rehab PT Goals Patient Stated Goal: return home PT Goal Formulation: With patient Time For Goal Achievement: 05/26/20 Potential to Achieve Goals: Fair    Frequency     Barriers to discharge   Pt lives alone with no one to assist     Co-evaluation               AM-PAC PT "6 Clicks" Mobility  Outcome  Measure Help needed turning from your back to your side while in a flat bed without using bedrails?: None Help needed moving from lying on your back to sitting on the side of a flat bed without using bedrails?: None Help needed moving to and from a bed to a chair (including a wheelchair)?: A Little Help needed standing up from a chair using your arms (e.g., wheelchair or bedside chair)?: A Little Help needed to walk in hospital room?: A Little Help needed climbing 3-5 steps with a railing? : A Little 6 Click Score: 20    End of Session Equipment Utilized During Treatment: Gait belt Activity Tolerance: Patient tolerated treatment well Patient left: in bed;with call bell/phone within reach(sitting EOB ) Nurse Communication: Mobility status;Other (comment)(pt requesting to discontinue PT orders) PT Visit Diagnosis: Difficulty in walking, not elsewhere classified (R26.2)    Time: 2761-4709 PT Time Calculation (min) (ACUTE ONLY): 11 min   Charges:   PT Evaluation $PT Eval Moderate Complexity: 1 Mod          Reuel Derby, PT, DPT  Acute Rehabilitation Services  Pager: [phone] Office: [phone]   Rudean Hitt 05/26/2020, 1:30 PM

## 2020-05-27 LAB — CBC WITH DIFFERENTIAL/PLATELET
Abs Immature Granulocytes: 0.03 10*3/uL (ref 0.00–0.07)
Basophils Absolute: 0.1 10*3/uL (ref 0.0–0.1)
Basophils Relative: 1 %
Eosinophils Absolute: 0.2 10*3/uL (ref 0.0–0.5)
Eosinophils Relative: 3 %
HCT: 37.3 % (ref 36.0–46.0)
Hemoglobin: 11.4 g/dL — ABNORMAL LOW (ref 12.0–15.0)
Immature Granulocytes: 0 %
Lymphocytes Relative: 14 %
Lymphs Abs: 1 10*3/uL (ref 0.7–4.0)
MCH: 30.2 pg (ref 26.0–34.0)
MCHC: 30.6 g/dL (ref 30.0–36.0)
MCV: 98.9 fL (ref 80.0–100.0)
Monocytes Absolute: 0.6 10*3/uL (ref 0.1–1.0)
Monocytes Relative: 8 %
Neutro Abs: 5.5 10*3/uL (ref 1.7–7.7)
Neutrophils Relative %: 74 %
Platelets: 275 10*3/uL (ref 150–400)
RBC: 3.77 MIL/uL — ABNORMAL LOW (ref 3.87–5.11)
RDW: 17.8 % — ABNORMAL HIGH (ref 11.5–15.5)
WBC: 7.4 10*3/uL (ref 4.0–10.5)
nRBC: 0 % (ref 0.0–0.2)

## 2020-05-27 LAB — COMPREHENSIVE METABOLIC PANEL
ALT: 13 U/L (ref 0–44)
AST: 15 U/L (ref 15–41)
Albumin: 2.9 g/dL — ABNORMAL LOW (ref 3.5–5.0)
Alkaline Phosphatase: 88 U/L (ref 38–126)
Anion gap: 11 (ref 5–15)
BUN: 60 mg/dL — ABNORMAL HIGH (ref 8–23)
CO2: 22 mmol/L (ref 22–32)
Calcium: 9.1 mg/dL (ref 8.9–10.3)
Chloride: 106 mmol/L (ref 98–111)
Creatinine, Ser: 2.41 mg/dL — ABNORMAL HIGH (ref 0.44–1.00)
GFR calc Af Amer: 24 mL/min — ABNORMAL LOW (ref >60)
GFR calc non Af Amer: 21 mL/min — ABNORMAL LOW (ref >60)
Glucose, Bld: 103 mg/dL — ABNORMAL HIGH (ref 70–99)
Potassium: 4.4 mmol/L (ref 3.5–5.1)
Sodium: 139 mmol/L (ref 135–145)
Total Bilirubin: 0.6 mg/dL (ref 0.3–1.2)
Total Protein: 5.3 g/dL — ABNORMAL LOW (ref 6.5–8.1)

## 2020-05-27 LAB — GLUCOSE, CAPILLARY
Glucose-Capillary: 99 mg/dL (ref 70–99)
Glucose-Capillary: 113 mg/dL — ABNORMAL HIGH (ref 70–99)

## 2020-05-27 MED ORDER — FUROSEMIDE 40 MG PO TABS
40.0000 mg | ORAL_TABLET | Freq: Every day | ORAL | Status: DC
  Administered 2020-05-27: 40 mg via ORAL
  Filled 2020-05-27: qty 1

## 2020-05-27 MED ORDER — DILTIAZEM HCL ER COATED BEADS 120 MG PO CP24: 120.0000 mg | ORAL_CAPSULE | Freq: Every day | ORAL | 2 refills | Status: DC

## 2020-05-27 MED ORDER — DIGOXIN 125 MCG PO TABS: 0.1250 mg | ORAL_TABLET | ORAL | Status: DC

## 2020-05-27 MED ORDER — DIGOXIN 125 MCG PO TABS: 0.1250 mg | ORAL_TABLET | ORAL | 1 refills | Status: DC

## 2020-05-27 NOTE — Discharge Summary (Signed)
Physician Discharge Summary  Patient ID: Renee Griffin MRN: [id number] DOB/AGE: 1956/03/13 64 y.o.  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 S/P bioprosthetic AV Severe MR Moderate TR Mild pulmonary systolic HTN Hyperlipidemia S/P stroke, left cerebellar and Pons area Obesity Panic attack Chronic right shoulder pain  Discharge Diagnoses:  Principle Problem: * Atrial fibrillation with RVR (HCC) * CHA2DS2VASc score of 5 Active problems:  Acute on chronic systolic and diastolic left heart failure CKD, IV COPD with hypoxemia HTN S/P CABG S/P bioprosthetic AV 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 She did not pick up her torsemide prescription and had salt food intake. IN ER she had atrial fibrillation with RVR. She has PMH of HTN, Hyperlipidemia, CAD, CABG, S/P AV replacement, COPD, tobaco use disorder, CKD, IV and anxiety. She responded well to IV diltiazem followed by increasing dose of oral diltiazem, digoxin and metoprolol. She had good diuresis with IV furosemide use. She ambulated well and was discharged home in stable condition.  Consults: cardiology  Significant Diagnostic Studies: labs: Hgb 11.5 gm. BNP 3,252.0 pg. Troponin I 140 to 194 ng. Creatinine 1.87 to 2.41 mg. Blood glucose near normal.  EKG: Atrial fibrillation with RVR, ASWMI and non-specific T wave  changes.  CXR: Cardiomegaly and pulmonary venous congestion.  Treatments: cardiac meds: metoprolol, diltiazem, digoxin, torsemide, potassium and Eliquis.  Discharge Exam: Blood pressure (!) 129/94, pulse 87, temperature (!) 97.4 F (36.3 C), temperature source Oral, resp. rate 20, height '5\' 3"'  (1.6 m), weight 72.8 kg, SpO2 95 %. General appearance: alert, cooperative and appears stated age. Head: Normocephalic, atraumatic. Eyes: Blue eyes, pink conjunctiva, corneas clear. PERRL, EOM's intact.  Neck: No adenopathy, no carotid bruit, no JVD, supple, symmetrical, trachea midline and thyroid not enlarged. Resp: Clear to auscultation bilaterally. Cardio: Regular rate and rhythm, S1, S2 normal, II/VI systolic murmur, no click, rub or gallop. GI: Soft, non-tender; bowel sounds normal; no organomegaly. Extremities: No edema, cyanosis or clubbing. Skin: Warm and dry.  Neurologic: Alert and oriented X 3, normal strength and tone. Normal coordination and slow gait with quad cane use.  Disposition: Discharge disposition: 01-Home or Self Care        Allergies as of 05/27/2020      Reactions   Amoxicillin-pot Clavulanate Diarrhea   Has patient had a PCN reaction causing immediate rash, facial/tongue/throat swelling, SOB or lightheadedness with hypotension: Unknown Has patient had a PCN reaction causing severe rash involving mucus membranes or skin necrosis:UnknoWN Has patient had a PCN reaction that required hospitalization: Unknown Has patient had a PCN reaction occurring within the last 10 years: Unknown If all of the above answers are "NO", then may proceed with Cephalosporin use.   Bupropion Nausea Only   Nicotine Nausea Only, Rash   Reaction to patches      Medication List    STOP taking these medications   diltiazem 60 MG tablet Commonly known as: CARDIZEM     TAKE these medications   Accu-Chek  Aviva Plus test strip Generic drug: glucose blood See admin instructions.    Accu-Chek Aviva Plus w/Device Kit See admin instructions.   Accu-Chek Softclix Lancets lancets See admin instructions.   acetaminophen 325 MG tablet Commonly known as: TYLENOL Take 2 tablets (650 mg total) by mouth every 4 (four) hours as needed for headache or mild pain.   albuterol 108 (90 Base) MCG/ACT inhaler Commonly known as: VENTOLIN HFA Inhale 2 puffs into the lungs every 6 (six) hours as needed for wheezing or shortness of breath.   allopurinol 100 MG tablet Commonly known as: ZYLOPRIM Take 1 tablet (100 mg total) by mouth daily.   ALPRAZolam 0.5 MG tablet Commonly known as: XANAX Take 1 tablet (0.5 mg total) by mouth 2 (two) times daily as needed for anxiety. What changed: when to take this   apixaban 2.5 MG Tabs tablet Commonly known as: ELIQUIS Take 1 tablet (2.5 mg total) by mouth 2 (two) times daily.   aspirin EC 81 MG tablet Take 1 tablet (81 mg total) by mouth daily. What changed: Another medication with the same name was removed. Continue taking this medication, and follow the directions you see here.   cholecalciferol 25 MCG (1000 UNIT) tablet Commonly known as: VITAMIN D3 Take 1,000 Units by mouth daily.   digoxin 0.125 MG tablet Commonly known as: LANOXIN Take 1 tablet (0.125 mg total) by mouth every Monday, Wednesday, and Friday. Start taking on: May 29, 2020   diltiazem 120 MG 24 hr capsule Commonly known as: CARDIZEM CD Take 1 capsule (120 mg total) by mouth daily. Start taking on: May 28, 2020   ferrous sulfate 325 (65 FE) MG tablet Take 1 tablet (325 mg total) by mouth daily with breakfast.   gemfibrozil 600 MG tablet Commonly known as: LOPID Take 600 mg by mouth 2 (two) times daily.   loperamide 2 MG capsule Commonly known as: IMODIUM Take 1 capsule (2 mg total) by mouth 4 (four) times daily as needed for diarrhea or loose stools. What changed: when to take this   metoprolol tartrate 25 MG tablet Commonly known as: LOPRESSOR Take  0.5 tablets (12.5 mg total) by mouth 2 (two) times daily.   multivitamin with minerals Tabs tablet Take 1 tablet by mouth daily.   nitroGLYCERIN 0.4 MG SL tablet Commonly known as: NITROSTAT Place 1 tablet (0.4 mg total) under the tongue every 5 (five) minutes x 3 doses as needed for chest pain.   ondansetron 4 MG tablet Commonly known as: ZOFRAN Take 1 tablet (4 mg total) by mouth every 6 (six) hours as needed for nausea or vomiting.   oxymetazoline 0.05 % nasal spray Commonly known as: AFRIN Place 1 spray into both nostrils 2 (two) times daily.   pantoprazole 40 MG tablet Commonly known as: PROTONIX Take 1 tablet (40 mg total) by mouth 2 (two) times daily. What changed:   when to take this  reasons to take this   potassium chloride 10 MEQ tablet Commonly known as: KLOR-CON Take 10 mEq by mouth daily.   prazosin 1 MG capsule Commonly known as: MINIPRESS Take 1 capsule (1 mg total) by mouth 2 (two) times daily.   promethazine 25 MG tablet Commonly known as: PHENERGAN Take 1 tablet (25 mg total) by mouth every 6 (six) hours as needed for nausea or vomiting.   sertraline 100 MG tablet Commonly known as: ZOLOFT Take 100 mg by mouth at bedtime.   tiZANidine 2 MG tablet Commonly known as: ZANAFLEX Take  1 tablet (2 mg total) by mouth at bedtime as needed for muscle spasms.   torsemide 20 MG tablet Commonly known as: DEMADEX Take 1 tablet (20 mg total) by mouth daily.   valACYclovir 500 MG tablet Commonly known as: VALTREX Take 500 mg by mouth daily.            Durable Medical Equipment  (From admission, onward)         Start     Ordered   05/26/20 1549  For home use only DME Cane  Once    Comments: 4 prong   05/26/20 1548         Follow-up Information    [facility] [place] [facility] Follow up.   Why: For home health services. They will contact you within a day or two to set up your first home visit Contact information: [place] [address] [phone]        Dixie Dials, MD. Schedule an appointment as soon as possible for a visit in 1 week(s).   Specialty: Cardiology Contact information: [place] [address] [phone]           Time spent: Review of old chart, current chart, lab, x-ray, cardiac tests and discussion with patient over 60 minutes.  Signed: [name] 05/27/2020, 6:20 PM

## 2020-05-27 NOTE — Progress Notes (Signed)
Patient ambulated in the hallway to the nurse's station and back to her room. She tolerated the activity well; no dyspnea, heart rate remained 80's to 90's, and SpO2 97% on room air.

## 2020-06-22 ENCOUNTER — Inpatient Hospital Stay (HOSPITAL_COMMUNITY)
Admission: EM | Admit: 2020-06-22 | Discharge: 2020-06-24 | DRG: 191 | Disposition: A | Discharge status: Home / Self Care | Payer: Medicare Other | Attending: Cardiovascular Disease | Admitting: Cardiovascular Disease

## 2020-06-22 ENCOUNTER — Emergency Department (HOSPITAL_COMMUNITY): Payer: Medicare Other

## 2020-06-22 ENCOUNTER — Other Ambulatory Visit: Payer: Self-pay

## 2020-06-22 ENCOUNTER — Encounter (HOSPITAL_COMMUNITY): Payer: Self-pay | Admitting: Emergency Medicine

## 2020-06-22 DIAGNOSIS — R0602 Shortness of breath: Secondary | ICD-10-CM

## 2020-06-22 DIAGNOSIS — Z7902 Long term (current) use of antithrombotics/antiplatelets: Secondary | ICD-10-CM

## 2020-06-22 DIAGNOSIS — Z8616 Personal history of COVID-19: Secondary | ICD-10-CM

## 2020-06-22 DIAGNOSIS — Z8673 Personal history of transient ischemic attack (TIA), and cerebral infarction without residual deficits: Secondary | ICD-10-CM

## 2020-06-22 DIAGNOSIS — Z951 Presence of aortocoronary bypass graft: Secondary | ICD-10-CM

## 2020-06-22 DIAGNOSIS — Z7982 Long term (current) use of aspirin: Secondary | ICD-10-CM

## 2020-06-22 DIAGNOSIS — I252 Old myocardial infarction: Secondary | ICD-10-CM

## 2020-06-22 DIAGNOSIS — N184 Chronic kidney disease, stage 4 (severe): Secondary | ICD-10-CM | POA: Diagnosis present

## 2020-06-22 DIAGNOSIS — F17210 Nicotine dependence, cigarettes, uncomplicated: Secondary | ICD-10-CM | POA: Diagnosis present

## 2020-06-22 DIAGNOSIS — Z888 Allergy status to other drugs, medicaments and biological substances status: Secondary | ICD-10-CM

## 2020-06-22 DIAGNOSIS — I272 Pulmonary hypertension, unspecified: Secondary | ICD-10-CM | POA: Diagnosis present

## 2020-06-22 DIAGNOSIS — Z880 Allergy status to penicillin: Secondary | ICD-10-CM

## 2020-06-22 DIAGNOSIS — J441 Chronic obstructive pulmonary disease with (acute) exacerbation: Secondary | ICD-10-CM | POA: Diagnosis present

## 2020-06-22 DIAGNOSIS — I5030 Unspecified diastolic (congestive) heart failure: Secondary | ICD-10-CM | POA: Diagnosis present

## 2020-06-22 DIAGNOSIS — I130 Hypertensive heart and chronic kidney disease with heart failure and stage 1 through stage 4 chronic kidney disease, or unspecified chronic kidney disease: Secondary | ICD-10-CM | POA: Diagnosis present

## 2020-06-22 DIAGNOSIS — F329 Major depressive disorder, single episode, unspecified: Secondary | ICD-10-CM | POA: Diagnosis present

## 2020-06-22 DIAGNOSIS — E7800 Pure hypercholesterolemia, unspecified: Secondary | ICD-10-CM | POA: Diagnosis present

## 2020-06-22 DIAGNOSIS — E1122 Type 2 diabetes mellitus with diabetic chronic kidney disease: Secondary | ICD-10-CM | POA: Diagnosis present

## 2020-06-22 DIAGNOSIS — E876 Hypokalemia: Secondary | ICD-10-CM | POA: Diagnosis present

## 2020-06-22 DIAGNOSIS — I482 Chronic atrial fibrillation, unspecified: Secondary | ICD-10-CM | POA: Diagnosis present

## 2020-06-22 DIAGNOSIS — J45901 Unspecified asthma with (acute) exacerbation: Secondary | ICD-10-CM | POA: Diagnosis present

## 2020-06-22 DIAGNOSIS — Z79899 Other long term (current) drug therapy: Secondary | ICD-10-CM

## 2020-06-22 LAB — CBC
HCT: 40 % (ref 36.0–46.0)
Hemoglobin: 12 g/dL (ref 12.0–15.0)
MCH: 29.9 pg (ref 26.0–34.0)
MCHC: 30 g/dL (ref 30.0–36.0)
MCV: 99.5 fL (ref 80.0–100.0)
Platelets: 290 10*3/uL (ref 150–400)
RBC: 4.02 MIL/uL (ref 3.87–5.11)
RDW: 19.5 % — ABNORMAL HIGH (ref 11.5–15.5)
WBC: 9.3 10*3/uL (ref 4.0–10.5)
nRBC: 0 % (ref 0.0–0.2)

## 2020-06-22 LAB — BASIC METABOLIC PANEL
Anion gap: 13 (ref 5–15)
BUN: 22 mg/dL (ref 8–23)
CO2: 21 mmol/L — ABNORMAL LOW (ref 22–32)
Calcium: 9.9 mg/dL (ref 8.9–10.3)
Chloride: 109 mmol/L (ref 98–111)
Creatinine, Ser: 1.63 mg/dL — ABNORMAL HIGH (ref 0.44–1.00)
GFR calc Af Amer: 38 mL/min — ABNORMAL LOW (ref >60)
GFR calc non Af Amer: 33 mL/min — ABNORMAL LOW (ref >60)
Glucose, Bld: 115 mg/dL — ABNORMAL HIGH (ref 70–99)
Potassium: 2.9 mmol/L — ABNORMAL LOW (ref 3.5–5.1)
Sodium: 143 mmol/L (ref 135–145)

## 2020-06-22 LAB — TROPONIN I (HIGH SENSITIVITY): Troponin I (High Sensitivity): 67 ng/L — ABNORMAL HIGH (ref <18)

## 2020-06-22 LAB — BRAIN NATRIURETIC PEPTIDE: B Natriuretic Peptide: 3846.3 pg/mL — ABNORMAL HIGH (ref 0.0–100.0)

## 2020-06-22 LAB — CBG MONITORING, ED: Glucose-Capillary: 155 mg/dL — ABNORMAL HIGH (ref 70–99)

## 2020-06-22 MED ORDER — PRAZOSIN HCL 1 MG PO CAPS
1.0000 mg | ORAL_CAPSULE | Freq: Once | ORAL | Status: AC
  Administered 2020-06-22: 1 mg via ORAL
  Filled 2020-06-22: qty 1

## 2020-06-22 MED ORDER — POTASSIUM CHLORIDE CRYS ER 20 MEQ PO TBCR
40.0000 meq | EXTENDED_RELEASE_TABLET | Freq: Once | ORAL | Status: AC
  Administered 2020-06-22: 40 meq via ORAL
  Filled 2020-06-22: qty 2

## 2020-06-22 MED ORDER — METOPROLOL TARTRATE 25 MG PO TABS
12.5000 mg | ORAL_TABLET | Freq: Once | ORAL | Status: AC
  Administered 2020-06-22: 12.5 mg via ORAL
  Filled 2020-06-22: qty 1

## 2020-06-22 MED ORDER — SODIUM CHLORIDE 0.9% FLUSH: 3.0000 mL | Freq: Once | INTRAVENOUS | Status: DC

## 2020-06-22 MED ORDER — ACETAMINOPHEN 325 MG PO TABS
650.0000 mg | ORAL_TABLET | Freq: Once | ORAL | Status: AC
  Administered 2020-06-22: 650 mg via ORAL
  Filled 2020-06-22: qty 2

## 2020-06-22 MED ORDER — ALPRAZOLAM 0.25 MG PO TABS
0.5000 mg | ORAL_TABLET | Freq: Once | ORAL | Status: AC
  Administered 2020-06-22: 0.5 mg via ORAL
  Filled 2020-06-22: qty 2

## 2020-06-22 NOTE — ED Triage Notes (Addendum)
Pt arrives via [facility] from home with c/o sob and generalized weakness, 98% on room air, states she has home O2 that she uses PRN. Reports symptoms have been ongoing for the past 2 months. Pt was here a few months ago for CHF exacerbation, supposed to f/u with heart doctor which she has not done yet.  A/ox4, resp e/u.

## 2020-06-22 NOTE — Discharge Instructions (Signed)
Follow-up with your primary care provider and cardiology for reevaluation of instances of shortness of breath.

## 2020-06-22 NOTE — ED Provider Notes (Signed)
[facility] EMERGENCY DEPARTMENT Provider Note   CSN: [id number] Arrival date & time: 06/22/20  1358     History Chief Complaint  Patient presents with  . Shortness of Breath  . Weakness    Renee Griffin is a 64 y.o. female.  HPI      Renee Griffin is a 64 y.o. female, with a history of DM, HTN, stroke, hypercholesterolemia, A. fib, NSTEMI, CHF, presenting to the ED with shortness of breath beginning this morning. However, she states shortness of breath improved during her time in the waiting room, which amounted to at least 8 hours.  She states she is mostly upset at this point due to her wait time. She states she has had poor oral intake over the last 24 hours due to decreased appetite. Denies fever/chills, cough, chest pain, lower extremity edema, orthopnea, abdominal pain, N/V/D, urinary symptoms, syncope, dizziness, or any other complaints.     Past Medical History:  Diagnosis Date  . Depression   . Diabetes mellitus without complication ([place])   . Heart disease, congenital   . High cholesterol   . Hypertension   . Panic attack   . Stroke [facility])     Patient Active Problem List   Diagnosis Date Noted  . Atrial fibrillation with RVR ([place]) 05/23/2020  . Acute congestive heart failure ([place]) 05/17/2020  . NSTEMI (non-ST elevated myocardial infarction) ([place]) 02/07/2020  . Acute systolic heart failure ([place]) 01/19/2020  . Acute lower GI bleeding 12/16/2018  . Dehydration 04/13/2018  . Ovarian mass, left   . Abdominal pain 02/01/2018  . Essential hypertension 03/04/2016  . HLD (hyperlipidemia) 03/04/2016  . Type 2 diabetes mellitus with circulatory disorder ([place]) 03/04/2016  . Obesity 03/04/2016  . CVA (cerebral vascular accident) ([place]) 03/05/2015  . Bilateral low back pain without sciatica 03/05/2015  . Essential hypertension, benign 03/05/2015  . Tobacco use disorder 03/05/2015  . Hyperlipidemia 03/05/2015  . Hyperparathyroidism, primary  ([place]) 02/13/2013  . Generalized ischemic cerebrovascular disease 02/06/2013  . Hyperreflexia 02/06/2013  . Abnormality of gait 02/06/2013  . Disturbance of skin sensation 02/06/2013  . CVA (cerebral infarction) 06/03/2012  . HTN (hypertension) 06/03/2012  . Dyslipidemia 06/03/2012  . Anxiety 06/03/2012    Past Surgical History:  Procedure Laterality Date  . CARDIAC SURGERY  2011   CABG  . COLONOSCOPY WITH PROPOFOL N/A 02/08/2018   Procedure: COLONOSCOPY WITH PROPOFOL;  Surgeon: Otis Brace, MD;  [facility];  Service: Gastroenterology;  Laterality: N/A;  . COLONOSCOPY WITH PROPOFOL N/A 12/16/2018   Procedure: COLONOSCOPY WITH PROPOFOL;  Surgeon: Ronald Lobo, MD;  [facility];  Service: Endoscopy;  Laterality: N/A;  . DILATION AND EVACUATION     Patient states she had an abortion 30 or 40 years ago  . ESOPHAGOGASTRODUODENOSCOPY (EGD) WITH PROPOFOL Left 02/05/2018   Procedure: ESOPHAGOGASTRODUODENOSCOPY (EGD) WITH PROPOFOL;  Surgeon: Otis Brace, MD;  [facility];  Service: Gastroenterology;  Laterality: Left;  . IR ANGIOGRAM SELECTIVE EACH ADDITIONAL VESSEL  12/18/2018  . IR ANGIOGRAM SELECTIVE EACH ADDITIONAL VESSEL  12/18/2018  . IR ANGIOGRAM SELECTIVE EACH ADDITIONAL VESSEL  12/18/2018  . IR ANGIOGRAM SELECTIVE EACH ADDITIONAL VESSEL  12/18/2018  . IR ANGIOGRAM VISCERAL SELECTIVE  12/18/2018  . IR EMBO ART  VEN HEMORR LYMPH EXTRAV  INC GUIDE ROADMAPPING  12/18/2018  . IR US GUIDE VASC ACCESS RIGHT  12/18/2018  . LEFT HEART CATH AND CORONARY ANGIOGRAPHY N/A 02/10/2020   Procedure: LEFT HEART CATH AND CORONARY ANGIOGRAPHY;  Surgeon: Dixie Dials, MD;  Location: [facility];  Service: Cardiovascular;  Laterality: N/A;  . WISDOM TOOTH EXTRACTION       OB History    Gravida  1   Para      Term      Preterm      AB  1   Living  0     SAB      TAB  1   Ectopic      Multiple      Live Births               Family History  Problem Relation Age of Onset  . Cancer Mother        uterian OR cervical  . Cancer Father        lung  . Leukemia Brother   . Cancer Sister        breast    Social History   Tobacco Use  . Smoking status: Current Every Day Smoker    Packs/day: 1.00    Years: 50.00    Pack years: 50.00    Types: Cigarettes  . Smokeless tobacco: Never Used  Vaping Use  . Vaping Use: Never used  Substance Use Topics  . Alcohol use: No    Alcohol/week: 0.0 standard drinks  . Drug use: No    Home Medications Prior to Admission medications   Medication Sig Start Date End Date Taking? Authorizing Provider  acetaminophen (TYLENOL) 325 MG tablet Take 2 tablets (650 mg total) by mouth every 4 (four) hours as needed for headache or mild pain. 01/22/20  Yes Dixie Dials, MD  albuterol (VENTOLIN HFA) 108 (90 Base) MCG/ACT inhaler Inhale 2 puffs into the lungs every 6 (six) hours as needed for wheezing or shortness of breath. 01/22/20  Yes Dixie Dials, MD  allopurinol (ZYLOPRIM) 100 MG tablet Take 1 tablet (100 mg total) by mouth daily. 01/23/20  Yes Dixie Dials, MD  ALPRAZolam Duanne Moron) 0.5 MG tablet Take 1 tablet (0.5 mg total) by mouth 2 (two) times daily as needed for anxiety. 02/24/20  Yes Virgel Manifold, MD  apixaban (ELIQUIS) 2.5 MG TABS tablet Take 1 tablet (2.5 mg total) by mouth 2 (two) times daily. 01/22/20  Yes Dixie Dials, MD  aspirin EC 81 MG tablet Take 1 tablet (81 mg total) by mouth daily. 01/22/20 01/21/21 Yes Dixie Dials, MD  cholecalciferol (VITAMIN D3) 25 MCG (1000 UT) tablet Take 1,000 Units by mouth daily.   Yes [provider]  digoxin (LANOXIN) 0.125 MG tablet Take 1 tablet (0.125 mg total) by mouth every Monday, Wednesday, and Friday. 05/29/20  Yes Dixie Dials, MD  diltiazem (CARDIZEM CD) 120 MG 24 hr capsule Take 1 capsule (120 mg total) by mouth daily. 05/28/20  Yes Dixie Dials, MD  gemfibrozil (LOPID) 600 MG tablet Take 600 mg by mouth 2 (two)  times daily. 02/19/18  Yes [provider]  loperamide (IMODIUM) 2 MG capsule Take 1 capsule (2 mg total) by mouth 4 (four) times daily as needed for diarrhea or loose stools. Patient taking differently: Take 2 mg by mouth daily as needed for diarrhea or loose stools.  02/24/19  Yes Julianne Rice, MD  metoprolol tartrate (LOPRESSOR) 25 MG tablet Take 0.5 tablets (12.5 mg total) by mouth 2 (two) times daily. 12/25/18  Yes Dixie Dials, MD  Multiple Vitamin (MULTIVITAMIN WITH MINERALS) TABS tablet Take 1 tablet by mouth daily.   Yes [provider]  nitroGLYCERIN (NITROSTAT) 0.4 MG  SL tablet Place 1 tablet (0.4 mg total) under the tongue every 5 (five) minutes x 3 doses as needed for chest pain. 02/14/20  Yes Dixie Dials, MD  oxymetazoline (AFRIN) 0.05 % nasal spray Place 1 spray into both nostrils 2 (two) times daily.   Yes [provider]  pantoprazole (PROTONIX) 40 MG tablet Take 1 tablet (40 mg total) by mouth 2 (two) times daily. Patient taking differently: Take 40 mg by mouth 2 (two) times daily as needed (heartburn).  02/09/18  Yes Dixie Dials, MD  potassium chloride (KLOR-CON) 10 MEQ tablet Take 10 mEq by mouth daily.   Yes [provider]  prazosin (MINIPRESS) 1 MG capsule Take 1 capsule (1 mg total) by mouth 2 (two) times daily. 02/09/18  Yes Dixie Dials, MD  promethazine (PHENERGAN) 25 MG tablet Take 1 tablet (25 mg total) by mouth every 6 (six) hours as needed for nausea or vomiting. 02/24/20  Yes Virgel Manifold, MD  sertraline (ZOLOFT) 100 MG tablet Take 100 mg by mouth at bedtime.    Yes [provider]  tiZANidine (ZANAFLEX) 2 MG tablet Take 1 tablet (2 mg total) by mouth at bedtime as needed for muscle spasms. 12/25/18  Yes Dixie Dials, MD  torsemide (DEMADEX) 20 MG tablet Take 1 tablet (20 mg total) by mouth daily. 05/21/20  Yes Dixie Dials, MD  valACYclovir (VALTREX) 500 MG tablet Take 500 mg by mouth daily.   Yes [provider]  ACCU-CHEK AVIVA PLUS test strip See admin instructions. 01/29/16   [provider]  ACCU-CHEK SOFTCLIX LANCETS lancets See admin instructions. 01/30/16   [provider]  Blood Glucose Monitoring Suppl (ACCU-CHEK AVIVA PLUS) w/Device KIT See admin instructions. 01/29/16   [provider]  ferrous sulfate 325 (65 FE) MG tablet Take 1 tablet (325 mg total) by mouth daily with breakfast. 02/15/20   Dixie Dials, MD    Allergies    Amoxicillin-pot clavulanate, Bupropion, and Nicotine  Review of Systems   Review of Systems  Constitutional: Negative for chills, diaphoresis and fever.  Respiratory: Positive for shortness of breath (resolved). Negative for cough.   Cardiovascular: Negative for chest pain and leg swelling.  Gastrointestinal: Negative for abdominal pain, diarrhea, nausea and vomiting.  Genitourinary: Negative for dysuria and hematuria.  Musculoskeletal: Negative for myalgias.  Neurological: Negative for dizziness, syncope and weakness.  All other systems reviewed and are negative.   Physical Exam Updated Vital Signs BP 139/71 (BP Location: Right Arm)   Pulse 72   Temp 98.2 F (36.8 C) (Oral)   Resp 18   SpO2 97%   Physical Exam Vitals and nursing note reviewed.  Constitutional:      General: She is not in acute distress.    Appearance: She is well-developed. She is not diaphoretic.  HENT:     Head: Normocephalic and atraumatic.     Mouth/Throat:     Mouth: Mucous membranes are moist.     Pharynx: Oropharynx is clear.  Eyes:     Conjunctiva/sclera: Conjunctivae normal.  Cardiovascular:     Rate and Rhythm: Normal rate and regular rhythm.     Pulses: Normal pulses.          Radial pulses are 2+ on the right side and 2+ on the left side.       Posterior tibial pulses are 2+ on the right side and 2+ on the left side.     Heart sounds: Normal heart sounds.     Comments:  Tactile temperature in the extremities appropriate and equal  bilaterally. Pulmonary:     Effort: Tachypnea (with talking) present.     Breath sounds: Normal breath sounds.     Comments: Some conversational dyspnea.  Abdominal:     Palpations: Abdomen is soft.     Tenderness: There is no abdominal tenderness. There is no guarding.  Musculoskeletal:     Cervical back: Neck supple.     Right lower leg: No edema.     Left lower leg: No edema.  Lymphadenopathy:     Cervical: No cervical adenopathy.  Skin:    General: Skin is warm and dry.  Neurological:     Mental Status: She is alert.  Psychiatric:        Mood and Affect: Mood and affect normal.        Speech: Speech normal.        Behavior: Behavior normal.     ED Results / Procedures / Treatments   Labs (all labs ordered are listed, but only abnormal results are displayed) Labs Reviewed  BASIC METABOLIC PANEL - Abnormal; Notable for the following components:      Result Value   Potassium 2.9 (*)    CO2 21 (*)    Glucose, Bld 115 (*)    Creatinine, Ser 1.63 (*)    GFR calc non Af Amer 33 (*)    GFR calc Af Amer 38 (*)    All other components within normal limits  CBC - Abnormal; Notable for the following components:   RDW 19.5 (*)    All other components within normal limits  CBG MONITORING, ED - Abnormal; Notable for the following components:   Glucose-Capillary 155 (*)    All other components within normal limits  BRAIN NATRIURETIC PEPTIDE  TROPONIN I (HIGH SENSITIVITY)    BUN  Date Value Ref Range Status  06/22/2020 22 8 - 23 mg/dL Final  05/27/2020 60 (H) 8 - 23 mg/dL Final  05/24/2020 37 (H) 8 - 23 mg/dL Final  05/23/2020 37 (H) 8 - 23 mg/dL Final   Creatinine, Ser  Date Value Ref Range Status  06/22/2020 1.63 (H) 0.44 - 1.00 mg/dL Final  05/27/2020 2.41 (H) 0.44 - 1.00 mg/dL Final  05/24/2020 1.93 (H) 0.44 - 1.00 mg/dL Final  05/23/2020 1.87 (H) 0.44 - 1.00 mg/dL Final     EKG EKG Interpretation  Date/Time:  Monday June 22 2020 13:51:34 EDT Ventricular  Rate:  72 PR Interval:    QRS Duration: 92 QT Interval:  326 QTC Calculation: 356 R Axis:   106 Text Interpretation: Atrial fibrillation Rightward axis Septal infarct , age undetermined ST & T wave abnormality, consider lateral ischemia Abnormal ECG no acute changes Confirmed by Madalyn Rob [phone]) on 06/22/2020 9:26:01 PM   Radiology DG Chest 2 View  Result Date: 06/22/2020 CLINICAL DATA:  Shortness of breath EXAM: CHEST - 2 VIEW COMPARISON:  May 23, 2020 FINDINGS: There is cardiomegaly with pulmonary vascularity within normal limits. There is mild left midlung atelectasis. There is no edema or airspace opacity. Patient is status post median sternotomy. There are surgical clips overlying the right upper hemithorax. No pneumothorax. No bone lesions. IMPRESSION: Cardiomegaly. Postoperative changes. Mild left midlung atelectasis. No edema or airspace opacity. Electronically Signed   By: Lowella Grip III M.D.   On: 06/22/2020 15:58    Procedures Procedures (including critical care time)  Medications Ordered in ED Medications  sodium chloride flush (NS) 0.9 % injection 3 mL (has  no administration in time range)  acetaminophen (TYLENOL) tablet 650 mg (650 mg Oral Given 06/22/20 2202)  ALPRAZolam Duanne Moron) tablet 0.5 mg (0.5 mg Oral Given 06/22/20 2202)  potassium chloride SA (KLOR-CON) CR tablet 40 mEq (40 mEq Oral Given 06/22/20 2202)  metoprolol tartrate (LOPRESSOR) tablet 12.5 mg (12.5 mg Oral Given 06/22/20 2238)  prazosin (MINIPRESS) capsule 1 mg (1 mg Oral Given 06/22/20 2238)    ED Course  I have reviewed the triage vital signs and the nursing notes.  Pertinent labs & imaging results that were available during my care of the patient were reviewed by me and considered in my medical decision making (see chart for details).  Clinical Course as of Jun 23 22  Tue Jun 23, 2020  0022 Spoke with Dr. Doylene Canard, patient's cardiologist. States he will come evaluate the patient for  admission.    [SJ]    Clinical Course User Index [SJ] Tanekia Ryans C, PA-C   MDM Rules/Calculators/A&P                          Patient presents with shortness of breath this morning, resolving during wait time in the waiting room. The patient was given instructions for home care as well as return precautions. Patient voices understanding of these instructions, accepts the plan, and is comfortable with discharge.  I reviewed and interpreted the patient's labs and radiological studies. Hypokalemia noted.  Some supplementation was given here.  Patient will follow up with her PCP on this matter. Chest x-ray reassuring without acute abnormalities.  BNP elevated to a level consistent with prior admissions for CHF exacerbation.  Troponin also mildly elevated. Admitted for further management.  Findings and plan of care discussed with Madalyn Rob, MD and then with Dr. Betsey Holiday after EDP shift change. Dr. Roslynn Amble personally evaluated and examined this patient.  Vitals:   06/22/20 1727 06/22/20 1933 06/22/20 2148 06/22/20 2215  BP: (!) 156/85 139/71 (!) 196/104 (!) 183/79  Pulse: (!) 43 72 81 71  Resp: '18 18 17 19  ' Temp: 98.2 F (36.8 C)     TempSrc: Oral     SpO2: 97% 97% 98% 98%     Final Clinical Impression(s) / ED Diagnoses Final diagnoses:  Shortness of breath    Rx / DC Orders ED Discharge Orders    None       Layla Maw 06/23/20 0024    Lucrezia Starch, MD 06/23/20 2348

## 2020-06-23 ENCOUNTER — Other Ambulatory Visit: Payer: Self-pay

## 2020-06-23 DIAGNOSIS — E7800 Pure hypercholesterolemia, unspecified: Secondary | ICD-10-CM | POA: Diagnosis present

## 2020-06-23 DIAGNOSIS — Z8616 Personal history of COVID-19: Secondary | ICD-10-CM | POA: Diagnosis not present

## 2020-06-23 DIAGNOSIS — E1122 Type 2 diabetes mellitus with diabetic chronic kidney disease: Secondary | ICD-10-CM | POA: Diagnosis present

## 2020-06-23 DIAGNOSIS — R0602 Shortness of breath: Secondary | ICD-10-CM | POA: Diagnosis present

## 2020-06-23 DIAGNOSIS — Z880 Allergy status to penicillin: Secondary | ICD-10-CM | POA: Diagnosis not present

## 2020-06-23 DIAGNOSIS — F17210 Nicotine dependence, cigarettes, uncomplicated: Secondary | ICD-10-CM | POA: Diagnosis present

## 2020-06-23 DIAGNOSIS — I272 Pulmonary hypertension, unspecified: Secondary | ICD-10-CM | POA: Diagnosis present

## 2020-06-23 DIAGNOSIS — I252 Old myocardial infarction: Secondary | ICD-10-CM | POA: Diagnosis not present

## 2020-06-23 DIAGNOSIS — Z79899 Other long term (current) drug therapy: Secondary | ICD-10-CM | POA: Diagnosis not present

## 2020-06-23 DIAGNOSIS — N184 Chronic kidney disease, stage 4 (severe): Secondary | ICD-10-CM | POA: Diagnosis present

## 2020-06-23 DIAGNOSIS — E876 Hypokalemia: Secondary | ICD-10-CM | POA: Diagnosis present

## 2020-06-23 DIAGNOSIS — Z7902 Long term (current) use of antithrombotics/antiplatelets: Secondary | ICD-10-CM | POA: Diagnosis not present

## 2020-06-23 DIAGNOSIS — J441 Chronic obstructive pulmonary disease with (acute) exacerbation: Secondary | ICD-10-CM | POA: Diagnosis present

## 2020-06-23 DIAGNOSIS — Z8673 Personal history of transient ischemic attack (TIA), and cerebral infarction without residual deficits: Secondary | ICD-10-CM | POA: Diagnosis not present

## 2020-06-23 DIAGNOSIS — Z888 Allergy status to other drugs, medicaments and biological substances status: Secondary | ICD-10-CM | POA: Diagnosis not present

## 2020-06-23 DIAGNOSIS — J45901 Unspecified asthma with (acute) exacerbation: Secondary | ICD-10-CM | POA: Diagnosis present

## 2020-06-23 DIAGNOSIS — I482 Chronic atrial fibrillation, unspecified: Secondary | ICD-10-CM | POA: Diagnosis present

## 2020-06-23 DIAGNOSIS — F329 Major depressive disorder, single episode, unspecified: Secondary | ICD-10-CM | POA: Diagnosis present

## 2020-06-23 DIAGNOSIS — I5030 Unspecified diastolic (congestive) heart failure: Secondary | ICD-10-CM | POA: Diagnosis present

## 2020-06-23 DIAGNOSIS — I130 Hypertensive heart and chronic kidney disease with heart failure and stage 1 through stage 4 chronic kidney disease, or unspecified chronic kidney disease: Secondary | ICD-10-CM | POA: Diagnosis present

## 2020-06-23 DIAGNOSIS — Z7982 Long term (current) use of aspirin: Secondary | ICD-10-CM | POA: Diagnosis not present

## 2020-06-23 DIAGNOSIS — Z951 Presence of aortocoronary bypass graft: Secondary | ICD-10-CM | POA: Diagnosis not present

## 2020-06-23 LAB — BASIC METABOLIC PANEL
Anion gap: 9 (ref 5–15)
BUN: 22 mg/dL (ref 8–23)
CO2: 15 mmol/L — ABNORMAL LOW (ref 22–32)
Calcium: 7.6 mg/dL — ABNORMAL LOW (ref 8.9–10.3)
Chloride: 120 mmol/L — ABNORMAL HIGH (ref 98–111)
Creatinine, Ser: 1.32 mg/dL — ABNORMAL HIGH (ref 0.44–1.00)
GFR calc Af Amer: 49 mL/min — ABNORMAL LOW (ref >60)
GFR calc non Af Amer: 43 mL/min — ABNORMAL LOW (ref >60)
Glucose, Bld: 109 mg/dL — ABNORMAL HIGH (ref 70–99)
Potassium: 4.7 mmol/L (ref 3.5–5.1)
Sodium: 144 mmol/L (ref 135–145)

## 2020-06-23 LAB — CBG MONITORING, ED: Glucose-Capillary: 219 mg/dL — ABNORMAL HIGH (ref 70–99)

## 2020-06-23 LAB — CBC
HCT: 35.4 % — ABNORMAL LOW (ref 36.0–46.0)
Hemoglobin: 10.4 g/dL — ABNORMAL LOW (ref 12.0–15.0)
MCH: 30.3 pg (ref 26.0–34.0)
MCHC: 29.4 g/dL — ABNORMAL LOW (ref 30.0–36.0)
MCV: 103.2 fL — ABNORMAL HIGH (ref 80.0–100.0)
Platelets: 185 10*3/uL (ref 150–400)
RBC: 3.43 MIL/uL — ABNORMAL LOW (ref 3.87–5.11)
RDW: 19.5 % — ABNORMAL HIGH (ref 11.5–15.5)
WBC: 7.7 10*3/uL (ref 4.0–10.5)
nRBC: 0 % (ref 0.0–0.2)

## 2020-06-23 LAB — MAGNESIUM: Magnesium: 1.9 mg/dL (ref 1.7–2.4)

## 2020-06-23 LAB — SARS CORONAVIRUS 2 BY RT PCR (HOSPITAL ORDER, PERFORMED IN ~~LOC~~ HOSPITAL LAB): SARS Coronavirus 2: NEGATIVE

## 2020-06-23 MED ORDER — NITROGLYCERIN 0.4 MG SL SUBL: 0.4000 mg | SUBLINGUAL_TABLET | SUBLINGUAL | Status: DC | PRN

## 2020-06-23 MED ORDER — VITAMIN D 25 MCG (1000 UNIT) PO TABS
1000.0000 [IU] | ORAL_TABLET | Freq: Every day | ORAL | Status: DC
  Administered 2020-06-23 – 2020-06-24 (×2): 1000 [IU] via ORAL
  Filled 2020-06-23 (×2): qty 1

## 2020-06-23 MED ORDER — ASPIRIN EC 81 MG PO TBEC
81.0000 mg | DELAYED_RELEASE_TABLET | Freq: Every day | ORAL | Status: DC
  Administered 2020-06-23 – 2020-06-24 (×2): 81 mg via ORAL
  Filled 2020-06-23 (×2): qty 1

## 2020-06-23 MED ORDER — POTASSIUM CHLORIDE CRYS ER 10 MEQ PO TBCR
10.0000 meq | EXTENDED_RELEASE_TABLET | Freq: Two times a day (BID) | ORAL | Status: DC
  Administered 2020-06-23 – 2020-06-24 (×3): 10 meq via ORAL
  Filled 2020-06-23 (×3): qty 1

## 2020-06-23 MED ORDER — FUROSEMIDE 10 MG/ML IJ SOLN
20.0000 mg | Freq: Once | INTRAMUSCULAR | Status: AC
  Administered 2020-06-23: 20 mg via INTRAVENOUS
  Filled 2020-06-23: qty 2

## 2020-06-23 MED ORDER — DILTIAZEM HCL ER COATED BEADS 120 MG PO CP24
120.0000 mg | ORAL_CAPSULE | Freq: Every day | ORAL | Status: DC
  Administered 2020-06-23 – 2020-06-24 (×2): 120 mg via ORAL
  Filled 2020-06-23 (×3): qty 1

## 2020-06-23 MED ORDER — ALPRAZOLAM 0.25 MG PO TABS
0.2500 mg | ORAL_TABLET | Freq: Two times a day (BID) | ORAL | Status: DC | PRN
  Administered 2020-06-23 – 2020-06-24 (×3): 0.25 mg via ORAL
  Filled 2020-06-23 (×3): qty 1

## 2020-06-23 MED ORDER — ADULT MULTIVITAMIN W/MINERALS CH
1.0000 | ORAL_TABLET | Freq: Every day | ORAL | Status: DC
  Administered 2020-06-23 – 2020-06-24 (×2): 1 via ORAL
  Filled 2020-06-23 (×2): qty 1

## 2020-06-23 MED ORDER — PANTOPRAZOLE SODIUM 40 MG PO TBEC
40.0000 mg | DELAYED_RELEASE_TABLET | Freq: Two times a day (BID) | ORAL | Status: DC
  Administered 2020-06-23 – 2020-06-24 (×4): 40 mg via ORAL
  Filled 2020-06-23 (×4): qty 1

## 2020-06-23 MED ORDER — TORSEMIDE 20 MG PO TABS
20.0000 mg | ORAL_TABLET | Freq: Every day | ORAL | Status: DC
  Administered 2020-06-23 – 2020-06-24 (×2): 20 mg via ORAL
  Filled 2020-06-23 (×2): qty 1

## 2020-06-23 MED ORDER — SERTRALINE HCL 100 MG PO TABS
100.0000 mg | ORAL_TABLET | Freq: Every day | ORAL | Status: DC
  Administered 2020-06-23 (×2): 100 mg via ORAL
  Filled 2020-06-23 (×3): qty 1

## 2020-06-23 MED ORDER — PROMETHAZINE HCL 25 MG PO TABS: 25.0000 mg | ORAL_TABLET | Freq: Four times a day (QID) | ORAL | Status: DC | PRN

## 2020-06-23 MED ORDER — FERROUS SULFATE 325 (65 FE) MG PO TABS
325.0000 mg | ORAL_TABLET | Freq: Every day | ORAL | Status: DC
  Administered 2020-06-23 – 2020-06-24 (×2): 325 mg via ORAL
  Filled 2020-06-23 (×2): qty 1

## 2020-06-23 MED ORDER — POTASSIUM CHLORIDE CRYS ER 10 MEQ PO TBCR: 40.0000 meq | EXTENDED_RELEASE_TABLET | Freq: Three times a day (TID) | ORAL | Status: DC

## 2020-06-23 MED ORDER — DIGOXIN 125 MCG PO TABS
0.1250 mg | ORAL_TABLET | ORAL | Status: DC
  Administered 2020-06-24: 0.125 mg via ORAL
  Filled 2020-06-23: qty 1

## 2020-06-23 MED ORDER — TIZANIDINE HCL 2 MG PO TABS
2.0000 mg | ORAL_TABLET | Freq: Every evening | ORAL | Status: DC | PRN
  Filled 2020-06-23: qty 1

## 2020-06-23 MED ORDER — MAGNESIUM SULFATE IN D5W 1-5 GM/100ML-% IV SOLN
1.0000 g | Freq: Once | INTRAVENOUS | Status: AC
  Administered 2020-06-23: 1 g via INTRAVENOUS
  Filled 2020-06-23: qty 100

## 2020-06-23 MED ORDER — GEMFIBROZIL 600 MG PO TABS
600.0000 mg | ORAL_TABLET | Freq: Two times a day (BID) | ORAL | Status: DC
  Administered 2020-06-23 – 2020-06-24 (×3): 600 mg via ORAL
  Filled 2020-06-23 (×5): qty 1

## 2020-06-23 MED ORDER — METHYLPREDNISOLONE SODIUM SUCC 125 MG IJ SOLR
125.0000 mg | Freq: Once | INTRAMUSCULAR | Status: AC
  Administered 2020-06-23: 125 mg via INTRAVENOUS
  Filled 2020-06-23: qty 2

## 2020-06-23 MED ORDER — ACETAMINOPHEN 325 MG PO TABS: 650.0000 mg | ORAL_TABLET | ORAL | Status: DC | PRN

## 2020-06-23 MED ORDER — PRAZOSIN HCL 1 MG PO CAPS
1.0000 mg | ORAL_CAPSULE | Freq: Two times a day (BID) | ORAL | Status: DC
  Administered 2020-06-23 – 2020-06-24 (×3): 1 mg via ORAL
  Filled 2020-06-23 (×5): qty 1

## 2020-06-23 MED ORDER — ALBUTEROL SULFATE (2.5 MG/3ML) 0.083% IN NEBU: 3.0000 mL | INHALATION_SOLUTION | Freq: Four times a day (QID) | RESPIRATORY_TRACT | Status: DC | PRN

## 2020-06-23 MED ORDER — POTASSIUM CHLORIDE CRYS ER 20 MEQ PO TBCR
40.0000 meq | EXTENDED_RELEASE_TABLET | Freq: Once | ORAL | Status: AC
  Administered 2020-06-23: 40 meq via ORAL
  Filled 2020-06-23: qty 2

## 2020-06-23 MED ORDER — APIXABAN 2.5 MG PO TABS
2.5000 mg | ORAL_TABLET | Freq: Two times a day (BID) | ORAL | Status: DC
  Administered 2020-06-23 – 2020-06-24 (×4): 2.5 mg via ORAL
  Filled 2020-06-23 (×5): qty 1

## 2020-06-23 MED ORDER — METOPROLOL TARTRATE 12.5 MG HALF TABLET
12.5000 mg | ORAL_TABLET | Freq: Two times a day (BID) | ORAL | Status: DC
  Administered 2020-06-23 – 2020-06-24 (×3): 12.5 mg via ORAL
  Filled 2020-06-23 (×4): qty 1

## 2020-06-23 MED ORDER — ALLOPURINOL 100 MG PO TABS
100.0000 mg | ORAL_TABLET | Freq: Every day | ORAL | Status: DC
  Administered 2020-06-23 – 2020-06-24 (×2): 100 mg via ORAL
  Filled 2020-06-23 (×3): qty 1

## 2020-06-23 MED ORDER — VALACYCLOVIR HCL 500 MG PO TABS
500.0000 mg | ORAL_TABLET | Freq: Every day | ORAL | Status: DC
  Administered 2020-06-23 – 2020-06-24 (×2): 500 mg via ORAL
  Filled 2020-06-23 (×3): qty 1

## 2020-06-23 MED ORDER — OXYMETAZOLINE HCL 0.05 % NA SOLN: 1.0000 | Freq: Two times a day (BID) | NASAL | Status: DC | PRN

## 2020-06-23 MED ORDER — LOPERAMIDE HCL 2 MG PO CAPS: 2.0000 mg | ORAL_CAPSULE | Freq: Every day | ORAL | Status: DC | PRN

## 2020-06-23 NOTE — ED Notes (Signed)
First attempt to call report unsuccessful. 

## 2020-06-23 NOTE — ED Notes (Signed)
Patient walked to bathroom using cane. Denies any dizziness or weakness.

## 2020-06-23 NOTE — ED Notes (Signed)
Ambulated patient to bathroom.

## 2020-06-23 NOTE — H&P (Signed)
Referring Physician:  NEDDIE STEEDMAN is an 64 y.o. female.                       Chief Complaint: Shortness of breath  HPI: 65 years old white female with PMH of Hypertension, CAD, S/P CABG, S/P AV replacement, chronic atrial fibrillation, COPD, CKD, IV, anxiety, panic attack and tobacco use disorder has 1 day of shortness of breath that got better by wait in emergency room. She has chronically elevated BNP. Her chest x-ray is negative for pulmonary edema. She denies orthopnea or leg swelling or weight gain. She has advanced COPD and takes oxygen at home. Her monitor shows atrial fibrillation with controlled ventricular response. Herblood work shows stable creatinine but low potassium of 2.9 meq.  Past Medical History:  Diagnosis Date  . Depression   . Diabetes mellitus without complication ([place])   . Heart disease, congenital   . High cholesterol   . Hypertension   . Panic attack   . Stroke [facility])       Past Surgical History:  Procedure Laterality Date  . CARDIAC SURGERY  2011   CABG  . COLONOSCOPY WITH PROPOFOL N/A 02/08/2018   Procedure: COLONOSCOPY WITH PROPOFOL;  Surgeon: Otis Brace, MD;  [facility];  Service: Gastroenterology;  Laterality: N/A;  . COLONOSCOPY WITH PROPOFOL N/A 12/16/2018   Procedure: COLONOSCOPY WITH PROPOFOL;  Surgeon: Ronald Lobo, MD;  [facility];  Service: Endoscopy;  Laterality: N/A;  . DILATION AND EVACUATION     Patient states she had an abortion 30 or 40 years ago  . ESOPHAGOGASTRODUODENOSCOPY (EGD) WITH PROPOFOL Left 02/05/2018   Procedure: ESOPHAGOGASTRODUODENOSCOPY (EGD) WITH PROPOFOL;  Surgeon: Otis Brace, MD;  [facility];  Service: Gastroenterology;  Laterality: Left;  . IR ANGIOGRAM SELECTIVE EACH ADDITIONAL VESSEL  12/18/2018  . IR ANGIOGRAM SELECTIVE EACH ADDITIONAL VESSEL  12/18/2018  . IR ANGIOGRAM SELECTIVE EACH ADDITIONAL VESSEL  12/18/2018  . IR ANGIOGRAM SELECTIVE EACH ADDITIONAL VESSEL   12/18/2018  . IR ANGIOGRAM VISCERAL SELECTIVE  12/18/2018  . IR EMBO ART  VEN HEMORR LYMPH EXTRAV  INC GUIDE ROADMAPPING  12/18/2018  . IR US GUIDE VASC ACCESS RIGHT  12/18/2018  . LEFT HEART CATH AND CORONARY ANGIOGRAPHY N/A 02/10/2020   Procedure: LEFT HEART CATH AND CORONARY ANGIOGRAPHY;  Surgeon: Dixie Dials, MD;  Location: [facility];  Service: Cardiovascular;  Laterality: N/A;  . WISDOM TOOTH EXTRACTION      Family History  Problem Relation Age of Onset  . Cancer Mother        uterian OR cervical  . Cancer Father        lung  . Leukemia Brother   . Cancer Sister        breast   Social History:  reports that she has been smoking cigarettes. She has a 50.00 pack-year smoking history. She has never used smokeless tobacco. She reports that she does not drink alcohol and does not use drugs.  Allergies:  Allergies  Allergen Reactions  . Amoxicillin-Pot Clavulanate Diarrhea    Has patient had a PCN reaction causing immediate rash, facial/tongue/throat swelling, SOB or lightheadedness with hypotension: Unknown Has patient had a PCN reaction causing severe rash involving mucus membranes or skin necrosis:UnknoWN Has patient had a PCN reaction that required hospitalization: Unknown Has patient had a PCN reaction occurring within the last 10 years: Unknown If all of the above answers are "NO", then may proceed with Cephalosporin use.   Marland Kitchen  Bupropion Nausea Only  . Nicotine Nausea Only and Rash    Reaction to patches    (Not in a hospital admission)   Results for orders placed or performed during the hospital encounter of 06/22/20 (from the past 48 hour(s))  Basic metabolic panel     Status: Abnormal   Collection Time: 06/22/20  2:23 PM  Result Value Ref Range   Sodium 143 135 - 145 mmol/L   Potassium 2.9 (L) 3.5 - 5.1 mmol/L   Chloride 109 98 - 111 mmol/L   CO2 21 (L) 22 - 32 mmol/L   Glucose, Bld 115 (H) 70 - 99 mg/dL    Comment: Glucose reference range applies only  to samples taken after fasting for at least 8 hours.   BUN 22 8 - 23 mg/dL   Creatinine, Ser 1.63 (H) 0.44 - 1.00 mg/dL   Calcium 9.9 8.9 - 10.3 mg/dL   GFR calc non Af Amer 33 (L) >60 mL/min   GFR calc Af Amer 38 (L) >60 mL/min   Anion gap 13 5 - 15    Comment: Performed at [facility] [address], [place], [place] [address]  CBC     Status: Abnormal   Collection Time: 06/22/20  2:23 PM  Result Value Ref Range   WBC 9.3 4.0 - 10.5 K/uL   RBC 4.02 3.87 - 5.11 MIL/uL   Hemoglobin 12.0 12.0 - 15.0 g/dL   HCT 40.0 36 - 46 %   MCV 99.5 80.0 - 100.0 fL   MCH 29.9 26.0 - 34.0 pg   MCHC 30.0 30.0 - 36.0 g/dL   RDW 19.5 (H) 11.5 - 15.5 %   Platelets 290 150 - 400 K/uL   nRBC 0.0 0.0 - 0.2 %    Comment: Performed at [facility] [address] [place], [place] [address]  Troponin I (High Sensitivity)     Status: Abnormal   Collection Time: 06/22/20 10:04 PM  Result Value Ref Range   Troponin I (High Sensitivity) 67 (H) <18 ng/L    Comment: (NOTE) Elevated high sensitivity troponin I (hsTnI) values and significant  changes across serial measurements may suggest ACS but many other  chronic and acute conditions are known to elevate hsTnI results.  Refer to the "Links" section for chest pain algorithms and additional  guidance. Performed at [facility], [place] [address], [place], [place] [address]   Brain natriuretic peptide     Status: Abnormal   Collection Time: 06/22/20 10:04 PM  Result Value Ref Range   B Natriuretic Peptide 3,846.3 (H) 0.0 - 100.0 pg/mL    Comment: Performed at [facility] [address] [place], [place] [address]  POC CBG, ED     Status: Abnormal   Collection Time: 06/22/20 10:41 PM  Result Value Ref Range   Glucose-Capillary 155 (H) 70 - 99 mg/dL    Comment: Glucose reference range applies only to samples taken after fasting for at least 8 hours.   DG Chest 2 View  Result Date: 06/22/2020 CLINICAL DATA:  Shortness of breath  EXAM: CHEST - 2 VIEW COMPARISON:  May 23, 2020 FINDINGS: There is cardiomegaly with pulmonary vascularity within normal limits. There is mild left midlung atelectasis. There is no edema or airspace opacity. Patient is status post median sternotomy. There are surgical clips overlying the right upper hemithorax. No pneumothorax. No bone lesions. IMPRESSION: Cardiomegaly. Postoperative changes. Mild left midlung atelectasis. No edema or airspace  opacity. Electronically Signed   By: Lowella Grip III M.D.   On: 06/22/2020 15:58    Review Of Systems Constitutional: No fever, chills, weight loss or gain. Eyes: No vision change, wears glasses. No discharge or pain. Ears: No hearing loss, No tinnitus. Respiratory: Positive asthma, COPD, pneumonias, shortness of breath. No hemoptysis. Cardiovascular: Positive chest pain, palpitation, leg edema. Gastrointestinal: Positive nausea, vomiting, diarrhea, constipation, GI bleed. No hepatitis. Genitourinary: No dysuria, hematuria, kidney stone. No incontinance. CKD, IIIa Neurological: Positive headache, stroke, no seizures.  Psychiatry: No psych facility admission for anxiety, depression, suicide. No detox. Skin: No rash. Musculoskeletal: Positive joint pain, fibromyalgia. No neck pain, back pain. Lymphadenopathy: No lymphadenopathy. Hematology: Positive anemia, no easy bruising.   Blood pressure (!) 102/49, pulse 60, temperature 98.2 F (36.8 C), temperature source Oral, resp. rate (!) 29, height 5\' 3"  (1.6 m), weight 72.8 kg, SpO2 95 %. Body mass index is 28.43 kg/m. General appearance: alert, cooperative, appears stated age and mild respiratory distress Head: Normocephalic, atraumatic. Eyes: Brown eyes, pink conjunctiva, corneas clear. PERRL, EOM's intact. Neck: No adenopathy, no carotid bruit, no JVD, supple, symmetrical, trachea midline and thyroid not enlarged. Resp: Clear to auscultation bilaterally. Cardio: Irregular rate and rhythm, S1, S2  normal, II/VI systolic murmur, no click, rub or gallop GI: Soft, non-tender; bowel sounds normal; no organomegaly. Extremities: Trace edema, no cyanosis or clubbing. Skin: Warm and dry.  Neurologic: Alert and oriented X 3, normal strength. Normal coordination..  Assessment/Plan Acute exacerbation of COPD Asthma Chronic systolic and diastolic left heart failure CAD CABG S/P bioprosthetic AV Severe MR Moderate TR Mild pulmonary systolic hypertension Severe hypokalemia Hyperlipidemia S/P stroke Panic attack CKD, IIIa Type 2 DM  Admit. IV solumedrol. Continue home oxygen and medications. Replace potassium and magnesium.  Time spent: Review of old records, Lab, x-rays, EKG, other cardiac tests, examination, discussion with patient over 70 minutes.  [name], MD  06/23/2020, 1:21 AM

## 2020-06-24 LAB — BASIC METABOLIC PANEL
Anion gap: 11 (ref 5–15)
BUN: 40 mg/dL — ABNORMAL HIGH (ref 8–23)
CO2: 20 mmol/L — ABNORMAL LOW (ref 22–32)
Calcium: 9.7 mg/dL (ref 8.9–10.3)
Chloride: 109 mmol/L (ref 98–111)
Creatinine, Ser: 2.12 mg/dL — ABNORMAL HIGH (ref 0.44–1.00)
GFR calc Af Amer: 28 mL/min — ABNORMAL LOW (ref >60)
GFR calc non Af Amer: 24 mL/min — ABNORMAL LOW (ref >60)
Glucose, Bld: 150 mg/dL — ABNORMAL HIGH (ref 70–99)
Potassium: 4.7 mmol/L (ref 3.5–5.1)
Sodium: 140 mmol/L (ref 135–145)

## 2020-06-24 MED ORDER — PREDNISONE 10 MG PO TABS
10.0000 mg | ORAL_TABLET | Freq: Every day | ORAL | Status: DC
  Filled 2020-06-24: qty 1

## 2020-06-24 MED ORDER — POTASSIUM CHLORIDE CRYS ER 10 MEQ PO TBCR: 10.0000 meq | EXTENDED_RELEASE_TABLET | Freq: Every day | ORAL | Status: DC

## 2020-06-24 MED ORDER — TORSEMIDE 20 MG PO TABS: 20.0000 mg | ORAL_TABLET | ORAL | Status: DC

## 2020-06-24 MED ORDER — PREDNISONE 10 MG PO TABS: 10.0000 mg | ORAL_TABLET | Freq: Every day | ORAL | 0 refills | Status: DC

## 2020-06-24 NOTE — Evaluation (Signed)
Physical Therapy Evaluation Patient Details Name: Renee Griffin MRN: [id number] DOB: 1956/07/28 Today's Date: 06/24/2020   History of Present Illness  Pt is a 64 y.o. F with significant PMH of HTN, CAD s/p CABG, s/p AV replacement, chronic atrial fibrillation, COPD, anxiety, tobacco use disorder who presents with shortness of breath. Pt breathing improved with IV solumedrol followed by PO prednisone.   Clinical Impression  Patient evaluated by Physical Therapy with no further acute PT needs identified. Prior to admission, she lives in a first floor apartment, is independent with ADL's/IADL's, and uses quad cane for household and limited community mobility.  Pt reporting fatigue, but otherwise appears fairly close to her baseline. Denies history of recent falls. Pt ambulating 80 feet with a quad cane at a supervision level. Further distance limited by lower extremity fatigue. Pt politely declining physical therapy follow up services. All education has been completed and the patient has no further questions. PT is signing off. Thank you for this referral.     Follow Up Recommendations No PT follow up;Supervision - Intermittent (declining HH services)    Equipment Recommendations  None recommended by PT    Recommendations for Other Services       Precautions / Restrictions Precautions Precautions: Fall Restrictions Weight Bearing Restrictions: No      Mobility  Bed Mobility Overal bed mobility: Modified Independent             General bed mobility comments: Increased time/effort, use of bed rail  Transfers Overall transfer level: Needs assistance Equipment used: Quad cane Transfers: Sit to/from Stand Sit to Stand: Supervision            Ambulation/Gait Ambulation/Gait assistance: Supervision Gait Distance (Feet): 80 Feet Assistive device: Quad cane Gait Pattern/deviations: Step-to pattern Gait velocity: decreased Gait velocity interpretation: <1.8 ft/sec, indicate  of risk for recurrent falls General Gait Details: Slow pace, step to pattern. No gross unsteadiness  Stairs            Wheelchair Mobility    Modified Rankin (Stroke Patients Only)       Balance Overall balance assessment: Mild deficits observed, not formally tested                                           Pertinent Vitals/Pain Pain Assessment: No/denies pain    Home Living Family/patient expects to be discharged to:: Private residence Living Arrangements: Alone Available Help at Discharge: Family;Available PRN/intermittently (brother) Type of Home: Apartment Home Access: Stairs to enter Entrance Stairs-Rails: None Entrance Stairs-Number of Steps: 1 Home Layout: One level Home Equipment: Cane - quad;Walker - 2 wheels;Grab bars - tub/shower      Prior Function Level of Independence: Independent with assistive device(s)         Comments: Reports using quad cane at baseline;no assist for ADL/IADL. Pt drives/manages meds and grocery shops     Hand Dominance   Dominant Hand: Left    Extremity/Trunk Assessment   Upper Extremity Assessment Upper Extremity Assessment: Overall WFL for tasks assessed    Lower Extremity Assessment Lower Extremity Assessment: RLE deficits/detail;LLE deficits/detail RLE Deficits / Details: Grossly 4/5 LLE Deficits / Details: Grossly 4/5       Communication   Communication: No difficulties  Cognition Arousal/Alertness: Awake/alert Behavior During Therapy: Flat affect Overall Cognitive Status: Within Functional Limits for tasks assessed  General Comments      Exercises     Assessment/Plan    PT Assessment Patent does not need any further PT services  PT Problem List         PT Treatment Interventions      PT Goals (Current goals can be found in the Care Plan section)  Acute Rehab PT Goals Patient Stated Goal: none stated PT Goal  Formulation: All assessment and education complete, DC therapy    Frequency     Barriers to discharge        Co-evaluation               AM-PAC PT "6 Clicks" Mobility  Outcome Measure Help needed turning from your back to your side while in a flat bed without using bedrails?: None Help needed moving from lying on your back to sitting on the side of a flat bed without using bedrails?: None Help needed moving to and from a bed to a chair (including a wheelchair)?: None Help needed standing up from a chair using your arms (e.g., wheelchair or bedside chair)?: None Help needed to walk in hospital room?: None Help needed climbing 3-5 steps with a railing? : A Little 6 Click Score: 23    End of Session   Activity Tolerance: Patient tolerated treatment well Patient left: in bed;with call bell/phone within reach   PT Visit Diagnosis: Difficulty in walking, not elsewhere classified (R26.2)    Time: 1005-1017 PT Time Calculation (min) (ACUTE ONLY): 12 min   Charges:   PT Evaluation $PT Eval Moderate Complexity: 1 Mod            Wyona Almas, PT, DPT Acute Rehabilitation Services Pager [phone] Office [phone]   Deno Etienne 06/24/2020, 11:19 AM

## 2020-06-24 NOTE — Progress Notes (Signed)
Patient is declining to take prednisone upon discharge. Concerned about weight gain side effect. MD Doylene Canard paged.

## 2020-06-24 NOTE — Discharge Summary (Signed)
Physician Discharge Summary  Patient ID: Renee Griffin MRN: [id number] DOB/AGE: February 22, 1956 64 y.o.  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 Severe MR Moderate TR Mild pulmonary systolic hypertension Severe hypokalemia Hyperlipidemia S/P stroke Panic attack CKD, IIIa Type 2 DM  Discharge Diagnoses:  Principle Problem: * Acute exacerbation of chronic obstructive pulmonary disease (COPD) (HCC) * Active Problems:  Chronic systolic and diastolic left heart failure  COPD with hypoxia  Asthma  CKD, IV  Hypertension  CAD  CABG  S/P bioprosthetic AV  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 Patient's breathing improved with IV solumedrol followed by PO prednisone. She was reminded to use her home oxygen for at least 12 hours a day if not 24 hours. She was using oxygen few hours a day as needed.  She refused [facility] placement. Her Diet, activity and medications were reviewed with her. She was discharged home in stable condition. She will see me in 1 week.  Consults: cardiology  Significant Diagnostic Studies: labs: Near normal CBC except Hgb of 10.4 gm. Low potassium of 2.9 followed by 4.7 meq post supplementation. Magnesium was 1.9 mg. Creatinine is 1.32  to 2.12 mg.  EKG: atrial fibrillation and Old Anteroseptal wall MI  Chest x-ray: Cardiomegaly without edema or pneumonia.  Treatments: Cardiac meds: metoprolol, diltiazem, digoxin, Apixaban and steroids: Solumedrol followed by prednisone.  Discharge Exam: Blood pressure 116/65, pulse 66, temperature (!) 97.5 F (36.4 C), temperature source Oral, resp. rate 18, height '5\' 3"'  (1.6 m), weight 67.6 kg, SpO2 94 %. General appearance: alert, cooperative and appears stated age. Head: Normocephalic, atraumatic. Eyes: Blue eyes, wears glasses, pink conjunctiva, corneas clear. PERRL, EOM's intact.  Neck: No adenopathy, no carotid bruit, no JVD, supple, symmetrical, trachea midline and thyroid not enlarged. Resp: Clear to auscultation bilaterally. Cardio: Irregular rate and rhythm, S1, S2 normal, II/VI systolic murmur, no click, rub or gallop. GI: Soft, non-tender; bowel sounds normal; no organomegaly. Extremities: No edema, cyanosis or clubbing. Skin: Warm and dry.  Neurologic: Alert and oriented X 3, normal strength and tone. Normal coordination and slow gait with cane use.  Disposition: Discharge disposition: 01-Home or Self Care        Allergies as of 06/24/2020      Reactions   Amoxicillin-pot Clavulanate Diarrhea   Has patient had a PCN reaction causing immediate rash, facial/tongue/throat swelling, SOB or lightheadedness with hypotension: Unknown Has patient had a PCN reaction causing severe rash involving mucus membranes or skin necrosis:UnknoWN Has patient had a PCN reaction that required hospitalization: Unknown Has patient had a PCN reaction occurring within the last 10 years: Unknown If all of the above answers are "NO", then may proceed with Cephalosporin use.   Bupropion Nausea Only   Nicotine Nausea Only, Rash   Reaction to patches  Medication List    TAKE these medications   Accu-Chek Aviva Plus test strip Generic drug: glucose blood See admin instructions.    Accu-Chek Aviva Plus w/Device Kit See admin instructions.   Accu-Chek Softclix Lancets lancets See admin instructions.   acetaminophen 325 MG tablet Commonly known as: TYLENOL Take 2 tablets (650 mg total) by mouth every 4 (four) hours as needed for headache or mild pain.   albuterol 108 (90 Base) MCG/ACT inhaler Commonly known as: VENTOLIN HFA Inhale 2 puffs into the lungs every 6 (six) hours as needed for wheezing or shortness of breath.   allopurinol 100 MG tablet Commonly known as: ZYLOPRIM Take 1 tablet (100 mg total) by mouth daily.   ALPRAZolam 0.5 MG tablet Commonly known as: XANAX Take 1 tablet (0.5 mg total) by mouth 2 (two) times daily as needed for anxiety.   apixaban 2.5 MG Tabs tablet Commonly known as: ELIQUIS Take 1 tablet (2.5 mg total) by mouth 2 (two) times daily.   aspirin EC 81 MG tablet Take 1 tablet (81 mg total) by mouth daily.   cholecalciferol 25 MCG (1000 UNIT) tablet Commonly known as: VITAMIN D3 Take 1,000 Units by mouth daily.   digoxin 0.125 MG tablet Commonly known as: LANOXIN Take 1 tablet (0.125 mg total) by mouth every Monday, Wednesday, and Friday.   diltiazem 120 MG 24 hr capsule Commonly known as: CARDIZEM CD Take 1 capsule (120 mg total) by mouth daily.   ferrous sulfate 325 (65 FE) MG tablet Take 1 tablet (325 mg total) by mouth daily with breakfast.   gemfibrozil 600 MG tablet Commonly known as: LOPID Take 600 mg by mouth 2 (two) times daily.   loperamide 2 MG capsule Commonly known as: IMODIUM Take 1 capsule (2 mg total) by mouth 4 (four) times daily as needed for diarrhea or loose stools. What changed: when to take this   metoprolol tartrate 25 MG tablet Commonly known as: LOPRESSOR Take 0.5 tablets (12.5 mg total) by mouth 2 (two) times daily.   multivitamin with minerals Tabs tablet Take 1 tablet by mouth daily.   nitroGLYCERIN 0.4 MG SL tablet Commonly known as: NITROSTAT Place 1 tablet (0.4 mg total) under  the tongue every 5 (five) minutes x 3 doses as needed for chest pain.   oxymetazoline 0.05 % nasal spray Commonly known as: AFRIN Place 1 spray into both nostrils 2 (two) times daily.   pantoprazole 40 MG tablet Commonly known as: PROTONIX Take 1 tablet (40 mg total) by mouth 2 (two) times daily. What changed:   when to take this  reasons to take this   potassium chloride 10 MEQ tablet Commonly known as: KLOR-CON Take 10 mEq by mouth daily.   prazosin 1 MG capsule Commonly known as: MINIPRESS Take 1 capsule (1 mg total) by mouth 2 (two) times daily.   predniSONE 10 MG tablet Commonly known as: DELTASONE Take 1 tablet (10 mg total) by mouth daily before breakfast. After 5 days decrease dose to 5 mg.  (1/2 of 10) for 10 days.   promethazine 25 MG tablet Commonly known as: PHENERGAN Take 1 tablet (25 mg total) by mouth every 6 (six) hours as needed for nausea or vomiting.   sertraline 100 MG tablet Commonly known as: ZOLOFT Take 100 mg by mouth at bedtime.   tiZANidine 2 MG tablet Commonly known as: ZANAFLEX Take 1 tablet (2 mg total) by mouth at bedtime as needed for muscle spasms.   torsemide 20 MG tablet  Commonly known as: DEMADEX Take 1 tablet (20 mg total) by mouth every Monday, Wednesday, and Friday. What changed: when to take this   valACYclovir 500 MG tablet Commonly known as: VALTREX Take 500 mg by mouth daily.       Follow-up Information    Dixie Dials, MD. Schedule an appointment as soon as possible for a visit in 1 week(s).   Specialty: Cardiology Contact information: [place] [address] [phone]               Time spent: Review of old chart, current chart, lab, x-ray, cardiac tests and discussion with patient over 60 minutes.  Signed: [name] 06/24/2020, 9:49 AM

## 2020-06-24 NOTE — Progress Notes (Signed)
Nsg Discharge Note  Admit Date:  06/22/2020 Discharge date: 06/24/2020   ASHLEYMARIE GRANDERSON to be D/C'd Home per MD order.  AVS completed.  Copy for chart, and copy for patient signed, and dated. Patient/caregiver able to verbalize understanding.  Discharge Medication: Allergies as of 06/24/2020      Reactions   Amoxicillin-pot Clavulanate Diarrhea   Has patient had a PCN reaction causing immediate rash, facial/tongue/throat swelling, SOB or lightheadedness with hypotension: Unknown Has patient had a PCN reaction causing severe rash involving mucus membranes or skin necrosis:UnknoWN Has patient had a PCN reaction that required hospitalization: Unknown Has patient had a PCN reaction occurring within the last 10 years: Unknown If all of the above answers are "NO", then may proceed with Cephalosporin use.   Bupropion Nausea Only   Nicotine Nausea Only, Rash   Reaction to patches      Medication List    TAKE these medications   Accu-Chek Aviva Plus test strip Generic drug: glucose blood See admin instructions.   Accu-Chek Aviva Plus w/Device Kit See admin instructions.   Accu-Chek Softclix Lancets lancets See admin instructions.   acetaminophen 325 MG tablet Commonly known as: TYLENOL Take 2 tablets (650 mg total) by mouth every 4 (four) hours as needed for headache or mild pain.   albuterol 108 (90 Base) MCG/ACT inhaler Commonly known as: VENTOLIN HFA Inhale 2 puffs into the lungs every 6 (six) hours as needed for wheezing or shortness of breath.   allopurinol 100 MG tablet Commonly known as: ZYLOPRIM Take 1 tablet (100 mg total) by mouth daily.   ALPRAZolam 0.5 MG tablet Commonly known as: XANAX Take 1 tablet (0.5 mg total) by mouth 2 (two) times daily as needed for anxiety.   apixaban 2.5 MG Tabs tablet Commonly known as: ELIQUIS Take 1 tablet (2.5 mg total) by mouth 2 (two) times daily.   aspirin EC 81 MG tablet Take 1 tablet (81 mg total) by mouth daily.    cholecalciferol 25 MCG (1000 UNIT) tablet Commonly known as: VITAMIN D3 Take 1,000 Units by mouth daily.   digoxin 0.125 MG tablet Commonly known as: LANOXIN Take 1 tablet (0.125 mg total) by mouth every Monday, Wednesday, and Friday.   diltiazem 120 MG 24 hr capsule Commonly known as: CARDIZEM CD Take 1 capsule (120 mg total) by mouth daily.   ferrous sulfate 325 (65 FE) MG tablet Take 1 tablet (325 mg total) by mouth daily with breakfast.   gemfibrozil 600 MG tablet Commonly known as: LOPID Take 600 mg by mouth 2 (two) times daily.   loperamide 2 MG capsule Commonly known as: IMODIUM Take 1 capsule (2 mg total) by mouth 4 (four) times daily as needed for diarrhea or loose stools. What changed: when to take this   metoprolol tartrate 25 MG tablet Commonly known as: LOPRESSOR Take 0.5 tablets (12.5 mg total) by mouth 2 (two) times daily.   multivitamin with minerals Tabs tablet Take 1 tablet by mouth daily.   nitroGLYCERIN 0.4 MG SL tablet Commonly known as: NITROSTAT Place 1 tablet (0.4 mg total) under the tongue every 5 (five) minutes x 3 doses as needed for chest pain.   oxymetazoline 0.05 % nasal spray Commonly known as: AFRIN Place 1 spray into both nostrils 2 (two) times daily.   pantoprazole 40 MG tablet Commonly known as: PROTONIX Take 1 tablet (40 mg total) by mouth 2 (two) times daily. What changed:   when to take this  reasons to take this  potassium chloride 10 MEQ tablet Commonly known as: KLOR-CON Take 10 mEq by mouth daily.   prazosin 1 MG capsule Commonly known as: MINIPRESS Take 1 capsule (1 mg total) by mouth 2 (two) times daily.   predniSONE 10 MG tablet Commonly known as: DELTASONE Take 1 tablet (10 mg total) by mouth daily before breakfast. After 5 days decrease dose to 5 mg.  (1/2 of 10) for 10 days.   promethazine 25 MG tablet Commonly known as: PHENERGAN Take 1 tablet (25 mg total) by mouth every 6 (six) hours as needed for  nausea or vomiting.   sertraline 100 MG tablet Commonly known as: ZOLOFT Take 100 mg by mouth at bedtime.   tiZANidine 2 MG tablet Commonly known as: ZANAFLEX Take 1 tablet (2 mg total) by mouth at bedtime as needed for muscle spasms.   torsemide 20 MG tablet Commonly known as: DEMADEX Take 1 tablet (20 mg total) by mouth every Monday, Wednesday, and Friday. What changed: when to take this   valACYclovir 500 MG tablet Commonly known as: VALTREX Take 500 mg by mouth daily.       Discharge Assessment: Vitals:   06/24/20 0858 06/24/20 1100  BP: 116/65 113/68  Pulse: 66 (!) 59  Resp:  18  Temp:  (!) 97.5 F (36.4 C)  SpO2: 94% 96%   Skin clean, dry and intact without evidence of skin break down, no evidence of skin tears noted. IV catheter discontinued intact. Site without signs and symptoms of complications - no redness or edema noted at insertion site, patient denies c/o pain - only slight tenderness at site.  Dressing with slight pressure applied.  D/c Instructions-Education: Discharge instructions given to patient/family with verbalized understanding. D/c education completed with patient/family including follow up instructions, medication list, d/c activities limitations if indicated, with other d/c instructions as indicated by MD - patient able to verbalize understanding, all questions fully answered. Patient instructed to return to ED, call 911, or call MD for any changes in condition.  Patient escorted via [place], and D/C home via private auto.  Erasmo Leventhal, RN 06/24/2020 2:17 PM

## 2020-07-12 ENCOUNTER — Emergency Department (HOSPITAL_COMMUNITY)
Admission: EM | Admit: 2020-07-12 | Discharge: 2020-07-13 | Disposition: A | Discharge status: Home / Self Care | Payer: Medicare Other | Attending: Emergency Medicine | Admitting: Emergency Medicine

## 2020-07-12 DIAGNOSIS — I110 Hypertensive heart disease with heart failure: Secondary | ICD-10-CM | POA: Insufficient documentation

## 2020-07-12 DIAGNOSIS — E119 Type 2 diabetes mellitus without complications: Secondary | ICD-10-CM | POA: Insufficient documentation

## 2020-07-12 DIAGNOSIS — Z20822 Contact with and (suspected) exposure to covid-19: Secondary | ICD-10-CM | POA: Diagnosis not present

## 2020-07-12 DIAGNOSIS — Z79899 Other long term (current) drug therapy: Secondary | ICD-10-CM | POA: Insufficient documentation

## 2020-07-12 DIAGNOSIS — F17210 Nicotine dependence, cigarettes, uncomplicated: Secondary | ICD-10-CM | POA: Diagnosis not present

## 2020-07-12 DIAGNOSIS — R0602 Shortness of breath: Secondary | ICD-10-CM | POA: Diagnosis present

## 2020-07-12 DIAGNOSIS — Z7982 Long term (current) use of aspirin: Secondary | ICD-10-CM | POA: Insufficient documentation

## 2020-07-12 DIAGNOSIS — I5023 Acute on chronic systolic (congestive) heart failure: Secondary | ICD-10-CM | POA: Diagnosis not present

## 2020-07-12 DIAGNOSIS — Z7901 Long term (current) use of anticoagulants: Secondary | ICD-10-CM | POA: Diagnosis not present

## 2020-07-12 DIAGNOSIS — Z8673 Personal history of transient ischemic attack (TIA), and cerebral infarction without residual deficits: Secondary | ICD-10-CM | POA: Insufficient documentation

## 2020-07-12 DIAGNOSIS — I509 Heart failure, unspecified: Secondary | ICD-10-CM

## 2020-07-12 NOTE — ED Triage Notes (Signed)
The pt arrived by gems from home hysterical c/o son  With swollen feet and ankles

## 2020-07-13 ENCOUNTER — Encounter (HOSPITAL_COMMUNITY): Payer: Self-pay | Admitting: *Deleted

## 2020-07-13 ENCOUNTER — Other Ambulatory Visit: Payer: Self-pay

## 2020-07-13 ENCOUNTER — Emergency Department (HOSPITAL_COMMUNITY): Payer: Medicare Other

## 2020-07-13 DIAGNOSIS — I110 Hypertensive heart disease with heart failure: Secondary | ICD-10-CM | POA: Diagnosis not present

## 2020-07-13 LAB — BASIC METABOLIC PANEL
Anion gap: 15 (ref 5–15)
BUN: 31 mg/dL — ABNORMAL HIGH (ref 8–23)
CO2: 21 mmol/L — ABNORMAL LOW (ref 22–32)
Calcium: 9.9 mg/dL (ref 8.9–10.3)
Chloride: 106 mmol/L (ref 98–111)
Creatinine, Ser: 1.88 mg/dL — ABNORMAL HIGH (ref 0.44–1.00)
GFR calc Af Amer: 32 mL/min — ABNORMAL LOW (ref >60)
GFR calc non Af Amer: 28 mL/min — ABNORMAL LOW (ref >60)
Glucose, Bld: 130 mg/dL — ABNORMAL HIGH (ref 70–99)
Potassium: 4.3 mmol/L (ref 3.5–5.1)
Sodium: 142 mmol/L (ref 135–145)

## 2020-07-13 LAB — TROPONIN I (HIGH SENSITIVITY)
Troponin I (High Sensitivity): 51 ng/L — ABNORMAL HIGH (ref <18)
Troponin I (High Sensitivity): 52 ng/L — ABNORMAL HIGH (ref <18)

## 2020-07-13 LAB — BRAIN NATRIURETIC PEPTIDE: B Natriuretic Peptide: 2699.9 pg/mL — ABNORMAL HIGH (ref 0.0–100.0)

## 2020-07-13 LAB — CBC
HCT: 38.8 % (ref 36.0–46.0)
Hemoglobin: 11.9 g/dL — ABNORMAL LOW (ref 12.0–15.0)
MCH: 30.4 pg (ref 26.0–34.0)
MCHC: 30.7 g/dL (ref 30.0–36.0)
MCV: 99.2 fL (ref 80.0–100.0)
Platelets: 341 10*3/uL (ref 150–400)
RBC: 3.91 MIL/uL (ref 3.87–5.11)
RDW: 18.1 % — ABNORMAL HIGH (ref 11.5–15.5)
WBC: 10.1 10*3/uL (ref 4.0–10.5)
nRBC: 0 % (ref 0.0–0.2)

## 2020-07-13 LAB — SARS CORONAVIRUS 2 BY RT PCR (HOSPITAL ORDER, PERFORMED IN ~~LOC~~ HOSPITAL LAB): SARS Coronavirus 2: NEGATIVE

## 2020-07-13 MED ORDER — PROMETHAZINE HCL 25 MG PO TABS
25.0000 mg | ORAL_TABLET | Freq: Four times a day (QID) | ORAL | 0 refills | Status: DC | PRN
Start: 2020-07-13 — End: 2020-11-06

## 2020-07-13 MED ORDER — ALLOPURINOL 100 MG PO TABS: 100.0000 mg | ORAL_TABLET | Freq: Every day | ORAL | 0 refills | Status: DC

## 2020-07-13 MED ORDER — FERROUS SULFATE 325 (65 FE) MG PO TABS: 325.0000 mg | ORAL_TABLET | Freq: Every day | ORAL | 0 refills | Status: AC

## 2020-07-13 MED ORDER — APIXABAN 2.5 MG PO TABS: 2.5000 mg | ORAL_TABLET | Freq: Two times a day (BID) | ORAL | 3 refills | Status: DC

## 2020-07-13 MED ORDER — NITROGLYCERIN 2 % TD OINT
1.0000 [in_us] | TOPICAL_OINTMENT | Freq: Four times a day (QID) | TRANSDERMAL | Status: DC
  Administered 2020-07-13: 1 [in_us] via TOPICAL
  Filled 2020-07-13: qty 1

## 2020-07-13 MED ORDER — DILTIAZEM HCL 25 MG/5ML IV SOLN
10.0000 mg | Freq: Once | INTRAVENOUS | Status: AC
  Administered 2020-07-13: 10 mg via INTRAVENOUS
  Filled 2020-07-13: qty 5

## 2020-07-13 MED ORDER — LORAZEPAM 2 MG/ML IJ SOLN
1.0000 mg | Freq: Once | INTRAMUSCULAR | Status: AC
  Administered 2020-07-13: 1 mg via INTRAVENOUS
  Filled 2020-07-13: qty 1

## 2020-07-13 MED ORDER — VITAMIN D 25 MCG (1000 UNIT) PO TABS: 1000.0000 [IU] | ORAL_TABLET | Freq: Every day | ORAL | 0 refills | Status: AC

## 2020-07-13 MED ORDER — SODIUM CHLORIDE 0.9% FLUSH
3.0000 mL | Freq: Once | INTRAVENOUS | Status: AC
  Administered 2020-07-13: 3 mL via INTRAVENOUS

## 2020-07-13 MED ORDER — FUROSEMIDE 10 MG/ML IJ SOLN
40.0000 mg | Freq: Once | INTRAMUSCULAR | Status: AC
  Administered 2020-07-13: 40 mg via INTRAVENOUS
  Filled 2020-07-13: qty 4

## 2020-07-13 NOTE — Discharge Instructions (Addendum)
Continue your current medications.  Follow-up with Dr. Doylene Canard when you are able to make sure you are improving.

## 2020-07-13 NOTE — ED Provider Notes (Addendum)
[facility] EMERGENCY DEPARTMENT Provider Note   CSN: [id number] Arrival date & time: 07/12/20  2353     History Chief Complaint  Patient presents with  . Shortness of Breath    Renee Griffin is a 64 y.o. female.  HPI   Pt started having trouble with shortness of breath again.  Patient states she has been in and out of the hospital over the last few months.  Patient was last admitted on June 28 for shortness of breath.  Patient states they never told her what was wrong.  She was supposed to follow-up with her primary care doctor Dr. Doylene Canard that he is away on vacation.  Patient states last night it started getting more severe again.  She was feeling short of breath.  She is not having any chest pain.  She denies having any fevers.  She did notice that her ankles were swollen again.  Patient states she is thirsty.  She also feels like she needs something for her nerves.   Past Medical History:  Diagnosis Date  . Depression   . Diabetes mellitus without complication ([place])   . Heart disease, congenital   . High cholesterol   . Hypertension   . Panic attack   . Stroke [facility])     Patient Active Problem List   Diagnosis Date Noted  . Acute exacerbation of chronic obstructive pulmonary disease (COPD) ([place]) 06/23/2020  . Atrial fibrillation with RVR ([place]) 05/23/2020  . Acute congestive heart failure ([place]) 05/17/2020  . NSTEMI (non-ST elevated myocardial infarction) ([place]) 02/07/2020  . Acute systolic heart failure ([place]) 01/19/2020  . Acute lower GI bleeding 12/16/2018  . Dehydration 04/13/2018  . Ovarian mass, left   . Abdominal pain 02/01/2018  . Essential hypertension 03/04/2016  . HLD (hyperlipidemia) 03/04/2016  . Type 2 diabetes mellitus with circulatory disorder ([place]) 03/04/2016  . Obesity 03/04/2016  . CVA (cerebral vascular accident) ([place]) 03/05/2015  . Bilateral low back pain without sciatica 03/05/2015  . Essential hypertension, benign 03/05/2015    . Tobacco use disorder 03/05/2015  . Hyperlipidemia 03/05/2015  . Hyperparathyroidism, primary ([place]) 02/13/2013  . Generalized ischemic cerebrovascular disease 02/06/2013  . Hyperreflexia 02/06/2013  . Abnormality of gait 02/06/2013  . Disturbance of skin sensation 02/06/2013  . CVA (cerebral infarction) 06/03/2012  . HTN (hypertension) 06/03/2012  . Dyslipidemia 06/03/2012  . Anxiety 06/03/2012    Past Surgical History:  Procedure Laterality Date  . CARDIAC SURGERY  2011   CABG  . COLONOSCOPY WITH PROPOFOL N/A 02/08/2018   Procedure: COLONOSCOPY WITH PROPOFOL;  Surgeon: Otis Brace, MD;  Location: [facility];  Service: Gastroenterology;  Laterality: N/A;  . COLONOSCOPY WITH PROPOFOL N/A 12/16/2018   Procedure: COLONOSCOPY WITH PROPOFOL;  Surgeon: Ronald Lobo, MD;  Location: [facility];  Service: Endoscopy;  Laterality: N/A;  . DILATION AND EVACUATION     Patient states she had an abortion 30 or 40 years ago  . ESOPHAGOGASTRODUODENOSCOPY (EGD) WITH PROPOFOL Left 02/05/2018   Procedure: ESOPHAGOGASTRODUODENOSCOPY (EGD) WITH PROPOFOL;  Surgeon: Otis Brace, MD;  Location: [facility];  Service: Gastroenterology;  Laterality: Left;  . IR ANGIOGRAM SELECTIVE EACH ADDITIONAL VESSEL  12/18/2018  . IR ANGIOGRAM SELECTIVE EACH ADDITIONAL VESSEL  12/18/2018  . IR ANGIOGRAM SELECTIVE EACH ADDITIONAL VESSEL  12/18/2018  . IR ANGIOGRAM SELECTIVE EACH ADDITIONAL VESSEL  12/18/2018  . IR ANGIOGRAM VISCERAL SELECTIVE  12/18/2018  . IR EMBO ART  VEN HEMORR LYMPH EXTRAV  INC GUIDE ROADMAPPING  12/18/2018  .  IR US GUIDE VASC ACCESS RIGHT  12/18/2018  . LEFT HEART CATH AND CORONARY ANGIOGRAPHY N/A 02/10/2020   Procedure: LEFT HEART CATH AND CORONARY ANGIOGRAPHY;  Surgeon: Dixie Dials, MD;  Location: [facility];  Service: Cardiovascular;  Laterality: N/A;  . WISDOM TOOTH EXTRACTION       OB History    Gravida  1   Para      Term      Preterm      AB  1    Living  0     SAB      TAB  1   Ectopic      Multiple      Live Births              Family History  Problem Relation Age of Onset  . Cancer Mother        uterian OR cervical  . Cancer Father        lung  . Leukemia Brother   . Cancer Sister        breast    Social History   Tobacco Use  . Smoking status: Current Every Day Smoker    Packs/day: 1.00    Years: 50.00    Pack years: 50.00    Types: Cigarettes  . Smokeless tobacco: Never Used  Vaping Use  . Vaping Use: Never used  Substance Use Topics  . Alcohol use: No    Alcohol/week: 0.0 standard drinks  . Drug use: No    Home Medications Prior to Admission medications   Medication Sig Start Date End Date Taking? Authorizing Provider  acetaminophen (TYLENOL) 325 MG tablet Take 2 tablets (650 mg total) by mouth every 4 (four) hours as needed for headache or mild pain. 01/22/20  Yes Dixie Dials, MD  albuterol (VENTOLIN HFA) 108 (90 Base) MCG/ACT inhaler Inhale 2 puffs into the lungs every 6 (six) hours as needed for wheezing or shortness of breath. 01/22/20  Yes Dixie Dials, MD  ALPRAZolam Duanne Moron) 0.5 MG tablet Take 1 tablet (0.5 mg total) by mouth 2 (two) times daily as needed for anxiety. 02/24/20  Yes Virgel Manifold, MD  apixaban (ELIQUIS) 2.5 MG TABS tablet Take 1 tablet (2.5 mg total) by mouth 2 (two) times daily. 01/22/20  Yes Dixie Dials, MD  aspirin EC 81 MG tablet Take 1 tablet (81 mg total) by mouth daily. 01/22/20 01/21/21 Yes Dixie Dials, MD  cholecalciferol (VITAMIN D3) 25 MCG (1000 UT) tablet Take 1,000 Units by mouth daily.   Yes [provider]  digoxin (LANOXIN) 0.125 MG tablet Take 1 tablet (0.125 mg total) by mouth every Monday, Wednesday, and Friday. 05/29/20  Yes Dixie Dials, MD  diltiazem (CARDIZEM CD) 120 MG 24 hr capsule Take 1 capsule (120 mg total) by mouth daily. 05/28/20  Yes Dixie Dials, MD  ferrous sulfate 325 (65 FE) MG tablet Take 1 tablet (325 mg total) by mouth daily with  breakfast. 02/15/20  Yes Dixie Dials, MD  gemfibrozil (LOPID) 600 MG tablet Take 600 mg by mouth 2 (two) times daily. 02/19/18  Yes [provider]  loperamide (IMODIUM) 2 MG capsule Take 1 capsule (2 mg total) by mouth 4 (four) times daily as needed for diarrhea or loose stools. Patient taking differently: Take 2 mg by mouth daily as needed for diarrhea or loose stools.  02/24/19  Yes Julianne Rice, MD  metoprolol tartrate (LOPRESSOR) 25 MG tablet Take 0.5 tablets (12.5 mg total) by mouth 2 (two) times  daily. 12/25/18  Yes Dixie Dials, MD  Multiple Vitamin (MULTIVITAMIN WITH MINERALS) TABS tablet Take 1 tablet by mouth daily.   Yes [provider]  nitroGLYCERIN (NITROSTAT) 0.4 MG SL tablet Place 1 tablet (0.4 mg total) under the tongue every 5 (five) minutes x 3 doses as needed for chest pain. 02/14/20  Yes Dixie Dials, MD  oxymetazoline (AFRIN) 0.05 % nasal spray Place 1 spray into both nostrils 2 (two) times daily.   Yes [provider]  pantoprazole (PROTONIX) 40 MG tablet Take 1 tablet (40 mg total) by mouth 2 (two) times daily. Patient taking differently: Take 40 mg by mouth 2 (two) times daily as needed (heartburn).  02/09/18  Yes Dixie Dials, MD  promethazine (PHENERGAN) 25 MG tablet Take 1 tablet (25 mg total) by mouth every 6 (six) hours as needed for nausea or vomiting. 02/24/20  Yes Virgel Manifold, MD  sertraline (ZOLOFT) 100 MG tablet Take 100 mg by mouth at bedtime.    Yes [provider]  tiZANidine (ZANAFLEX) 2 MG tablet Take 1 tablet (2 mg total) by mouth at bedtime as needed for muscle spasms. 12/25/18  Yes Dixie Dials, MD  torsemide (DEMADEX) 20 MG tablet Take 1 tablet (20 mg total) by mouth every Monday, Wednesday, and Friday. 06/24/20  Yes Dixie Dials, MD  valACYclovir (VALTREX) 500 MG tablet Take 500 mg by mouth daily.   Yes [provider]  ACCU-CHEK AVIVA PLUS test strip See admin instructions. 01/29/16   [provider]  ACCU-CHEK SOFTCLIX LANCETS lancets See admin instructions. 01/30/16   [provider]  allopurinol (ZYLOPRIM) 100 MG tablet Take 1 tablet (100 mg total) by mouth daily. 01/23/20   Dixie Dials, MD  Blood Glucose Monitoring Suppl (ACCU-CHEK AVIVA PLUS) w/Device KIT See admin instructions. 01/29/16   [provider]  potassium chloride (KLOR-CON) 10 MEQ tablet Take 10 mEq by mouth daily. Patient not taking: Reported on 07/13/2020    [provider]  prazosin (MINIPRESS) 1 MG capsule Take 1 capsule (1 mg total) by mouth 2 (two) times daily. Patient not taking: Reported on 07/13/2020 02/09/18   Dixie Dials, MD  predniSONE (DELTASONE) 10 MG tablet Take 1 tablet (10 mg total) by mouth daily before breakfast. After 5 days decrease dose to 5 mg.  (1/2 of 10) for 10 days. Patient not taking: Reported on 07/13/2020 06/24/20   Dixie Dials, MD    Allergies    Amoxicillin-pot clavulanate, Bupropion, and Nicotine  Review of Systems   Review of Systems  All other systems reviewed and are negative.   Physical Exam Updated Vital Signs BP (!) 158/113   Pulse (!) 106   Temp 97.8 F (36.6 C) (Oral)   Resp (!) 22   Ht 1.6 m (_0 )   Wt 67.6 kg   SpO2 95%   BMI 26.40 kg/m   Physical Exam Vitals and nursing note reviewed.  Constitutional:      Appearance: She is well-developed.     Comments: Anxious  HENT:     Head: Normocephalic and atraumatic.     Right Ear: External ear normal.     Left Ear: External ear normal.  Eyes:     General: No scleral icterus.       Right eye: No discharge.        Left eye: No discharge.     Conjunctiva/sclera: Conjunctivae normal.  Neck:     Trachea: No tracheal deviation.  Cardiovascular:     Rate  and Rhythm: Tachycardia present. Rhythm irregular.  Pulmonary:     Effort: Pulmonary effort is normal. No respiratory distress.     Breath sounds: Normal breath sounds. No stridor. No wheezing or rales.  Abdominal:     General: Bowel  sounds are normal. There is no distension.     Palpations: Abdomen is soft.     Tenderness: There is no abdominal tenderness. There is no guarding or rebound.  Musculoskeletal:        General: No tenderness.     Cervical back: Neck supple.     Right lower leg: Edema present.     Left lower leg: Edema present.     Comments: Trace edema in the ankles  Skin:    General: Skin is warm and dry.     Findings: No rash.  Neurological:     Mental Status: She is alert.     Cranial Nerves: No cranial nerve deficit (no facial droop, extraocular movements intact, no slurred speech).     Sensory: No sensory deficit.     Motor: No abnormal muscle tone or seizure activity.     Coordination: Coordination normal.     ED Results / Procedures / Treatments   Labs (all labs ordered are listed, but only abnormal results are displayed) Labs Reviewed  BASIC METABOLIC PANEL - Abnormal; Notable for the following components:      Result Value   CO2 21 (*)    Glucose, Bld 130 (*)    BUN 31 (*)    Creatinine, Ser 1.88 (*)    GFR calc non Af Amer 28 (*)    GFR calc Af Amer 32 (*)    All other components within normal limits  CBC - Abnormal; Notable for the following components:   Hemoglobin 11.9 (*)    RDW 18.1 (*)    All other components within normal limits  BRAIN NATRIURETIC PEPTIDE - Abnormal; Notable for the following components:   B Natriuretic Peptide 2,699.9 (*)    All other components within normal limits  TROPONIN I (HIGH SENSITIVITY) - Abnormal; Notable for the following components:   Troponin I (High Sensitivity) 51 (*)    All other components within normal limits  TROPONIN I (HIGH SENSITIVITY) - Abnormal; Notable for the following components:   Troponin I (High Sensitivity) 52 (*)    All other components within normal limits  SARS CORONAVIRUS 2 BY RT PCR [facility] ORDER, [place] [facility])    EKG EKG Interpretation  Date/Time:  Monday July 13 2020 10:48:17  EDT Ventricular Rate:  116 PR Interval:    QRS Duration: 85 QT Interval:  338 QTC Calculation: 470 R Axis:   11 Text Interpretation: Atrial fibrillation Anterior infarct, old Nonspecific repol abnormality, lateral leads Since last tracing rate faster Confirmed by Dorie Rank [phone]) on 07/13/2020 10:51:55 AM   Radiology DG Chest Portable 1 View  Result Date: 07/13/2020 CLINICAL DATA:  Pt c/o SOB x 3-4 months progressively worsening. Pt states she feels Weak and lightheaded Hx of HTN, stroke Diabetic, smoker EXAM: PORTABLE CHEST 1 VIEW COMPARISON:  Chest radiograph 06/22/2020 FINDINGS: Stable cardiomediastinal contours status post median sternotomy. Enlarged heart size. Mild bilateral diffuse interstitial opacities likely trace edema. No pneumothorax or large pleural effusion. No acute finding in the visualized skeleton. IMPRESSION: Mild bilateral diffuse interstitial opacities likely representing trace edema. Electronically Signed   By: Audie Pinto M.D.   On: 07/13/2020 13:17    Procedures Procedures (including critical care  time)  Medications Ordered in ED Medications  nitroGLYCERIN (NITROGLYN) 2 % ointment 1 inch (1 inch Topical Given 07/13/20 1157)  sodium chloride flush (NS) 0.9 % injection 3 mL (3 mLs Intravenous Given 07/13/20 1158)  furosemide (LASIX) injection 40 mg (40 mg Intravenous Given 07/13/20 1152)  LORazepam (ATIVAN) injection 1 mg (1 mg Intravenous Given 07/13/20 1157)  diltiazem (CARDIZEM) injection 10 mg (10 mg Intravenous Given 07/13/20 1154)    ED Course  I have reviewed the triage vital signs and the nursing notes.  Pertinent labs & imaging results that were available during my care of the patient were reviewed by me and considered in my medical decision making (see chart for details).  Clinical Course as of Jul 13 1401  Mon Jul 13, 2020  1014 Patient's oxygen saturation is normal at the bedside without any supplemental oxygen.  Patient is hypertensive.   Initial electrolyte panel is unremarkable.  Troponin is elevated but this is actually decreased from previous values.  Chest x-ray and BNP are pending.   [JK]  1225 BNP remains persistently elevated although decreasing from previous.   [JK]  1227 Serial troponins are stable   [JK]  1325 Chest x-ray shows trace edema   [JK]  1336 Patient rechecked.  She states she is feeling better.  Still is not require oxygen.  She has been urinating   [JK]  1359 COVID negative   [JK]    Clinical Course User Index [JK] Dorie Rank, MD   MDM Rules/Calculators/A&P                         Patient presented to the ED for evaluation of shortness of breath.  Patient has been in the hospital frequently over the last few months because of issues with her breathing.  Patient does have history of COPD, CHF and anxiety.  Patient's ED work-up suggests a component of her CHF but she is not requiring any oxygen.  Patient was placed on 2 L nasal cannula but more for comfort.  BNP is elevated but decreased from previous.  Troponin is also elevated but this is also decreased from previous.  Patient was given a diuretic.  She was also given Ativan for her anxiety.  Patient improved.  She was in the ED for a total of at least 12 hours.  At this point I think she has been monitored adequately and does not require inpatient treatment.  Recommend close follow-up with her cardiologist. Final Clinical Impression(s) / ED Diagnoses Final diagnoses:  Acute on chronic congestive heart failure, unspecified heart failure type [facility])    Rx / DC Orders ED Discharge Orders    None       Dorie Rank, MD 07/13/20 1402 Patient asked for refills of several of her medications including vitamin D3, Eliquis, allopurinol, ferrous sulfate and Phenergan.  I refilled those medications electronically.  Patient also requested a refill of her Xanax.  Explained the patient that this would need to come from her primary care doctor.  I did review [place] PDMP and she did get a 30-day prescription that she filled earlier this month.   Dorie Rank, MD 07/13/20 1450

## 2020-07-13 NOTE — ED Notes (Signed)
Pt refused xrays

## 2020-07-13 NOTE — ED Notes (Signed)
Na. 164 Cl > 130

## 2020-07-13 NOTE — ED Notes (Signed)
HELP PATIENT BACK IN BED OFF THE BEDSIDE TOILET PATIENT IS RESTING WITH CALL BELL IN REACH

## 2020-07-13 NOTE — ED Notes (Addendum)
Patient verbalizes understanding of discharge instructions. Opportunity for questioning and answers were provided. Armband removed by staff, pt discharged from ED to home. Pt would like home meds refilled, Dr. Tomi Bamberger completes this, explains to pt that some controlled home meds cannot be refilled. Pt told to f/u with PCP or return if worsening condition. Ride will not be here until after 5p, given meal and drink to have in lobby.

## 2020-07-13 NOTE — ED Notes (Signed)
Got patient on the bedside toilet patient is resting with call bell in reach

## 2020-07-13 NOTE — ED Notes (Addendum)
Pt is  Refusing vital signs, asking for water, I let pt know that were not allowed to give them anything.

## 2020-07-19 ENCOUNTER — Other Ambulatory Visit: Payer: Self-pay

## 2020-07-19 ENCOUNTER — Encounter (HOSPITAL_COMMUNITY): Payer: Self-pay | Admitting: Emergency Medicine

## 2020-07-19 ENCOUNTER — Emergency Department (HOSPITAL_COMMUNITY)
Admission: EM | Admit: 2020-07-19 | Discharge: 2020-07-19 | Disposition: A | Discharge status: Home / Self Care | Payer: Medicare Other | Attending: Emergency Medicine | Admitting: Emergency Medicine

## 2020-07-19 DIAGNOSIS — Z7901 Long term (current) use of anticoagulants: Secondary | ICD-10-CM | POA: Diagnosis not present

## 2020-07-19 DIAGNOSIS — E119 Type 2 diabetes mellitus without complications: Secondary | ICD-10-CM | POA: Insufficient documentation

## 2020-07-19 DIAGNOSIS — Z79899 Other long term (current) drug therapy: Secondary | ICD-10-CM | POA: Insufficient documentation

## 2020-07-19 DIAGNOSIS — Z7982 Long term (current) use of aspirin: Secondary | ICD-10-CM | POA: Diagnosis not present

## 2020-07-19 DIAGNOSIS — I10 Essential (primary) hypertension: Secondary | ICD-10-CM | POA: Diagnosis not present

## 2020-07-19 DIAGNOSIS — R531 Weakness: Secondary | ICD-10-CM

## 2020-07-19 DIAGNOSIS — F419 Anxiety disorder, unspecified: Secondary | ICD-10-CM | POA: Diagnosis not present

## 2020-07-19 DIAGNOSIS — J449 Chronic obstructive pulmonary disease, unspecified: Secondary | ICD-10-CM | POA: Insufficient documentation

## 2020-07-19 DIAGNOSIS — Z7984 Long term (current) use of oral hypoglycemic drugs: Secondary | ICD-10-CM | POA: Diagnosis not present

## 2020-07-19 DIAGNOSIS — F17210 Nicotine dependence, cigarettes, uncomplicated: Secondary | ICD-10-CM | POA: Insufficient documentation

## 2020-07-19 LAB — URINALYSIS, ROUTINE W REFLEX MICROSCOPIC
Bacteria, UA: NONE SEEN
Bilirubin Urine: NEGATIVE
Glucose, UA: NEGATIVE mg/dL
Hgb urine dipstick: NEGATIVE
Ketones, ur: NEGATIVE mg/dL
Nitrite: NEGATIVE
Protein, ur: 100 mg/dL — AB
Specific Gravity, Urine: 1.018 (ref 1.005–1.030)
pH: 5 (ref 5.0–8.0)

## 2020-07-19 LAB — BASIC METABOLIC PANEL
Anion gap: 14 (ref 5–15)
BUN: 40 mg/dL — ABNORMAL HIGH (ref 8–23)
CO2: 20 mmol/L — ABNORMAL LOW (ref 22–32)
Calcium: 9.1 mg/dL (ref 8.9–10.3)
Chloride: 105 mmol/L (ref 98–111)
Creatinine, Ser: 2.07 mg/dL — ABNORMAL HIGH (ref 0.44–1.00)
GFR calc Af Amer: 29 mL/min — ABNORMAL LOW (ref >60)
GFR calc non Af Amer: 25 mL/min — ABNORMAL LOW (ref >60)
Glucose, Bld: 106 mg/dL — ABNORMAL HIGH (ref 70–99)
Potassium: 4 mmol/L (ref 3.5–5.1)
Sodium: 139 mmol/L (ref 135–145)

## 2020-07-19 LAB — CBC
HCT: 37 % (ref 36.0–46.0)
Hemoglobin: 11 g/dL — ABNORMAL LOW (ref 12.0–15.0)
MCH: 29.6 pg (ref 26.0–34.0)
MCHC: 29.7 g/dL — ABNORMAL LOW (ref 30.0–36.0)
MCV: 99.7 fL (ref 80.0–100.0)
Platelets: 295 10*3/uL (ref 150–400)
RBC: 3.71 MIL/uL — ABNORMAL LOW (ref 3.87–5.11)
RDW: 18.7 % — ABNORMAL HIGH (ref 11.5–15.5)
WBC: 8 10*3/uL (ref 4.0–10.5)
nRBC: 0 % (ref 0.0–0.2)

## 2020-07-19 MED ORDER — ALPRAZOLAM 0.25 MG PO TABS
0.5000 mg | ORAL_TABLET | Freq: Once | ORAL | Status: AC
  Administered 2020-07-19: 0.5 mg via ORAL
  Filled 2020-07-19: qty 2

## 2020-07-19 MED ORDER — SODIUM CHLORIDE 0.9% FLUSH: 3.0000 mL | Freq: Once | INTRAVENOUS | Status: DC

## 2020-07-19 NOTE — Discharge Instructions (Addendum)
Please return for any problem.  Follow-up with your regular care provider tomorrow.  If you need a refill of your Xanax, please contact your regular provider.

## 2020-07-19 NOTE — ED Triage Notes (Addendum)
Pt to triage via [facility] from home.  Reports bilateral leg weakness, tingling, and numbness x 2 days. That starts in bilateral thighs and radiates to feet.  Reports dizziness that started this morning.  No arm drift.  Also reports anxiety and SOB today.  CBG 115.  18g R FA.

## 2020-07-19 NOTE — ED Notes (Signed)
PT is requesting xanax. Says she ran out of her home med EDP notofied

## 2020-07-19 NOTE — ED Notes (Signed)
Patient states she "always use a cab, that y'all pay for" to go home or she has to wait for her brother coming from [place] to taker her home  Taxi voucher filled out, Taxi called  D/c reviewed  PT taking to the taxi by wheelchair

## 2020-07-19 NOTE — ED Notes (Signed)
Patient verbalizes understanding of discharge instructions. Opportunity for questioning and answers were provided. Armband removed by staff, pt discharged from ED.  

## 2020-07-19 NOTE — ED Provider Notes (Signed)
[facility] EMERGENCY DEPARTMENT Provider Note   CSN: [id number] Arrival date & time: 07/19/20  3491     History Chief Complaint  Patient presents with  . Weakness    Renee Griffin is a 64 y.o. female.  64 year old female with prior medical history as detailed below presents for evaluation of generalized weakness, fatigue, and feeling anxious.  Patient reports that she ran out of her Xanax earlier this week.  Her last dose was 4 days prior.  She feels particularly anxious without any need for Xanax to take.  She denies chest pain or shortness of breath.  She denies fever.  The history is provided by the patient.  Weakness Severity:  Mild Onset quality:  Gradual Timing:  Intermittent Progression:  Waxing and waning Chronicity:  New Worsened by:  Nothing Ineffective treatments:  None tried      Past Medical History:  Diagnosis Date  . Depression   . Diabetes mellitus without complication ([place])   . Heart disease, congenital   . High cholesterol   . Hypertension   . Panic attack   . Stroke [facility])     Patient Active Problem List   Diagnosis Date Noted  . Acute exacerbation of chronic obstructive pulmonary disease (COPD) ([place]) 06/23/2020  . Atrial fibrillation with RVR ([place]) 05/23/2020  . Acute congestive heart failure ([place]) 05/17/2020  . NSTEMI (non-ST elevated myocardial infarction) ([place]) 02/07/2020  . Acute systolic heart failure ([place]) 01/19/2020  . Acute lower GI bleeding 12/16/2018  . Dehydration 04/13/2018  . Ovarian mass, left   . Abdominal pain 02/01/2018  . Essential hypertension 03/04/2016  . HLD (hyperlipidemia) 03/04/2016  . Type 2 diabetes mellitus with circulatory disorder ([place]) 03/04/2016  . Obesity 03/04/2016  . CVA (cerebral vascular accident) ([place]) 03/05/2015  . Bilateral low back pain without sciatica 03/05/2015  . Essential hypertension, benign 03/05/2015  . Tobacco use disorder 03/05/2015  . Hyperlipidemia 03/05/2015  .  Hyperparathyroidism, primary ([place]) 02/13/2013  . Generalized ischemic cerebrovascular disease 02/06/2013  . Hyperreflexia 02/06/2013  . Abnormality of gait 02/06/2013  . Disturbance of skin sensation 02/06/2013  . CVA (cerebral infarction) 06/03/2012  . HTN (hypertension) 06/03/2012  . Dyslipidemia 06/03/2012  . Anxiety 06/03/2012    Past Surgical History:  Procedure Laterality Date  . CARDIAC SURGERY  2011   CABG  . COLONOSCOPY WITH PROPOFOL N/A 02/08/2018   Procedure: COLONOSCOPY WITH PROPOFOL;  Surgeon: Otis Brace, MD;  Location: [facility] ENDOSCOPY;  Service: Gastroenterology;  Laterality: N/A;  . COLONOSCOPY WITH PROPOFOL N/A 12/16/2018   Procedure: COLONOSCOPY WITH PROPOFOL;  Surgeon: Ronald Lobo, MD;  Location: [facility];  Service: Endoscopy;  Laterality: N/A;  . DILATION AND EVACUATION     Patient states she had an abortion 30 or 40 years ago  . ESOPHAGOGASTRODUODENOSCOPY (EGD) WITH PROPOFOL Left 02/05/2018   Procedure: ESOPHAGOGASTRODUODENOSCOPY (EGD) WITH PROPOFOL;  Surgeon: Otis Brace, MD;  Location: [facility]OSCOPY;  Service: Gastroenterology;  Laterality: Left;  . IR ANGIOGRAM SELECTIVE EACH ADDITIONAL VESSEL  12/18/2018  . IR ANGIOGRAM SELECTIVE EACH ADDITIONAL VESSEL  12/18/2018  . IR ANGIOGRAM SELECTIVE EACH ADDITIONAL VESSEL  12/18/2018  . IR ANGIOGRAM SELECTIVE EACH ADDITIONAL VESSEL  12/18/2018  . IR ANGIOGRAM VISCERAL SELECTIVE  12/18/2018  . IR EMBO ART  VEN HEMORR LYMPH EXTRAV  INC GUIDE ROADMAPPING  12/18/2018  . IR US GUIDE VASC ACCESS RIGHT  12/18/2018  . LEFT HEART CATH AND CORONARY ANGIOGRAPHY N/A 02/10/2020   Procedure: LEFT HEART CATH AND CORONARY ANGIOGRAPHY;  Surgeon: Dixie Dials, MD;  Location: [facility];  Service: Cardiovascular;  Laterality: N/A;  . WISDOM TOOTH EXTRACTION       OB History    Gravida  1   Para      Term      Preterm      AB  1   Living  0     SAB      TAB  1   Ectopic      Multiple      Live  Births              Family History  Problem Relation Age of Onset  . Cancer Mother        uterian OR cervical  . Cancer Father        lung  . Leukemia Brother   . Cancer Sister        breast    Social History   Tobacco Use  . Smoking status: Current Every Day Smoker    Packs/day: 1.00    Years: 50.00    Pack years: 50.00    Types: Cigarettes  . Smokeless tobacco: Never Used  Vaping Use  . Vaping Use: Never used  Substance Use Topics  . Alcohol use: No    Alcohol/week: 0.0 standard drinks  . Drug use: No    Home Medications Prior to Admission medications   Medication Sig Start Date End Date Taking? Authorizing Provider  ACCU-CHEK AVIVA PLUS test strip See admin instructions. 01/29/16   [provider]  ACCU-CHEK SOFTCLIX LANCETS lancets See admin instructions. 01/30/16   [provider]  acetaminophen (TYLENOL) 325 MG tablet Take 2 tablets (650 mg total) by mouth every 4 (four) hours as needed for headache or mild pain. 01/22/20   Dixie Dials, MD  albuterol (VENTOLIN HFA) 108 (90 Base) MCG/ACT inhaler Inhale 2 puffs into the lungs every 6 (six) hours as needed for wheezing or shortness of breath. 01/22/20   Dixie Dials, MD  allopurinol (ZYLOPRIM) 100 MG tablet Take 1 tablet (100 mg total) by mouth daily. 07/13/20   Dorie Rank, MD  ALPRAZolam Duanne Moron) 0.5 MG tablet Take 1 tablet (0.5 mg total) by mouth 2 (two) times daily as needed for anxiety. 02/24/20   Virgel Manifold, MD  apixaban (ELIQUIS) 2.5 MG TABS tablet Take 1 tablet (2.5 mg total) by mouth 2 (two) times daily. 07/13/20   Dorie Rank, MD  aspirin EC 81 MG tablet Take 1 tablet (81 mg total) by mouth daily. 01/22/20 01/21/21  Dixie Dials, MD  Blood Glucose Monitoring Suppl (ACCU-CHEK AVIVA PLUS) w/Device KIT See admin instructions. 01/29/16   [provider]  cholecalciferol (VITAMIN D3) 25 MCG (1000 UNIT) tablet Take 1 tablet (1,000 Units total) by mouth daily. 07/13/20   Dorie Rank, MD  digoxin  Fonnie Birkenhead) 0.125 MG tablet Take 1 tablet (0.125 mg total) by mouth every Monday, Wednesday, and Friday. 05/29/20   Dixie Dials, MD  diltiazem (CARDIZEM CD) 120 MG 24 hr capsule Take 1 capsule (120 mg total) by mouth daily. 05/28/20   Dixie Dials, MD  ferrous sulfate 325 (65 FE) MG tablet Take 1 tablet (325 mg total) by mouth daily with breakfast. 07/13/20   Dorie Rank, MD  gemfibrozil (LOPID) 600 MG tablet Take 600 mg by mouth 2 (two) times daily. 02/19/18   [provider]  loperamide (IMODIUM) 2 MG capsule Take 1 capsule (2 mg total) by mouth 4 (four) times daily as needed for diarrhea  or loose stools. Patient taking differently: Take 2 mg by mouth daily as needed for diarrhea or loose stools.  02/24/19   Julianne Rice, MD  metoprolol tartrate (LOPRESSOR) 25 MG tablet Take 0.5 tablets (12.5 mg total) by mouth 2 (two) times daily. 12/25/18   Dixie Dials, MD  Multiple Vitamin (MULTIVITAMIN WITH MINERALS) TABS tablet Take 1 tablet by mouth daily.    [provider]  nitroGLYCERIN (NITROSTAT) 0.4 MG SL tablet Place 1 tablet (0.4 mg total) under the tongue every 5 (five) minutes x 3 doses as needed for chest pain. 02/14/20   Dixie Dials, MD  oxymetazoline (AFRIN) 0.05 % nasal spray Place 1 spray into both nostrils 2 (two) times daily.    [provider]  pantoprazole (PROTONIX) 40 MG tablet Take 1 tablet (40 mg total) by mouth 2 (two) times daily. Patient taking differently: Take 40 mg by mouth 2 (two) times daily as needed (heartburn).  02/09/18   Dixie Dials, MD  potassium chloride (KLOR-CON) 10 MEQ tablet Take 10 mEq by mouth daily. Patient not taking: Reported on 07/13/2020    [provider]  prazosin (MINIPRESS) 1 MG capsule Take 1 capsule (1 mg total) by mouth 2 (two) times daily. Patient not taking: Reported on 07/13/2020 02/09/18   Dixie Dials, MD  predniSONE (DELTASONE) 10 MG tablet Take 1 tablet (10 mg total) by mouth daily before breakfast. After 5 days  decrease dose to 5 mg.  (1/2 of 10) for 10 days. Patient not taking: Reported on 07/13/2020 06/24/20   Dixie Dials, MD  promethazine (PHENERGAN) 25 MG tablet Take 1 tablet (25 mg total) by mouth every 6 (six) hours as needed for nausea or vomiting. 07/13/20   Dorie Rank, MD  sertraline (ZOLOFT) 100 MG tablet Take 100 mg by mouth at bedtime.     [provider]  tiZANidine (ZANAFLEX) 2 MG tablet Take 1 tablet (2 mg total) by mouth at bedtime as needed for muscle spasms. 12/25/18   Dixie Dials, MD  torsemide (DEMADEX) 20 MG tablet Take 1 tablet (20 mg total) by mouth every Monday, Wednesday, and Friday. 06/24/20   Dixie Dials, MD  valACYclovir (VALTREX) 500 MG tablet Take 500 mg by mouth daily.    [provider]    Allergies    Amoxicillin-pot clavulanate, Bupropion, and Nicotine  Review of Systems   Review of Systems  Neurological: Positive for weakness.  All other systems reviewed and are negative.   Physical Exam Updated Vital Signs BP (!) 152/96   Pulse 92   Temp 97.8 F (36.6 C) (Oral)   Resp 23   Ht _0  (1.6 m)   Wt 72.6 kg   SpO2 100%   BMI 28.34 kg/m   Physical Exam Vitals and nursing note reviewed.  Constitutional:      General: She is not in acute distress.    Appearance: She is well-developed.  HENT:     Head: Normocephalic and atraumatic.  Eyes:     Conjunctiva/sclera: Conjunctivae normal.     Pupils: Pupils are equal, round, and reactive to light.  Cardiovascular:     Rate and Rhythm: Normal rate and regular rhythm.     Heart sounds: Normal heart sounds.  Pulmonary:     Effort: Pulmonary effort is normal. No respiratory distress.     Breath sounds: Normal breath sounds.  Abdominal:     General: There is no distension.     Palpations: Abdomen is soft.  Tenderness: There is no abdominal tenderness.  Musculoskeletal:        General: No deformity. Normal range of motion.     Cervical back: Normal range of motion and neck supple.    Skin:    General: Skin is warm and dry.  Neurological:     General: No focal deficit present.     Mental Status: She is alert and oriented to person, place, and time.     ED Results / Procedures / Treatments   Labs (all labs ordered are listed, but only abnormal results are displayed) Labs Reviewed  BASIC METABOLIC PANEL - Abnormal; Notable for the following components:      Result Value   CO2 20 (*)    Glucose, Bld 106 (*)    BUN 40 (*)    Creatinine, Ser 2.07 (*)    GFR calc non Af Amer 25 (*)    GFR calc Af Amer 29 (*)    All other components within normal limits  CBC - Abnormal; Notable for the following components:   RBC 3.71 (*)    Hemoglobin 11.0 (*)    MCHC 29.7 (*)    RDW 18.7 (*)    All other components within normal limits  URINALYSIS, ROUTINE W REFLEX MICROSCOPIC - Abnormal; Notable for the following components:   Protein, ur 100 (*)    Leukocytes,Ua SMALL (*)    All other components within normal limits    EKG EKG Interpretation  Date/Time:  Sunday July 19 2020 09:08:29 EDT Ventricular Rate:  83 PR Interval:    QRS Duration: 88 QT Interval:  372 QTC Calculation: 437 R Axis:   110 Text Interpretation: Atrial fibrillation Right axis deviation ST & T wave abnormality, consider inferior ischemia Abnormal ECG Confirmed by Dene Gentry [phone]) on 07/19/2020 11:24:51 AM   Radiology No results found.  Procedures Procedures (including critical care time)  Medications Ordered in ED Medications  sodium chloride flush (NS) 0.9 % injection 3 mL ( Intravenous Canceled Entry 07/19/20 1230)  ALPRAZolam (XANAX) tablet 0.5 mg (0.5 mg Oral Given 07/19/20 1255)    ED Course  I have reviewed the triage vital signs and the nursing notes.  Pertinent labs & imaging results that were available during my care of the patient were reviewed by me and considered in my medical decision making (see chart for details).    MDM Rules/Calculators/A&P                           MDM  Screen complete  DARTHA ROZZELL was evaluated in Emergency Department on 07/19/2020 for the symptoms described in the history of present illness. She was evaluated in the context of the global COVID-19 pandemic, which necessitated consideration that the patient might be at risk for infection with the SARS-CoV-2 virus that causes COVID-19. Institutional protocols and algorithms that pertain to the evaluation of patients at risk for COVID-19 are in a state of rapid change based on information released by regulatory bodies including the CDC and federal and state organizations. These policies and algorithms were followed during the patient's care in the ED.   Patient is presenting for evaluation of generalized weakness, fatigue, and anxiety.  Screening labs not reveal significant abnormality.  Further conversation with the patient revealed that a large majority of her symptoms today may be related to running out of Xanax approximately 1 week prior.  She was given 1 dose of Xanax today which has  helped her symptoms somewhat.  She did request refill of Xanax.  She is advised to Xanax is a controlled substance and that this must be refilled by her regular provider.  Importance of close follow-up is stressed.  Strict return precautions given and understood.  Final Clinical Impression(s) / ED Diagnoses Final diagnoses:  Weakness  Anxiety    Rx / DC Orders ED Discharge Orders    None       Valarie Merino, MD 07/19/20 1445

## 2020-08-24 ENCOUNTER — Other Ambulatory Visit: Payer: Self-pay

## 2020-08-24 ENCOUNTER — Emergency Department (HOSPITAL_COMMUNITY)
Admission: EM | Admit: 2020-08-24 | Discharge: 2020-08-24 | Discharge status: Against Medical Advice | Payer: Medicare Other | Attending: Emergency Medicine | Admitting: Emergency Medicine

## 2020-08-24 DIAGNOSIS — Z79899 Other long term (current) drug therapy: Secondary | ICD-10-CM | POA: Insufficient documentation

## 2020-08-24 DIAGNOSIS — I110 Hypertensive heart disease with heart failure: Secondary | ICD-10-CM | POA: Diagnosis not present

## 2020-08-24 DIAGNOSIS — J449 Chronic obstructive pulmonary disease, unspecified: Secondary | ICD-10-CM | POA: Insufficient documentation

## 2020-08-24 DIAGNOSIS — Z7982 Long term (current) use of aspirin: Secondary | ICD-10-CM | POA: Diagnosis not present

## 2020-08-24 DIAGNOSIS — E1159 Type 2 diabetes mellitus with other circulatory complications: Secondary | ICD-10-CM | POA: Insufficient documentation

## 2020-08-24 DIAGNOSIS — Z7951 Long term (current) use of inhaled steroids: Secondary | ICD-10-CM | POA: Insufficient documentation

## 2020-08-24 DIAGNOSIS — I5021 Acute systolic (congestive) heart failure: Secondary | ICD-10-CM | POA: Diagnosis not present

## 2020-08-24 DIAGNOSIS — R110 Nausea: Secondary | ICD-10-CM | POA: Insufficient documentation

## 2020-08-24 DIAGNOSIS — F17210 Nicotine dependence, cigarettes, uncomplicated: Secondary | ICD-10-CM | POA: Diagnosis not present

## 2020-08-24 LAB — COMPREHENSIVE METABOLIC PANEL
ALT: 8 U/L (ref 0–44)
AST: 14 U/L — ABNORMAL LOW (ref 15–41)
Albumin: 3.2 g/dL — ABNORMAL LOW (ref 3.5–5.0)
Alkaline Phosphatase: 127 U/L — ABNORMAL HIGH (ref 38–126)
Anion gap: 14 (ref 5–15)
BUN: 29 mg/dL — ABNORMAL HIGH (ref 8–23)
CO2: 18 mmol/L — ABNORMAL LOW (ref 22–32)
Calcium: 10.6 mg/dL — ABNORMAL HIGH (ref 8.9–10.3)
Chloride: 107 mmol/L (ref 98–111)
Creatinine, Ser: 1.78 mg/dL — ABNORMAL HIGH (ref 0.44–1.00)
GFR calc Af Amer: 34 mL/min — ABNORMAL LOW (ref >60)
GFR calc non Af Amer: 30 mL/min — ABNORMAL LOW (ref >60)
Glucose, Bld: 122 mg/dL — ABNORMAL HIGH (ref 70–99)
Potassium: 3.8 mmol/L (ref 3.5–5.1)
Sodium: 139 mmol/L (ref 135–145)
Total Bilirubin: 0.5 mg/dL (ref 0.3–1.2)
Total Protein: 6.7 g/dL (ref 6.5–8.1)

## 2020-08-24 LAB — URINALYSIS, ROUTINE W REFLEX MICROSCOPIC
Bilirubin Urine: NEGATIVE
Glucose, UA: NEGATIVE mg/dL
Hgb urine dipstick: NEGATIVE
Ketones, ur: NEGATIVE mg/dL
Nitrite: NEGATIVE
Protein, ur: 100 mg/dL — AB
Specific Gravity, Urine: 1.016 (ref 1.005–1.030)
pH: 5 (ref 5.0–8.0)

## 2020-08-24 LAB — CBC
HCT: 34.2 % — ABNORMAL LOW (ref 36.0–46.0)
Hemoglobin: 10.5 g/dL — ABNORMAL LOW (ref 12.0–15.0)
MCH: 29 pg (ref 26.0–34.0)
MCHC: 30.7 g/dL (ref 30.0–36.0)
MCV: 94.5 fL (ref 80.0–100.0)
Platelets: 391 10*3/uL (ref 150–400)
RBC: 3.62 MIL/uL — ABNORMAL LOW (ref 3.87–5.11)
RDW: 17.3 % — ABNORMAL HIGH (ref 11.5–15.5)
WBC: 10.5 10*3/uL (ref 4.0–10.5)
nRBC: 0 % (ref 0.0–0.2)

## 2020-08-24 LAB — LIPASE, BLOOD: Lipase: 18 U/L (ref 11–51)

## 2020-08-24 MED ORDER — PROCHLORPERAZINE EDISYLATE 10 MG/2ML IJ SOLN
10.0000 mg | Freq: Once | INTRAMUSCULAR | Status: AC
  Administered 2020-08-24: 10 mg via INTRAVENOUS
  Filled 2020-08-24: qty 2

## 2020-08-24 MED ORDER — DIPHENHYDRAMINE HCL 50 MG/ML IJ SOLN
25.0000 mg | Freq: Once | INTRAMUSCULAR | Status: AC
  Administered 2020-08-24: 25 mg via INTRAVENOUS
  Filled 2020-08-24: qty 1

## 2020-08-24 MED ORDER — ALPRAZOLAM 0.25 MG PO TABS
1.0000 mg | ORAL_TABLET | Freq: Once | ORAL | Status: DC
  Filled 2020-08-24: qty 4

## 2020-08-24 MED ORDER — SODIUM CHLORIDE 0.9 % IV BOLUS
500.0000 mL | Freq: Once | INTRAVENOUS | Status: AC
  Administered 2020-08-24: 500 mL via INTRAVENOUS

## 2020-08-24 NOTE — ED Provider Notes (Signed)
[facility] EMERGENCY DEPARTMENT Provider Note   CSN: [id number] Arrival date & time: 08/24/20  1219     History Chief Complaint  Patient presents with  . Nausea    Renee Griffin is a 64 y.o. female.  The history is provided by the patient.  Emesis Severity:  Mild Timing:  Intermittent Number of daily episodes:  Patient not having vomiting but intense nausea at times. Progression:  Unchanged Chronicity:  Recurrent Recent urination:  Normal Relieved by:  Nothing Worsened by:  Nothing Associated symptoms: no abdominal pain, no arthralgias, no chills, no cough, no diarrhea, no fever, no headaches, no myalgias and no sore throat   Risk factors: diabetes   Risk factors: no sick contacts        Past Medical History:  Diagnosis Date  . Depression   . Diabetes mellitus without complication ([place])   . Heart disease, congenital   . High cholesterol   . Hypertension   . Panic attack   . Stroke [facility])     Patient Active Problem List   Diagnosis Date Noted  . Acute exacerbation of chronic obstructive pulmonary disease (COPD) ([place]) 06/23/2020  . Atrial fibrillation with RVR ([place]) 05/23/2020  . Acute congestive heart failure ([place]) 05/17/2020  . NSTEMI (non-ST elevated myocardial infarction) ([place]) 02/07/2020  . Acute systolic heart failure ([place]) 01/19/2020  . Acute lower GI bleeding 12/16/2018  . Dehydration 04/13/2018  . Ovarian mass, left   . Abdominal pain 02/01/2018  . Essential hypertension 03/04/2016  . HLD (hyperlipidemia) 03/04/2016  . Type 2 diabetes mellitus with circulatory disorder ([place]) 03/04/2016  . Obesity 03/04/2016  . CVA (cerebral vascular accident) ([place]) 03/05/2015  . Bilateral low back pain without sciatica 03/05/2015  . Essential hypertension, benign 03/05/2015  . Tobacco use disorder 03/05/2015  . Hyperlipidemia 03/05/2015  . Hyperparathyroidism, primary ([place]) 02/13/2013  . Generalized ischemic cerebrovascular disease 02/06/2013    . Hyperreflexia 02/06/2013  . Abnormality of gait 02/06/2013  . Disturbance of skin sensation 02/06/2013  . CVA (cerebral infarction) 06/03/2012  . HTN (hypertension) 06/03/2012  . Dyslipidemia 06/03/2012  . Anxiety 06/03/2012    Past Surgical History:  Procedure Laterality Date  . CARDIAC SURGERY  2011   CABG  . COLONOSCOPY WITH PROPOFOL N/A 02/08/2018   Procedure: COLONOSCOPY WITH PROPOFOL;  Surgeon: Otis Brace, MD;  Location: [facility] ENDOSCOPY;  Service: Gastroenterology;  Laterality: N/A;  . COLONOSCOPY WITH PROPOFOL N/A 12/16/2018   Procedure: COLONOSCOPY WITH PROPOFOL;  Surgeon: Ronald Lobo, MD;  Location: [facility] ENDOSCOPY;  Service: Endoscopy;  Laterality: N/A;  . DILATION AND EVACUATION     Patient states she had an abortion 30 or 40 years ago  . ESOPHAGOGASTRODUODENOSCOPY (EGD) WITH PROPOFOL Left 02/05/2018   Procedure: ESOPHAGOGASTRODUODENOSCOPY (EGD) WITH PROPOFOL;  Surgeon: Otis Brace, MD;  Location: [facility] ENDOSCOPY;  Service: Gastroenterology;  Laterality: Left;  . IR ANGIOGRAM SELECTIVE EACH ADDITIONAL VESSEL  12/18/2018  . IR ANGIOGRAM SELECTIVE EACH ADDITIONAL VESSEL  12/18/2018  . IR ANGIOGRAM SELECTIVE EACH ADDITIONAL VESSEL  12/18/2018  . IR ANGIOGRAM SELECTIVE EACH ADDITIONAL VESSEL  12/18/2018  . IR ANGIOGRAM VISCERAL SELECTIVE  12/18/2018  . IR EMBO ART  VEN HEMORR LYMPH EXTRAV  INC GUIDE ROADMAPPING  12/18/2018  . IR US GUIDE VASC ACCESS RIGHT  12/18/2018  . LEFT HEART CATH AND CORONARY ANGIOGRAPHY N/A 02/10/2020   Procedure: LEFT HEART CATH AND CORONARY ANGIOGRAPHY;  Surgeon: Dixie Dials, MD;  Location: [place] CV LAB;  Service: Cardiovascular;  Laterality: N/A;  .  WISDOM TOOTH EXTRACTION       OB History    Gravida  1   Para      Term      Preterm      AB  1   Living  0     SAB      TAB  1   Ectopic      Multiple      Live Births              Family History  Problem Relation Age of Onset  . Cancer Mother         uterian OR cervical  . Cancer Father        lung  . Leukemia Brother   . Cancer Sister        breast    Social History   Tobacco Use  . Smoking status: Current Every Day Smoker    Packs/day: 1.00    Years: 50.00    Pack years: 50.00    Types: Cigarettes  . Smokeless tobacco: Never Used  Vaping Use  . Vaping Use: Never used  Substance Use Topics  . Alcohol use: No    Alcohol/week: 0.0 standard drinks  . Drug use: No    Home Medications Prior to Admission medications   Medication Sig Start Date End Date Taking? Authorizing Provider  ACCU-CHEK AVIVA PLUS test strip See admin instructions. 01/29/16   [provider]  ACCU-CHEK SOFTCLIX LANCETS lancets See admin instructions. 01/30/16   [provider]  acetaminophen (TYLENOL) 325 MG tablet Take 2 tablets (650 mg total) by mouth every 4 (four) hours as needed for headache or mild pain. 01/22/20   Dixie Dials, MD  albuterol (VENTOLIN HFA) 108 (90 Base) MCG/ACT inhaler Inhale 2 puffs into the lungs every 6 (six) hours as needed for wheezing or shortness of breath. 01/22/20   Dixie Dials, MD  allopurinol (ZYLOPRIM) 100 MG tablet Take 1 tablet (100 mg total) by mouth daily. 07/13/20   Dorie Rank, MD  ALPRAZolam Duanne Moron) 0.5 MG tablet Take 1 tablet (0.5 mg total) by mouth 2 (two) times daily as needed for anxiety. 02/24/20   Virgel Manifold, MD  apixaban (ELIQUIS) 2.5 MG TABS tablet Take 1 tablet (2.5 mg total) by mouth 2 (two) times daily. 07/13/20   Dorie Rank, MD  aspirin EC 81 MG tablet Take 1 tablet (81 mg total) by mouth daily. 01/22/20 01/21/21  Dixie Dials, MD  Blood Glucose Monitoring Suppl (ACCU-CHEK AVIVA PLUS) w/Device KIT See admin instructions. 01/29/16   [provider]  cholecalciferol (VITAMIN D3) 25 MCG (1000 UNIT) tablet Take 1 tablet (1,000 Units total) by mouth daily. 07/13/20   Dorie Rank, MD  digoxin Fonnie Birkenhead) 0.125 MG tablet Take 1 tablet (0.125 mg total) by mouth every Monday, Wednesday, and  Friday. 05/29/20   Dixie Dials, MD  diltiazem (CARDIZEM CD) 120 MG 24 hr capsule Take 1 capsule (120 mg total) by mouth daily. 05/28/20   Dixie Dials, MD  ferrous sulfate 325 (65 FE) MG tablet Take 1 tablet (325 mg total) by mouth daily with breakfast. 07/13/20   Dorie Rank, MD  gemfibrozil (LOPID) 600 MG tablet Take 600 mg by mouth 2 (two) times daily. 02/19/18   [provider]  loperamide (IMODIUM) 2 MG capsule Take 1 capsule (2 mg total) by mouth 4 (four) times daily as needed for diarrhea or loose stools. Patient taking differently: Take 2 mg by mouth daily as needed for diarrhea or loose  stools.  02/24/19   Julianne Rice, MD  metoprolol tartrate (LOPRESSOR) 25 MG tablet Take 0.5 tablets (12.5 mg total) by mouth 2 (two) times daily. 12/25/18   Dixie Dials, MD  Multiple Vitamin (MULTIVITAMIN WITH MINERALS) TABS tablet Take 1 tablet by mouth daily.    [provider]  nitroGLYCERIN (NITROSTAT) 0.4 MG SL tablet Place 1 tablet (0.4 mg total) under the tongue every 5 (five) minutes x 3 doses as needed for chest pain. 02/14/20   Dixie Dials, MD  oxymetazoline (AFRIN) 0.05 % nasal spray Place 1 spray into both nostrils 2 (two) times daily.    [provider]  pantoprazole (PROTONIX) 40 MG tablet Take 1 tablet (40 mg total) by mouth 2 (two) times daily. Patient taking differently: Take 40 mg by mouth 2 (two) times daily as needed (heartburn).  02/09/18   Dixie Dials, MD  potassium chloride (KLOR-CON) 10 MEQ tablet Take 10 mEq by mouth daily. Patient not taking: Reported on 07/13/2020    [provider]  prazosin (MINIPRESS) 1 MG capsule Take 1 capsule (1 mg total) by mouth 2 (two) times daily. Patient not taking: Reported on 07/13/2020 02/09/18   Dixie Dials, MD  predniSONE (DELTASONE) 10 MG tablet Take 1 tablet (10 mg total) by mouth daily before breakfast. After 5 days decrease dose to 5 mg.  (1/2 of 10) for 10 days. Patient not taking: Reported on 07/13/2020  06/24/20   Dixie Dials, MD  promethazine (PHENERGAN) 25 MG tablet Take 1 tablet (25 mg total) by mouth every 6 (six) hours as needed for nausea or vomiting. 07/13/20   Dorie Rank, MD  sertraline (ZOLOFT) 100 MG tablet Take 100 mg by mouth at bedtime.     [provider]  tiZANidine (ZANAFLEX) 2 MG tablet Take 1 tablet (2 mg total) by mouth at bedtime as needed for muscle spasms. 12/25/18   Dixie Dials, MD  torsemide (DEMADEX) 20 MG tablet Take 1 tablet (20 mg total) by mouth every Monday, Wednesday, and Friday. 06/24/20   Dixie Dials, MD  valACYclovir (VALTREX) 500 MG tablet Take 500 mg by mouth daily.    [provider]    Allergies    Amoxicillin-pot clavulanate, Bupropion, and Nicotine  Review of Systems   Review of Systems  Constitutional: Negative for chills and fever.  HENT: Negative for ear pain and sore throat.   Eyes: Negative for pain and visual disturbance.  Respiratory: Negative for cough and shortness of breath.   Cardiovascular: Negative for chest pain and palpitations.  Gastrointestinal: Positive for nausea. Negative for abdominal pain, diarrhea and vomiting.  Genitourinary: Negative for dysuria and hematuria.  Musculoskeletal: Negative for arthralgias, back pain and myalgias.  Skin: Negative for color change and rash.  Neurological: Negative for seizures, syncope and headaches.  All other systems reviewed and are negative.   Physical Exam Updated Vital Signs BP (!) 172/99 (BP Location: Left Arm)   Pulse 87   Temp 98 F (36.7 C) (Oral)   Resp (!) 22   Ht '5\' 4"'  (1.626 m)   SpO2 100%   BMI 27.46 kg/m   Physical Exam Vitals and nursing note reviewed.  Constitutional:      General: She is not in acute distress.    Appearance: She is well-developed. She is not ill-appearing.  HENT:     Head: Normocephalic and atraumatic.     Nose: Nose normal.     Mouth/Throat:     Mouth: Mucous membranes are moist.  Eyes:  Extraocular Movements:  Extraocular movements intact.     Conjunctiva/sclera: Conjunctivae normal.     Pupils: Pupils are equal, round, and reactive to light.  Cardiovascular:     Rate and Rhythm: Normal rate and regular rhythm.     Heart sounds: Normal heart sounds. No murmur heard.   Pulmonary:     Effort: Pulmonary effort is normal. No respiratory distress.     Breath sounds: Normal breath sounds.  Abdominal:     Palpations: Abdomen is soft.     Tenderness: There is no abdominal tenderness.  Musculoskeletal:     Cervical back: Normal range of motion and neck supple.  Skin:    General: Skin is warm and dry.     Capillary Refill: Capillary refill takes less than 2 seconds.  Neurological:     General: No focal deficit present.     Mental Status: She is alert.  Psychiatric:        Mood and Affect: Mood normal.     ED Results / Procedures / Treatments   Labs (all labs ordered are listed, but only abnormal results are displayed) Labs Reviewed  COMPREHENSIVE METABOLIC PANEL - Abnormal; Notable for the following components:      Result Value   CO2 18 (*)    Glucose, Bld 122 (*)    BUN 29 (*)    Creatinine, Ser 1.78 (*)    Calcium 10.6 (*)    Albumin 3.2 (*)    AST 14 (*)    Alkaline Phosphatase 127 (*)    GFR calc non Af Amer 30 (*)    GFR calc Af Amer 34 (*)    All other components within normal limits  CBC - Abnormal; Notable for the following components:   RBC 3.62 (*)    Hemoglobin 10.5 (*)    HCT 34.2 (*)    RDW 17.3 (*)    All other components within normal limits  LIPASE, BLOOD  URINALYSIS, ROUTINE W REFLEX MICROSCOPIC    EKG None  Radiology No results found.  Procedures Procedures (including critical care time)  Medications Ordered in ED Medications  ALPRAZolam (XANAX) tablet 1 mg (has no administration in time range)  sodium chloride 0.9 % bolus 500 mL (500 mLs Intravenous Bolus from Bag 08/24/20 1135)  prochlorperazine (COMPAZINE) injection 10 mg (10 mg Intravenous Given  08/24/20 1136)  diphenhydrAMINE (BENADRYL) injection 25 mg (25 mg Intravenous Given 08/24/20 1136)    ED Course  I have reviewed the triage vital signs and the nursing notes.  Pertinent labs & imaging results that were available during my care of the patient were reviewed by me and considered in my medical decision making (see chart for details).    MDM Rules/Calculators/A&P                          Renee Griffin is a 64 year old female with history of hypertension, depression, high cholesterol, diabetes, anxiety presents to the ED with nausea.  Patient with nausea for the last 3 to 4 days.  No abdominal pain, no chest pain, no shortness of breath.  History of the same.  Does not know if she has history of gastroparesis.  No abdominal tenderness on exam.  Overall she is well-appearing.  Lab work has already been done and shows no significant anemia, electrolyte abnormality, kidney injury.  Creatinine overall at baseline.  No significant leukocytosis or anemia.  Will give gentle IV fluid bolus and IV  Compazine and Benadryl.  Zofran has not worked at home.  No concern for bowel obstruction as she is not having any abdominal pain.  She is not having any diarrhea or constipation.  She has not vomited.  Patient eloped after requesting xanax and I told patient I would give her a dose but could not prescribe it long term. Left prior to getting xanax. Believe today's visit was for secondary gain.   This chart was dictated using voice recognition software.  Despite best efforts to proofread,  errors can occur which can change the documentation meaning.     Final Clinical Impression(s) / ED Diagnoses Final diagnoses:  Nausea    Rx / DC Orders ED Discharge Orders    None       Lennice Sites, DO 08/24/20 1209

## 2020-08-24 NOTE — ED Triage Notes (Signed)
To ED via [facility] - c/o nausea x 3-4 days- has been taking zofran, without relief.  No covid exposures,  Is VACCINATED

## 2020-08-24 NOTE — ED Notes (Signed)
Pt refusing EKG, vitals, and staff touching her. Pt wanting to leave. RN aware. IV being taken out.

## 2020-08-24 NOTE — ED Notes (Signed)
Pt refused to let NT get vitals or EKG. This RN stated the doctor placed an order for Xanax but we needed to get her vitals first. Pt stated nope that she was leaving and to get the IV out. This RN stated that was fine she could leave AMA. This RN removed IV and informed Dr. Ronnald Nian.

## 2020-10-26 ENCOUNTER — Inpatient Hospital Stay (HOSPITAL_COMMUNITY)
Admission: EM | Admit: 2020-10-26 | Discharge: 2020-11-06 | DRG: 314 | Disposition: A | Discharge status: Skilled Nursing Facility | Payer: Medicare Other | Attending: Internal Medicine | Admitting: Internal Medicine

## 2020-10-26 ENCOUNTER — Encounter (HOSPITAL_COMMUNITY): Payer: Self-pay

## 2020-10-26 ENCOUNTER — Emergency Department (HOSPITAL_COMMUNITY): Payer: Medicare Other

## 2020-10-26 DIAGNOSIS — Z66 Do not resuscitate: Secondary | ICD-10-CM | POA: Diagnosis not present

## 2020-10-26 DIAGNOSIS — F17210 Nicotine dependence, cigarettes, uncomplicated: Secondary | ICD-10-CM | POA: Diagnosis present

## 2020-10-26 DIAGNOSIS — I4821 Permanent atrial fibrillation: Secondary | ICD-10-CM | POA: Diagnosis present

## 2020-10-26 DIAGNOSIS — E785 Hyperlipidemia, unspecified: Secondary | ICD-10-CM | POA: Diagnosis present

## 2020-10-26 DIAGNOSIS — R2681 Unsteadiness on feet: Secondary | ICD-10-CM | POA: Diagnosis not present

## 2020-10-26 DIAGNOSIS — R58 Hemorrhage, not elsewhere classified: Principal | ICD-10-CM | POA: Diagnosis present

## 2020-10-26 DIAGNOSIS — Z953 Presence of xenogenic heart valve: Secondary | ICD-10-CM

## 2020-10-26 DIAGNOSIS — R578 Other shock: Secondary | ICD-10-CM | POA: Diagnosis not present

## 2020-10-26 DIAGNOSIS — Z888 Allergy status to other drugs, medicaments and biological substances status: Secondary | ICD-10-CM

## 2020-10-26 DIAGNOSIS — Z20822 Contact with and (suspected) exposure to covid-19: Secondary | ICD-10-CM | POA: Diagnosis present

## 2020-10-26 DIAGNOSIS — N170 Acute kidney failure with tubular necrosis: Secondary | ICD-10-CM | POA: Diagnosis present

## 2020-10-26 DIAGNOSIS — D3501 Benign neoplasm of right adrenal gland: Secondary | ICD-10-CM | POA: Diagnosis present

## 2020-10-26 DIAGNOSIS — Z881 Allergy status to other antibiotic agents status: Secondary | ICD-10-CM

## 2020-10-26 DIAGNOSIS — E1122 Type 2 diabetes mellitus with diabetic chronic kidney disease: Secondary | ICD-10-CM | POA: Diagnosis present

## 2020-10-26 DIAGNOSIS — E872 Acidosis: Secondary | ICD-10-CM | POA: Diagnosis not present

## 2020-10-26 DIAGNOSIS — R34 Anuria and oliguria: Secondary | ICD-10-CM | POA: Diagnosis not present

## 2020-10-26 DIAGNOSIS — Z803 Family history of malignant neoplasm of breast: Secondary | ICD-10-CM

## 2020-10-26 DIAGNOSIS — E7800 Pure hypercholesterolemia, unspecified: Secondary | ICD-10-CM | POA: Diagnosis present

## 2020-10-26 DIAGNOSIS — D62 Acute posthemorrhagic anemia: Secondary | ICD-10-CM | POA: Diagnosis present

## 2020-10-26 DIAGNOSIS — Z951 Presence of aortocoronary bypass graft: Secondary | ICD-10-CM

## 2020-10-26 DIAGNOSIS — I2510 Atherosclerotic heart disease of native coronary artery without angina pectoris: Secondary | ICD-10-CM | POA: Diagnosis present

## 2020-10-26 DIAGNOSIS — Z515 Encounter for palliative care: Secondary | ICD-10-CM | POA: Diagnosis not present

## 2020-10-26 DIAGNOSIS — D3502 Benign neoplasm of left adrenal gland: Secondary | ICD-10-CM | POA: Diagnosis present

## 2020-10-26 DIAGNOSIS — Z8673 Personal history of transient ischemic attack (TIA), and cerebral infarction without residual deficits: Secondary | ICD-10-CM

## 2020-10-26 DIAGNOSIS — N390 Urinary tract infection, site not specified: Secondary | ICD-10-CM | POA: Diagnosis not present

## 2020-10-26 DIAGNOSIS — Z79899 Other long term (current) drug therapy: Secondary | ICD-10-CM

## 2020-10-26 DIAGNOSIS — N184 Chronic kidney disease, stage 4 (severe): Secondary | ICD-10-CM | POA: Diagnosis present

## 2020-10-26 DIAGNOSIS — I130 Hypertensive heart and chronic kidney disease with heart failure and stage 1 through stage 4 chronic kidney disease, or unspecified chronic kidney disease: Secondary | ICD-10-CM | POA: Diagnosis present

## 2020-10-26 DIAGNOSIS — N838 Other noninflammatory disorders of ovary, fallopian tube and broad ligament: Secondary | ICD-10-CM | POA: Diagnosis not present

## 2020-10-26 DIAGNOSIS — I252 Old myocardial infarction: Secondary | ICD-10-CM

## 2020-10-26 DIAGNOSIS — N200 Calculus of kidney: Secondary | ICD-10-CM | POA: Diagnosis not present

## 2020-10-26 DIAGNOSIS — E278 Other specified disorders of adrenal gland: Secondary | ICD-10-CM | POA: Diagnosis present

## 2020-10-26 DIAGNOSIS — J449 Chronic obstructive pulmonary disease, unspecified: Secondary | ICD-10-CM | POA: Diagnosis present

## 2020-10-26 DIAGNOSIS — N179 Acute kidney failure, unspecified: Secondary | ICD-10-CM | POA: Diagnosis present

## 2020-10-26 DIAGNOSIS — I4891 Unspecified atrial fibrillation: Secondary | ICD-10-CM | POA: Diagnosis not present

## 2020-10-26 DIAGNOSIS — Z7901 Long term (current) use of anticoagulants: Secondary | ICD-10-CM

## 2020-10-26 DIAGNOSIS — I5042 Chronic combined systolic (congestive) and diastolic (congestive) heart failure: Secondary | ICD-10-CM | POA: Diagnosis present

## 2020-10-26 DIAGNOSIS — D631 Anemia in chronic kidney disease: Secondary | ICD-10-CM | POA: Diagnosis present

## 2020-10-26 DIAGNOSIS — R0602 Shortness of breath: Secondary | ICD-10-CM

## 2020-10-26 DIAGNOSIS — N83209 Unspecified ovarian cyst, unspecified side: Secondary | ICD-10-CM | POA: Diagnosis present

## 2020-10-26 DIAGNOSIS — Z7189 Other specified counseling: Secondary | ICD-10-CM

## 2020-10-26 DIAGNOSIS — Z7982 Long term (current) use of aspirin: Secondary | ICD-10-CM

## 2020-10-26 DIAGNOSIS — M549 Dorsalgia, unspecified: Secondary | ICD-10-CM | POA: Diagnosis present

## 2020-10-26 DIAGNOSIS — F419 Anxiety disorder, unspecified: Secondary | ICD-10-CM | POA: Diagnosis present

## 2020-10-26 DIAGNOSIS — Z806 Family history of leukemia: Secondary | ICD-10-CM

## 2020-10-26 DIAGNOSIS — I10 Essential (primary) hypertension: Secondary | ICD-10-CM | POA: Diagnosis present

## 2020-10-26 LAB — CBC
HCT: 38.9 % (ref 36.0–46.0)
HCT: 30.5 % — ABNORMAL LOW (ref 36.0–46.0)
Hemoglobin: 11.8 g/dL — ABNORMAL LOW (ref 12.0–15.0)
Hemoglobin: 9.3 g/dL — ABNORMAL LOW (ref 12.0–15.0)
MCH: 30 pg (ref 26.0–34.0)
MCH: 31.2 pg (ref 26.0–34.0)
MCHC: 30.3 g/dL (ref 30.0–36.0)
MCHC: 30.5 g/dL (ref 30.0–36.0)
MCV: 99 fL (ref 80.0–100.0)
MCV: 102.3 fL — ABNORMAL HIGH (ref 80.0–100.0)
Platelets: 346 10*3/uL (ref 150–400)
Platelets: 293 10*3/uL (ref 150–400)
RBC: 3.93 MIL/uL (ref 3.87–5.11)
RBC: 2.98 MIL/uL — ABNORMAL LOW (ref 3.87–5.11)
RDW: 18.2 % — ABNORMAL HIGH (ref 11.5–15.5)
RDW: 18.3 % — ABNORMAL HIGH (ref 11.5–15.5)
WBC: 13.4 10*3/uL — ABNORMAL HIGH (ref 4.0–10.5)
WBC: 17.7 10*3/uL — ABNORMAL HIGH (ref 4.0–10.5)
nRBC: 0 % (ref 0.0–0.2)
nRBC: 0 % (ref 0.0–0.2)

## 2020-10-26 LAB — URINALYSIS, ROUTINE W REFLEX MICROSCOPIC
Bacteria, UA: NONE SEEN
Bilirubin Urine: NEGATIVE
Glucose, UA: NEGATIVE mg/dL
Ketones, ur: NEGATIVE mg/dL
Nitrite: NEGATIVE
Protein, ur: 100 mg/dL — AB
Specific Gravity, Urine: 1.014 (ref 1.005–1.030)
pH: 5 (ref 5.0–8.0)

## 2020-10-26 LAB — I-STAT CHEM 8, ED
BUN: 41 mg/dL — ABNORMAL HIGH (ref 8–23)
Calcium, Ion: 1.24 mmol/L (ref 1.15–1.40)
Chloride: 109 mmol/L (ref 98–111)
Creatinine, Ser: 2 mg/dL — ABNORMAL HIGH (ref 0.44–1.00)
Glucose, Bld: 178 mg/dL — ABNORMAL HIGH (ref 70–99)
HCT: 30 % — ABNORMAL LOW (ref 36.0–46.0)
Hemoglobin: 10.2 g/dL — ABNORMAL LOW (ref 12.0–15.0)
Potassium: 3.7 mmol/L (ref 3.5–5.1)
Sodium: 143 mmol/L (ref 135–145)
TCO2: 22 mmol/L (ref 22–32)

## 2020-10-26 LAB — RESP PANEL BY RT PCR (RSV, FLU A&B, COVID)
Influenza A by PCR: NEGATIVE
Influenza B by PCR: NEGATIVE
Respiratory Syncytial Virus by PCR: NEGATIVE
SARS Coronavirus 2 by RT PCR: NEGATIVE

## 2020-10-26 LAB — COMPREHENSIVE METABOLIC PANEL
ALT: 9 U/L (ref 0–44)
AST: 18 U/L (ref 15–41)
Albumin: 3.4 g/dL — ABNORMAL LOW (ref 3.5–5.0)
Alkaline Phosphatase: 103 U/L (ref 38–126)
Anion gap: 16 — ABNORMAL HIGH (ref 5–15)
BUN: 30 mg/dL — ABNORMAL HIGH (ref 8–23)
CO2: 17 mmol/L — ABNORMAL LOW (ref 22–32)
Calcium: 10.2 mg/dL (ref 8.9–10.3)
Chloride: 108 mmol/L (ref 98–111)
Creatinine, Ser: 1.96 mg/dL — ABNORMAL HIGH (ref 0.44–1.00)
GFR, Estimated: 28 mL/min — ABNORMAL LOW (ref >60)
Glucose, Bld: 163 mg/dL — ABNORMAL HIGH (ref 70–99)
Potassium: 3.6 mmol/L (ref 3.5–5.1)
Sodium: 141 mmol/L (ref 135–145)
Total Bilirubin: 1 mg/dL (ref 0.3–1.2)
Total Protein: 6.4 g/dL — ABNORMAL LOW (ref 6.5–8.1)

## 2020-10-26 LAB — DIGOXIN LEVEL: Digoxin Level: 0.5 ng/mL — ABNORMAL LOW (ref 1.0–2.0)

## 2020-10-26 LAB — TROPONIN I (HIGH SENSITIVITY)
Troponin I (High Sensitivity): 46 ng/L — ABNORMAL HIGH (ref <18)
Troponin I (High Sensitivity): 86 ng/L — ABNORMAL HIGH (ref <18)

## 2020-10-26 LAB — LIPASE, BLOOD: Lipase: 22 U/L (ref 11–51)

## 2020-10-26 MED ORDER — PROTHROMBIN COMPLEX CONC HUMAN 500 UNITS IV KIT
3623.0000 [IU] | PACK | Status: AC
  Administered 2020-10-26: 3623 [IU] via INTRAVENOUS
  Filled 2020-10-26: qty 623

## 2020-10-26 MED ORDER — METOPROLOL TARTRATE 25 MG PO TABS: 12.5000 mg | ORAL_TABLET | Freq: Two times a day (BID) | ORAL | Status: DC

## 2020-10-26 MED ORDER — OXYCODONE HCL 5 MG PO TABS
5.0000 mg | ORAL_TABLET | Freq: Four times a day (QID) | ORAL | Status: DC | PRN
  Administered 2020-10-27 – 2020-11-04 (×13): 5 mg via ORAL
  Filled 2020-10-26 (×13): qty 1

## 2020-10-26 MED ORDER — SENNA 8.6 MG PO TABS
1.0000 | ORAL_TABLET | Freq: Two times a day (BID) | ORAL | Status: DC
  Administered 2020-10-26 – 2020-11-06 (×21): 8.6 mg via ORAL
  Filled 2020-10-26 (×21): qty 1

## 2020-10-26 MED ORDER — HYDROMORPHONE HCL 1 MG/ML IJ SOLN
1.0000 mg | INTRAMUSCULAR | Status: DC | PRN
  Administered 2020-10-26 – 2020-10-29 (×5): 1 mg via INTRAVENOUS
  Filled 2020-10-26 (×5): qty 1

## 2020-10-26 MED ORDER — FENTANYL CITRATE (PF) 100 MCG/2ML IJ SOLN
100.0000 ug | Freq: Once | INTRAMUSCULAR | Status: AC
  Administered 2020-10-26: 100 ug via INTRAVENOUS
  Filled 2020-10-26: qty 2

## 2020-10-26 MED ORDER — DIGOXIN 125 MCG PO TABS
0.1250 mg | ORAL_TABLET | ORAL | Status: DC
  Administered 2020-10-26 – 2020-11-04 (×5): 0.125 mg via ORAL
  Filled 2020-10-26 (×7): qty 1

## 2020-10-26 MED ORDER — SERTRALINE HCL 100 MG PO TABS
100.0000 mg | ORAL_TABLET | Freq: Every day | ORAL | Status: DC
  Administered 2020-10-27 – 2020-11-05 (×9): 100 mg via ORAL
  Filled 2020-10-26 (×9): qty 1

## 2020-10-26 MED ORDER — METOPROLOL TARTRATE 25 MG PO TABS
25.0000 mg | ORAL_TABLET | Freq: Two times a day (BID) | ORAL | Status: DC
  Administered 2020-10-27: 25 mg via ORAL
  Filled 2020-10-26: qty 1

## 2020-10-26 MED ORDER — SODIUM CHLORIDE 0.9 % IV BOLUS: 500.0000 mL | Freq: Once | INTRAVENOUS | Status: DC

## 2020-10-26 MED ORDER — ALPRAZOLAM 0.25 MG PO TABS
0.5000 mg | ORAL_TABLET | Freq: Once | ORAL | Status: AC
  Administered 2020-10-26: 0.5 mg via ORAL
  Filled 2020-10-26: qty 2

## 2020-10-26 MED ORDER — DILTIAZEM HCL-DEXTROSE 125-5 MG/125ML-% IV SOLN (PREMIX)
5.0000 mg/h | INTRAVENOUS | Status: DC
  Administered 2020-10-26: 5 mg/h via INTRAVENOUS
  Filled 2020-10-26: qty 125

## 2020-10-26 MED ORDER — ONDANSETRON HCL 4 MG/2ML IJ SOLN
4.0000 mg | Freq: Once | INTRAMUSCULAR | Status: AC
  Administered 2020-10-26: 4 mg via INTRAVENOUS
  Filled 2020-10-26: qty 2

## 2020-10-26 MED ORDER — TORSEMIDE 20 MG PO TABS
20.0000 mg | ORAL_TABLET | ORAL | Status: DC
  Administered 2020-10-26: 20 mg via ORAL
  Filled 2020-10-26 (×2): qty 1

## 2020-10-26 MED ORDER — GEMFIBROZIL 600 MG PO TABS
600.0000 mg | ORAL_TABLET | Freq: Two times a day (BID) | ORAL | Status: DC
  Administered 2020-10-26 – 2020-11-06 (×21): 600 mg via ORAL
  Filled 2020-10-26 (×23): qty 1

## 2020-10-26 MED ORDER — HYDROMORPHONE HCL 1 MG/ML IJ SOLN
1.0000 mg | INTRAMUSCULAR | Status: DC | PRN
  Administered 2020-10-26: 1 mg via INTRAVENOUS
  Filled 2020-10-26: qty 1

## 2020-10-26 MED ORDER — PROTHROMBIN COMPLEX CONC HUMAN 500 UNITS IV KIT: 50.0000 [IU]/kg | PACK | Status: DC

## 2020-10-26 NOTE — ED Notes (Signed)
Spoke to resident on call and was told to hold po metoprolol but keep Cardizem infusion running.

## 2020-10-26 NOTE — ED Notes (Signed)
General Surg paged to [place], [place] paged by Levada Dy

## 2020-10-26 NOTE — ED Notes (Signed)
Dinner tray ordered.

## 2020-10-26 NOTE — ED Triage Notes (Signed)
Pt from home with ems c.o left sided flank and upper back pain that started this morning while laying in bed. Denies any injury or trauma. Pt screaming in triage. Uncooperative. Hypertensive with ems 230/120 HR 88

## 2020-10-26 NOTE — H&P (Signed)
Date: 10/26/2020               Patient Name:  Renee Griffin MRN: [id number]  DOB: September 10, 1956 Age / Sex: 64 y.o., female   PCP: Dixie Dials, MD         Medical Service: Internal Medicine Teaching Service         Attending Physician: Dr. Aldine Contes, MD    First Contact: Dr. Cato Mulligan, MD Pager: [phone]  Second Contact: Dr. Mitzi Hansen, MD Pager: [phone]       After Hours (After 5p/  First Contact Pager: [phone]  weekends / holidays): Second Contact Pager: [phone]   Chief Complaint: Left flank pain  History of Present Illness:  [name] is a 64 year old female with past medical history of large left-sided adrenal mass, chronic systolic and diastolic heart failure, chronic atrial fibrillation (on Eliquis,) CAD s/p CABG, aortic stenosis s/p bioprosthetic aortic valve, prior CVA of left cerebellum and pons, HTN, HLD, COPD, CKD IV, anxiety, and current tobacco use disorder who presented to [facility] on 10/26/2020 for evaluation of left flank pain.  She states that this morning while at rest, she developed the sudden onset of a severe, aching pain in her left side which she rated as an 8/10. She reports the pain has been constant since onset and is worsened by movement and improved by nothing. She states that she has never had pain like this in the past. Other than this pain, she reports feeling in her normal state of health and denies associated chest pain, vomiting, diaphoresis, fevers, chills, abdominal pain. She does endorse nausea, shortness of breath, and diarrhea, however these complaints are chronic and have not recently worsened.  Of note, review of records reveal a 10.3cm solid left adrenal mass detected on CT Abdomen Pelvis on February 7th, 2019. This scan also revealed 7.5cm complex septated left ovarian cystic abnormality as well as 3.3cm right adrenal adenoma and 80m right hepatic lobe cyst. She denies having any prior knowledge of these imaging  findings prior to today's admission.  ED Course: On arrival to the ED, patient was hypertensive (207/158), but otherwise hemodynamically stable. Initial labs: CBC with WBC 13.4, Hb 11.8; CMP with BUN 30, Cr 1.96, CO2 17, Anion gap 16; troponin 46; lipase 22; UA moderate hemoglobin, 100 protein; COVID negative. CT Abdomen Pelvis revealed an interval enlargement of a known left adrenal mass with hemorrhage into the neoplasm and adjacent retroperitoneum concerning for adrenal cortical carcinoma or pheochromocytoma; ascites in the abdomen and pelvis; a 10 x 7cm cystic lesion in the pelvis; cystic areas in bilateral kidneys; small bilateral pleural effusions and cardiomegaly. CXR revealed mild vascular congestion. IR and General Surgery were both consulted who recommended medical management of patient's current condition with plan to re-evaluate if acutely worsens or does not respond appropriately. She received KCentra, 1057m fentanyl x2, and 0.45m245mlprazolam. She was admitted to IMTS for further evaluation and management of her condition.   Meds:  No current facility-administered medications on file prior to encounter.   Current Outpatient Medications on File Prior to Encounter  Medication Sig Dispense Refill  . ACCU-CHEK AVIVA PLUS test strip See admin instructions.  0  . ACCU-CHEK SOFTCLIX LANCETS lancets See admin instructions.  3  . acetaminophen (TYLENOL) 325 MG tablet Take 2 tablets (650 mg total) by mouth every 4 (four) hours as needed for headache or mild pain.    . aMarland Kitchenbuterol (VENTOLIN HFA) 108 (90 Base) MCG/ACT inhaler  Inhale 2 puffs into the lungs every 6 (six) hours as needed for wheezing or shortness of breath. 18 g 12  . allopurinol (ZYLOPRIM) 100 MG tablet Take 1 tablet (100 mg total) by mouth daily. 30 tablet 0  . ALPRAZolam (XANAX) 0.5 MG tablet Take 1 tablet (0.5 mg total) by mouth 2 (two) times daily as needed for anxiety. 6 tablet 0  . apixaban (ELIQUIS) 2.5 MG TABS tablet Take 1  tablet (2.5 mg total) by mouth 2 (two) times daily. 60 tablet 3  . aspirin EC 81 MG tablet Take 1 tablet (81 mg total) by mouth daily. 90 tablet 3  . Blood Glucose Monitoring Suppl (ACCU-CHEK AVIVA PLUS) w/Device KIT See admin instructions.  0  . cholecalciferol (VITAMIN D3) 25 MCG (1000 UNIT) tablet Take 1 tablet (1,000 Units total) by mouth daily. 30 tablet 0  . digoxin (LANOXIN) 0.125 MG tablet Take 1 tablet (0.125 mg total) by mouth every Monday, Wednesday, and Friday. 45 tablet 1  . diltiazem (CARDIZEM CD) 120 MG 24 hr capsule Take 1 capsule (120 mg total) by mouth daily. 30 capsule 2  . ferrous sulfate 325 (65 FE) MG tablet Take 1 tablet (325 mg total) by mouth daily with breakfast. 30 tablet 0  . gemfibrozil (LOPID) 600 MG tablet Take 600 mg by mouth 2 (two) times daily.  2  . loperamide (IMODIUM) 2 MG capsule Take 1 capsule (2 mg total) by mouth 4 (four) times daily as needed for diarrhea or loose stools. (Patient taking differently: Take 2 mg by mouth daily as needed for diarrhea or loose stools. ) 12 capsule 0  . metoprolol tartrate (LOPRESSOR) 25 MG tablet Take 0.5 tablets (12.5 mg total) by mouth 2 (two) times daily. 30 tablet 3  . Multiple Vitamin (MULTIVITAMIN WITH MINERALS) TABS tablet Take 1 tablet by mouth daily.    . nitroGLYCERIN (NITROSTAT) 0.4 MG SL tablet Place 1 tablet (0.4 mg total) under the tongue every 5 (five) minutes x 3 doses as needed for chest pain. 25 tablet 1  . oxymetazoline (AFRIN) 0.05 % nasal spray Place 1 spray into both nostrils 2 (two) times daily.    . pantoprazole (PROTONIX) 40 MG tablet Take 1 tablet (40 mg total) by mouth 2 (two) times daily. (Patient taking differently: Take 40 mg by mouth 2 (two) times daily as needed (heartburn). ) 60 tablet 6  . potassium chloride (KLOR-CON) 10 MEQ tablet Take 10 mEq by mouth daily. (Patient not taking: Reported on 07/13/2020)    . prazosin (MINIPRESS) 1 MG capsule Take 1 capsule (1 mg total) by mouth 2 (two) times  daily. (Patient not taking: Reported on 07/13/2020) 60 capsule 3  . predniSONE (DELTASONE) 10 MG tablet Take 1 tablet (10 mg total) by mouth daily before breakfast. After 5 days decrease dose to 5 mg.  (1/2 of 10) for 10 days. (Patient not taking: Reported on 07/13/2020) 10 tablet 0  . promethazine (PHENERGAN) 25 MG tablet Take 1 tablet (25 mg total) by mouth every 6 (six) hours as needed for nausea or vomiting. 12 tablet 0  . sertraline (ZOLOFT) 100 MG tablet Take 100 mg by mouth at bedtime.     Marland Kitchen tiZANidine (ZANAFLEX) 2 MG tablet Take 1 tablet (2 mg total) by mouth at bedtime as needed for muscle spasms. 30 tablet 0  . torsemide (DEMADEX) 20 MG tablet Take 1 tablet (20 mg total) by mouth every Monday, Wednesday, and Friday.    . valACYclovir (VALTREX) 500 MG  tablet Take 500 mg by mouth daily.     Allergies: Allergies as of 10/26/2020 - Review Complete 10/26/2020  Allergen Reaction Noted  . Amoxicillin-pot clavulanate Diarrhea 07/11/2014  . Bupropion Nausea Only 07/11/2014  . Nicotine Nausea Only and Rash 07/11/2014   Past Medical History:  Diagnosis Date  . Depression   . Diabetes mellitus without complication ([place])   . Heart disease, congenital   . High cholesterol   . Hypertension   . Panic attack   . Stroke [facility])    Family History:  Family History  Problem Relation Age of Onset  . Cancer Mother        uterian OR cervical  . Cancer Father        lung  . Leukemia Brother   . Cancer Sister        breast   Social History: Currently lives in [place] by herself. Reports current tobacco use of approximately 1 pack per day with 50-pack-year smoking history. Denies current alcohol or recreational drug use.   Review of Systems: A complete ROS was negative except as per HPI.  Physical Exam: Blood pressure (!) 157/103, pulse 96, temperature 97.6 F (36.4 C), temperature source Oral, resp. rate 14, height '5\' 3"'  (1.6 m), weight 72.9 kg, SpO2 94 %. Physical Exam Vitals and nursing  note reviewed.  Constitutional:      General: She is in acute distress.     Appearance: She is ill-appearing.  HENT:     Head: Normocephalic and atraumatic.  Eyes:     Extraocular Movements: Extraocular movements intact.     Conjunctiva/sclera: Conjunctivae normal.  Cardiovascular:     Rate and Rhythm: Tachycardia present. Rhythm irregular.  Pulmonary:     Effort: Pulmonary effort is normal.     Breath sounds: Normal breath sounds.  Abdominal:     General: There is distension.     Tenderness: There is no abdominal tenderness.     Hernia: A hernia is present.  Musculoskeletal:        General: Normal range of motion.     Cervical back: Normal range of motion and neck supple.     Right lower leg: No edema.     Left lower leg: No edema.  Skin:    General: Skin is warm and dry.     Findings: No rash.  Neurological:     General: No focal deficit present.     Mental Status: She is alert and oriented to person, place, and time.    CBC Latest Ref Rng & Units 10/26/2020 10/26/2020 08/24/2020  WBC 4.0 - 10.5 K/uL - 13.4(H) 10.5  Hemoglobin 12.0 - 15.0 g/dL 10.2(L) 11.8(L) 10.5(L)  Hematocrit 36 - 46 % 30.0(L) 38.9 34.2(L)  Platelets 150 - 400 K/uL - 346 391   CMP Latest Ref Rng & Units 10/26/2020 10/26/2020 08/24/2020  Glucose 70 - 99 mg/dL 178(H) 163(H) 122(H)  BUN 8 - 23 mg/dL 41(H) 30(H) 29(H)  Creatinine 0.44 - 1.00 mg/dL 2.00(H) 1.96(H) 1.78(H)  Sodium 135 - 145 mmol/L 143 141 139  Potassium 3.5 - 5.1 mmol/L 3.7 3.6 3.8  Chloride 98 - 111 mmol/L 109 108 107  CO2 22 - 32 mmol/L - 17(L) 18(L)  Calcium 8.9 - 10.3 mg/dL - 10.2 10.6(H)  Total Protein 6.5 - 8.1 g/dL - 6.4(L) 6.7  Total Bilirubin 0.3 - 1.2 mg/dL - 1.0 0.5  Alkaline Phos 38 - 126 U/L - 103 127(H)  AST 15 - 41 U/L - 18 14(L)  ALT 0 - 44 U/L - 9 8   EKG: personally reviewed my interpretation is atrial fibrillation with RVR.  CXR: personally reviewed my interpretation is mild vascular congestion.  CT ABDOMEN PELVIS  WO CONTRAST  Result Date: 10/26/2020 CLINICAL DATA:  Acute nonlocalized LEFT flank pain, LEFT-sided pain EXAM: CT ABDOMEN AND PELVIS WITHOUT CONTRAST TECHNIQUE: Multidetector CT imaging of the abdomen and pelvis was performed following the standard protocol without IV contrast. COMPARISON:  12/25/2018 FINDINGS: Lower chest: Mild septal thickening at the lung bases. No dense consolidation. No pleural effusion. Heart size is enlarged with signs of mitral annular and aortic valvular calcification. No pericardial effusion. Small pleural effusions in the chest. Trace on the LEFT and trace on the RIGHT slightly greater on the RIGHT than the LEFT. Hepatobiliary: Normal appearance of the liver on noncontrast imaging. No pericholecystic stranding. Pancreas: Pancreas splayed over a large LEFT adrenal mass, indistinct margins in the setting of periadrenal hemorrhage. Spleen: Stable low-density lesion in the spleen. Adrenals/Urinary Tract: Lobular low-attenuation lesion in the RIGHT adrenal gland is unchanged measuring approximately 2.4 x 2.4 cm. LEFT adrenal lesion with heterogeneity, calcification and enlargement since the previous imaging study 13 x 13 cm previously approximately 10 cm greatest axial dimension. This measures approximately 14 cm greatest craniocaudal dimension previously approximately 10 cm. Waves of variable density extend from the lesion which displaces the kidney inferiorly and bowel loops and pancreas anteriorly with much more mass effect upon the pancreas and LEFT upper quadrant structures than on the prior study. Cystic areas in the bilateral kidneys, some areas indeterminate based on density values, for instance LEFT kidney with a 1.7 x 1.6 cm area measuring 42 Hounsfield units (image 53, series 3) nephrolithiasis on the LEFT. This is stable compared to December of 2019. Slight enlargement of the largest area on the RIGHT that previously measured water density now with 45 Hounsfield unit density on  image 44 of series 3. Stable appearance of intermediate density lesion in the interpolar RIGHT kidney on image 42 of series 3 No nephrolithiasis on the RIGHT. 6 mm calculus in the interpolar LEFT kidney and vascular calcifications in the LEFT kidney as well. Urinary bladder under distended limiting assessment. Stomach/Bowel: Mass effect upon bowel loops in the upper abdomen. No signs of bowel obstruction or acute bowel process. Vascular/Lymphatic: Calcified atheromatous plaque of the abdominal aorta. No aneurysmal dilation. There is no gastrohepatic or hepatoduodenal ligament lymphadenopathy. No retroperitoneal or mesenteric lymphadenopathy. No pelvic sidewall lymphadenopathy. Reproductive: Cystic lesion in the pelvis measuring 10 x 7 cm when measured in a similar fashion approximately 8 x 6 cm. Other: Small volume ascites. Density between 10 and 20 Hounsfield units throughout the abdomen. Musculoskeletal: No acute bone finding. No destructive bone process. IMPRESSION: 1. Interval enlargement of the LEFT adrenal mass with heterogeneity, calcification with enlargement since the previous imaging study. This lesion displaces the kidney inferiorly and bowel loops and pancreas anteriorly with much more mass effect upon the pancreas and LEFT upper quadrant structures than on the prior study. Hemorrhage into existing adrenal neoplasm and into adjacent retroperitoneum, mixed density with areas of high density could be seen in the setting of ongoing bleeding. Given size and appearance adrenal cortical carcinoma and pheochromocytoma are considered. Biochemical correlation and endocrine and surgical consultation may be helpful 2. Ascites in the abdomen and pelvis. Density values between 10 and 20 Hounsfield units, small amount of blood in the peritoneum is possible though most all of the hyperdense material appears to be either periadrenal  or in the anterior pararenal space in the LEFT hemiabdomen. 3. Cystic lesion in the  pelvis measuring 10 x 7 cm when measured in a similar fashion approximately 8 x 6 cm. Indolent neoplasm could have this appearance. This is concerning for low-grade cystic neoplasm. Recommend GYN consult, and consider pelvis MRI w/o and w/ contrast if clinically warranted. Note: This recommendation does not apply to premenarchal patients and to those with increased risk (genetic, family history, elevated tumor markers or other high-risk factors) of ovarian cancer. Reference: JACR 2020 Feb; 17(2):248-254 4. Cystic areas in the bilateral kidneys, some areas indeterminate based on density values. Slight enlargement of the largest area on the RIGHT that previously measured water density now with 45 Hounsfield units. Some of these areas could represent small solid renal lesions though many appeared simple in terms of density on prior imaging. Consider follow-up renal sonogram or multiphase CT when the patient is able. 5. Small pleural effusions in the chest, associated with septal thickening at the lung bases could reflect mild pulmonary edema. 6. Cardiomegaly with signs of mitral annular and aortic valvular calcification. 7. Aortic atherosclerosis. Coronary artery disease and valvular disease. These results were called by telephone at the time of interpretation on 10/26/2020 at 1:29 pm to provider [facility] , who verbally acknowledged these results. Aortic Atherosclerosis (ICD10-I70.0). Electronically Signed   By: Zetta Bills M.D.   On: 10/26/2020 13:30   DG Chest Portable 1 View  Result Date: 10/26/2020 CLINICAL DATA:  Shortness of breath and back pain EXAM: PORTABLE CHEST 1 VIEW COMPARISON:  07/13/2020 FINDINGS: Cardiac shadow is enlarged but stable. Postsurgical changes are again seen. Lungs are well aerated bilaterally. Mild vascular congestion is noted but stable from the prior exam. No significant edema or focal infiltrate is noted. No bony abnormality is seen. IMPRESSION: Mild vascular congestion  stable from the prior exam. Electronically Signed   By: Inez Catalina M.D.   On: 10/26/2020 15:17   Assessment & Plan by Problem: Active Problems:   Retroperitoneal bleed  Ms. Chidinma Clites. Dillin is a 64 year old female with past medical history of chronic systolic and diastolic heart failure, chronic atrial fibrillation (on Eliquis,) CAD s/p CABG, aortic stenosis s/p bioprosthetic aortic valve, prior CVA of left cerebellum and pons, HTN, HLD, COPD, CKD IV, anxiety, and current tobacco use disorder who presented to [facility] on 10/26/2020 for evaluation of left flank pain found to have enlargement of known left adrenal mass with hemorrhage into neoplasm and retroperitoneum.  #Hemorrhagic left adrenal mass, active Patient with known large left-sided adrenal mass noted on imaging in February of 2019 presenting today with sudden onset of left-flank pain. Initial imaging reveals enlargement of the mass with hemorrhage into neoplasm itself as well as the retroperitoneum. Although there is active hemorrhage, she is not hypotensive and hemoglobin appears to be stable and consistent with her baseline. IR and General Surgery both have been consulted by ED provider who reported that both services wish to hold off on intervention at this time, however they are available for reconsultation if patient is to decompensate. At this time, the mass has not been biopsied, however pheochromocytoma and adrenal cortical carcinoma remain high on the differential. Of note, patient does have a strong family history of cancer including ovarian and breast cancer in first degree relatives raising the suspicion for malignancy. At this time, patient is not stable for workup of the mass and requires stabilization of her active bleed. She received KCentra in the ED and her  home anticoagulation is being held. We will closely monitor her for signs of worsening bleeding and escalate management as required. -Oxycodone 54m tablet Q6H PRN for moderate  to severe pain -Hydromorphone 141mIV Q4H PRN for severe pain -PT-INR and APTT tomorrow morning -CBC Q8H, monitor hemoglobin -Transfuse if Hb <7 -Holding home Eliquis -IR and General surgery are aware of patient, call if decompensates  #Atrial fibrillation with RVR, chronic Patient in atrial fibrillation with RVR throughout ED course. Her home medication regimen for this condition includes metoprolol, diltiazem, and Eliquis. Despite reported adherence to home medications, heart rate currently in the 100-120s. She would benefit from initiation of diltiazem drip. In the setting of her hemorrhagic left adrenal mass, we will continue to hold her home Eliquis. -Diltiazem drip 5-1547mr -Holding home metoprolol -Holding home Eliquis  #Large pelvic cyst, active #Cystic areas in bilateral kidneys, active Patient noted to have 10 x 7cm cystic lesion in the pelvis on CT scan concerning low-grade cystic neoplasm as well as multiple cystic areas in bilateral kidneys. Similar findings noted in initial CT Abdomen/Pelvis in February of 2019. Although further workup of these lesions is warranted, patient's current acute bleed is the primary focus at this time. -Radiology recommendations:  -GYN consult  -MRI pelvis with and without contrast if clinically warranted  -Consider follow-up renal sonogram or multiphase CT when the patient is able  #Hypertension, chronic Patient significantly hypertensive in the ED. -Diltiazem drip 5-83m73m -Holding home metoprolol  #Chronic systolic and diastolic heart failure, chronic Patient endorses shortness of breath not worsened from her baseline. On arrival to the ED, patient saturating well on room air, however she has required initiation of 1L oxygen via nasal cannula to maintain oxygen saturation in the mid-to-high 90s. On physical examination, she does not appear to be volume overloaded. Chest x-ray showing mild vascular congestion unchanged from baseline. We will  restart her home medication regimen at this time. -Continue home torsemide 20mg61mday, Wednesday and Friday -Continue digoxin 0.125mg 60may, Wednesday and Friday  -Digoxin level -1L O2 via nasal cannula, wean as tolerated  #HLD, chronic -Continue home gemfibrozil  #Anxiety, chronic -Continue home sertraline  Code status: Full code VTE ppx: SCDs Bowel regimen: Senna twice daily Diet: Heart healthy PT/OT recs: None at this time  Dispo: Admit patient to Inpatient with expected length of stay greater than 2 midnights.  Signed: JohnsoCato Mulligan1/12/2019, 4:34 PM  Pager: [phone] 5pm on weekdays and 1pm on weekends: On Call pager: [phone]

## 2020-10-26 NOTE — Progress Notes (Signed)
IR consulted by Coral Ceo, PA-C for possible adrenal mass embolization.  Patient presented to [facility] today with complaint of left flank/back pain. Known history of adrenal mass. Was found to have hemorrhage of adrenal mass on CT a/p today.  Case/images have been reviewed with Dr. Pascal Lux. Patient currently on anticoagulation (Eliquis) for atrial fibrillation, probable cause of bleed. In addition, patient with history of renal insufficiency (Cr currently 1.96) and PAD. Given above (renal issues, PAD, and coagulopathy) patient is not a candidate for embolization- no plans for IR interventions at this time. Recommend correcting coagulopathy in hopes that bleeding will stop. Above was discussed between Dr. Pascal Lux and Coral Ceo, PA-C.  Please call IR with questions/concerns.   Bea Graff Kalkidan Caudell, PA-C 10/26/2020, 2:02 PM

## 2020-10-26 NOTE — ED Provider Notes (Signed)
[place] EMERGENCY DEPARTMENT Provider Note   CSN: [id number] Arrival date & time: 10/26/20  0841     History Chief Complaint  Patient presents with  . Flank Pain  . Back Pain    Renee Griffin is a 64 y.o. female.  HPI   64 year old female with a history of depression, diabetes, congenital heart disease, hyperlipidemia, hypertension, panic attack, CVA, A. fib, COPD, CHF, diabetes, who presents to the emergency department today for evaluation of abdominal pain.  Patient reports sudden onset of left-sided abdominal/left flank pain that started this morning.  Also complaining of pain in her back.  She has some shortness of breath.  She denies any chest pain.    Past Medical History:  Diagnosis Date  . Depression   . Diabetes mellitus without complication ([place])   . Heart disease, congenital   . High cholesterol   . Hypertension   . Panic attack   . Stroke [facility])     Patient Active Problem List   Diagnosis Date Noted  . Acute exacerbation of chronic obstructive pulmonary disease (COPD) ([place]) 06/23/2020  . Atrial fibrillation with RVR ([place]) 05/23/2020  . Acute congestive heart failure ([place]) 05/17/2020  . NSTEMI (non-ST elevated myocardial infarction) ([place]) 02/07/2020  . Acute systolic heart failure ([place]) 01/19/2020  . Acute lower GI bleeding 12/16/2018  . Dehydration 04/13/2018  . Ovarian mass, left   . Abdominal pain 02/01/2018  . Essential hypertension 03/04/2016  . HLD (hyperlipidemia) 03/04/2016  . Type 2 diabetes mellitus with circulatory disorder ([place]) 03/04/2016  . Obesity 03/04/2016  . CVA (cerebral vascular accident) ([place]) 03/05/2015  . Bilateral low back pain without sciatica 03/05/2015  . Essential hypertension, benign 03/05/2015  . Tobacco use disorder 03/05/2015  . Hyperlipidemia 03/05/2015  . Hyperparathyroidism, primary ([place]) 02/13/2013  . Generalized ischemic cerebrovascular disease 02/06/2013  . Hyperreflexia 02/06/2013  .  Abnormality of gait 02/06/2013  . Disturbance of skin sensation 02/06/2013  . CVA (cerebral infarction) 06/03/2012  . HTN (hypertension) 06/03/2012  . Dyslipidemia 06/03/2012  . Anxiety 06/03/2012    Past Surgical History:  Procedure Laterality Date  . CARDIAC SURGERY  2011   CABG  . COLONOSCOPY WITH PROPOFOL N/A 02/08/2018   Procedure: COLONOSCOPY WITH PROPOFOL;  Surgeon: Otis Brace, MD;  Location: [facility];  Service: Gastroenterology;  Laterality: N/A;  . COLONOSCOPY WITH PROPOFOL N/A 12/16/2018   Procedure: COLONOSCOPY WITH PROPOFOL;  Surgeon: Ronald Lobo, MD;  Location: [facility];  Service: Endoscopy;  Laterality: N/A;  . DILATION AND EVACUATION     Patient states she had an abortion 30 or 40 years ago  . ESOPHAGOGASTRODUODENOSCOPY (EGD) WITH PROPOFOL Left 02/05/2018   Procedure: ESOPHAGOGASTRODUODENOSCOPY (EGD) WITH PROPOFOL;  Surgeon: Otis Brace, MD;  Location: [facility];  Service: Gastroenterology;  Laterality: Left;  . IR ANGIOGRAM SELECTIVE EACH ADDITIONAL VESSEL  12/18/2018  . IR ANGIOGRAM SELECTIVE EACH ADDITIONAL VESSEL  12/18/2018  . IR ANGIOGRAM SELECTIVE EACH ADDITIONAL VESSEL  12/18/2018  . IR ANGIOGRAM SELECTIVE EACH ADDITIONAL VESSEL  12/18/2018  . IR ANGIOGRAM VISCERAL SELECTIVE  12/18/2018  . IR EMBO ART  VEN HEMORR LYMPH EXTRAV  INC GUIDE ROADMAPPING  12/18/2018  . IR US GUIDE VASC ACCESS RIGHT  12/18/2018  . LEFT HEART CATH AND CORONARY ANGIOGRAPHY N/A 02/10/2020   Procedure: LEFT HEART CATH AND CORONARY ANGIOGRAPHY;  Surgeon: Dixie Dials, MD;  Location: [facility];  Service: Cardiovascular;  Laterality: N/A;  . WISDOM TOOTH EXTRACTION  OB History    Gravida  1   Para      Term      Preterm      AB  1   Living  0     SAB      TAB  1   Ectopic      Multiple      Live Births              Family History  Problem Relation Age of Onset  . Cancer Mother        uterian OR cervical  . Cancer Father         lung  . Leukemia Brother   . Cancer Sister        breast    Social History   Tobacco Use  . Smoking status: Current Every Day Smoker    Packs/day: 1.00    Years: 50.00    Pack years: 50.00    Types: Cigarettes  . Smokeless tobacco: Never Used  Vaping Use  . Vaping Use: Never used  Substance Use Topics  . Alcohol use: No    Alcohol/week: 0.0 standard drinks  . Drug use: No    Home Medications Prior to Admission medications   Medication Sig Start Date End Date Taking? Authorizing Provider  ACCU-CHEK AVIVA PLUS test strip See admin instructions. 01/29/16   [provider]  ACCU-CHEK SOFTCLIX LANCETS lancets See admin instructions. 01/30/16   [provider]  acetaminophen (TYLENOL) 325 MG tablet Take 2 tablets (650 mg total) by mouth every 4 (four) hours as needed for headache or mild pain. 01/22/20   Dixie Dials, MD  albuterol (VENTOLIN HFA) 108 (90 Base) MCG/ACT inhaler Inhale 2 puffs into the lungs every 6 (six) hours as needed for wheezing or shortness of breath. 01/22/20   Dixie Dials, MD  allopurinol (ZYLOPRIM) 100 MG tablet Take 1 tablet (100 mg total) by mouth daily. 07/13/20   Dorie Rank, MD  ALPRAZolam Duanne Moron) 0.5 MG tablet Take 1 tablet (0.5 mg total) by mouth 2 (two) times daily as needed for anxiety. 02/24/20   Virgel Manifold, MD  apixaban (ELIQUIS) 2.5 MG TABS tablet Take 1 tablet (2.5 mg total) by mouth 2 (two) times daily. 07/13/20   Dorie Rank, MD  aspirin EC 81 MG tablet Take 1 tablet (81 mg total) by mouth daily. 01/22/20 01/21/21  Dixie Dials, MD  Blood Glucose Monitoring Suppl (ACCU-CHEK AVIVA PLUS) w/Device KIT See admin instructions. 01/29/16   [provider]  cholecalciferol (VITAMIN D3) 25 MCG (1000 UNIT) tablet Take 1 tablet (1,000 Units total) by mouth daily. 07/13/20   Dorie Rank, MD  digoxin Fonnie [place]) 0.125 MG tablet Take 1 tablet (0.125 mg total) by mouth every Monday, Wednesday, and Friday. 05/29/20   Dixie Dials, MD    diltiazem (CARDIZEM CD) 120 MG 24 hr capsule Take 1 capsule (120 mg total) by mouth daily. 05/28/20   Dixie Dials, MD  ferrous sulfate 325 (65 FE) MG tablet Take 1 tablet (325 mg total) by mouth daily with breakfast. 07/13/20   Dorie Rank, MD  gemfibrozil (LOPID) 600 MG tablet Take 600 mg by mouth 2 (two) times daily. 02/19/18   [provider]  loperamide (IMODIUM) 2 MG capsule Take 1 capsule (2 mg total) by mouth 4 (four) times daily as needed for diarrhea or loose stools. Patient taking differently: Take 2 mg by mouth daily as needed for diarrhea or loose stools.  02/24/19   Julianne Rice, MD  metoprolol tartrate (LOPRESSOR) 25 MG tablet Take 0.5 tablets (12.5 mg total) by mouth 2 (two) times daily. 12/25/18   Dixie Dials, MD  Multiple Vitamin (MULTIVITAMIN WITH MINERALS) TABS tablet Take 1 tablet by mouth daily.    [provider]  nitroGLYCERIN (NITROSTAT) 0.4 MG SL tablet Place 1 tablet (0.4 mg total) under the tongue every 5 (five) minutes x 3 doses as needed for chest pain. 02/14/20   Dixie Dials, MD  oxymetazoline (AFRIN) 0.05 % nasal spray Place 1 spray into both nostrils 2 (two) times daily.    [provider]  pantoprazole (PROTONIX) 40 MG tablet Take 1 tablet (40 mg total) by mouth 2 (two) times daily. Patient taking differently: Take 40 mg by mouth 2 (two) times daily as needed (heartburn).  02/09/18   Dixie Dials, MD  potassium chloride (KLOR-CON) 10 MEQ tablet Take 10 mEq by mouth daily. Patient not taking: Reported on 07/13/2020    [provider]  prazosin (MINIPRESS) 1 MG capsule Take 1 capsule (1 mg total) by mouth 2 (two) times daily. Patient not taking: Reported on 07/13/2020 02/09/18   Dixie Dials, MD  predniSONE (DELTASONE) 10 MG tablet Take 1 tablet (10 mg total) by mouth daily before breakfast. After 5 days decrease dose to 5 mg.  (1/2 of 10) for 10 days. Patient not taking: Reported on 07/13/2020 06/24/20   Dixie Dials, MD   promethazine (PHENERGAN) 25 MG tablet Take 1 tablet (25 mg total) by mouth every 6 (six) hours as needed for nausea or vomiting. 07/13/20   Dorie Rank, MD  sertraline (ZOLOFT) 100 MG tablet Take 100 mg by mouth at bedtime.     [provider]  tiZANidine (ZANAFLEX) 2 MG tablet Take 1 tablet (2 mg total) by mouth at bedtime as needed for muscle spasms. 12/25/18   Dixie Dials, MD  torsemide (DEMADEX) 20 MG tablet Take 1 tablet (20 mg total) by mouth every Monday, Wednesday, and Friday. 06/24/20   Dixie Dials, MD  valACYclovir (VALTREX) 500 MG tablet Take 500 mg by mouth daily.    [provider]    Allergies    Amoxicillin-pot clavulanate, Bupropion, and Nicotine  Review of Systems   Review of Systems  Constitutional: Negative for fever.  HENT: Negative for ear pain and sore throat.   Eyes: Negative for visual disturbance.  Respiratory: Positive for shortness of breath.   Cardiovascular: Negative for chest pain.  Gastrointestinal: Positive for abdominal pain. Negative for constipation, diarrhea, nausea and vomiting.  Genitourinary: Positive for flank pain. Negative for dysuria and hematuria.  Musculoskeletal: Positive for back pain.  Skin: Negative for rash.  Neurological: Negative for headaches.  All other systems reviewed and are negative.   Physical Exam Updated Vital Signs BP (!) 184/91   Pulse (!) 108   Temp 97.6 F (36.4 C) (Oral)   Resp 18   Ht _0  (1.6 m)   Wt 72.9 kg   SpO2 98%   BMI 28.47 kg/m   Physical Exam Vitals and nursing note reviewed.  Constitutional:      General: She is not in acute distress.    Appearance: She is well-developed.  HENT:     Head: Normocephalic and atraumatic.  Eyes:     Conjunctiva/sclera: Conjunctivae normal.  Cardiovascular:     Rate and Rhythm: Regular rhythm. Tachycardia present.     Pulses:          Dorsalis pedis pulses are 2+ on the right side and 2+  on the left side.     Heart sounds: No murmur  heard.   Pulmonary:     Effort: Pulmonary effort is normal. No respiratory distress.     Breath sounds: Normal breath sounds. No wheezing or rhonchi.  Abdominal:     General: Bowel sounds are normal.     Palpations: Abdomen is soft.     Tenderness: There is abdominal tenderness.     Comments: Diffuse abd TTP, worse to the LUQ. Left CVA TTP.  Musculoskeletal:     Cervical back: Neck supple.  Skin:    General: Skin is warm and dry.  Neurological:     Mental Status: She is alert.     ED Results / Procedures / Treatments   Labs (all labs ordered are listed, but only abnormal results are displayed) Labs Reviewed  COMPREHENSIVE METABOLIC PANEL - Abnormal; Notable for the following components:      Result Value   CO2 17 (*)    Glucose, Bld 163 (*)    BUN 30 (*)    Creatinine, Ser 1.96 (*)    Total Protein 6.4 (*)    Albumin 3.4 (*)    GFR, Estimated 28 (*)    Anion gap 16 (*)    All other components within normal limits  CBC - Abnormal; Notable for the following components:   WBC 13.4 (*)    Hemoglobin 11.8 (*)    RDW 18.2 (*)    All other components within normal limits  URINALYSIS, ROUTINE W REFLEX MICROSCOPIC - Abnormal; Notable for the following components:   Hgb urine dipstick MODERATE (*)    Protein, ur 100 (*)    Leukocytes,Ua TRACE (*)    All other components within normal limits  I-STAT CHEM 8, ED - Abnormal; Notable for the following components:   BUN 41 (*)    Creatinine, Ser 2.00 (*)    Glucose, Bld 178 (*)    Hemoglobin 10.2 (*)    HCT 30.0 (*)    All other components within normal limits  TROPONIN I (HIGH SENSITIVITY) - Abnormal; Notable for the following components:   Troponin I (High Sensitivity) 46 (*)    All other components within normal limits  TROPONIN I (HIGH SENSITIVITY) - Abnormal; Notable for the following components:   Troponin I (High Sensitivity) 86 (*)    All other components within normal limits  RESP PANEL BY RT PCR (RSV, FLU A&B,  COVID)  LIPASE, BLOOD  TYPE AND SCREEN    EKG EKG Interpretation  Date/Time:  Monday October 26 2020 08:47:08 EDT Ventricular Rate:  112 PR Interval:    QRS Duration: 90 QT Interval:  318 QTC Calculation: 434 R Axis:   117 Text Interpretation: Atrial fibrillation with rapid ventricular response Right axis deviation ST & T wave abnormality, consider lateral ischemia Abnormal ECG Confirmed by Gerlene Fee [phone]) on 10/26/2020 12:14:40 PM   Radiology CT ABDOMEN PELVIS WO CONTRAST  Result Date: 10/26/2020 CLINICAL DATA:  Acute nonlocalized LEFT flank pain, LEFT-sided pain EXAM: CT ABDOMEN AND PELVIS WITHOUT CONTRAST TECHNIQUE: Multidetector CT imaging of the abdomen and pelvis was performed following the standard protocol without IV contrast. COMPARISON:  12/25/2018 FINDINGS: Lower chest: Mild septal thickening at the lung bases. No dense consolidation. No pleural effusion. Heart size is enlarged with signs of mitral annular and aortic valvular calcification. No pericardial effusion. Small pleural effusions in the chest. Trace on the LEFT and trace on the RIGHT slightly greater on the RIGHT than the  LEFT. Hepatobiliary: Normal appearance of the liver on noncontrast imaging. No pericholecystic stranding. Pancreas: Pancreas splayed over a large LEFT adrenal mass, indistinct margins in the setting of periadrenal hemorrhage. Spleen: Stable low-density lesion in the spleen. Adrenals/Urinary Tract: Lobular low-attenuation lesion in the RIGHT adrenal gland is unchanged measuring approximately 2.4 x 2.4 cm. LEFT adrenal lesion with heterogeneity, calcification and enlargement since the previous imaging study 13 x 13 cm previously approximately 10 cm greatest axial dimension. This measures approximately 14 cm greatest craniocaudal dimension previously approximately 10 cm. Waves of variable density extend from the lesion which displaces the kidney inferiorly and bowel loops and pancreas anteriorly with much  more mass effect upon the pancreas and LEFT upper quadrant structures than on the prior study. Cystic areas in the bilateral kidneys, some areas indeterminate based on density values, for instance LEFT kidney with a 1.7 x 1.6 cm area measuring 42 Hounsfield units (image 53, series 3) nephrolithiasis on the LEFT. This is stable compared to December of 2019. Slight enlargement of the largest area on the RIGHT that previously measured water density now with 45 Hounsfield unit density on image 44 of series 3. Stable appearance of intermediate density lesion in the interpolar RIGHT kidney on image 42 of series 3 No nephrolithiasis on the RIGHT. 6 mm calculus in the interpolar LEFT kidney and vascular calcifications in the LEFT kidney as well. Urinary bladder under distended limiting assessment. Stomach/Bowel: Mass effect upon bowel loops in the upper abdomen. No signs of bowel obstruction or acute bowel process. Vascular/Lymphatic: Calcified atheromatous plaque of the abdominal aorta. No aneurysmal dilation. There is no gastrohepatic or hepatoduodenal ligament lymphadenopathy. No retroperitoneal or mesenteric lymphadenopathy. No pelvic sidewall lymphadenopathy. Reproductive: Cystic lesion in the pelvis measuring 10 x 7 cm when measured in a similar fashion approximately 8 x 6 cm. Other: Small volume ascites. Density between 10 and 20 Hounsfield units throughout the abdomen. Musculoskeletal: No acute bone finding. No destructive bone process. IMPRESSION: 1. Interval enlargement of the LEFT adrenal mass with heterogeneity, calcification with enlargement since the previous imaging study. This lesion displaces the kidney inferiorly and bowel loops and pancreas anteriorly with much more mass effect upon the pancreas and LEFT upper quadrant structures than on the prior study. Hemorrhage into existing adrenal neoplasm and into adjacent retroperitoneum, mixed density with areas of high density could be seen in the setting of  ongoing bleeding. Given size and appearance adrenal cortical carcinoma and pheochromocytoma are considered. Biochemical correlation and endocrine and surgical consultation may be helpful 2. Ascites in the abdomen and pelvis. Density values between 10 and 20 Hounsfield units, small amount of blood in the peritoneum is possible though most all of the hyperdense material appears to be either periadrenal or in the anterior pararenal space in the LEFT hemiabdomen. 3. Cystic lesion in the pelvis measuring 10 x 7 cm when measured in a similar fashion approximately 8 x 6 cm. Indolent neoplasm could have this appearance. This is concerning for low-grade cystic neoplasm. Recommend GYN consult, and consider pelvis MRI w/o and w/ contrast if clinically warranted. Note: This recommendation does not apply to premenarchal patients and to those with increased risk (genetic, family history, elevated tumor markers or other high-risk factors) of ovarian cancer. Reference: JACR 2020 Feb; 17(2):248-254 4. Cystic areas in the bilateral kidneys, some areas indeterminate based on density values. Slight enlargement of the largest area on the RIGHT that previously measured water density now with 45 Hounsfield units. Some of these areas could represent small  solid renal lesions though many appeared simple in terms of density on prior imaging. Consider follow-up renal sonogram or multiphase CT when the patient is able. 5. Small pleural effusions in the chest, associated with septal thickening at the lung bases could reflect mild pulmonary edema. 6. Cardiomegaly with signs of mitral annular and aortic valvular calcification. 7. Aortic atherosclerosis. Coronary artery disease and valvular disease. These results were called by telephone at the time of interpretation on 10/26/2020 at 1:29 pm to provider [facility] , who verbally acknowledged these results. Aortic Atherosclerosis (ICD10-I70.0). Electronically Signed   By: Zetta Bills M.D.    On: 10/26/2020 13:30   DG Chest Portable 1 View  Result Date: 10/26/2020 CLINICAL DATA:  Shortness of breath and back pain EXAM: PORTABLE CHEST 1 VIEW COMPARISON:  07/13/2020 FINDINGS: Cardiac shadow is enlarged but stable. Postsurgical changes are again seen. Lungs are well aerated bilaterally. Mild vascular congestion is noted but stable from the prior exam. No significant edema or focal infiltrate is noted. No bony abnormality is seen. IMPRESSION: Mild vascular congestion stable from the prior exam. Electronically Signed   By: Inez Catalina M.D.   On: 10/26/2020 15:17    Procedures Procedures (including critical care time)  Medications Ordered in ED Medications  HYDROmorphone (DILAUDID) injection 1 mg (1 mg Intravenous Given 10/26/20 1456)  fentaNYL (SUBLIMAZE) injection 100 mcg (100 mcg Intravenous Given 10/26/20 1226)  ALPRAZolam (XANAX) tablet 0.5 mg (0.5 mg Oral Given 10/26/20 1245)  fentaNYL (SUBLIMAZE) injection 100 mcg (100 mcg Intravenous Given 10/26/20 1353)  prothrombin complex conc human (KCENTRA) IVPB 3,623 Units (0 Units Intravenous Stopped 10/26/20 1450)  ondansetron (ZOFRAN) injection 4 mg (4 mg Intravenous Given 10/26/20 1456)    ED Course  I have reviewed the triage vital signs and the nursing notes.  Pertinent labs & imaging results that were available during my care of the patient were reviewed by me and considered in my medical decision making (see chart for details).    MDM Rules/Calculators/A&P                          64 year old female presenting for evaluation of abdominal pain.  Reviewed/interpreted labs CBC with mild leukocytosis, mild anemia which appears stable from prior CMP with low bicarb which appears chronic, mildly elevated anion gap.  BUN/creatinine are elevated but appear to be baseline Lipase negative Troponin marginally elevated UA with moderate hematuria, proteinuria, trace leuks, no bacteria or WBCs on microscopy.  EKG with Atrial  fibrillation with rapid ventricular response Right axis deviation ST & T wave abnormality, consider lateral ischemia Abnormal ECG   Reviewed/interpreted imaging CT abd/pelvis -shows enlargement of the left adrenal mass with evidence of retroperitoneal bleeding.  Ascites noted in the abdomen/pelvis.  Also with cystic lesion in the pelvis, cystic lesions in the kidneys, pleural effusions. CXR pending on admission  12:23 PM CONSULT with Dr. Pascal Lux with radiology. He reviewed pts prior imaging. He advises to consider a noncon study to eval for possible hematoma of the left adrenal mass, ureteral stone, etc. He states that if the decision to proceed with contrasted exam is decided then he would recommend a reduced dose scan.   1:14 PM Notified by radiology of patients CT imaging. Consult to IR placed.   1:21 PM CONSULT with IR tech who discussed case with Dr. Pascal Lux. He states that this likely would be a general surgery case but he will review chart and call back.  1:31 PM CONSULT with Brook, APP with general surgery. She states surg will review imaging.  1:44 PM CONSULT with Dr. Pascal Lux with IR. He reviewed imaging. States that if patient became hemodynamically unstable then he would complete an arteriogram & embolization however if patient continues to be stable then there is not an emergent indication for this.   1:58 PM CONSULT with Brook, APP with gen surg. She states that general surgery team does not feel that surgery is indicated and recommends supportive therapy and continued resuscitation with blood products if needed. Requested that they eval pt at bedside and they stated that if hospitalist team wants them to see patient they should reconsult.   Discussed case with Corene Cornea from pharmacy who recommends giving Kcentra for reveral of anticoagulation.   2:48 PM CONSULT with Dr. Darrick Meigs who accepts patient for admission   Final Clinical Impression(s) / ED Diagnoses Final diagnoses:   Retroperitoneal bleeding    Rx / DC Orders ED Discharge Orders    None       Rodney Booze, PA-C 10/26/20 1542    Maudie Flakes, MD 10/26/20 1646

## 2020-10-26 NOTE — Consult Note (Signed)
Referring Physician: Massie Maroon  Renee Griffin is an 64 y.o. female.                       Chief Complaint: Atrial fibrillation, retroperitoneal bleed, left adrenal mass and severe left sided abdominal pain  HPI: 64 years old white female with Hypertension, CAD, S/P CABG, Severe MR, Moderate TR, S/P stroke, S/P bioprosthetic AV replacement, Chronic atrial fibrillation, COPD, CKD, IV, Anxiety, Panic attack and tobacco use disorder has severe left sided abdominal pain.  Her chronic atrial fibrillation is rate controlled and she has not tolerated full dose anticoagulants in past with h/o GI bleed. Chest x-ray this visit shows stable mild vascular congestion. CT of abdomen and Pelvis is now showing enlarging left adrenal mass + cystic areas in both kidneys + 6 mm left kidney calculus + Cystic lesion in Pelvis. Post 12/16/2018 admission for acute GI bleed patient was to see [facility] on [place] for Pelvic mass and Dr. Nicolette Bang of Urology clinic at [address] for Adrenal mass and cysts on kidneys.   02/01/2018 she was seen by Dr. Nicolette Bang and Dr. Kathie Rhodes for adrenal tumor. Her CA 125 test was normal, cortisol level was normal and free metanephrine level was normal. Normetanephrine level was elevated at 444 pg and was considered indeterminate.  Patient was not able to made follow up visits at those times and also during 1 and 1/2 year of COVID-19 Pandemic.   Past Medical History:  Diagnosis Date  . Depression   . Diabetes mellitus without complication ([place])   . Heart disease, congenital   . High cholesterol   . Hypertension   . Panic attack   . Stroke [facility])       Past Surgical History:  Procedure Laterality Date  . CARDIAC SURGERY  2011   CABG  . COLONOSCOPY WITH PROPOFOL N/A 02/08/2018   Procedure: COLONOSCOPY WITH PROPOFOL;  Surgeon: Otis Brace, MD;  Location: [facility];  Service: Gastroenterology;  Laterality: N/A;  .  COLONOSCOPY WITH PROPOFOL N/A 12/16/2018   Procedure: COLONOSCOPY WITH PROPOFOL;  Surgeon: Ronald Lobo, MD;  Location: [facility];  Service: Endoscopy;  Laterality: N/A;  . DILATION AND EVACUATION     Patient states she had an abortion 30 or 40 years ago  . ESOPHAGOGASTRODUODENOSCOPY (EGD) WITH PROPOFOL Left 02/05/2018   Procedure: ESOPHAGOGASTRODUODENOSCOPY (EGD) WITH PROPOFOL;  Surgeon: Otis Brace, MD;  Location: [facility];  Service: Gastroenterology;  Laterality: Left;  . IR ANGIOGRAM SELECTIVE EACH ADDITIONAL VESSEL  12/18/2018  . IR ANGIOGRAM SELECTIVE EACH ADDITIONAL VESSEL  12/18/2018  . IR ANGIOGRAM SELECTIVE EACH ADDITIONAL VESSEL  12/18/2018  . IR ANGIOGRAM SELECTIVE EACH ADDITIONAL VESSEL  12/18/2018  . IR ANGIOGRAM VISCERAL SELECTIVE  12/18/2018  . IR EMBO ART  VEN HEMORR LYMPH EXTRAV  INC GUIDE ROADMAPPING  12/18/2018  . IR US GUIDE VASC ACCESS RIGHT  12/18/2018  . LEFT HEART CATH AND CORONARY ANGIOGRAPHY N/A 02/10/2020   Procedure: LEFT HEART CATH AND CORONARY ANGIOGRAPHY;  Surgeon: Dixie Dials, MD;  Location: [place] [facility];  Service: Cardiovascular;  Laterality: N/A;  . WISDOM TOOTH EXTRACTION      Family History  Problem Relation Age of Onset  . Cancer Mother        uterian OR cervical  . Cancer Father        lung  . Leukemia Brother   . Cancer Sister  breast   Social History:  reports that she has been smoking cigarettes. She has a 50.00 pack-year smoking history. She has never used smokeless tobacco. She reports that she does not drink alcohol and does not use drugs.  Allergies:  Allergies  Allergen Reactions  . Amoxicillin-Pot Clavulanate Diarrhea    Has patient had a PCN reaction causing immediate rash, facial/tongue/throat swelling, SOB or lightheadedness with hypotension: Unknown Has patient had a PCN reaction causing severe rash involving mucus membranes or skin necrosis:UnknoWN Has patient had a PCN reaction that required  hospitalization: Unknown Has patient had a PCN reaction occurring within the last 10 years: Unknown If all of the above answers are "NO", then may proceed with Cephalosporin use.   . Bupropion Nausea Only  . Nicotine Nausea Only and Rash    Reaction to patches    (Not in a hospital admission)   Results for orders placed or performed during the hospital encounter of 10/26/20 (from the past 48 hour(s))  Lipase, blood     Status: None   Collection Time: 10/26/20  8:46 AM  Result Value Ref Range   Lipase 22 11 - 51 U/L    Comment: Performed at [facility], [address], [place], [place] [address]  Comprehensive metabolic panel     Status: Abnormal   Collection Time: 10/26/20  8:46 AM  Result Value Ref Range   Sodium 141 135 - 145 mmol/L   Potassium 3.6 3.5 - 5.1 mmol/L   Chloride 108 98 - 111 mmol/L   CO2 17 (L) 22 - 32 mmol/L   Glucose, Bld 163 (H) 70 - 99 mg/dL    Comment: Glucose reference range applies only to samples taken after fasting for at least 8 hours.   BUN 30 (H) 8 - 23 mg/dL   Creatinine, Ser 1.96 (H) 0.44 - 1.00 mg/dL   Calcium 10.2 8.9 - 10.3 mg/dL   Total Protein 6.4 (L) 6.5 - 8.1 g/dL   Albumin 3.4 (L) 3.5 - 5.0 g/dL   AST 18 15 - 41 U/L   ALT 9 0 - 44 U/L   Alkaline Phosphatase 103 38 - 126 U/L   Total Bilirubin 1.0 0.3 - 1.2 mg/dL   GFR, Estimated 28 (L) >60 mL/min    Comment: (NOTE) Calculated using the CKD-EPI Creatinine Equation (2021)    Anion gap 16 (H) 5 - 15    Comment: Performed at [facility], [place] [address] [place], [place] [address]  CBC     Status: Abnormal   Collection Time: 10/26/20  8:46 AM  Result Value Ref Range   WBC 13.4 (H) 4.0 - 10.5 K/uL   RBC 3.93 3.87 - 5.11 MIL/uL   Hemoglobin 11.8 (L) 12.0 - 15.0 g/dL   HCT 38.9 36 - 46 %   MCV 99.0 80.0 - 100.0 fL   MCH 30.0 26.0 - 34.0 pg   MCHC 30.3 30.0 - 36.0 g/dL   RDW 18.2 (H) 11.5 - 15.5 %   Platelets 346 150 - 400 K/uL   nRBC 0.0 0.0 - 0.2 %    Comment:  Performed at [facility], [place] [address], [place], [place] [address]  Troponin I (High Sensitivity)     Status: Abnormal   Collection Time: 10/26/20 12:20 PM  Result Value Ref Range   Troponin I (High Sensitivity) 46 (H) <18 ng/L    Comment: (NOTE) Elevated high sensitivity troponin I (hsTnI) values and significant  changes  across serial measurements may suggest ACS but many other  chronic and acute conditions are known to elevate hsTnI results.  Refer to the "Links" section for chest pain algorithms and additional  guidance. Performed at [facility], [place] [address], [place], [place] [address]   Urinalysis, Routine w reflex microscopic Urine, Clean Catch     Status: Abnormal   Collection Time: 10/26/20  1:00 PM  Result Value Ref Range   Color, Urine YELLOW YELLOW   APPearance CLEAR CLEAR   Specific Gravity, Urine 1.014 1.005 - 1.030   pH 5.0 5.0 - 8.0   Glucose, UA NEGATIVE NEGATIVE mg/dL   Hgb urine dipstick MODERATE (A) NEGATIVE   Bilirubin Urine NEGATIVE NEGATIVE   Ketones, ur NEGATIVE NEGATIVE mg/dL   Protein, ur 100 (A) NEGATIVE mg/dL   Nitrite NEGATIVE NEGATIVE   Leukocytes,Ua TRACE (A) NEGATIVE   RBC / HPF 0-5 0 - 5 RBC/hpf   WBC, UA 0-5 0 - 5 WBC/hpf   Bacteria, UA NONE SEEN NONE SEEN   Squamous Epithelial / LPF 0-5 0 - 5    Comment: Performed at [facility], [place] [address], [place], [place] [address]  Resp Panel by RT PCR (RSV, Flu A&B, Covid) - Nasopharyngeal Swab     Status: None   Collection Time: 10/26/20  1:49 PM   Specimen: Nasopharyngeal Swab  Result Value Ref Range   SARS Coronavirus 2 by RT PCR NEGATIVE NEGATIVE    Comment: (NOTE) SARS-CoV-2 target nucleic acids are NOT DETECTED.  The SARS-CoV-2 RNA is generally detectable in upper respiratoy specimens during the acute phase of infection. The lowest concentration of SARS-CoV-2 viral copies this assay can detect is 131 copies/mL. A negative result does not preclude  SARS-Cov-2 infection and should not be used as the sole basis for treatment or other patient management decisions. A negative result may occur with  improper specimen collection/handling, submission of specimen other than nasopharyngeal swab, presence of viral mutation(s) within the areas targeted by this assay, and inadequate number of viral copies (<131 copies/mL). A negative result must be combined with clinical observations, patient history, and epidemiological information. The expected result is Negative.  Fact Sheet for Patients:  PinkCheek.be  Fact Sheet for Healthcare Providers:  GravelBags.it  This test is no t yet approved or cleared by the [place] FDA and  has been authorized for detection and/or diagnosis of SARS-CoV-2 by FDA under an Emergency Use Authorization (EUA). This EUA will remain  in effect (meaning this test can be used) for the duration of the COVID-19 declaration under Section 564(b)(1) of the Act, 21 U.S.C. section 360bbb-3(b)(1), unless the authorization is terminated or revoked sooner.     Influenza A by PCR NEGATIVE NEGATIVE   Influenza B by PCR NEGATIVE NEGATIVE    Comment: (NOTE) The Xpert Xpress SARS-CoV-2/FLU/RSV assay is intended as an aid in  the diagnosis of influenza from Nasopharyngeal swab specimens and  should not be used as a sole basis for treatment. Nasal washings and  aspirates are unacceptable for Xpert Xpress SARS-CoV-2/FLU/RSV  testing.  Fact Sheet for Patients: PinkCheek.be  Fact Sheet for Healthcare Providers: GravelBags.it  This test is not yet approved or cleared by the [place] FDA and  has been authorized for detection and/or diagnosis of SARS-CoV-2 by  FDA under an Emergency Use Authorization (EUA). This EUA will remain  in effect (meaning this test can be used) for the duration of the  Covid-19  declaration under Section  564(b)(1) of the Act, 21  U.S.C. section 360bbb-3(b)(1), unless the authorization is  terminated or revoked.    Respiratory Syncytial Virus by PCR NEGATIVE NEGATIVE    Comment: (NOTE) Fact Sheet for Patients: PinkCheek.be  Fact Sheet for Healthcare Providers: GravelBags.it  This test is not yet approved or cleared by the [place] FDA and  has been authorized for detection and/or diagnosis of SARS-CoV-2 by  FDA under an Emergency Use Authorization (EUA). This EUA will remain  in effect (meaning this test can be used) for the duration of the  COVID-19 declaration under Section 564(b)(1) of the Act, 21 U.S.C.  section 360bbb-3(b)(1), unless the authorization is terminated or  revoked. Performed at [facility], [place] [address] [place], [place] [address]   Troponin I (High Sensitivity)     Status: Abnormal   Collection Time: 10/26/20  1:49 PM  Result Value Ref Range   Troponin I (High Sensitivity) 86 (H) <18 ng/L    Comment: RESULT CALLED TO, READ BACK BY AND VERIFIED WITH: HANNIE VONKRETCHMAR,RN AT 1455 10/26/2020 BY ZBEECH. (NOTE) Elevated high sensitivity troponin I (hsTnI) values and significant  changes across serial measurements may suggest ACS but many other  chronic and acute conditions are known to elevate hsTnI results.  Refer to the Links section for chest pain algorithms and additional  guidance. Performed at [facility], [place] [address] [place], [place] [address]   Type and screen [place]     Status: None   Collection Time: 10/26/20  2:28 PM  Result Value Ref Range   ABO/RH(D) O POS    Antibody Screen NEG    Sample Expiration      10/29/2020,2359 Performed at [facility], [place] [address] [place], [place] [address]   I-stat chem 8, ED (not at [facility] or [facility])     Status: Abnormal   Collection Time: 10/26/20  3:18 PM  Result Value Ref  Range   Sodium 143 135 - 145 mmol/L   Potassium 3.7 3.5 - 5.1 mmol/L   Chloride 109 98 - 111 mmol/L   BUN 41 (H) 8 - 23 mg/dL   Creatinine, Ser 2.00 (H) 0.44 - 1.00 mg/dL   Glucose, Bld 178 (H) 70 - 99 mg/dL    Comment: Glucose reference range applies only to samples taken after fasting for at least 8 hours.   Calcium, Ion 1.24 1.15 - 1.40 mmol/L   TCO2 22 22 - 32 mmol/L   Hemoglobin 10.2 (L) 12.0 - 15.0 g/dL   HCT 30.0 (L) 36 - 46 %  CBC     Status: Abnormal   Collection Time: 10/26/20  3:41 PM  Result Value Ref Range   WBC 17.7 (H) 4.0 - 10.5 K/uL   RBC 2.98 (L) 3.87 - 5.11 MIL/uL   Hemoglobin 9.3 (L) 12.0 - 15.0 g/dL   HCT 30.5 (L) 36 - 46 %   MCV 102.3 (H) 80.0 - 100.0 fL   MCH 31.2 26.0 - 34.0 pg   MCHC 30.5 30.0 - 36.0 g/dL   RDW 18.3 (H) 11.5 - 15.5 %   Platelets 293 150 - 400 K/uL   nRBC 0.0 0.0 - 0.2 %    Comment: Performed at [place] [address], [place], [place] [address]   CT ABDOMEN PELVIS WO CONTRAST  Result Date: 10/26/2020 CLINICAL DATA:  Acute nonlocalized LEFT flank pain, LEFT-sided pain EXAM: CT ABDOMEN AND PELVIS WITHOUT CONTRAST TECHNIQUE: Multidetector  CT imaging of the abdomen and pelvis was performed following the standard protocol without IV contrast. COMPARISON:  12/25/2018 FINDINGS: Lower chest: Mild septal thickening at the lung bases. No dense consolidation. No pleural effusion. Heart size is enlarged with signs of mitral annular and aortic valvular calcification. No pericardial effusion. Small pleural effusions in the chest. Trace on the LEFT and trace on the RIGHT slightly greater on the RIGHT than the LEFT. Hepatobiliary: Normal appearance of the liver on noncontrast imaging. No pericholecystic stranding. Pancreas: Pancreas splayed over a large LEFT adrenal mass, indistinct margins in the setting of periadrenal hemorrhage. Spleen: Stable low-density lesion in the spleen. Adrenals/Urinary Tract: Lobular low-attenuation lesion in the RIGHT  adrenal gland is unchanged measuring approximately 2.4 x 2.4 cm. LEFT adrenal lesion with heterogeneity, calcification and enlargement since the previous imaging study 13 x 13 cm previously approximately 10 cm greatest axial dimension. This measures approximately 14 cm greatest craniocaudal dimension previously approximately 10 cm. Waves of variable density extend from the lesion which displaces the kidney inferiorly and bowel loops and pancreas anteriorly with much more mass effect upon the pancreas and LEFT upper quadrant structures than on the prior study. Cystic areas in the bilateral kidneys, some areas indeterminate based on density values, for instance LEFT kidney with a 1.7 x 1.6 cm area measuring 42 Hounsfield units (image 53, series 3) nephrolithiasis on the LEFT. This is stable compared to December of 2019. Slight enlargement of the largest area on the RIGHT that previously measured water density now with 45 Hounsfield unit density on image 44 of series 3. Stable appearance of intermediate density lesion in the interpolar RIGHT kidney on image 42 of series 3 No nephrolithiasis on the RIGHT. 6 mm calculus in the interpolar LEFT kidney and vascular calcifications in the LEFT kidney as well. Urinary bladder under distended limiting assessment. Stomach/Bowel: Mass effect upon bowel loops in the upper abdomen. No signs of bowel obstruction or acute bowel process. Vascular/Lymphatic: Calcified atheromatous plaque of the abdominal aorta. No aneurysmal dilation. There is no gastrohepatic or hepatoduodenal ligament lymphadenopathy. No retroperitoneal or mesenteric lymphadenopathy. No pelvic sidewall lymphadenopathy. Reproductive: Cystic lesion in the pelvis measuring 10 x 7 cm when measured in a similar fashion approximately 8 x 6 cm. Other: Small volume ascites. Density between 10 and 20 Hounsfield units throughout the abdomen. Musculoskeletal: No acute bone finding. No destructive bone process. IMPRESSION: 1.  Interval enlargement of the LEFT adrenal mass with heterogeneity, calcification with enlargement since the previous imaging study. This lesion displaces the kidney inferiorly and bowel loops and pancreas anteriorly with much more mass effect upon the pancreas and LEFT upper quadrant structures than on the prior study. Hemorrhage into existing adrenal neoplasm and into adjacent retroperitoneum, mixed density with areas of high density could be seen in the setting of ongoing bleeding. Given size and appearance adrenal cortical carcinoma and pheochromocytoma are considered. Biochemical correlation and endocrine and surgical consultation may be helpful 2. Ascites in the abdomen and pelvis. Density values between 10 and 20 Hounsfield units, small amount of blood in the peritoneum is possible though most all of the hyperdense material appears to be either periadrenal or in the anterior pararenal space in the LEFT hemiabdomen. 3. Cystic lesion in the pelvis measuring 10 x 7 cm when measured in a similar fashion approximately 8 x 6 cm. Indolent neoplasm could have this appearance. This is concerning for low-grade cystic neoplasm. Recommend GYN consult, and consider pelvis MRI w/o and w/ contrast if clinically warranted.  Note: This recommendation does not apply to premenarchal patients and to those with increased risk (genetic, family history, elevated tumor markers or other high-risk factors) of ovarian cancer. Reference: JACR 2020 Feb; 17(2):248-254 4. Cystic areas in the bilateral kidneys, some areas indeterminate based on density values. Slight enlargement of the largest area on the RIGHT that previously measured water density now with 45 Hounsfield units. Some of these areas could represent small solid renal lesions though many appeared simple in terms of density on prior imaging. Consider follow-up renal sonogram or multiphase CT when the patient is able. 5. Small pleural effusions in the chest, associated with septal  thickening at the lung bases could reflect mild pulmonary edema. 6. Cardiomegaly with signs of mitral annular and aortic valvular calcification. 7. Aortic atherosclerosis. Coronary artery disease and valvular disease. These results were called by telephone at the time of interpretation on 10/26/2020 at 1:29 pm to provider [facility] , who verbally acknowledged these results. Aortic Atherosclerosis (ICD10-I70.0). Electronically Signed   By: Zetta Bills M.D.   On: 10/26/2020 13:30   DG Chest Portable 1 View  Result Date: 10/26/2020 CLINICAL DATA:  Shortness of breath and back pain EXAM: PORTABLE CHEST 1 VIEW COMPARISON:  07/13/2020 FINDINGS: Cardiac shadow is enlarged but stable. Postsurgical changes are again seen. Lungs are well aerated bilaterally. Mild vascular congestion is noted but stable from the prior exam. No significant edema or focal infiltrate is noted. No bony abnormality is seen. IMPRESSION: Mild vascular congestion stable from the prior exam. Electronically Signed   By: Inez Catalina M.D.   On: 10/26/2020 15:17    Review Of Systems Constitutional: No fever, chills, weight loss or gain. Eyes: No vision change, wears glasses. No discharge or pain. Ears: No hearing loss, No tinnitus. Respiratory: No asthma, COPD, pneumonias. Positive shortness of breath. No hemoptysis. Cardiovascular: No chest pain, palpitation, leg edema. Gastrointestinal: Positive nausea, vomiting, diarrhea, constipation. Positive GI bleed. No hepatitis. Genitourinary: No dysuria, hematuria, kidney stone. No incontinance. Neurological: No headache, stroke, seizures.  Psychiatry: No psych facility admission for anxiety, depression, suicide. No detox. Skin: No rash. Musculoskeletal: Positive joint pain, fibromyalgia, neck pain, back pain. Lymphadenopathy: No lymphadenopathy. Hematology: Positive anemia, No easy bruising.   Blood pressure (!) 148/83, pulse 92, temperature 97.6 F (36.4 C), temperature source  Oral, resp. rate 15, height 5\' 3"  (1.6 m), weight 72.9 kg, SpO2 95 %. Body mass index is 28.47 kg/m. General appearance: alert, cooperative, appears stated age and mild distress from abdominal pain. Head: Normocephalic, atraumatic. Eyes: Brown eyes, pale conjunctiva, corneas clear. PERRL, EOM's intact. Neck: No adenopathy, no carotid bruit, no JVD, supple, symmetrical, trachea midline and thyroid not enlarged. Resp: Clear to auscultation bilaterally. Cardio: Irregular rate and rhythm, S1, S2 normal, II/VI systolic murmur, no click, rub or gallop GI: Soft, non-tender; bowel sounds normal; no organomegaly. Extremities: No edema, cyanosis or clubbing. Skin: Warm and dry.  Neurologic: Alert and oriented X 3, normal strength. Normal coordination.  Assessment/Plan Abdominal pain Left adrenal mass Pelvic mass Retroperitoneal bleed Chronic atrial fibrillation HTN CKD, IV Tobacco use disorder Hypertension  Add B-blocker for heart rate control along with IV diltiazem. Echocardiogram for LV function. Re-consult Urology/Surgery and GYN. Analgesics as tolerated.  Time spent: Review of old records, Lab, x-rays, EKG, other cardiac tests, examination, discussion with patient, nujrse and referring doctor/resident over 70 minutes.  [name], MD  10/26/2020, 6:47 PM

## 2020-10-26 NOTE — ED Notes (Signed)
Dinner Tray Ordered @ 1704. 

## 2020-10-27 ENCOUNTER — Inpatient Hospital Stay (HOSPITAL_COMMUNITY): Payer: Medicare Other

## 2020-10-27 ENCOUNTER — Other Ambulatory Visit: Payer: Self-pay

## 2020-10-27 ENCOUNTER — Encounter (HOSPITAL_COMMUNITY): Payer: Self-pay | Admitting: Internal Medicine

## 2020-10-27 DIAGNOSIS — D62 Acute posthemorrhagic anemia: Secondary | ICD-10-CM | POA: Diagnosis present

## 2020-10-27 DIAGNOSIS — N184 Chronic kidney disease, stage 4 (severe): Secondary | ICD-10-CM | POA: Diagnosis present

## 2020-10-27 DIAGNOSIS — E278 Other specified disorders of adrenal gland: Secondary | ICD-10-CM | POA: Diagnosis present

## 2020-10-27 LAB — CBC
HCT: 24.1 % — ABNORMAL LOW (ref 36.0–46.0)
HCT: 26.9 % — ABNORMAL LOW (ref 36.0–46.0)
HCT: 28.8 % — ABNORMAL LOW (ref 36.0–46.0)
Hemoglobin: 7.4 g/dL — ABNORMAL LOW (ref 12.0–15.0)
Hemoglobin: 8.1 g/dL — ABNORMAL LOW (ref 12.0–15.0)
Hemoglobin: 9 g/dL — ABNORMAL LOW (ref 12.0–15.0)
MCH: 30.2 pg (ref 26.0–34.0)
MCH: 30.7 pg (ref 26.0–34.0)
MCH: 31 pg (ref 26.0–34.0)
MCHC: 30.1 g/dL (ref 30.0–36.0)
MCHC: 30.7 g/dL (ref 30.0–36.0)
MCHC: 31.3 g/dL (ref 30.0–36.0)
MCV: 100.4 fL — ABNORMAL HIGH (ref 80.0–100.0)
MCV: 100.8 fL — ABNORMAL HIGH (ref 80.0–100.0)
MCV: 98.3 fL (ref 80.0–100.0)
Platelets: 228 10*3/uL (ref 150–400)
Platelets: 229 10*3/uL (ref 150–400)
Platelets: 235 10*3/uL (ref 150–400)
RBC: 2.39 MIL/uL — ABNORMAL LOW (ref 3.87–5.11)
RBC: 2.68 MIL/uL — ABNORMAL LOW (ref 3.87–5.11)
RBC: 2.93 MIL/uL — ABNORMAL LOW (ref 3.87–5.11)
RDW: 18.1 % — ABNORMAL HIGH (ref 11.5–15.5)
RDW: 18.5 % — ABNORMAL HIGH (ref 11.5–15.5)
RDW: 18.6 % — ABNORMAL HIGH (ref 11.5–15.5)
WBC: 15.7 10*3/uL — ABNORMAL HIGH (ref 4.0–10.5)
WBC: 16.3 10*3/uL — ABNORMAL HIGH (ref 4.0–10.5)
WBC: 24.5 10*3/uL — ABNORMAL HIGH (ref 4.0–10.5)
nRBC: 0 % (ref 0.0–0.2)
nRBC: 0.1 % (ref 0.0–0.2)
nRBC: 0.2 % (ref 0.0–0.2)

## 2020-10-27 LAB — ECHOCARDIOGRAM COMPLETE
AR max vel: 1.4 cm2
AV Area VTI: 1.36 cm2
AV Area mean vel: 1.33 cm2
AV Mean grad: 11.3 mmHg
AV Peak grad: 22.9 mmHg
Ao pk vel: 2.39 m/s
Area-P 1/2: 3.42 cm2
Height: 63 in
S' Lateral: 3.2 cm
Weight: 2440.93 oz

## 2020-10-27 LAB — COMPREHENSIVE METABOLIC PANEL
ALT: 10 U/L (ref 0–44)
AST: 23 U/L (ref 15–41)
Albumin: 3.1 g/dL — ABNORMAL LOW (ref 3.5–5.0)
Alkaline Phosphatase: 85 U/L (ref 38–126)
Anion gap: 12 (ref 5–15)
BUN: 34 mg/dL — ABNORMAL HIGH (ref 8–23)
CO2: 21 mmol/L — ABNORMAL LOW (ref 22–32)
Calcium: 9.6 mg/dL (ref 8.9–10.3)
Chloride: 110 mmol/L (ref 98–111)
Creatinine, Ser: 1.93 mg/dL — ABNORMAL HIGH (ref 0.44–1.00)
GFR, Estimated: 28 mL/min — ABNORMAL LOW (ref >60)
Glucose, Bld: 142 mg/dL — ABNORMAL HIGH (ref 70–99)
Potassium: 3.7 mmol/L (ref 3.5–5.1)
Sodium: 143 mmol/L (ref 135–145)
Total Bilirubin: 1 mg/dL (ref 0.3–1.2)
Total Protein: 5.7 g/dL — ABNORMAL LOW (ref 6.5–8.1)

## 2020-10-27 LAB — APTT: aPTT: 27 seconds (ref 24–36)

## 2020-10-27 LAB — PREPARE RBC (CROSSMATCH)

## 2020-10-27 LAB — PROTIME-INR
INR: 1.3 — ABNORMAL HIGH (ref 0.8–1.2)
Prothrombin Time: 16.1 seconds — ABNORMAL HIGH (ref 11.4–15.2)

## 2020-10-27 MED ORDER — METOPROLOL TARTRATE 50 MG PO TABS: 50.0000 mg | ORAL_TABLET | Freq: Two times a day (BID) | ORAL | Status: DC

## 2020-10-27 MED ORDER — SODIUM CHLORIDE 0.9% IV SOLUTION: Freq: Once | INTRAVENOUS | Status: AC

## 2020-10-27 MED ORDER — DILTIAZEM HCL 60 MG PO TABS: 60.0000 mg | ORAL_TABLET | Freq: Three times a day (TID) | ORAL | Status: DC

## 2020-10-27 MED ORDER — DILTIAZEM HCL-DEXTROSE 125-5 MG/125ML-% IV SOLN (PREMIX)
5.0000 mg/h | INTRAVENOUS | Status: DC
  Administered 2020-10-27: 5 mg/h via INTRAVENOUS
  Filled 2020-10-27: qty 125

## 2020-10-27 MED ORDER — METOPROLOL TARTRATE 25 MG PO TABS
25.0000 mg | ORAL_TABLET | Freq: Two times a day (BID) | ORAL | Status: DC
  Administered 2020-10-27: 25 mg via ORAL
  Filled 2020-10-27: qty 1

## 2020-10-27 MED ORDER — PRAZOSIN HCL 2 MG PO CAPS
2.0000 mg | ORAL_CAPSULE | Freq: Three times a day (TID) | ORAL | Status: DC
  Administered 2020-10-27 – 2020-11-03 (×19): 2 mg via ORAL
  Filled 2020-10-27 (×20): qty 1

## 2020-10-27 MED ORDER — HYDROMORPHONE HCL 1 MG/ML IJ SOLN: 2.0000 mg | Freq: Once | INTRAMUSCULAR | Status: DC

## 2020-10-27 MED ORDER — ALPRAZOLAM 0.25 MG PO TABS
0.2500 mg | ORAL_TABLET | Freq: Three times a day (TID) | ORAL | Status: DC
  Administered 2020-10-27 – 2020-10-30 (×10): 0.25 mg via ORAL
  Filled 2020-10-27 (×11): qty 1

## 2020-10-27 NOTE — Consult Note (Signed)
Reason for Consult: Large Left Adrenal Neoplasm With Recurrent Bleed / Suspect Pheochromocytoma or Adrenocortical Carcinoma, Stage 4 Renal Insuficiency  Referring Physician: Luvenia Starch MD  Renee Griffin is an 64 y.o. female.   HPI:   1 - Large Left Adrenal Neoplasm With Recurrent Bleed / Suspect Pheochromocytoma or Adrenocortical Carcinoma - 15cm left adrenal neoplasm with mostly internal hemorrhage by ER CT 11/1. Similar episode 2019 managed conservatively. Endocrine eval 2019 with elevated metanephrines at 4X ULN and known refractory hypertension. Failed to follow up for management in 2019. Prior imaging with large aortic branch supply to mass.   2 - Stage 4 Renal Insufficiency - Cr 2s with GFR 25-30. No hydro on imaging x several, Known severe HTN x years.  PMH sig for DM2, HTN, Porcine Valve replacement (no anticaogulation follows Dr. Doylene Canard). No prior abd surgeries.   Today "Renee Griffin" is seen as urgent consult for above. Hgb dropping now to 7s and transfusion pending. BP now better controlled with meds since admit. Her presenting and ongoing symptom is severe left flank / UQ pain.   Past Medical History:  Diagnosis Date  . Depression   . Diabetes mellitus without complication ([place])   . Heart disease, congenital   . High cholesterol   . Hypertension   . Panic attack   . Stroke [facility])     Past Surgical History:  Procedure Laterality Date  . CARDIAC SURGERY  2011   CABG  . COLONOSCOPY WITH PROPOFOL N/A 02/08/2018   Procedure: COLONOSCOPY WITH PROPOFOL;  Surgeon: Otis Brace, MD;  Location: [facility];  Service: Gastroenterology;  Laterality: N/A;  . COLONOSCOPY WITH PROPOFOL N/A 12/16/2018   Procedure: COLONOSCOPY WITH PROPOFOL;  Surgeon: Ronald Lobo, MD;  Location: [facility];  Service: Endoscopy;  Laterality: N/A;  . DILATION AND EVACUATION     Patient states she had an abortion 30 or 40 years ago  . ESOPHAGOGASTRODUODENOSCOPY (EGD) WITH PROPOFOL Left  02/05/2018   Procedure: ESOPHAGOGASTRODUODENOSCOPY (EGD) WITH PROPOFOL;  Surgeon: Otis Brace, MD;  Location: [facility];  Service: Gastroenterology;  Laterality: Left;  . IR ANGIOGRAM SELECTIVE EACH ADDITIONAL VESSEL  12/18/2018  . IR ANGIOGRAM SELECTIVE EACH ADDITIONAL VESSEL  12/18/2018  . IR ANGIOGRAM SELECTIVE EACH ADDITIONAL VESSEL  12/18/2018  . IR ANGIOGRAM SELECTIVE EACH ADDITIONAL VESSEL  12/18/2018  . IR ANGIOGRAM VISCERAL SELECTIVE  12/18/2018  . IR EMBO ART  VEN HEMORR LYMPH EXTRAV  INC GUIDE ROADMAPPING  12/18/2018  . IR US GUIDE VASC ACCESS RIGHT  12/18/2018  . LEFT HEART CATH AND CORONARY ANGIOGRAPHY N/A 02/10/2020   Procedure: LEFT HEART CATH AND CORONARY ANGIOGRAPHY;  Surgeon: Dixie Dials, MD;  Location: [facility];  Service: Cardiovascular;  Laterality: N/A;  . WISDOM TOOTH EXTRACTION      Family History  Problem Relation Age of Onset  . Cancer Mother        uterian OR cervical  . Cancer Father        lung  . Leukemia Brother   . Cancer Sister        breast    Social History:  reports that she has been smoking cigarettes. She has a 50.00 pack-year smoking history. She has never used smokeless tobacco. She reports that she does not drink alcohol and does not use drugs.  Allergies:  Allergies  Allergen Reactions  . Amoxicillin-Pot Clavulanate Diarrhea    Has patient had a PCN reaction causing immediate rash, facial/tongue/throat swelling, SOB or lightheadedness with hypotension: Unknown Has patient  had a PCN reaction causing severe rash involving mucus membranes or skin necrosis:UnknoWN Has patient had a PCN reaction that required hospitalization: Unknown Has patient had a PCN reaction occurring within the last 10 years: Unknown If all of the above answers are "NO", then may proceed with Cephalosporin use.   . Bupropion Nausea Only  . Nicotine Nausea Only and Rash    Reaction to patches    Medications: I have reviewed the patient's current  medications.  Results for orders placed or performed during the hospital encounter of 10/26/20 (from the past 48 hour(s))  Lipase, blood     Status: None   Collection Time: 10/26/20  8:46 AM  Result Value Ref Range   Lipase 22 11 - 51 U/L    Comment: Performed at [facility], [address] [place], [place] [address]  Comprehensive metabolic panel     Status: Abnormal   Collection Time: 10/26/20  8:46 AM  Result Value Ref Range   Sodium 141 135 - 145 mmol/L   Potassium 3.6 3.5 - 5.1 mmol/L   Chloride 108 98 - 111 mmol/L   CO2 17 (L) 22 - 32 mmol/L   Glucose, Bld 163 (H) 70 - 99 mg/dL    Comment: Glucose reference range applies only to samples taken after fasting for at least 8 hours.   BUN 30 (H) 8 - 23 mg/dL   Creatinine, Ser 1.96 (H) 0.44 - 1.00 mg/dL   Calcium 10.2 8.9 - 10.3 mg/dL   Total Protein 6.4 (L) 6.5 - 8.1 g/dL   Albumin 3.4 (L) 3.5 - 5.0 g/dL   AST 18 15 - 41 U/L   ALT 9 0 - 44 U/L   Alkaline Phosphatase 103 38 - 126 U/L   Total Bilirubin 1.0 0.3 - 1.2 mg/dL   GFR, Estimated 28 (L) >60 mL/min    Comment: (NOTE) Calculated using the CKD-EPI Creatinine Equation (2021)    Anion gap 16 (H) 5 - 15    Comment: Performed at [facility], [place] [address] [place], [place] [address]  CBC     Status: Abnormal   Collection Time: 10/26/20  8:46 AM  Result Value Ref Range   WBC 13.4 (H) 4.0 - 10.5 K/uL   RBC 3.93 3.87 - 5.11 MIL/uL   Hemoglobin 11.8 (L) 12.0 - 15.0 g/dL   HCT 38.9 36 - 46 %   MCV 99.0 80.0 - 100.0 fL   MCH 30.0 26.0 - 34.0 pg   MCHC 30.3 30.0 - 36.0 g/dL   RDW 18.2 (H) 11.5 - 15.5 %   Platelets 346 150 - 400 K/uL   nRBC 0.0 0.0 - 0.2 %    Comment: Performed at [facility], [place] [address], [place], [place] [address]  Troponin I (High Sensitivity)     Status: Abnormal   Collection Time: 10/26/20 12:20 PM  Result Value Ref Range   Troponin I (High Sensitivity) 46 (H) <18 ng/L    Comment: (NOTE) Elevated high sensitivity troponin I  (hsTnI) values and significant  changes across serial measurements may suggest ACS but many other  chronic and acute conditions are known to elevate hsTnI results.  Refer to the "Links" section for chest pain algorithms and additional  guidance. Performed at [facility], [place] [address], [place], [place] [address]   Urinalysis, Routine w reflex microscopic Urine, Clean Catch     Status: Abnormal   Collection Time: 10/26/20  1:00 PM  Result Value Ref Range   Color, Urine YELLOW YELLOW   APPearance CLEAR CLEAR   Specific Gravity, Urine 1.014 1.005 - 1.030   pH 5.0 5.0 - 8.0   Glucose, UA NEGATIVE NEGATIVE mg/dL   Hgb urine dipstick MODERATE (A) NEGATIVE   Bilirubin Urine NEGATIVE NEGATIVE   Ketones, ur NEGATIVE NEGATIVE mg/dL   Protein, ur 100 (A) NEGATIVE mg/dL   Nitrite NEGATIVE NEGATIVE   Leukocytes,Ua TRACE (A) NEGATIVE   RBC / HPF 0-5 0 - 5 RBC/hpf   WBC, UA 0-5 0 - 5 WBC/hpf   Bacteria, UA NONE SEEN NONE SEEN   Squamous Epithelial / LPF 0-5 0 - 5    Comment: Performed at [facility], [place] [address] [place], [place] [address]  Resp Panel by RT PCR (RSV, Flu A&B, Covid) - Nasopharyngeal Swab     Status: None   Collection Time: 10/26/20  1:49 PM   Specimen: Nasopharyngeal Swab  Result Value Ref Range   SARS Coronavirus 2 by RT PCR NEGATIVE NEGATIVE    Comment: (NOTE) SARS-CoV-2 target nucleic acids are NOT DETECTED.  The SARS-CoV-2 RNA is generally detectable in upper respiratoy specimens during the acute phase of infection. The lowest concentration of SARS-CoV-2 viral copies this assay can detect is 131 copies/mL. A negative result does not preclude SARS-Cov-2 infection and should not be used as the sole basis for treatment or other patient management decisions. A negative result may occur with  improper specimen collection/handling, submission of specimen other than nasopharyngeal swab, presence of viral mutation(s) within the areas targeted by this assay,  and inadequate number of viral copies (<131 copies/mL). A negative result must be combined with clinical observations, patient history, and epidemiological information. The expected result is Negative.  Fact Sheet for Patients:  PinkCheek.be  Fact Sheet for Healthcare Providers:  GravelBags.it  This test is no t yet approved or cleared by the [place] FDA and  has been authorized for detection and/or diagnosis of SARS-CoV-2 by FDA under an Emergency Use Authorization (EUA). This EUA will remain  in effect (meaning this test can be used) for the duration of the COVID-19 declaration under Section 564(b)(1) of the Act, 21 U.S.C. section 360bbb-3(b)(1), unless the authorization is terminated or revoked sooner.     Influenza A by PCR NEGATIVE NEGATIVE   Influenza B by PCR NEGATIVE NEGATIVE    Comment: (NOTE) The Xpert Xpress SARS-CoV-2/FLU/RSV assay is intended as an aid in  the diagnosis of influenza from Nasopharyngeal swab specimens and  should not be used as a sole basis for treatment. Nasal washings and  aspirates are unacceptable for Xpert Xpress SARS-CoV-2/FLU/RSV  testing.  Fact Sheet for Patients: PinkCheek.be  Fact Sheet for Healthcare Providers: GravelBags.it  This test is not yet approved or cleared by the [place] FDA and  has been authorized for detection and/or diagnosis of SARS-CoV-2 by  FDA under an Emergency Use Authorization (EUA). This EUA will remain  in effect (meaning this test can be used) for the duration of the  Covid-19 declaration under Section 564(b)(1) of the Act, 21  U.S.C. section 360bbb-3(b)(1), unless the authorization is  terminated or revoked.    Respiratory Syncytial Virus by PCR NEGATIVE NEGATIVE    Comment: (NOTE) Fact Sheet for Patients: PinkCheek.be  Fact Sheet for Healthcare  Providers: GravelBags.it  This test is not yet approved or cleared by the [place] FDA and  has been authorized for detection and/or diagnosis of SARS-CoV-2 by  FDA  under an Emergency Use Authorization (EUA). This EUA will remain  in effect (meaning this test can be used) for the duration of the  COVID-19 declaration under Section 564(b)(1) of the Act, 21 U.S.C.  section 360bbb-3(b)(1), unless the authorization is terminated or  revoked. Performed at [facility], [place] [address] [place], [place] [address]   Troponin I (High Sensitivity)     Status: Abnormal   Collection Time: 10/26/20  1:49 PM  Result Value Ref Range   Troponin I (High Sensitivity) 86 (H) <18 ng/L    Comment: RESULT CALLED TO, READ BACK BY AND VERIFIED WITH: HANNIE VONKRETCHMAR,RN AT 1455 10/26/2020 BY ZBEECH. (NOTE) Elevated high sensitivity troponin I (hsTnI) values and significant  changes across serial measurements may suggest ACS but many other  chronic and acute conditions are known to elevate hsTnI results.  Refer to the Links section for chest pain algorithms and additional  guidance. Performed at [facility], [place] [address] [place], [place] [address]   Type and screen [place]     Status: None (Preliminary result)   Collection Time: 10/26/20  2:28 PM  Result Value Ref Range   ABO/RH(D) O POS    Antibody Screen NEG    Sample Expiration 10/29/2020,2359    Unit Number [id number]    Blood Component Type RED CELLS,LR    Unit division 00    Status of Unit ISSUED    Transfusion Status OK TO TRANSFUSE    Crossmatch Result      Compatible Performed at [facility], [place] [address], [place], [place] [address]   I-stat chem 8, ED (not at [facility] or [facility])     Status: Abnormal   Collection Time: 10/26/20  3:18 PM  Result Value Ref Range   Sodium 143 135 - 145 mmol/L   Potassium 3.7 3.5 - 5.1 mmol/L   Chloride 109 98 - 111 mmol/L   BUN  41 (H) 8 - 23 mg/dL   Creatinine, Ser 2.00 (H) 0.44 - 1.00 mg/dL   Glucose, Bld 178 (H) 70 - 99 mg/dL    Comment: Glucose reference range applies only to samples taken after fasting for at least 8 hours.   Calcium, Ion 1.24 1.15 - 1.40 mmol/L   TCO2 22 22 - 32 mmol/L   Hemoglobin 10.2 (L) 12.0 - 15.0 g/dL   HCT 30.0 (L) 36 - 46 %  CBC     Status: Abnormal   Collection Time: 10/26/20  3:41 PM  Result Value Ref Range   WBC 17.7 (H) 4.0 - 10.5 K/uL   RBC 2.98 (L) 3.87 - 5.11 MIL/uL   Hemoglobin 9.3 (L) 12.0 - 15.0 g/dL   HCT 30.5 (L) 36 - 46 %   MCV 102.3 (H) 80.0 - 100.0 fL   MCH 31.2 26.0 - 34.0 pg   MCHC 30.5 30.0 - 36.0 g/dL   RDW 18.3 (H) 11.5 - 15.5 %   Platelets 293 150 - 400 K/uL   nRBC 0.0 0.0 - 0.2 %    Comment: Performed at [place] [address] [place], [place] [address]  Digoxin level     Status: Abnormal   Collection Time: 10/26/20  5:30 PM  Result Value Ref Range   Digoxin Level 0.5 (L) 1.0 - 2.0 ng/mL    Comment: Performed at [facility], [place] [address], [place],  [address]  CBC     Status: Abnormal  Collection Time: 10/27/20 12:23 AM  Result Value Ref Range   WBC 16.3 (H) 4.0 - 10.5 K/uL   RBC 2.68 (L) 3.87 - 5.11 MIL/uL   Hemoglobin 8.1 (L) 12.0 - 15.0 g/dL   HCT 26.9 (L) 36 - 46 %   MCV 100.4 (H) 80.0 - 100.0 fL   MCH 30.2 26.0 - 34.0 pg   MCHC 30.1 30.0 - 36.0 g/dL   RDW 18.6 (H) 11.5 - 15.5 %   Platelets 235 150 - 400 K/uL   nRBC 0.0 0.0 - 0.2 %    Comment: Performed at [facility] [address] [place], [place] [address]  Comprehensive metabolic panel     Status: Abnormal   Collection Time: 10/27/20 12:23 AM  Result Value Ref Range   Sodium 143 135 - 145 mmol/L   Potassium 3.7 3.5 - 5.1 mmol/L   Chloride 110 98 - 111 mmol/L   CO2 21 (L) 22 - 32 mmol/L   Glucose, Bld 142 (H) 70 - 99 mg/dL    Comment: Glucose reference range applies only to samples taken after fasting for at least 8 hours.   BUN 34 (H) 8 - 23  mg/dL   Creatinine, Ser 1.93 (H) 0.44 - 1.00 mg/dL   Calcium 9.6 8.9 - 10.3 mg/dL   Total Protein 5.7 (L) 6.5 - 8.1 g/dL   Albumin 3.1 (L) 3.5 - 5.0 g/dL   AST 23 15 - 41 U/L   ALT 10 0 - 44 U/L   Alkaline Phosphatase 85 38 - 126 U/L   Total Bilirubin 1.0 0.3 - 1.2 mg/dL   GFR, Estimated 28 (L) >60 mL/min    Comment: (NOTE) Calculated using the CKD-EPI Creatinine Equation (2021)    Anion gap 12 5 - 15    Comment: Performed at [facility], [place] [address], [place], [place] [address]  Protime-INR     Status: Abnormal   Collection Time: 10/27/20 12:23 AM  Result Value Ref Range   Prothrombin Time 16.1 (H) 11.4 - 15.2 seconds   INR 1.3 (H) 0.8 - 1.2    Comment: (NOTE) INR goal varies based on device and disease states. Performed at [facility], [place] [address], [place], [place] [address]   APTT     Status: None   Collection Time: 10/27/20 12:23 AM  Result Value Ref Range   aPTT 27 24 - 36 seconds    Comment: Performed at [facility] [address] [place], [place] [address]  CBC     Status: Abnormal   Collection Time: 10/27/20  4:03 AM  Result Value Ref Range   WBC 15.7 (H) 4.0 - 10.5 K/uL   RBC 2.39 (L) 3.87 - 5.11 MIL/uL   Hemoglobin 7.4 (L) 12.0 - 15.0 g/dL   HCT 24.1 (L) 36 - 46 %   MCV 100.8 (H) 80.0 - 100.0 fL   MCH 31.0 26.0 - 34.0 pg   MCHC 30.7 30.0 - 36.0 g/dL   RDW 18.5 (H) 11.5 - 15.5 %   Platelets 228 150 - 400 K/uL   nRBC 0.1 0.0 - 0.2 %    Comment: Performed at [facility], [place] [address], [place], [place] [address]  Prepare RBC (crossmatch)     Status: None   Collection Time: 10/27/20  1:00 PM  Result Value Ref Range   Order Confirmation      ORDER PROCESSED BY BLOOD BANK Performed at [facility], [place] [address], [place], [place] [address]     CT ABDOMEN PELVIS WO CONTRAST  Result Date: 10/26/2020 CLINICAL DATA:  Acute nonlocalized LEFT flank pain, LEFT-sided pain EXAM: CT ABDOMEN AND PELVIS WITHOUT CONTRAST  TECHNIQUE: Multidetector CT imaging of the abdomen and pelvis was performed following the standard protocol without IV contrast. COMPARISON:  12/25/2018 FINDINGS: Lower chest: Mild septal thickening at the lung bases. No dense consolidation. No pleural effusion. Heart size is enlarged with signs of mitral annular and aortic valvular calcification. No pericardial effusion. Small pleural effusions in the chest. Trace on the LEFT and trace on the RIGHT slightly greater on the RIGHT than the LEFT. Hepatobiliary: Normal appearance of the liver on noncontrast imaging. No pericholecystic stranding. Pancreas: Pancreas splayed over a large LEFT adrenal mass, indistinct margins in the setting of periadrenal hemorrhage. Spleen: Stable low-density lesion in the spleen. Adrenals/Urinary Tract: Lobular low-attenuation lesion in the RIGHT adrenal gland is unchanged measuring approximately 2.4 x 2.4 cm. LEFT adrenal lesion with heterogeneity, calcification and enlargement since the previous imaging study 13 x 13 cm previously approximately 10 cm greatest axial dimension. This measures approximately 14 cm greatest craniocaudal dimension previously approximately 10 cm. Waves of variable density extend from the lesion which displaces the kidney inferiorly and bowel loops and pancreas anteriorly with much more mass effect upon the pancreas and LEFT upper quadrant structures than on the prior study. Cystic areas in the bilateral kidneys, some areas indeterminate based on density values, for instance LEFT kidney with a 1.7 x 1.6 cm area measuring 42 Hounsfield units (image 53, series 3) nephrolithiasis on the LEFT. This is stable compared to December of 2019. Slight enlargement of the largest area on the RIGHT that previously measured water density now with 45 Hounsfield unit density on image 44 of series 3. Stable appearance of intermediate density lesion in the interpolar RIGHT kidney on image 42 of series 3 No nephrolithiasis on the  RIGHT. 6 mm calculus in the interpolar LEFT kidney and vascular calcifications in the LEFT kidney as well. Urinary bladder under distended limiting assessment. Stomach/Bowel: Mass effect upon bowel loops in the upper abdomen. No signs of bowel obstruction or acute bowel process. Vascular/Lymphatic: Calcified atheromatous plaque of the abdominal aorta. No aneurysmal dilation. There is no gastrohepatic or hepatoduodenal ligament lymphadenopathy. No retroperitoneal or mesenteric lymphadenopathy. No pelvic sidewall lymphadenopathy. Reproductive: Cystic lesion in the pelvis measuring 10 x 7 cm when measured in a similar fashion approximately 8 x 6 cm. Other: Small volume ascites. Density between 10 and 20 Hounsfield units throughout the abdomen. Musculoskeletal: No acute bone finding. No destructive bone process. IMPRESSION: 1. Interval enlargement of the LEFT adrenal mass with heterogeneity, calcification with enlargement since the previous imaging study. This lesion displaces the kidney inferiorly and bowel loops and pancreas anteriorly with much more mass effect upon the pancreas and LEFT upper quadrant structures than on the prior study. Hemorrhage into existing adrenal neoplasm and into adjacent retroperitoneum, mixed density with areas of high density could be seen in the setting of ongoing bleeding. Given size and appearance adrenal cortical carcinoma and pheochromocytoma are considered. Biochemical correlation and endocrine and surgical consultation may be helpful 2. Ascites in the abdomen and pelvis. Density values between 10 and 20 Hounsfield units, small amount of blood in the peritoneum is possible though most all of the hyperdense material appears to be either periadrenal or in the anterior pararenal space in the LEFT hemiabdomen. 3. Cystic lesion in the pelvis measuring 10  x 7 cm when measured in a similar fashion approximately 8 x 6 cm. Indolent neoplasm could have this appearance. This is concerning for  low-grade cystic neoplasm. Recommend GYN consult, and consider pelvis MRI w/o and w/ contrast if clinically warranted. Note: This recommendation does not apply to premenarchal patients and to those with increased risk (genetic, family history, elevated tumor markers or other high-risk factors) of ovarian cancer. Reference: JACR 2020 Feb; 17(2):248-254 4. Cystic areas in the bilateral kidneys, some areas indeterminate based on density values. Slight enlargement of the largest area on the RIGHT that previously measured water density now with 45 Hounsfield units. Some of these areas could represent small solid renal lesions though many appeared simple in terms of density on prior imaging. Consider follow-up renal sonogram or multiphase CT when the patient is able. 5. Small pleural effusions in the chest, associated with septal thickening at the lung bases could reflect mild pulmonary edema. 6. Cardiomegaly with signs of mitral annular and aortic valvular calcification. 7. Aortic atherosclerosis. Coronary artery disease and valvular disease. These results were called by telephone at the time of interpretation on 10/26/2020 at 1:29 pm to provider [facility] , who verbally acknowledged these results. Aortic Atherosclerosis (ICD10-I70.0). Electronically Signed   By: Zetta Bills M.D.   On: 10/26/2020 13:30   DG Chest Portable 1 View  Result Date: 10/26/2020 CLINICAL DATA:  Shortness of breath and back pain EXAM: PORTABLE CHEST 1 VIEW COMPARISON:  07/13/2020 FINDINGS: Cardiac shadow is enlarged but stable. Postsurgical changes are again seen. Lungs are well aerated bilaterally. Mild vascular congestion is noted but stable from the prior exam. No significant edema or focal infiltrate is noted. No bony abnormality is seen. IMPRESSION: Mild vascular congestion stable from the prior exam. Electronically Signed   By: Inez Catalina M.D.   On: 10/26/2020 15:17   ECHOCARDIOGRAM COMPLETE  Result Date: 10/27/2020     ECHOCARDIOGRAM REPORT   Patient Name:   Renee Griffin Date of Exam: 10/27/2020 Medical Rec #:  [id number]      Height:       63.0 in Accession #:    [id number]     Weight:       152.6 lb Date of Birth:  Jan 11, 1956      BSA:          1.723 m Patient Age:    44 years       BP:           118/68 mmHg Patient Gender: F              HR:           110 bpm. Exam Location:  Inpatient Procedure: 2D Echo, Cardiac Doppler and Color Doppler Indications:     I48.0 Paroxysmal atrial fibrillation  History:         Patient has prior history of Echocardiogram examinations, most                  recent 02/08/2020. Stroke; Risk Factors:Hypertension, Diabetes                  and Dyslipidemia.                  Aortic Valve: bioprosthetic valve is present in the aortic                  position.  Sonographer:     Tiffany Dance Referring Phys:  [place] Diagnosing Phys: Vicenta Aly  Doylene Canard MD IMPRESSIONS  1. Left ventricular ejection fraction, by estimation, is 50 to 55%. The left ventricle has low normal function. The left ventricle demonstrates regional wall motion abnormalities (see scoring diagram/findings for description). There is mild concentric left ventricular hypertrophy. Left ventricular diastolic parameters are indeterminate. There is moderate hypokinesis of the left ventricular, basal inferoseptal wall and inferior wall.  2. Right ventricular systolic function is normal. The right ventricular size is normal. There is normal pulmonary artery systolic pressure.  3. Left atrial size was severely dilated.  4. Right atrial size was severely dilated.  5. The mitral valve is degenerative. Moderate to severe mitral valve regurgitation. Moderate to severe mitral annular calcification.  6. Tricuspid valve regurgitation is moderate to severe.  7. The aortic valve is tricuspid. There is mild calcification of the aortic valve. There is mild thickening of the aortic valve. Aortic valve regurgitation is trivial. Mild aortic valve stenosis.  There is a bioprosthetic valve present in the aortic position.  8. There is mild (Grade II) atheroma plaque.  9. The inferior vena cava is normal in size with greater than 50% respiratory variability, suggesting right atrial pressure of 3 mmHg. FINDINGS  Left Ventricle: Left ventricular ejection fraction, by estimation, is 50 to 55%. The left ventricle has low normal function. The left ventricle demonstrates regional wall motion abnormalities. Moderate hypokinesis of the left ventricular, basal inferoseptal wall and inferior wall. The left ventricular internal cavity size was normal in size. There is mild concentric left ventricular hypertrophy. Left ventricular diastolic parameters are indeterminate.  LV Wall Scoring: The basal inferolateral segment, basal inferior segment, and basal inferoseptal segment are hypokinetic. The entire anterior wall, antero-lateral wall, mid and distal lateral wall, entire anterior septum, mid and distal inferior wall, and mid inferoseptal segment are normal. Right Ventricle: The right ventricular size is normal. No increase in right ventricular wall thickness. Right ventricular systolic function is normal. There is normal pulmonary artery systolic pressure. The tricuspid regurgitant velocity is 2.86 m/s, and  with an assumed right atrial pressure of 3 mmHg, the estimated right ventricular systolic pressure is 56.3 mmHg. Left Atrium: Left atrial size was severely dilated. Right Atrium: Right atrial size was severely dilated. Pericardium: There is no evidence of pericardial effusion. Mitral Valve: The mitral valve is degenerative in appearance. There is moderate thickening of the mitral valve leaflet(s). There is moderate calcification of the mitral valve leaflet(s). Mildly decreased mobility of the mitral valve leaflets. Moderate to  severe mitral annular calcification. Moderate to severe mitral valve regurgitation. Tricuspid Valve: The tricuspid valve is normal in structure. Tricuspid  valve regurgitation is moderate to severe. Aortic Valve: The aortic valve is tricuspid. There is mild calcification of the aortic valve. There is mild thickening of the aortic valve. There is mild aortic valve annular calcification. Aortic valve regurgitation is trivial. Mild aortic stenosis is present. Aortic valve mean gradient measures 11.3 mmHg. Aortic valve peak gradient measures 22.9 mmHg. Aortic valve area, by VTI measures 1.36 cm. There is a bioprosthetic valve present in the aortic position. Pulmonic Valve: The pulmonic valve was normal in structure. Pulmonic valve regurgitation is not visualized. Aorta: The aortic root is normal in size and structure. There is mild (Grade II) atheroma plaque. Venous: The inferior vena cava is normal in size with greater than 50% respiratory variability, suggesting right atrial pressure of 3 mmHg. IAS/Shunts: The interatrial septum was not assessed.  LEFT VENTRICLE PLAX 2D LVIDd:         4.40  cm LVIDs:         3.20 cm LV PW:         1.20 cm LV IVS:        1.30 cm LVOT diam:     2.10 cm LV SV:         53 LV SV Index:   31 LVOT Area:     3.46 cm  RIGHT VENTRICLE          IVC RV Basal diam:  3.10 cm  IVC diam: 1.90 cm RV Mid diam:    2.00 cm TAPSE (M-mode): 1.5 cm LEFT ATRIUM              Index       RIGHT ATRIUM           Index LA diam:        4.70 cm  2.73 cm/m  RA Area:     24.80 cm LA Vol (A2C):   95.2 ml  55.24 ml/m RA Volume:   75.90 ml  44.04 ml/m LA Vol (A4C):   106.0 ml 61.50 ml/m LA Biplane Vol: 104.0 ml 60.34 ml/m  AORTIC VALVE AV Area (Vmax):    1.40 cm AV Area (Vmean):   1.33 cm AV Area (VTI):     1.36 cm AV Vmax:           239.33 cm/s AV Vmean:          158.333 cm/s AV VTI:            0.387 m AV Peak Grad:      22.9 mmHg AV Mean Grad:      11.3 mmHg LVOT Vmax:         96.40 cm/s LVOT Vmean:        60.800 cm/s LVOT VTI:          0.152 m LVOT/AV VTI ratio: 0.39  AORTA Ao Root diam: 3.90 cm Ao Asc diam:  2.70 cm MITRAL VALVE                TRICUSPID VALVE  MV Area (PHT): 3.42 cm     TR Peak grad:   32.7 mmHg MV Decel Time: 222 msec     TR Vmax:        286.00 cm/s MV E velocity: 159.50 cm/s                             SHUNTS                             Systemic VTI:  0.15 m                             Systemic Diam: 2.10 cm Dixie Dials MD Electronically signed by Dixie Dials MD Signature Date/Time: 10/27/2020/2:28:27 PM    Final     Review of Systems Blood pressure 127/65, pulse 70, temperature 98.5 F (36.9 C), temperature source Oral, resp. rate 20, height 5\' 3"  (1.6 m), weight 69.2 kg, SpO2 100 %. Physical Exam  Assessment/Plan:  1 - Large Left Adrenal Neoplasm With Recurrent Bleed / Suspect Pheochromocytoma or Adrenocortical Carcinoma - difficult and dangerous problem in acute and chronic setting. Ideally, rec BP control especially with alpha blockers as msot effective given endocrine active adrenal neoplasm, PRN transfusion, and then elective left adrenalectomy in 6-12  weeks. Should her bleeding become transfusion refracotry, then either IR embolization of her likely dominant aortic branch adrenal vessel (risk of contrast nephropathy) or surgery this admission (possibly this Thursday afternoon) as next best options.   I will begin prazosin PO Q8 to begin alpha blockade as would make any surgery MUCH safer both in acute and elective setting.   Repeat metanephrines now.   2 - Stage 4 Renal Insufficiency - Medical renal disease likely form HTN (made worse from above) and DM2.  STRONGLY encouraged patient to follow up for these matters as they are life threatening chronically as well.   Alexis Frock 10/27/2020, 3:47 PM

## 2020-10-27 NOTE — Progress Notes (Signed)
Blood transfusion has started.

## 2020-10-27 NOTE — Consult Note (Signed)
Ref: Dixie Dials, MD   Subjective:  Awake. Poor oral intake. VS stable. HR in 90's. Left sided Abdominal pain continues. She is asking for alprazolam. Mild respiratory distress at rest.  Objective:  Vital Signs in the last 24 hours: Temp:  [97.6 F (36.4 C)-97.9 F (36.6 C)] 97.9 F (36.6 C) (11/02 0733) Pulse Rate:  [73-113] 94 (11/02 0733) Cardiac Rhythm: Atrial fibrillation (11/02 0700) Resp:  [11-32] 22 (11/02 0733) BP: (112-190)/(68-137) 151/137 (11/02 0733) SpO2:  [85 %-100 %] 98 % (11/02 0733) Weight:  [69.2 kg-72.9 kg] 69.2 kg (11/02 0305)  Physical Exam: BP Readings from Last 1 Encounters:  10/27/20 (!) 151/137     Wt Readings from Last 1 Encounters:  10/27/20 69.2 kg    Weight change:  Body mass index is 27.02 kg/m. HEENT: [place]/AT, Eyes-Blue, Conjunctiva-Pink, Sclera-Non-icteric Neck: No JVD, No bruit, Trachea midline. Lungs:  Clearing, Bilateral. Cardiac:  Irregular rhythm, normal S1 and S2, no S3. II/VI systolic murmur. Abdomen:  Soft, non-tender. BS present. Extremities:  No edema present. No cyanosis. No clubbing. CNS: AxOx3, Cranial nerves grossly intact, moves all 4 extremities.  Skin: Warm and dry.   Intake/Output from previous day: 11/01 0701 - 11/02 0700 In: 408.7 [P.O.:240; I.V.:28.7; IV Piggyback:140] Out: 100 [Urine:100]    Lab Results: BMET    Component Value Date/Time   NA 143 10/27/2020 0023   NA 143 10/26/2020 1518   NA 141 10/26/2020 0846   K 3.7 10/27/2020 0023   K 3.7 10/26/2020 1518   K 3.6 10/26/2020 0846   CL 110 10/27/2020 0023   CL 109 10/26/2020 1518   CL 108 10/26/2020 0846   CO2 21 (L) 10/27/2020 0023   CO2 17 (L) 10/26/2020 0846   CO2 18 (L) 08/24/2020 0919   GLUCOSE 142 (H) 10/27/2020 0023   GLUCOSE 178 (H) 10/26/2020 1518   GLUCOSE 163 (H) 10/26/2020 0846   BUN 34 (H) 10/27/2020 0023   BUN 41 (H) 10/26/2020 1518   BUN 30 (H) 10/26/2020 0846   CREATININE 1.93 (H) 10/27/2020 0023   CREATININE 2.00 (H)  10/26/2020 1518   CREATININE 1.96 (H) 10/26/2020 0846   CALCIUM 9.6 10/27/2020 0023   CALCIUM 10.2 10/26/2020 0846   CALCIUM 10.6 (H) 08/24/2020 0919   GFRNONAA 28 (L) 10/27/2020 0023   GFRNONAA 28 (L) 10/26/2020 0846   GFRNONAA 30 (L) 08/24/2020 0919   GFRNONAA 25 (L) 07/19/2020 0920   GFRNONAA 28 (L) 07/13/2020 0604   GFRAA 34 (L) 08/24/2020 0919   GFRAA 29 (L) 07/19/2020 0920   GFRAA 32 (L) 07/13/2020 0604   CBC    Component Value Date/Time   WBC 15.7 (H) 10/27/2020 0403   RBC 2.39 (L) 10/27/2020 0403   HGB 7.4 (L) 10/27/2020 0403   HCT 24.1 (L) 10/27/2020 0403   PLT 228 10/27/2020 0403   MCV 100.8 (H) 10/27/2020 0403   MCH 31.0 10/27/2020 0403   MCHC 30.7 10/27/2020 0403   RDW 18.5 (H) 10/27/2020 0403   LYMPHSABS 1.0 05/27/2020 0607   MONOABS 0.6 05/27/2020 0607   EOSABS 0.2 05/27/2020 0607   BASOSABS 0.1 05/27/2020 0607   HEPATIC Function Panel Recent Labs    08/24/20 0919 10/26/20 0846 10/27/20 0023  PROT 6.7 6.4* 5.7*   HEMOGLOBIN A1C No components found for: HGA1C,  MPG CARDIAC ENZYMES Lab Results  Component Value Date   WLSLHTD 428 (H) 02/01/2018   BNP No results for input(s): PROBNP in the last 8760 hours. TSH Recent Labs  01/19/20 1250 05/17/20 1734  TSH 2.243 1.710   CHOLESTEROL Recent Labs    02/08/20 0257  CHOL 202*    Scheduled Meds: . ALPRAZolam  0.25 mg Oral Q8H  . digoxin  0.125 mg Oral Q M,W,F  . gemfibrozil  600 mg Oral BID  .  HYDROmorphone (DILAUDID) injection  2 mg Intravenous Once  . metoprolol tartrate  25 mg Oral BID  . senna  1 tablet Oral BID  . sertraline  100 mg Oral QHS  . torsemide  20 mg Oral Q M,W,F   Continuous Infusions: . diltiazem (CARDIZEM) infusion 5 mg/hr (10/26/20 1705)   PRN Meds:.HYDROmorphone (DILAUDID) injection, oxyCODONE  Assessment/Plan: Chronic atrial fibrillation Abdominal pain Left adrenal mass Pelvic mass Retroperitoneal bleed Anemia of blood loss and chronic disease CKD,  IV Tobacco use disorder Hypertension Anxiety  Change IV diltiazem to PO. Analgesics and alprazolam Awaiting echocardiogram for LV function.   LOS: 1 day   Time spent including chart review, lab review, examination, discussion with patient/nurse : 30 min   Dixie Dials  MD  10/27/2020, 9:50 AM

## 2020-10-27 NOTE — Progress Notes (Signed)
Spoke with Dr. Doylene Canard  to make patient aware of status bleed hold blood thinner resuming xanax 0.25mg  every 8 hours.

## 2020-10-27 NOTE — Plan of Care (Signed)
  Problem: Activity: Goal: Risk for activity intolerance will decrease Outcome: Progressing   Problem: Coping: Goal: Level of anxiety will decrease Outcome: Progressing   

## 2020-10-27 NOTE — Progress Notes (Signed)
Pt is in the bed moaning and groaning with left side pain with distended abdomen Was medicated by night time nurse. 02 3l n/c of 02 sating 100% RR-22 Blp-151/137 HR 98 on Cardizem 5mg /kg. Plan to give Dilaudid IV if Oxy IR does not relieve Plan and to make MD aware of status.

## 2020-10-27 NOTE — Progress Notes (Signed)
Subjective:  [name] is a 64 year old female with past medical history of chronic systolic and diastolic heart failure, chronic atrial fibrillation (on Eliquis,) CAD s/p CABG, aortic stenosis s/p bioprosthetic aortic valve, prior CVA of left cerebellum and pons, HTN, HLD, COPD, CKD IV, anxiety, and current tobacco use disorder who presented to [facility] on 10/26/2020 for evaluation of left flank pain found to have enlargement of known left adrenal mass with hemorrhage into neoplasm and retroperitoneum.  Overnight, no acute events.  This morning, patient stating that the left side of her abdomen is very painful. She states that she did not receive pain medication overnight. She does not remember why she is in pain. We discussed thoroughly the imaging findings and her current condition. She reports understanding and has no further questions or concerns.  Objective:  Vital signs in last 24 hours: Vitals:   10/27/20 0305 10/27/20 0700 10/27/20 0733 10/27/20 1100  BP: 118/68  (!) 151/137   Pulse: 83 (!) 111 94   Resp: 18  (!) 22 20  Temp: 97.8 F (36.6 C)  97.9 F (36.6 C) 98 F (36.7 C)  TempSrc: Oral  Oral Oral  SpO2: 100% 97% 98%   Weight: 69.2 kg     Height:      SpO2: 98 % O2 Flow Rate (L/min): 3 L/min   Intake/Output Summary (Last 24 hours) at 10/27/2020 1124 Last data filed at 10/27/2020 0700 Gross per 24 hour  Intake 408.65 ml  Output 100 ml  Net 308.65 ml   Filed Weights   10/26/20 1342 10/26/20 2318 10/27/20 0305  Weight: 72.9 kg 69.3 kg 69.2 kg   Physical Exam Vitals and nursing note reviewed.  Constitutional:      General: She is in acute distress.     Appearance: She is ill-appearing.  HENT:     Head: Normocephalic and atraumatic.  Eyes:     Extraocular Movements: Extraocular movements intact.     Conjunctiva/sclera: Conjunctivae normal.  Cardiovascular:     Rate and Rhythm: Normal rate. Rhythm irregular.  Pulmonary:     Effort: No respiratory  distress.     Breath sounds: Normal breath sounds.  Abdominal:     General: Bowel sounds are normal. There is distension.     Tenderness: There is no abdominal tenderness.     Hernia: A hernia is present.  Musculoskeletal:        General: Normal range of motion.     Cervical back: Normal range of motion and neck supple.     Right lower leg: No edema.     Left lower leg: No edema.  Skin:    General: Skin is warm and dry.     Capillary Refill: Capillary refill takes less than 2 seconds.  Psychiatric:     Comments: Very anxious appearing     CBC Latest Ref Rng & Units 10/27/2020 10/27/2020 10/26/2020  WBC 4.0 - 10.5 K/uL 15.7(H) 16.3(H) 17.7(H)  Hemoglobin 12.0 - 15.0 g/dL 7.4(L) 8.1(L) 9.3(L)  Hematocrit 36 - 46 % 24.1(L) 26.9(L) 30.5(L)  Platelets 150 - 400 K/uL 228 235 293   CMP Latest Ref Rng & Units 10/27/2020 10/26/2020 10/26/2020  Glucose 70 - 99 mg/dL 142(H) 178(H) 163(H)  BUN 8 - 23 mg/dL 34(H) 41(H) 30(H)  Creatinine 0.44 - 1.00 mg/dL 1.93(H) 2.00(H) 1.96(H)  Sodium 135 - 145 mmol/L 143 143 141  Potassium 3.5 - 5.1 mmol/L 3.7 3.7 3.6  Chloride 98 - 111 mmol/L 110 109 108  CO2 22 - 32 mmol/L 21(L) - 17(L)  Calcium 8.9 - 10.3 mg/dL 9.6 - 10.2  Total Protein 6.5 - 8.1 g/dL 5.7(L) - 6.4(L)  Total Bilirubin 0.3 - 1.2 mg/dL 1.0 - 1.0  Alkaline Phos 38 - 126 U/L 85 - 103  AST 15 - 41 U/L 23 - 18  ALT 0 - 44 U/L 10 - 9   INR - 1.3 PT - 16.1 APTT - 27 Digoxin level - 0.5  IMAGING: No new imaging  Assessment/Plan:  Active Problems:   Retroperitoneal bleed  [name] is a 64 year old female with past medical history of chronic systolic and diastolic heart failure, chronic atrial fibrillation (on Eliquis,) CAD s/p CABG, aortic stenosis s/p bioprosthetic aortic valve, prior CVA of left cerebellum and pons, HTN, HLD, COPD, CKD IV, anxiety, and current tobacco use disorder who presented to [facility] on 10/26/2020 for evaluation of left flank pain found to have enlargement  of known left adrenal mass with hemorrhage into neoplasm and retroperitoneum.  #Hemorrhagic left adrenal mass, active Patient with known large left-sided adrenal mass noted on imaging in February of 2019 presenting with sudden onset of left-flank pain. Imaging on arrival reveals enlargement of the mass with hemorrhage into neoplasm itself as well as the retroperitoneum. IR and General Surgery both consulted initially by ED provider who reported that both services declined surgical intervention at this time given multiple risk factors for the patient. Patient's mass has not been biopsied since initial finding in 2019, however pheochromocytoma and adrenal cortical carcinoma remain high on the differential. Of note, patient does have a strong family history of cancer including ovarian and breast cancer in first degree relatives raising the suspicion for malignancy. She received KCentra in the ED and her home anticoagulation is being held.  11/2: Patient having gradually down trending hemoglobin, most recently to 7.4 likely secondary to bleed. Discussed case with IR who recommended to continue with medical management as patient has various risk factors (most notably her poor renal function) which limit the ability to undergo arteriogram and embolization. Discussed case with general surgery who recommend consulting urology as they had originally seen patient in 2019. -Oxycodone 5mg  tablet Q6H PRN for moderate to severe pain -Hydromorphone 1mg  IV Q4H PRN for severe pain -Alprazolam 0.25mg  Q8H -CBC Q8H, continue monitoring hemoglobin closely -Transfuse if Hb <7 -Continue holding home Eliquis -Dr. Doylene Canard with cardiology following closely -IR following, pending formal recommendations -Consult urology today  #Atrial fibrillation with RVR, chronic Patient's home medication regimen for this condition includes metoprolol 12.5mg  twice daily, diltiazem 120mg  daily, and Eliquis 2.5mg  twice daily. Patient initially  placed on diltiazem drip with improvement in heart rate to mid 90s. Dr. Doylene Canard following closely and providing recommendations. In the setting of her hemorrhagic left adrenal mass, we will continue to hold her home Eliquis. -Dr. Doylene Canard with cardiology is following:  -Recommended to discontinue diltiazem drip and transition to PO (diltiazem 60mg  Q8H)  -Recommended metoprolol 50mg  twice daily -Holding home Eliquis  #Large pelvic cyst, active #Cystic areas in bilateral kidneys, active Patient noted to have 10 x 7cm cystic lesion in the pelvis on CT scan concerning low-grade cystic neoplasm as well as multiple cystic areas in bilateral kidneys. Similar findings noted in initial CT Abdomen/Pelvis in February of 2019. Although further workup of these lesions is warranted, patient's current acute bleed is the primary focus at this time. -Radiology recommendations:  -GYN consult  -MRI pelvis with and without contrast if clinically warranted  -Consider  follow-up renal sonogram or multiphase CT when the patient is able  #Hypertension, chronic Patient's blood pressure improved from yesterday. Most recently 151/137, however patient in significant pain at time of evaluation. -Metoprolol 50mg  twice daily -Diltiazem 60mg  R8S  #Chronic systolic and diastolic heart failure, chronic Patient endorses shortness of breath not worsened from her baseline. On arrival to the ED, patient saturating well on room air, however she has had increased oxygen requirement, currently to 3L saturating in high 90s. On physical examination, she does not appear to be volume overloaded. Chest x-ray showing mild vascular congestion unchanged from baseline. We will restart her home medication regimen at this time. -Continue home torsemide 20mg  Monday, Wednesday and Friday -Continue digoxin 0.125mg  Monday, Wednesday and Friday  -Digoxin level -3L O2 via nasal cannula currently, wean as tolerated -Dr. Doylene Canard with cardiology is  following:  -Recommended to obtain echocardiogram for LV function (currently pending)  #HLD, chronic -Continue home gemfibrozil  #Anxiety, chronic -Continue home sertraline  Code status: Full code VTE ppx: SCDs Bowel regimen: Senna twice daily Diet: Heart healthy PT/OT recs: None at this time  Cato Mulligan, MD 10/27/2020, 11:24 AM Pager: [phone] After 5pm on weekdays and 1pm on weekends: On Call pager [phone]

## 2020-10-27 NOTE — Progress Notes (Signed)
Date: 10/27/2020  Patient name: Renee Griffin  Medical record number: [id number]  Date of birth: 1956-08-27   I have seen and evaluated Darrold Junker and discussed their care with the Residency Team.  In brief, patient is 64 year old female with a past medical history of left-sided adrenal mass, chronic combined systolic and diastolic heart failure, chronic A. fib on anticoagulation, CAD status post CABG, aortic stenosis status post bioprosthetic aortic valve, prior CVA, hypertension, hyperlipidemia, COPD, CKD stage IV, anxiety and current tobacco use who presented to the ED with left flank pain x1 day.  Patient states that yesterday morning she developed new onset left-sided back pain which was 8 out of 10 at its worst.  Back pain is constant and worsened by movement.  No nausea or vomiting, no lightheadedness, no syncope, no focal weakness, no tingling or numbness, no headache, no blurry vision, no chest pain, no shortness of breath, no palpitations, no diaphoresis, no diarrhea, no fevers or chills.  Of note, patient does have history of a left-sided adrenal mass that was diagnosed in 2019 as well as a septated left ovarian cystic mass which was diagnosed in 2019 as well.  Today, patient complains of persistent left-sided back pain only partially improved with oral oxycodone.  PMHx, Fam Hx, and/or Soc Hx : As per resident admit note  Vitals:   10/27/20 0733 10/27/20 1100  BP: (!) 151/137   Pulse: 94   Resp: (!) 22 20  Temp: 97.9 F (36.6 C) 98 F (36.7 C)  SpO2: 98%    General: Awake, alert, oriented x3, moderate distress secondary to pain CVS: Irregularly irregular Lungs: CTA bilaterally Abdomen: Soft, nondistended, normoactive bowel sounds, left flank and CVA tenderness noted on palpation Extremities: No edema noted, nontender to palpation Psych: Anxious HEENT: Normocephalic, atraumatic Skin: Warm and dry  Assessment and Plan: I have seen and evaluated the patient as outlined  above. I agree with the formulated Assessment and Plan as detailed in the residents' note, with the following changes:   1.  Acute blood loss anemia secondary to hemorrhage into left adrenal mass: -Patient presented to the ED with new onset left-sided flank pain in the setting of his known left-sided adrenal mass and imaging in the ED showed enlargement of the mass with hemorrhage into the mass as well as the retroperitoneum.  Patient's hemoglobin has continued to decrease and is down to 7.4 today from 11.8 on admission consistent with acute blood loss anemia.  Patient is currently hemodynamically stable but will need to monitor her vitals closely in stepdown given her ongoing bleed. -IR follow-up and recommendations appreciated.  If patient continues to bleed will likely need adrenal arteriogram with embolization -We will also discuss case with urology who have been following the adrenal mass as an outpatient to see if any surgical intervention is necessary at this time -Continue pain control for now -We will transfuse 1 unit PRBC today and monitor CBC closely (every 8 hours) -Continue to hold home Eliquis at this time -No further work-up for now  2.  A. fib with RVR: -Patient was noted to have A. fib with RVR on admission.  Suspect RVR is likely secondary to underlying RP bleed with associated pain and volume loss -Patient was on Cardizem drip for heart rate control.  Cardiology follow-up recommendations appreciated. -Would not start the patient on oral medications at this time as she may become hemodynamically unstable and oral medications will have a longer half-life than the Cardizem drip.  Would consider maintaining her on Cardizem drip for now for control of her heart rate. -Continue to hold anticoagulation at this time  3.  CKD stage IV: -Patient's creatinine is currently at baseline.  We will continue to monitor BMP daily.  Aldine Contes, MD 11/2/202112:46 PM

## 2020-10-27 NOTE — Progress Notes (Signed)
  Echocardiogram 2D Echocardiogram has been performed.  Renee Griffin Renee Griffin 10/27/2020, 12:06 PM

## 2020-10-27 NOTE — Progress Notes (Signed)
Patient is back on Cardizem drip per internal med Dr Doylene Canard is also aware

## 2020-10-27 NOTE — Consult Note (Signed)
Chief Complaint: Patient was seen in consultation today for consideration of adrenal arteriogram with possible embolization Chief Complaint  Patient presents with  . Flank Pain  . Back Pain   at the request of Dr Violet Baldy Dr Jeanne Ivan   Supervising Physician: Aletta Edouard  Patient Status: [facility] - In-pt  History of Present Illness: Renee Griffin is a 64 y.o. female   Known large left adrenal mass Follows with Dr Aly[facility]- also for renal cysts CT revealing mass as of 02/01/2018  Dr. Nicolette Bang and Dr. Kathie Rhodes for adrenal tumor. Her CA 125 test was normal, cortisol level was normal and free metanephrine level was normal. Normetanephrine level was elevated at 444 pg and was considered indeterminate.   Hx CHF; Ao stenosis; Afib (on Eliquis); CVA HTN; HLD; CKD Anxiety and smoker  To ED 10/26/20 with left flank pain; SOB Sudden onset 11/1 am-- no injury; no other precipitating cause Denies N/V Denies fever/chills  CT 10/26/20: IMPRESSION: 1. Interval enlargement of the LEFT adrenal mass with heterogeneity, calcification with enlargement since the previous imaging study. This lesion displaces the kidney inferiorly and bowel loops and pancreas anteriorly with much more mass effect upon the pancreas and LEFT upper quadrant structures than on the prior study. Hemorrhage into existing adrenal neoplasm and into adjacent retroperitoneum, mixed density with areas of high density could be seen in the setting of ongoing bleeding. Given size and appearance adrenal cortical carcinoma and pheochromocytoma are considered. Biochemical correlation and endocrine and surgical consultation may be helpful 2. Ascites in the abdomen and pelvis. Density values between 10 and 20 Hounsfield units, small amount of blood in the peritoneum is possible though most all of the hyperdense material appears to be either periadrenal or in the anterior pararenal space in the  LEFT hemiabdomen. 3. Cystic lesion in the pelvis measuring 10 x 7 cm when measured in a similar fashion approximately 8 x 6 cm. Indolent neoplasm could have this appearance. This is concerning for low-grade cystic neoplasm. Recommend GYN consult, and consider pelvis MRI w/o and w/ contrast if clinically warranted. Note: This recommendation does not apply to premenarchal patients and to those with increased risk (genetic, family history, elevated tumor markers or other high-risk factors) of ovarian cancer. Reference: JACR 2020 Feb; 17(2):248-254 4. Cystic areas in the bilateral kidneys, some areas indeterminate based on density values. Slight enlargement of the largest area on the RIGHT that previously measured water density now with 45 Hounsfield units. Some of these areas could represent small solid renal lesions though many appeared simple in terms of density on prior imaging. Consider follow-up renal sonogram or multiphase CT when the patient is able. 5. Small pleural effusions in the chest, associated with septal thickening at the lung bases could reflect mild pulmonary edema. 6. Cardiomegaly with signs of mitral annular and aortic valvular calcification. 7. Aortic atherosclerosis. Coronary artery disease and valvular disease.  Request has been made for IR to be aware of this patient- Prepare for possible adrenal arteriogram with embolization--- should this be necessary.    Past Medical History:  Diagnosis Date  . Depression   . Diabetes mellitus without complication ([place])   . Heart disease, congenital   . High cholesterol   . Hypertension   . Panic attack   . Stroke [facility])     Past Surgical History:  Procedure Laterality Date  . CARDIAC SURGERY  2011   CABG  . COLONOSCOPY WITH PROPOFOL N/A 02/08/2018   Procedure: COLONOSCOPY  WITH PROPOFOL;  Surgeon: Otis Brace, MD;  Location: [facility];  Service: Gastroenterology;  Laterality: N/A;  . COLONOSCOPY WITH  PROPOFOL N/A 12/16/2018   Procedure: COLONOSCOPY WITH PROPOFOL;  Surgeon: Ronald Lobo, MD;  Location: [facility];  Service: Endoscopy;  Laterality: N/A;  . DILATION AND EVACUATION     Patient states she had an abortion 30 or 40 years ago  . ESOPHAGOGASTRODUODENOSCOPY (EGD) WITH PROPOFOL Left 02/05/2018   Procedure: ESOPHAGOGASTRODUODENOSCOPY (EGD) WITH PROPOFOL;  Surgeon: Otis Brace, MD;  Location: [facility];  Service: Gastroenterology;  Laterality: Left;  . IR ANGIOGRAM SELECTIVE EACH ADDITIONAL VESSEL  12/18/2018  . IR ANGIOGRAM SELECTIVE EACH ADDITIONAL VESSEL  12/18/2018  . IR ANGIOGRAM SELECTIVE EACH ADDITIONAL VESSEL  12/18/2018  . IR ANGIOGRAM SELECTIVE EACH ADDITIONAL VESSEL  12/18/2018  . IR ANGIOGRAM VISCERAL SELECTIVE  12/18/2018  . IR EMBO ART  VEN HEMORR LYMPH EXTRAV  INC GUIDE ROADMAPPING  12/18/2018  . IR US GUIDE VASC ACCESS RIGHT  12/18/2018  . LEFT HEART CATH AND CORONARY ANGIOGRAPHY N/A 02/10/2020   Procedure: LEFT HEART CATH AND CORONARY ANGIOGRAPHY;  Surgeon: Dixie Dials, MD;  Location: [facility];  Service: Cardiovascular;  Laterality: N/A;  . WISDOM TOOTH EXTRACTION      Allergies: Amoxicillin-pot clavulanate, Bupropion, and Nicotine  Medications: Prior to Admission medications   Medication Sig Start Date End Date Taking? Authorizing Provider  acetaminophen (TYLENOL) 325 MG tablet Take 2 tablets (650 mg total) by mouth every 4 (four) hours as needed for headache or mild pain. 01/22/20  Yes Dixie Dials, MD  albuterol (VENTOLIN HFA) 108 (90 Base) MCG/ACT inhaler Inhale 2 puffs into the lungs every 6 (six) hours as needed for wheezing or shortness of breath. 01/22/20  Yes Dixie Dials, MD  allopurinol (ZYLOPRIM) 100 MG tablet Take 1 tablet (100 mg total) by mouth daily. 07/13/20  Yes Dorie Rank, MD  ALPRAZolam Duanne Moron) 0.5 MG tablet Take 1 tablet (0.5 mg total) by mouth 2 (two) times daily as needed for anxiety. Patient taking differently: Take  0.5 mg by mouth 3 (three) times daily.  02/24/20  Yes Virgel Manifold, MD  apixaban (ELIQUIS) 2.5 MG TABS tablet Take 1 tablet (2.5 mg total) by mouth 2 (two) times daily. 07/13/20  Yes Dorie Rank, MD  aspirin EC 81 MG tablet Take 1 tablet (81 mg total) by mouth daily. 01/22/20 01/21/21 Yes Dixie Dials, MD  cholecalciferol (VITAMIN D3) 25 MCG (1000 UNIT) tablet Take 1 tablet (1,000 Units total) by mouth daily. 07/13/20  Yes Dorie Rank, MD  digoxin Fonnie Birkenhead) 0.125 MG tablet Take 1 tablet (0.125 mg total) by mouth every Monday, Wednesday, and Friday. 05/29/20  Yes Dixie Dials, MD  diltiazem (CARDIZEM CD) 120 MG 24 hr capsule Take 1 capsule (120 mg total) by mouth daily. 05/28/20  Yes Dixie Dials, MD  ferrous sulfate 325 (65 FE) MG tablet Take 1 tablet (325 mg total) by mouth daily with breakfast. 07/13/20  Yes Dorie Rank, MD  gemfibrozil (LOPID) 600 MG tablet Take 600 mg by mouth 2 (two) times daily. 02/19/18  Yes [provider]  loperamide (IMODIUM) 2 MG capsule Take 1 capsule (2 mg total) by mouth 4 (four) times daily as needed for diarrhea or loose stools. Patient taking differently: Take 2 mg by mouth daily as needed for diarrhea or loose stools.  02/24/19  Yes Julianne Rice, MD  metoprolol tartrate (LOPRESSOR) 25 MG tablet Take 0.5 tablets (12.5 mg total) by mouth 2 (two) times daily. 12/25/18  Yes Dixie Dials, MD  Multiple Vitamin (MULTIVITAMIN WITH MINERALS) TABS tablet Take 1 tablet by mouth daily.   Yes [provider]  nitroGLYCERIN (NITROSTAT) 0.4 MG SL tablet Place 1 tablet (0.4 mg total) under the tongue every 5 (five) minutes x 3 doses as needed for chest pain. 02/14/20  Yes Dixie Dials, MD  oxymetazoline (AFRIN) 0.05 % nasal spray Place 1 spray into both nostrils 2 (two) times daily.   Yes [provider]  pantoprazole (PROTONIX) 40 MG tablet Take 1 tablet (40 mg total) by mouth 2 (two) times daily. Patient taking differently: Take 40 mg by mouth 2 (two) times  daily as needed (heartburn).  02/09/18  Yes Dixie Dials, MD  promethazine (PHENERGAN) 25 MG tablet Take 1 tablet (25 mg total) by mouth every 6 (six) hours as needed for nausea or vomiting. 07/13/20  Yes Dorie Rank, MD  sertraline (ZOLOFT) 100 MG tablet Take 100 mg by mouth at bedtime.    Yes [provider]  tiZANidine (ZANAFLEX) 2 MG tablet Take 1 tablet (2 mg total) by mouth at bedtime as needed for muscle spasms. 12/25/18  Yes Dixie Dials, MD  torsemide (DEMADEX) 20 MG tablet Take 1 tablet (20 mg total) by mouth every Monday, Wednesday, and Friday. 06/24/20  Yes Dixie Dials, MD  valACYclovir (VALTREX) 500 MG tablet Take 500 mg by mouth daily.   Yes [provider]  ACCU-CHEK AVIVA PLUS test strip See admin instructions. 01/29/16   [provider]  ACCU-CHEK SOFTCLIX LANCETS lancets See admin instructions. 01/30/16   [provider]  Blood Glucose Monitoring Suppl (ACCU-CHEK AVIVA PLUS) w/Device KIT See admin instructions. 01/29/16   [provider]  potassium chloride (KLOR-CON) 10 MEQ tablet Take 10 mEq by mouth daily. Patient not taking: Reported on 07/13/2020    [provider]     Family History  Problem Relation Age of Onset  . Cancer Mother        uterian OR cervical  . Cancer Father        lung  . Leukemia Brother   . Cancer Sister        breast    Social History   Socioeconomic History  . Marital status: Widowed    Spouse name: Not on file  . Number of children: Not on file  . Years of education: Not on file  . Highest education level: Not on file  Occupational History  . Not on file  Tobacco Use  . Smoking status: Current Every Day Smoker    Packs/day: 1.00    Years: 50.00    Pack years: 50.00    Types: Cigarettes  . Smokeless tobacco: Never Used  Vaping Use  . Vaping Use: Never used  Substance and Sexual Activity  . Alcohol use: No    Alcohol/week: 0.0 standard drinks  . Drug use: No  . Sexual activity: Not  Currently  Other Topics Concern  . Not on file  Social History Narrative  . Not on file   Social Determinants of Health   Financial Resource Strain:   . Difficulty of Paying Living Expenses: Not on file  Food Insecurity:   . Worried About Charity fundraiser in the Last Year: Not on file  . Ran Out of Food in the Last Year: Not on file  Transportation Needs:   . Lack of Transportation (Medical): Not on file  . Lack of Transportation (Non-Medical): Not on file  Physical Activity:   .  Days of Exercise per Week: Not on file  . Minutes of Exercise per Session: Not on file  Stress:   . Feeling of Stress : Not on file  Social Connections:   . Frequency of Communication with Friends and Family: Not on file  . Frequency of Social Gatherings with Friends and Family: Not on file  . Attends Religious Services: Not on file  . Active Member of Clubs or Organizations: Not on file  . Attends Archivist Meetings: Not on file  . Marital Status: Not on file     Review of Systems: A 12 point ROS discussed and pertinent positives are indicated in the HPI above.  All other systems are negative.   Vital Signs: BP (!) 151/137 (BP Location: Left Arm)   Pulse 94   Temp 98 F (36.7 C) (Oral)   Resp 20   Ht _0  (1.6 m)   Wt 152 lb 8.9 oz (69.2 kg)   SpO2 98%   BMI 27.02 kg/m   Physical Exam Vitals reviewed.  Cardiovascular:     Rate and Rhythm: Normal rate and regular rhythm.     Heart sounds: Normal heart sounds.  Pulmonary:     Breath sounds: Normal breath sounds.  Abdominal:     Palpations: Abdomen is soft.  Musculoskeletal:        General: Normal range of motion.  Skin:    General: Skin is warm.  Neurological:     Mental Status: She is alert and oriented to person, place, and time.  Psychiatric:        Behavior: Behavior normal.     Comments: Pt is moaning during our conversation Denies pain  "just nervous"      Imaging: CT ABDOMEN PELVIS WO  CONTRAST  Result Date: 10/26/2020 CLINICAL DATA:  Acute nonlocalized LEFT flank pain, LEFT-sided pain EXAM: CT ABDOMEN AND PELVIS WITHOUT CONTRAST TECHNIQUE: Multidetector CT imaging of the abdomen and pelvis was performed following the standard protocol without IV contrast. COMPARISON:  12/25/2018 FINDINGS: Lower chest: Mild septal thickening at the lung bases. No dense consolidation. No pleural effusion. Heart size is enlarged with signs of mitral annular and aortic valvular calcification. No pericardial effusion. Small pleural effusions in the chest. Trace on the LEFT and trace on the RIGHT slightly greater on the RIGHT than the LEFT. Hepatobiliary: Normal appearance of the liver on noncontrast imaging. No pericholecystic stranding. Pancreas: Pancreas splayed over a large LEFT adrenal mass, indistinct margins in the setting of periadrenal hemorrhage. Spleen: Stable low-density lesion in the spleen. Adrenals/Urinary Tract: Lobular low-attenuation lesion in the RIGHT adrenal gland is unchanged measuring approximately 2.4 x 2.4 cm. LEFT adrenal lesion with heterogeneity, calcification and enlargement since the previous imaging study 13 x 13 cm previously approximately 10 cm greatest axial dimension. This measures approximately 14 cm greatest craniocaudal dimension previously approximately 10 cm. Waves of variable density extend from the lesion which displaces the kidney inferiorly and bowel loops and pancreas anteriorly with much more mass effect upon the pancreas and LEFT upper quadrant structures than on the prior study. Cystic areas in the bilateral kidneys, some areas indeterminate based on density values, for instance LEFT kidney with a 1.7 x 1.6 cm area measuring 42 Hounsfield units (image 53, series 3) nephrolithiasis on the LEFT. This is stable compared to December of 2019. Slight enlargement of the largest area on the RIGHT that previously measured water density now with 45 Hounsfield unit density on  image 44 of series 3.  Stable appearance of intermediate density lesion in the interpolar RIGHT kidney on image 42 of series 3 No nephrolithiasis on the RIGHT. 6 mm calculus in the interpolar LEFT kidney and vascular calcifications in the LEFT kidney as well. Urinary bladder under distended limiting assessment. Stomach/Bowel: Mass effect upon bowel loops in the upper abdomen. No signs of bowel obstruction or acute bowel process. Vascular/Lymphatic: Calcified atheromatous plaque of the abdominal aorta. No aneurysmal dilation. There is no gastrohepatic or hepatoduodenal ligament lymphadenopathy. No retroperitoneal or mesenteric lymphadenopathy. No pelvic sidewall lymphadenopathy. Reproductive: Cystic lesion in the pelvis measuring 10 x 7 cm when measured in a similar fashion approximately 8 x 6 cm. Other: Small volume ascites. Density between 10 and 20 Hounsfield units throughout the abdomen. Musculoskeletal: No acute bone finding. No destructive bone process. IMPRESSION: 1. Interval enlargement of the LEFT adrenal mass with heterogeneity, calcification with enlargement since the previous imaging study. This lesion displaces the kidney inferiorly and bowel loops and pancreas anteriorly with much more mass effect upon the pancreas and LEFT upper quadrant structures than on the prior study. Hemorrhage into existing adrenal neoplasm and into adjacent retroperitoneum, mixed density with areas of high density could be seen in the setting of ongoing bleeding. Given size and appearance adrenal cortical carcinoma and pheochromocytoma are considered. Biochemical correlation and endocrine and surgical consultation may be helpful 2. Ascites in the abdomen and pelvis. Density values between 10 and 20 Hounsfield units, small amount of blood in the peritoneum is possible though most all of the hyperdense material appears to be either periadrenal or in the anterior pararenal space in the LEFT hemiabdomen. 3. Cystic lesion in the  pelvis measuring 10 x 7 cm when measured in a similar fashion approximately 8 x 6 cm. Indolent neoplasm could have this appearance. This is concerning for low-grade cystic neoplasm. Recommend GYN consult, and consider pelvis MRI w/o and w/ contrast if clinically warranted. Note: This recommendation does not apply to premenarchal patients and to those with increased risk (genetic, family history, elevated tumor markers or other high-risk factors) of ovarian cancer. Reference: JACR 2020 Feb; 17(2):248-254 4. Cystic areas in the bilateral kidneys, some areas indeterminate based on density values. Slight enlargement of the largest area on the RIGHT that previously measured water density now with 45 Hounsfield units. Some of these areas could represent small solid renal lesions though many appeared simple in terms of density on prior imaging. Consider follow-up renal sonogram or multiphase CT when the patient is able. 5. Small pleural effusions in the chest, associated with septal thickening at the lung bases could reflect mild pulmonary edema. 6. Cardiomegaly with signs of mitral annular and aortic valvular calcification. 7. Aortic atherosclerosis. Coronary artery disease and valvular disease. These results were called by telephone at the time of interpretation on 10/26/2020 at 1:29 pm to provider [facility] , who verbally acknowledged these results. Aortic Atherosclerosis (ICD10-I70.0). Electronically Signed   By: Zetta Bills M.D.   On: 10/26/2020 13:30   DG Chest Portable 1 View  Result Date: 10/26/2020 CLINICAL DATA:  Shortness of breath and back pain EXAM: PORTABLE CHEST 1 VIEW COMPARISON:  07/13/2020 FINDINGS: Cardiac shadow is enlarged but stable. Postsurgical changes are again seen. Lungs are well aerated bilaterally. Mild vascular congestion is noted but stable from the prior exam. No significant edema or focal infiltrate is noted. No bony abnormality is seen. IMPRESSION: Mild vascular congestion  stable from the prior exam. Electronically Signed   By: Linus Mako.D.  On: 10/26/2020 15:17    Labs:  CBC: Recent Labs    10/26/20 0846 10/26/20 0846 10/26/20 1518 10/26/20 1541 10/27/20 0023 10/27/20 0403  WBC 13.4*  --   --  17.7* 16.3* 15.7*  HGB 11.8*   < > 10.2* 9.3* 8.1* 7.4*  HCT 38.9   < > 30.0* 30.5* 26.9* 24.1*  PLT 346  --   --  293 235 228   < > = values in this interval not displayed.    COAGS: Recent Labs    02/08/20 0257 02/09/20 0251 02/10/20 0233 05/17/20 0100 05/17/20 1734 10/27/20 0023  INR 1.6*  --   --  1.7*  --  1.3*  APTT 51*   < > 62* 33 45* 27   < > = values in this interval not displayed.    BMP: Recent Labs    06/24/20 0641 06/24/20 0641 07/13/20 0604 07/13/20 0604 07/19/20 0920 07/19/20 0920 08/24/20 0919 10/26/20 0846 10/26/20 1518 10/27/20 0023  NA 140   < > 142   < > 139   < > 139 141 143 143  K 4.7   < > 4.3   < > 4.0   < > 3.8 3.6 3.7 3.7  CL 109   < > 106   < > 105   < > 107 108 109 110  CO2 20*   < > 21*   < > 20*  --  18* 17*  --  21*  GLUCOSE 150*   < > 130*   < > 106*   < > 122* 163* 178* 142*  BUN 40*   < > 31*   < > 40*   < > 29* 30* 41* 34*  CALCIUM 9.7   < > 9.9   < > 9.1  --  10.6* 10.2  --  9.6  CREATININE 2.12*   < > 1.88*   < > 2.07*   < > 1.78* 1.96* 2.00* 1.93*  GFRNONAA 24*   < > 28*   < > 25*  --  30* 28*  --  28*  GFRAA 28*  --  32*  --  29*  --  34*  --   --   --    < > = values in this interval not displayed.    LIVER FUNCTION TESTS: Recent Labs    05/27/20 0607 08/24/20 0919 10/26/20 0846 10/27/20 0023  BILITOT 0.6 0.5 1.0 1.0  AST 15 14* 18 23  ALT _0 ALKPHOS 88 127* 103 85  PROT 5.3* 6.7 6.4* 5.7*  ALBUMIN 2.9* 3.2* 3.4* 3.1*    TUMOR MARKERS: No results for input(s): AFPTM, CEA, CA199, CHROMGRNA in the last 8760 hours.  Assessment and Plan:  Left adrenal mass enlargement per CT -- consistent with bleed Eliquis has been discontinued Hg 9.3--8.1--7.4 -- no transfusion  noted Cr 1.96--2.0--1.93 Pt denies back pain at this point IR aware of pt; hg and Cr status Pt is aware of possible need for adrenal arteriogram with embolization if bleeding continues/worsens or MD feels appropriate. I have spoken to pt about procedure--- she seems aware of procedure and possible need if necessary.  She is "nervous" about prospect of same. She is aware we will continue to keep her on IR Radar. MD to involve IR if need  Thank you for this interesting consult.  I greatly enjoyed meeting Renee Griffin and look forward to participating in their care.  A copy of  this report was sent to the requesting provider on this date.  Electronically Signed: Lavonia Drafts, PA-C 10/27/2020, 11:23 AM   I spent a total of 40 Minutes    in face to face in clinical consultation, greater than 50% of which was counseling/coordinating care for consideration of adrenal arteriogram with possible embolization

## 2020-10-27 NOTE — Progress Notes (Signed)
Patient set the bed alarm off with agtation with poor self awareness.

## 2020-10-27 NOTE — Plan of Care (Signed)
  Problem: Safety: Goal: Ability to remain free from injury will improve Outcome: Progressing   

## 2020-10-28 LAB — CBC
HCT: 26.3 % — ABNORMAL LOW (ref 36.0–46.0)
HCT: 29.8 % — ABNORMAL LOW (ref 36.0–46.0)
Hemoglobin: 7.9 g/dL — ABNORMAL LOW (ref 12.0–15.0)
Hemoglobin: 9.5 g/dL — ABNORMAL LOW (ref 12.0–15.0)
MCH: 30 pg (ref 26.0–34.0)
MCH: 31.5 pg (ref 26.0–34.0)
MCHC: 30 g/dL (ref 30.0–36.0)
MCHC: 31.9 g/dL (ref 30.0–36.0)
MCV: 100 fL (ref 80.0–100.0)
MCV: 98.7 fL (ref 80.0–100.0)
Platelets: 189 10*3/uL (ref 150–400)
Platelets: 179 10*3/uL (ref 150–400)
RBC: 2.63 MIL/uL — ABNORMAL LOW (ref 3.87–5.11)
RBC: 3.02 MIL/uL — ABNORMAL LOW (ref 3.87–5.11)
RDW: 18.5 % — ABNORMAL HIGH (ref 11.5–15.5)
RDW: 18.1 % — ABNORMAL HIGH (ref 11.5–15.5)
WBC: 20.1 10*3/uL — ABNORMAL HIGH (ref 4.0–10.5)
WBC: 18 10*3/uL — ABNORMAL HIGH (ref 4.0–10.5)
nRBC: 0.1 % (ref 0.0–0.2)
nRBC: 0 % (ref 0.0–0.2)

## 2020-10-28 LAB — BASIC METABOLIC PANEL
Anion gap: 13 (ref 5–15)
BUN: 42 mg/dL — ABNORMAL HIGH (ref 8–23)
CO2: 20 mmol/L — ABNORMAL LOW (ref 22–32)
Calcium: 9 mg/dL (ref 8.9–10.3)
Chloride: 103 mmol/L (ref 98–111)
Creatinine, Ser: 2.78 mg/dL — ABNORMAL HIGH (ref 0.44–1.00)
GFR, Estimated: 18 mL/min — ABNORMAL LOW (ref >60)
Glucose, Bld: 110 mg/dL — ABNORMAL HIGH (ref 70–99)
Potassium: 4 mmol/L (ref 3.5–5.1)
Sodium: 136 mmol/L (ref 135–145)

## 2020-10-28 LAB — CBC WITH DIFFERENTIAL/PLATELET
Abs Immature Granulocytes: 0.12 10*3/uL — ABNORMAL HIGH (ref 0.00–0.07)
Basophils Absolute: 0 10*3/uL (ref 0.0–0.1)
Basophils Relative: 0 %
Eosinophils Absolute: 0 10*3/uL (ref 0.0–0.5)
Eosinophils Relative: 0 %
HCT: 28.5 % — ABNORMAL LOW (ref 36.0–46.0)
Hemoglobin: 8.9 g/dL — ABNORMAL LOW (ref 12.0–15.0)
Immature Granulocytes: 1 %
Lymphocytes Relative: 3 %
Lymphs Abs: 0.5 10*3/uL — ABNORMAL LOW (ref 0.7–4.0)
MCH: 30.4 pg (ref 26.0–34.0)
MCHC: 31.2 g/dL (ref 30.0–36.0)
MCV: 97.3 fL (ref 80.0–100.0)
Monocytes Absolute: 1.1 10*3/uL — ABNORMAL HIGH (ref 0.1–1.0)
Monocytes Relative: 6 %
Neutro Abs: 15.8 10*3/uL — ABNORMAL HIGH (ref 1.7–7.7)
Neutrophils Relative %: 90 %
Platelets: 177 10*3/uL (ref 150–400)
RBC: 2.93 MIL/uL — ABNORMAL LOW (ref 3.87–5.11)
RDW: 17.7 % — ABNORMAL HIGH (ref 11.5–15.5)
WBC: 17.6 10*3/uL — ABNORMAL HIGH (ref 4.0–10.5)
nRBC: 0.1 % (ref 0.0–0.2)

## 2020-10-28 LAB — PREPARE RBC (CROSSMATCH)

## 2020-10-28 MED ORDER — LACTATED RINGERS IV BOLUS
1000.0000 mL | Freq: Once | INTRAVENOUS | Status: AC
  Administered 2020-10-28: 1000 mL via INTRAVENOUS

## 2020-10-28 MED ORDER — SODIUM CHLORIDE 0.9% IV SOLUTION: Freq: Once | INTRAVENOUS | Status: DC

## 2020-10-28 NOTE — Progress Notes (Addendum)
Pt brother, Remo Lipps, called in pt room to speak with pt; Brother requested to speak to RN; pt OK to speak to him and he is listed on chart. Attempted to return brother phone call on number listed in chart but was unsuccessful x2, will attempt again later this afternoon. Will continue to monitor.   1722: Paged MD inquiring whether to give or hold Minipress this evening since BP have been soft and required LR boluses today. MD stated to give Minipress unless MAP<65.   1753: Giving LR bolus per order, several attempts made to give Minipress. Pt refused to take medication; Complaining of increase SOB or more than normal. Pt head elevated; stopped bolus; VS at baseline. Page on call resident, waiting for page back on how to precede.

## 2020-10-28 NOTE — Plan of Care (Signed)
°  Problem: Coping: °Goal: Level of anxiety will decrease °Outcome: Progressing °  °

## 2020-10-28 NOTE — Progress Notes (Signed)
Subjective/Chief Complaint:  1 - Large Left Adrenal Neoplasm With Recurrent Bleed / Suspect Pheochromocytoma or Adrenocortical Carcinoma - 15cm left adrenal neoplasm with mostly internal hemorrhage by ER CT 11/1. Similar episode 2019 managed conservatively. Endocrine eval 2019 with elevated metanephrines at 4X ULN and known refractory hypertension. Failed to follow up for management in 2019. Prior imaging with large aortic branch supply to mass.   Recent Course This Admission: 11/2 - 1u pRBC, started alpha blockade with prazosin  11/3 1u pRBC Hgb 7.9 --> 8.9  2 - Stage 4 Renal Insufficiency - Cr 2s with GFR 25-30. No hydro on imaging x several, Known severe HTN x years.  Today "[place]" remains with severe problem. Hgb responding well. BP down some as expected with alpha blockade likely due to long term intravascular depletion.    Objective: Vital signs in last 24 hours: Temp:  [97.7 F (36.5 C)-98.7 F (37.1 C)] 98.7 F (37.1 C) (11/03 1015) Pulse Rate:  [66-92] 66 (11/03 1015) Resp:  [11-22] 20 (11/03 1015) BP: (78-140)/(51-80) 108/74 (11/03 1015) SpO2:  [83 %-100 %] 100 % (11/03 1015) Weight:  [70.9 kg] 70.9 kg (11/03 0040) Last BM Date: 10/28/20  Intake/Output from previous day: 11/02 0701 - 11/03 0700 In: 1201 [P.O.:720; I.V.:121; Blood:360] Out: 200 [Urine:200] Intake/Output this shift: Total I/O In: 329.3 [Blood:329.3] Out: -   EXAM: AOx1, in and out of alertness (at baseline per report) HR 80s, SBP low 100s Non-labored breathing on [place] O2 Less abd pain / LUQ TTP Stable truncal obesity No c/c/e  Lab Results:  Recent Labs    10/28/20 0423 10/28/20 1140  WBC 20.1* 17.6*  HGB 7.9* 8.9*  HCT 26.3* 28.5*  PLT 189 177   BMET Recent Labs    10/27/20 0023 10/28/20 0423  NA 143 136  K 3.7 4.0  CL 110 103  CO2 21* 20*  GLUCOSE 142* 110*  BUN 34* 42*  CREATININE 1.93* 2.78*  CALCIUM 9.6 9.0   PT/INR Recent Labs    10/27/20 0023  LABPROT 16.1*   INR 1.3*   ABG No results for input(s): PHART, HCO3 in the last 72 hours.  Invalid input(s): PCO2, PO2  Studies/Results: CT ABDOMEN PELVIS WO CONTRAST  Result Date: 10/26/2020 CLINICAL DATA:  Acute nonlocalized LEFT flank pain, LEFT-sided pain EXAM: CT ABDOMEN AND PELVIS WITHOUT CONTRAST TECHNIQUE: Multidetector CT imaging of the abdomen and pelvis was performed following the standard protocol without IV contrast. COMPARISON:  12/25/2018 FINDINGS: Lower chest: Mild septal thickening at the lung bases. No dense consolidation. No pleural effusion. Heart size is enlarged with signs of mitral annular and aortic valvular calcification. No pericardial effusion. Small pleural effusions in the chest. Trace on the LEFT and trace on the RIGHT slightly greater on the RIGHT than the LEFT. Hepatobiliary: Normal appearance of the liver on noncontrast imaging. No pericholecystic stranding. Pancreas: Pancreas splayed over a large LEFT adrenal mass, indistinct margins in the setting of periadrenal hemorrhage. Spleen: Stable low-density lesion in the spleen. Adrenals/Urinary Tract: Lobular low-attenuation lesion in the RIGHT adrenal gland is unchanged measuring approximately 2.4 x 2.4 cm. LEFT adrenal lesion with heterogeneity, calcification and enlargement since the previous imaging study 13 x 13 cm previously approximately 10 cm greatest axial dimension. This measures approximately 14 cm greatest craniocaudal dimension previously approximately 10 cm. Waves of variable density extend from the lesion which displaces the kidney inferiorly and bowel loops and pancreas anteriorly with much more mass effect upon the pancreas and LEFT upper quadrant structures  than on the prior study. Cystic areas in the bilateral kidneys, some areas indeterminate based on density values, for instance LEFT kidney with a 1.7 x 1.6 cm area measuring 42 Hounsfield units (image 53, series 3) nephrolithiasis on the LEFT. This is stable compared to  December of 2019. Slight enlargement of the largest area on the RIGHT that previously measured water density now with 45 Hounsfield unit density on image 44 of series 3. Stable appearance of intermediate density lesion in the interpolar RIGHT kidney on image 42 of series 3 No nephrolithiasis on the RIGHT. 6 mm calculus in the interpolar LEFT kidney and vascular calcifications in the LEFT kidney as well. Urinary bladder under distended limiting assessment. Stomach/Bowel: Mass effect upon bowel loops in the upper abdomen. No signs of bowel obstruction or acute bowel process. Vascular/Lymphatic: Calcified atheromatous plaque of the abdominal aorta. No aneurysmal dilation. There is no gastrohepatic or hepatoduodenal ligament lymphadenopathy. No retroperitoneal or mesenteric lymphadenopathy. No pelvic sidewall lymphadenopathy. Reproductive: Cystic lesion in the pelvis measuring 10 x 7 cm when measured in a similar fashion approximately 8 x 6 cm. Other: Small volume ascites. Density between 10 and 20 Hounsfield units throughout the abdomen. Musculoskeletal: No acute bone finding. No destructive bone process. IMPRESSION: 1. Interval enlargement of the LEFT adrenal mass with heterogeneity, calcification with enlargement since the previous imaging study. This lesion displaces the kidney inferiorly and bowel loops and pancreas anteriorly with much more mass effect upon the pancreas and LEFT upper quadrant structures than on the prior study. Hemorrhage into existing adrenal neoplasm and into adjacent retroperitoneum, mixed density with areas of high density could be seen in the setting of ongoing bleeding. Given size and appearance adrenal cortical carcinoma and pheochromocytoma are considered. Biochemical correlation and endocrine and surgical consultation may be helpful 2. Ascites in the abdomen and pelvis. Density values between 10 and 20 Hounsfield units, small amount of blood in the peritoneum is possible though most all  of the hyperdense material appears to be either periadrenal or in the anterior pararenal space in the LEFT hemiabdomen. 3. Cystic lesion in the pelvis measuring 10 x 7 cm when measured in a similar fashion approximately 8 x 6 cm. Indolent neoplasm could have this appearance. This is concerning for low-grade cystic neoplasm. Recommend GYN consult, and consider pelvis MRI w/o and w/ contrast if clinically warranted. Note: This recommendation does not apply to premenarchal patients and to those with increased risk (genetic, family history, elevated tumor markers or other high-risk factors) of ovarian cancer. Reference: JACR 2020 Feb; 17(2):248-254 4. Cystic areas in the bilateral kidneys, some areas indeterminate based on density values. Slight enlargement of the largest area on the RIGHT that previously measured water density now with 45 Hounsfield units. Some of these areas could represent small solid renal lesions though many appeared simple in terms of density on prior imaging. Consider follow-up renal sonogram or multiphase CT when the patient is able. 5. Small pleural effusions in the chest, associated with septal thickening at the lung bases could reflect mild pulmonary edema. 6. Cardiomegaly with signs of mitral annular and aortic valvular calcification. 7. Aortic atherosclerosis. Coronary artery disease and valvular disease. These results were called by telephone at the time of interpretation on 10/26/2020 at 1:29 pm to provider [facility] , who verbally acknowledged these results. Aortic Atherosclerosis (ICD10-I70.0). Electronically Signed   By: Zetta Bills M.D.   On: 10/26/2020 13:30   DG Chest Portable 1 View  Result Date: 10/26/2020 CLINICAL DATA:  Shortness of breath and back pain EXAM: PORTABLE CHEST 1 VIEW COMPARISON:  07/13/2020 FINDINGS: Cardiac shadow is enlarged but stable. Postsurgical changes are again seen. Lungs are well aerated bilaterally. Mild vascular congestion is noted but stable  from the prior exam. No significant edema or focal infiltrate is noted. No bony abnormality is seen. IMPRESSION: Mild vascular congestion stable from the prior exam. Electronically Signed   By: Inez Catalina M.D.   On: 10/26/2020 15:17   ECHOCARDIOGRAM COMPLETE  Result Date: 10/27/2020    ECHOCARDIOGRAM REPORT   Patient Name:   NEERA TENG Date of Exam: 10/27/2020 Medical Rec #:  [id number]      Height:       63.0 in Accession #:    [id number]     Weight:       152.6 lb Date of Birth:  December 31, 1955      BSA:          1.723 m Patient Age:    63 years       BP:           118/68 mmHg Patient Gender: F              HR:           110 bpm. Exam Location:  Inpatient Procedure: 2D Echo, Cardiac Doppler and Color Doppler Indications:     I48.0 Paroxysmal atrial fibrillation  History:         Patient has prior history of Echocardiogram examinations, most                  recent 02/08/2020. Stroke; Risk Factors:Hypertension, Diabetes                  and Dyslipidemia.                  Aortic Valve: bioprosthetic valve is present in the aortic                  position.  Sonographer:     Jonelle Sidle Dance Referring Phys:  [place] Diagnosing Phys: Dixie Dials MD IMPRESSIONS  1. Left ventricular ejection fraction, by estimation, is 50 to 55%. The left ventricle has low normal function. The left ventricle demonstrates regional wall motion abnormalities (see scoring diagram/findings for description). There is mild concentric left ventricular hypertrophy. Left ventricular diastolic parameters are indeterminate. There is moderate hypokinesis of the left ventricular, basal inferoseptal wall and inferior wall.  2. Right ventricular systolic function is normal. The right ventricular size is normal. There is normal pulmonary artery systolic pressure.  3. Left atrial size was severely dilated.  4. Right atrial size was severely dilated.  5. The mitral valve is degenerative. Moderate to severe mitral valve regurgitation. Moderate  to severe mitral annular calcification.  6. Tricuspid valve regurgitation is moderate to severe.  7. The aortic valve is tricuspid. There is mild calcification of the aortic valve. There is mild thickening of the aortic valve. Aortic valve regurgitation is trivial. Mild aortic valve stenosis. There is a bioprosthetic valve present in the aortic position.  8. There is mild (Grade II) atheroma plaque.  9. The inferior vena cava is normal in size with greater than 50% respiratory variability, suggesting right atrial pressure of 3 mmHg. FINDINGS  Left Ventricle: Left ventricular ejection fraction, by estimation, is 50 to 55%. The left ventricle has low normal function. The left ventricle demonstrates regional wall motion abnormalities. Moderate hypokinesis of the left ventricular, basal  inferoseptal wall and inferior wall. The left ventricular internal cavity size was normal in size. There is mild concentric left ventricular hypertrophy. Left ventricular diastolic parameters are indeterminate.  LV Wall Scoring: The basal inferolateral segment, basal inferior segment, and basal inferoseptal segment are hypokinetic. The entire anterior wall, antero-lateral wall, mid and distal lateral wall, entire anterior septum, mid and distal inferior wall, and mid inferoseptal segment are normal. Right Ventricle: The right ventricular size is normal. No increase in right ventricular wall thickness. Right ventricular systolic function is normal. There is normal pulmonary artery systolic pressure. The tricuspid regurgitant velocity is 2.86 m/s, and  with an assumed right atrial pressure of 3 mmHg, the estimated right ventricular systolic pressure is 70.1 mmHg. Left Atrium: Left atrial size was severely dilated. Right Atrium: Right atrial size was severely dilated. Pericardium: There is no evidence of pericardial effusion. Mitral Valve: The mitral valve is degenerative in appearance. There is moderate thickening of the mitral valve  leaflet(s). There is moderate calcification of the mitral valve leaflet(s). Mildly decreased mobility of the mitral valve leaflets. Moderate to  severe mitral annular calcification. Moderate to severe mitral valve regurgitation. Tricuspid Valve: The tricuspid valve is normal in structure. Tricuspid valve regurgitation is moderate to severe. Aortic Valve: The aortic valve is tricuspid. There is mild calcification of the aortic valve. There is mild thickening of the aortic valve. There is mild aortic valve annular calcification. Aortic valve regurgitation is trivial. Mild aortic stenosis is present. Aortic valve mean gradient measures 11.3 mmHg. Aortic valve peak gradient measures 22.9 mmHg. Aortic valve area, by VTI measures 1.36 cm. There is a bioprosthetic valve present in the aortic position. Pulmonic Valve: The pulmonic valve was normal in structure. Pulmonic valve regurgitation is not visualized. Aorta: The aortic root is normal in size and structure. There is mild (Grade II) atheroma plaque. Venous: The inferior vena cava is normal in size with greater than 50% respiratory variability, suggesting right atrial pressure of 3 mmHg. IAS/Shunts: The interatrial septum was not assessed.  LEFT VENTRICLE PLAX 2D LVIDd:         4.40 cm LVIDs:         3.20 cm LV PW:         1.20 cm LV IVS:        1.30 cm LVOT diam:     2.10 cm LV SV:         53 LV SV Index:   31 LVOT Area:     3.46 cm  RIGHT VENTRICLE          IVC RV Basal diam:  3.10 cm  IVC diam: 1.90 cm RV Mid diam:    2.00 cm TAPSE (M-mode): 1.5 cm LEFT ATRIUM              Index       RIGHT ATRIUM           Index LA diam:        4.70 cm  2.73 cm/m  RA Area:     24.80 cm LA Vol (A2C):   95.2 ml  55.24 ml/m RA Volume:   75.90 ml  44.04 ml/m LA Vol (A4C):   106.0 ml 61.50 ml/m LA Biplane Vol: 104.0 ml 60.34 ml/m  AORTIC VALVE AV Area (Vmax):    1.40 cm AV Area (Vmean):   1.33 cm AV Area (VTI):     1.36 cm AV Vmax:           239.33 cm/s  AV Vmean:           158.333 cm/s AV VTI:            0.387 m AV Peak Grad:      22.9 mmHg AV Mean Grad:      11.3 mmHg LVOT Vmax:         96.40 cm/s LVOT Vmean:        60.800 cm/s LVOT VTI:          0.152 m LVOT/AV VTI ratio: 0.39  AORTA Ao Root diam: 3.90 cm Ao Asc diam:  2.70 cm MITRAL VALVE                TRICUSPID VALVE MV Area (PHT): 3.42 cm     TR Peak grad:   32.7 mmHg MV Decel Time: 222 msec     TR Vmax:        286.00 cm/s MV E velocity: 159.50 cm/s                             SHUNTS                             Systemic VTI:  0.15 m                             Systemic Diam: 2.10 cm Dixie Dials MD Electronically signed by Dixie Dials MD Signature Date/Time: 10/27/2020/2:28:27 PM    Final     Anti-infectives: Anti-infectives (From admission, onward)   None      Assessment/Plan:  1 - Large Left Adrenal Neoplasm With Recurrent Bleed / Suspect Pheochromocytoma or Adrenocortical Carcinoma - difficult and dangerous problem in acute and chronic setting. Ideally, rec BP control especially with alpha blockers as most effective given endocrine active adrenal neoplasm, PRN transfusion, and then elective left adrenalectomy in 6-12 weeks. Should her bleeding become transfusion refracotry, then either IR embolization of her likely dominant aortic branch adrenal vessel (risk of contrast nephropathy) or surgery this admission as next best options.   She will likely go through phase of needing volume/RBC expansion as alpha blockade sets in. This will make future surgery MUCH safer as she will have much more hemodynamic reserve after adrenal removed and she loses the abnormal alpha stimulation.  Ideally continue alpha blockade, and even possibly escalate pending her response.  GREATLY appreciate medical / cardiology team comanagement.   2 - Stage 4 Renal Insufficiency - Medical renal disease likely form HTN (made worse from above), likely chronic intravascular volume depletion, and DM2.  STRONGLY encouraged patient to  follow up for these matters as they are life threatening chronically as well.   Will follow, Please call me directly anytime.   Alexis Frock 10/28/2020

## 2020-10-28 NOTE — Progress Notes (Signed)
PM ROUNDING NOTE:  Pt re-evaluated this afternoon. She was sitting up in bed, eating spaghetti and drinking a soda. She appears more comfortable than she did this morning. She notes persistent pain in the left flank.  Vitals: Blood pressure 92/54, MAP 65, pulse 66, temperature 98.7 F (37.1 C), temperature source Oral, resp. rate 20,  SpO2 100 %. No significant change on visual exam.  Post transfusion hgb 8.9. as noted in Dr. Durenda Age note, renal function is worsened this morning.  Assessment: will await urology recommendations however is hemodynamically stable at this time. She continues to be high risk for decompensation. Will continue to work on pain control and balance this with maintaing her MAPs. No need for repeat blood transfusion at this time given her post transfusion h/h however will continue to bolus fluids given her pressures and AKI. Plan --will give another liter bolus of LR --1mg  dilaudid --continue q8h cbc --await urology recommendations.  Mitzi Hansen, MD Internal Medicine Resident PGY-2 [facility] Pager: [phone] 10/28/2020 1:07 PM

## 2020-10-28 NOTE — Consult Note (Signed)
Ref: Dixie Dials, MD   Subjective:  Awake. Afebrile. Hgb remains low. Another unit of PRBC is given now. BP is also low post alpha blockade.   Objective:  Vital Signs in the last 24 hours: Temp:  [97.7 F (36.5 C)-98.5 F (36.9 C)] 98.5 F (36.9 C) (11/03 0800) Pulse Rate:  [66-92] 74 (11/03 0901) Cardiac Rhythm: Atrial fibrillation (11/03 0700) Resp:  [11-22] 22 (11/03 0800) BP: (78-140)/(51-80) 90/53 (11/03 0841) SpO2:  [83 %-100 %] 98 % (11/03 0800) Weight:  [70.9 kg] 70.9 kg (11/03 0040)  Physical Exam: BP Readings from Last 1 Encounters:  10/28/20 (!) 90/53     Wt Readings from Last 1 Encounters:  10/28/20 70.9 kg    Weight change: -2 kg Body mass index is 27.69 kg/m. HEENT: [place]/AT, Eyes-Blue, PERL, EOMI, Conjunctiva-Pale, Sclera-Non-icteric Neck: No JVD, No bruit, Trachea midline. Lungs:  Clearing, Bilateral. Cardiac:  Irregular rhythm, normal S1 and S2, no S3. II/VI systolic murmur. Abdomen:  Soft, non-tender. BS present. Extremities:  No edema present. No cyanosis. No clubbing. CNS: AxOx3, Cranial nerves grossly intact, moves all 4 extremities.  Skin: Warm and dry.   Intake/Output from previous day: 11/02 0701 - 11/03 0700 In: 1201 [P.O.:720; I.V.:121; Blood:360] Out: 200 [Urine:200]    Lab Results: BMET    Component Value Date/Time   NA 136 10/28/2020 0423   NA 143 10/27/2020 0023   NA 143 10/26/2020 1518   K 4.0 10/28/2020 0423   K 3.7 10/27/2020 0023   K 3.7 10/26/2020 1518   CL 103 10/28/2020 0423   CL 110 10/27/2020 0023   CL 109 10/26/2020 1518   CO2 20 (L) 10/28/2020 0423   CO2 21 (L) 10/27/2020 0023   CO2 17 (L) 10/26/2020 0846   GLUCOSE 110 (H) 10/28/2020 0423   GLUCOSE 142 (H) 10/27/2020 0023   GLUCOSE 178 (H) 10/26/2020 1518   BUN 42 (H) 10/28/2020 0423   BUN 34 (H) 10/27/2020 0023   BUN 41 (H) 10/26/2020 1518   CREATININE 2.78 (H) 10/28/2020 0423   CREATININE 1.93 (H) 10/27/2020 0023   CREATININE 2.00 (H) 10/26/2020 1518    CALCIUM 9.0 10/28/2020 0423   CALCIUM 9.6 10/27/2020 0023   CALCIUM 10.2 10/26/2020 0846   GFRNONAA 18 (L) 10/28/2020 0423   GFRNONAA 28 (L) 10/27/2020 0023   GFRNONAA 28 (L) 10/26/2020 0846   GFRAA 34 (L) 08/24/2020 0919   GFRAA 29 (L) 07/19/2020 0920   GFRAA 32 (L) 07/13/2020 0604   CBC    Component Value Date/Time   WBC 20.1 (H) 10/28/2020 0423   RBC 2.63 (L) 10/28/2020 0423   HGB 7.9 (L) 10/28/2020 0423   HCT 26.3 (L) 10/28/2020 0423   PLT 189 10/28/2020 0423   MCV 100.0 10/28/2020 0423   MCH 30.0 10/28/2020 0423   MCHC 30.0 10/28/2020 0423   RDW 18.5 (H) 10/28/2020 0423   LYMPHSABS 1.0 05/27/2020 0607   MONOABS 0.6 05/27/2020 0607   EOSABS 0.2 05/27/2020 0607   BASOSABS 0.1 05/27/2020 0607   HEPATIC Function Panel Recent Labs    08/24/20 0919 10/26/20 0846 10/27/20 0023  PROT 6.7 6.4* 5.7*   HEMOGLOBIN A1C No components found for: HGA1C,  MPG CARDIAC ENZYMES Lab Results  Component Value Date   CKTOTAL 735 (H) 02/01/2018   BNP No results for input(s): PROBNP in the last 8760 hours. TSH Recent Labs    01/19/20 1250 05/17/20 1734  TSH 2.243 1.710   CHOLESTEROL Recent Labs    02/08/20 0257  CHOL 202*    Scheduled Meds: . sodium chloride   Intravenous Once  . ALPRAZolam  0.25 mg Oral Q8H  . digoxin  0.125 mg Oral Q M,W,F  . gemfibrozil  600 mg Oral BID  .  HYDROmorphone (DILAUDID) injection  2 mg Intravenous Once  . prazosin  2 mg Oral TID  . senna  1 tablet Oral BID  . sertraline  100 mg Oral QHS  . torsemide  20 mg Oral Q M,W,F   Continuous Infusions: PRN Meds:.HYDROmorphone (DILAUDID) injection, oxyCODONE  Assessment/Plan: Chronic atrial fibrillation Abdominal pain Left adrenal mass Pelvic mass Retroperitoneal bleed Anemia of blood loss, symptomatic Anemia of chronic disease CKD, IV Tobacco use disorder Hypertension Anxiety  Hold BP medications till BP improves.  PRBC for symptomatic blood loss anemia Appreciate urology  consult. Metanephrine levels are pending. Awaiting renal consult. Awaiting GYN consult.   LOS: 2 days   Time spent including chart review, lab review, examination, discussion with patient/Nurse/Resident and referring doctor : 30 min   Dixie Dials  MD  10/28/2020, 9:04 AM

## 2020-10-28 NOTE — Hospital Course (Addendum)
Hemorrhagic shock secondary to Retroperitoneal bleed (resolved)  Acute blood loss anemia (stable). S/p 2U PRBC.   Left adrenal mass suspicious for pheo -Urology recs: continue alpha blockade, elective left adrenalectomy in 6-12w. If becomes unable, IR embolization -metanephrine 67 (normal) -normetanephrine 347 (elevated)  AKI/CKD stage III-IV. Likely 2/2 ATN in setting of hemmorhagic shock -very poor UOP--0 for 11/3 and 11/4 -renal function continuing to worsen. Creatinine 2.78>3.9>4.8 -nephrology continuing to monitor.  Physical deconditioning. PT ordered. Will likely need SNF  Poor health literacy.   Leukocyosis stable-afebrile

## 2020-10-28 NOTE — Progress Notes (Signed)
Subjective:  Ms. Renee Renee Griffin. Renee Griffin is a 64 year old female with past medical history of chronic systolic and diastolic heart failure, chronic atrial fibrillation (on Eliquis,) CAD s/p CABG, aortic stenosis s/p bioprosthetic aortic valve, prior CVA of left cerebellum and pons, HTN, HLD, COPD, CKD IV, anxiety, and current tobacco use disorder who presented to [facility] on 10/26/2020 for evaluation of left flank pain found to have enlargement of known left adrenal mass with hemorrhage into neoplasm and retroperitoneum.  Overnight, patient received oxycodone 5mg  x2 and dilaudid 1mg  x1 for pain.  This morning, patient reports that her pain is still ongoing. She is concerned about her overall condition. She understands her diagnosis and the need to control her blood pressure before providing additional pain medications. She understands the need for IV fluids and blood transfusions. She has no further questions or concerns.  Objective:  Vital signs in last 24 hours: Vitals:   10/28/20 0841 10/28/20 0901 10/28/20 0930 10/28/20 1015  BP: (!) 90/53  90/60 108/74  Pulse:  74  66  Resp:    20  Temp:    98.7 F (37.1 C)  TempSrc:    Oral  SpO2:    100%  Weight:      Height:      SpO2: 100 % O2 Flow Rate (L/min): 3 L/min   Intake/Output Summary (Last 24 hours) at 10/28/2020 1129 Last data filed at 10/28/2020 1015 Gross per 24 hour  Intake 1530.21 ml  Output 200 ml  Net 1330.21 ml   Filed Weights   10/26/20 2318 10/27/20 0305 10/28/20 0040  Weight: 69.3 kg 69.2 kg 70.9 kg   Physical Exam Vitals and nursing note reviewed.  Constitutional:      General: She is in acute distress.     Appearance: She is ill-appearing.  HENT:     Head: Normocephalic and atraumatic.  Eyes:     Extraocular Movements: Extraocular movements intact.     Conjunctiva/sclera: Conjunctivae normal.  Cardiovascular:     Rate and Rhythm: Normal rate. Rhythm irregular.  Pulmonary:     Effort: No respiratory distress.      Breath sounds: Normal breath sounds.  Abdominal:     General: Bowel sounds are normal. There is distension.     Tenderness: There is no abdominal tenderness.     Hernia: A hernia is present.  Musculoskeletal:        General: Normal range of motion.     Cervical back: Normal range of motion and neck supple.     Right lower leg: No edema.     Left lower leg: No edema.  Skin:    General: Skin is warm and dry.     Capillary Refill: Capillary refill takes less than 2 seconds.  Neurological:     General: No focal deficit present.     Mental Status: Mental status is at baseline.    CBC Latest Ref Rng & Units 10/28/2020 10/27/2020 10/27/2020  WBC 4.0 - 10.5 K/uL 20.1(H) 24.5(H) 15.7(H)  Hemoglobin 12.0 - 15.0 g/dL 7.9(L) 9.0(L) 7.4(L)  Hematocrit 36 - 46 % 26.3(L) 28.8(L) 24.1(L)  Platelets 150 - 400 K/uL 189 229 228   CMP Latest Ref Rng & Units 10/28/2020 10/27/2020 10/26/2020  Glucose 70 - 99 mg/dL 110(H) 142(H) 178(H)  BUN 8 - 23 mg/dL 42(H) 34(H) 41(H)  Creatinine 0.44 - 1.00 mg/dL 2.78(H) 1.93(H) 2.00(H)  Sodium 135 - 145 mmol/L 136 143 143  Potassium 3.5 - 5.1 mmol/L 4.0 3.7 3.7  Chloride 98 - 111 mmol/L 103 110 109  CO2 22 - 32 mmol/L 20(L) 21(L) -  Calcium 8.9 - 10.3 mg/dL 9.0 9.6 -  Total Protein 6.5 - 8.1 g/dL - 5.7(L) -  Total Bilirubin 0.3 - 1.2 mg/dL - 1.0 -  Alkaline Phos 38 - 126 U/L - 85 -  AST 15 - 41 U/L - 23 -  ALT 0 - 44 U/L - 10 -   Metanephrines - in process  IMAGING: No new imaging  Assessment/Plan:  Principal Problem:   Retroperitoneal bleed Active Problems:   HTN (hypertension)   Dyslipidemia   Anxiety   Ovarian mass, left   Atrial fibrillation with RVR (HCC)   Adrenal mass greater than 4 cm in diameter (HCC)   Acute blood loss anemia   CKD (chronic kidney disease) stage 4, GFR 15-29 ml/min (HCC)  Ms. Renee Renee Griffin is a 64 year old female with past medical history of chronic systolic and diastolic heart failure, chronic atrial fibrillation (on  Eliquis,) CAD s/p CABG, aortic stenosis s/p bioprosthetic aortic valve, prior CVA of left cerebellum and pons, HTN, HLD, COPD, CKD IV, anxiety, and current tobacco use disorder who presented to [facility] on 10/26/2020 for evaluation of left flank pain found to have enlargement of known left adrenal mass with hemorrhage into neoplasm and retroperitoneum.  #Hemorrhagic left adrenal mass, active Patient with known large left-sided adrenal mass noted on imaging in February of 2019 presenting with sudden onset of left-flank pain. Imaging on arrival reveals enlargement of the mass with hemorrhage into neoplasm itself as well as the retroperitoneum. Patient's mass has not been biopsied since initial finding in 2019, however pheochromocytoma and adrenal cortical carcinoma remain high on the differential. She received KCentra in the ED and her home anticoagulation is being held since admission.  11/2: Patient having gradually down trending hemoglobin, most recently to 7.4 likely secondary to bleed. Discussed case with IR who recommended to continue with medical management as patient has various risk factors (most notably her poor renal function) which limit the ability to undergo arteriogram and embolization. Discussed case with general surgery who recommend consulting urology as they had originally seen patient in 2019. 11/3: Patient continues to have gradually down trending hemoglobin following 1u PRBCs yesterday. Additionally, patient's blood pressure gradually down trending to 78E systolic. Discussed case with Dr. Tresa Moore with Urology. Patient's hypotension likely multifactorial including initiation of alpha-1 antagonist, ongoing bleed, continuation of diltiazem drip and metoprolol. Despite this, prazosin should be continued given possibility of emergent adrenalectomy. -Oxycodone 5mg  tablet Q6H PRN for moderate to severe pain (holding in setting of hypotension)  -Hydromorphone 1mg  IV Q4H PRN for severe pain (holding in  setting of hypotension) -Alprazolam 0.25mg  Q8H -CBC Q8H, continue monitoring hemoglobin closely -Transfuse Hb <8 (transfusing another 1u now) -Continue holding home Eliquis -Dr. Doylene Canard with cardiology following closely -IR following -Dr. Tresa Moore with Urology following  -Recommending left adrenalectomy in 6-12 weeks versus urgently if further decompensates  -If bleeding to become transfusion refractory, then either IR embolization or urology will take to OR  -Repeat metanephrines (pending)  -Prazosin PO Q8H  #Atrial fibrillation with RVR, chronic Patient's home medication regimen for this condition includes metoprolol 12.5mg  twice daily, diltiazem 120mg  daily, and Eliquis 2.5mg  twice daily. In the setting of her hemorrhagic left adrenal mass, we will continue to hold her home Eliquis, metoprolol and diltiazem. -Dr. Doylene Canard with cardiology is following: -Holding home metoprolol -Holding home diltiazem -Holding home Eliquis  #Acute on chronic kidney disease stage  4 Patient with baseline creatinine of approximately 1.9 with worsening renal function to 2.78 on today's labs. Likely secondary to alpha and beta blockade causing hypoperfusion to kidneys. She requires intravascular volume repletion and continuation of alpha blockade. -1L bolus of LR now -Transfusing blood now -Daily BMP  #Large pelvic cyst, active #Cystic areas in bilateral kidneys, active Patient noted to have 10 x 7cm cystic lesion in the pelvis on CT scan concerning low-grade cystic neoplasm as well as multiple cystic areas in bilateral kidneys. Similar findings noted in initial CT Abdomen/Pelvis in February of 2019. Although further workup of these lesions is warranted, patient's current acute bleed is the primary focus at this time. -Further workup pending stabilization of acute bleed  -GYN consult  -MRI pelvis with and without contrast if clinically warranted  -Consider follow-up renal sonogram or multiphase CT when the  patient is able  #Hypertension, chronic Patient hypotensive overnight and into this morning following initiation of prazosin in the setting of continuation of diltiazem drip and metoprolol. -Prazosin 2mg  three times daily -Holding home metoprolol in setting of hypotension -Holding home diltiazem in setting of hypotension  #HFpEF, chronic Patient endorses shortness of breath not worsened from her baseline. On arrival to the ED, patient saturating well on room air, however she has had increased oxygen requirement, currently to 3L saturating in high 90s. On physical examination, she does not appear to be volume overloaded. Chest x-ray showing mild vascular congestion unchanged from baseline. Echo reveals EF of 50-55% -Continue home torsemide 20mg  Monday, Wednesday and Friday -Continue digoxin 0.125mg  Monday, Wednesday and Friday -3L O2 via nasal cannula currently, wean as tolerated -Dr. Doylene Canard with cardiology is following  #HLD, chronic -Continue home gemfibrozil  #Anxiety, chronic -Continue home sertraline  Code status: Full code VTE ppx: SCDs Bowel regimen: Senna twice daily Diet: Heart healthy PT/OT recs: None at this time  Cato Mulligan, MD 10/28/2020, 11:29 AM Pager: [phone] After 5pm on weekdays and 1pm on weekends: On Call pager [phone]

## 2020-10-29 ENCOUNTER — Inpatient Hospital Stay (HOSPITAL_COMMUNITY): Payer: Medicare Other

## 2020-10-29 ENCOUNTER — Other Ambulatory Visit (HOSPITAL_COMMUNITY): Payer: Medicare Other

## 2020-10-29 LAB — BASIC METABOLIC PANEL
Anion gap: 13 (ref 5–15)
BUN: 51 mg/dL — ABNORMAL HIGH (ref 8–23)
CO2: 20 mmol/L — ABNORMAL LOW (ref 22–32)
Calcium: 9.1 mg/dL (ref 8.9–10.3)
Chloride: 103 mmol/L (ref 98–111)
Creatinine, Ser: 3.9 mg/dL — ABNORMAL HIGH (ref 0.44–1.00)
GFR, Estimated: 12 mL/min — ABNORMAL LOW (ref >60)
Glucose, Bld: 115 mg/dL — ABNORMAL HIGH (ref 70–99)
Potassium: 4.5 mmol/L (ref 3.5–5.1)
Sodium: 136 mmol/L (ref 135–145)

## 2020-10-29 LAB — CBC
HCT: 28.4 % — ABNORMAL LOW (ref 36.0–46.0)
HCT: 30.7 % — ABNORMAL LOW (ref 36.0–46.0)
HCT: 30.1 % — ABNORMAL LOW (ref 36.0–46.0)
HCT: 28.5 % — ABNORMAL LOW (ref 36.0–46.0)
Hemoglobin: 8.9 g/dL — ABNORMAL LOW (ref 12.0–15.0)
Hemoglobin: 9.5 g/dL — ABNORMAL LOW (ref 12.0–15.0)
Hemoglobin: 9.4 g/dL — ABNORMAL LOW (ref 12.0–15.0)
Hemoglobin: 8.9 g/dL — ABNORMAL LOW (ref 12.0–15.0)
MCH: 30.8 pg (ref 26.0–34.0)
MCH: 30.2 pg (ref 26.0–34.0)
MCH: 30.5 pg (ref 26.0–34.0)
MCH: 30.5 pg (ref 26.0–34.0)
MCHC: 31.3 g/dL (ref 30.0–36.0)
MCHC: 30.9 g/dL (ref 30.0–36.0)
MCHC: 31.2 g/dL (ref 30.0–36.0)
MCHC: 31.2 g/dL (ref 30.0–36.0)
MCV: 98.3 fL (ref 80.0–100.0)
MCV: 97.5 fL (ref 80.0–100.0)
MCV: 97.7 fL (ref 80.0–100.0)
MCV: 97.6 fL (ref 80.0–100.0)
Platelets: 188 10*3/uL (ref 150–400)
Platelets: 175 10*3/uL (ref 150–400)
Platelets: 190 10*3/uL (ref 150–400)
Platelets: 195 10*3/uL (ref 150–400)
RBC: 2.89 MIL/uL — ABNORMAL LOW (ref 3.87–5.11)
RBC: 3.15 MIL/uL — ABNORMAL LOW (ref 3.87–5.11)
RBC: 3.08 MIL/uL — ABNORMAL LOW (ref 3.87–5.11)
RBC: 2.92 MIL/uL — ABNORMAL LOW (ref 3.87–5.11)
RDW: 18 % — ABNORMAL HIGH (ref 11.5–15.5)
RDW: 17.8 % — ABNORMAL HIGH (ref 11.5–15.5)
RDW: 17.7 % — ABNORMAL HIGH (ref 11.5–15.5)
RDW: 17.4 % — ABNORMAL HIGH (ref 11.5–15.5)
WBC: 14.6 10*3/uL — ABNORMAL HIGH (ref 4.0–10.5)
WBC: 13.3 10*3/uL — ABNORMAL HIGH (ref 4.0–10.5)
WBC: 12.8 10*3/uL — ABNORMAL HIGH (ref 4.0–10.5)
WBC: 11.6 10*3/uL — ABNORMAL HIGH (ref 4.0–10.5)
nRBC: 0.1 % (ref 0.0–0.2)
nRBC: 0 % (ref 0.0–0.2)
nRBC: 0 % (ref 0.0–0.2)
nRBC: 0 % (ref 0.0–0.2)

## 2020-10-29 LAB — URINALYSIS, ROUTINE W REFLEX MICROSCOPIC
Bilirubin Urine: NEGATIVE
Glucose, UA: 50 mg/dL — AB
Hgb urine dipstick: NEGATIVE
Ketones, ur: NEGATIVE mg/dL
Nitrite: NEGATIVE
Protein, ur: 100 mg/dL — AB
Specific Gravity, Urine: 1.029 (ref 1.005–1.030)
WBC, UA: >50 WBC/hpf — ABNORMAL HIGH (ref 0–5)
pH: 5 (ref 5.0–8.0)

## 2020-10-29 LAB — TYPE AND SCREEN
ABO/RH(D): O POS
Antibody Screen: NEGATIVE
Unit division: 0
Unit division: 0

## 2020-10-29 LAB — BPAM RBC
Blood Product Expiration Date: 202111192359
Blood Product Expiration Date: 202112042359
ISSUE DATE / TIME: 202111021440
ISSUE DATE / TIME: 202111030740
Unit Type and Rh: 5100
Unit Type and Rh: 5100

## 2020-10-29 LAB — PROTEIN / CREATININE RATIO, URINE
Creatinine, Urine: 358.14 mg/dL
Protein Creatinine Ratio: 0.84 mg/mg{Cre} — ABNORMAL HIGH (ref 0.00–0.15)
Total Protein, Urine: 302 mg/dL

## 2020-10-29 LAB — SODIUM, URINE, RANDOM: Sodium, Ur: 74 mmol/L

## 2020-10-29 MED ORDER — NICOTINE 21 MG/24HR TD PT24
21.0000 mg | MEDICATED_PATCH | Freq: Every day | TRANSDERMAL | Status: DC
  Administered 2020-10-29 – 2020-11-01 (×4): 21 mg via TRANSDERMAL
  Filled 2020-10-29 (×5): qty 1

## 2020-10-29 MED ORDER — METOPROLOL TARTRATE 12.5 MG HALF TABLET
12.5000 mg | ORAL_TABLET | Freq: Four times a day (QID) | ORAL | Status: DC
  Administered 2020-10-29 (×3): 12.5 mg via ORAL
  Filled 2020-10-29 (×3): qty 1

## 2020-10-29 NOTE — Progress Notes (Signed)
1345: Radiology called asking if pt was good to come for Renal U/S.  1755: This RN called radiology because U/S had not been completed yet. Radiology tech stated would request for transport at this time. Will continue to monitor.

## 2020-10-29 NOTE — Progress Notes (Signed)
   10/29/20 2019  Assess: MEWS Score  BP (!) 80/54  Assess: MEWS Score  MEWS Temp 0  MEWS Systolic 2  MEWS Pulse 0  MEWS RR 0  MEWS LOC 0  MEWS Score 2  MEWS Score Color Yellow  Assess: if the MEWS score is Yellow or Red  Were vital signs taken at a resting state? Yes  Focused Assessment No change from prior assessment  Early Detection of Sepsis Score *See Row Information* Low  MEWS guidelines implemented *See Row Information* No, other (Comment) (pt asleep and had pain meds and anxiety meds earlier )  Document  Patient Outcome Stabilized after interventions  Progress note created (see row info) Yes

## 2020-10-29 NOTE — Consult Note (Signed)
Reason for Consult: AKI/CKD stage 3-4 Referring Physician: Dareen Piano, MD  Renee Griffin is an 64 y.o. female with a PMH significant for longstanding, poorly controlled HTN, chronic combined systolic and diastolic CHF, CAD s/p CABG, aortic stenosis s/p bioprosthetic valve replacement, A fib on Eliquis, h/o CVA, COPD, large left adrenal mass (concerning for pheochromocytoma or adrenal carcinoma), and CKD stage 3-4 who presented to [facility] ED with sudden onset left abdominal and flank pain.  In the ED she was hypertensive at 207/158, Cr 1.96, Hgb 11.8, and covid negative.  CT of abdomen and pelvis revealed enlargement of the known left adrenal mass along with hemorrhage in to the mass and retroperitoneum as well as ascites.  She was admitted for further management and evaluation.  Her hospitalization has been complicated by hemorrhagic shock with Hgb dropping down to 7.4 and BP dropping down to 78/52 and remaining in the 90's/60's.  She has received blood transfusions and her Hgb has improved slightly to 8.9.  Her eliquis was stopped and she was not a candidate for embolization due to her anticoagulation and CKD.  We were consulted due to the development of AKI/CKD.  The trend in Scr is seen below.    Trend in Creatinine: Creatinine, Ser  Date/Time Value Ref Range Status  10/29/2020 01:40 AM 3.90 (H) 0.44 - 1.00 mg/dL Final  10/28/2020 04:23 AM 2.78 (H) 0.44 - 1.00 mg/dL Final  10/27/2020 12:23 AM 1.93 (H) 0.44 - 1.00 mg/dL Final  10/26/2020 03:18 PM 2.00 (H) 0.44 - 1.00 mg/dL Final  10/26/2020 08:46 AM 1.96 (H) 0.44 - 1.00 mg/dL Final  08/24/2020 09:19 AM 1.78 (H) 0.44 - 1.00 mg/dL Final  07/19/2020 09:20 AM 2.07 (H) 0.44 - 1.00 mg/dL Final  07/13/2020 06:04 AM 1.88 (H) 0.44 - 1.00 mg/dL Final  06/24/2020 06:41 AM 2.12 (H) 0.44 - 1.00 mg/dL Final  06/23/2020 04:11 AM 1.32 (H) 0.44 - 1.00 mg/dL Final  06/22/2020 02:23 PM 1.63 (H) 0.44 - 1.00 mg/dL Final  05/27/2020 06:07 AM 2.41 (H) 0.44 - 1.00 mg/dL  Final  05/24/2020 05:24 AM 1.93 (H) 0.44 - 1.00 mg/dL Final  05/23/2020 05:50 PM 1.87 (H) 0.44 - 1.00 mg/dL Final  05/21/2020 04:40 AM 2.23 (H) 0.44 - 1.00 mg/dL Final  05/20/2020 05:55 AM 2.29 (H) 0.44 - 1.00 mg/dL Final  05/19/2020 08:21 AM 2.27 (H) 0.44 - 1.00 mg/dL Final  05/18/2020 06:14 AM 2.35 (H) 0.44 - 1.00 mg/dL Final  05/17/2020 01:35 AM 2.30 (H) 0.44 - 1.00 mg/dL Final  05/17/2020 01:00 AM 2.25 (H) 0.44 - 1.00 mg/dL Final  02/24/2020 09:25 AM 2.21 (H) 0.44 - 1.00 mg/dL Final  02/14/2020 06:48 AM 2.32 (H) 0.44 - 1.00 mg/dL Final  02/13/2020 03:46 AM 2.29 (H) 0.44 - 1.00 mg/dL Final  02/11/2020 05:05 AM 2.19 (H) 0.44 - 1.00 mg/dL Final  02/10/2020 02:33 AM 2.53 (H) 0.44 - 1.00 mg/dL Final  02/09/2020 02:51 AM 2.34 (H) 0.44 - 1.00 mg/dL Final  02/08/2020 02:57 AM 2.16 (H) 0.44 - 1.00 mg/dL Final  02/07/2020 02:41 PM 2.00 (H) 0.44 - 1.00 mg/dL Final  01/22/2020 04:11 AM 2.37 (H) 0.44 - 1.00 mg/dL Final  01/21/2020 04:24 AM 2.25 (H) 0.44 - 1.00 mg/dL Final  01/20/2020 06:04 AM 2.26 (H) 0.44 - 1.00 mg/dL Final  01/19/2020 03:25 PM 2.14 (H) 0.44 - 1.00 mg/dL Final  01/19/2020 12:50 PM 2.08 (H) 0.44 - 1.00 mg/dL Final  02/24/2019 05:13 PM 1.90 (H) 0.44 - 1.00 mg/dL Final  02/17/2019 04:48 PM 1.82 (  H) 0.44 - 1.00 mg/dL Final  12/24/2018 04:35 AM 1.39 (H) 0.44 - 1.00 mg/dL Final  12/22/2018 05:13 AM 1.18 (H) 0.44 - 1.00 mg/dL Final  12/20/2018 03:15 AM 1.20 (H) 0.44 - 1.00 mg/dL Final  12/17/2018 01:36 AM 1.83 (H) 0.44 - 1.00 mg/dL Final  12/16/2018 02:16 PM 1.79 (H) 0.44 - 1.00 mg/dL Final  12/16/2018 02:08 PM 1.80 (H) 0.44 - 1.00 mg/dL Final  05/18/2018 10:28 AM 2.00 (H) 0.44 - 1.00 mg/dL Final  04/15/2018 12:12 PM 1.43 (H) 0.44 - 1.00 mg/dL Final  04/14/2018 06:29 AM 1.93 (H) 0.44 - 1.00 mg/dL Final  04/13/2018 04:08 PM 1.85 (H) 0.44 - 1.00 mg/dL Final  02/23/2018 08:46 AM 1.26 (H) 0.44 - 1.00 mg/dL Final  02/09/2018 08:23 AM 1.81 (H) 0.44 - 1.00 mg/dL Final  02/07/2018 04:04  AM 1.84 (H) 0.44 - 1.00 mg/dL Final  02/05/2018 04:50 AM 2.02 (H) 0.44 - 1.00 mg/dL Final  02/04/2018 04:19 AM 1.91 (H) 0.44 - 1.00 mg/dL Final  02/03/2018 04:23 AM 2.27 (H) 0.44 - 1.00 mg/dL Final  02/02/2018 04:32 AM 2.69 (H) 0.44 - 1.00 mg/dL Final    PMH:   Past Medical History:  Diagnosis Date  . Depression   . Diabetes mellitus without complication ([place])   . Heart disease, congenital   . High cholesterol   . Hypertension   . Panic attack   . Stroke [place])     PSH:   Past Surgical History:  Procedure Laterality Date  . CARDIAC SURGERY  2011   CABG  . COLONOSCOPY WITH PROPOFOL N/A 02/08/2018   Procedure: COLONOSCOPY WITH PROPOFOL;  Surgeon: Otis Brace, MD;  Location: [facility];  Service: Gastroenterology;  Laterality: N/A;  . COLONOSCOPY WITH PROPOFOL N/A 12/16/2018   Procedure: COLONOSCOPY WITH PROPOFOL;  Surgeon: Ronald Lobo, MD;  Location: [facility];  Service: Endoscopy;  Laterality: N/A;  . DILATION AND EVACUATION     Patient states she had an abortion 30 or 40 years ago  . ESOPHAGOGASTRODUODENOSCOPY (EGD) WITH PROPOFOL Left 02/05/2018   Procedure: ESOPHAGOGASTRODUODENOSCOPY (EGD) WITH PROPOFOL;  Surgeon: Otis Brace, MD;  Location: [facility];  Service: Gastroenterology;  Laterality: Left;  . IR ANGIOGRAM SELECTIVE EACH ADDITIONAL VESSEL  12/18/2018  . IR ANGIOGRAM SELECTIVE EACH ADDITIONAL VESSEL  12/18/2018  . IR ANGIOGRAM SELECTIVE EACH ADDITIONAL VESSEL  12/18/2018  . IR ANGIOGRAM SELECTIVE EACH ADDITIONAL VESSEL  12/18/2018  . IR ANGIOGRAM VISCERAL SELECTIVE  12/18/2018  . IR EMBO ART  VEN HEMORR LYMPH EXTRAV  INC GUIDE ROADMAPPING  12/18/2018  . IR US GUIDE VASC ACCESS RIGHT  12/18/2018  . LEFT HEART CATH AND CORONARY ANGIOGRAPHY N/A 02/10/2020   Procedure: LEFT HEART CATH AND CORONARY ANGIOGRAPHY;  Surgeon: Dixie Dials, MD;  Location: [facility];  Service: Cardiovascular;  Laterality: N/A;  . WISDOM TOOTH EXTRACTION       Allergies:  Allergies  Allergen Reactions  . Amoxicillin-Pot Clavulanate Diarrhea    Has patient had a PCN reaction causing immediate rash, facial/tongue/throat swelling, SOB or lightheadedness with hypotension: Unknown Has patient had a PCN reaction causing severe rash involving mucus membranes or skin necrosis:UnknoWN Has patient had a PCN reaction that required hospitalization: Unknown Has patient had a PCN reaction occurring within the last 10 years: Unknown If all of the above answers are "NO", then may proceed with Cephalosporin use.   . Bupropion Nausea Only  . Nicotine Nausea Only and Rash    Reaction to patches    Medications:  Prior to Admission medications   Medication Sig Start Date End Date Taking? Authorizing Provider  acetaminophen (TYLENOL) 325 MG tablet Take 2 tablets (650 mg total) by mouth every 4 (four) hours as needed for headache or mild pain. 01/22/20  Yes Dixie Dials, MD  albuterol (VENTOLIN HFA) 108 (90 Base) MCG/ACT inhaler Inhale 2 puffs into the lungs every 6 (six) hours as needed for wheezing or shortness of breath. 01/22/20  Yes Dixie Dials, MD  allopurinol (ZYLOPRIM) 100 MG tablet Take 1 tablet (100 mg total) by mouth daily. 07/13/20  Yes Dorie Rank, MD  ALPRAZolam Duanne Moron) 0.5 MG tablet Take 1 tablet (0.5 mg total) by mouth 2 (two) times daily as needed for anxiety. Patient taking differently: Take 0.5 mg by mouth 3 (three) times daily.  02/24/20  Yes Virgel Manifold, MD  apixaban (ELIQUIS) 2.5 MG TABS tablet Take 1 tablet (2.5 mg total) by mouth 2 (two) times daily. 07/13/20  Yes Dorie Rank, MD  aspirin EC 81 MG tablet Take 1 tablet (81 mg total) by mouth daily. 01/22/20 01/21/21 Yes Dixie Dials, MD  cholecalciferol (VITAMIN D3) 25 MCG (1000 UNIT) tablet Take 1 tablet (1,000 Units total) by mouth daily. 07/13/20  Yes Dorie Rank, MD  digoxin Fonnie Birkenhead) 0.125 MG tablet Take 1 tablet (0.125 mg total) by mouth every Monday, Wednesday, and Friday. 05/29/20  Yes  Dixie Dials, MD  diltiazem (CARDIZEM CD) 120 MG 24 hr capsule Take 1 capsule (120 mg total) by mouth daily. 05/28/20  Yes Dixie Dials, MD  ferrous sulfate 325 (65 FE) MG tablet Take 1 tablet (325 mg total) by mouth daily with breakfast. 07/13/20  Yes Dorie Rank, MD  gemfibrozil (LOPID) 600 MG tablet Take 600 mg by mouth 2 (two) times daily. 02/19/18  Yes [provider]  loperamide (IMODIUM) 2 MG capsule Take 1 capsule (2 mg total) by mouth 4 (four) times daily as needed for diarrhea or loose stools. Patient taking differently: Take 2 mg by mouth daily as needed for diarrhea or loose stools.  02/24/19  Yes Julianne Rice, MD  metoprolol tartrate (LOPRESSOR) 25 MG tablet Take 0.5 tablets (12.5 mg total) by mouth 2 (two) times daily. 12/25/18  Yes Dixie Dials, MD  Multiple Vitamin (MULTIVITAMIN WITH MINERALS) TABS tablet Take 1 tablet by mouth daily.   Yes [provider]  nitroGLYCERIN (NITROSTAT) 0.4 MG SL tablet Place 1 tablet (0.4 mg total) under the tongue every 5 (five) minutes x 3 doses as needed for chest pain. 02/14/20  Yes Dixie Dials, MD  oxymetazoline (AFRIN) 0.05 % nasal spray Place 1 spray into both nostrils 2 (two) times daily.   Yes [provider]  pantoprazole (PROTONIX) 40 MG tablet Take 1 tablet (40 mg total) by mouth 2 (two) times daily. Patient taking differently: Take 40 mg by mouth 2 (two) times daily as needed (heartburn).  02/09/18  Yes Dixie Dials, MD  promethazine (PHENERGAN) 25 MG tablet Take 1 tablet (25 mg total) by mouth every 6 (six) hours as needed for nausea or vomiting. 07/13/20  Yes Dorie Rank, MD  sertraline (ZOLOFT) 100 MG tablet Take 100 mg by mouth at bedtime.    Yes [provider]  tiZANidine (ZANAFLEX) 2 MG tablet Take 1 tablet (2 mg total) by mouth at bedtime as needed for muscle spasms. 12/25/18  Yes Dixie Dials, MD  torsemide (DEMADEX) 20 MG tablet Take 1 tablet (20 mg total) by mouth every Monday, Wednesday, and  Friday. 06/24/20  Yes Dixie Dials, MD  valACYclovir (VALTREX) 500 MG tablet Take 500 mg by mouth daily.   Yes [provider]  ACCU-CHEK AVIVA PLUS test strip See admin instructions. 01/29/16   [provider]  ACCU-CHEK SOFTCLIX LANCETS lancets See admin instructions. 01/30/16   [provider]  Blood Glucose Monitoring Suppl (ACCU-CHEK AVIVA PLUS) w/Device KIT See admin instructions. 01/29/16   [provider]  potassium chloride (KLOR-CON) 10 MEQ tablet Take 10 mEq by mouth daily. Patient not taking: Reported on 07/13/2020    [provider]    Inpatient medications: . ALPRAZolam  0.25 mg Oral Q8H  . digoxin  0.125 mg Oral Q M,W,F  . gemfibrozil  600 mg Oral BID  . metoprolol tartrate  12.5 mg Oral QID  . nicotine  21 mg Transdermal Daily  . prazosin  2 mg Oral TID  . senna  1 tablet Oral BID  . sertraline  100 mg Oral QHS    Discontinued Meds:   Medications Discontinued During This Encounter  Medication Reason  . sodium chloride 0.9 % bolus 500 mL   . prothrombin complex conc human (KCENTRA) IVPB 50 Units/kg   . HYDROmorphone (DILAUDID) injection 1 mg   . predniSONE (DELTASONE) 10 MG tablet Prescription never filled  . prazosin (MINIPRESS) 1 MG capsule Discontinued by provider  . furosemide (LASIX) 40 MG tablet   . ondansetron (ZOFRAN) 4 MG tablet   . metoprolol tartrate (LOPRESSOR) tablet 12.5 mg   . diltiazem (CARDIZEM) 125 mg in dextrose 5% 125 mL (1 mg/mL) infusion   . metoprolol tartrate (LOPRESSOR) tablet 25 mg   . diltiazem (CARDIZEM) tablet 60 mg   . metoprolol tartrate (LOPRESSOR) tablet 50 mg   . metoprolol tartrate (LOPRESSOR) tablet 25 mg   . diltiazem (CARDIZEM) 125 mg in dextrose 5% 125 mL (1 mg/mL) infusion   . HYDROmorphone (DILAUDID) injection 2 mg   . torsemide (DEMADEX) tablet 20 mg   . 0.9 %  sodium chloride infusion (Manually program via Guardrails IV Fluids)     Social History:  reports that she has been  smoking cigarettes. She has a 50.00 pack-year smoking history. She has never used smokeless tobacco. She reports that she does not drink alcohol and does not use drugs.  Family History:   Family History  Problem Relation Age of Onset  . Cancer Mother        uterian OR cervical  . Cancer Father        lung  . Leukemia Brother   . Cancer Sister        breast    Pertinent items are noted in HPI. Weight change: 3.1 kg  Intake/Output Summary (Last 24 hours) at 10/29/2020 1427 Last data filed at 10/28/2020 2100 Gross per 24 hour  Intake 240 ml  Output --  Net 240 ml   BP 105/74 (BP Location: Right Arm)   Pulse 90   Temp 97.8 F (36.6 C) (Oral)   Resp 18   Ht 5' 3" (1.6 m)   Wt 74 kg   SpO2 100%   BMI 28.90 kg/m  Vitals:   10/29/20 0400 10/29/20 0500 10/29/20 0600 10/29/20 1118  BP: 103/64 116/68 105/74   Pulse:   81 90  Resp:   18   Temp:   97.8 F (36.6 C)   TempSrc:   Oral   SpO2:   100%   Weight:      Height:         General appearance: no distress  and slowed mentation Head: Normocephalic, without obvious abnormality, atraumatic Resp: clear to auscultation bilaterally Cardio: regular rate and rhythm, S1, S2 normal, no murmur, click, rub or gallop GI: distended, +BS, soft, +tenderness to deep palpation greatest at LUQ, no rebound Extremities: extremities normal, atraumatic, no cyanosis or edema  Labs: Basic Metabolic Panel: Recent Labs  Lab 10/26/20 0846 10/26/20 1518 10/27/20 0023 10/28/20 0423 10/29/20 0140  NA 141 143 143 136 136  K 3.6 3.7 3.7 4.0 4.5  CL 108 109 110 103 103  CO2 17*  --  21* 20* 20*  GLUCOSE 163* 178* 142* 110* 115*  BUN 30* 41* 34* 42* 51*  CREATININE 1.96* 2.00* 1.93* 2.78* 3.90*  ALBUMIN 3.4*  --  3.1*  --   --   CALCIUM 10.2  --  9.6 9.0 9.1   Liver Function Tests: Recent Labs  Lab 10/26/20 0846 10/27/20 0023  AST 18 23  ALT 9 10  ALKPHOS 103 85  BILITOT 1.0 1.0  PROT 6.4* 5.7*  ALBUMIN 3.4* 3.1*   Recent Labs   Lab 10/26/20 0846  LIPASE 22   No results for input(s): AMMONIA in the last 168 hours. CBC: Recent Labs  Lab 10/28/20 1140 10/28/20 1552 10/29/20 0140 10/29/20 0745  WBC 17.6* 18.0* 14.6* 13.3*  NEUTROABS 15.8*  --   --   --   HGB 8.9* 9.5* 8.9* 9.5*  HCT 28.5* 29.8* 28.4* 30.7*  MCV 97.3 98.7 98.3 97.5  PLT 177 179 188 175   PT/INR: _0 (inr:5) Cardiac Enzymes: )No results for input(s): CKTOTAL, CKMB, CKMBINDEX, TROPONINI in the last 168 hours. CBG: No results for input(s): GLUCAP in the last 168 hours.  Iron Studies: No results for input(s): IRON, TIBC, TRANSFERRIN, FERRITIN in the last 168 hours.  Xrays/Other Studies: No results found.   Assessment/Plan: 1.  Oliguric, AKI/CKD stage 3-4- presumably due to ischemic ATN in setting of hemorrhagic shock and hypotension.  CT scan without hydronephrosis but does have non-obstructing stone in the left kidney.  She also admitted to taking NSAIDs before coming to the hospital.   1. She is starting to make some urine and her BP's are starting to stabilize.   2. No indication for dialysis at this time.   3. Will continue to follow daily Scr and UOP.     4. Avoid nephrotoxic agents and maintain BP 2. Hemorrhagic shock- Hgb and BP improving after transfusions.  Continue to follow.  1. Transfuse as needed. 3. Large left adrenal neoplasm with recurrent bleed- Urology following.  Will eventually need left adrenalectomy in 6-12 weeks per Dr. Tresa Moore. 4. Refractory HTN- likely due to adrenal tumor.  Now hypotensive.  Hold meds for now 5. Atrial fibrillation- chronic and was on eliquis but holding dilt and anticoagulation for now.  6. Bilateral renal cysts- concerning for low-grade cystic neoplasm.  Urology following.  7. Chronic combined systolic and diastolic CHF- stable   Governor [place]ooks Gaius Ishaq 10/29/2020, 2:27 PM

## 2020-10-29 NOTE — Progress Notes (Addendum)
Ref: Dixie Dials, MD   Subjective:  Awake. Eating breakfast/banana.  VS stable but heart rate increasing with soft blood pressure after IV fluids. Hgb appears stabilizing at 9.5 gm. Creatinine worsening continues with level of 3.90 mg. Potassium is now 4.5 mmol. Anxiety persist as she is off smoking. Metanephrine levels are still pending.   Objective:  Vital Signs in the last 24 hours: Temp:  [97.5 F (36.4 C)-98.7 F (37.1 C)] 97.8 F (36.6 C) (11/04 0600) Pulse Rate:  [66-87] 81 (11/04 0600) Cardiac Rhythm: Atrial fibrillation (11/04 0700) Resp:  [12-20] 18 (11/04 0600) BP: (90-116)/(50-77) 105/74 (11/04 0600) SpO2:  [97 %-100 %] 100 % (11/04 0600) Weight:  [74 kg] 74 kg (11/04 0308)  Physical Exam: BP Readings from Last 1 Encounters:  10/29/20 105/74     Wt Readings from Last 1 Encounters:  10/29/20 74 kg    Weight change: 3.1 kg Body mass index is 28.9 kg/m. HEENT: [place]/AT, Eyes-Brown, Conjunctiva-Pale pink, Sclera-Non-icteric Neck: No JVD, No bruit, Trachea midline. Lungs:  Clearing, Bilateral. Cardiac:  Irregular rhythm, normal S1 and S2, no S3. II/VI systolic murmur. Abdomen:  Soft, non-tender. BS present. Extremities:  trace edema present. No cyanosis. No clubbing. CNS: AxOx3, Cranial nerves grossly intact, moves all 4 extremities.  Skin: Warm and dry.   Intake/Output from previous day: 11/03 0701 - 11/04 0700 In: 689.3 [P.O.:360; Blood:329.3] Out: -     Lab Results: BMET    Component Value Date/Time   NA 136 10/29/2020 0140   NA 136 10/28/2020 0423   NA 143 10/27/2020 0023   K 4.5 10/29/2020 0140   K 4.0 10/28/2020 0423   K 3.7 10/27/2020 0023   CL 103 10/29/2020 0140   CL 103 10/28/2020 0423   CL 110 10/27/2020 0023   CO2 20 (L) 10/29/2020 0140   CO2 20 (L) 10/28/2020 0423   CO2 21 (L) 10/27/2020 0023   GLUCOSE 115 (H) 10/29/2020 0140   GLUCOSE 110 (H) 10/28/2020 0423   GLUCOSE 142 (H) 10/27/2020 0023   BUN 51 (H) 10/29/2020 0140   BUN 42  (H) 10/28/2020 0423   BUN 34 (H) 10/27/2020 0023   CREATININE 3.90 (H) 10/29/2020 0140   CREATININE 2.78 (H) 10/28/2020 0423   CREATININE 1.93 (H) 10/27/2020 0023   CALCIUM 9.1 10/29/2020 0140   CALCIUM 9.0 10/28/2020 0423   CALCIUM 9.6 10/27/2020 0023   GFRNONAA 12 (L) 10/29/2020 0140   GFRNONAA 18 (L) 10/28/2020 0423   GFRNONAA 28 (L) 10/27/2020 0023   GFRAA 34 (L) 08/24/2020 0919   GFRAA 29 (L) 07/19/2020 0920   GFRAA 32 (L) 07/13/2020 0604   CBC    Component Value Date/Time   WBC 13.3 (H) 10/29/2020 0745   RBC 3.15 (L) 10/29/2020 0745   HGB 9.5 (L) 10/29/2020 0745   HCT 30.7 (L) 10/29/2020 0745   PLT 175 10/29/2020 0745   MCV 97.5 10/29/2020 0745   MCH 30.2 10/29/2020 0745   MCHC 30.9 10/29/2020 0745   RDW 17.8 (H) 10/29/2020 0745   LYMPHSABS 0.5 (L) 10/28/2020 1140   MONOABS 1.1 (H) 10/28/2020 1140   EOSABS 0.0 10/28/2020 1140   BASOSABS 0.0 10/28/2020 1140   HEPATIC Function Panel Recent Labs    08/24/20 0919 10/26/20 0846 10/27/20 0023  PROT 6.7 6.4* 5.7*   HEMOGLOBIN A1C No components found for: HGA1C,  MPG CARDIAC ENZYMES Lab Results  Component Value Date   CKTOTAL 735 (H) 02/01/2018   BNP No results for input(s): PROBNP in  the last 8760 hours. TSH Recent Labs    01/19/20 1250 05/17/20 1734  TSH 2.243 1.710   CHOLESTEROL Recent Labs    02/08/20 0257  CHOL 202*    Scheduled Meds: . ALPRAZolam  0.25 mg Oral Q8H  . digoxin  0.125 mg Oral Q M,W,F  . gemfibrozil  600 mg Oral BID  . metoprolol tartrate  12.5 mg Oral QID  . prazosin  2 mg Oral TID  . senna  1 tablet Oral BID  . sertraline  100 mg Oral QHS   Continuous Infusions: PRN Meds:.HYDROmorphone (DILAUDID) injection, oxyCODONE  Assessment/Plan: Chronic atrial fibrillation Abdominal pain Left renal mass Pelvic mass Retroperitoneal bleed Anemia of blood loss, symptomatic Anemia of chronic disease Acute kidney injury on CKD, IV Tobacco use disorder H/O HTN Anxiety  Add very  small dose beta-blocker with guideline to hold if BP is low. Continue full alpha blockade. Add nicotine patch for increasing anxiety. Continue low dose Xanax. Increase activity as tolerated. Change diet to renal diet. Awaiting renal consult for worsening renal function. Awaiting adrenal tumor surgery off Eliquis x 3-4 days.   LOS: 3 days   Time spent including chart review, lab review, examination, discussion with patient/Nurse/Resident : 30 min   Dixie Dials  MD  10/29/2020, 9:09 AM

## 2020-10-29 NOTE — Progress Notes (Signed)
Subjective:  Ms. Renee Renee Griffin is a 64 year old female with past medical history of chronic systolic and diastolic heart failure, chronic atrial fibrillation (on Eliquis,) CAD s/p CABG, aortic stenosis s/p bioprosthetic aortic valve, prior CVA of left cerebellum and pons, HTN, HLD, COPD, CKD IV, anxiety, and current tobacco use disorder who presented to [facility] on 10/26/2020 for evaluation of left flank pain found to have enlargement of known left adrenal mass with hemorrhage into neoplasm and retroperitoneum.  Overnight, patient received oxycodone 5mg  x1.  This morning, patient complains of feeling fatigue. She states that she does not currently have abdominal or flank pain unless she "punches" her side. She reports having regular bowel movements. Otherwise, she has no new complaints or concerns.  Objective:  Vital signs in last 24 hours: Vitals:   10/29/20 0308 10/29/20 0400 10/29/20 0500 10/29/20 0600  BP:  103/64 116/68 105/74  Pulse:    81  Resp:    18  Temp:    97.8 F (36.6 C)  TempSrc:    Oral  SpO2:    100%  Weight: 74 kg     Height:      SpO2: 100 % O2 Flow Rate (L/min): 3 L/min   Intake/Output Summary (Last 24 hours) at 10/29/2020 0707 Last data filed at 10/28/2020 2100 Gross per 24 hour  Intake 689.25 ml  Output --  Net 689.25 ml   Filed Weights   10/27/20 0305 10/28/20 0040 10/29/20 0308  Weight: 69.2 kg 70.9 kg 74 kg   Physical Exam Vitals and nursing note reviewed.  Constitutional:      General: She is not in acute distress.    Appearance: She is not ill-appearing.  HENT:     Head: Normocephalic and atraumatic.  Eyes:     Extraocular Movements: Extraocular movements intact.     Conjunctiva/sclera: Conjunctivae normal.  Cardiovascular:     Rate and Rhythm: Normal rate. Rhythm irregular.  Pulmonary:     Effort: No respiratory distress.     Breath sounds: Normal breath sounds.  Abdominal:     General: Bowel sounds are normal. There is no distension.      Tenderness: There is no abdominal tenderness.  Musculoskeletal:        General: Normal range of motion.     Cervical back: Normal range of motion and neck supple.     Right lower leg: No edema.     Left lower leg: No edema.  Skin:    General: Skin is warm and dry.     Capillary Refill: Capillary refill takes less than 2 seconds.  Neurological:     General: No focal deficit present.     Mental Status: Mental status is at baseline.    CBC Latest Ref Rng & Units 10/29/2020 10/28/2020 10/28/2020  WBC 4.0 - 10.5 K/uL 14.6(H) 18.0(H) 17.6(H)  Hemoglobin 12.0 - 15.0 g/dL 8.9(L) 9.5(L) 8.9(L)  Hematocrit 36 - 46 % 28.4(L) 29.8(L) 28.5(L)  Platelets 150 - 400 K/uL 188 179 177   CMP Latest Ref Rng & Units 10/29/2020 10/28/2020 10/27/2020  Glucose 70 - 99 mg/dL 115(H) 110(H) 142(H)  BUN 8 - 23 mg/dL 51(H) 42(H) 34(H)  Creatinine 0.44 - 1.00 mg/dL 3.90(H) 2.78(H) 1.93(H)  Sodium 135 - 145 mmol/L 136 136 143  Potassium 3.5 - 5.1 mmol/L 4.5 4.0 3.7  Chloride 98 - 111 mmol/L 103 103 110  CO2 22 - 32 mmol/L 20(L) 20(L) 21(L)  Calcium 8.9 - 10.3 mg/dL 9.1 9.0 9.6  Total Protein 6.5 - 8.1 g/dL - - 5.7(L)  Total Bilirubin 0.3 - 1.2 mg/dL - - 1.0  Alkaline Phos 38 - 126 U/L - - 85  AST 15 - 41 U/L - - 23  ALT 0 - 44 U/L - - 10   Metanephrines - in process  IMAGING: No new imaging  Assessment/Plan:  Principal Problem:   Retroperitoneal bleed Active Problems:   HTN (hypertension)   Dyslipidemia   Anxiety   Ovarian mass, left   Atrial fibrillation with RVR (HCC)   Adrenal mass greater than 4 cm in diameter (HCC)   Acute blood loss anemia   CKD (chronic kidney disease) stage 4, GFR 15-29 ml/min (HCC)  Ms. Renee Renee Griffin. Renee Griffin is a 64 year old female with past medical history of chronic systolic and diastolic heart failure, chronic atrial fibrillation (on Eliquis,) CAD s/p CABG, aortic stenosis s/p bioprosthetic aortic valve, prior CVA of left cerebellum and pons, HTN, HLD, COPD, CKD IV,  anxiety, and current tobacco use disorder who presented to [facility] on 10/26/2020 for evaluation of left flank pain found to have enlargement of known left adrenal mass with hemorrhage into neoplasm and retroperitoneum.  #Hemorrhagic left adrenal mass, active Patient with known large left-sided adrenal mass noted on imaging in February of 2019 presenting with sudden onset of left-flank pain. Imaging on arrival reveals enlargement of the mass with hemorrhage into neoplasm itself as well as the retroperitoneum. Patient's mass has not been biopsied since initial finding in 2019, however pheochromocytoma and adrenal cortical carcinoma remain high on the differential. She received KCentra in the ED and her home anticoagulation is being held since admission.  11/2: Patient having gradually down trending hemoglobin, most recently to 7.4 likely secondary to bleed. Discussed case with IR who recommended to continue with medical management as patient has various risk factors (most notably her poor renal function) which limit the ability to undergo arteriogram and embolization. Discussed case with general surgery who recommend consulting urology as they had originally seen patient in 2019. 11/3: Patient continues to have gradually down trending hemoglobin following 1u PRBCs yesterday. Additionally, patient's blood pressure gradually down trending to 41Y systolic. Discussed case with Dr. Tresa Moore with Urology. Patient's hypotension likely multifactorial including initiation of alpha-1 antagonist, ongoing bleed, continuation of diltiazem drip and metoprolol. Despite this, prazosin should be continued given possibility of emergent adrenalectomy. 11/4: Patient's hemoglobin stable at 9.5 most recently. Blood pressure still mildly soft while taking alpha antagonist, however this is an expected response. At this point, patient's bleed may be resolving, however we will need to continue to monitor her hemoglobin and blood pressure  closely. -CBC Q8H, continue monitoring hemoglobin closely -Transfuse Hb <8 -Continue holding home Eliquis -Dr. Doylene Canard with cardiology following closely -IR following -Dr. Tresa Moore with Urology following  -Recommending left adrenalectomy in 6-12 weeks versus urgently if further decompensates  -If bleeding to become transfusion refractory, then either IR embolization or urology will take to OR  -Repeat metanephrines (pending)  -Prazosin PO Q8H -Oxycodone 5mg  tablet Q6H PRN for moderate to severe pain -Hydromorphone 1mg  IV Q4H PRN for severe pain  #Atrial fibrillation with RVR, chronic Patient's home medication regimen for this condition includes metoprolol 12.5mg  twice daily, diltiazem 120mg  daily, and Eliquis 2.5mg  twice daily. In the setting of her hemorrhagic left adrenal mass, we will continue to hold her home Eliquis and diltiazem. Following starting prazosin and holding home medications, patient has had mild elevation in heart rate.  -Dr. Doylene Canard with cardiology is following:  -  Recommendation to start low-dose metoprolol 12.5mg  four times daily (with guideline to hold if BP is low)  -Holding home diltiazem (in setting of borderline hypotension)  -Holding home Eliquis (in setting of bleed)  #Acute on chronic kidney disease stage 4 Patient with baseline creatinine of approximately 1.9 with gradually worsening renal function, most recently to 3.90 on today's labs. Likely secondary to alpha and beta blockade in setting of active bleed causing hypoperfusion to kidneys and resultant acute tubular necrosis. -Nephrology consulted, appreciate recommendations -Daily BMP -Renal diet  #Large pelvic cyst, active #Cystic areas in bilateral kidneys, active Patient noted to have 10 x 7cm cystic lesion in the pelvis on CT scan concerning low-grade cystic neoplasm as well as multiple cystic areas in bilateral kidneys. Similar findings noted in initial CT Abdomen/Pelvis in February of 2019. Although  further workup of these lesions is warranted, patient's current acute bleed is the primary focus at this time. -Further workup pending stabilization of acute bleed  -GYN consult  -MRI pelvis with and without contrast if clinically warranted  -Consider follow-up renal sonogram or multiphase CT when the patient is able  #Hypertension, chronic Patient mildly hypotensive to normotensive in the setting of continued prazosin use. -Prazosin 2mg  three times daily -Dr. Doylene Canard recommending restarting metoprolol 12.5mg  four times daily (with guideline to hold if BP is low) -Holding home diltiazem in setting of hypotension  #HFpEF, chronic Patient echo reveals EF of 50-55%. Patient does not appear volume overloaded on examination. -Continue home torsemide 20mg  Monday, Wednesday and Friday -Continue digoxin 0.125mg  Monday, Wednesday and Friday -3L O2 via nasal cannula currently, wean as tolerated -Dr. Doylene Canard with cardiology is following  #HLD, chronic -Continue home gemfibrozil  #Anxiety, chronic -Continue home sertraline -Continue Alprazolam 0.25mg  Q8H  #Tobacco Use Disorder, chronic -Nicotine patch  Code status: Full code VTE ppx: SCDs Bowel regimen: Senna twice daily Diet: Heart healthy PT/OT recs: None at this time  Cato Mulligan, MD 10/29/2020, 7:07 AM Pager: [phone] After 5pm on weekdays and 1pm on weekends: On Call pager [phone]

## 2020-10-29 NOTE — Progress Notes (Signed)
Subjective/Chief Complaint:  1 - Large Left Adrenal Neoplasm With Recurrent Bleed / Suspect Pheochromocytoma or Adrenocortical Carcinoma - 15cm left adrenal neoplasm with mostly internal hemorrhage by ER CT 11/1. Similar episode 2019 managed conservatively. Endocrine eval 2019 with elevated metanephrines at 4X ULN and known refractory hypertension. Failed to follow up for management in 2019. Prior imaging with large aortic branch supply to mass.   Recent Course This Admission: 11/2 - 1u pRBC, started alpha blockade with prazosin  11/3 1u pRBC Hgb 7.9 --> 8.9 11/4 Hgb 9.5  2 - Acute on Chronic Stage 4 Renal Insufficiency - Cr 2s with GFR 25-30. No hydro on imaging x several, Known severe HTN x years. Cr worsen to 4s this admission in setting of some anemia and BP lability. No sig hydro on imaging this admission.   Today "Renee Griffin" is stabilizing. Much lest abdominal-flank pain. Hgb stabilizing. GFR remains acutely worse, but K acceptable.    Objective: Vital signs in last 24 hours: Temp:  [97.5 F (36.4 C)-98.2 F (36.8 C)] 97.8 F (36.6 C) (11/04 0600) Pulse Rate:  [71-90] 90 (11/04 1118) Resp:  [12-20] 18 (11/04 0600) BP: (93-116)/(50-77) 105/74 (11/04 0600) SpO2:  [97 %-100 %] 100 % (11/04 0600) Weight:  [74 kg] 74 kg (11/04 0308) Last BM Date: 10/28/20  Intake/Output from previous day: 11/03 0701 - 11/04 0700 In: 689.3 [P.O.:360; Blood:329.3] Out: -  Intake/Output this shift: No intake/output data recorded.  EXAM: AOx3, better mental status today. Eating lunch.  HR 90s, SBP low 100s Non-labored breathing on  O2 Less abd pain / LUQ TTP Stable truncal obesity No c/c/e  Lab Results:  Recent Labs    10/29/20 0140 10/29/20 0745  WBC 14.6* 13.3*  HGB 8.9* 9.5*  HCT 28.4* 30.7*  PLT 188 175   BMET Recent Labs    10/28/20 0423 10/29/20 0140  NA 136 136  K 4.0 4.5  CL 103 103  CO2 20* 20*  GLUCOSE 110* 115*  BUN 42* 51*  CREATININE 2.78* 3.90*   CALCIUM 9.0 9.1   PT/INR Recent Labs    10/27/20 0023  LABPROT 16.1*  INR 1.3*   ABG No results for input(s): PHART, HCO3 in the last 72 hours.  Invalid input(s): PCO2, PO2  Studies/Results: No results found.  Anti-infectives: Anti-infectives (From admission, onward)   None      Assessment/Plan:   1 - Large Left Adrenal Neoplasm With Recurrent Bleed / Suspect Pheochromocytoma or Adrenocortical Carcinoma - difficult and dangerous problem in acute and chronic setting. Ideally, rec BP control especially with alpha blockers as most effective given endocrine active adrenal neoplasm, PRN transfusion, and then elective left adrenalectomy in 6-12 weeks. Should her bleeding become transfusion refracotry, then either IR embolization of her likely dominant aortic branch adrenal vessel (risk of contrast nephropathy) or surgery this admission as next best options. Fortunately this seems unlikely.   She will  go through phase of needing volume/RBC expansion as alpha blockade sets in. This will make future surgery MUCH safer as she will have much more hemodynamic reserve after adrenal removed and she loses the abnormal alpha stimulation.  Ideally continue alpha blockade, and even possibly eventually escalate pending her response   GREATLY appreciate medical / cardiology team comanagement.   2 - Stage 4 Renal Insufficiency - Medical renal disease likely form HTN (made worse from above), likely chronic intravascular volume depletion, and DM2.  STRONGLY encouraged patient to follow up for these matters as they are life threatening  chronically as well. She appears to have limited insight despite several conversations and answering questions as best I can.   Will follow, Please call me directly anytime.   Alexis Frock 10/29/2020

## 2020-10-29 NOTE — Progress Notes (Signed)
   10/29/20 2116  Assess: MEWS Score  BP (!) 89/54  Pulse Rate 77  ECG Heart Rate 71  Resp 11  SpO2 99 %  Assess: MEWS Score  MEWS Temp 0  MEWS Systolic 1  MEWS Pulse 0  MEWS RR 1  MEWS LOC 0  MEWS Score 2  MEWS Score Color Yellow  Assess: if the MEWS score is Yellow or Red  Were vital signs taken at a resting state? Yes  Focused Assessment No change from prior assessment  Early Detection of Sepsis Score *See Row Information* Low  MEWS guidelines implemented *See Row Information* No, previously yellow, continue vital signs every 4 hours  Document  Patient Outcome Stabilized after interventions  Progress note created (see row info) Yes

## 2020-10-30 ENCOUNTER — Inpatient Hospital Stay (HOSPITAL_COMMUNITY): Payer: Medicare Other

## 2020-10-30 LAB — BASIC METABOLIC PANEL
Anion gap: 13 (ref 5–15)
BUN: 65 mg/dL — ABNORMAL HIGH (ref 8–23)
CO2: 20 mmol/L — ABNORMAL LOW (ref 22–32)
Calcium: 8.8 mg/dL — ABNORMAL LOW (ref 8.9–10.3)
Chloride: 102 mmol/L (ref 98–111)
Creatinine, Ser: 4.81 mg/dL — ABNORMAL HIGH (ref 0.44–1.00)
GFR, Estimated: 9 mL/min — ABNORMAL LOW (ref >60)
Glucose, Bld: 118 mg/dL — ABNORMAL HIGH (ref 70–99)
Potassium: 4.6 mmol/L (ref 3.5–5.1)
Sodium: 135 mmol/L (ref 135–145)

## 2020-10-30 LAB — CBC
HCT: 31 % — ABNORMAL LOW (ref 36.0–46.0)
HCT: 29.4 % — ABNORMAL LOW (ref 36.0–46.0)
Hemoglobin: 9.6 g/dL — ABNORMAL LOW (ref 12.0–15.0)
Hemoglobin: 9.1 g/dL — ABNORMAL LOW (ref 12.0–15.0)
MCH: 30.2 pg (ref 26.0–34.0)
MCH: 30.2 pg (ref 26.0–34.0)
MCHC: 31 g/dL (ref 30.0–36.0)
MCHC: 31 g/dL (ref 30.0–36.0)
MCV: 97.5 fL (ref 80.0–100.0)
MCV: 97.7 fL (ref 80.0–100.0)
Platelets: 192 10*3/uL (ref 150–400)
Platelets: 206 10*3/uL (ref 150–400)
RBC: 3.18 MIL/uL — ABNORMAL LOW (ref 3.87–5.11)
RBC: 3.01 MIL/uL — ABNORMAL LOW (ref 3.87–5.11)
RDW: 17.3 % — ABNORMAL HIGH (ref 11.5–15.5)
RDW: 17.2 % — ABNORMAL HIGH (ref 11.5–15.5)
WBC: 11.5 10*3/uL — ABNORMAL HIGH (ref 4.0–10.5)
WBC: 10.6 10*3/uL — ABNORMAL HIGH (ref 4.0–10.5)
nRBC: 0 % (ref 0.0–0.2)
nRBC: 0 % (ref 0.0–0.2)

## 2020-10-30 LAB — CORTISOL: Cortisol, Plasma: 18.4 ug/dL

## 2020-10-30 MED ORDER — SODIUM CHLORIDE 0.9 % IV SOLN
1.0000 g | INTRAVENOUS | Status: AC
  Administered 2020-10-31 – 2020-11-03 (×4): 1 g via INTRAVENOUS
  Filled 2020-10-30 (×3): qty 10

## 2020-10-30 MED ORDER — OXYMETAZOLINE HCL 0.05 % NA SOLN
1.0000 | Freq: Two times a day (BID) | NASAL | Status: DC | PRN
  Administered 2020-10-30: 1 via NASAL
  Filled 2020-10-30: qty 30

## 2020-10-30 MED ORDER — SODIUM CHLORIDE 0.9 % IV SOLN
1.0000 g | INTRAVENOUS | Status: DC
  Administered 2020-10-30: 1 g via INTRAVENOUS
  Filled 2020-10-30: qty 10

## 2020-10-30 NOTE — Progress Notes (Addendum)
Nephrology Follow-Up Consult note   Assessment/Recommendations: Renee Griffin is a/an 64 y.o. female with a past medical history significant for CHF, atrial fibrillation, CAD status post CABG, aortic stenosis status post valve replacement, CVA, HTN, HLD, COPD, CKD, tobacco use disorder, adrenal mass possibly pheochromocytoma, admitted for left adrenal mass hemorrhage.     1. Oliguric, AKI/CKD stage 3-4- presumably due to ischemic ATN in setting of hemorrhagic shock and hypotension.  CT scan without hydronephrosis but does have non-obstructing stone in the left kidney.  She also admitted to taking NSAIDs before coming to the hospital.   1. Urine output not well documented, continue to monitor 2. No indication for dialysis at this time but creatinine is rising.  Briefly discussed potential need for dialysis.  Patient was very overwhelmed.  Palliative care involvement may be prudent 3. Strict intake and output, monitor creatinine daily  4. Avoid nephrotoxic agents and maintain BP 2. Hemorrhagic shock- Hgb and BP improving after transfusions.  Continue to follow.  1. Transfuse as needed. 3. Hypotension: Overall improved. Was preceded by hypertension.  Given longstanding functional adrenal mass she could have had contralateral adrenal atrophy and hemorrhage causing immediate catecholamine release leading to acute hypertension followed by [name] causing hypotension and relative adrenal insufficiency. Cortisol normal this morning and BP improving so no need for hydrocort. Consideration purely academic at this point but if hypotension recurs or worsens stim test could be considered. 4. Large left adrenal neoplasm with recurrent bleed- Urology following.  Will eventually need left adrenalectomy in 6-12 weeks  5. Refractory HTN- likely due to adrenal tumor. Hypotension as above. Meds per priamry 6. Atrial fibrillation- chronic and was on eliquis but holding dilt and anticoagulation  for now.  7. Bilateral renal cysts- concerning for low-grade cystic neoplasm.  Urology following.  8. Chronic combined systolic and diastolic CHF- stable 9. UTI - extensive pyuria possibly contributing to AKI. CTX x 5 days.   Recommendations conveyed to primary service.    [facility] 10/30/2020 11:50 AM  ___________________________________________________________  CC: AKI on CKD  Interval History/Subjective: Patient had a lot of difficulty answering questions this morning.  When asked about her urine output she says she feels like she peed some.  Does have some shortness of breath.  States that she is very confused.  Frequently coughing throughout the interview.   Medications:  Current Facility-Administered Medications  Medication Dose Route Frequency Provider Last Rate Last Admin  . ALPRAZolam Duanne Moron) tablet 0.25 mg  0.25 mg Oral Q8H Dixie Dials, MD   0.25 mg at 10/30/20 0927  . cefTRIAXone (ROCEPHIN) 1 g in sodium chloride 0.9 % 100 mL IVPB  1 g Intravenous Q24H Reesa Chew, MD 200 mL/hr at 10/30/20 0935 1 g at 10/30/20 0935  . digoxin (LANOXIN) tablet 0.125 mg  0.125 mg Oral Q M,W,F Christian, Rylee, MD   0.125 mg at 10/30/20 0927  . gemfibrozil (LOPID) tablet 600 mg  600 mg Oral BID Darrick Meigs, Rylee, MD   600 mg at 10/30/20 0928  . nicotine (NICODERM CQ - dosed in mg/24 hours) patch 21 mg  21 mg Transdermal Daily Dixie Dials, MD   21 mg at 10/30/20 0938  . oxyCODONE (Oxy IR/ROXICODONE) immediate release tablet 5 mg  5 mg Oral Q6H PRN Mitzi Hansen, MD   5 mg at 10/30/20 0927  . oxymetazoline (AFRIN) 0.05 % nasal spray 1 spray  1 spray Each Nare BID PRN Lacinda Axon, MD  1 spray at 10/30/20 0618  . prazosin (MINIPRESS) capsule 2 mg  2 mg Oral TID Alexis Frock, MD   2 mg at 10/30/20 8889  . senna (SENOKOT) tablet 8.6 mg  1 tablet Oral BID Darrick Meigs, Rylee, MD   8.6 mg at 10/30/20 0927  . sertraline (ZOLOFT) tablet 100 mg  100  mg Oral QHS Christian, Rylee, MD   100 mg at 10/28/20 2137      Review of Systems: 10 systems reviewed and negative except per interval history/subjective  Physical Exam: Vitals:   10/30/20 0928 10/30/20 1146  BP: (!) 137/93 129/73  Pulse:  93  Resp:  14  Temp:  98.6 F (37 C)  SpO2:  100%   Total I/O In: 120 [P.O.:120] Out: -   Intake/Output Summary (Last 24 hours) at 10/30/2020 1150 Last data filed at 10/30/2020 0950 Gross per 24 hour  Intake 220 ml  Output 0 ml  Net 220 ml   Constitutional: Chronically ill-appearing, lying in bed, coughing frequently ENMT: ears and nose without scars or lesions, dry mucous membranes CV: normal rate, no edema Respiratory: Bilateral chest rise, increased work of breathing Gastrointestinal: soft, tender Ernest to palpation diffusely, no guarding or rebound Skin: no visible lesions or rashes Psych: alert, judgment/insight difficult to assess but not significantly intact, oriented to person, place, time   Test Results I personally reviewed new and old clinical labs and radiology tests Lab Results  Component Value Date   NA 135 10/30/2020   K 4.6 10/30/2020   CL 102 10/30/2020   CO2 20 (L) 10/30/2020   BUN 65 (H) 10/30/2020   CREATININE 4.81 (H) 10/30/2020   CALCIUM 8.8 (L) 10/30/2020   ALBUMIN 3.1 (L) 10/27/2020

## 2020-10-30 NOTE — Progress Notes (Signed)
   10/30/20 0731  Assess: MEWS Score  Temp 97.6 F (36.4 C)  BP 123/87  Pulse Rate 77  ECG Heart Rate 74  Resp 12  SpO2 100 %  Assess: MEWS Score  MEWS Temp 0  MEWS Systolic 0  MEWS Pulse 0  MEWS RR 1  MEWS LOC 0  MEWS Score 1  MEWS Score Color Green  Assess: if the MEWS score is Yellow or Red  Were vital signs taken at a resting state? Yes  Focused Assessment No change from prior assessment  Early Detection of Sepsis Score *See Row Information* Low  MEWS guidelines implemented *See Row Information* No, other (Comment) (BP improved )  Document  Patient Outcome Stabilized after interventions  Progress note created (see row info) Yes

## 2020-10-30 NOTE — Care Management Important Message (Signed)
Important Message  Patient Details  Name: Renee Griffin MRN: [id number] Date of Birth: 1956/06/13   Medicare Important Message Given:  Yes     Shelda Altes 10/30/2020, 9:45 AM

## 2020-10-30 NOTE — Progress Notes (Signed)
Subjective:  [name] is a 64 year old female with past medical history of chronic systolic and diastolic heart failure, chronic atrial fibrillation (on Eliquis,) CAD s/p CABG, aortic stenosis s/p bioprosthetic aortic valve, prior CVA of left cerebellum and pons, HTN, HLD, COPD, CKD IV, anxiety, and current tobacco use disorder who presented to [facility] on 10/26/2020 for evaluation of left flank pain found to have enlargement of known left adrenal mass with hemorrhage into neoplasm and retroperitoneum.  Overnight, patient received 1mg  IV dilaudid for pain.  This morning, patient is alert and awake. Patient complains of discomfort of her right eye. She also complains of soreness of her left back, but states that it is not painful. She states that she continues to have difficulty understanding the severity of her conditions and inquires if she is undergoing surgery today. Explained to patient about possible adrenalectomy in 6-12 weeks as there is no urgent indication at this time.   Objective:  Vital signs in last 24 hours: Vitals:   10/30/20 0000 10/30/20 0022 10/30/20 0053 10/30/20 0400  BP: (!) 87/59 105/81 99/73   Pulse: 69     Resp: 16   13  Temp: (!) 97.5 F (36.4 C)   97.9 F (36.6 C)  TempSrc: Oral   Oral  SpO2:      Weight:    78.6 kg  Height:      SpO2: 99 % O2 Flow Rate (L/min): 2.5 L/min   Intake/Output Summary (Last 24 hours) at 10/30/2020 0645 Last data filed at 10/30/2020 0600 Gross per 24 hour  Intake 100 ml  Output --  Net 100 ml   Filed Weights   10/28/20 0040 10/29/20 0308 10/30/20 0400  Weight: 70.9 kg 74 kg 78.6 kg   Physical Exam Vitals and nursing note reviewed.  Constitutional:      General: She is not in acute distress.    Appearance: She is not ill-appearing.  HENT:     Head: Normocephalic and atraumatic.  Eyes:     Extraocular Movements: Extraocular movements intact.     Conjunctiva/sclera: Conjunctivae normal.  Cardiovascular:      Rate and Rhythm: Normal rate. Rhythm irregular.  Pulmonary:     Effort: No respiratory distress.     Breath sounds: Normal breath sounds.  Abdominal:     General: Bowel sounds are normal. There is no distension.     Tenderness: There is no abdominal tenderness.  Musculoskeletal:        General: Normal range of motion.     Cervical back: Normal range of motion and neck supple.     Right lower leg: No edema.     Left lower leg: No edema.  Skin:    General: Skin is warm and dry.     Capillary Refill: Capillary refill takes less than 2 seconds.  Neurological:     General: No focal deficit present.     Mental Status: Mental status is at baseline.    CBC Latest Ref Rng & Units 10/29/2020 10/29/2020 10/29/2020  WBC 4.0 - 10.5 K/uL 11.6(H) 12.8(H) 13.3(H)  Hemoglobin 12.0 - 15.0 g/dL 8.9(L) 9.4(L) 9.5(L)  Hematocrit 36 - 46 % 28.5(L) 30.1(L) 30.7(L)  Platelets 150 - 400 K/uL 195 190 175   CMP Latest Ref Rng & Units 10/30/2020 10/29/2020 10/28/2020  Glucose 70 - 99 mg/dL 118(H) 115(H) 110(H)  BUN 8 - 23 mg/dL 65(H) 51(H) 42(H)  Creatinine 0.44 - 1.00 mg/dL 4.81(H) 3.90(H) 2.78(H)  Sodium 135 - 145  mmol/L 135 136 136  Potassium 3.5 - 5.1 mmol/L 4.6 4.5 4.0  Chloride 98 - 111 mmol/L 102 103 103  CO2 22 - 32 mmol/L 20(L) 20(L) 20(L)  Calcium 8.9 - 10.3 mg/dL 8.8(L) 9.1 9.0  Total Protein 6.5 - 8.1 g/dL - - -  Total Bilirubin 0.3 - 1.2 mg/dL - - -  Alkaline Phos 38 - 126 U/L - - -  AST 15 - 41 U/L - - -  ALT 0 - 44 U/L - - -   Cortisol - 18.4 Urine sodium - 74 Creatinine, Urine - 358.14 Total protein, urine - 302 Protein / creatinine ratio - 0.84 UA - Large leukocytes, >50 WBC, many bacteria Metanephrines - in process  IMAGING: US RENAL  Result Date: 10/29/2020 CLINICAL DATA:  Acute kidney injury EXAM: RENAL / URINARY TRACT ULTRASOUND COMPLETE COMPARISON:  CT 10/26/2020 FINDINGS: Right Kidney: Renal measurements: 10.6 x 5.7 x 5.0 cm = volume: 158 mL. Multiple cysts measuring up to  2.7 cm. Diffusely increased echotexture. No hydronephrosis. Right adrenal lesion is again noted as seen on prior CT. Left Kidney: Renal measurements: 10.9 x 6.9 x 4.5 cm = volume: 177 mL. Midpole cyst measures 1.9 cm. Mildly increased echotexture. No hydronephrosis. Large left adrenal mass superior to the left kidney measures up to 15 cm. Bladder: Decompressed. Other: None. IMPRESSION: Bilateral adrenal masses as seen on prior CT. Increased echotexture within the kidneys compatible with chronic medical renal disease. No hydronephrosis. Electronically Signed   By: Rolm Baptise M.D.   On: 10/29/2020 20:14   Assessment/Plan:  Principal Problem:   Retroperitoneal bleed Active Problems:   HTN (hypertension)   Dyslipidemia   Anxiety   Ovarian mass, left   Atrial fibrillation with RVR (HCC)   Adrenal mass greater than 4 cm in diameter (HCC)   Acute blood loss anemia   CKD (chronic kidney disease) stage 4, GFR 15-29 ml/min (HCC)  [name] is a 64 year old female with past medical history of chronic systolic and diastolic heart failure, chronic atrial fibrillation (on Eliquis,) CAD s/p CABG, aortic stenosis s/p bioprosthetic aortic valve, prior CVA of left cerebellum and pons, HTN, HLD, COPD, CKD IV, anxiety, and current tobacco use disorder who presented to [facility] on 10/26/2020 for evaluation of left flank pain found to have enlargement of known left adrenal mass with hemorrhage into neoplasm and retroperitoneum.  #Hemorrhagic left adrenal mass, active Patient with known large left-sided adrenal mass noted on imaging in February of 2019 presenting with sudden onset of left-flank pain. Imaging on arrival reveals enlargement of the mass with hemorrhage into neoplasm itself as well as the retroperitoneum. Patient's mass has not been biopsied since initial finding in 2019, however pheochromocytoma and adrenal cortical carcinoma remain high on the differential. She received KCentra in the ED and her  home anticoagulation is being held since admission.  11/2: Patient having gradually down trending hemoglobin, most recently to 7.4 likely secondary to bleed. Discussed case with IR who recommended to continue with medical management as patient has various risk factors (most notably her poor renal function) which limit the ability to undergo arteriogram and embolization. Discussed case with general surgery who recommend consulting urology as they had originally seen patient in 2019. 11/3: Patient continues to have gradually down trending hemoglobin following 1u PRBCs yesterday. Additionally, patient's blood pressure gradually down trending to 01E systolic. Discussed case with Dr. Tresa Moore with Urology. Patient's hypotension likely multifactorial including initiation of alpha-1 antagonist, ongoing bleed, continuation of diltiazem  drip and metoprolol. Despite this, prazosin should be continued given possibility of emergent adrenalectomy. 11/4: Patient's hemoglobin stable at 9.5 most recently. Blood pressure still mildly soft while taking alpha antagonist, however this is an expected response. At this point, patient's bleed may be resolving, however we will need to continue to monitor her hemoglobin and blood pressure closely. 11/5: Hemoglobin stable 8.9 down from 9.4. Pain much improved. Blood pressure mildly depressed overnight following reinitiating metoprolol. -CBC Q8H, continue monitoring hemoglobin closely -Transfuse Hb <8 -Continue holding home Eliquis -Dr. Doylene Canard with cardiology following -Nephrology following -Dr. Tresa Moore with urology following  -Recommending left adrenalectomy in 6-12 weeks versus urgently if further decompensates  -If bleeding to become transfusion refractory, then either IR embolization or urology will take to OR  -Follow-up plasma metanephrines (pending)  -Prazosin PO Q8H -Oxycodone 5mg  tablet Q6H PRN for moderate to severe pain  #Atrial fibrillation with RVR,  chronic Patient's home medication regimen for this condition includes metoprolol 12.5mg  twice daily, diltiazem 120mg  daily, and Eliquis 2.5mg  twice daily. In the setting of her hemorrhagic left adrenal mass, we will continue to hold her home Eliquis and diltiazem. Following starting prazosin and holding home medications, patient's heart rate well controlled at this time.  -Holding home metoprolol in the setting of repeated episodes of hypotension upon restarting  -Holding home diltiazem  -Holding home Eliquis  #Acute on chronic kidney disease stage 4 Patient with baseline creatinine of approximately 1.9 with progressively worsening renal function, creatinine 4.81, 3.90, 2.78. Likely secondary to hemorrhagic shock resulting in acute tubular necrosis. Patient has minimal urine output of 100cc's over the past twenty four hours. Nephrology consulted on 10/29/2020, greatly appreciate recommendations. -Nephrology consulted, appreciate recommendations  -Hold antihypertensives in setting of recurrent episodes of hypotension  -Avoid nephrotoxic agents -Daily BMP -Renal diet  #Large pelvic cyst, active #Cystic areas in bilateral kidneys, active Patient noted to have 10 x 7cm cystic lesion in the pelvis on CT scan concerning low-grade cystic neoplasm as well as multiple cystic areas in bilateral kidneys. Similar findings noted in initial CT Abdomen/Pelvis in February of 2019. Although further workup of these lesions is warranted, patient's current acute bleed is the primary focus at this time. -Further workup pending stabilization of acute bleed  -GYN consult  -MRI pelvis with and without contrast if clinically warranted  -Consider follow-up renal sonogram or multiphase CT when the patient is able  #Hypertension, chronic Patient mildly hypotensive to normotensive in the setting of continued prazosin use. -Prazosin 2mg  three times daily -Holding home metoprolol -Holding home diltiazem -Continue  prazosin  #HFpEF, chronic Patient echo reveals EF of 50-55%. Patient does not appear volume overloaded on examination. -Discontinued home torsemide 20mg  Monday, Wednesday and Friday -Continue digoxin 0.125mg  Monday, Wednesday and Friday -2.5L O2 via nasal cannula currently, wean as tolerated -Dr. Doylene Canard with cardiology is following  #HLD, chronic -Continue home gemfibrozil  #Anxiety, chronic -Continue home sertraline -Continue Alprazolam 0.25mg  Q8H  #Tobacco Use Disorder, chronic -Nicotine patch  Code status: Full code VTE ppx: SCDs Bowel regimen: Senna twice daily Diet: Heart healthy PT/OT recs: None at this time  Cato Mulligan, MD 10/30/2020, 6:45 AM Pager: [phone] After 5pm on weekdays and 1pm on weekends: On Call pager [phone]

## 2020-10-30 NOTE — Consult Note (Signed)
Ref: Dixie Dials, MD   Subjective:  Awake. VS stable. Hgb is 9.6 gm. Unstable gait per nurse. Appreciate renal consult. Adrenal mass surgery in 2-3 months. Patient made aware of increasing supervised activity. Metanephrine levels have not resulted.   Objective:  Vital Signs in the last 24 hours: Temp:  [97.5 F (36.4 C)-98.3 F (36.8 C)] 97.6 F (36.4 C) (11/05 0731) Pulse Rate:  [69-101] 77 (11/05 0731) Cardiac Rhythm: Atrial fibrillation (11/05 0700) Resp:  [11-16] 12 (11/05 0731) BP: (80-137)/(54-93) 137/93 (11/05 0928) SpO2:  [98 %-100 %] 100 % (11/05 0731) Weight:  [78.6 kg] 78.6 kg (11/05 0400)  Physical Exam: BP Readings from Last 1 Encounters:  10/30/20 (!) 137/93     Wt Readings from Last 1 Encounters:  10/30/20 78.6 kg    Weight change: 4.6 kg Body mass index is 30.7 kg/m. HEENT: [place]/AT, Eyes-Brown, PERL, EOMI, Conjunctiva-Pale pink, Sclera-Non-icteric Neck: No JVD, No bruit, Trachea midline. Lungs:  Clearing, Bilateral. Cardiac: Irregular rhythm, normal S1 and S2, no S3. II/VI systolic murmur. Abdomen:  Soft, non-tender. BS present. Extremities:  No edema present. No cyanosis. No clubbing. CNS: AxOx3, Cranial nerves grossly intact, moves all 4 extremities.  Skin: Warm and dry.   Intake/Output from previous day: 11/04 0701 - 11/05 0700 In: 100 [P.O.:100] Out: 0     Lab Results: BMET    Component Value Date/Time   NA 135 10/30/2020 0200   NA 136 10/29/2020 0140   NA 136 10/28/2020 0423   K 4.6 10/30/2020 0200   K 4.5 10/29/2020 0140   K 4.0 10/28/2020 0423   CL 102 10/30/2020 0200   CL 103 10/29/2020 0140   CL 103 10/28/2020 0423   CO2 20 (L) 10/30/2020 0200   CO2 20 (L) 10/29/2020 0140   CO2 20 (L) 10/28/2020 0423   GLUCOSE 118 (H) 10/30/2020 0200   GLUCOSE 115 (H) 10/29/2020 0140   GLUCOSE 110 (H) 10/28/2020 0423   BUN 65 (H) 10/30/2020 0200   BUN 51 (H) 10/29/2020 0140   BUN 42 (H) 10/28/2020 0423   CREATININE 4.81 (H) 10/30/2020  0200   CREATININE 3.90 (H) 10/29/2020 0140   CREATININE 2.78 (H) 10/28/2020 0423   CALCIUM 8.8 (L) 10/30/2020 0200   CALCIUM 9.1 10/29/2020 0140   CALCIUM 9.0 10/28/2020 0423   GFRNONAA 9 (L) 10/30/2020 0200   GFRNONAA 12 (L) 10/29/2020 0140   GFRNONAA 18 (L) 10/28/2020 0423   GFRAA 34 (L) 08/24/2020 0919   GFRAA 29 (L) 07/19/2020 0920   GFRAA 32 (L) 07/13/2020 0604   CBC    Component Value Date/Time   WBC 11.5 (H) 10/30/2020 0806   RBC 3.18 (L) 10/30/2020 0806   HGB 9.6 (L) 10/30/2020 0806   HCT 31.0 (L) 10/30/2020 0806   PLT 192 10/30/2020 0806   MCV 97.5 10/30/2020 0806   MCH 30.2 10/30/2020 0806   MCHC 31.0 10/30/2020 0806   RDW 17.3 (H) 10/30/2020 0806   LYMPHSABS 0.5 (L) 10/28/2020 1140   MONOABS 1.1 (H) 10/28/2020 1140   EOSABS 0.0 10/28/2020 1140   BASOSABS 0.0 10/28/2020 1140   HEPATIC Function Panel Recent Labs    08/24/20 0919 10/26/20 0846 10/27/20 0023  PROT 6.7 6.4* 5.7*   HEMOGLOBIN A1C No components found for: HGA1C,  MPG CARDIAC ENZYMES Lab Results  Component Value Date   CKTOTAL 735 (H) 02/01/2018   BNP No results for input(s): PROBNP in the last 8760 hours. TSH Recent Labs    01/19/20 1250 05/17/20 1734  TSH 2.243 1.710   CHOLESTEROL Recent Labs    02/08/20 0257  CHOL 202*    Scheduled Meds: . ALPRAZolam  0.25 mg Oral Q8H  . digoxin  0.125 mg Oral Q M,W,F  . gemfibrozil  600 mg Oral BID  . nicotine  21 mg Transdermal Daily  . prazosin  2 mg Oral TID  . senna  1 tablet Oral BID  . sertraline  100 mg Oral QHS   Continuous Infusions: . cefTRIAXone (ROCEPHIN)  IV 1 g (10/30/20 0935)   PRN Meds:.oxyCODONE, oxymetazoline  Assessment/Plan: chronic atrial fibrillation, rate controlled. Abdominal pain Left adrenal mass Pelvic mass Retroperitoneal bleed Anemia of blood loss Anemia of chronic disease Acute kidney injury on CKD, IV Tobacco use disorder H/O HTN Anxiety  Increase supervised activity.   LOS: 4 days   Time  spent including chart review, lab review, examination, discussion with patient/Nurse : 30 min   Dixie Dials  MD  10/30/2020, 11:19 AM

## 2020-10-31 DIAGNOSIS — I4891 Unspecified atrial fibrillation: Secondary | ICD-10-CM

## 2020-10-31 DIAGNOSIS — Z7189 Other specified counseling: Secondary | ICD-10-CM

## 2020-10-31 DIAGNOSIS — N838 Other noninflammatory disorders of ovary, fallopian tube and broad ligament: Secondary | ICD-10-CM

## 2020-10-31 DIAGNOSIS — F419 Anxiety disorder, unspecified: Secondary | ICD-10-CM

## 2020-10-31 DIAGNOSIS — Z515 Encounter for palliative care: Secondary | ICD-10-CM

## 2020-10-31 DIAGNOSIS — E278 Other specified disorders of adrenal gland: Secondary | ICD-10-CM

## 2020-10-31 DIAGNOSIS — Z66 Do not resuscitate: Secondary | ICD-10-CM | POA: Diagnosis not present

## 2020-10-31 DIAGNOSIS — N184 Chronic kidney disease, stage 4 (severe): Secondary | ICD-10-CM

## 2020-10-31 DIAGNOSIS — N179 Acute kidney failure, unspecified: Secondary | ICD-10-CM

## 2020-10-31 LAB — CBC
HCT: 30.1 % — ABNORMAL LOW (ref 36.0–46.0)
Hemoglobin: 9.4 g/dL — ABNORMAL LOW (ref 12.0–15.0)
MCH: 30.1 pg (ref 26.0–34.0)
MCHC: 31.2 g/dL (ref 30.0–36.0)
MCV: 96.5 fL (ref 80.0–100.0)
Platelets: 180 10*3/uL (ref 150–400)
RBC: 3.12 MIL/uL — ABNORMAL LOW (ref 3.87–5.11)
RDW: 17.2 % — ABNORMAL HIGH (ref 11.5–15.5)
WBC: 9.7 10*3/uL (ref 4.0–10.5)
nRBC: 0 % (ref 0.0–0.2)

## 2020-10-31 LAB — BASIC METABOLIC PANEL
Anion gap: 14 (ref 5–15)
BUN: 84 mg/dL — ABNORMAL HIGH (ref 8–23)
CO2: 18 mmol/L — ABNORMAL LOW (ref 22–32)
Calcium: 8.7 mg/dL — ABNORMAL LOW (ref 8.9–10.3)
Chloride: 102 mmol/L (ref 98–111)
Creatinine, Ser: 5.6 mg/dL — ABNORMAL HIGH (ref 0.44–1.00)
GFR, Estimated: 8 mL/min — ABNORMAL LOW (ref >60)
Glucose, Bld: 99 mg/dL (ref 70–99)
Potassium: 4.9 mmol/L (ref 3.5–5.1)
Sodium: 134 mmol/L — ABNORMAL LOW (ref 135–145)

## 2020-10-31 LAB — URINE CULTURE

## 2020-10-31 MED ORDER — DIPHENHYDRAMINE-ZINC ACETATE 2-0.1 % EX CREA
TOPICAL_CREAM | Freq: Two times a day (BID) | CUTANEOUS | Status: DC | PRN
  Filled 2020-10-31 (×2): qty 28

## 2020-10-31 MED ORDER — ALPRAZOLAM 0.25 MG PO TABS
0.2500 mg | ORAL_TABLET | Freq: Three times a day (TID) | ORAL | Status: DC
  Administered 2020-10-31 – 2020-11-06 (×20): 0.25 mg via ORAL
  Filled 2020-10-31 (×20): qty 1

## 2020-10-31 MED ORDER — BISACODYL 10 MG RE SUPP
10.0000 mg | Freq: Every day | RECTAL | Status: DC | PRN
  Administered 2020-11-01: 10 mg via RECTAL
  Filled 2020-10-31 (×2): qty 1

## 2020-10-31 NOTE — Progress Notes (Signed)
Nephrology Follow-Up Consult note   Assessment/Recommendations: Renee Griffin is a/an 64 y.o. female with a past medical history significant for CHF, atrial fibrillation, CAD status post CABG, aortic stenosis status post valve replacement, CVA, HTN, HLD, COPD, CKD, tobacco use disorder, adrenal mass possibly pheochromocytoma, admitted for left adrenal mass hemorrhage.     1. Oliguric, AKI/CKD stage 3-4- presumably due to ischemic ATN in setting of hemorrhagic shock and hypotension.  CT scan without hydronephrosis but does have non-obstructing stone in the left kidney.  She also admitted to taking NSAIDs before coming to the hospital.   1. Urine output not well documented, continue to monitor 2. No indication for dialysis at this time.  Hopeful recovery with time 3. She is undecided about dialysis, appreciate palliative care involvement, continue conversations 4. Strict intake and output, monitor creatinine daily  5. Avoid nephrotoxic agents and maintain BP 2. Hemorrhagic shock- Hgb and BP improving after transfusions.  Continue to follow.  1. Transfuse as needed. 3. Hypotension: Overall improved.  Overall improved at this time.  Continue to monitor 4. Large left adrenal neoplasm with recurrent bleed- Urology following.  Will eventually need left adrenalectomy in 6-12 weeks.  Possible pheochromocytoma 5. Refractory HTN-history of such.  Blood pressure controlled at this current time 6. Atrial fibrillation- chronic and was on eliquis but holding dilt and anticoagulation for now.  7. Bilateral renal cysts- concerning for low-grade cystic neoplasm.  Urology following.  8. Chronic combined systolic and diastolic CHF- stable 9. UTI - extensive pyuria possibly contributing to AKI. CTX x 5 days.   Recommendations conveyed to primary service.    [facility] 10/31/2020 9:44 AM  ___________________________________________________________  CC: AKI on  CKD  Interval History/Subjective: Patient states she is doing okay today.  She denies any specific complaints.  We discussed her kidney status.  She feels that she is urinating more.  She denies confusion specifically.   Medications:  Current Facility-Administered Medications  Medication Dose Route Frequency Provider Last Rate Last Admin  . ALPRAZolam Duanne Moron) tablet 0.25 mg  0.25 mg Oral Q8H Narendra, Nischal, MD   0.25 mg at 10/31/20 0834  . bisacodyl (DULCOLAX) suppository 10 mg  10 mg Rectal Daily PRN Rosezella Rumpf, NP      . cefTRIAXone (ROCEPHIN) 1 g in sodium chloride 0.9 % 100 mL IVPB  1 g Intravenous Q24H Reesa Chew, MD 200 mL/hr at 10/31/20 0812 1 g at 10/31/20 0812  . digoxin (LANOXIN) tablet 0.125 mg  0.125 mg Oral Q M,W,F Christian, Rylee, MD   0.125 mg at 10/30/20 0927  . gemfibrozil (LOPID) tablet 600 mg  600 mg Oral BID Darrick Meigs, Rylee, MD   600 mg at 10/30/20 2342  . nicotine (NICODERM CQ - dosed in mg/24 hours) patch 21 mg  21 mg Transdermal Daily Dixie Dials, MD   21 mg at 10/30/20 0938  . oxyCODONE (Oxy IR/ROXICODONE) immediate release tablet 5 mg  5 mg Oral Q6H PRN Mitzi Hansen, MD   5 mg at 10/31/20 0834  . oxymetazoline (AFRIN) 0.05 % nasal spray 1 spray  1 spray Each Nare BID PRN Lacinda Axon, MD   1 spray at 10/30/20 0618  . prazosin (MINIPRESS) capsule 2 mg  2 mg Oral TID Alexis Frock, MD   2 mg at 10/30/20 2343  . senna (SENOKOT) tablet 8.6 mg  1 tablet Oral BID Darrick Meigs, Rylee, MD   8.6 mg at 10/30/20 2341  . sertraline (ZOLOFT) tablet  100 mg  100 mg Oral QHS Christian, Rylee, MD   100 mg at 10/30/20 2341      Review of Systems: 10 systems reviewed and negative except per interval history/subjective  Physical Exam: Vitals:   10/31/20 0400 10/31/20 0749  BP: 124/71 (!) 123/91  Pulse: 63 65  Resp: 14 13  Temp: 98.2 F (36.8 C) 97.7 F (36.5 C)  SpO2: 100% 100%   No intake/output data recorded.  Intake/Output Summary (Last  24 hours) at 10/31/2020 0944 Last data filed at 10/31/2020 0400 Gross per 24 hour  Intake 580 ml  Output 400 ml  Net 180 ml   Constitutional: Chronically ill-appearing, lying in bed, no distress ENMT: ears and nose without scars or lesions, dry mucous membranes CV: normal rate, no edema Respiratory: Bilateral chest rise, increased work of breathing Gastrointestinal: soft, nondistended Skin: no visible lesions or rashes Psych: alert, judgment/insight, oriented to person and place   Test Results I personally reviewed new and old clinical labs and radiology tests Lab Results  Component Value Date   NA 134 (L) 10/31/2020   K 4.9 10/31/2020   CL 102 10/31/2020   CO2 18 (L) 10/31/2020   BUN 84 (H) 10/31/2020   CREATININE 5.60 (H) 10/31/2020   CALCIUM 8.7 (L) 10/31/2020   ALBUMIN 3.1 (L) 10/27/2020

## 2020-10-31 NOTE — Consult Note (Addendum)
Palliative Medicine Inpatient Consult Note  Reason for consult:  Goals of Care "admitted for retroperitoneal bleed secondary to large left adrenal mass that is likely a pheochromocytoma that has been present since 2019. pt never sought follow up care and has poor health literacy"  HPI:  Per intake H&P --> Renee Griffin is a 64 year old female with past medical history ofchronic systolic and diastolic heart failure, chronic atrial fibrillation (on Eliquis,) CAD s/p CABG, aortic stenosis s/p bioprosthetic aortic valve, prior CVA of left cerebellum and pons,HTN,HLD,COPD, CKD IV, anxiety, and currenttobacco use disorder who presented to [facility] on 10/26/2020 for evaluation of left flank pain found to have enlargement of known left adrenal mass with hemorrhage into neoplasm and retroperitoneum.  Palliative care is asked to get involved in the setting of multiple co-morbid conditions and more acutely a retroperitoneal bleed 2/2 large adrenal mass suspected to be pheochromocytoma. Noted to have worsening renal function as well.   Clinical Assessment/Goals of Care: I have reviewed medical records including EPIC notes, labs and imaging, received report from bedside RN, assessed the patient who was lying in bed alert and oriented.    I met with Renee Griffin to further discuss diagnosis prognosis, GOC, EOL wishes, disposition and options.   I introduced Palliative Medicine as specialized medical care for people living with serious illness. It focuses on providing relief from the symptoms and stress of a serious illness. The goal is to improve quality of life for both the patient and the family.  I asked Renee Bradford - "Renee Griffin" to tell me about herself. She shares that she is from [place], [place]. She later moved to the [place] area and ended up living in [place]. She is a widow - her husband passed away a few years ago. 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 She  shares that there isn't much that brings her great joy as of recently. She does not consider herself to be overtly religious.  In regards to her living situation. Merline was living on her own prior to hospitalization. She has been able to do all bADL's & iADL's. She has some limitations regarding mobility and does require a cane for walking. In the past she has had a history of falling.   I asked Renee Griffin what her understanding of her present illness is. She shares that she knows she will need to undergo surgery for. I asked her if she wanted to undergo surgery and she clearly stated that she would. I shared with her that her functional limitations and other medical conditions may pose a situation of a difficult recovery. She does seem aware of this.   A detailed discussion was had today regarding advanced directives, patient has never completed these. I was able to provide a copy for her completion and will request chaplain to follow up on this.  Patient states that if unable to make decisions for herself she would wish for her brother, Renee Griffin to make decisions for her.   Concepts specific to code status, artifical feeding and hydration, continued IV antibiotics and rehospitalization was had. I completed a MOST form today. The patient and family outlined their wishes for the following treatment decisions:  Cardiopulmonary Resuscitation: Do Not Attempt Resuscitation (DNR/No CPR)  Medical Interventions: Limited Additional Interventions: Use medical treatment, IV fluids and cardiac monitoring as indicated, DO NOT USE intubation or mechanical ventilation. May consider use of less invasive airway support such as BiPAP or CPAP. Also provide comfort measures. Transfer  to the hospital if indicated. Avoid intensive care.   Antibiotics: Antibiotics if indicated  IV Fluids: IV fluids if indicated  Feeding Tube: Feeding tube for a defined trial period   The difference between a aggressive medical  intervention path  and a palliative comfort care path for this patient at this time was had. Values and goals of care important to patient and family were attempted to be elicited. At this point in time Deshaun would like to stay the course of treatment - thought we will aid in facilitating ongoing conversations. I have requested OP Palliative support as well.   Discussed the importance of continued conversation with family and their  medical providers regarding overall plan of care and treatment options, ensuring decisions are within the context of the patients values and GOCs. ______________________________________________________________________________________ Addendum: I met with "Renee Griffin" and her brother, Renee Griffin. We talked about her present condition - left adrenal mass and her kidney injury. I shared that the hope if for improvement over time of kidney function though if these do not recover it is important to know if Renee Griffin would or would not wish for HD. I explained that often this is a long term treatment which requires three times a week of transport to and from the HD clinic. I stated that often additional support is needed in the home thereafter as treatments can make one feel weakened. Patient has generalized weakness as it stands and there is certainly concern that the addition of HD would worsen her overall heath state. Renee Griffin and Renee Griffin would like to continue talking about this. The patient remains clear on wanting surgery which Renee Griffin is supportive of. I shared that there is much to consider which such interventions - the acute phase of the surgery and post operative course. There is also the consideration of after care. I asked if there is anyway Renee Griffin could call on friends or family for extra support in the home. She stated that she is the only person in her home and cannot rely on anyone else. I shared that I will reach out to social work to see what support options could possibly be provided  though often these would come out of pocket. We discussed advance care planning. Patient completed her POA and living will. We will request chaplain to notarize and update on Monday.   This evening bedside RN, Dianna and myself were able to get Kenza up to the bedside. She was notabley weak and required 1 person assistance from sit to stand. I anticipate she will need skilled nursing post hospitalization - PT eval is pending.    Decision Maker: Patient is able to make decisions for herself.  SUMMARY OF RECOMMENDATIONS   DNAR/DNI, patient would want a trial of tube feeding, she is undecided about dialysis though this is certainly a concern given her worsening renal function  DNR/MOST forms to be scanned into [employer]  Patient vocalizes being open to consideration of surgery at this point in time to see if her condition could be improved - I worry that a large surgery may result in burdensome recovery for her. I have called her brother, Renee Griffin to discuss this at greater lengths though I have not yet heard back. Ideally, we would all be able to sit and talk about what this may look like. It may be prudent to gain the insights of oncology regarding additional treatment she may require (if malignant) post surgically so that she may get a full picture of what she is looking  at.    If unable to complete a family meeting in house will have OP Palliative support follow up to further address these concerns.   Appreciate Chaplain aiding in completion of advance directives  Patient w/o a BM in several days. Will add a rescue laxative PRN  Patient is already on alprazolam and sertraline for GAD  Agree with oxycodone IR for pain - has required x3 doses in the last 24 hours. If needs increase could consider a long acting, though if surgery is elected in the future would also have acute post op pain management needs  PT evaluation today for generalized weakness  We will continue to follow along and aid in  support the patient and her family during this time  Code Status/Advance Care Planning: DNAR/DNI   Palliative Prophylaxis:   Oral Care, Constipation management, Mobility  Additional Recommendations (Limitations, Scope, Preferences):  Continue current scope of care   Psycho-social/Spiritual:   Desire for further Chaplaincy support:   Additional Recommendations:    Prognosis:   Discharge Planning: Unclear - will depend on clinical course  Review of Systems  Constitutional: Positive for malaise/fatigue.  Gastrointestinal: Positive for abdominal pain.   Oral Intake %:  75% I/O:  Urinating - not well documented Bowel Movements:  None since admission Mobility: PT will see today  Vitals:   10/30/20 2331 10/31/20 0400  BP:  124/71  Pulse:  63  Resp:  14  Temp: 98.3 F (36.8 C) 98.2 F (36.8 C)  SpO2:  100%    Intake/Output Summary (Last 24 hours) at 10/31/2020 7619 Last data filed at 10/31/2020 0400 Gross per 24 hour  Intake 580 ml  Output 400 ml  Net 180 ml   Last Weight  Most recent update: 10/30/2020  4:59 AM   Weight  78.6 kg (173 lb 4.5 oz)           Gen:  Older woman with a flat affect - in NAD HEENT: moist mucous membranes CV: Regular rate and rhythm, no murmurs rubs or gallops PULM: On RA - clear to auscultation bilaterally  ABD: (+) tender to palpation EXT: No edema  Neuro: Alert and oriented x4  PPS: 60%   This conversation/these recommendations were discussed with patient primary care team.  Time In: 0630 Time Out: 0740 Total Time: 70 Greater than 50%  of this time was spent counseling and coordinating care related to the above assessment and plan.  [place] Team Team Cell Phone: [phone] Please utilize secure chat with additional questions, if there is no response within 30 minutes please call the above phone number  Palliative Medicine Team providers are available by phone from 7am to 7pm  daily and can be reached through the team cell phone.  Should this patient require assistance outside of these hours, please call the patient's attending physician.

## 2020-10-31 NOTE — Progress Notes (Signed)
Subjective:  Patient denies any chest pain or shortness of breath remains in A. fib with controlled ventricular response occasional PVCs noted on the monitor.  Back pain has improved.  Objective:  Vital Signs in the last 24 hours: Temp:  [97.7 F (36.5 C)-98.6 F (37 C)] 97.7 F (36.5 C) (11/06 0749) Pulse Rate:  [63-93] 65 (11/06 0749) Resp:  [13-14] 13 (11/06 0749) BP: (123-131)/(68-91) 126/69 (11/06 1025) SpO2:  [100 %] 100 % (11/06 0749)  Intake/Output from previous day: 11/05 0701 - 11/06 0700 In: 580 [P.O.:580] Out: 400 [Urine:400] Intake/Output from this shift: No intake/output data recorded.  Physical Exam: Neck: no adenopathy, no carotid bruit, no JVD and supple, symmetrical, trachea midline Lungs: Clear to auscultation anterolaterally Heart: irregularly irregular rhythm, S1, S2 normal and 2/6 systolic murmur noted Abdomen: soft, non-tender; bowel sounds normal; no masses,  no organomegaly Extremities: extremities normal, atraumatic, no cyanosis or edema  Lab Results: Recent Labs    10/30/20 1956 10/31/20 0340  WBC 10.6* 9.7  HGB 9.1* 9.4*  PLT 206 180   Recent Labs    10/30/20 0200 10/31/20 0340  NA 135 134*  K 4.6 4.9  CL 102 102  CO2 20* 18*  GLUCOSE 118* 99  BUN 65* 84*  CREATININE 4.81* 5.60*   No results for input(s): TROPONINI in the last 72 hours.  Invalid input(s): CK, MB Hepatic Function Panel No results for input(s): PROT, ALBUMIN, AST, ALT, ALKPHOS, BILITOT, BILIDIR, IBILI in the last 72 hours. No results for input(s): CHOL in the last 72 hours. No results for input(s): PROTIME in the last 72 hours.  Imaging: Imaging results have been reviewed and US RENAL  Result Date: 10/29/2020 CLINICAL DATA:  Acute kidney injury EXAM: RENAL / URINARY TRACT ULTRASOUND COMPLETE COMPARISON:  CT 10/26/2020 FINDINGS: Right Kidney: Renal measurements: 10.6 x 5.7 x 5.0 cm = volume: 158 mL. Multiple cysts measuring up to 2.7 cm. Diffusely increased  echotexture. No hydronephrosis. Right adrenal lesion is again noted as seen on prior CT. Left Kidney: Renal measurements: 10.9 x 6.9 x 4.5 cm = volume: 177 mL. Midpole cyst measures 1.9 cm. Mildly increased echotexture. No hydronephrosis. Large left adrenal mass superior to the left kidney measures up to 15 cm. Bladder: Decompressed. Other: None. IMPRESSION: Bilateral adrenal masses as seen on prior CT. Increased echotexture within the kidneys compatible with chronic medical renal disease. No hydronephrosis. Electronically Signed   By: Rolm Baptise M.D.   On: 10/29/2020 20:14   DG CHEST PORT 1 VIEW  Result Date: 10/30/2020 CLINICAL DATA:  64 year old female with shortness of breath. EXAM: PORTABLE CHEST 1 VIEW COMPARISON:  Portable chest 10/26/2020 and earlier. FINDINGS: Portable AP semi upright view at 1223 hours. Stable cardiomegaly and mediastinal contours. Lung volumes are stable and within normal limits. Mildly rotated to the right now. Allowing for portable technique the lungs are clear. No pneumothorax. Stable thoracic inlet and right subclavian region surgical clips. Negative visible bowel gas. No acute osseous abnormality identified. IMPRESSION: Stable cardiomegaly. No acute cardiopulmonary abnormality. Electronically Signed   By: Genevie Ann M.D.   On: 10/30/2020 13:52    Cardiac Studies:  Assessment/Plan:  chronic atrial fibrillation, rate controlled.  Not the candidate for chronic anticoagulation in view of recurrent bleed Coronary artery disease history of non-Q wave MI in the past status post CABG in the past Aortic stenosis status post AVR with bioprosthetic valve Valvular heart disease History of CVA Hypertension Hyperlipidemia. Left adrenal mass Pelvic mass Retroperitoneal bleed Anemia  of blood loss Anemia of chronic disease Acute kidney injury on CKD, IV Tobacco use disorder Anxiety Plan Continue present management Monitor renal function Out of bed to chair as  tolerated Increase activity as tolerated with assistance  LOS: 5 days    Charolette Forward 10/31/2020, 10:52 AM

## 2020-10-31 NOTE — Progress Notes (Signed)
Patient expressing she needs to have a bowel movement, 'its right there but won't come out'. Patient offered a bedpan or to get up to the bedside commode and refusing at this time. This RN explained that a suppository as ordered would be helpful to help pass the stool but she continues to express that she does not want it at this time. Patient with hyperactive bowel sounds. Will continue to encourage patient to move or to use suppository to offer relief.

## 2020-10-31 NOTE — Progress Notes (Signed)
PT Cancellation Note  Patient Details Name: Renee Griffin MRN: [id number] DOB: [birth year]   Cancelled Treatment:    Reason Eval/Treat Not Completed: Patient declined, no reason specified attempted to work with patient, she adamantly refuses working with therapy and states her back hurts and she doesn't want to move right now. She was in a very odd position in bed but refused PT offers to help at least reposition. Eventually ended up just shaking her head no and stating "no thank you" to anything PT said. Will attempt to try back on next date of service.    Windell Norfolk, DPT, PN1   Supplemental Physical Therapist [facility]    Pager [phone] Acute Rehab Office [phone]

## 2020-10-31 NOTE — Evaluation (Signed)
Clinical/Bedside Swallow Evaluation Patient Details  Name: Renee Griffin MRN: [id number] Date of Birth: 1955/12/29  Today's Date: 10/31/2020 Time: SLP Start Time (ACUTE ONLY): 1213 SLP Stop Time (ACUTE ONLY): 1221 SLP Time Calculation (min) (ACUTE ONLY): 8 min  Past Medical History:  Past Medical History:  Diagnosis Date  . Depression   . Diabetes mellitus without complication ([place])   . Heart disease, congenital   . High cholesterol   . Hypertension   . Panic attack   . Stroke [facility])    Past Surgical History:  Past Surgical History:  Procedure Laterality Date  . CARDIAC SURGERY  2011   CABG  . COLONOSCOPY WITH PROPOFOL N/A 02/08/2018   Procedure: COLONOSCOPY WITH PROPOFOL;  Surgeon: Otis Brace, MD;  Location: [facility] ENDOSCOPY;  Service: Gastroenterology;  Laterality: N/A;  . COLONOSCOPY WITH PROPOFOL N/A 12/16/2018   Procedure: COLONOSCOPY WITH PROPOFOL;  Surgeon: Ronald Lobo, MD;  Location: [facility] ENDOSCOPY;  Service: Endoscopy;  Laterality: N/A;  . DILATION AND EVACUATION     Patient states she had an abortion 30 or 40 years ago  . ESOPHAGOGASTRODUODENOSCOPY (EGD) WITH PROPOFOL Left 02/05/2018   Procedure: ESOPHAGOGASTRODUODENOSCOPY (EGD) WITH PROPOFOL;  Surgeon: Otis Brace, MD;  Location: [facility] ENDOSCOPY;  Service: Gastroenterology;  Laterality: Left;  . IR ANGIOGRAM SELECTIVE EACH ADDITIONAL VESSEL  12/18/2018  . IR ANGIOGRAM SELECTIVE EACH ADDITIONAL VESSEL  12/18/2018  . IR ANGIOGRAM SELECTIVE EACH ADDITIONAL VESSEL  12/18/2018  . IR ANGIOGRAM SELECTIVE EACH ADDITIONAL VESSEL  12/18/2018  . IR ANGIOGRAM VISCERAL SELECTIVE  12/18/2018  . IR EMBO ART  VEN HEMORR LYMPH EXTRAV  INC GUIDE ROADMAPPING  12/18/2018  . IR US GUIDE VASC ACCESS RIGHT  12/18/2018  . LEFT HEART CATH AND CORONARY ANGIOGRAPHY N/A 02/10/2020   Procedure: LEFT HEART CATH AND CORONARY ANGIOGRAPHY;  Surgeon: Dixie Dials, MD;  Location: [place] CV LAB;  Service: Cardiovascular;  Laterality: N/A;   . WISDOM TOOTH EXTRACTION     HPI:  Renee Griffin is a 64 year old female with past medical history of chronic systolic and diastolic heart failure, chronic atrial fibrillation (on Eliquis,) CAD s/p CABG, aortic stenosis s/p bioprosthetic aortic valve, prior CVA of left cerebellum and pons, HTN, HLD, COPD, CKD IV, anxiety, and current tobacco use disorder who presented to [facility] on 10/26/2020 for evaluation of left flank pain found to have enlargement of known left adrenal mass with hemorrhage into neoplasm and retroperitoneum. CXR 11/5 with no acute findings.   Assessment / Plan / Recommendation Clinical Impression  Pt presents with functional swallowing as assessed clinically.  Pt tolerated all consistencies trialed with no clinical s/s of aspiration and exhibted good oral clearance of solids.  CXR remains clear (11/1 and 11/5).   Recommend continuing regular texture diet with thin liquids.  Pt has not ST needs at this time; SLP will sign off.  SLP Visit Diagnosis: Dysphagia, unspecified (R13.10)    Aspiration Risk  No limitations    Diet Recommendation Regular;Thin liquid   Liquid Administration via: Cup;Straw Medication Administration: Whole meds with liquid Supervision: Patient able to self feed Compensations: Slow rate;Small sips/bites Postural Changes: Seated upright at 90 degrees    Other  Recommendations Oral Care Recommendations: Oral care BID   Follow up Recommendations None      Frequency and Duration  (N/A)          Prognosis Prognosis for Safe Diet Advancement:  (N/A)      Swallow Study   General HPI: [name].  Griffin is a 64 year old female with past medical history of chronic systolic and diastolic heart failure, chronic atrial fibrillation (on Eliquis,) CAD s/p CABG, aortic stenosis s/p bioprosthetic aortic valve, prior CVA of left cerebellum and pons, HTN, HLD, COPD, CKD IV, anxiety, and current tobacco use disorder who presented to [facility] on 10/26/2020  for evaluation of left flank pain found to have enlargement of known left adrenal mass with hemorrhage into neoplasm and retroperitoneum. CXR 11/5 with no acute findings. Type of Study: Bedside Swallow Evaluation Previous Swallow Assessment: None Diet Prior to this Study: Regular;Thin liquids Temperature Spikes Noted: No Respiratory Status: Room air History of Recent Intubation: No Behavior/Cognition: Alert;Cooperative;Pleasant mood Oral Cavity Assessment: Within Functional Limits Oral Care Completed by SLP: No Oral Cavity - Dentition: Adequate natural dentition;Missing dentition Vision: Functional for self-feeding Self-Feeding Abilities: Able to feed self Patient Positioning: Upright in bed Baseline Vocal Quality: Normal Volitional Cough: Weak Volitional Swallow: Able to elicit    Oral/Motor/Sensory Function Overall Oral Motor/Sensory Function: Mild impairment Facial ROM: Within Functional Limits Facial Symmetry: Within Functional Limits Lingual ROM: Within Functional Limits Lingual Symmetry: Within Functional Limits Lingual Strength: Reduced Velum: Within Functional Limits Mandible: Within Functional Limits   Ice Chips Ice chips: Not tested   Thin Liquid Thin Liquid: Within functional limits Presentation: Straw    Nectar Thick Nectar Thick Liquid: Not tested   Honey Thick Honey Thick Liquid: Not tested   Puree Puree: Within functional limits Presentation: Spoon   Solid     Solid: Within functional limits Presentation: [place], [place], [place] Office: [phone]; Pager [phone]): [phone] 10/31/2020,12:59 PM

## 2020-10-31 NOTE — Progress Notes (Deleted)
Patient given discharge AVS. Activity restrictions post pacemaker and post heart catheterization given with patient teach back. Follow up appointments discussed and medications changes emphasized.  IVs removed and ride called.

## 2020-10-31 NOTE — Progress Notes (Signed)
Subjective:  Renee Griffin. Dioguardi is a 64 year old female with past medical history of chronic systolic and diastolic heart failure, chronic atrial fibrillation (on Eliquis,) CAD s/p CABG, aortic stenosis s/p bioprosthetic aortic valve, prior CVA of left cerebellum and pons, HTN, HLD, COPD, CKD IV, anxiety, and current tobacco use disorder who presented to [facility] on 10/26/2020 for evaluation of left flank pain found to have enlargement of known left adrenal mass with hemorrhage into neoplasm and retroperitoneum.  Overnight, patient received 5mg  oxycodone for pain.  This morning, patient reports continued soreness of her left flank. Otherwise, she denies any associated complaints. She states that she continues to urinate without difficulty and denies pain with urination. She understands her current condition and desires surgical intervention. She understands that her kidneys are significantly injured from her condition. She has no further questions or concerns.  Objective:  Vital signs in last 24 hours: Vitals:   10/30/20 2000 10/30/20 2331 10/31/20 0400 10/31/20 0749  BP: 129/71  124/71 (!) 123/91  Pulse: 67  63 65  Resp: 13  14 13   Temp: 98.5 F (36.9 C) 98.3 F (36.8 C) 98.2 F (36.8 C) 97.7 F (36.5 C)  TempSrc: Oral Oral Oral Oral  SpO2: 100%  100% 100%  Weight:      Height:      SpO2: 100 % O2 Flow Rate (L/min): 2.5 L/min   Intake/Output Summary (Last 24 hours) at 10/31/2020 0942 Last data filed at 10/31/2020 0400 Gross per 24 hour  Intake 580 ml  Output 400 ml  Net 180 ml   Filed Weights   10/28/20 0040 10/29/20 0308 10/30/20 0400  Weight: 70.9 kg 74 kg 78.6 kg   Physical Exam Vitals and nursing note reviewed.  Constitutional:      General: She is not in acute distress.    Appearance: Normal appearance. She is not ill-appearing.  HENT:     Head: Normocephalic and atraumatic.  Eyes:     Extraocular Movements: Extraocular movements intact.     Conjunctiva/sclera:  Conjunctivae normal.  Cardiovascular:     Rate and Rhythm: Normal rate. Rhythm irregular.  Pulmonary:     Effort: Pulmonary effort is normal. No respiratory distress.     Breath sounds: Normal breath sounds.  Abdominal:     General: Bowel sounds are normal. There is no distension.     Tenderness: There is no abdominal tenderness.  Musculoskeletal:        General: Normal range of motion.     Cervical back: Normal range of motion and neck supple.     Right lower leg: No edema.     Left lower leg: No edema.  Skin:    General: Skin is warm and dry.     Capillary Refill: Capillary refill takes less than 2 seconds.     Coloration: Skin is not pale.  Neurological:     General: No focal deficit present.     Mental Status: She is alert. Mental status is at baseline.  Psychiatric:     Comments: Mildly anxious mood    CBC Latest Ref Rng & Units 10/31/2020 10/30/2020 10/30/2020  WBC 4.0 - 10.5 K/uL 9.7 10.6(H) 11.5(H)  Hemoglobin 12.0 - 15.0 g/dL 9.4(L) 9.1(L) 9.6(L)  Hematocrit 36 - 46 % 30.1(L) 29.4(L) 31.0(L)  Platelets 150 - 400 K/uL 180 206 192   CMP Latest Ref Rng & Units 10/31/2020 10/30/2020 10/29/2020  Glucose 70 - 99 mg/dL 99 118(H) 115(H)  BUN 8 - 23 mg/dL  84(H) 65(H) 51(H)  Creatinine 0.44 - 1.00 mg/dL 5.60(H) 4.81(H) 3.90(H)  Sodium 135 - 145 mmol/L 134(L) 135 136  Potassium 3.5 - 5.1 mmol/L 4.9 4.6 4.5  Chloride 98 - 111 mmol/L 102 102 103  CO2 22 - 32 mmol/L 18(L) 20(L) 20(L)  Calcium 8.9 - 10.3 mg/dL 8.7(L) 8.8(L) 9.1  Total Protein 6.5 - 8.1 g/dL - - -  Total Bilirubin 0.3 - 1.2 mg/dL - - -  Alkaline Phos 38 - 126 U/L - - -  AST 15 - 41 U/L - - -  ALT 0 - 44 U/L - - -   Cortisol - 18.4 Urine sodium - 74 Creatinine, Urine - 358.14 Total protein, urine - 302 Protein / creatinine ratio - 0.84 UA - Large leukocytes, >50 WBC, many bacteria Metanephrines, plasma - in process Urine culture - in process  IMAGING: US RENAL  Result Date: 10/29/2020 CLINICAL DATA:  Acute  kidney injury EXAM: RENAL / URINARY TRACT ULTRASOUND COMPLETE COMPARISON:  CT 10/26/2020 FINDINGS: Right Kidney: Renal measurements: 10.6 x 5.7 x 5.0 cm = volume: 158 mL. Multiple cysts measuring up to 2.7 cm. Diffusely increased echotexture. No hydronephrosis. Right adrenal lesion is again noted as seen on prior CT. Left Kidney: Renal measurements: 10.9 x 6.9 x 4.5 cm = volume: 177 mL. Midpole cyst measures 1.9 cm. Mildly increased echotexture. No hydronephrosis. Large left adrenal mass superior to the left kidney measures up to 15 cm. Bladder: Decompressed. Other: None. IMPRESSION: Bilateral adrenal masses as seen on prior CT. Increased echotexture within the kidneys compatible with chronic medical renal disease. No hydronephrosis. Electronically Signed   By: Rolm Baptise M.D.   On: 10/29/2020 20:14   DG CHEST PORT 1 VIEW  Result Date: 10/30/2020 CLINICAL DATA:  64 year old female with shortness of breath. EXAM: PORTABLE CHEST 1 VIEW COMPARISON:  Portable chest 10/26/2020 and earlier. FINDINGS: Portable AP semi upright view at 1223 hours. Stable cardiomegaly and mediastinal contours. Lung volumes are stable and within normal limits. Mildly rotated to the right now. Allowing for portable technique the lungs are clear. No pneumothorax. Stable thoracic inlet and right subclavian region surgical clips. Negative visible bowel gas. No acute osseous abnormality identified. IMPRESSION: Stable cardiomegaly. No acute cardiopulmonary abnormality. Electronically Signed   By: Genevie Ann M.D.   On: 10/30/2020 13:52   Assessment/Plan:  Principal Problem:   Retroperitoneal bleed Active Problems:   HTN (hypertension)   Dyslipidemia   Anxiety   Ovarian mass, left   Atrial fibrillation with RVR (HCC)   Adrenal mass greater than 4 cm in diameter (HCC)   Acute blood loss anemia   CKD (chronic kidney disease) stage 4, GFR 15-29 ml/min (HCC)   Palliative care by specialist   Goals of care, counseling/discussion   DNR  (do not resuscitate)  Renee Griffin is a 64 year old female with past medical history of chronic systolic and diastolic heart failure, chronic atrial fibrillation (on Eliquis,) CAD s/p CABG, aortic stenosis s/p bioprosthetic aortic valve, prior CVA of left cerebellum and pons, HTN, HLD, COPD, CKD IV, anxiety, and current tobacco use disorder who presented to [facility] on 10/26/2020 for evaluation of left flank pain found to have enlargement of known left adrenal mass with hemorrhage into neoplasm and retroperitoneum.  #Hemorrhagic left adrenal mass, active Patient with known large left-sided adrenal mass noted on imaging in February of 2019 presenting with sudden onset of left-flank pain. Imaging on arrival reveals enlargement of the mass with hemorrhage into neoplasm itself as  well as the retroperitoneum. Patient's mass has not been biopsied since initial finding in 2019, however pheochromocytoma and adrenal cortical carcinoma remain high on the differential. She received KCentra in the ED and her home anticoagulation is being held since admission.  11/2: Patient having gradually down trending hemoglobin, most recently to 7.4. Discussed case with IR who recommended to continue with medical management as patient has various risk factors (most notably her poor renal function) which limit the ability to undergo arteriogram and embolization. Discussed case with general surgery who recommend consulting urology as they had originally seen patient in 2019. 11/3: Patient continues to have gradually down trending hemoglobin following 1u PRBCs yesterday. Additionally, patient's blood pressure gradually down trending to 11B systolic. Discussed case with Dr. Tresa Moore with Urology. Patient's hypotension likely multifactorial including initiation of alpha-1 antagonist, ongoing bleed, continuation of diltiazem drip and metoprolol. Despite this, prazosin should be continued given possibility of emergent adrenalectomy. 11/4:  Patient's hemoglobin stable at 9.5 most recently. Blood pressure still mildly soft while taking alpha antagonist, however this is an expected response. At this point, patient's bleed may be resolving, however we will need to continue to monitor her hemoglobin and blood pressure closely. 11/5: Hemoglobin stable 8.9 down from 9.4. Pain much improved. Blood pressure mildly depressed overnight following reinitiating metoprolol. 11/6: Hemoglobin stable at 9.4. Pain continues to improve. Normotensive with normal heart rate. Palliative care consulted for discussion of goals of care. -CBC daily, monitor Hb closely -Transfuse Hb <8 -Continue holding home Eliquis -Dr. Doylene Canard with cardiology following -Nephrology following -Dr. Tresa Moore with urology following  -Recommending left adrenalectomy in 6-12 weeks  -Follow-up plasma metanephrines (pending)  -Prazosin 2mg  PO Q8H -Oxycodone 5mg  tablet Q6H PRN for moderate to severe pain -Oncology consult  #Acute tubular necrosis, active #CKD4, chronic Patient with baseline creatinine of approximately 1.9 with progressively worsening renal function, creatinine 5.60 most recently, prior 4.81, 3.90, 2.78. Likely secondary to hemorrhagic shock resulting in acute tubular necrosis. Patient has continued to have urine output with 400cc's in past twenty four hours. Nephrology consulted on 10/29/2020, greatly appreciate further recommendations. -Nephrology consulted, appreciate recommendations  -Hold antihypertensives in setting of recurrent episodes of hypotension  -Avoid nephrotoxic agents  -As of 10/30/2020, no need for dialysis -Daily BMP -Renal diet  #Urinary tract infection, active Patient's UA revealing negative nitrites, large leukocytes, >50 WBC, many bacteria, WBC clumps. On day two of ceftriaxone. -Continue ceftriaxone for five days  #Atrial fibrillation with RVR, chronic Patient's home medication regimen for this condition includes metoprolol 12.5mg  twice  daily, diltiazem 120mg  daily, and Eliquis 2.5mg  twice daily. In the setting of her hemorrhagic left adrenal mass, we will continue to hold her home Eliquis and diltiazem. Following starting prazosin and holding home medications, patient's heart rate well controlled at this time.  -Holding home metoprolol in the setting of repeated episodes of hypotension upon restarting and currently rate controlled  -Holding home diltiazem and currently rate controlled  -Holding home Eliquis in setting of recent bleed  #Large pelvic cyst, active #Cystic areas in bilateral kidneys, active Patient noted to have 10 x 7cm cystic lesion in the pelvis on CT scan concerning low-grade cystic neoplasm as well as multiple cystic areas in bilateral kidneys. Similar findings noted in initial CT Abdomen/Pelvis in February of 2019. Although further workup of these lesions is warranted, patient's current acute bleed is the primary focus at this time. -Further workup pending stabilization of acute bleed  -GYN consult  -MRI pelvis with and without contrast if  clinically warranted  -Consider follow-up renal sonogram or multiphase CT when the patient is able  #Hypertension, chronic Patient normotensive with continued prazosin use. -Prazosin 2mg  three times daily -Holding home metoprolol -Holding home diltiazem  #HFpEF, chronic Patient echo reveals EF of 50-55%. Patient does not appear volume overloaded on examination. -Discontinued home torsemide 20mg  Monday, Wednesday and Friday -Continue digoxin 0.125mg  Monday, Wednesday and Friday -2.5L O2 via nasal cannula currently, wean as patient tolerated (more for comfort at this point) -Dr. Doylene Canard with cardiology is following  #HLD, chronic -Continue home gemfibrozil  #Anxiety, chronic -Continue home sertraline -Continue Alprazolam 0.25mg  Q8H  #Tobacco Use Disorder, chronic -Nicotine patch  Code status: Full code VTE ppx: SCDs Bowel regimen: Senna twice daily,    Diet: Renal with fluid restriction PT/OT recs: PT eval and treat 10/31/2020  Cato Mulligan, MD 10/31/2020, 9:42 AM Pager: [phone] After 5pm on weekdays and 1pm on weekends: On Call pager [phone]

## 2020-10-31 NOTE — Plan of Care (Signed)
°  Problem: Clinical Measurements: °Goal: Will remain free from infection °Outcome: Progressing °Goal: Respiratory complications will improve °Outcome: Progressing °  °

## 2020-11-01 DIAGNOSIS — R578 Other shock: Secondary | ICD-10-CM | POA: Diagnosis present

## 2020-11-01 DIAGNOSIS — R58 Hemorrhage, not elsewhere classified: Secondary | ICD-10-CM | POA: Diagnosis not present

## 2020-11-01 LAB — BASIC METABOLIC PANEL
Anion gap: 11 (ref 5–15)
BUN: 90 mg/dL — ABNORMAL HIGH (ref 8–23)
CO2: 20 mmol/L — ABNORMAL LOW (ref 22–32)
Calcium: 9 mg/dL (ref 8.9–10.3)
Chloride: 105 mmol/L (ref 98–111)
Creatinine, Ser: 5.59 mg/dL — ABNORMAL HIGH (ref 0.44–1.00)
GFR, Estimated: 8 mL/min — ABNORMAL LOW (ref >60)
Glucose, Bld: 103 mg/dL — ABNORMAL HIGH (ref 70–99)
Potassium: 4.6 mmol/L (ref 3.5–5.1)
Sodium: 136 mmol/L (ref 135–145)

## 2020-11-01 LAB — CBC
HCT: 30.3 % — ABNORMAL LOW (ref 36.0–46.0)
Hemoglobin: 9.6 g/dL — ABNORMAL LOW (ref 12.0–15.0)
MCH: 31 pg (ref 26.0–34.0)
MCHC: 31.7 g/dL (ref 30.0–36.0)
MCV: 97.7 fL (ref 80.0–100.0)
Platelets: 213 10*3/uL (ref 150–400)
RBC: 3.1 MIL/uL — ABNORMAL LOW (ref 3.87–5.11)
RDW: 16.8 % — ABNORMAL HIGH (ref 11.5–15.5)
WBC: 8.4 10*3/uL (ref 4.0–10.5)
nRBC: 0 % (ref 0.0–0.2)

## 2020-11-01 MED ORDER — METOPROLOL TARTRATE 5 MG/5ML IV SOLN
5.0000 mg | Freq: Once | INTRAVENOUS | Status: AC
  Administered 2020-11-01: 5 mg via INTRAVENOUS
  Filled 2020-11-01: qty 5

## 2020-11-01 MED ORDER — METOPROLOL TARTRATE 25 MG PO TABS
25.0000 mg | ORAL_TABLET | Freq: Two times a day (BID) | ORAL | Status: DC
  Administered 2020-11-01 – 2020-11-06 (×11): 25 mg via ORAL
  Filled 2020-11-01 (×11): qty 1

## 2020-11-01 MED ORDER — ACETAMINOPHEN 325 MG PO TABS
650.0000 mg | ORAL_TABLET | Freq: Four times a day (QID) | ORAL | Status: DC | PRN
  Administered 2020-11-01: 650 mg via ORAL
  Filled 2020-11-01: qty 2

## 2020-11-01 NOTE — Progress Notes (Signed)
Subjective:  Patient denies any chest pain or shortness of breath noted to be in A. fib with RVR.  Denies palpitations.  Just had BM.  Objective:  Vital Signs in the last 24 hours: Temp:  [97.9 F (36.6 C)-98.4 F (36.9 C)] 98 F (36.7 C) (11/07 1200) Pulse Rate:  [86-120] 110 (11/07 1200) Resp:  [14-16] 16 (11/07 1100) BP: (124-152)/(70-114) 141/105 (11/07 1200) SpO2:  [90 %-99 %] 92 % (11/07 1200) Weight:  [75 kg] 75 kg (11/07 0345)  Intake/Output from previous day: 11/06 0701 - 11/07 0700 In: 1160 [P.O.:1060; IV Piggyback:100] Out: 300 [Urine:300] Intake/Output from this shift: Total I/O In: 240 [P.O.:240] Out: 500 [Urine:500]  Physical Exam: Exam unchanged except for A. fib with RVR  Lab Results: Recent Labs    10/31/20 0340 11/01/20 0313  WBC 9.7 8.4  HGB 9.4* 9.6*  PLT 180 213   Recent Labs    10/31/20 0340 11/01/20 0313  NA 134* 136  K 4.9 4.6  CL 102 105  CO2 18* 20*  GLUCOSE 99 103*  BUN 84* 90*  CREATININE 5.60* 5.59*   No results for input(s): TROPONINI in the last 72 hours.  Invalid input(s): CK, MB Hepatic Function Panel No results for input(s): PROT, ALBUMIN, AST, ALT, ALKPHOS, BILITOT, BILIDIR, IBILI in the last 72 hours. No results for input(s): CHOL in the last 72 hours. No results for input(s): PROTIME in the last 72 hours.  Imaging: Imaging results have been reviewed and DG CHEST PORT 1 VIEW  Result Date: 10/30/2020 CLINICAL DATA:  64 year old female with shortness of breath. EXAM: PORTABLE CHEST 1 VIEW COMPARISON:  Portable chest 10/26/2020 and earlier. FINDINGS: Portable AP semi upright view at 1223 hours. Stable cardiomegaly and mediastinal contours. Lung volumes are stable and within normal limits. Mildly rotated to the right now. Allowing for portable technique the lungs are clear. No pneumothorax. Stable thoracic inlet and right subclavian region surgical clips. Negative visible bowel gas. No acute osseous abnormality identified.  IMPRESSION: Stable cardiomegaly. No acute cardiopulmonary abnormality. Electronically Signed   By: Genevie Ann M.D.   On: 10/30/2020 13:52    Cardiac Studies:  Assessment/Plan:  chronic atrial fibrillation with RVR  Not the candidate for chronic anticoagulation in view of recurrent bleed Coronary artery disease history of non-Q wave MI in the past status post CABG in the past Aortic stenosis status post AVR with bioprosthetic valve Valvular heart disease History of CVA Hypertension Hyperlipidemia. Left adrenal mass Pelvic mass Retroperitoneal bleed Anemia of blood loss Anemia of chronic disease Acute kidney injury on CKD, IV Tobacco use disorder Plan Start low-dose of beta-blockers as per orders Dr. Doylene Canard to follow from a.m.  LOS: 6 days    Renee Griffin 11/01/2020, 12:06 PM

## 2020-11-01 NOTE — Social Work (Signed)
CSW was messaged by MD in regards to a placment and CSW noted that patient has refused all therapy sessions.   CSW met with patient in order to discuss a safe discharge plan. Patient informed CSW that she was getting an operation and should be in the hospital for months. CSW inquired why patient is refusing therapy sessions and she explained that she is not sure that she will need therapy. CSW explained that a therapy evaluation would need to be completed for a recommendation.   CSW messaged MD back to alert her of discussion with patient.  TOC team will continue to assist with discharge planning needs.

## 2020-11-01 NOTE — Progress Notes (Signed)
   11/01/20 1200  Assess: MEWS Score  Temp 98 F (36.7 C)  BP (!) 141/105  Pulse Rate (!) 110  ECG Heart Rate (!) 130  Level of Consciousness Alert  SpO2 92 %  O2 Device Room Air  Assess: MEWS Score  MEWS Temp 0  MEWS Systolic 0  MEWS Pulse 3  MEWS RR 0  MEWS LOC 0  MEWS Score 3  MEWS Score Color Yellow  Assess: if the MEWS score is Yellow or Red  Were vital signs taken at a resting state? Yes  Focused Assessment No change from prior assessment  Early Detection of Sepsis Score *See Row Information* Low  MEWS guidelines implemented *See Row Information* Yes  Treat  MEWS Interventions Administered scheduled meds/treatments  Pain Scale 0-10  Pain Score 6  Pain Type Chronic pain  Pain Location Back  Take Vital Signs  Increase Vital Sign Frequency  Yellow: Q 2hr X 2 then Q 4hr X 2, if remains yellow, continue Q 4hrs  Escalate  MEWS: Escalate Yellow: discuss with charge nurse/RN and consider discussing with provider and RRT  Notify: Charge Nurse/RN  Name of Charge Nurse/RN Notified Jasmina   Date Charge Nurse/RN Notified 11/01/20  Time Charge Nurse/RN Notified 1203  Notify: Provider  Provider Name/Title Dr. Darrick Meigs  Date Provider Notified 11/01/20  Time Provider Notified 1203  Notification Type Page  Notification Reason Change in status  Response See new orders  Date of Provider Response 11/01/20  Time of Provider Response 1203  Document  Patient Outcome Other (Comment) (will administer new medications and reassess)  Progress note created (see row info) Yes    Patient expressing need to have a BM. Suppository given but HR still very elevated 120-150s. Internal medicine made aware and dose of beta blocker to be ordered to give to patient. Will give and continue to monitor

## 2020-11-01 NOTE — Progress Notes (Signed)
  Date: 11/01/2020  Patient name: Renee Griffin  Medical record number: [id number]  Date of birth: [birth year]   This patient's plan of care was discussed with the house staff. Please see Dr. Chase Picket note for complete details. I concur with her findings.   Sid Falcon, MD 11/01/2020, 3:48 PM

## 2020-11-01 NOTE — Progress Notes (Signed)
Subjective/Chief Complaint:  1 - Large Left Adrenal Neoplasm With Recurrent Bleed / Suspect Pheochromocytoma or Adrenocortical Carcinoma - 15cm left adrenal neoplasm with mostly internal hemorrhage by ER CT 11/1. Similar episode 2019 managed conservatively. Endocrine eval 2019 with elevated metanephrines at 4X ULN and known refractory hypertension. Failed to follow up for management in 2019. Prior imaging with large aortic branch supply to mass.   Recent Course This Admission: 11/2 - 1u pRBC, started alpha blockade with prazosin  11/3 1u pRBC Hgb 7.9 --> 8.9 11/4 Hgb 9.6 11/5 Hgb 9.1 11/6 Hgb 9.4 11/7 Hgb 9.6  2 - Acute on Chronic Stage 4 Renal Insufficiency - Cr 2s with GFR 25-30. No hydro on imaging x several, Known severe HTN x years. Cr worsening to 5.59. No sig hydro on imaging this admission.    Today she still reports significant pain but it is helped intermittently with pain medication. Hgb has stabilized.   Objective: Vital signs in last 24 hours: Temp:  [97.7 F (36.5 C)-98.4 F (36.9 C)] 98.4 F (36.9 C) (11/07 0731) Pulse Rate:  [68-110] 86 (11/07 0600) Resp:  [13-16] 14 (11/07 0600) BP: (122-152)/(69-114) 128/96 (11/07 0600) SpO2:  [90 %-100 %] 99 % (11/07 0600) Weight:  [75 kg] 75 kg (11/07 0345) Last BM Date: 10/30/20  Intake/Output from previous day: 11/06 0701 - 11/07 0700 In: 1160 [P.O.:1060; IV Piggyback:100] Out: 300 [Urine:300] Intake/Output this shift: Total I/O In: 240 [P.O.:240] Out: 500 [Urine:500]  EXAM: Alert and oriented, sitting up in bed and conversational Normal work of breathing   LUQ tender to palpation Stable truncal obesity No c/c/e  Lab Results:  Recent Labs    10/31/20 0340 11/01/20 0313  WBC 9.7 8.4  HGB 9.4* 9.6*  HCT 30.1* 30.3*  PLT 180 213   BMET Recent Labs    10/31/20 0340 11/01/20 0313  NA 134* 136  K 4.9 4.6  CL 102 105  CO2 18* 20*  GLUCOSE 99 103*  BUN 84* 90*  CREATININE 5.60* 5.59*  CALCIUM  8.7* 9.0   PT/INR No results for input(s): LABPROT, INR in the last 72 hours. ABG No results for input(s): PHART, HCO3 in the last 72 hours.  Invalid input(s): PCO2, PO2  Studies/Results: DG CHEST PORT 1 VIEW  Result Date: 10/30/2020 CLINICAL DATA:  64 year old female with shortness of breath. EXAM: PORTABLE CHEST 1 VIEW COMPARISON:  Portable chest 10/26/2020 and earlier. FINDINGS: Portable AP semi upright view at 1223 hours. Stable cardiomegaly and mediastinal contours. Lung volumes are stable and within normal limits. Mildly rotated to the right now. Allowing for portable technique the lungs are clear. No pneumothorax. Stable thoracic inlet and right subclavian region surgical clips. Negative visible bowel gas. No acute osseous abnormality identified. IMPRESSION: Stable cardiomegaly. No acute cardiopulmonary abnormality. Electronically Signed   By: Genevie Ann M.D.   On: 10/30/2020 13:52    Anti-infectives: Anti-infectives (From admission, onward)   Start     Dose/Rate Route Frequency Ordered Stop   10/31/20 0800  cefTRIAXone (ROCEPHIN) 1 g in sodium chloride 0.9 % 100 mL IVPB        1 g 200 mL/hr over 30 Minutes Intravenous Every 24 hours 10/30/20 1912 11/04/20 0759   10/30/20 0800  cefTRIAXone (ROCEPHIN) 1 g in sodium chloride 0.9 % 100 mL IVPB  Status:  Discontinued        1 g 200 mL/hr over 30 Minutes Intravenous Every 24 hours 10/30/20 0715 10/30/20 1912      Assessment/Plan:  1 - Large Left Adrenal Neoplasm With Recurrent Bleed / Suspect Pheochromocytoma or Adrenocortical Carcinoma  - rec BP control especially with alpha blockers as most effective given endocrine active adrenal neoplasm - hgb is stable at this time, however PRN transfusion as needed. If unstable or continued transfusions are needed, recommend IR embolization - plan for elective left adrenalectomy in 6-12 weeks. -appreciate medical / cardiology team comanagement.   2 - Stage 4 Renal Insufficiency - Medical  renal disease likely form HTN (made worse from above), likely chronic intravascular volume depletion, and DM2. - rec close follow up for these matters   Taber Sweetser [facility] 11/01/2020

## 2020-11-01 NOTE — Plan of Care (Signed)
  Problem: Clinical Measurements: Goal: Respiratory complications will improve Outcome: Progressing   Problem: Activity: Goal: Risk for activity intolerance will decrease Outcome: Progressing   Problem: Coping: Goal: Level of anxiety will decrease Outcome: Progressing   

## 2020-11-01 NOTE — Plan of Care (Signed)
  Problem: Elimination: Goal: Will not experience complications related to bowel motility Outcome: Progressing Note: Successfully had a large bowel movement   Problem: Health Behavior/Discharge Planning: Goal: Ability to manage health-related needs will improve Outcome: Not Progressing   Problem: Activity: Goal: Risk for activity intolerance will decrease Outcome: Not Progressing   Problem: Coping: Goal: Level of anxiety will decrease Outcome: Not Progressing

## 2020-11-01 NOTE — Progress Notes (Signed)
Subjective:  Renee Griffin. Wolfert is a 64 year old female with past medical history of chronic systolic and diastolic heart failure, chronic atrial fibrillation (on Eliquis,) CAD s/p CABG, aortic stenosis s/p bioprosthetic aortic valve, prior CVA of left cerebellum and pons, HTN, HLD, COPD, CKD IV, anxiety, and current tobacco use disorder who presented to [facility] on 10/26/2020 for evaluation of left flank pain found to have enlargement of known left adrenal mass with hemorrhage into neoplasm and retroperitoneum.  No significant overnight events.  Patient is seen at bedside.  She denies any new complaint.  She still have pain in the left upper quadrant of her abdomen and lower back pain.  Patient has been denying physical therapy and refused to get out of bed.  She cannot remember her last bowel movement.  Objective:  Vital signs in last 24 hours: Vitals:   10/31/20 1719 10/31/20 1935 10/31/20 2200 11/01/20 0345  BP: (!) 145/100 (!) 141/77 131/75 (!) 124/93  Pulse: (!) 110  89   Resp: 16  14   Temp: 98 F (36.7 C) 97.9 F (36.6 C)  98.4 F (36.9 C)  TempSrc: Oral Oral  Oral  SpO2: 90%  98%   Weight:    75 kg  Height:      SpO2: 98 % O2 Flow Rate (L/min): 2.5 L/min   Intake/Output Summary (Last 24 hours) at 11/01/2020 0500 Last data filed at 10/31/2020 2300 Gross per 24 hour  Intake 1160 ml  Output 300 ml  Net 860 ml   Filed Weights   10/29/20 0308 10/30/20 0400 11/01/20 0345  Weight: 74 kg 78.6 kg 75 kg   Physical Exam: General: chronically ill appearing Cardiac: Tachycardic, irregular rhythm Pulm: Normal breath sounds GI: Tenderness to palpation left upper quadrant, normal bowel sound LE: No lower extremity edema  CBC Latest Ref Rng & Units 11/01/2020 10/31/2020 10/30/2020  WBC 4.0 - 10.5 K/uL 8.4 9.7 10.6(H)  Hemoglobin 12.0 - 15.0 g/dL 9.6(L) 9.4(L) 9.1(L)  Hematocrit 36 - 46 % 30.3(L) 30.1(L) 29.4(L)  Platelets 150 - 400 K/uL 213 180 206   CMP Latest Ref Rng & Units  11/01/2020 10/31/2020 10/30/2020  Glucose 70 - 99 mg/dL 103(H) 99 118(H)  BUN 8 - 23 mg/dL 90(H) 84(H) 65(H)  Creatinine 0.44 - 1.00 mg/dL 5.59(H) 5.60(H) 4.81(H)  Sodium 135 - 145 mmol/L 136 134(L) 135  Potassium 3.5 - 5.1 mmol/L 4.6 4.9 4.6  Chloride 98 - 111 mmol/L 105 102 102  CO2 22 - 32 mmol/L 20(L) 18(L) 20(L)  Calcium 8.9 - 10.3 mg/dL 9.0 8.7(L) 8.8(L)  Total Protein 6.5 - 8.1 g/dL - - -  Total Bilirubin 0.3 - 1.2 mg/dL - - -  Alkaline Phos 38 - 126 U/L - - -  AST 15 - 41 U/L - - -  ALT 0 - 44 U/L - - -   Cortisol - 18.4 Urine sodium - 74 Creatinine, Urine - 358.14 Total protein, urine - 302 Protein / creatinine ratio - 0.84 UA - Large leukocytes, >50 WBC, many bacteria Metanephrines, plasma - in process Urine culture - multiple speciies  IMAGING: DG CHEST PORT 1 VIEW  Result Date: 10/30/2020 CLINICAL DATA:  64 year old female with shortness of breath. EXAM: PORTABLE CHEST 1 VIEW COMPARISON:  Portable chest 10/26/2020 and earlier. FINDINGS: Portable AP semi upright view at 1223 hours. Stable cardiomegaly and mediastinal contours. Lung volumes are stable and within normal limits. Mildly rotated to the right now. Allowing for portable technique the lungs are clear.  No pneumothorax. Stable thoracic inlet and right subclavian region surgical clips. Negative visible bowel gas. No acute osseous abnormality identified. IMPRESSION: Stable cardiomegaly. No acute cardiopulmonary abnormality. Electronically Signed   By: Genevie Ann M.D.   On: 10/30/2020 13:52   Assessment/Plan:  Principal Problem:   Retroperitoneal bleed Active Problems:   HTN (hypertension)   Dyslipidemia   Anxiety   Ovarian mass, left   Atrial fibrillation with RVR (HCC)   Adrenal mass greater than 4 cm in diameter (HCC)   Acute blood loss anemia   CKD (chronic kidney disease) stage 4, GFR 15-29 ml/min (HCC)   Palliative care by specialist   Goals of care, counseling/discussion   DNR (do not resuscitate)   AKI  (acute kidney injury) ([place])  Renee Griffin is a 64 year old female with past medical history of chronic systolic and diastolic heart failure, chronic atrial fibrillation (on Eliquis,) CAD s/p CABG, aortic stenosis s/p bioprosthetic aortic valve, prior CVA of left cerebellum and pons, HTN, HLD, COPD, CKD IV, anxiety, and current tobacco use disorder who presented to [facility] on 10/26/2020 for evaluation of left flank pain found to have enlargement of known left adrenal mass with hemorrhage into neoplasm and retroperitoneum.  #Retroperitoneal bleed 2/2 left adrenal mass hemmorhage s/p 2U PRBC #Hemmorhagic shock (resolved) -hgb remains stable at 9.6 this morning suggesting resolution of bleed -hemodynamically stable Plan --continue daily cbc --continue 5mg  oxycodone q6h as needed for left flank pain  #Left adrenal mass concerning for pheochromocytoma -prazosin started 11/4 -plasma metanephrines in process -Oncology has seen the patient this morning.  Per oncology, these tumors are managed surgically and there is no role of adjuvant systemic therapies.  Surgery can be performed once her blood pressure and hemodynamic status is stable.  No added therapies from oncology perspective. Plan --continue prazosin 2mg  three times daily. Will up-titrate as pressures allow --appreciate urology and oncology recommendations --will need left adrenalectomy in 6-12w  #Goals of care--DNR. Appreciate palliative care's assistance with establishing these. Changed to DNR 11/6.  Patient is unclear about surgeries.  Will need to have a sitdown conversation with patient and her brother to come up with a mutual plan.  Patient has also been refusing physical therapy, concerned about her functional status if patient refused to ambulate.  #Acute tubular necrosis, active #CKD4, chronic -renal function seems to have plateau'ed from yesterday. -remains oliguric -nephrology on board.  No indication for urgent dialysis  patient unsure if she would start HD if needed in the future. Plan --IVF with LR  --continue to monitor renal function daily --strict I/O  #Urinary tract infection, active Patient's UA revealing negative nitrites, large leukocytes, >50 WBC, many bacteria, WBC clumps.  Plan -Continue ceftriaxone--day #3/5  Physical deconditioning. PT on board and has been seeing pt however, unfortunately, pt has been refusing to work with them. On admission, she had already admitted to having a poor functional status, not being able to leave her home and having difficulty being able to walk even a few ft. It's reasonable to think that her physical condition has worsened this admission and will need 24h care. TOC on board to assist.  #Atrial fibrillation, chronic. In RVR on admission (resolved) -rate controlled at this time Plan --continue holding metoprolol. May be more important to increase prazosin if able to lower mortality risk of adrenalectomy that she will need in the near future --Holding home diltiazem and currently rate controlled --Holding home Eliquis in setting of recent bleed  #Large pelvic cyst, active #  Cystic areas in bilateral kidneys, active Patient noted to have 10 x 7cm cystic lesion in the pelvis on CT scan concerning low-grade cystic neoplasm as well as multiple cystic areas in bilateral kidneys. Similar findings noted in initial CT Abdomen/Pelvis in February of 2019. Although further workup of these lesions is warranted, patient's current acute bleed is the primary focus at this time. -Further workup pending stabilization of acute bleed  -GYN consult  -MRI pelvis with and without contrast if clinically warranted  -Consider follow-up renal sonogram or multiphase CT when the patient is able  #Hypertension, chronic Patient normotensive with continued prazosin use. -Prazosin 2mg  three times daily -Holding home metoprolol -Holding home diltiazem  #HFpEF, chronic Patient echo  reveals EF of 50-55%. Patient does not appear volume overloaded on examination. -Discontinued home torsemide 20mg  Monday, Wednesday and Friday -Continue digoxin 0.125mg  Monday, Wednesday and Friday -2.5L O2 via nasal cannula currently, wean as patient tolerated (more for comfort at this point) -Dr. Doylene Canard with cardiology is following  #HLD, chronic. Continue home gemfibrozil  #Anxiety, chronic. Continue sertraline and xanax  #Tobacco Use Disorder, chronic. Nicotine patch  Code status: Full code VTE ppx: SCDs Bowel regimen: Senna twice daily,  Diet: Renal with fluid restriction PT/OT recs: PT eval and treat 10/31/2020 D/C disposition: getting closer to discharge. Primary barrier to discharge, at this time, is the need for ongoing monitoring of her renal function to ensure at least some degree of improvement  Mitzi Hansen, MD Internal Medicine Resident PGY-2 [facility] Pager: [phone] After 5pm on weekdays and 1pm on weekends: On Call pager [phone] 11/01/2020 5:21 AM

## 2020-11-01 NOTE — Progress Notes (Signed)
PT Cancellation Note  Patient Details Name: Renee Griffin MRN: [id number] DOB: 15-Oct-1956   Cancelled Treatment:    Reason Eval/Treat Not Completed: Patient declined, no reason specified Pt adamantly refusing therapy evaluation despite max encouragement. Pt reports one of her goals is working on strengthening her legs, however, refused to allow me to educate her on bed level exercises or attempt out of bed mobility. Pt did not give explanation; denied pain. Finally, pt stating, "just leave me alone."  Wyona Almas, PT, DPT Acute Rehabilitation Services Pager [phone] Office [phone]    Deno Etienne 11/01/2020, 9:06 AM

## 2020-11-01 NOTE — Consult Note (Signed)
[place] NOTE  Patient Care Team: Dixie Dials, MD as PCP - General (Cardiology)  CHIEF COMPLAINTS/PURPOSE OF CONSULTATION:  Pheochromocytoma HISTORY OF PRESENTING ILLNESS:  Renee Griffin 64 y.o. female is here because of recent diagnosis of pheochromocytoma.  She was detected to have a left adrenal gland tumor and she was admitted to the hospital with internal hemorrhage detected in the emergency room by CT scan on 10/26/2020.  Previously she had similar episodes in the past.  She has elevated metanephrines.  The recommendation previously was also resection but she did not follow through on that apparently.  The pheochromocytoma appears to be fairly large and pushing down on the kidney and the surrounding organs.  With the retroperitoneal hemorrhage she has received blood transfusions.  She also has renal impairment as a result of this.  It was felt to be due to ischemic ATN in the setting of hemorrhagic shock and hypotension. She is being managed by cardiology and nephrology.  She is a DNR CC and palliative care was consulted who encouraged oncology consultation.  I reviewed her records extensively and collaborated the history with the patient.   MEDICAL HISTORY:  Past Medical History:  Diagnosis Date  . Depression   . Diabetes mellitus without complication ([place])   . Heart disease, congenital   . High cholesterol   . Hypertension   . Panic attack   . Stroke [facility])     SURGICAL HISTORY: Past Surgical History:  Procedure Laterality Date  . CARDIAC SURGERY  2011   CABG  . COLONOSCOPY WITH PROPOFOL N/A 02/08/2018   Procedure: COLONOSCOPY WITH PROPOFOL;  Surgeon: Otis Brace, MD;  Location: [facility]COPY;  Service: Gastroenterology;  Laterality: N/A;  . COLONOSCOPY WITH PROPOFOL N/A 12/16/2018   Procedure: COLONOSCOPY WITH PROPOFOL;  Surgeon: Ronald Lobo, MD;  Location: [facility]OSCOPY;  Service: Endoscopy;  Laterality: N/A;  . DILATION AND EVACUATION      Patient states she had an abortion 30 or 40 years ago  . ESOPHAGOGASTRODUODENOSCOPY (EGD) WITH PROPOFOL Left 02/05/2018   Procedure: ESOPHAGOGASTRODUODENOSCOPY (EGD) WITH PROPOFOL;  Surgeon: Otis Brace, MD;  Location: [facility]OS[facility];  Service: Gastroenterology;  Laterality: Left;  . IR ANGIOGRAM SELECTIVE EACH ADDITIONAL VESSEL  12/18/2018  . IR ANGIOGRAM SELECTIVE EACH ADDITIONAL VESSEL  12/18/2018  . IR ANGIOGRAM SELECTIVE EACH ADDITIONAL VESSEL  12/18/2018  . IR ANGIOGRAM SELECTIVE EACH ADDITIONAL VESSEL  12/18/2018  . IR ANGIOGRAM VISCERAL SELECTIVE  12/18/2018  . IR EMBO ART  VEN HEMORR LYMPH EXTRAV  INC GUIDE ROADMAPPING  12/18/2018  . IR US GUIDE VASC ACCESS RIGHT  12/18/2018  . LEFT HEART CATH AND CORONARY ANGIOGRAPHY N/A 02/10/2020   Procedure: LEFT HEART CATH AND CORONARY ANGIOGRAPHY;  Surgeon: Dixie Dials, MD;  Location: [facility];  Service: Cardiovascular;  Laterality: N/A;  . WISDOM TOOTH EXTRACTION      SOCIAL HISTORY: Social History   Socioeconomic History  . Marital status: Widowed    Spouse name: Not on file  . Number of children: Not on file  . Years of education: Not on file  . Highest education level: Not on file  Occupational History  . Not on file  Tobacco Use  . Smoking status: Current Every Day Smoker    Packs/day: 1.00    Years: 50.00    Pack years: 50.00    Types: Cigarettes  . Smokeless tobacco: Never Used  Vaping Use  . Vaping Use: Never used  Substance and Sexual Activity  .  Alcohol use: No    Alcohol/week: 0.0 standard drinks  . Drug use: No  . Sexual activity: Not Currently  Other Topics Concern  . Not on file  Social History Narrative  . Not on file   Social Determinants of Health   Financial Resource Strain:   . Difficulty of Paying Living Expenses: Not on file  Food Insecurity:   . Worried About Charity fundraiser in the Last Year: Not on file  . Ran Out of Food in the Last Year: Not on file  Transportation Needs:    . Lack of Transportation (Medical): Not on file  . Lack of Transportation (Non-Medical): Not on file  Physical Activity:   . Days of Exercise per Week: Not on file  . Minutes of Exercise per Session: Not on file  Stress:   . Feeling of Stress : Not on file  Social Connections:   . Frequency of Communication with Friends and Family: Not on file  . Frequency of Social Gatherings with Friends and Family: Not on file  . Attends Religious Services: Not on file  . Active Member of Clubs or Organizations: Not on file  . Attends Archivist Meetings: Not on file  . Marital Status: Not on file  Intimate Partner Violence:   . Fear of Current or Ex-Partner: Not on file  . Emotionally Abused: Not on file  . Physically Abused: Not on file  . Sexually Abused: Not on file    FAMILY HISTORY: Family History  Problem Relation Age of Onset  . Cancer Mother        uterian OR cervical  . Cancer Father        lung  . Leukemia Brother   . Cancer Sister        breast    ALLERGIES:  is allergic to amoxicillin-pot clavulanate, bupropion, and nicotine.  MEDICATIONS:  Current Facility-Administered Medications  Medication Dose Route Frequency Provider Last Rate Last Admin  . ALPRAZolam Duanne Moron) tablet 0.25 mg  0.25 mg Oral Q8H Narendra, Nischal, MD   0.25 mg at 11/01/20 0814  . bisacodyl (DULCOLAX) suppository 10 mg  10 mg Rectal Daily PRN Rosezella Rumpf, NP      . cefTRIAXone (ROCEPHIN) 1 g in sodium chloride 0.9 % 100 mL IVPB  1 g Intravenous Q24H Reesa Chew, MD 200 mL/hr at 11/01/20 0820 1 g at 11/01/20 0820  . digoxin (LANOXIN) tablet 0.125 mg  0.125 mg Oral Q M,W,F Christian, Rylee, MD   0.125 mg at 10/30/20 0927  . diphenhydrAMINE-zinc acetate (BENADRYL) 2-0.1 % cream   Topical BID PRN Rosezella Rumpf, NP      . gemfibrozil (LOPID) tablet 600 mg  600 mg Oral BID Darrick Meigs, Rylee, MD   600 mg at 10/31/20 2309  . nicotine (NICODERM CQ - dosed in mg/24 hours) patch 21  mg  21 mg Transdermal Daily Dixie Dials, MD   21 mg at 10/31/20 1026  . oxyCODONE (Oxy IR/ROXICODONE) immediate release tablet 5 mg  5 mg Oral Q6H PRN Mitzi Hansen, MD   5 mg at 11/01/20 0815  . oxymetazoline (AFRIN) 0.05 % nasal spray 1 spray  1 spray Each Nare BID PRN Lacinda Axon, MD   1 spray at 10/30/20 0618  . prazosin (MINIPRESS) capsule 2 mg  2 mg Oral TID Alexis Frock, MD   2 mg at 10/31/20 2308  . senna (SENOKOT) tablet 8.6 mg  1 tablet Oral BID Mitzi Hansen, MD  8.6 mg at 10/31/20 2308  . sertraline (ZOLOFT) tablet 100 mg  100 mg Oral QHS Christian, Rylee, MD   100 mg at 10/31/20 2308    REVIEW OF SYSTEMS:   Awake alert and oriented Receiving IV ceftriaxone Otherwise denies any symptoms at this time. She does have some chronic back discomfort. Left flank pain is still present.  PHYSICAL EXAMINATION: ECOG PERFORMANCE STATUS: 2 - Symptomatic, <50% confined to bed  Vitals:   11/01/20 0600 11/01/20 0731  BP: (!) 128/96   Pulse: 86   Resp: 14   Temp:  98.4 F (36.9 C)  SpO2: 99%    Filed Weights   10/29/20 0308 10/30/20 0400 11/01/20 0345  Weight: 163 lb 2.3 oz (74 kg) 173 lb 4.5 oz (78.6 kg) 165 lb 5.5 oz (75 kg)    GENERAL:alert, no distress and comfortable SKIN: skin color, texture, turgor are normal, no rashes or significant lesions EYES: normal, conjunctiva are pink and non-injected, sclera clear OROPHARYNX:no exudate, no erythema and lips, buccal mucosa, and tongue normal  NECK: supple, thyroid normal size, non-tender, without nodularity LYMPH:  no palpable lymphadenopathy in the cervical, axillary or inguinal LUNGS: clear to auscultation and percussion with normal breathing effort HEART: regular rate & rhythm and no murmurs and no lower extremity edema ABDOMEN: No tenderness in the upper splenomegaly. PSYCH: alert & oriented x 3 with fluent speech NEURO: no focal motor/sensory deficits  LABORATORY DATA:  I have reviewed the data as  listed Lab Results  Component Value Date   WBC 8.4 11/01/2020   HGB 9.6 (L) 11/01/2020   HCT 30.3 (L) 11/01/2020   MCV 97.7 11/01/2020   PLT 213 11/01/2020   Lab Results  Component Value Date   NA 136 11/01/2020   K 4.6 11/01/2020   CL 105 11/01/2020   CO2 20 (L) 11/01/2020    RADIOGRAPHIC STUDIES: I have personally reviewed the radiological reports and agreed with the findings in the report. CT abdomen pelvis 10/26/2020: Interval enlargement of the left adrenal mass displacing the kidney inferiorly and bowel loops and pancreas anteriorly with mass-effect.  Hemorrhage into the renal neoplasm and into the adjacent retroperitoneum.  Ascites in the abdomen.  Cystic lesion in the pelvis 10 x 7 cm previously 8 x 6 cm.   ASSESSMENT AND PLAN:  Pheochromocytoma 14 cm with hemorrhage internally and into the retroperitoneum: These tumors are managed surgically. There is no role of adjuvant systemic therapies for these tumors.  Majority of them are cured with surgery alone.  The risk of long-term relapse can be up to 15%. They do not metastasize usually.  Even for metastatic tumors there is no systemic treatment options.  Once the patient's blood pressure and hemodynamic status is under good control, surgery is advised.  Anemia: Received blood transfusion: Relatively stable hemoglobin 9.6.  Ovarian cyst: Longstanding: Can be monitored as an outpatient with GYN.  There are no other medical oncology recommendations for this patient. We will sign off.  All questions were answered. The patient knows to call the clinic with any problems, questions or concerns.    Harriette Ohara, MD @T @

## 2020-11-01 NOTE — Progress Notes (Signed)
Nephrology Follow-Up Consult note   Assessment/Recommendations: Renee Griffin is a/an 64 y.o. female with a past medical history significant for CHF, atrial fibrillation, CAD status post CABG, aortic stenosis status post valve replacement, CVA, HTN, HLD, COPD, CKD, tobacco use disorder, adrenal mass possibly pheochromocytoma, admitted for left adrenal mass hemorrhage.     1. Nonoliguric AKI/CKD stage 3-4- presumably due to ischemic ATN in setting of hemorrhagic shock and hypotension.  CT scan without hydronephrosis but does have non-obstructing stone in the left kidney.  She also admitted to taking NSAIDs before coming to the hospital.   1. Urine output not well documented, continue to monitor 2. No indication for dialysis at this time.  Creatinine now plateauing likely will start to improve 3. She is undecided about dialysis, appreciate palliative care involvement, continue conversations 4. Strict intake and output, monitor creatinine daily  5. Avoid nephrotoxic agents and maintain BP 2. Hemorrhagic shock- Hgb and BP improving after transfusions.  Continue to follow.  1. Transfuse as needed. 3. Hypotension: Overall improved.  Continue to monitor per primary team 4. Large left adrenal neoplasm with recurrent bleed- Urology following.  Will eventually need left adrenalectomy in 6-12 weeks.  Possible pheochromocytoma 5. Refractory HTN-history of such.  Blood pressure controlled at this current time 6. Atrial fibrillation- chronic and was on eliquis but holding dilt and anticoagulation for now.  7. Bilateral renal cysts- concerning for low-grade cystic neoplasm.  Urology following.  8. Chronic combined systolic and diastolic CHF- stable 9. UTI - extensive pyuria possibly contributing to AKI. CTX x 5 days.   Recommendations conveyed to primary service.    [facility] 11/01/2020 8:14 AM  ___________________________________________________________  CC: AKI on  CKD  Interval History/Subjective: Patient resting today with no complaints.  Blood pressure stable.  Creatinine stable from yesterday.   Medications:  Current Facility-Administered Medications  Medication Dose Route Frequency Provider Last Rate Last Admin  . ALPRAZolam Duanne Moron) tablet 0.25 mg  0.25 mg Oral Q8H Narendra, Nischal, MD   0.25 mg at 10/31/20 2308  . bisacodyl (DULCOLAX) suppository 10 mg  10 mg Rectal Daily PRN Rosezella Rumpf, NP      . cefTRIAXone (ROCEPHIN) 1 g in sodium chloride 0.9 % 100 mL IVPB  1 g Intravenous Q24H Reesa Chew, MD 200 mL/hr at 10/31/20 0812 1 g at 10/31/20 0812  . digoxin (LANOXIN) tablet 0.125 mg  0.125 mg Oral Q M,W,F Christian, Rylee, MD   0.125 mg at 10/30/20 0927  . diphenhydrAMINE-zinc acetate (BENADRYL) 2-0.1 % cream   Topical BID PRN Rosezella Rumpf, NP      . gemfibrozil (LOPID) tablet 600 mg  600 mg Oral BID Darrick Meigs, Rylee, MD   600 mg at 10/31/20 2309  . nicotine (NICODERM CQ - dosed in mg/24 hours) patch 21 mg  21 mg Transdermal Daily Dixie Dials, MD   21 mg at 10/31/20 1026  . oxyCODONE (Oxy IR/ROXICODONE) immediate release tablet 5 mg  5 mg Oral Q6H PRN Mitzi Hansen, MD   5 mg at 10/31/20 1621  . oxymetazoline (AFRIN) 0.05 % nasal spray 1 spray  1 spray Each Nare BID PRN Lacinda Axon, MD   1 spray at 10/30/20 0618  . prazosin (MINIPRESS) capsule 2 mg  2 mg Oral TID Alexis Frock, MD   2 mg at 10/31/20 2308  . senna (SENOKOT) tablet 8.6 mg  1 tablet Oral BID Darrick Meigs, Rylee, MD   8.6 mg at 10/31/20 2308  .  sertraline (ZOLOFT) tablet 100 mg  100 mg Oral QHS Christian, Rylee, MD   100 mg at 10/31/20 2308      Physical Exam: Vitals:   11/01/20 0600 11/01/20 0731  BP: (!) 128/96   Pulse: 86   Resp: 14   Temp:  98.4 F (36.9 C)  SpO2: 99%    No intake/output data recorded.  Intake/Output Summary (Last 24 hours) at 11/01/2020 0814 Last data filed at 10/31/2020 2300 Gross per 24 hour  Intake 1160 ml   Output 300 ml  Net 860 ml   Constitutional: Chronically ill-appearing, lying in bed, no distress ENMT: ears and nose without scars or lesions, dry mucous membranes CV: normal rate, no edema Respiratory: Bilateral chest rise, increased work of breathing Gastrointestinal: soft, nondistended Skin: no visible lesions or rashes   Test Results I personally reviewed new and old clinical labs and radiology tests Lab Results  Component Value Date   NA 136 11/01/2020   K 4.6 11/01/2020   CL 105 11/01/2020   CO2 20 (L) 11/01/2020   BUN 90 (H) 11/01/2020   CREATININE 5.59 (H) 11/01/2020   CALCIUM 9.0 11/01/2020   ALBUMIN 3.1 (L) 10/27/2020

## 2020-11-02 LAB — METANEPHRINES, PLASMA
Metanephrine, Free: 67 pg/mL (ref 0.0–88.0)
Normetanephrine, Free: 346.7 pg/mL — ABNORMAL HIGH (ref 0.0–191.8)

## 2020-11-02 LAB — BASIC METABOLIC PANEL
Anion gap: 14 (ref 5–15)
BUN: 92 mg/dL — ABNORMAL HIGH (ref 8–23)
CO2: 16 mmol/L — ABNORMAL LOW (ref 22–32)
Calcium: 9.2 mg/dL (ref 8.9–10.3)
Chloride: 104 mmol/L (ref 98–111)
Creatinine, Ser: 5.4 mg/dL — ABNORMAL HIGH (ref 0.44–1.00)
GFR, Estimated: 8 mL/min — ABNORMAL LOW (ref >60)
Glucose, Bld: 98 mg/dL (ref 70–99)
Potassium: 4.4 mmol/L (ref 3.5–5.1)
Sodium: 134 mmol/L — ABNORMAL LOW (ref 135–145)

## 2020-11-02 LAB — CBC
HCT: 31.9 % — ABNORMAL LOW (ref 36.0–46.0)
Hemoglobin: 10 g/dL — ABNORMAL LOW (ref 12.0–15.0)
MCH: 30.7 pg (ref 26.0–34.0)
MCHC: 31.3 g/dL (ref 30.0–36.0)
MCV: 97.9 fL (ref 80.0–100.0)
Platelets: 257 10*3/uL (ref 150–400)
RBC: 3.26 MIL/uL — ABNORMAL LOW (ref 3.87–5.11)
RDW: 16.6 % — ABNORMAL HIGH (ref 11.5–15.5)
WBC: 8.9 10*3/uL (ref 4.0–10.5)
nRBC: 0 % (ref 0.0–0.2)

## 2020-11-02 MED ORDER — STERILE WATER FOR INJECTION IV SOLN
INTRAVENOUS | Status: DC
  Filled 2020-11-02 (×2): qty 9.62

## 2020-11-02 MED ORDER — ACETAMINOPHEN 325 MG PO TABS
650.0000 mg | ORAL_TABLET | Freq: Four times a day (QID) | ORAL | Status: DC | PRN
  Administered 2020-11-04 – 2020-11-05 (×2): 650 mg via ORAL
  Filled 2020-11-02 (×2): qty 2

## 2020-11-02 NOTE — Progress Notes (Signed)
This chaplain was present to update the Pt. on the referral to spiritual care. The chaplain plans to facilitate the notarization of the  Pt. Advance Directive on Tuesday.   During the visit, the chaplain learned from the Pt. and then updated the Pt. RN-Heather, the Pt. is experiencing pain in her back. The Pt. inquired about pain medication.   F/U spiritual care will continue to be available as needed.

## 2020-11-02 NOTE — Plan of Care (Signed)
Pt verbalizing understanding of liver condition and plan of care needed to improve health status. Vitals improving.

## 2020-11-02 NOTE — Evaluation (Signed)
Physical Therapy Evaluation Patient Details Name: Renee Griffin MRN: [id number] DOB: [birth year] Today's Date: 11/02/2020   History of Present Illness  Renee Griffin is a 64 year old female with past medical history ofchronic systolic and diastolic heart failure, chronic atrial fibrillation (on Eliquis,) CAD s/p CABG, aortic stenosis s/p bioprosthetic aortic valve, prior CVA of left cerebellum and pons,HTN,HLD,COPD, CKD IV, anxiety, and currenttobacco use disorder who presented to [facility] on 10/26/2020 for evaluation of left flank pain found to have enlargement of known left adrenal mass concerning for pheochromocytoma with hemorrhage.  Clinical Impression  Prior to admission, pt lives alone, uses a quad cane for mobility, and is independent with ADL's. Pt presents with significant change from her baseline, demonstrating generalized weakness/debility, balance deficits, cognitive impairments and decreased endurance. Requiring moderate assist for transfers to standing. Pt also very self limiting and difficult to engage/motivate to increase level of mobility. In light of deficits and decreased caregiver support, recommending SNF at discharge.    Follow Up Recommendations SNF    Equipment Recommendations  None recommended by PT    Recommendations for Other Services       Precautions / Restrictions Precautions Precautions: Fall Restrictions Weight Bearing Restrictions: No      Mobility  Bed Mobility Overal bed mobility: Needs Assistance Bed Mobility: Supine to Sit;Sit to Supine     Supine to sit: Mod assist Sit to supine: Mod assist   General bed mobility comments: ModA for trunk to upright sitting position, assist for BLE's back into bed    Transfers Overall transfer level: Needs assistance Equipment used: Rolling walker (2 wheeled) Transfers: Sit to/from Stand Sit to Stand: Mod assist         General transfer comment: ModA to rise from edge of bed x 2, able to take  side steps towards left with walker management provided by PT, but refused further ambulation  Ambulation/Gait             General Gait Details: refused  Stairs            Wheelchair Mobility    Modified Rankin (Stroke Patients Only)       Balance Overall balance assessment: Needs assistance Sitting-balance support: Feet supported Sitting balance-Leahy Scale: Good     Standing balance support: Bilateral upper extremity supported Standing balance-Leahy Scale: Poor                               Pertinent Vitals/Pain Pain Assessment: Faces Faces Pain Scale: Hurts little more Pain Location: denies pain but seems to have generalized discomfort Pain Descriptors / Indicators: Discomfort Pain Intervention(s): Monitored during session    Home Living Family/patient expects to be discharged to:: Private residence Living Arrangements: Alone Available Help at Discharge: Family;Available PRN/intermittently Type of Home: Apartment Home Access: Stairs to enter Entrance Stairs-Rails: None Entrance Stairs-Number of Steps: 1 Home Layout: One level Home Equipment: Cane - quad;Walker - 2 wheels;Grab bars - tub/shower Additional Comments: Reports quad cane was lost in transition to hospital.     Prior Function Level of Independence: Independent with assistive device(s)         Comments: Reports using quad cane at baseline;no assist for ADL/IADL. Pt drives/manages meds and grocery shops     Hand Dominance   Dominant Hand: Left    Extremity/Trunk Assessment   Upper Extremity Assessment Upper Extremity Assessment: Generalized weakness    Lower Extremity Assessment Lower Extremity Assessment: Generalized weakness  Communication   Communication: No difficulties  Cognition Arousal/Alertness: Awake/alert Behavior During Therapy: Flat affect Overall Cognitive Status: Impaired/Different from baseline Area of Impairment:  Orientation;Memory;Safety/judgement;Awareness;Problem solving;Following commands                 Orientation Level: Disoriented to;Time   Memory: Decreased short-term memory Following Commands: Follows one step commands with increased time Safety/Judgement: Decreased awareness of safety;Decreased awareness of deficits Awareness: Intellectual Problem Solving: Slow processing General Comments: Pt not oriented to year, stating it was 2012. Very difficult to engage or motivate in any capacity despite attempts; slower processing speed and follows one step commands with increased time.       General Comments      Exercises     Assessment/Plan    PT Assessment Patient needs continued PT services  PT Problem List Decreased strength;Decreased activity tolerance;Decreased balance;Decreased mobility;Decreased cognition;Decreased safety awareness       PT Treatment Interventions DME instruction;Gait training;Therapeutic activities;Functional mobility training;Therapeutic exercise;Balance training;Patient/family education    PT Goals (Current goals can be found in the Care Plan section)  Acute Rehab PT Goals Patient Stated Goal: would not state PT Goal Formulation: With patient Time For Goal Achievement: 11/16/20 Potential to Achieve Goals: Fair    Frequency Min 2X/week   Barriers to discharge        Co-evaluation               AM-PAC PT "6 Clicks" Mobility  Outcome Measure Help needed turning from your back to your side while in a flat bed without using bedrails?: A Little Help needed moving from lying on your back to sitting on the side of a flat bed without using bedrails?: A Lot Help needed moving to and from a bed to a chair (including a wheelchair)?: A Lot Help needed standing up from a chair using your arms (e.g., wheelchair or bedside chair)?: A Lot Help needed to walk in hospital room?: A Lot Help needed climbing 3-5 steps with a railing? : A Lot 6 Click  Score: 13    End of Session Equipment Utilized During Treatment: Gait belt Activity Tolerance: Other (comment) (self limiting) Patient left: in bed;with call bell/phone within reach;with bed alarm set   PT Visit Diagnosis: Muscle weakness (generalized) (M62.81);Unsteadiness on feet (R26.81);Difficulty in walking, not elsewhere classified (R26.2)    Time: 1308-6578 PT Time Calculation (min) (ACUTE ONLY): 33 min   Charges:   PT Evaluation $PT Eval Moderate Complexity: 1 Mod PT Treatments $Therapeutic Activity: 8-22 mins        Wyona Almas, PT, DPT Acute Rehabilitation Services Pager [phone] Office [phone]   Deno Etienne 11/02/2020, 5:10 PM

## 2020-11-02 NOTE — Progress Notes (Signed)
°   11/02/20 1946  Assess: MEWS Score  BP (!) 142/82  Pulse Rate 73  ECG Heart Rate 85  Resp 20  Level of Consciousness Alert  SpO2 94 %  O2 Device Room Air  Patient Activity (if Appropriate) In bed  Assess: MEWS Score  MEWS Temp 0  MEWS Systolic 0  MEWS Pulse 0  MEWS RR 0  MEWS LOC 0  MEWS Score 0  MEWS Score Color Green  Treat  Pain Scale 0-10  Pain Score 0

## 2020-11-02 NOTE — Progress Notes (Signed)
Subjective:  Renee Griffin. Renee Griffin is a 64 year old female with past medical history of chronic systolic and diastolic heart failure, chronic atrial fibrillation (on Eliquis,) CAD s/p CABG, aortic stenosis s/p bioprosthetic aortic valve, prior CVA of left cerebellum and pons, HTN, HLD, COPD, CKD IV, anxiety, and current tobacco use disorder who presented to [facility] on 10/26/2020 for evaluation of left flank pain found to have enlargement of known left adrenal mass concerning for pheochromocytoma with hemorrhage into neoplasm and retroperitoneum.  Overnight, no acute events.  This morning, patient reports that she feels tired and complains of ongoing pain of left back. Patient states that she has previously declined physical therapy because of her frustration of her overall medical condition, however she is agreeable to working with physical therapy today. She has no further questions or concerns.  Objective:  Vital signs in last 24 hours: Vitals:   11/01/20 2300 11/02/20 0000 11/02/20 0400 11/02/20 0500  BP: 108/66 108/64 129/70   Pulse: 61 74 74   Resp: 18 15 13    Temp:      TempSrc:      SpO2: 96% 95% 97%   Weight:    75.1 kg  Height:      SpO2: 97 % O2 Flow Rate (L/min): 2.5 L/min   Intake/Output Summary (Last 24 hours) at 11/02/2020 0707 Last data filed at 11/01/2020 2300 Gross per 24 hour  Intake 834 ml  Output 1000 ml  Net -166 ml   Filed Weights   10/30/20 0400 11/01/20 0345 11/02/20 0500  Weight: 78.6 kg 75 kg 75.1 kg   Physical Exam Vitals and nursing note reviewed. Exam conducted with a chaperone present.  Constitutional:      General: She is not in acute distress.    Appearance: She is ill-appearing. She is not toxic-appearing.  HENT:     Head: Normocephalic and atraumatic.  Eyes:     Extraocular Movements: Extraocular movements intact.     Conjunctiva/sclera: Conjunctivae normal.  Cardiovascular:     Rate and Rhythm: Normal rate. Rhythm irregular.  Pulmonary:       Effort: No respiratory distress.     Breath sounds: Normal breath sounds.  Abdominal:     General: Abdomen is flat. Bowel sounds are normal.     Palpations: Abdomen is soft.     Tenderness: There is abdominal tenderness.     Comments: Tenderness to palpation of left abdomen  Musculoskeletal:        General: Normal range of motion.     Cervical back: Normal range of motion and neck supple.     Right lower leg: No edema.     Left lower leg: No edema.  Skin:    General: Skin is warm and dry.     Capillary Refill: Capillary refill takes less than 2 seconds.  Neurological:     General: No focal deficit present.     Mental Status: She is alert. Mental status is at baseline.  Psychiatric:        Mood and Affect: Mood normal.        Behavior: Behavior normal.        Thought Content: Thought content normal.        Judgment: Judgment normal.    CBC Latest Ref Rng & Units 11/02/2020 11/01/2020 10/31/2020  WBC 4.0 - 10.5 K/uL 8.9 8.4 9.7  Hemoglobin 12.0 - 15.0 g/dL 10.0(L) 9.6(L) 9.4(L)  Hematocrit 36 - 46 % 31.9(L) 30.3(L) 30.1(L)  Platelets 150 - 400  K/uL 257 213 180   CMP Latest Ref Rng & Units 11/02/2020 11/01/2020 10/31/2020  Glucose 70 - 99 mg/dL 98 103(H) 99  BUN 8 - 23 mg/dL 92(H) 90(H) 84(H)  Creatinine 0.44 - 1.00 mg/dL 5.40(H) 5.59(H) 5.60(H)  Sodium 135 - 145 mmol/L 134(L) 136 134(L)  Potassium 3.5 - 5.1 mmol/L 4.4 4.6 4.9  Chloride 98 - 111 mmol/L 104 105 102  CO2 22 - 32 mmol/L 16(L) 20(L) 18(L)  Calcium 8.9 - 10.3 mg/dL 9.2 9.0 8.7(L)  Total Protein 6.5 - 8.1 g/dL - - -  Total Bilirubin 0.3 - 1.2 mg/dL - - -  Alkaline Phos 38 - 126 U/L - - -  AST 15 - 41 U/L - - -  ALT 0 - 44 U/L - - -   Cortisol - 18.4 Urine sodium - 74 Creatinine, Urine - 358.14 Total protein, urine - 302 Protein / creatinine ratio - 0.84 UA - Large leukocytes, >50 WBC, many bacteria Urine culture - multiple species present, suggest recollection Metanephrines, plasma: - Normetanephrine 346.7  (reference range 0.0 - 285.2) - Free metanephrine 67.0 (reference range 0.0-88.0)  IMAGING: No results found.   Assessment/Plan:  Renee Griffin. [name] is a 64 year old female with past medical history of chronic systolic and diastolic heart failure, chronic atrial fibrillation (on Eliquis,) CAD s/p CABG, aortic stenosis s/p bioprosthetic aortic valve, prior CVA of left cerebellum and pons, HTN, HLD, COPD, CKD IV, anxiety, and current tobacco use disorder who presented to [facility] on 10/26/2020 for evaluation of left flank pain found to have enlargement of known left adrenal mass concerning for pheochromocytoma with hemorrhage into neoplasm and retroperitoneum.  Principal Problem:   Retroperitoneal bleed Active Problems:   HTN (hypertension)   Dyslipidemia   Anxiety   Ovarian mass, left   Atrial fibrillation with RVR (HCC)   Adrenal mass greater than 4 cm in diameter (HCC)   Acute blood loss anemia   CKD (chronic kidney disease) stage 4, GFR 15-29 ml/min (HCC)   Palliative care by specialist   Goals of care, counseling/discussion   DNR (do not resuscitate)   AKI (acute kidney injury) ([place])   Hemorrhagic shock ([place])  #Hemmorhagic shock 2/2 left adrenal mass hemorrhage, resolved Hemoglobin continues to trend upwards, most recently 10.0, suggesting resolution of her bleed. We will continue to monitor her condition closely and hold her home anticoagulation. -Daily CBC, close attention to Hb -Holding home Eliquis -Continue 5mg  oxycodone q6h as needed for left flank pain (pain appears to be improving)  #Left adrenal mass concerning for pheochromocytoma, active -Patient with large left adrenal mass, initially diagnosed in 2019 and initial workup revealed elevated normetanephrines. Patient did not pursue further workup. Repeat metanephrines revealing mild improvement, but continued elevation. Most concerning for pheochromocytoma at this time. Urology initiated treatment with prazosin with plan to  undergo left adrenalectomy in 6 to 12 weeks. Oncology evaluated patient and reported that there is no role for adjuvant systemic therapies for this condition. -Continue prazosin 2mg  three times daily. Will up-titrate as pressures allow -Appreciate urology recommendations -Left adrenalectomy in 6-12 weeks  #Non-oliguric AKI in setting of CKD3-4, active Patient developed acute tubular necrosis secondary to hemorrhagic shock in the setting of her bleeding adrenal mass. Renal function worsened from 1.9 on arrival to 5.60 two days ago. Creatinine function slowly improving, most recently 5.40. She continues to have appropriate urine output with 1L out in the past 24 hours. Nephrology following with no plan for urgent dialysis at this point. -  Daily BMP, monitor creatinine -strict I/O -Appreciate nephrology recommendations  #Urinary tract infection, active Patient's UA revealing negative nitrites, large leukocytes, >50 WBC, many bacteria, WBC clumps.  -Continue ceftriaxone, day 4 of 5  #Physical deconditioning PT on board and has been seeing pt however, unfortunately, pt has been refusing to work with them. On admission, she had already admitted to having a poor functional status, not being able to leave her home and having difficulty being able to walk even a few ft. It's reasonable to think that her physical condition has worsened this admission and will need 24h care. TOC on board to assist.  #Atrial fibrillation, chronic Patient's heart rate elevated yesterday to the low 100-110s -Restarted metoprolol 25mg  twice daily -Holding home diltiazem -Holding home apixaban  #Large pelvic cyst, active #Cystic areas in bilateral kidneys, active Patient noted to have 10 x 7cm cystic lesion in the pelvis on CT scan concerning low-grade cystic neoplasm as well as multiple cystic areas in bilateral kidneys. Similar findings noted in initial CT Abdomen/Pelvis in February of 2019. Although further workup of  these lesions is warranted, patient's current acute bleed is the primary focus at this time. -Further workup pending stabilization of acute bleed  -GYN consult  -MRI pelvis with and without contrast if clinically warranted  -Consider follow-up renal sonogram or multiphase CT when the patient is able  #Hypertension, chronic Patient normotensive with continued prazosin use. -Prazosin 2mg  three times daily -Metoprolol 25mg  twice daily -Holding home diltiazem  #HFpEF, chronic Patient echo reveals EF of 50-55%. Patient does not appear volume overloaded on examination. -Discontinued home torsemide 20mg  Monday, Wednesday and Friday -Continue digoxin 0.125mg  Monday, Wednesday and Friday -2.5L O2 via nasal cannula currently, wean as patient tolerated (more for comfort at this point) -Dr. Doylene Canard with cardiology is following  #HLD, chronic -Continue home gemfibrozil  #Anxiety, chronic -Continue sertraline and xanax  #Tobacco Use Disorder, chronic  -Nicotine patch  Code status: Full code VTE ppx: SCDs Bowel regimen: Senna twice daily Diet: Renal with fluid restriction PT/OT recs: Patient refusing PT at this time D/C disposition: getting closer to discharge. Primary barrier to discharge, at this time, is the need for ongoing monitoring of her renal function to ensure at least some degree of improvement  Foy Guadalajara, MD Internal Medicine Resident PGY-2 [facility] Pager: [phone] After 5pm on weekdays and 1pm on weekends: On Call pager [phone] 11/02/2020 7:07 AM

## 2020-11-02 NOTE — Progress Notes (Signed)
Admit: 10/26/2020 LOS: 7  Renee Griffin is a/an 64 y.o. female with a past medical history significant for CHF, atrial fibrillation, CAD status post CABG, aortic stenosis status post valve replacement, CVA, HTN, HLD, COPD, CKD, tobacco use disorder, adrenal mass possibly pheochromocytoma, admitted for left adrenal mass hemorrhage.     Subjective:  . No acute events overnight.  Continues to have some pain on left side and abdominal area.  Denies any chest pain, SOB, nausea or vomiting.  Reports voiding well without hematuria and has been passing gas.    11/07 0701 - 11/08 0700 In: 419 [P.O.:834] Out: 1000 [Urine:1000]  Filed Weights   10/30/20 0400 11/01/20 0345 11/02/20 0500  Weight: 78.6 kg 75 kg 75.1 kg    Scheduled Meds: . ALPRAZolam  0.25 mg Oral Q8H  . digoxin  0.125 mg Oral Q M,W,F  . gemfibrozil  600 mg Oral BID  . metoprolol tartrate  25 mg Oral BID  . nicotine  21 mg Transdermal Daily  . prazosin  2 mg Oral TID  . senna  1 tablet Oral BID  . sertraline  100 mg Oral QHS   Continuous Infusions: . cefTRIAXone (ROCEPHIN)  IV Stopped (11/01/20 1000)   PRN Meds:. Current Labs: reviewed  Na 134, BUN 92, Cr 5.40, Hbg 10.0   Physical Exam:  Blood pressure 129/70, pulse 74, temperature 98.9 F (37.2 C), temperature source Oral, resp. rate 13, height 5\' 3"  (1.6 m), weight 75.1 kg, SpO2 97 %. GEN: wdwn, sitting in bed, nad ENT: no nasal discharge, mmm EYES: no scleral icterus, eomi CV: normal rate, no murmurs PULM: no iwob, bilateral chest rise ABD: NABS, non-distended, left abdominal tenderness SKIN: no rashes or jaundice EXT: no edema, warm and well perfused   A/P  Non oliguric AKI/CKD stage 3-4 Likely secondary to ischemic ATN in the setting of hemorrhagic shock and hypotension. No hydronephrosis seen on CT scan but there was a non obstructing stone seen in the left kidney.  Previous history of taking NSAIDs prior to hospitalization. -Urine output >1L.   -Creatinine  slowly improving.  Dialysis not indicated at this time. -Monitor Cr daily -Continue to monitor urine output, strict intake and output -Avoid nephrotoxic agents -Maintain BP to MAP >65 -Palliative care has been consulted, will continue conversations regarding dialysis should the need arise  Hemorrhagic Shock Normotensive and Hbg improving.   Continue current management and will follow Transfuse <8 given CAD  Hypotension Improved Primary team monitoring and managing  Large Left Neoplasm with recurrent bleed Likely Pheochromocytoma given elevated metanepherines and refractory HTN.  -Urology following and recommends adrenalectomy in 6-12 weeks  Refractory HTN Likely secondary to adrenal mass BP control with alpha blockers  Afib Was on Eliquis.  Holding now secondary to bleed Continue to hold anticoagulation for now, primary team managing  Bilateral Renal Cysts Urology following.  Concern for low grade neoplasm.  Combined CHF Cardiology following  UTI Likely contributing to AKI Continue Ceftriaxone 1gm q24hr for total of 5 day course (11/06-11/10) Monitor for recurrent signs of infection.   Carollee Leitz, MD Family Medicine Residency  11/02/2020, 8:01 AM  Recent Labs  Lab 10/31/20 0340 11/01/20 0313 11/02/20 0526  NA 134* 136 134*  K 4.9 4.6 4.4  CL 102 105 104  CO2 18* 20* 16*  GLUCOSE 99 103* 98  BUN 84* 90* 92*  CREATININE 5.60* 5.59* 5.40*  CALCIUM 8.7* 9.0 9.2   Recent Labs  Lab 10/28/20 1140 10/28/20 1552 10/31/20 0340  11/01/20 0313 11/02/20 0526  WBC 17.6*   < > 9.7 8.4 8.9  NEUTROABS 15.8*  --   --   --   --   HGB 8.9*   < > 9.4* 9.6* 10.0*  HCT 28.5*   < > 30.1* 30.3* 31.9*  MCV 97.3   < > 96.5 97.7 97.9  PLT 177   < > 180 213 257   < > = values in this interval not displayed.    Plan communicated to the primary team

## 2020-11-02 NOTE — Consult Note (Addendum)
Ref: Dixie Dials, MD   Subjective:  No chest pain or shortness of breath. She has not started ambulation. VS stable. HR back in 70-90 bpm. Atrial fibrillation continues. Normetanephrine levels of 346.7 are about100 pg lower than previous level 2 years ago. It remains in indeterminate range but higher than normal range of 191.8 pg. Patient aware of surgery postponed by 2-3 months.  Objective:  Vital Signs in the last 24 hours: Temp:  [97.9 F (36.6 C)-98.9 F (37.2 C)] 98.9 F (37.2 C) (11/07 1611) Pulse Rate:  [61-120] 74 (11/08 0400) Cardiac Rhythm: Atrial fibrillation (11/08 0737) Resp:  [13-20] 13 (11/08 0400) BP: (99-141)/(62-105) 129/70 (11/08 0400) SpO2:  [92 %-98 %] 97 % (11/08 0400) Weight:  [75.1 kg] 75.1 kg (11/08 0500)  Physical Exam: BP Readings from Last 1 Encounters:  11/02/20 129/70     Wt Readings from Last 1 Encounters:  11/02/20 75.1 kg    Weight change: 0.1 kg Body mass index is 29.33 kg/m. HEENT: [place]/AT, Eyes-Blue, PERL, EOMI, Conjunctiva-Pale pink, Sclera-Non-icteric Neck: No JVD, No bruit, Trachea midline. Lungs:  Clearing, Bilateral. Cardiac:  Irregular rhythm, normal S1 and S2, no S3. II/VI systolic murmur. Abdomen:  Soft, non-tender. BS present. Extremities:  No edema present. No cyanosis. No clubbing. CNS: AxOx3, Cranial nerves grossly intact, moves all 4 extremities.  Skin: Warm and dry.   Intake/Output from previous day: 11/07 0701 - 11/08 0700 In: 834 [P.O.:834] Out: 1000 [Urine:1000]    Lab Results: BMET    Component Value Date/Time   NA 134 (L) 11/02/2020 0526   NA 136 11/01/2020 0313   NA 134 (L) 10/31/2020 0340   K 4.4 11/02/2020 0526   K 4.6 11/01/2020 0313   K 4.9 10/31/2020 0340   CL 104 11/02/2020 0526   CL 105 11/01/2020 0313   CL 102 10/31/2020 0340   CO2 16 (L) 11/02/2020 0526   CO2 20 (L) 11/01/2020 0313   CO2 18 (L) 10/31/2020 0340   GLUCOSE 98 11/02/2020 0526   GLUCOSE 103 (H) 11/01/2020 0313   GLUCOSE 99  10/31/2020 0340   BUN 92 (H) 11/02/2020 0526   BUN 90 (H) 11/01/2020 0313   BUN 84 (H) 10/31/2020 0340   CREATININE 5.40 (H) 11/02/2020 0526   CREATININE 5.59 (H) 11/01/2020 0313   CREATININE 5.60 (H) 10/31/2020 0340   CALCIUM 9.2 11/02/2020 0526   CALCIUM 9.0 11/01/2020 0313   CALCIUM 8.7 (L) 10/31/2020 0340   GFRNONAA 8 (L) 11/02/2020 0526   GFRNONAA 8 (L) 11/01/2020 0313   GFRNONAA 8 (L) 10/31/2020 0340   GFRAA 34 (L) 08/24/2020 0919   GFRAA 29 (L) 07/19/2020 0920   GFRAA 32 (L) 07/13/2020 0604   CBC    Component Value Date/Time   WBC 8.9 11/02/2020 0526   RBC 3.26 (L) 11/02/2020 0526   HGB 10.0 (L) 11/02/2020 0526   HCT 31.9 (L) 11/02/2020 0526   PLT 257 11/02/2020 0526   MCV 97.9 11/02/2020 0526   MCH 30.7 11/02/2020 0526   MCHC 31.3 11/02/2020 0526   RDW 16.6 (H) 11/02/2020 0526   LYMPHSABS 0.5 (L) 10/28/2020 1140   MONOABS 1.1 (H) 10/28/2020 1140   EOSABS 0.0 10/28/2020 1140   BASOSABS 0.0 10/28/2020 1140   HEPATIC Function Panel Recent Labs    08/24/20 0919 10/26/20 0846 10/27/20 0023  PROT 6.7 6.4* 5.7*   HEMOGLOBIN A1C No components found for: HGA1C,  MPG CARDIAC ENZYMES Lab Results  Component Value Date   CKTOTAL 735 (H) 02/01/2018  BNP No results for input(s): PROBNP in the last 8760 hours. TSH Recent Labs    01/19/20 1250 05/17/20 1734  TSH 2.243 1.710   CHOLESTEROL Recent Labs    02/08/20 0257  CHOL 202*    Scheduled Meds: . ALPRAZolam  0.25 mg Oral Q8H  . digoxin  0.125 mg Oral Q M,W,F  . gemfibrozil  600 mg Oral BID  . metoprolol tartrate  25 mg Oral BID  . nicotine  21 mg Transdermal Daily  . prazosin  2 mg Oral TID  . senna  1 tablet Oral BID  . sertraline  100 mg Oral QHS   Continuous Infusions: . cefTRIAXone (ROCEPHIN)  IV 1 g (11/02/20 0819)   PRN Meds:.acetaminophen, bisacodyl, diphenhydrAMINE-zinc acetate, oxyCODONE, oxymetazoline  Assessment/Plan: Chronic atrial fibrillation Abdominal pain Left adrenal  mass Pelvic mass CAD S/P CABG S/P bioprosthetic AV Retroperitoneal bleed Anemia of blood loss Anemia of chronic disease Acute kidney injury on CKD IV Tobacco use disorder  Plan Continue alpha blocker. Low dose Beta-blocker for heart rate control. Increase activity/Supervised walking.   LOS: 7 days   Time spent including chart review, lab review, examination, discussion with patient/Resident and nurse : 30 min   Dixie Dials  MD  11/02/2020, 9:04 AM

## 2020-11-03 LAB — CBC
HCT: 32.5 % — ABNORMAL LOW (ref 36.0–46.0)
Hemoglobin: 10.1 g/dL — ABNORMAL LOW (ref 12.0–15.0)
MCH: 30.3 pg (ref 26.0–34.0)
MCHC: 31.1 g/dL (ref 30.0–36.0)
MCV: 97.6 fL (ref 80.0–100.0)
Platelets: 261 10*3/uL (ref 150–400)
RBC: 3.33 MIL/uL — ABNORMAL LOW (ref 3.87–5.11)
RDW: 16.6 % — ABNORMAL HIGH (ref 11.5–15.5)
WBC: 9.4 10*3/uL (ref 4.0–10.5)
nRBC: 0 % (ref 0.0–0.2)

## 2020-11-03 LAB — DIGOXIN LEVEL: Digoxin Level: 1.1 ng/mL (ref 1.0–2.0)

## 2020-11-03 LAB — BASIC METABOLIC PANEL
Anion gap: 15 (ref 5–15)
BUN: 95 mg/dL — ABNORMAL HIGH (ref 8–23)
CO2: 17 mmol/L — ABNORMAL LOW (ref 22–32)
Calcium: 9.3 mg/dL (ref 8.9–10.3)
Chloride: 103 mmol/L (ref 98–111)
Creatinine, Ser: 5.01 mg/dL — ABNORMAL HIGH (ref 0.44–1.00)
GFR, Estimated: 9 mL/min — ABNORMAL LOW (ref >60)
Glucose, Bld: 103 mg/dL — ABNORMAL HIGH (ref 70–99)
Potassium: 4.2 mmol/L (ref 3.5–5.1)
Sodium: 135 mmol/L (ref 135–145)

## 2020-11-03 MED ORDER — STERILE WATER FOR INJECTION IV SOLN
INTRAVENOUS | Status: DC
  Filled 2020-11-03: qty 9.62

## 2020-11-03 MED ORDER — PRAZOSIN HCL 2 MG PO CAPS
4.0000 mg | ORAL_CAPSULE | Freq: Two times a day (BID) | ORAL | Status: DC
  Administered 2020-11-03 – 2020-11-06 (×6): 4 mg via ORAL
  Filled 2020-11-03 (×6): qty 2

## 2020-11-03 NOTE — Progress Notes (Signed)
This chaplain is present with the Pt., notary and 2 witnesses to notarize the Pt. Advance Directive:  HCPOA and Living Will.  The Pt. named Rodolph Bong as her Designer, television/film set.  The chaplain gave the original and one copy of the Pt. Advance Directive to the Pt. A copy was scanned into the Pt. EMR.    This chaplain is available for F/U spiritual care as needed.

## 2020-11-03 NOTE — Progress Notes (Signed)
   11/03/20 1948  Assess: MEWS Score  BP 138/79  Pulse Rate 80  ECG Heart Rate 84  Resp 16  Level of Consciousness Alert  SpO2 93 %  O2 Device Room Air  Patient Activity (if Appropriate) In bed  Assess: MEWS Score  MEWS Temp 0  MEWS Systolic 0  MEWS Pulse 0  MEWS RR 0  MEWS LOC 0  MEWS Score 0  MEWS Score Color Green  Treat  Pain Scale 0-10  Pain Score 7  Pain Type Chronic pain  Pain Location Back  Pain Orientation Lower  Pain Descriptors / Indicators Aching  Pain Frequency Intermittent  Pain Onset On-going  Pain Intervention(s) Medication (See eMAR)

## 2020-11-03 NOTE — Consult Note (Signed)
Ref: Dixie Dials, MD   Subjective:  Awake. No chest pain. HR in 70's.  Limited mobility. Abdominal pain and back pain are improving. Elevated BP post bicarb infusion.  This AM blood work is stable. Creatinine is 5.01 mg. Hgb 10.1 gm.  Objective:  Vital Signs in the last 24 hours: Temp:  [97.9 F (36.6 C)-98.3 F (36.8 C)] 98 F (36.7 C) (11/09 0752) Pulse Rate:  [72-80] 74 (11/09 0815) Cardiac Rhythm: Atrial fibrillation (11/09 0700) Resp:  [11-24] 19 (11/09 0752) BP: (125-145)/(71-104) 135/104 (11/09 0815) SpO2:  [94 %-95 %] 95 % (11/09 0752) Weight:  [74.4 kg] 74.4 kg (11/09 0035)  Physical Exam: BP Readings from Last 1 Encounters:  11/03/20 (!) 135/104     Wt Readings from Last 1 Encounters:  11/03/20 74.4 kg    Weight change: -0.71 kg Body mass index is 29.05 kg/m. HEENT: [place]/AT, Eyes-Blue, PERL, EOMI, Conjunctiva-Pink, Sclera-Non-icteric Neck: No JVD, No bruit, Trachea midline. Lungs:  Clear, Bilateral. Cardiac:  Irregular rhythm, normal S1 and S2, no S3. II/VI systolic murmur. Abdomen:  Soft, LUQ tender. BS present. Left upper back tender. Extremities:  No edema present. No cyanosis. No clubbing. CNS: AxOx3, Cranial nerves grossly intact, moves all 4 extremities.  Skin: Warm and dry.   Intake/Output from previous day: 11/08 0701 - 11/09 0700 In: 1150 [P.O.:400; I.V.:750] Out: 850 [Urine:850]    Lab Results: BMET    Component Value Date/Time   NA 135 11/03/2020 0529   NA 134 (L) 11/02/2020 0526   NA 136 11/01/2020 0313   K 4.2 11/03/2020 0529   K 4.4 11/02/2020 0526   K 4.6 11/01/2020 0313   CL 103 11/03/2020 0529   CL 104 11/02/2020 0526   CL 105 11/01/2020 0313   CO2 17 (L) 11/03/2020 0529   CO2 16 (L) 11/02/2020 0526   CO2 20 (L) 11/01/2020 0313   GLUCOSE 103 (H) 11/03/2020 0529   GLUCOSE 98 11/02/2020 0526   GLUCOSE 103 (H) 11/01/2020 0313   BUN 95 (H) 11/03/2020 0529   BUN 92 (H) 11/02/2020 0526   BUN 90 (H) 11/01/2020 0313   CREATININE  5.01 (H) 11/03/2020 0529   CREATININE 5.40 (H) 11/02/2020 0526   CREATININE 5.59 (H) 11/01/2020 0313   CALCIUM 9.3 11/03/2020 0529   CALCIUM 9.2 11/02/2020 0526   CALCIUM 9.0 11/01/2020 0313   GFRNONAA 9 (L) 11/03/2020 0529   GFRNONAA 8 (L) 11/02/2020 0526   GFRNONAA 8 (L) 11/01/2020 0313   GFRAA 34 (L) 08/24/2020 0919   GFRAA 29 (L) 07/19/2020 0920   GFRAA 32 (L) 07/13/2020 0604   CBC    Component Value Date/Time   WBC 9.4 11/03/2020 0529   RBC 3.33 (L) 11/03/2020 0529   HGB 10.1 (L) 11/03/2020 0529   HCT 32.5 (L) 11/03/2020 0529   PLT 261 11/03/2020 0529   MCV 97.6 11/03/2020 0529   MCH 30.3 11/03/2020 0529   MCHC 31.1 11/03/2020 0529   RDW 16.6 (H) 11/03/2020 0529   LYMPHSABS 0.5 (L) 10/28/2020 1140   MONOABS 1.1 (H) 10/28/2020 1140   EOSABS 0.0 10/28/2020 1140   BASOSABS 0.0 10/28/2020 1140   HEPATIC Function Panel Recent Labs    08/24/20 0919 10/26/20 0846 10/27/20 0023  PROT 6.7 6.4* 5.7*   HEMOGLOBIN A1C No components found for: HGA1C,  MPG CARDIAC ENZYMES Lab Results  Component Value Date   UEAVWUJ 811 (H) 02/01/2018   BNP No results for input(s): PROBNP in the last 8760 hours. TSH Recent Labs  01/19/20 1250 05/17/20 1734  TSH 2.243 1.710   CHOLESTEROL Recent Labs    02/08/20 0257  CHOL 202*    Scheduled Meds: . ALPRAZolam  0.25 mg Oral Q8H  . digoxin  0.125 mg Oral Q M,W,F  . gemfibrozil  600 mg Oral BID  . metoprolol tartrate  25 mg Oral BID  . nicotine  21 mg Transdermal Daily  . prazosin  4 mg Oral BID  . senna  1 tablet Oral BID  . sertraline  100 mg Oral QHS   Continuous Infusions: PRN Meds:.acetaminophen, bisacodyl, diphenhydrAMINE-zinc acetate, oxyCODONE, oxymetazoline  Assessment/Plan: Chronic atrial fibrillation Abdominal pain Left adrenal mass Pelvic mass CAD cABG S/P bioprosthetic AV HTN Retroperitoneal bleed, stable Anemia of blood loss, improving Anemia of chronic disease Acute kidney injury on CKD  IV Tobacco use disorder  Plan Increase alpha-blocker Prazosin dose to 4 mg. BID, If BP remains high increase to 4 mg. TID, then consider amlodipine 2.5 mg. or 5 mg. If needed. Increase activity.   LOS: 8 days   Time spent including chart review, lab review, examination, discussion with patient/nurse : 30 min   Dixie Dials  MD  11/03/2020, 9:07 AM

## 2020-11-03 NOTE — Progress Notes (Signed)
Admit: 10/26/2020 LOS: 8  Renee Griffin is a/an 64 y.o. female with a past medical history significant for CHF, atrial fibrillation, CAD status post CABG, aortic stenosis status post valve replacement, CVA, HTN, HLD, COPD, CKD, tobacco use disorder, adrenal mass possibly pheochromocytoma, admitted for left adrenal mass hemorrhage.     Subjective:  . No acute events overnight. Endorses breathing worse this morning and continues to have left abdominal pain.  She reports being frustrated at not getting any help with her ADL's today.  11/08 0701 - 11/09 0700 In: 1150 [P.O.:400; I.V.:750] Out: 850 [Urine:850]  Filed Weights   11/01/20 0345 11/02/20 0500 11/03/20 0035  Weight: 75 kg 75.1 kg 74.4 kg    Scheduled Meds: . ALPRAZolam  0.25 mg Oral Q8H  . digoxin  0.125 mg Oral Q M,W,F  . gemfibrozil  600 mg Oral BID  . metoprolol tartrate  25 mg Oral BID  . nicotine  21 mg Transdermal Daily  . prazosin  2 mg Oral TID  . senna  1 tablet Oral BID  . sertraline  100 mg Oral QHS   Continuous Infusions: . cefTRIAXone (ROCEPHIN)  IV 1 g (11/02/20 0819)  . sodium bicarbonate in 1/4 NS 1000 mL infusion 75 mL/hr at 11/02/20 1438   PRN Meds:. Current Labs: reviewed BUN 95, Cr 5.01, Hbg 10.1, CO2 17   Physical Exam:  Blood pressure 125/83, pulse 75, temperature 97.9 F (36.6 C), temperature source Oral, resp. rate 11, height 5\' 3"  (1.6 m), weight 74.4 kg, SpO2 94 %.  GEN: wdwn, sitting in bed, nad ENT: no nasal discharge, mmm EYES: no scleral icterus, eomi CV: normal rate, no murmurs PULM: no iwob, bilateral chest rise ABD: NABS, non-distended, left abdominal tenderness SKIN: no rashes or jaundice EXT: no edema, warm and well perfused   A/P  Non oliguric AKI/CKD stage 3-4 Improving. Likely secondary to ischemic ATN in the setting of hemorrhagic shock and hypotension. No hydronephrosis seen on CT scan but there was a non obstructing stone seen in the left kidney.  Previous history of  taking NSAIDs prior to hospitalization.  -24hr Urine output 850cc  -Slight improvement in Creatine overnight after IV hydration.   -Would consider continuing sodium bicarb drip for another 24hrs and repeat labs in am. -Monitor Cr daily -Continue to monitor urine output, strict intake and output -Avoid nephrotoxic agents -Maintain BP to MAP >65 -Palliative care following will continue conversations regarding dialysis and goals of care  Hemorrhagic Shock Normotensive and Hbg improving.   Continue current management and will follow Transfuse <8 given CAD  Large Left Neoplasm with recurrent bleed Likely Pheochromocytoma given elevated metanepherines and refractory HTN.  -Urology following and recommends adrenalectomy in 6-12 weeks  Refractory HTN Likely secondary to adrenal mass BP control with alpha blockers  Afib Was on Eliquis.  Holding now secondary to bleed Continue to hold anticoagulation for now, primary team managing  Bilateral Renal Cysts Urology following.  Concern for low grade neoplasm.  Combined CHF Cardiology following  UTI Likely contributing to AKI Continue Ceftriaxone 1gm q24hr for total of 5 day course (11/06-11/10) Monitor for recurrent signs of infection.  Carollee Leitz, MD Family Medicine Residency  11/03/2020, 7:04 AM  Recent Labs  Lab 11/01/20 0313 11/02/20 0526 11/03/20 0529  NA 136 134* 135  K 4.6 4.4 4.2  CL 105 104 103  CO2 20* 16* 17*  GLUCOSE 103* 98 103*  BUN 90* 92* 95*  CREATININE 5.59* 5.40* 5.01*  CALCIUM 9.0  9.2 9.3   Recent Labs  Lab 10/28/20 1140 10/28/20 1552 11/01/20 0313 11/02/20 0526 11/03/20 0529  WBC 17.6*   < > 8.4 8.9 9.4  NEUTROABS 15.8*  --   --   --   --   HGB 8.9*   < > 9.6* 10.0* 10.1*  HCT 28.5*   < > 30.3* 31.9* 32.5*  MCV 97.3   < > 97.7 97.9 97.6  PLT 177   < > 213 257 261   < > = values in this interval not displayed.    Plan communicated to the primary team

## 2020-11-03 NOTE — Progress Notes (Signed)
Subjective:  [name] is a 64 year old female with past medical history of chronic systolic and diastolic heart failure, chronic atrial fibrillation (on Eliquis,) CAD s/p CABG, aortic stenosis s/p bioprosthetic aortic valve, prior CVA of left cerebellum and pons, HTN, HLD, COPD, CKD IV, anxiety, and current tobacco use disorder who presented to [facility] on 10/26/2020 for evaluation of left flank pain found to have enlargement of known left adrenal mass concerning for pheochromocytoma with hemorrhage into neoplasm and retroperitoneum.  Overnight, no acute events.  This morning, patient reports that she feels well. She states that her left-sided abdominal/flank pain continues to improve and is only mildly sore this morning. Otherwise, she denies any new complaints. She understands and agrees with our current plan and eventual goal to discharge to a skilled nursing facility. She has no further questions or concerns.  Objective:  Vital signs in last 24 hours: Vitals:   11/03/20 0035 11/03/20 0400 11/03/20 0752 11/03/20 0815  BP:  125/83 140/84 (!) 135/104  Pulse:  75 72 74  Resp:  11 19   Temp:  97.9 F (36.6 C) 98 F (36.7 C)   TempSrc:  Oral Oral   SpO2:  94% 95%   Weight: 74.4 kg     Height:      On room air  Intake/Output Summary (Last 24 hours) at 11/03/2020 1008 Last data filed at 11/03/2020 0846 Gross per 24 hour  Intake 1050 ml  Output 850 ml  Net 200 ml   Filed Weights   11/01/20 0345 11/02/20 0500 11/03/20 0035  Weight: 75 kg 75.1 kg 74.4 kg   Physical Exam Vitals and nursing note reviewed. Exam conducted with a chaperone present.  Constitutional:      General: She is not in acute distress.    Appearance: She is not toxic-appearing.  HENT:     Head: Normocephalic and atraumatic.  Eyes:     Extraocular Movements: Extraocular movements intact.     Conjunctiva/sclera: Conjunctivae normal.  Cardiovascular:     Rate and Rhythm: Normal rate. Rhythm irregular.    Pulmonary:     Effort: No respiratory distress.     Breath sounds: Normal breath sounds.  Abdominal:     General: Abdomen is flat. Bowel sounds are normal.     Palpations: Abdomen is soft.     Tenderness: There is abdominal tenderness.     Comments: Mildly tender to deep palpation of left middle and upper abdomen  Musculoskeletal:        General: Normal range of motion.     Cervical back: Normal range of motion and neck supple.     Right lower leg: No edema.     Left lower leg: No edema.  Skin:    General: Skin is warm and dry.     Capillary Refill: Capillary refill takes less than 2 seconds.  Neurological:     General: No focal deficit present.     Mental Status: She is alert. Mental status is at baseline.  Psychiatric:        Mood and Affect: Mood normal.        Behavior: Behavior normal.        Thought Content: Thought content normal.        Judgment: Judgment normal.    CBC Latest Ref Rng & Units 11/03/2020 11/02/2020 11/01/2020  WBC 4.0 - 10.5 K/uL 9.4 8.9 8.4  Hemoglobin 12.0 - 15.0 g/dL 10.1(L) 10.0(L) 9.6(L)  Hematocrit 36 - 46 % 32.5(L)  31.9(L) 30.3(L)  Platelets 150 - 400 K/uL 261 257 213   CMP Latest Ref Rng & Units 11/03/2020 11/02/2020 11/01/2020  Glucose 70 - 99 mg/dL 103(H) 98 103(H)  BUN 8 - 23 mg/dL 95(H) 92(H) 90(H)  Creatinine 0.44 - 1.00 mg/dL 5.01(H) 5.40(H) 5.59(H)  Sodium 135 - 145 mmol/L 135 134(L) 136  Potassium 3.5 - 5.1 mmol/L 4.2 4.4 4.6  Chloride 98 - 111 mmol/L 103 104 105  CO2 22 - 32 mmol/L 17(L) 16(L) 20(L)  Calcium 8.9 - 10.3 mg/dL 9.3 9.2 9.0  Total Protein 6.5 - 8.1 g/dL - - -  Total Bilirubin 0.3 - 1.2 mg/dL - - -  Alkaline Phos 38 - 126 U/L - - -  AST 15 - 41 U/L - - -  ALT 0 - 44 U/L - - -   Cortisol - 18.4 Urine sodium - 74 Creatinine, Urine - 358.14 Total protein, urine - 302 Protein / creatinine ratio - 0.84 UA - Large leukocytes, >50 WBC, many bacteria Urine culture - multiple species present, suggest  recollection Metanephrines, plasma: - Normetanephrine 346.7 (reference range 0.0 - 285.2) - Free metanephrine 67.0 (reference range 0.0-88.0) Digoxin level - 1.1  IMAGING: No results found.   Assessment/Plan:  [name] is a 64 year old female with past medical history of chronic systolic and diastolic heart failure, chronic atrial fibrillation (on Eliquis,) CAD s/p CABG, aortic stenosis s/p bioprosthetic aortic valve, prior CVA of left cerebellum and pons, HTN, HLD, COPD, CKD IV, anxiety, and current tobacco use disorder who presented to [facility] on 10/26/2020 for evaluation of left flank pain found to have enlargement of known left adrenal mass concerning for pheochromocytoma with hemorrhage into neoplasm and retroperitoneum.  Principal Problem:   Retroperitoneal bleed Active Problems:   HTN (hypertension)   Dyslipidemia   Anxiety   Ovarian mass, left   Atrial fibrillation with RVR (HCC)   Adrenal mass greater than 4 cm in diameter (HCC)   Acute blood loss anemia   CKD (chronic kidney disease) stage 4, GFR 15-29 ml/min (HCC)   Palliative care by specialist   Goals of care, counseling/discussion   DNR (do not resuscitate)   AKI (acute kidney injury) ([place])   Hemorrhagic shock ([place])  #Hemmorhagic shock 2/2 left adrenal mass hemorrhage, resolved Hemoglobin continues to improve, most recently 10.1, suggesting resolution of her bleed. We will continue to monitor her condition closely and hold her home anticoagulation. -Daily CBC, close attention to Hb -Holding home Eliquis -Continue 5mg  oxycodone q6h as needed for left flank pain (pain significantly improving)  #Left adrenal mass concerning for pheochromocytoma, active Patient with large left adrenal mass, initially diagnosed in 2019 and initial workup revealed elevated normetanephrines. Patient did not pursue further workup at that time. Repeat metanephrines during this admission revealing mild improvement, but persistent  elevation. Most concerning for pheochromocytoma. Urology initiated treatment with prazosin 2mg  three times daily with plan to undergo left adrenalectomy in 6 to 12 weeks. Oncology evaluated patient and reported that there is no role for adjuvant systemic therapies for this condition. As patient continues to improve and is hemodynamically stable, we will uptitrate patient's prazosin. -Increase prazosin from 2mg  three times daily to 4mg  twice daily -Appreciate urology recommendations -Left adrenalectomy in 6-12 weeks from admission  #Non-oliguric AKI in setting of CKD3-4, active Patient developed acute tubular necrosis secondary to hemorrhagic shock in the setting of her bleeding adrenal mass. Renal function worsened from 1.9 on arrival to 5.60 two days ago. Creatinine function  slowly improving, most recently 5.01. She continues to have appropriate urine output with 850cc out in the past 24 hours. Nephrology following with no plan for urgent dialysis at this point. -Daily BMP, monitor creatinine -strict I/O -Appreciate nephrology recommendations  #Urinary tract infection, active Patient's UA revealing negative nitrites, large leukocytes, >50 WBC, many bacteria, WBC clumps.  -Final day of ceftriaxone today  #Atrial fibrillation, chronic Patient's heart rate well controlled in the 70s throughout the past 24 hours. -Continue metoprolol 25mg  twice daily -Holding home diltiazem -Holding home apixaban  #Large pelvic cyst, active #Cystic areas in bilateral kidneys, active Patient noted to have 10 x 7cm cystic lesion in the pelvis on CT scan concerning low-grade cystic neoplasm as well as multiple cystic areas in bilateral kidneys. Similar findings noted in initial CT Abdomen/Pelvis in February of 2019. Although further workup of these lesions is warranted, patient's current acute bleed is the primary focus at this time. -Further workup pending stabilization of acute bleed  -GYN consult  -MRI pelvis  with and without contrast if clinically warranted  -Consider follow-up renal sonogram or multiphase CT when the patient is able  #Hypertension, chronic Patient slightly hypertensive with continued prazosin use. -Increase prazosin from 2mg  three times daily to 4mg  twice daily -Metoprolol 25mg  twice daily -Holding home diltiazem  #HFpEF, chronic Patient echo reveals EF of 50-55%. Patient does not appear volume overloaded on examination. Patient currently on room air. -Discontinued home torsemide 20mg  Monday, Wednesday and Friday -Continue digoxin 0.125mg  Monday, Wednesday and Friday -Dr. Doylene Canard with cardiology is following  #HLD, chronic -Continue home gemfibrozil  #Anxiety, chronic -Continue sertraline and xanax  #Tobacco Use Disorder, chronic  -Nicotine patch  Code status: DNR VTE ppx: SCDs Bowel regimen: Senna twice daily Diet: Renal with fluid restriction PT/OT recs: SNF  Foy [place], MD Internal Medicine Resident PGY-2 [facility] Pager: [phone] After 5pm on weekdays and 1pm on weekends: On Call pager [phone] 11/03/2020 10:08 AM

## 2020-11-04 LAB — BASIC METABOLIC PANEL
Anion gap: 15 (ref 5–15)
BUN: 95 mg/dL — ABNORMAL HIGH (ref 8–23)
CO2: 18 mmol/L — ABNORMAL LOW (ref 22–32)
Calcium: 9.4 mg/dL (ref 8.9–10.3)
Chloride: 102 mmol/L (ref 98–111)
Creatinine, Ser: 4.64 mg/dL — ABNORMAL HIGH (ref 0.44–1.00)
GFR, Estimated: 10 mL/min — ABNORMAL LOW (ref >60)
Glucose, Bld: 121 mg/dL — ABNORMAL HIGH (ref 70–99)
Potassium: 3.9 mmol/L (ref 3.5–5.1)
Sodium: 135 mmol/L (ref 135–145)

## 2020-11-04 LAB — CBC
HCT: 31.8 % — ABNORMAL LOW (ref 36.0–46.0)
Hemoglobin: 10 g/dL — ABNORMAL LOW (ref 12.0–15.0)
MCH: 30.2 pg (ref 26.0–34.0)
MCHC: 31.4 g/dL (ref 30.0–36.0)
MCV: 96.1 fL (ref 80.0–100.0)
Platelets: 308 10*3/uL (ref 150–400)
RBC: 3.31 MIL/uL — ABNORMAL LOW (ref 3.87–5.11)
RDW: 16.4 % — ABNORMAL HIGH (ref 11.5–15.5)
WBC: 10.5 10*3/uL (ref 4.0–10.5)
nRBC: 0 % (ref 0.0–0.2)

## 2020-11-04 MED ORDER — STERILE WATER FOR INJECTION IV SOLN
INTRAVENOUS | Status: AC
  Filled 2020-11-04 (×2): qty 9.62

## 2020-11-04 NOTE — Progress Notes (Signed)
Admit: 10/26/2020 LOS: 9  Renee Griffin is a/an 64 y.o. female with a past medical history significant for CHF, atrial fibrillation, CAD status post CABG, aortic stenosis status post valve replacement, CVA, HTN, HLD, COPD, CKD, tobacco use disorder, adrenal mass possibly pheochromocytoma, admitted for left adrenal mass hemorrhage.     Subjective:  . No acute events overnight.  Reports breathing not improving and having back pain.  Reports not getting out of bed much.  Denies any chest pain, SOB, nausea, vomiting, or diarrhea.  Voiding well without hematuria.  11/09 0701 - 11/10 0700 In: 2094 [P.O.:600; I.V.:675] Out: 1050 [Urine:1050]  Filed Weights   11/02/20 0500 11/03/20 0035 11/04/20 0338  Weight: 75.1 kg 74.4 kg 76.1 kg    Scheduled Meds: . ALPRAZolam  0.25 mg Oral Q8H  . digoxin  0.125 mg Oral Q M,W,F  . gemfibrozil  600 mg Oral BID  . metoprolol tartrate  25 mg Oral BID  . nicotine  21 mg Transdermal Daily  . prazosin  4 mg Oral BID  . senna  1 tablet Oral BID  . sertraline  100 mg Oral QHS   Continuous Infusions: . sodium bicarbonate in 1/4 NS 1000 mL infusion 75 mL/hr at 11/03/20 1125   PRN Meds:. Current Labs: reviewed BUN 95, Cr 4.64, Hbg 10.0, CO2 18   Physical Exam:  Blood pressure (!) 133/91, pulse 64, temperature 97.9 F (36.6 C), temperature source Oral, resp. rate 17, height 5\' 3"  (1.6 m), weight 76.1 kg, SpO2 93 %.   GEN: wdwn, sitting in bed, nad ENT: no nasal discharge, mmm EYES: no scleral icterus, eomi CV: normal rate,irregular rhythm, no murmurs PULM: no iwob, bilateral chest rise ABD: NABS, non-distended, left abdominal tenderness SKIN: no rashes or jaundice EXT: no edema, warm and well perfused   A/P  Non oliguric AKI/CKD stage 3-4 Improving. Likely secondary to ischemic ATN in the setting of hemorrhagic shock and hypotension. No hydronephrosis seen on CT scan but there was a non obstructing stone seen in the left kidney.  Previous history of  taking NSAIDs prior to hospitalization.  -24hr Urine output 1050cc  -Creatine improving with IV hydration -Would consider continuing sodium bicarb drip for another 24-48hrs and repeat labs in am. -Monitor Cr daily -Continue to monitor urine output, strict intake and output -Avoid nephrotoxic agents -Maintain BP to MAP >65 -Palliative care following will continue conversations regarding dialysis and goals of care  Hemorrhagic Shock Normotensive and Hbg improving.   Continue current management and will follow Transfuse <8 given CAD  Large Left Neoplasm with recurrent bleed Likely Pheochromocytoma given elevated metanepherines and refractory HTN.  -Urology following and recommends adrenalectomy in 6-12 weeks  Refractory HTN Likely secondary to adrenal mass BP control with alpha blockers  Afib Was on Eliquis.  Holding now secondary to bleed Continue to hold anticoagulation for now, primary team managing  Bilateral Renal Cysts Urology following.  Concern for low grade neoplasm.  Combined CHF Cardiology following  UTI Likely contributing to AKI Continue Ceftriaxone 1gm q24hr for total of 5 day course (11/06-11/10) Monitor for recurrent signs of infection.  Carollee Leitz, MD Family Medicine Residency  11/04/2020, 6:47 AM  Recent Labs  Lab 11/02/20 0526 11/03/20 0529 11/04/20 0359  NA 134* 135 135  K 4.4 4.2 3.9  CL 104 103 102  CO2 16* 17* 18*  GLUCOSE 98 103* 121*  BUN 92* 95* 95*  CREATININE 5.40* 5.01* 4.64*  CALCIUM 9.2 9.3 9.4   Recent Labs  Lab 10/28/20 1140 10/28/20 1552 11/02/20 0526 11/03/20 0529 11/04/20 0359  WBC 17.6*   < > 8.9 9.4 10.5  NEUTROABS 15.8*  --   --   --   --   HGB 8.9*   < > 10.0* 10.1* 10.0*  HCT 28.5*   < > 31.9* 32.5* 31.8*  MCV 97.3   < > 97.9 97.6 96.1  PLT 177   < > 257 261 308   < > = values in this interval not displayed.    Plan communicated to the primary team

## 2020-11-04 NOTE — TOC Initial Note (Signed)
Transition of Care [facility]) - Initial/Assessment Note    Patient Details  Name: Renee Griffin MRN: [id number] Date of Birth: 1956/02/27  Transition of Care [facility]) CM/SW Contact:    [address], [place] Phone Number: 11/04/2020, 3:41 PM  Clinical Narrative:                 CSW received consult for possible SNF placement at time of discharge. CSW spoke with patient regarding PT recommendation of SNF placement at time of discharge. Patient reported that she lives alone and is currently unable to care for herself given patient's current physical needs and fall risk. Patient expressed understanding of PT recommendation and is agreeable to SNF placement at time of discharge. CSW discussed insurance authorization process and provided Medicare SNF ratings list. Patient has received the COVID vaccines. Patient expressed being hopeful for rehab and to feel better soon. Patient gave CSW permission to reach out to her brother, Remo Lipps to provide updates, CSW tried to call Remo Lipps, no answer, had to leave a vm. No further questions reported at this time. CSW to continue to follow and assist with discharge planning needs.  Expected Discharge Plan: Skilled Nursing Facility Barriers to Discharge: Continued Medical Work up, Ship broker   Patient Goals and CMS Choice Patient states their goals for this hospitalization and ongoing recovery are:: Get stronger CMS Medicare.gov Compare Post Acute Care list provided to:: Patient Choice offered to / list presented to : Patient  Expected Discharge Plan and Services Expected Discharge Plan: [place] In-house Referral: Clinical Social Work     Living arrangements for the past 2 months: Single Family Home                 DME Arranged: Environmental consultant tall                    Prior Living Arrangements/Services Living arrangements for the past 2 months: Single Family Home Lives with:: Self Patient language and need for interpreter  reviewed:: Yes Do you feel safe going back to the place where you live?: Yes      Need for Family Participation in Patient Care: Yes (Comment) Care giver support system in place?: Yes (comment)   Criminal Activity/Legal Involvement Pertinent to Current Situation/Hospitalization: No - Comment as needed  Activities of Daily Living Home Assistive Devices/Equipment: Cane (specify quad or straight) ADL Screening (condition at time of admission) Patient's cognitive ability adequate to safely complete daily activities?: Yes Is the patient deaf or have difficulty hearing?: No Does the patient have difficulty seeing, even when wearing glasses/contacts?: No Does the patient have difficulty concentrating, remembering, or making decisions?: Yes Patient able to express need for assistance with ADLs?: Yes Does the patient have difficulty dressing or bathing?: No Independently performs ADLs?: Yes (appropriate for developmental age) Does the patient have difficulty walking or climbing stairs?: Yes Weakness of Legs: Both Weakness of Arms/Hands: None  Permission Sought/Granted Permission sought to share information with : Case Manager, Family Supports, Customer service manager Permission granted to share information with : Yes, Verbal Permission Granted  Share Information with NAME: Darius Bump  Permission granted to share info w AGENCY: SNFs  Permission granted to share info w Relationship: Brother  Permission granted to share info w Contact Information: [phone]  Emotional Assessment Appearance:: Appears stated age Attitude/Demeanor/Rapport: Gracious Affect (typically observed): Calm Orientation: : Oriented to Self, Oriented to Place, Oriented to  Time, Oriented to Situation Alcohol / Substance Use: Not Applicable Psych Involvement:  No (comment)  Admission diagnosis:  Retroperitoneal bleed [R58] Retroperitoneal bleeding [R58] Patient Active Problem List   Diagnosis Date Noted   . Hemorrhagic shock ([place]) 11/01/2020  . Palliative care by specialist   . Goals of care, counseling/discussion   . DNR (do not resuscitate)   . AKI (acute kidney injury) ([place])   . Adrenal mass greater than 4 cm in diameter ([place]) 10/27/2020  . Acute blood loss anemia 10/27/2020  . CKD (chronic kidney disease) stage 4, GFR 15-29 ml/min (HCC) 10/27/2020  . Retroperitoneal bleed 10/26/2020  . Acute exacerbation of chronic obstructive pulmonary disease (COPD) ([place]) 06/23/2020  . Atrial fibrillation with RVR ([place]) 05/23/2020  . Acute congestive heart failure ([place]) 05/17/2020  . NSTEMI (non-ST elevated myocardial infarction) ([place]) 02/07/2020  . Acute systolic heart failure ([place]) 01/19/2020  . Acute lower GI bleeding 12/16/2018  . Dehydration 04/13/2018  . Ovarian mass, left   . Abdominal pain 02/01/2018  . Essential hypertension 03/04/2016  . HLD (hyperlipidemia) 03/04/2016  . Type 2 diabetes mellitus with circulatory disorder ([place]) 03/04/2016  . Obesity 03/04/2016  . CVA (cerebral vascular accident) ([place]) 03/05/2015  . Bilateral low back pain without sciatica 03/05/2015  . Essential hypertension, benign 03/05/2015  . Tobacco use disorder 03/05/2015  . Hyperlipidemia 03/05/2015  . Hyperparathyroidism, primary ([place]) 02/13/2013  . Generalized ischemic cerebrovascular disease 02/06/2013  . Hyperreflexia 02/06/2013  . Abnormality of gait 02/06/2013  . Disturbance of skin sensation 02/06/2013  . CVA (cerebral infarction) 06/03/2012  . HTN (hypertension) 06/03/2012  . Dyslipidemia 06/03/2012  . Anxiety 06/03/2012   PCP:  Dixie Dials, MD Pharmacy:   [id number] - [place], [place]. [address] [place] [address] Phone: [phone] Fax: [phone]  [place], [place] [address] [place] [address] Phone: [phone] Fax:  [phone]     Social Determinants of Health (SDOH) Interventions    Readmission Risk Interventions Readmission Risk Prevention Plan 05/21/2020 02/13/2020  Transportation Screening Complete Complete  PCP or Specialist Appt within 3-5 Days Complete Complete  HRI or [place] Complete Complete  Social Work Consult for [place] Planning/Counseling Complete Complete  Palliative Care Screening Not Applicable Not Applicable  Medication Review Press photographer) Complete Complete  Some recent data might be hidden

## 2020-11-04 NOTE — Plan of Care (Signed)
Pt continuing to progress and verbalize needs without anxiety.

## 2020-11-04 NOTE — Progress Notes (Addendum)
Subjective:  [name]. Renee Griffin is a 64 year old female with past medical history of chronic systolic and diastolic heart failure, chronic atrial fibrillation (on Eliquis,) CAD s/p CABG, aortic stenosis s/p bioprosthetic aortic valve, prior CVA of left cerebellum and pons, HTN, HLD, COPD, CKD IV, anxiety, and current tobacco use disorder who presented to [facility] on 10/26/2020 for evaluation of left flank pain found to have enlargement of known left adrenal mass concerning for pheochromocytoma with hemorrhage into neoplasm and retroperitoneum.  Overnight, no acute events.  This morning, patient reports  That she feels well and has a good appetite. She states her only complaint is right-sided, lower back pain. She reports chronic back pain at baseline, however this is typically left-sided, as well as mild shortness of breath. Otherwise, she denies any new or worsening complaints.  Objective:  Vital signs in last 24 hours: Vitals:   11/03/20 1201 11/03/20 1948 11/03/20 2329 11/04/20 0338  BP: 134/90 138/79 125/76 (!) 133/91  Pulse: (!) 53 80 72 64  Resp: 16 16 16 17   Temp: 98.4 F (36.9 C)   97.9 F (36.6 C)  TempSrc: Oral   Oral  SpO2: 94% 93% 95% 93%  Weight:    76.1 kg  Height:      On room air  Intake/Output Summary (Last 24 hours) at 11/04/2020 0705 Last data filed at 11/04/2020 0339 Gross per 24 hour  Intake 1275 ml  Output 1050 ml  Net 225 ml   Filed Weights   11/02/20 0500 11/03/20 0035 11/04/20 0338  Weight: 75.1 kg 74.4 kg 76.1 kg   Physical Exam Vitals and nursing note reviewed. Exam conducted with a chaperone present.  Constitutional:      General: She is not in acute distress.    Appearance: She is not toxic-appearing.  HENT:     Head: Normocephalic and atraumatic.  Eyes:     Extraocular Movements: Extraocular movements intact.     Conjunctiva/sclera: Conjunctivae normal.  Cardiovascular:     Rate and Rhythm: Normal rate. Rhythm irregular.  Pulmonary:      Effort: No respiratory distress.     Breath sounds: Normal breath sounds.  Abdominal:     General: Abdomen is flat. Bowel sounds are normal.     Palpations: Abdomen is soft.     Tenderness: There is no abdominal tenderness.  Musculoskeletal:        General: Normal range of motion.     Cervical back: Normal range of motion and neck supple.     Right lower leg: No edema.     Left lower leg: No edema.  Skin:    General: Skin is warm and dry.     Capillary Refill: Capillary refill takes less than 2 seconds.  Neurological:     General: No focal deficit present.     Mental Status: She is alert. Mental status is at baseline.  Psychiatric:        Mood and Affect: Mood normal.        Behavior: Behavior normal.        Thought Content: Thought content normal.        Judgment: Judgment normal.    CBC Latest Ref Rng & Units 11/04/2020 11/03/2020 11/02/2020  WBC 4.0 - 10.5 K/uL 10.5 9.4 8.9  Hemoglobin 12.0 - 15.0 g/dL 10.0(L) 10.1(L) 10.0(L)  Hematocrit 36 - 46 % 31.8(L) 32.5(L) 31.9(L)  Platelets 150 - 400 K/uL 308 261 257   CMP Latest Ref Rng & Units 11/04/2020  11/03/2020 11/02/2020  Glucose 70 - 99 mg/dL 121(H) 103(H) 98  BUN 8 - 23 mg/dL 95(H) 95(H) 92(H)  Creatinine 0.44 - 1.00 mg/dL 4.64(H) 5.01(H) 5.40(H)  Sodium 135 - 145 mmol/L 135 135 134(L)  Potassium 3.5 - 5.1 mmol/L 3.9 4.2 4.4  Chloride 98 - 111 mmol/L 102 103 104  CO2 22 - 32 mmol/L 18(L) 17(L) 16(L)  Calcium 8.9 - 10.3 mg/dL 9.4 9.3 9.2  Total Protein 6.5 - 8.1 g/dL - - -  Total Bilirubin 0.3 - 1.2 mg/dL - - -  Alkaline Phos 38 - 126 U/L - - -  AST 15 - 41 U/L - - -  ALT 0 - 44 U/L - - -   Cortisol - 18.4 Urine sodium - 74 Creatinine, Urine - 358.14 Total protein, urine - 302 Protein / creatinine ratio - 0.84 UA - Large leukocytes, >50 WBC, many bacteria Urine culture - multiple species present, suggest recollection Metanephrines, plasma: - Normetanephrine 346.7 (reference range 0.0 - 285.2) - Free metanephrine  67.0 (reference range 0.0-88.0) Digoxin level - 1.1  IMAGING: No results found.   Assessment/Plan:  Principal Problem:   Retroperitoneal bleed Active Problems:   HTN (hypertension)   Dyslipidemia   Anxiety   Ovarian mass, left   Atrial fibrillation with RVR (HCC)   Adrenal mass greater than 4 cm in diameter (HCC)   Acute blood loss anemia   CKD (chronic kidney disease) stage 4, GFR 15-29 ml/min (HCC)   Palliative care by specialist   Goals of care, counseling/discussion   DNR (do not resuscitate)   AKI (acute kidney injury) ([place])   Hemorrhagic shock ([place])  [name]. Burleigh is a 64 year old female with past medical history of chronic systolic and diastolic heart failure, chronic atrial fibrillation (on Eliquis,) CAD s/p CABG, aortic stenosis s/p bioprosthetic aortic valve, prior CVA of left cerebellum and pons, HTN, HLD, COPD, CKD IV, anxiety, and current tobacco use disorder who presented to [facility] on 10/26/2020 for evaluation of left flank pain found to have enlargement of known left adrenal mass concerning for pheochromocytoma with hemorrhage into neoplasm and retroperitoneum.  #Hemmorhagic shock 2/2 left adrenal mass hemorrhage, resolved Hemoglobin stable for several days, now, suggesting resolution of her bleed. We will continue to monitor her condition closely and hold her home anticoagulation. -Daily CBC, close attention to Hb -Holding home Eliquis -Continue 5mg  oxycodone q6h as needed for left flank pain (pain significantly improving)  #Left adrenal mass concerning for pheochromocytoma, active Patient with large left adrenal mass, initially diagnosed in 2019 and initial workup revealed elevated normetanephrines. Patient did not pursue further workup at that time. Repeat metanephrines during this admission revealing mild improvement, but persistent elevation. Most concerning for pheochromocytoma. Urology initiated treatment with prazosin 2mg  three times daily with plan to  undergo left adrenalectomy in 6 to 12 weeks. Oncology evaluated patient and reported that there is no role for adjuvant systemic therapies for this condition. Uptitrated prazosin from 2mg  three times daily to 4mg  twice daily yesterday without complication. -Continue prazosin 4mg  twice daily -Appreciate urology recommendations -Left adrenalectomy in 6-12 weeks from admission  #Non-oliguric AKI in setting of EAV4-0, active #Metabolic acidosis, active Patient developed acute tubular necrosis secondary to hemorrhagic shock in the setting of her bleeding adrenal mass. Renal function worsened from 1.9 on arrival to 5.60. Creatinine function slowly improving, most recently 4.64, 5.01, 5.40 . She continues to have appropriate urine output with 1.050cc out in the past 24 hours. Nephrology following with no  plan for urgent dialysis at this point. Bicarbonate today 18 improved from 17, 16 while receiving sodium bicarbonate infusion. -Daily BMP, monitor creatinine -strict I/O -Appreciate nephrology recommendations  -Sodium bicarbonate infusion for another 24 hours in setting of associated metabolic acidosis   #Atrial fibrillation, chronic Patient's heart rate well controlled throughout the past 24 hours. -Continue metoprolol 25mg  twice daily -Holding home diltiazem -Holding home apixaban  #Large pelvic cyst, active #Cystic areas in bilateral kidneys, active Patient noted to have 10 x 7cm cystic lesion in the pelvis on CT scan concerning low-grade cystic neoplasm as well as multiple cystic areas in bilateral kidneys. Similar findings noted in initial CT Abdomen/Pelvis in February of 2019. Although further workup of these lesions is warranted, patient's current acute bleed is the primary focus at this time. -Further workup pending stabilization of acute bleed  -GYN consult  -MRI pelvis with and without contrast if clinically warranted  -Consider follow-up renal sonogram or multiphase CT when the patient  is able  #Hypertension, chronic Patient slightly hypertensive with continued prazosin use. -Continue prazosin 4mg  twice daily -Metoprolol 25mg  twice daily -Holding home diltiazem  #HFpEF, chronic Patient echo reveals EF of 50-55%. Patient does not appear volume overloaded on examination. Patient currently on room air. -Discontinued home torsemide 20mg  Monday, Wednesday and Friday -Continue digoxin 0.125mg  Monday, Wednesday and Friday -Dr. Doylene Canard with cardiology is following  #Urinary tract infection, resolved Patient's UA revealing negative nitrites, large leukocytes, >50 WBC, many bacteria, WBC clumps. Patient completed course of ceftriaxone on 11/09.  #HLD, chronic -Continue home gemfibrozil  #Anxiety, chronic -Continue sertraline and xanax  #Tobacco Use Disorder, chronic  -Nicotine patch  Code status: DNR VTE ppx: SCDs Bowel regimen: Senna twice daily Diet: Renal with fluid restriction PT/OT recs: SNF  Foy Guadalajara, MD Internal Medicine Resident PGY-2 [facility] Pager: [phone] After 5pm on weekdays and 1pm on weekends: On Call pager [phone] 11/04/2020 7:05 AM

## 2020-11-04 NOTE — Consult Note (Signed)
Ref: Dixie Dials, MD   Subjective:  Tolerating increased dose of alpha blocker and low dose Beta blocker. Patient is trying to increase activity but remains unsteady. She is willing to go to SNF until ready for surgery. VS stable.  Creatinine is trending downward at 4.64 mg. Hgb is stable.  Objective:  Vital Signs in the last 24 hours: Temp:  [97.5 F (36.4 C)-98.4 F (36.9 C)] 97.5 F (36.4 C) (11/10 0742) Pulse Rate:  [53-80] 72 (11/10 0742) Cardiac Rhythm: Atrial fibrillation (11/10 0727) Resp:  [16-19] 19 (11/10 0742) BP: (125-146)/(76-91) 146/81 (11/10 0742) SpO2:  [93 %-96 %] 96 % (11/10 0742) Weight:  [76.1 kg] 76.1 kg (11/10 0338)  Physical Exam: BP Readings from Last 1 Encounters:  11/04/20 (!) 146/81     Wt Readings from Last 1 Encounters:  11/04/20 76.1 kg    Weight change: 1.71 kg Body mass index is 29.72 kg/m. HEENT: [place]/AT, Eyes-Blue, Conjunctiva-Pale pink, Sclera-Non-icteric Neck: No JVD, No bruit, Trachea midline. Lungs:  Clear, Bilateral. Cardiac:  Irregular rhythm, normal S1 and S2, no S3. II/VI systolic murmur. Abdomen:  Soft, non-tender. BS present. Extremities:  No edema present. No cyanosis. No clubbing. CNS: AxOx3, Cranial nerves grossly intact, moves all 4 extremities.  Skin: Warm and dry.   Intake/Output from previous day: 11/09 0701 - 11/10 0700 In: 1610 [P.O.:600; I.V.:675] Out: 1050 [Urine:1050]    Lab Results: BMET    Component Value Date/Time   NA 135 11/04/2020 0359   NA 135 11/03/2020 0529   NA 134 (L) 11/02/2020 0526   K 3.9 11/04/2020 0359   K 4.2 11/03/2020 0529   K 4.4 11/02/2020 0526   CL 102 11/04/2020 0359   CL 103 11/03/2020 0529   CL 104 11/02/2020 0526   CO2 18 (L) 11/04/2020 0359   CO2 17 (L) 11/03/2020 0529   CO2 16 (L) 11/02/2020 0526   GLUCOSE 121 (H) 11/04/2020 0359   GLUCOSE 103 (H) 11/03/2020 0529   GLUCOSE 98 11/02/2020 0526   BUN 95 (H) 11/04/2020 0359   BUN 95 (H) 11/03/2020 0529   BUN 92 (H)  11/02/2020 0526   CREATININE 4.64 (H) 11/04/2020 0359   CREATININE 5.01 (H) 11/03/2020 0529   CREATININE 5.40 (H) 11/02/2020 0526   CALCIUM 9.4 11/04/2020 0359   CALCIUM 9.3 11/03/2020 0529   CALCIUM 9.2 11/02/2020 0526   GFRNONAA 10 (L) 11/04/2020 0359   GFRNONAA 9 (L) 11/03/2020 0529   GFRNONAA 8 (L) 11/02/2020 0526   GFRAA 34 (L) 08/24/2020 0919   GFRAA 29 (L) 07/19/2020 0920   GFRAA 32 (L) 07/13/2020 0604   CBC    Component Value Date/Time   WBC 10.5 11/04/2020 0359   RBC 3.31 (L) 11/04/2020 0359   HGB 10.0 (L) 11/04/2020 0359   HCT 31.8 (L) 11/04/2020 0359   PLT 308 11/04/2020 0359   MCV 96.1 11/04/2020 0359   MCH 30.2 11/04/2020 0359   MCHC 31.4 11/04/2020 0359   RDW 16.4 (H) 11/04/2020 0359   LYMPHSABS 0.5 (L) 10/28/2020 1140   MONOABS 1.1 (H) 10/28/2020 1140   EOSABS 0.0 10/28/2020 1140   BASOSABS 0.0 10/28/2020 1140   HEPATIC Function Panel Recent Labs    08/24/20 0919 10/26/20 0846 10/27/20 0023  PROT 6.7 6.4* 5.7*   HEMOGLOBIN A1C No components found for: HGA1C,  MPG CARDIAC ENZYMES Lab Results  Component Value Date   RUEAVWU 981 (H) 02/01/2018   BNP No results for input(s): PROBNP in the last 8760 hours. TSH  Recent Labs    01/19/20 1250 05/17/20 1734  TSH 2.243 1.710   CHOLESTEROL Recent Labs    02/08/20 0257  CHOL 202*    Scheduled Meds: . ALPRAZolam  0.25 mg Oral Q8H  . digoxin  0.125 mg Oral Q M,W,F  . gemfibrozil  600 mg Oral BID  . metoprolol tartrate  25 mg Oral BID  . nicotine  21 mg Transdermal Daily  . prazosin  4 mg Oral BID  . senna  1 tablet Oral BID  . sertraline  100 mg Oral QHS   Continuous Infusions: PRN Meds:.acetaminophen, bisacodyl, diphenhydrAMINE-zinc acetate, oxyCODONE, oxymetazoline  Assessment/Plan: Retroperitoneal bleed, stable Anemia of blood loss, stable Acute kidney injury on CKD IV Chronic atrial fibrillation Large left adrenal mass Pelvic mass CAD CABG S/P bioprosthetic AV HTN Tobacco use  disorder Metabolic acidosis, improving  Continue medical treatment. Increase activity. SNF.   LOS: 9 days   Time spent including chart review, lab review, examination, discussion with patient/Nurse : 30 min   Dixie Dials  MD  11/04/2020, 8:28 AM

## 2020-11-05 LAB — CBC
HCT: 31.2 % — ABNORMAL LOW (ref 36.0–46.0)
Hemoglobin: 9.7 g/dL — ABNORMAL LOW (ref 12.0–15.0)
MCH: 29.4 pg (ref 26.0–34.0)
MCHC: 31.1 g/dL (ref 30.0–36.0)
MCV: 94.5 fL (ref 80.0–100.0)
Platelets: 323 10*3/uL (ref 150–400)
RBC: 3.3 MIL/uL — ABNORMAL LOW (ref 3.87–5.11)
RDW: 16.4 % — ABNORMAL HIGH (ref 11.5–15.5)
WBC: 10.7 10*3/uL — ABNORMAL HIGH (ref 4.0–10.5)
nRBC: 0 % (ref 0.0–0.2)

## 2020-11-05 LAB — BASIC METABOLIC PANEL
Anion gap: 15 (ref 5–15)
BUN: 98 mg/dL — ABNORMAL HIGH (ref 8–23)
CO2: 19 mmol/L — ABNORMAL LOW (ref 22–32)
Calcium: 8.8 mg/dL — ABNORMAL LOW (ref 8.9–10.3)
Chloride: 100 mmol/L (ref 98–111)
Creatinine, Ser: 4.63 mg/dL — ABNORMAL HIGH (ref 0.44–1.00)
GFR, Estimated: 10 mL/min — ABNORMAL LOW (ref >60)
Glucose, Bld: 150 mg/dL — ABNORMAL HIGH (ref 70–99)
Potassium: 3.6 mmol/L (ref 3.5–5.1)
Sodium: 134 mmol/L — ABNORMAL LOW (ref 135–145)

## 2020-11-05 MED ORDER — POTASSIUM CHLORIDE 20 MEQ PO PACK
40.0000 meq | PACK | Freq: Once | ORAL | Status: AC
  Administered 2020-11-05: 40 meq via ORAL
  Filled 2020-11-05: qty 2

## 2020-11-05 MED ORDER — DIGOXIN 125 MCG PO TABS
0.0625 mg | ORAL_TABLET | ORAL | Status: DC
  Administered 2020-11-06: 0.0625 mg via ORAL
  Filled 2020-11-05: qty 1

## 2020-11-05 MED ORDER — SODIUM BICARBONATE 650 MG PO TABS
650.0000 mg | ORAL_TABLET | Freq: Every day | ORAL | Status: DC
  Administered 2020-11-05 – 2020-11-06 (×2): 650 mg via ORAL
  Filled 2020-11-05 (×2): qty 1

## 2020-11-05 NOTE — Care Management Important Message (Signed)
Important Message  Patient Details  Name: Renee Griffin MRN: [id number] Date of Birth: 03/08/1956   Medicare Important Message Given:  Yes     Shelda Altes 11/05/2020, 10:38 AM

## 2020-11-05 NOTE — Consult Note (Addendum)
Ref: Dixie Dials, MD   Subjective:  Awake. VS stable. Creatinine level is holding.  Activity level low.  Objective:  Vital Signs in the last 24 hours: Temp:  [97.6 F (36.4 C)-98.8 F (37.1 C)] 97.6 F (36.4 C) (11/11 0409) Pulse Rate:  [62-86] 67 (11/11 0830) Cardiac Rhythm: Atrial fibrillation (11/11 0732) Resp:  [16-22] 16 (11/11 0409) BP: (115-140)/(67-96) 136/67 (11/11 0830) SpO2:  [96 %-100 %] 100 % (11/11 0409) Weight:  [78.4 kg] 78.4 kg (11/11 0409)  Physical Exam: BP Readings from Last 1 Encounters:  11/05/20 136/67     Wt Readings from Last 1 Encounters:  11/05/20 78.4 kg    Weight change: 2.3 kg Body mass index is 30.62 kg/m. HEENT: [place]/AT, Eyes-Blue, Conjunctiva-Pale pink, Sclera-Non-icteric Neck: No JVD, No bruit, Trachea midline. Lungs:  Clear, Bilateral. Cardiac:  Irregular rhythm, normal S1 and S2, no S3. II/VI systolic murmur. Abdomen:  Soft, non-tender. BS present. Extremities:  No edema present. No cyanosis. No clubbing. CNS: AxOx3, Cranial nerves grossly intact, moves all 4 extremities.  Skin: Warm and dry.   Intake/Output from previous day: 11/10 0701 - 11/11 0700 In: 2268.8 [P.O.:960; I.V.:1308.8] Out: 601 [Urine:600; Stool:1]    Lab Results: BMET    Component Value Date/Time   NA 134 (L) 11/05/2020 0227   NA 135 11/04/2020 0359   NA 135 11/03/2020 0529   K 3.6 11/05/2020 0227   K 3.9 11/04/2020 0359   K 4.2 11/03/2020 0529   CL 100 11/05/2020 0227   CL 102 11/04/2020 0359   CL 103 11/03/2020 0529   CO2 19 (L) 11/05/2020 0227   CO2 18 (L) 11/04/2020 0359   CO2 17 (L) 11/03/2020 0529   GLUCOSE 150 (H) 11/05/2020 0227   GLUCOSE 121 (H) 11/04/2020 0359   GLUCOSE 103 (H) 11/03/2020 0529   BUN 98 (H) 11/05/2020 0227   BUN 95 (H) 11/04/2020 0359   BUN 95 (H) 11/03/2020 0529   CREATININE 4.63 (H) 11/05/2020 0227   CREATININE 4.64 (H) 11/04/2020 0359   CREATININE 5.01 (H) 11/03/2020 0529   CALCIUM 8.8 (L) 11/05/2020 0227   CALCIUM  9.4 11/04/2020 0359   CALCIUM 9.3 11/03/2020 0529   GFRNONAA 10 (L) 11/05/2020 0227   GFRNONAA 10 (L) 11/04/2020 0359   GFRNONAA 9 (L) 11/03/2020 0529   GFRAA 34 (L) 08/24/2020 0919   GFRAA 29 (L) 07/19/2020 0920   GFRAA 32 (L) 07/13/2020 0604   CBC    Component Value Date/Time   WBC 10.7 (H) 11/05/2020 0227   RBC 3.30 (L) 11/05/2020 0227   HGB 9.7 (L) 11/05/2020 0227   HCT 31.2 (L) 11/05/2020 0227   PLT 323 11/05/2020 0227   MCV 94.5 11/05/2020 0227   MCH 29.4 11/05/2020 0227   MCHC 31.1 11/05/2020 0227   RDW 16.4 (H) 11/05/2020 0227   LYMPHSABS 0.5 (L) 10/28/2020 1140   MONOABS 1.1 (H) 10/28/2020 1140   EOSABS 0.0 10/28/2020 1140   BASOSABS 0.0 10/28/2020 1140   HEPATIC Function Panel Recent Labs    08/24/20 0919 10/26/20 0846 10/27/20 0023  PROT 6.7 6.4* 5.7*   HEMOGLOBIN A1C No components found for: HGA1C,  MPG CARDIAC ENZYMES Lab Results  Component Value Date   CKTOTAL 735 (H) 02/01/2018   BNP No results for input(s): PROBNP in the last 8760 hours. TSH Recent Labs    01/19/20 1250 05/17/20 1734  TSH 2.243 1.710   CHOLESTEROL Recent Labs    02/08/20 0257  CHOL 202*    Scheduled  Meds: . ALPRAZolam  0.25 mg Oral Q8H  . digoxin  0.125 mg Oral Q M,W,F  . gemfibrozil  600 mg Oral BID  . metoprolol tartrate  25 mg Oral BID  . prazosin  4 mg Oral BID  . senna  1 tablet Oral BID  . sertraline  100 mg Oral QHS   Continuous Infusions: . sodium bicarbonate in 1/4 NS 1000 mL infusion 75 mL/hr at 11/05/20 0514   PRN Meds:.acetaminophen, bisacodyl, diphenhydrAMINE-zinc acetate, oxyCODONE, oxymetazoline  Assessment/Plan: Retroperitoneal bleed, acute, stable Anemia of blood loss, acute, stable Acute on chronic kidney disease, IV Chronic atrial fibrillation, permanent Large left adrenal mass Pelvic mass CAD CABG S/P bioprosthetic AV HTN Tobacco use disorder Metabolic acidosis  Agree with potassium supplement as potassium is shifted  intracellularly with alkaline infusion. Increase activity. Decrease Digoxin dose as creatinine is not improving as expected.   LOS: 10 days   Time spent including chart review, lab review, examination, discussion with patient and nurse : 30 min   Dixie Dials  MD  11/05/2020, 8:51 AM

## 2020-11-05 NOTE — Progress Notes (Signed)
Subjective:  Renee Griffin. Lewison is a 64 year old female with past medical history of chronic systolic and diastolic heart failure, chronic atrial fibrillation (on Eliquis,) CAD s/p CABG, aortic stenosis s/p bioprosthetic aortic valve, prior CVA of left cerebellum and pons, HTN, HLD, COPD, CKD IV, anxiety, and current tobacco use disorder who presented to [facility] on 10/26/2020 for evaluation of left flank pain found to have enlargement of known left adrenal mass concerning for pheochromocytoma with hemorrhage into neoplasm and retroperitoneum with hospital course complicated by the development of hemorrhagic shock and acute tubular necrosis.  Overnight, patient received tylenol for back pain.  This morning, patient reports that she has minimal pain in her back and side. She denies shortness of breath, cough, nausea, vomiting, diarrhea. She reports that she is doing well and looking forward to going to a skilled nursing facility as soon as possible. She has no further concerns or questions.  Objective:  Vital signs in last 24 hours: Vitals:   11/04/20 2101 11/04/20 2201 11/05/20 0012 11/05/20 0409  BP: 132/67 (!) 140/96 130/81 138/74  Pulse:  75 86 62  Resp:  20 18 16   Temp:  98.1 F (36.7 C) 98.3 F (36.8 C) 97.6 F (36.4 C)  TempSrc:  Oral Oral Oral  SpO2:  96% 97% 100%  Weight:    78.4 kg  Height:      On room air  Intake/Output Summary (Last 24 hours) at 11/05/2020 3428 Last data filed at 11/05/2020 0400 Gross per 24 hour  Intake 2268.75 ml  Output 601 ml  Net 1667.75 ml   Filed Weights   11/03/20 0035 11/04/20 0338 11/05/20 0409  Weight: 74.4 kg 76.1 kg 78.4 kg   Physical Exam Vitals and nursing note reviewed. Exam conducted with a chaperone present.  Constitutional:      General: She is not in acute distress.    Appearance: She is not toxic-appearing.  HENT:     Head: Normocephalic and atraumatic.  Eyes:     Extraocular Movements: Extraocular movements intact.      Conjunctiva/sclera: Conjunctivae normal.  Cardiovascular:     Rate and Rhythm: Normal rate. Rhythm irregular.  Pulmonary:     Effort: No respiratory distress.     Breath sounds: Normal breath sounds.  Abdominal:     General: Abdomen is flat. Bowel sounds are normal.     Palpations: Abdomen is soft.     Tenderness: There is no abdominal tenderness.  Musculoskeletal:        General: Normal range of motion.     Cervical back: Normal range of motion and neck supple.     Right lower leg: Edema present.     Left lower leg: Edema present.     Comments: Trace bilateral lower extremity pitting edema around the ankles  Skin:    General: Skin is warm and dry.     Capillary Refill: Capillary refill takes less than 2 seconds.  Neurological:     General: No focal deficit present.     Mental Status: She is alert. Mental status is at baseline.  Psychiatric:        Mood and Affect: Mood normal.        Behavior: Behavior normal.        Thought Content: Thought content normal.        Judgment: Judgment normal.    CBC Latest Ref Rng & Units 11/05/2020 11/04/2020 11/03/2020  WBC 4.0 - 10.5 K/uL 10.7(H) 10.5 9.4  Hemoglobin 12.0 -  15.0 g/dL 9.7(L) 10.0(L) 10.1(L)  Hematocrit 36 - 46 % 31.2(L) 31.8(L) 32.5(L)  Platelets 150 - 400 K/uL 323 308 261   BMP Latest Ref Rng & Units 11/05/2020 11/04/2020 11/03/2020  Glucose 70 - 99 mg/dL 150(H) 121(H) 103(H)  BUN 8 - 23 mg/dL 98(H) 95(H) 95(H)  Creatinine 0.44 - 1.00 mg/dL 4.63(H) 4.64(H) 5.01(H)  Sodium 135 - 145 mmol/L 134(L) 135 135  Potassium 3.5 - 5.1 mmol/L 3.6 3.9 4.2  Chloride 98 - 111 mmol/L 100 102 103  CO2 22 - 32 mmol/L 19(L) 18(L) 17(L)  Calcium 8.9 - 10.3 mg/dL 8.8(L) 9.4 9.3   IMAGING: No results found.   Assessment/Plan:  Principal Problem:   Retroperitoneal bleed Active Problems:   HTN (hypertension)   Dyslipidemia   Anxiety   Ovarian mass, left   Atrial fibrillation with RVR (HCC)   Adrenal mass greater than 4 cm in  diameter (HCC)   Acute blood loss anemia   CKD (chronic kidney disease) stage 4, GFR 15-29 ml/min (HCC)   Palliative care by specialist   Goals of care, counseling/discussion   DNR (do not resuscitate)   AKI (acute kidney injury) ([place])   Hemorrhagic shock ([place])  #Left adrenal mass concerning for pheochromocytoma, active, stable Patient with large left adrenal mass, initially diagnosed in 2019 and initial workup revealed elevated normetanephrines. Patient did not pursue further workup at that time. Repeat metanephrines during this admission revealing mild improvement, but persistent elevation. Most concerning for pheochromocytoma. Urology initiated treatment with prazosin 2mg  three times daily with plan to undergo left adrenalectomy in 6 to 12 weeks. Oncology evaluated patient and reported that there is no role for adjuvant systemic therapies for this condition. Uptitrated prazosin from 2mg  three times daily to 4mg  twice daily which patient has tolerated well. -Continue prazosin 4mg  twice daily -Left adrenalectomy in 6-12 weeks from admission -Acetaminophen Q6H PRN for pain  #Non-oliguric AKI in setting of CKD3-4, improving #Metabolic acidosis, improving Patient developed acute tubular necrosis secondary to hemorrhagic shock in the setting of her bleeding adrenal mass. Renal function worsened from 1.9 on arrival to 5.60. Creatinine function slowly improving, most recently 4.63, 4.64, 5.01, 5.40 . She continues to have appropriate urine output with 600cc out in the past 24 hours. Nephrology following with no plan for urgent dialysis at this point. Bicarbonate today 19 improved from 18 17, 16 while receiving sodium bicarbonate infusion. At this point, this condition is the reason for her continued hospitalization.  -Daily BMP, monitor creatinine -strict I/O -Appreciate nephrology recommendations -Dr. Doylene Canard with cardiology recommending decreasing of digoxin dose  #HFpEF, chronic Patient echo  reveals EF of 50-55%. Patient does not appear volume overloaded on examination, however her weight is up to 78.4kg from 72.9kg on admission. Patient currently on room air and denies symptoms of shortness of breath. -Cardiology following, appreciate recommendations  -Discontinued home torsemide 20mg  Monday, Wednesday and Friday  -Decrease digoxin to 0.0625mg  MWF  #Atrial fibrillation, chronic, stable Patient's heart rate well controlled on current regimen. -Continue metoprolol 25mg  twice daily -Holding home diltiazem -Holding home apixaban  #Large pelvic cyst, active #Cystic areas in bilateral kidneys, active Patient noted to have 10 x 7cm cystic lesion in the pelvis on CT scan concerning low-grade cystic neoplasm as well as multiple cystic areas in bilateral kidneys. Similar findings noted in initial CT Abdomen/Pelvis in February of 2019. Although further workup of these lesions is warranted, patient can obtain evaluation in outpatient setting. -Further workup following discharge:  -GYN outpatient follow-up  -  MRI pelvis with and without contrast if clinically warranted  -Consider follow-up renal sonogram or multiphase CT when the patient is able  #Hypertension, chronic Patient's blood pressure well-controlled. -Continue prazosin 4mg  twice daily -Metoprolol 25mg  twice daily -Holding home diltiazem  #Hemmorhagic shock 2/2 left adrenal mass hemorrhage, resolved Hemoglobin stable for several days, now, suggesting resolution of her bleed. We will continue to monitor her condition closely and hold her home anticoagulation. -Daily CBC, close attention to Hb -Holding home Eliquis  #Urinary tract infection, resolved Patient's UA revealed large leukocytes, >50 WBC, many bacteria, WBC clumps. Patient completed course of ceftriaxone on 11/09. Patient denies abdominal pain or pain with urination, frequency or urgency.  #HLD, chronic -Continue home gemfibrozil  #Anxiety, chronic -Continue  sertraline and xanax  #Tobacco Use Disorder, chronic  -Discontinued nicotine patch due to patient's request  Code status: DNR VTE ppx: SCDs Bowel regimen: Senna twice daily Diet: Renal with fluid restriction PT/OT recs: SNF (pending placement)  Foy Guadalajara, MD Internal Medicine Resident PGY-2 [facility] Pager: [phone] After 5pm on weekdays and 1pm on weekends: On Call pager [phone] 11/05/2020 6:32 AM

## 2020-11-05 NOTE — NC FL2 (Signed)
[place] LEVEL OF CARE SCREENING TOOL     IDENTIFICATION  Patient Name: Renee Griffin Birthdate: 1956/02/18 Sex: female Admission Date (Current Location): 10/26/2020  [facility] and [place] Number:  Herbalist and Address:  [facility] [place] [address], [place], [place] [address]      Provider Number: [id number]  Attending Physician Name and Address:  Aldine Contes, MD  Relative Name and Phone Number:  Jeannette Corpus [phone]    Current Level of Care: Hospital Recommended Level of Care: [place] Prior Approval Number:    Date Approved/Denied:   PASRR Number: pending  Discharge Plan: SNF    Current Diagnoses: Patient Active Problem List   Diagnosis Date Noted   Hemorrhagic shock ([place]) 11/01/2020   Palliative care by specialist    Goals of care, counseling/discussion    DNR (do not resuscitate)    AKI (acute kidney injury) ([place])    Adrenal mass greater than 4 cm in diameter ([place]) 10/27/2020   Acute blood loss anemia 10/27/2020   CKD (chronic kidney disease) stage 4, GFR 15-29 ml/min ([place]) 10/27/2020   Retroperitoneal bleed 10/26/2020   Acute exacerbation of chronic obstructive pulmonary disease (COPD) ([place]) 06/23/2020   Atrial fibrillation with RVR ([place]) 05/23/2020   Acute congestive heart failure ([place]) 05/17/2020   NSTEMI (non-ST elevated myocardial infarction) ([place]) 74/07/1447   Acute systolic heart failure ([place]) 01/19/2020   Acute lower GI bleeding 12/16/2018   Dehydration 04/13/2018   Ovarian mass, left    Abdominal pain 02/01/2018   Essential hypertension 03/04/2016   HLD (hyperlipidemia) 03/04/2016   Type 2 diabetes mellitus with circulatory disorder ([place]) 03/04/2016   Obesity 03/04/2016   CVA (cerebral vascular accident) ([place]) 03/05/2015   Bilateral low back pain without sciatica 03/05/2015   Essential hypertension, benign 03/05/2015   Tobacco use disorder  03/05/2015   Hyperlipidemia 03/05/2015   Hyperparathyroidism, primary ([place]) 02/13/2013   Generalized ischemic cerebrovascular disease 02/06/2013   Hyperreflexia 02/06/2013   Abnormality of gait 02/06/2013   Disturbance of skin sensation 02/06/2013   CVA (cerebral infarction) 06/03/2012   HTN (hypertension) 06/03/2012   Dyslipidemia 06/03/2012   Anxiety 06/03/2012    Orientation RESPIRATION BLADDER Height & Weight     Self, Time, Situation, Place  O2 ([place]) Incontinent, External catheter Weight: 172 lb 13.5 oz (78.4 kg) Height:  5\' 3"  (160 cm)  BEHAVIORAL SYMPTOMS/MOOD NEUROLOGICAL BOWEL NUTRITION STATUS      Continent Diet (See dc summary)  AMBULATORY STATUS COMMUNICATION OF NEEDS Skin   Limited Assist Verbally Normal                       Personal Care Assistance Level of Assistance  Bathing, Feeding, Dressing Bathing Assistance: Maximum assistance Feeding assistance: Independent Dressing Assistance: Limited assistance     Functional Limitations Info  Sight, Hearing, Speech Sight Info: Impaired Hearing Info: Adequate Speech Info: Adequate    SPECIAL CARE FACTORS FREQUENCY  PT (By licensed PT), OT (By licensed OT)     PT Frequency: 5x week OT Frequency: 5x week            Contractures Contractures Info: Not present    Additional Factors Info  Code Status, Allergies, Psychotropic Code Status Info: DNR Allergies Info: Amoxicillin-pot Clavulanate, Nicotine, Bupropion Psychotropic Info: ALPRAZolam (XANAX) tablet 0.25 mg every 8 hours, sertraline (ZOLOFT) tablet 100 mg at bedtime,         Current Medications (11/05/2020):  This  is the current hospital active medication list Current Facility-Administered Medications  Medication Dose Route Frequency Provider Last Rate Last Admin   acetaminophen (TYLENOL) tablet 650 mg  650 mg Oral Q6H PRN Aldine Contes, MD   650 mg at 11/04/20 2108   ALPRAZolam (XANAX) tablet 0.25 mg  0.25 mg Oral Q8H  Narendra, Nischal, MD   0.25 mg at 11/05/20 0830   bisacodyl (DULCOLAX) suppository 10 mg  10 mg Rectal Daily PRN Rosezella Rumpf, NP   10 mg at 11/01/20 1052   [START ON 11/06/2020] digoxin (LANOXIN) tablet 0.0625 mg  0.0625 mg Oral Q M,W,F Dixie Dials, MD       diphenhydrAMINE-zinc acetate (BENADRYL) 2-0.1 % cream   Topical BID PRN Rosezella Rumpf, NP   Given at 11/03/20 2331   gemfibrozil (LOPID) tablet 600 mg  600 mg Oral BID Christian, Rylee, MD   600 mg at 11/05/20 0830   metoprolol tartrate (LOPRESSOR) tablet 25 mg  25 mg Oral BID Charolette Forward, MD   25 mg at 11/05/20 0830   oxymetazoline (AFRIN) 0.05 % nasal spray 1 spray  1 spray Each Nare BID PRN Lacinda Axon, MD   1 spray at 10/30/20 0618   prazosin (MINIPRESS) capsule 4 mg  4 mg Oral BID Dixie Dials, MD   4 mg at 11/05/20 0830   senna (SENOKOT) tablet 8.6 mg  1 tablet Oral BID Christian, Rylee, MD   8.6 mg at 11/05/20 0830   sertraline (ZOLOFT) tablet 100 mg  100 mg Oral QHS Christian, Rylee, MD   100 mg at 11/04/20 2101   sodium bicarbonate tablet 650 mg  650 mg Oral Daily Cato Mulligan, MD         Discharge Medications: Please see discharge summary for a list of discharge medications.  Relevant Imaging Results:  Relevant Lab Results:   Additional Information SSN [id number]  Loreta Ave, LCSWA

## 2020-11-05 NOTE — Plan of Care (Signed)
  Problem: Clinical Measurements: Goal: Ability to maintain clinical measurements within normal limits will improve Outcome: Progressing   Problem: Safety: Goal: Ability to remain free from injury will improve Outcome: Progressing   Problem: Skin Integrity: Goal: Risk for impaired skin integrity will decrease Outcome: Progressing   

## 2020-11-05 NOTE — Progress Notes (Signed)
Admit: 10/26/2020 LOS: 10  Renee Griffin is a/an 64 y.o. female with a past medical history significant for CHF, atrial fibrillation, CAD status post CABG, aortic stenosis status post valve replacement, CVA, HTN, HLD, COPD, CKD, tobacco use disorder, adrenal mass possibly pheochromocytoma, admitted for left adrenal mass hemorrhage.     Subjective:  . No acute events overnight.  Reports some shortness of breath.   Denies any chest pain, nausea, vomiting or worsening abdominal pain.  Tolerating diet.  Voiding well.     11/10 0701 - 11/11 0700 In: 2268.8 [P.O.:960; I.V.:1308.8] Out: 601 [Urine:600; Stool:1]  Filed Weights   11/03/20 0035 11/04/20 0338 11/05/20 0409  Weight: 74.4 kg 76.1 kg 78.4 kg    Scheduled Meds: . ALPRAZolam  0.25 mg Oral Q8H  . digoxin  0.125 mg Oral Q M,W,F  . gemfibrozil  600 mg Oral BID  . metoprolol tartrate  25 mg Oral BID  . potassium chloride  40 mEq Oral Once  . prazosin  4 mg Oral BID  . senna  1 tablet Oral BID  . sertraline  100 mg Oral QHS   Continuous Infusions: . sodium bicarbonate in 1/4 NS 1000 mL infusion 75 mL/hr at 11/05/20 0514   PRN Meds:. Current Labs: reviewed BUN 95, Cr 4.64, Hbg 10.0, CO2 18   Physical Exam:  Blood pressure 138/74, pulse 62, temperature 97.6 F (36.4 C), temperature source Oral, resp. rate 16, height 5\' 3"  (1.6 m), weight 78.4 kg, SpO2 100 %.   GEN: wdwn, sitting in bed, nad ENT: no nasal discharge, mmm EYES: no scleral icterus, eomi CV: normal rate,irregular rhythm, no murmurs PULM: no iwob, bilateral chest rise ABD: NABS, non-distended, left abdominal tenderness SKIN: no rashes or jaundice EXT: no edema, warm and well perfused   A/P  Non oliguric AKI/CKD stage 3-4 Improving. Likely secondary to ischemic ATN in the setting of hemorrhagic shock and hypotension. No hydronephrosis seen on CT scan but there was a non obstructing stone seen in the left kidney.  Previous history of taking NSAIDs prior to  hospitalization.  -Creatine improving with IV hydration -Stop IV fluids and continue po hydration -Monitor Cr daily -Continue to monitor urine output, strict intake and output -Avoid nephrotoxic agents -Maintain BP to MAP >65 -Palliative care following will continue conversations regarding dialysis and goals of care  Hemorrhagic Shock Normotensive and Hbg improving.   Continue current management and will follow Transfuse <8 given CAD  Large Left Neoplasm with recurrent bleed Likely Pheochromocytoma given elevated metanepherines and refractory HTN.  -Urology following and recommends adrenalectomy in 6-12 weeks  Refractory HTN Likely secondary to adrenal mass BP control with alpha blockers  Afib Was on Eliquis.  Holding now secondary to bleed Continue to hold anticoagulation for now, primary team managing  Bilateral Renal Cysts Urology following.  Concern for low grade neoplasm.  Combined CHF Cardiology following  UTI Likely contributing to AKI Continue Ceftriaxone 1gm q24hr for total of 5 day course (11/06-11/10) Monitor for recurrent signs of infection.   Carollee Leitz, MD Family Medicine Residency  11/05/2020, 6:47 AM  Recent Labs  Lab 11/03/20 0529 11/04/20 0359 11/05/20 0227  NA 135 135 134*  K 4.2 3.9 3.6  CL 103 102 100  CO2 17* 18* 19*  GLUCOSE 103* 121* 150*  BUN 95* 95* 98*  CREATININE 5.01* 4.64* 4.63*  CALCIUM 9.3 9.4 8.8*   Recent Labs  Lab 11/03/20 0529 11/04/20 0359 11/05/20 0227  WBC 9.4 10.5 10.7*  HGB  10.1* 10.0* 9.7*  HCT 32.5* 31.8* 31.2*  MCV 97.6 96.1 94.5  PLT 261 308 323    Plan communicated to the primary team

## 2020-11-05 NOTE — Progress Notes (Signed)
Physical Therapy Treatment Patient Details Name: Renee Griffin MRN: [id number] DOB: 06-Apr-1956 Today's Date: 11/05/2020    History of Present Illness Renee Griffin is a 64 year old female with past medical history of chronic systolic and diastolic heart failure, chronic atrial fibrillation (on Eliquis,) CAD s/p CABG, aortic stenosis s/p bioprosthetic aortic valve, prior CVA of left cerebellum and pons, HTN, HLD, COPD, CKD IV, anxiety, and current tobacco use disorder who presented to [facility] on 10/26/2020 for evaluation of left flank pain found to have enlargement of known left adrenal mass concerning for pheochromocytoma with hemorrhage.    PT Comments    Patient received in bed, she is reluctantly agreeable to PT session. She performed LE strengthening exercises with cues. Unable to tolerate much on left LE due to reported pain. Requires min assist for supine >< sit. Declines getting up to recliner or walking in room. She agrees to stand and sidestep along edge of bed. She requires min assist to stand from slightly elevated surface. Min assist to go 3-4 feet along bed.  Patient will continue to benefit from skilled PT while here to improve strength and functional independence.         Follow Up Recommendations  SNF;Supervision for mobility/OOB     Equipment Recommendations  None recommended by PT    Recommendations for Other Services       Precautions / Restrictions Precautions Precautions: Fall Restrictions Weight Bearing Restrictions: No    Mobility  Bed Mobility Overal bed mobility: Needs Assistance Bed Mobility: Supine to Sit;Sit to Supine     Supine to sit: Min assist Sit to supine: Min assist   General bed mobility comments: assist to raise trunk in sitting and assist to bring LEs back up onto bed.  Transfers Overall transfer level: Needs assistance Equipment used: Rolling walker (2 wheeled) Transfers: Sit to/from Stand Sit to Stand: From elevated surface;Min  assist         General transfer comment: min assist to rist to standing from slightly elevated surface. .  Ambulation/Gait Ambulation/Gait assistance: Min assist Gait Distance (Feet): 4 Feet Assistive device: Rolling walker (2 wheeled) Gait Pattern/deviations: Step-to pattern;Decreased step length - left;Decreased step length - right Gait velocity: decreased   General Gait Details: patient declines sitting in recliner or walking in room. Took a few steps up toward head of bed with min assist.   Stairs             Wheelchair Mobility    Modified Rankin (Stroke Patients Only)       Balance Overall balance assessment: Needs assistance Sitting-balance support: Feet supported Sitting balance-Leahy Scale: Good     Standing balance support: Bilateral upper extremity supported;During functional activity Standing balance-Leahy Scale: Fair Standing balance comment: reliant on RW and min assist                            Cognition Arousal/Alertness: Awake/alert Behavior During Therapy: Flat affect Overall Cognitive Status: No family/caregiver present to determine baseline cognitive functioning                         Following Commands: Follows one step commands with increased time Safety/Judgement: Decreased awareness of safety;Decreased awareness of deficits Awareness: Intellectual Problem Solving: Slow processing;Decreased initiation;Requires verbal cues;Requires tactile cues        Exercises Total Joint Exercises Ankle Circles/Pumps: AROM;10 reps;Both Heel Slides: AROM;Right;10 reps Hip ABduction/ADduction: AROM;Both;10 reps  General Comments        Pertinent Vitals/Pain Pain Assessment: Faces Faces Pain Scale: Hurts a little bit Pain Location: generalized moaning with mobility. States left leg hurts with exercises Pain Descriptors / Indicators: Discomfort;Grimacing;Guarding;Moaning;Sore Pain Intervention(s): Monitored during  session;Limited activity within patient's tolerance;Repositioned    Home Living                      Prior Function            PT Goals (current goals can now be found in the care plan section) Acute Rehab PT Goals Patient Stated Goal: Patient is okay going to rehab prior to returning home PT Goal Formulation: With patient Time For Goal Achievement: 11/16/20 Potential to Achieve Goals: Good Progress towards PT goals: Not progressing toward goals - comment (patient not motivated to progress)    Frequency    Min 2X/week      PT Plan Current plan remains appropriate    Co-evaluation              AM-PAC PT "6 Clicks" Mobility   Outcome Measure  Help needed turning from your back to your side while in a flat bed without using bedrails?: A Little Help needed moving from lying on your back to sitting on the side of a flat bed without using bedrails?: A Little Help needed moving to and from a bed to a chair (including a wheelchair)?: A Lot Help needed standing up from a chair using your arms (e.g., wheelchair or bedside chair)?: A Lot Help needed to walk in hospital room?: A Lot Help needed climbing 3-5 steps with a railing? : Total 6 Click Score: 13    End of Session Equipment Utilized During Treatment: Gait belt;Oxygen Activity Tolerance: Other (comment) (patient with decreased motivation) Patient left: in bed;with call bell/phone within reach;with bed alarm set Nurse Communication: Mobility status PT Visit Diagnosis: Muscle weakness (generalized) (M62.81);Unsteadiness on feet (R26.81);Difficulty in walking, not elsewhere classified (R26.2)     Time: 2458-0998 PT Time Calculation (min) (ACUTE ONLY): 21 min  Charges:  $Therapeutic Activity: 8-22 mins                     Maison Kestenbaum, PT, GCS 11/05/20,1:05 PM

## 2020-11-05 NOTE — Progress Notes (Signed)
RE: Renee Griffin Date of Birth: 2056-09-26 Date: 11/05/20  Please be advised that the above-named patient will require a short-term nursing home stay - anticipated 30 days or less for rehabilitation and strengthening.  The plan is for return home.

## 2020-11-06 LAB — RENAL FUNCTION PANEL
Albumin: 2.4 g/dL — ABNORMAL LOW (ref 3.5–5.0)
Anion gap: 13 (ref 5–15)
BUN: 98 mg/dL — ABNORMAL HIGH (ref 8–23)
CO2: 22 mmol/L (ref 22–32)
Calcium: 9.3 mg/dL (ref 8.9–10.3)
Chloride: 102 mmol/L (ref 98–111)
Creatinine, Ser: 4.2 mg/dL — ABNORMAL HIGH (ref 0.44–1.00)
GFR, Estimated: 11 mL/min — ABNORMAL LOW (ref >60)
Glucose, Bld: 129 mg/dL — ABNORMAL HIGH (ref 70–99)
Phosphorus: 5.7 mg/dL — ABNORMAL HIGH (ref 2.5–4.6)
Potassium: 3.8 mmol/L (ref 3.5–5.1)
Sodium: 137 mmol/L (ref 135–145)

## 2020-11-06 LAB — CBC
HCT: 31.5 % — ABNORMAL LOW (ref 36.0–46.0)
Hemoglobin: 9.7 g/dL — ABNORMAL LOW (ref 12.0–15.0)
MCH: 29.8 pg (ref 26.0–34.0)
MCHC: 30.8 g/dL (ref 30.0–36.0)
MCV: 96.6 fL (ref 80.0–100.0)
Platelets: 346 10*3/uL (ref 150–400)
RBC: 3.26 MIL/uL — ABNORMAL LOW (ref 3.87–5.11)
RDW: 16.5 % — ABNORMAL HIGH (ref 11.5–15.5)
WBC: 10 10*3/uL (ref 4.0–10.5)
nRBC: 0 % (ref 0.0–0.2)

## 2020-11-06 LAB — SARS CORONAVIRUS 2 BY RT PCR (HOSPITAL ORDER, PERFORMED IN ~~LOC~~ HOSPITAL LAB): SARS Coronavirus 2: NEGATIVE

## 2020-11-06 MED ORDER — METOPROLOL TARTRATE 25 MG PO TABS: 25.0000 mg | ORAL_TABLET | Freq: Two times a day (BID) | ORAL | Status: AC

## 2020-11-06 MED ORDER — ALPRAZOLAM 0.25 MG PO TABS: 0.2500 mg | ORAL_TABLET | Freq: Three times a day (TID) | ORAL | 0 refills | Status: AC

## 2020-11-06 MED ORDER — PRAZOSIN HCL 2 MG PO CAPS
4.0000 mg | ORAL_CAPSULE | Freq: Three times a day (TID) | ORAL | Status: DC
  Administered 2020-11-06: 4 mg via ORAL
  Filled 2020-11-06 (×2): qty 2

## 2020-11-06 MED ORDER — POTASSIUM CHLORIDE 20 MEQ PO PACK
20.0000 meq | PACK | Freq: Once | ORAL | Status: AC
  Administered 2020-11-06: 20 meq via ORAL
  Filled 2020-11-06: qty 1

## 2020-11-06 MED ORDER — PRAZOSIN HCL 2 MG PO CAPS: 4.0000 mg | ORAL_CAPSULE | Freq: Three times a day (TID) | ORAL | Status: AC

## 2020-11-06 NOTE — Consult Note (Addendum)
Ref: Dixie Dials, MD   Subjective:  Awake. VS stable. HR 60-70/min.  Urine out put is fair. Creatinine now down to 4.20. Metabolic acidosis is corrected.  Activity slightly improved per patient.  Objective:  Vital Signs in the last 24 hours: Temp:  [97.6 F (36.4 C)-98 F (36.7 C)] 98 F (36.7 C) (11/12 0341) Pulse Rate:  [65-72] 72 (11/12 0807) Cardiac Rhythm: Atrial fibrillation (11/12 0726) Resp:  [20] 20 (11/11 1600) BP: (135-147)/(70-95) 147/95 (11/12 0807) SpO2:  [92 %] 92 % (11/11 1600) Weight:  [77.5 kg] 77.5 kg (11/12 0341)  Physical Exam: BP Readings from Last 1 Encounters:  11/06/20 (!) 147/95     Wt Readings from Last 1 Encounters:  11/06/20 77.5 kg    Weight change: -0.9 kg Body mass index is 30.27 kg/m. HEENT: [place]/AT, Eyes-Brown, Conjunctiva-Pale pink, Sclera-Non-icteric Neck: No JVD, No bruit, Trachea midline. Lungs:  Clear, Bilateral. Cardiac:  Irregular rhythm, normal S1 and S2, no S3. II/VI systolic murmur. Abdomen:  Soft, mildly tender. BS present. Extremities:  No edema present. No cyanosis. No clubbing. CNS: AxOx3, Cranial nerves grossly intact, moves all 4 extremities.  Skin: Warm and dry.   Intake/Output from previous day: 11/11 0701 - 11/12 0700 In: 318.5 [P.O.:290; I.V.:28.5] Out: 920 [Urine:920]    Lab Results: BMET    Component Value Date/Time   NA 137 11/06/2020 0415   NA 134 (L) 11/05/2020 0227   NA 135 11/04/2020 0359   K 3.8 11/06/2020 0415   K 3.6 11/05/2020 0227   K 3.9 11/04/2020 0359   CL 102 11/06/2020 0415   CL 100 11/05/2020 0227   CL 102 11/04/2020 0359   CO2 22 11/06/2020 0415   CO2 19 (L) 11/05/2020 0227   CO2 18 (L) 11/04/2020 0359   GLUCOSE 129 (H) 11/06/2020 0415   GLUCOSE 150 (H) 11/05/2020 0227   GLUCOSE 121 (H) 11/04/2020 0359   BUN 98 (H) 11/06/2020 0415   BUN 98 (H) 11/05/2020 0227   BUN 95 (H) 11/04/2020 0359   CREATININE 4.20 (H) 11/06/2020 0415   CREATININE 4.63 (H) 11/05/2020 0227   CREATININE  4.64 (H) 11/04/2020 0359   CALCIUM 9.3 11/06/2020 0415   CALCIUM 8.8 (L) 11/05/2020 0227   CALCIUM 9.4 11/04/2020 0359   GFRNONAA 11 (L) 11/06/2020 0415   GFRNONAA 10 (L) 11/05/2020 0227   GFRNONAA 10 (L) 11/04/2020 0359   GFRAA 34 (L) 08/24/2020 0919   GFRAA 29 (L) 07/19/2020 0920   GFRAA 32 (L) 07/13/2020 0604   CBC    Component Value Date/Time   WBC 10.0 11/06/2020 0415   RBC 3.26 (L) 11/06/2020 0415   HGB 9.7 (L) 11/06/2020 0415   HCT 31.5 (L) 11/06/2020 0415   PLT 346 11/06/2020 0415   MCV 96.6 11/06/2020 0415   MCH 29.8 11/06/2020 0415   MCHC 30.8 11/06/2020 0415   RDW 16.5 (H) 11/06/2020 0415   LYMPHSABS 0.5 (L) 10/28/2020 1140   MONOABS 1.1 (H) 10/28/2020 1140   EOSABS 0.0 10/28/2020 1140   BASOSABS 0.0 10/28/2020 1140   HEPATIC Function Panel Recent Labs    08/24/20 0919 10/26/20 0846 10/27/20 0023  PROT 6.7 6.4* 5.7*   HEMOGLOBIN A1C No components found for: HGA1C,  MPG CARDIAC ENZYMES Lab Results  Component Value Date   LPFXTKW 409 (H) 02/01/2018   BNP No results for input(s): PROBNP in the last 8760 hours. TSH Recent Labs    01/19/20 1250 05/17/20 1734  TSH 2.243 1.710   CHOLESTEROL Recent  Labs    02/08/20 0257  CHOL 202*    Scheduled Meds: . ALPRAZolam  0.25 mg Oral Q8H  . digoxin  0.0625 mg Oral Q M,W,F  . gemfibrozil  600 mg Oral BID  . metoprolol tartrate  25 mg Oral BID  . prazosin  4 mg Oral BID  . senna  1 tablet Oral BID  . sertraline  100 mg Oral QHS   Continuous Infusions: PRN Meds:.acetaminophen, bisacodyl, diphenhydrAMINE-zinc acetate, oxymetazoline  Assessment/Plan: Retroperitoneal bleed, acute, stable Anemia of blood loss, acute, stable Acute on chronic kidney disease, stage IV Chronic atrial fibrillation, permanent Moderate MR and TR Large left adrenal mass, chronic, likely pheochromocytoma or mixed Pelvic mass, chronic CAD CABG S/P bioprosthetic AV HTN Tobacco use disorder Metabolic acidosis,  corrected  Continue as need potassium supplementation Increase alpha blocker prazosin to 4 mg. tid if tolerated. Discontinue digoxin. Considering amlodipine 2.5 mg. or hydralazine 10 mg. bid for hypertension if BP remains high over weekend.   LOS: 11 days   Time spent including chart review, lab review, examination, discussion with patient/nurse/Resident : 30 min   Dixie Dials  MD  11/06/2020, 10:19 AM

## 2020-11-06 NOTE — Progress Notes (Signed)
Patient discharged at this time via ambulance with all her belongings.  Receiving nurse at [facility] did not call this nurse to get report.  Marcille Blanco, RN

## 2020-11-06 NOTE — Progress Notes (Signed)
Admit: 10/26/2020 LOS: 11  Renee Griffin is a/an 64 y.o. female with a past medical history significant for CHF, atrial fibrillation, CAD status post CABG, aortic stenosis status post valve replacement, CVA, HTN, HLD, COPD, CKD, tobacco use disorder, adrenal mass possibly pheochromocytoma, admitted for left adrenal mass hemorrhage.     Subjective:  No acute events overnight.    Denies any chest pain, worsening SOB or abdominal pain. Voiding well.     a11/11 0701 - 11/12 0700 In: 318.5 [P.O.:290; I.V.:28.5] Out: 920 [Urine:920]  Filed Weights   11/04/20 0338 11/05/20 0409 11/06/20 0341  Weight: 76.1 kg 78.4 kg 77.5 kg    Scheduled Meds: . ALPRAZolam  0.25 mg Oral Q8H  . digoxin  0.0625 mg Oral Q M,W,F  . gemfibrozil  600 mg Oral BID  . metoprolol tartrate  25 mg Oral BID  . prazosin  4 mg Oral BID  . senna  1 tablet Oral BID  . sertraline  100 mg Oral QHS  . sodium bicarbonate  650 mg Oral Daily   Continuous Infusions:  PRN Meds:. Current Labs: reviewed BUN 95, Cr 4.64, Hbg 10.0, CO2 18   Physical Exam:  Blood pressure 135/76, pulse 68, temperature 98 F (36.7 C), temperature source Oral, resp. rate 20, height 5\' 3"  (1.6 m), weight 77.5 kg, SpO2 92 %.   GEN: wdwn, sitting in bed, nad ENT: no nasal discharge, mmm EYES: no scleral icterus, eomi CV: normal rate,irregular rhythm, no murmurs PULM: no iwob, bilateral chest rise ABD: NABS, non-distended, left abdominal tenderness SKIN: no rashes or jaundice EXT: no edema, warm and well perfused   A/P  Non oliguric AKI/CKD stage 3-4 Improving. Likely secondary to ischemic ATN in the setting of hemorrhagic shock and hypotension. -Creatine improving, I think would be ok to monitor outpatient -Continue po hydration.  -Continue to monitor urine output, strict intake and output -Avoid nephrotoxic agents -Maintain BP to MAP >29  Metabolic Acidosis  Acidosis improved.  Would consider discontinuing bicarb.  Hemorrhagic  Shock Normotensive and Hbg improving.   Continue current management and will follow Transfuse <8 given CAD  Large Left Neoplasm with recurrent bleed Likely Pheochromocytoma given elevated metanepherines and refractory HTN.  -Urology following and recommends adrenalectomy in 6-12 weeks  Refractory HTN Likely secondary to adrenal mass BP control with alpha blockers  Afib Was on Eliquis.  Holding now secondary to bleed Continue to hold anticoagulation for now, primary team managing  Bilateral Renal Cysts Urology following.  Concern for low grade neoplasm.  Combined CHF Cardiology following  Carollee Leitz, MD Family Medicine Residency  11/06/2020, 6:47 AM  Recent Labs  Lab 11/04/20 0359 11/05/20 0227 11/06/20 0415  NA 135 134* 137  K 3.9 3.6 3.8  CL 102 100 102  CO2 18* 19* 22  GLUCOSE 121* 150* 129*  BUN 95* 98* 98*  CREATININE 4.64* 4.63* 4.20*  CALCIUM 9.4 8.8* 9.3  PHOS  --   --  5.7*   Recent Labs  Lab 11/04/20 0359 11/05/20 0227 11/06/20 0415  WBC 10.5 10.7* 10.0  HGB 10.0* 9.7* 9.7*  HCT 31.8* 31.2* 31.5*  MCV 96.1 94.5 96.6  PLT 308 323 346    Plan communicated to the primary team

## 2020-11-06 NOTE — NC FL2 (Signed)
[place] LEVEL OF CARE SCREENING TOOL     IDENTIFICATION  Patient Name: Renee Griffin Birthdate: 11/27/1956 Sex: female Admission Date (Current Location): 10/26/2020  [facility] [place] Number:  Herbalist and Address:  [facility] [place], [place] [address] [place], [place] [address]      Provider Number: [id number]  Attending Physician Name and Address:  Aldine Contes, MD  Relative Name and Phone Number:  Jeannette Corpus [phone]    Current Level of Care: Hospital Recommended Level of Care: [place] Prior Approval Number:    Date Approved/Denied:   PASRR Number: pending  Discharge Plan: SNF    Current Diagnoses: Patient Active Problem List   Diagnosis Date Noted  . Hemorrhagic shock ([place]) 11/01/2020  . Palliative care by specialist   . Goals of care, counseling/discussion   . DNR (do not resuscitate)   . AKI (acute kidney injury) ([place])   . Adrenal mass greater than 4 cm in diameter ([place]) 10/27/2020  . Acute blood loss anemia 10/27/2020  . CKD (chronic kidney disease) stage 4, GFR 15-29 ml/min (HCC) 10/27/2020  . Retroperitoneal bleed 10/26/2020  . Acute exacerbation of chronic obstructive pulmonary disease (COPD) ([place]) 06/23/2020  . Atrial fibrillation with RVR ([place]) 05/23/2020  . Acute congestive heart failure ([place]) 05/17/2020  . NSTEMI (non-ST elevated myocardial infarction) ([place]) 02/07/2020  . Acute systolic heart failure ([place]) 01/19/2020  . Acute lower GI bleeding 12/16/2018  . Dehydration 04/13/2018  . Ovarian mass, left   . Abdominal pain 02/01/2018  . Essential hypertension 03/04/2016  . HLD (hyperlipidemia) 03/04/2016  . Type 2 diabetes mellitus with circulatory disorder ([place]) 03/04/2016  . Obesity 03/04/2016  . CVA (cerebral vascular accident) ([place]) 03/05/2015  . Bilateral low back pain without sciatica 03/05/2015  . Essential hypertension, benign 03/05/2015  . Tobacco use disorder  03/05/2015  . Hyperlipidemia 03/05/2015  . Hyperparathyroidism, primary ([place]) 02/13/2013  . Generalized ischemic cerebrovascular disease 02/06/2013  . Hyperreflexia 02/06/2013  . Abnormality of gait 02/06/2013  . Disturbance of skin sensation 02/06/2013  . CVA (cerebral infarction) 06/03/2012  . HTN (hypertension) 06/03/2012  . Dyslipidemia 06/03/2012  . Anxiety 06/03/2012    Orientation RESPIRATION BLADDER Height & Weight     Self, Time, Situation, Place  O2 ([place]) Incontinent, External catheter Weight: 170 lb 13.7 oz (77.5 kg) Height:  5\' 3"  (160 cm)  BEHAVIORAL SYMPTOMS/MOOD NEUROLOGICAL BOWEL NUTRITION STATUS      Continent Diet (See dc summary)  AMBULATORY STATUS COMMUNICATION OF NEEDS Skin   Limited Assist Verbally Normal                       Personal Care Assistance Level of Assistance  Bathing, Feeding, Dressing Bathing Assistance: Maximum assistance Feeding assistance: Independent Dressing Assistance: Limited assistance     Functional Limitations Info  Sight, Hearing, Speech Sight Info: Impaired Hearing Info: Adequate Speech Info: Adequate    SPECIAL CARE FACTORS FREQUENCY  PT (By licensed PT), OT (By licensed OT)     PT Frequency: 5x week OT Frequency: 5x week            Contractures Contractures Info: Not present    Additional Factors Info  Code Status, Allergies, Psychotropic Code Status Info: DNR Allergies Info: Amoxicillin-pot Clavulanate, Nicotine, Bupropion Psychotropic Info: ALPRAZolam (XANAX) tablet 0.25 mg every 8 hours, sertraline (ZOLOFT) tablet 100 mg at bedtime,         Current Medications (11/06/2020):  This  is the current hospital active medication list Current Facility-Administered Medications  Medication Dose Route Frequency Provider Last Rate Last Admin  . acetaminophen (TYLENOL) tablet 650 mg  650 mg Oral Q6H PRN Aldine Contes, MD   650 mg at 11/05/20 2036  . ALPRAZolam Duanne Moron) tablet 0.25 mg  0.25 mg Oral Q8H  Narendra, Nischal, MD   0.25 mg at 11/06/20 0807  . bisacodyl (DULCOLAX) suppository 10 mg  10 mg Rectal Daily PRN Rosezella Rumpf, NP   10 mg at 11/01/20 1052  . diphenhydrAMINE-zinc acetate (BENADRYL) 2-0.1 % cream   Topical BID PRN Rosezella Rumpf, NP   Given at 11/03/20 2331  . gemfibrozil (LOPID) tablet 600 mg  600 mg Oral BID Darrick Meigs, Rylee, MD   600 mg at 11/06/20 0810  . metoprolol tartrate (LOPRESSOR) tablet 25 mg  25 mg Oral BID Charolette Forward, MD   25 mg at 11/06/20 0810  . oxymetazoline (AFRIN) 0.05 % nasal spray 1 spray  1 spray Each Nare BID PRN Lacinda Axon, MD   1 spray at 10/30/20 0618  . prazosin (MINIPRESS) capsule 4 mg  4 mg Oral TID Dixie Dials, MD      . senna (SENOKOT) tablet 8.6 mg  1 tablet Oral BID Darrick Meigs, Rylee, MD   8.6 mg at 11/06/20 0807  . sertraline (ZOLOFT) tablet 100 mg  100 mg Oral QHS Christian, Rylee, MD   100 mg at 11/05/20 2036     Discharge Medications: Please see discharge summary for a list of discharge medications.  Relevant Imaging Results:  Relevant Lab Results:   Additional Information SSN [id number]    OUTPATIENT PALLIATIVE TO FOLLOW  Bradee Common B Ludwika Rodd, LCSWA

## 2020-11-06 NOTE — Progress Notes (Signed)
This RN called [facility] to give report.  Spoke with Freda Munro, who requested this RN to leave call back number for the receiving nurse to call and get report later.  Marcille Blanco, RN

## 2020-11-06 NOTE — Progress Notes (Signed)
Subjective:  [name]. Band is a 64 year old female with past medical history of chronic systolic and diastolic heart failure, chronic atrial fibrillation (on Eliquis,) CAD s/p CABG, aortic stenosis s/p bioprosthetic aortic valve, prior CVA of left cerebellum and pons, HTN, HLD, COPD, CKD IV, anxiety, and tobacco use disorder who presented to [facility] on 10/26/2020 for evaluation of left flank pain found to have enlargement of known left adrenal mass concerning for pheochromocytoma with hemorrhage into neoplasm and retroperitoneum with hospital course complicated by the development of hemorrhagic shock and acute tubular necrosis.  Overnight, patient received acetaminophen for chronic low back pain.  This morning, she reports mild pain in her lower back, but denies pain in her left side. She reports feeling well and has no additional complaints. She has a good appetite and is enjoying her breakfast. She looks forward to being discharged to a skilled nursing facility. She has no further questions or concerns.  Objective:  Vital signs in last 24 hours: Vitals:   11/05/20 1600 11/05/20 1948 11/05/20 2036 11/06/20 0341  BP: 138/70 (!) 141/91 135/76   Pulse: 65  68   Resp: 20     Temp: 97.6 F (36.4 C) 98 F (36.7 C)  98 F (36.7 C)  TempSrc: Oral Oral  Oral  SpO2: 92%     Weight:    77.5 kg  Height:      On room air  Intake/Output Summary (Last 24 hours) at 11/06/2020 0720 Last data filed at 11/05/2020 1759 Gross per 24 hour  Intake 318.5 ml  Output 920 ml  Net -601.5 ml   Filed Weights   11/04/20 0338 11/05/20 0409 11/06/20 0341  Weight: 76.1 kg 78.4 kg 77.5 kg   Physical Exam Vitals and nursing note reviewed. Exam conducted with a chaperone present.  Constitutional:      General: She is not in acute distress.    Appearance: She is not toxic-appearing.  HENT:     Head: Normocephalic and atraumatic.  Eyes:     Extraocular Movements: Extraocular movements intact.      Conjunctiva/sclera: Conjunctivae normal.  Cardiovascular:     Rate and Rhythm: Normal rate. Rhythm irregular.  Pulmonary:     Effort: No respiratory distress.     Breath sounds: Normal breath sounds.  Abdominal:     General: Abdomen is flat. Bowel sounds are normal.     Palpations: Abdomen is soft.     Tenderness: There is no abdominal tenderness.  Musculoskeletal:        General: Normal range of motion.     Cervical back: Normal range of motion and neck supple.     Right lower leg: No edema.     Left lower leg: No edema.     Comments: Trace bilateral lower extremity pitting edema around the ankles  Skin:    General: Skin is warm and dry.     Capillary Refill: Capillary refill takes less than 2 seconds.  Neurological:     General: No focal deficit present.     Mental Status: She is alert. Mental status is at baseline.  Psychiatric:        Mood and Affect: Mood normal.        Behavior: Behavior normal.        Thought Content: Thought content normal.        Judgment: Judgment normal.    CBC Latest Ref Rng & Units 11/06/2020 11/05/2020 11/04/2020  WBC 4.0 - 10.5 K/uL 10.0 10.7(H)  10.5  Hemoglobin 12.0 - 15.0 g/dL 9.7(L) 9.7(L) 10.0(L)  Hematocrit 36 - 46 % 31.5(L) 31.2(L) 31.8(L)  Platelets 150 - 400 K/uL 346 323 308   BMP Latest Ref Rng & Units 11/06/2020 11/05/2020 11/04/2020  Glucose 70 - 99 mg/dL 129(H) 150(H) 121(H)  BUN 8 - 23 mg/dL 98(H) 98(H) 95(H)  Creatinine 0.44 - 1.00 mg/dL 4.20(H) 4.63(H) 4.64(H)  Sodium 135 - 145 mmol/L 137 134(L) 135  Potassium 3.5 - 5.1 mmol/L 3.8 3.6 3.9  Chloride 98 - 111 mmol/L 102 100 102  CO2 22 - 32 mmol/L 22 19(L) 18(L)  Calcium 8.9 - 10.3 mg/dL 9.3 8.8(L) 9.4   IMAGING: No results found.   Assessment/Plan:  Principal Problem:   Retroperitoneal bleed Active Problems:   HTN (hypertension)   Dyslipidemia   Anxiety   Ovarian mass, left   Atrial fibrillation with RVR (HCC)   Adrenal mass greater than 4 cm in diameter (HCC)    Acute blood loss anemia   CKD (chronic kidney disease) stage 4, GFR 15-29 ml/min (HCC)   Palliative care by specialist   Goals of care, counseling/discussion   DNR (do not resuscitate)   AKI (acute kidney injury) ([place])   Hemorrhagic shock ([place])  [name] is a 64 year old female with past medical history of chronic systolic and diastolic heart failure, chronic atrial fibrillation (on Eliquis,) CAD s/p CABG, aortic stenosis s/p bioprosthetic aortic valve, prior CVA of left cerebellum and pons, HTN, HLD, COPD, CKD IV, anxiety, and tobacco use disorder who presented to [facility] on 10/26/2020 for evaluation of left flank pain found to have enlargement of known left adrenal mass concerning for pheochromocytoma with hemorrhage into neoplasm and retroperitoneum with hospital course complicated by the development of hemorrhagic shock and acute tubular necrosis.  #Left adrenal mass concerning for pheochromocytoma, active, stable Patient with large left adrenal mass, initially diagnosed in 2019 and workup revealing elevated normetanephrines. Patient was lost to follow-up shortly after diagnosis. Repeat metanephrines during this admission revealing mild improvement, but persistently elevated normetanephrines. Her intra-abdominal mass is most concerning for pheochromocytoma. Urology initiated treatment with prazosin 2mg  three times daily with plan to undergo left adrenalectomy in 6 to 12 weeks. Oncology evaluated patient and reported that there is no role for adjuvant systemic therapies for this condition. Patient's prazosin was titrated up to 4mg  twice daily with appropriate tolerance. -Continue prazosin 4mg  twice daily -Left adrenalectomy in 6-12 weeks from admission -Acetaminophen Q6H PRN for pain  #Non-oliguric AKI in setting of CKD3-4, improving #Metabolic acidosis, resolved Patient developed acute tubular necrosis secondary to hemorrhagic shock in the setting of her bleeding adrenal mass. Renal  function worsened from 1.9 on arrival to high of 5.60. Creatinine function slowly improving, most recently 4.20, 4.63, 4.64, 5.01, 5.40 . She continues to have appropriate urine output with 920cc out in the past 24 hours. Nephrology following with no plan for urgent dialysis at this point. Bicarbonate today 22 improved from 19, 18, 17, 16 following IV sodium bicarbonate followed by transition to oral sodium bicarbonate. Appreciate nephrology's recommendations regarding monitoring of renal function following discharge to SNF.  -Daily RFP while admitted -Discontinue sodium bicarbonate tablets -strict I/O -Appreciate nephrology recommendations for monitoring of renal function following discharge  #HFpEF, chronic Patient echocardiogram reveals EF of 50-55%. Patient does not appear volume overloaded on examination. Although her weight is up to 77.5kg from 72.9kg on admission, this is likely in the setting of her increased PO intake, resolution of bleed, and inactivity. Patient  currently breathing well on room air and denies symptoms of shortness of breath. -Cardiology following, appreciate recommendations  -Discontinued home torsemide 20mg  Monday, Wednesday and Friday  -Continue digoxin 0.0625mg  MWF  #Atrial fibrillation, chronic, stable Patient's heart rate well controlled on current regimen. -Continue metoprolol 25mg  twice daily -Holding home diltiazem -Holding home apixaban  #Large pelvic cyst, active #Cystic areas in bilateral kidneys, active Patient noted to have 10 x 7cm cystic lesion in the pelvis on CT scan concerning low-grade cystic neoplasm as well as multiple cystic areas in bilateral kidneys. Similar findings noted in initial CT Abdomen/Pelvis in February of 2019. Although further workup of these lesions is warranted, patient can obtain evaluation in outpatient setting. -Further workup following discharge:  -GYN outpatient follow-up  -MRI pelvis with and without contrast if clinically  warranted  -Consider follow-up renal sonogram or multiphase CT when the patient is able  #Hypertension, chronic Patient's blood pressure well-controlled. -Continue prazosin 4mg  twice daily -Metoprolol 25mg  twice daily -Holding home diltiazem  #Hemmorhagic shock 2/2 left adrenal mass hemorrhage, resolved Hemoglobin stable for approximately one week, now, suggesting resolution of her bleed. We will continue to monitor her condition closely and hold her home anticoagulation. -Daily CBC, close attention to Hb -Holding home Eliquis  #Urinary tract infection, resolved Patient's UA revealed large leukocytes, >50 WBC, many bacteria, WBC clumps. Patient completed course of ceftriaxone on 11/09. Patient denies abdominal pain or pain with urination, frequency or urgency.  #HLD, chronic -Continue home gemfibrozil  #Anxiety, chronic -Continue sertraline and xanax  #Tobacco Use Disorder, chronic  -Continue to monitor  Code status: DNR VTE ppx: SCDs Bowel regimen: Senna twice daily Diet: Renal with fluid restriction PT/OT recs: SNF (pending placement)  Foy Gu[place], MD Internal Medicine Resident PGY-2 [facility] Res[facility] Pager: [phone] After 5pm on weekdays and 1pm on weekends: On Call pager [phone] 11/06/2020 7:20 AM

## 2020-11-06 NOTE — Discharge Summary (Signed)
Name: Renee Griffin MRN: [id number] DOB: 17-Jul-1956 64 y.o. PCP: Renee Dials, Griffin  Date of Admission: 10/26/2020  8:41 AM Date of Discharge:  Attending Physician: Renee Griffin  Discharge Diagnosis: 1. Hemorrhagic shock (resolved) 2. Retroperitoneal bleed (resolved) 3. Adrenal mass 4. Blood loss anemia 5. 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 Chronic HFpEF 7. Atrial fibrillation (chronic) with RVR (resolved) 8. Pelvic mass 9. Hypertension (chronic) 10. Urinary tract infection (resolved) 11. Hyperlipidemia (resolved) 12. Anxiety (chronic) 13. Tobacco use disorder (chronic) 14. DNR  Discharge Medications: Allergies as of 11/06/2020      Reactions   Amoxicillin-pot Clavulanate Diarrhea   Has patient had a PCN reaction causing immediate rash, facial/tongue/throat swelling, SOB or lightheadedness with hypotension: Unknown Has patient had a PCN reaction causing severe rash involving mucus membranes or skin necrosis:UnknoWN Has patient had a PCN reaction that required hospitalization: Unknown Has patient had a PCN reaction occurring within the last 10 years: Unknown If all of the above answers are "NO", then may proceed with Cephalosporin use.   Bupropion Nausea Only   Nicotine Nausea Only, Rash   Reaction to patches      Medication List    STOP taking these medications   Accu-Chek Aviva Plus test strip Generic drug: glucose blood   Accu-Chek Aviva Plus w/Device Kit   Accu-Chek Softclix Lancets lancets   allopurinol 100 MG tablet Commonly known as: ZYLOPRIM   apixaban 2.5 MG Tabs tablet Commonly known as: ELIQUIS   aspirin EC 81 MG tablet   digoxin 0.125 MG tablet Commonly known as: LANOXIN   diltiazem 120 MG 24 hr capsule Commonly known as: CARDIZEM CD   loperamide 2 MG capsule Commonly known as: IMODIUM   nitroGLYCERIN 0.4 MG SL tablet Commonly known as: NITROSTAT   oxymetazoline 0.05 % nasal spray Commonly known as: AFRIN   pantoprazole 40 MG  tablet Commonly known as: PROTONIX   potassium chloride 10 MEQ tablet Commonly known as: KLOR-CON   promethazine 25 MG tablet Commonly known as: PHENERGAN   tiZANidine 2 MG tablet Commonly known as: ZANAFLEX   torsemide 20 MG tablet Commonly known as: DEMADEX   valACYclovir 500 MG tablet Commonly known as: VALTREX     TAKE these medications   acetaminophen 325 MG tablet Commonly known as: TYLENOL Take 2 tablets (650 mg total) by mouth every 4 (four) hours as needed for headache or mild pain.   albuterol 108 (90 Base) MCG/ACT inhaler Commonly known as: VENTOLIN HFA Inhale 2 puffs into the lungs every 6 (six) hours as needed for wheezing or shortness of breath.   ALPRAZolam 0.25 MG tablet Commonly known as: XANAX Take 1 tablet (0.25 mg total) by mouth every 8 (eight) hours. What changed:   medication strength  how much to take  when to take this  reasons to take this   cholecalciferol 25 MCG (1000 UNIT) tablet Commonly known as: VITAMIN D3 Take 1 tablet (1,000 Units total) by mouth daily.   ferrous sulfate 325 (65 FE) MG tablet Take 1 tablet (325 mg total) by mouth daily with breakfast.   gemfibrozil 600 MG tablet Commonly known as: LOPID Take 600 mg by mouth 2 (two) times daily.   metoprolol tartrate 25 MG tablet Commonly known as: LOPRESSOR Take 1 tablet (25 mg total) by mouth 2 (two) times daily. What changed: how much to take   multivitamin with minerals Tabs tablet Take 1 tablet by mouth daily.   prazosin 2 MG capsule Commonly known  as: MINIPRESS Take 2 capsules (4 mg total) by mouth 3 (three) times daily.   sertraline 100 MG tablet Commonly known as: ZOLOFT Take 100 mg by mouth at bedtime.       Disposition and follow-up:   Renee Griffin was discharged from [facility] [address] in Stable condition.  At the hospital follow up visit please address:  #Left adrenal mass concerning for pheochromocytoma, active Medications: prazosin  54m twice daily Follow up appointment: urology. Will need adrenalectomy in 6-12 weeks  Acute blood loss anemia. Hgb stable at discharge.  Follow up labs: CBC  AKI on CKD 3-4 secondary to ATN [facility] up labs: Renal function panel in one week.  Atrial fibrillation (chronic). Anticoagulation held due to high risk for bleeding. HFpEF (chronic) Hypertension (chronic) HLD (chronic) Follows with Dr. KDoylene Griffin cardiology Medications: metoprolol 252mtwice daily, digoxin 0.062588mWF, gemfibrozil  #Large pelvic cyst, active #Cystic areas in bilateral kidneys, active Follow up appointments: GYN MRI pelvis with and without contrast if clinically warranted Consider follow-up renal sonogram or multiphase CT when the patient is able  #Anxiety, chronic Medications: sertraline and xanax  2.  Labs / imaging needed at time of follow-up: CBC, BMP  3.  Pending labs/ test needing follow-up: none  Follow-up Appointments:  Contact information for follow-up providers    [facility] [place] [facility].   Contact information: [address].   Contact information: [address].   Specialty: Gynecology Contact information: [id number] [phone]        Contact information for after-discharge care    [facility] .   Service: Skilled Nursing Contact information: [phone]              Hospital Course: #Left adrenal mass concerning for pheochromocytoma, active, stable Patient with large left adrenal mass, initially diagnosed in 2019 and workup revealing elevated normetanephrines.  Patient was lost to follow-up shortly after diagnosis. Repeat metanephrines during this admission revealing mild improvement, but persistently elevated normetanephrines. Her intra-abdominal mass is most concerning for pheochromocytoma. Urology initiated treatment with prazosin 2mg79mree times daily with plan to undergo left adrenalectomy in 6 to 12 weeks. Oncology evaluated patient and reported that there is no role for adjuvant systemic therapies for this condition. Patient's prazosin was titrated up to 4mg 2mce daily with appropriate tolerance. -Continue prazosin 4mg t77me daily -Left adrenalectomy in 6-12 weeks from admission -Acetaminophen Q6H PRN for pain  #Non-oliguric AKI in setting of CKD3-4, improving #Metabolic acidosis, resolved Patient developed acute tubular necrosis secondary to hemorrhagic shock in the setting of her bleeding adrenal mass. Renal function worsened from 1.9 on arrival to high of 5.60. Creatinine function slowly improving, most recently 4.20, 4.63, 4.64, 5.01, 5.40 . She continues to have appropriate urine output with 920cc out in the past 24 hours. Nephrology following with no plan for urgent dialysis at this point. Bicarbonate today 22 improved from 19, 18, 17, 16 following IV sodium bicarbonate followed by transition to oral sodium bicarbonate. Appreciate nephrology's recommendations regarding monitoring of renal function following discharge to SNF.  -Daily RFP while admitted -Discontinue  sodium bicarbonate tablets -strict I/O -Appreciate nephrology recommendations for monitoring of renal function following discharge  #HFpEF, chronic Patient echocardiogram reveals EF of 50-55%. Patient does not appear volume overloaded on examination. Although her weight is up to 77.5kg from 72.9kg on admission, this is likely in the setting of her increased PO intake, resolution of bleed, and inactivity. Patient currently breathing well on room air and denies symptoms of shortness of  breath. -Cardiology following, appreciate recommendations  -Discontinued home torsemide 60m Monday, Wednesday and Friday  -Continue digoxin 0.06246mMWF  #Atrial fibrillation, chronic, stable Patient's heart rate well controlled on current regimen. -Continue metoprolol 2527mwice daily -Holding home diltiazem -Holding home apixaban  #Large pelvic cyst, active #Cystic areas in bilateral kidneys, active Patient noted to have 10 x 7cm cystic lesion in the pelvis on CT scan concerning low-grade cystic neoplasm as well as multiple cystic areas in bilateral kidneys. Similar findings noted in initial CT Abdomen/Pelvis in February of 2019. Although further workup of these lesions is warranted, patient can obtain evaluation in outpatient setting. -Further workup following discharge: -GYN outpatient follow-up -MRI pelvis with and without contrast if clinically warranted -Consider follow-up renal sonogram or multiphase CT when the patient is able  #Hypertension, chronic Patient's blood pressure well-controlled. -Continue prazosin 4mg15mice daily -Metoprolol 25mg77mce daily -Holding home diltiazem  #Hemmorhagic shock 2/2 left adrenal mass hemorrhage, resolved Hemoglobin stable for approximately one week, now, suggesting resolution of her bleed. We will continue to monitor her condition closely and hold her home anticoagulation. -Daily CBC, close attention to Hb -Holding home Eliquis  #Urinary tract infection, resolved Patient's UA revealed large leukocytes, >50 WBC, many bacteria, WBC clumps. Patient completed course of ceftriaxone on 11/09. Patient denies abdominal pain or pain with urination, frequency or urgency.  Discharge Vitals:   BP (!) 147/95   Pulse 72   Temp 98.1 F (36.7 C) (Oral)   Resp 20   Ht _0  (1.6 m)   Wt 77.5 kg   SpO2 92%   BMI 30.27 kg/m   Pertinent Labs, Studies, and Procedures:  CT ABDOMEN PELVIS WO CONTRAST  Result Date: 10/26/2020 CLINICAL  DATA:  Acute nonlocalized LEFT flank pain, LEFT-sided pain EXAM: CT ABDOMEN AND PELVIS WITHOUT CONTRAST TECHNIQUE: Multidetector CT imaging of the abdomen and pelvis was performed following the standard protocol without IV contrast. COMPARISON:  12/25/2018 FINDINGS: Lower chest: Mild septal thickening at the lung bases. No dense consolidation. No pleural effusion. Heart size is enlarged with signs of mitral annular and aortic valvular calcification. No pericardial effusion. Small pleural effusions in the chest. Trace on the LEFT and trace on the RIGHT slightly greater on the RIGHT than the LEFT. Hepatobiliary: Normal appearance of the liver on noncontrast imaging. No pericholecystic stranding. Pancreas: Pancreas splayed over a large LEFT adrenal mass, indistinct margins in the setting of periadrenal hemorrhage. Spleen: Stable low-density lesion in the spleen. Adrenals/Urinary Tract: Lobular low-attenuation lesion in the RIGHT adrenal gland is unchanged measuring approximately 2.4 x 2.4 cm. LEFT adrenal lesion with heterogeneity, calcification and enlargement since the previous imaging study 13 x 13 cm previously approximately 10 cm greatest axial dimension. This measures approximately 14 cm greatest craniocaudal dimension previously approximately 10 cm. Waves of variable density extend from the lesion which displaces the kidney inferiorly and bowel loops and pancreas anteriorly with much more mass effect upon the pancreas and LEFT upper quadrant structures than on the prior study. Cystic areas in the bilateral kidneys, some areas indeterminate based on density  values, for instance LEFT kidney with a 1.7 x 1.6 cm area measuring 42 Hounsfield units (image 53, series 3) nephrolithiasis on the LEFT. This is stable compared to December of 2019. Slight enlargement of the largest area on the RIGHT that previously measured water density now with 45 Hounsfield unit density on image 44 of series 3. Stable appearance of  intermediate density lesion in the interpolar RIGHT kidney on image 42 of series 3 No nephrolithiasis on the RIGHT. 6 mm calculus in the interpolar LEFT kidney and vascular calcifications in the LEFT kidney as well. Urinary bladder under distended limiting assessment. Stomach/Bowel: Mass effect upon bowel loops in the upper abdomen. No signs of bowel obstruction or acute bowel process. Vascular/Lymphatic: Calcified atheromatous plaque of the abdominal aorta. No aneurysmal dilation. There is no gastrohepatic or hepatoduodenal ligament lymphadenopathy. No retroperitoneal or mesenteric lymphadenopathy. No pelvic sidewall lymphadenopathy. Reproductive: Cystic lesion in the pelvis measuring 10 x 7 cm when measured in a similar fashion approximately 8 x 6 cm. Other: Small volume ascites. Density between 10 and 20 Hounsfield units throughout the abdomen. Musculoskeletal: No acute bone finding. No destructive bone process. IMPRESSION: 1. Interval enlargement of the LEFT adrenal mass with heterogeneity, calcification with enlargement since the previous imaging study. This lesion displaces the kidney inferiorly and bowel loops and pancreas anteriorly with much more mass effect upon the pancreas and LEFT upper quadrant structures than on the prior study. Hemorrhage into existing adrenal neoplasm and into adjacent retroperitoneum, mixed density with areas of high density could be seen in the setting of ongoing bleeding. Given size and appearance adrenal cortical carcinoma and pheochromocytoma are considered. Biochemical correlation and endocrine and surgical consultation may be helpful 2. Ascites in the abdomen and pelvis. Density values between 10 and 20 Hounsfield units, small amount of blood in the peritoneum is possible though most all of the hyperdense material appears to be either periadrenal or in the anterior pararenal space in the LEFT hemiabdomen. 3. Cystic lesion in the pelvis measuring 10 x 7 cm when measured in a  similar fashion approximately 8 x 6 cm. Indolent neoplasm could have this appearance. This is concerning for low-grade cystic neoplasm. Recommend GYN consult, and consider pelvis MRI w/o and w/ contrast if clinically warranted. Note: This recommendation does not apply to premenarchal patients and to those with increased risk (genetic, family history, elevated tumor markers or other high-risk factors) of ovarian cancer. Reference: JACR 2020 Feb; 17(2):248-254 4. Cystic areas in the bilateral kidneys, some areas indeterminate based on density values. Slight enlargement of the largest area on the RIGHT that previously measured water density now with 45 Hounsfield units. Some of these areas could represent small solid renal lesions though many appeared simple in terms of density on prior imaging. Consider follow-up renal sonogram or multiphase CT when the patient is able. 5. Small pleural effusions in the chest, associated with septal thickening at the lung bases could reflect mild pulmonary edema. 6. Cardiomegaly with signs of mitral annular and aortic valvular calcification. 7. Aortic atherosclerosis. Coronary artery disease and valvular disease. These results were called by telephone at the time of interpretation on 10/26/2020 at 1:29 pm to provider [facility] , who verbally acknowledged these results. Aortic Atherosclerosis (ICD10-I70.0). Electronically Signed   By: Zetta Bills M.D.   On: 10/26/2020 13:30   DG Chest Portable 1 View  Result Date: 10/26/2020 CLINICAL DATA:  Shortness of breath and back pain EXAM: PORTABLE CHEST 1 VIEW COMPARISON:  07/13/2020 FINDINGS: Cardiac shadow  is enlarged but stable. Postsurgical changes are again seen. Lungs are well aerated bilaterally. Mild vascular congestion is noted but stable from the prior exam. No significant edema or focal infiltrate is noted. No bony abnormality is seen. IMPRESSION: Mild vascular congestion stable from the prior exam. Electronically Signed    By: Inez Catalina M.D.   On: 10/26/2020 15:17   ECHOCARDIOGRAM COMPLETE  Result Date: 10/27/2020    ECHOCARDIOGRAM REPORT   Patient Name:   BREELEY BISCHOF Date of Exam: 10/27/2020 Medical Rec #:  [id number]      Height:       63.0 in Accession #:    [id number]     Weight:       152.6 lb Date of Birth:  [birth year]      BSA:          1.723 m Patient Age:    51 years       BP:           118/68 mmHg Patient Gender: F              HR:           110 bpm. Exam Location:  Inpatient Procedure: 2D Echo, Cardiac Doppler and Color Doppler Indications:     I48.0 Paroxysmal atrial fibrillation  History:         Patient has prior history of Echocardiogram examinations, most                  recent 02/08/2020. Stroke; Risk Factors:Hypertension, Diabetes                  and Dyslipidemia.                  Aortic Valve: bioprosthetic valve is present in the aortic                  position.  Sonographer:     Jonelle Sidle Dance Referring Phys:  Worth Diagnosing Phys: Renee Dials Griffin IMPRESSIONS  1. Left ventricular ejection fraction, by estimation, is 50 to 55%. The left ventricle has low normal function. The left ventricle demonstrates regional wall motion abnormalities (see scoring diagram/findings for description). There is mild concentric left ventricular hypertrophy. Left ventricular diastolic parameters are indeterminate. There is moderate hypokinesis of the left ventricular, basal inferoseptal wall and inferior wall.  2. Right ventricular systolic function is normal. The right ventricular size is normal. There is normal pulmonary artery systolic pressure.  3. Left atrial size was severely dilated.  4. Right atrial size was severely dilated.  5. The mitral valve is degenerative. Moderate to severe mitral valve regurgitation. Moderate to severe mitral annular calcification.  6. Tricuspid valve regurgitation is moderate to severe.  7. The aortic valve is tricuspid. There is mild calcification of the aortic valve. There is  mild thickening of the aortic valve. Aortic valve regurgitation is trivial. Mild aortic valve stenosis. There is a bioprosthetic valve present in the aortic position.  8. There is mild (Grade II) atheroma plaque.  9. The inferior vena cava is normal in size with greater than 50% respiratory variability, suggesting right atrial pressure of 3 mmHg. FINDINGS  Left Ventricle: Left ventricular ejection fraction, by estimation, is 50 to 55%. The left ventricle has low normal function. The left ventricle demonstrates regional wall motion abnormalities. Moderate hypokinesis of the left ventricular, basal inferoseptal wall and inferior wall. The left ventricular internal cavity size was normal in size. There is  mild concentric left ventricular hypertrophy. Left ventricular diastolic parameters are indeterminate.  LV Wall Scoring: The basal inferolateral segment, basal inferior segment, and basal inferoseptal segment are hypokinetic. The entire anterior wall, antero-lateral wall, mid and distal lateral wall, entire anterior septum, mid and distal inferior wall, and mid inferoseptal segment are normal. Right Ventricle: The right ventricular size is normal. No increase in right ventricular wall thickness. Right ventricular systolic function is normal. There is normal pulmonary artery systolic pressure. The tricuspid regurgitant velocity is 2.86 m/s, and  with an assumed right atrial pressure of 3 mmHg, the estimated right ventricular systolic pressure is 25.8 mmHg. Left Atrium: Left atrial size was severely dilated. Right Atrium: Right atrial size was severely dilated. Pericardium: There is no evidence of pericardial effusion. Mitral Valve: The mitral valve is degenerative in appearance. There is moderate thickening of the mitral valve leaflet(s). There is moderate calcification of the mitral valve leaflet(s). Mildly decreased mobility of the mitral valve leaflets. Moderate to  severe mitral annular calcification. Moderate to  severe mitral valve regurgitation. Tricuspid Valve: The tricuspid valve is normal in structure. Tricuspid valve regurgitation is moderate to severe. Aortic Valve: The aortic valve is tricuspid. There is mild calcification of the aortic valve. There is mild thickening of the aortic valve. There is mild aortic valve annular calcification. Aortic valve regurgitation is trivial. Mild aortic stenosis is present. Aortic valve mean gradient measures 11.3 mmHg. Aortic valve peak gradient measures 22.9 mmHg. Aortic valve area, by VTI measures 1.36 cm. There is a bioprosthetic valve present in the aortic position. Pulmonic Valve: The pulmonic valve was normal in structure. Pulmonic valve regurgitation is not visualized. Aorta: The aortic root is normal in size and structure. There is mild (Grade II) atheroma plaque. Venous: The inferior vena cava is normal in size with greater than 50% respiratory variability, suggesting right atrial pressure of 3 mmHg. IAS/Shunts: The interatrial septum was not assessed.  LEFT VENTRICLE PLAX 2D LVIDd:         4.40 cm LVIDs:         3.20 cm LV PW:         1.20 cm LV IVS:        1.30 cm LVOT diam:     2.10 cm LV SV:         53 LV SV Index:   31 LVOT Area:     3.46 cm  RIGHT VENTRICLE          IVC RV Basal diam:  3.10 cm  IVC diam: 1.90 cm RV Mid diam:    2.00 cm TAPSE (M-mode): 1.5 cm LEFT ATRIUM              Index       RIGHT ATRIUM           Index LA diam:        4.70 cm  2.73 cm/m  RA Area:     24.80 cm LA Vol (A2C):   95.2 ml  55.24 ml/m RA Volume:   75.90 ml  44.04 ml/m LA Vol (A4C):   106.0 ml 61.50 ml/m LA Biplane Vol: 104.0 ml 60.34 ml/m  AORTIC VALVE AV Area (Vmax):    1.40 cm AV Area (Vmean):   1.33 cm AV Area (VTI):     1.36 cm AV Vmax:           239.33 cm/s AV Vmean:          158.333 cm/s AV VTI:  0.387 m AV Peak Grad:      22.9 mmHg AV Mean Grad:      11.3 mmHg LVOT Vmax:         96.40 cm/s LVOT Vmean:        60.800 cm/s LVOT VTI:          0.152 m LVOT/AV VTI  ratio: 0.39  AORTA Ao Root diam: 3.90 cm Ao Asc diam:  2.70 cm MITRAL VALVE                TRICUSPID VALVE MV Area (PHT): 3.42 cm     TR Peak grad:   32.7 mmHg MV Decel Time: 222 msec     TR Vmax:        286.00 cm/s MV E velocity: 159.50 cm/s                             SHUNTS                             Systemic VTI:  0.15 m                             Systemic Diam: 2.10 cm Renee Dials Griffin Electronically signed by Renee Dials Griffin Signature Date/Time: 10/27/2020/2:28:27 PM    Final    CBC    Component Value Date/Time   WBC 10.0 11/06/2020 0415   RBC 3.26 (L) 11/06/2020 0415   HGB 9.7 (L) 11/06/2020 0415   HCT 31.5 (L) 11/06/2020 0415   PLT 346 11/06/2020 0415   MCV 96.6 11/06/2020 0415   MCH 29.8 11/06/2020 0415   MCHC 30.8 11/06/2020 0415   RDW 16.5 (H) 11/06/2020 0415   LYMPHSABS 0.5 (L) 10/28/2020 1140   MONOABS 1.1 (H) 10/28/2020 1140   EOSABS 0.0 10/28/2020 1140   BASOSABS 0.0 10/28/2020 1140   Discharge Instructions: Discharge Instructions    Diet - low sodium heart healthy   Complete by: As directed    Increase activity slowly   Complete by: As directed      Signed: Foy [place], Griffin 11/06/2020, 1:18 PM   Pager: [phone]

## 2020-11-06 NOTE — Plan of Care (Signed)

## 2020-11-06 NOTE — Progress Notes (Signed)
D/C instructions printed and placed in packet at nurse's station. Tele and IV removed, tolerated well.  

## 2020-11-06 NOTE — TOC Transition Note (Signed)
Transition of Care [facility]) - CM/SW Discharge Note   Patient Details  Name: Renee Griffin MRN: [id number] Date of Birth: Oct 07, 1956  Transition of [facility]) CM/SW Contact:  Loreta Ave, [place] Phone Number: 11/06/2020, 3:09 PM   Clinical Narrative:    Patient will DC to: [facility]  Anticipated DC date: 11/06/20 Family notified: Jeannette Corpus  Transport by: Corey Harold   Per MD patient ready for DC to [facility]. RN to call report prior to discharge [phone]. RN, patient, patient's family, and facility notified of DC. Discharge Summary and FL2 sent to facility. DC packet on chart. Ambulance transport requested for patient.   CSW will sign off for now as social work intervention is no longer needed. Please consult [place] again if new needs arise.     Final next level of care: Skilled Nursing Facility Barriers to Discharge: Barriers Resolved   Patient Goals and CMS Choice Patient states their goals for this hospitalization and ongoing recovery are:: Get stronger CMS Medicare.gov Compare Post Acute Care list provided to:: Patient Choice offered to / list presented to : Patient  Discharge Placement                       Discharge Plan and Services In-house Referral: Clinical Social Work   Post Acute Care Choice: [place]          DME Arranged: Environmental consultant tall                    Social Determinants of Health (SDOH) Interventions     Readmission Risk Interventions Readmission Risk Prevention Plan 11/06/2020 05/21/2020 02/13/2020  Transportation Screening Complete Complete Complete  PCP or Specialist Appt within 3-5 Days - Complete Complete  HRI or Home Care Consult - Complete Complete  Social Work Consult for [place] Planning/Counseling - Complete Complete  Palliative Care Screening - Not Applicable Not Applicable  Medication Review Press photographer) Complete Complete Complete  PCP or Specialist appointment within 3-5 days of discharge  Complete - -  [place] or Home Care Consult Complete - -  SW Recovery Care/Counseling Consult Complete - -  Palliative Care Screening Not Applicable - -  Skilled Nursing Facility Complete - -  Some recent data might be hidden

## 2021-06-12 ENCOUNTER — Encounter (HOSPITAL_COMMUNITY): Payer: Self-pay | Admitting: Emergency Medicine

## 2021-06-12 ENCOUNTER — Emergency Department (HOSPITAL_COMMUNITY): Payer: Medicare Other

## 2021-06-12 ENCOUNTER — Inpatient Hospital Stay (HOSPITAL_COMMUNITY)
Admission: EM | Admit: 2021-06-12 | Discharge: 2021-06-25 | DRG: 871 | Disposition: E | Payer: Medicare Other | Attending: Internal Medicine | Admitting: Internal Medicine

## 2021-06-12 ENCOUNTER — Other Ambulatory Visit: Payer: Self-pay

## 2021-06-12 ENCOUNTER — Inpatient Hospital Stay (HOSPITAL_COMMUNITY): Payer: Medicare Other

## 2021-06-12 DIAGNOSIS — E785 Hyperlipidemia, unspecified: Secondary | ICD-10-CM | POA: Diagnosis present

## 2021-06-12 DIAGNOSIS — R579 Shock, unspecified: Secondary | ICD-10-CM | POA: Diagnosis present

## 2021-06-12 DIAGNOSIS — R627 Adult failure to thrive: Secondary | ICD-10-CM | POA: Diagnosis present

## 2021-06-12 DIAGNOSIS — R4182 Altered mental status, unspecified: Secondary | ICD-10-CM | POA: Diagnosis present

## 2021-06-12 DIAGNOSIS — Z87898 Personal history of other specified conditions: Secondary | ICD-10-CM

## 2021-06-12 DIAGNOSIS — G9341 Metabolic encephalopathy: Secondary | ICD-10-CM | POA: Diagnosis present

## 2021-06-12 DIAGNOSIS — R6521 Severe sepsis with septic shock: Secondary | ICD-10-CM | POA: Diagnosis present

## 2021-06-12 DIAGNOSIS — D65 Disseminated intravascular coagulation [defibrination syndrome]: Secondary | ICD-10-CM | POA: Diagnosis present

## 2021-06-12 DIAGNOSIS — Z7401 Bed confinement status: Secondary | ICD-10-CM

## 2021-06-12 DIAGNOSIS — E279 Disorder of adrenal gland, unspecified: Secondary | ICD-10-CM | POA: Diagnosis present

## 2021-06-12 DIAGNOSIS — I130 Hypertensive heart and chronic kidney disease with heart failure and stage 1 through stage 4 chronic kidney disease, or unspecified chronic kidney disease: Secondary | ICD-10-CM | POA: Diagnosis present

## 2021-06-12 DIAGNOSIS — A419 Sepsis, unspecified organism: Principal | ICD-10-CM | POA: Diagnosis present

## 2021-06-12 DIAGNOSIS — Z9981 Dependence on supplemental oxygen: Secondary | ICD-10-CM

## 2021-06-12 DIAGNOSIS — R188 Other ascites: Secondary | ICD-10-CM | POA: Diagnosis present

## 2021-06-12 DIAGNOSIS — J690 Pneumonitis due to inhalation of food and vomit: Secondary | ICD-10-CM | POA: Diagnosis present

## 2021-06-12 DIAGNOSIS — F32A Depression, unspecified: Secondary | ICD-10-CM | POA: Diagnosis present

## 2021-06-12 DIAGNOSIS — Z66 Do not resuscitate: Secondary | ICD-10-CM | POA: Diagnosis present

## 2021-06-12 DIAGNOSIS — C7492 Malignant neoplasm of unspecified part of left adrenal gland: Secondary | ICD-10-CM | POA: Diagnosis present

## 2021-06-12 DIAGNOSIS — G8929 Other chronic pain: Secondary | ICD-10-CM | POA: Diagnosis present

## 2021-06-12 DIAGNOSIS — J9601 Acute respiratory failure with hypoxia: Secondary | ICD-10-CM | POA: Diagnosis present

## 2021-06-12 DIAGNOSIS — I255 Ischemic cardiomyopathy: Secondary | ICD-10-CM | POA: Diagnosis present

## 2021-06-12 DIAGNOSIS — K56699 Other intestinal obstruction unspecified as to partial versus complete obstruction: Secondary | ICD-10-CM

## 2021-06-12 DIAGNOSIS — Z951 Presence of aortocoronary bypass graft: Secondary | ICD-10-CM

## 2021-06-12 DIAGNOSIS — I5022 Chronic systolic (congestive) heart failure: Secondary | ICD-10-CM | POA: Diagnosis present

## 2021-06-12 DIAGNOSIS — R578 Other shock: Secondary | ICD-10-CM | POA: Diagnosis present

## 2021-06-12 DIAGNOSIS — F410 Panic disorder [episodic paroxysmal anxiety] without agoraphobia: Secondary | ICD-10-CM | POA: Diagnosis present

## 2021-06-12 DIAGNOSIS — N184 Chronic kidney disease, stage 4 (severe): Secondary | ICD-10-CM | POA: Diagnosis present

## 2021-06-12 DIAGNOSIS — E7800 Pure hypercholesterolemia, unspecified: Secondary | ICD-10-CM | POA: Diagnosis present

## 2021-06-12 DIAGNOSIS — E875 Hyperkalemia: Secondary | ICD-10-CM | POA: Diagnosis present

## 2021-06-12 DIAGNOSIS — I21A1 Myocardial infarction type 2: Secondary | ICD-10-CM | POA: Diagnosis present

## 2021-06-12 DIAGNOSIS — Z978 Presence of other specified devices: Secondary | ICD-10-CM

## 2021-06-12 DIAGNOSIS — Z880 Allergy status to penicillin: Secondary | ICD-10-CM

## 2021-06-12 DIAGNOSIS — N170 Acute kidney failure with tubular necrosis: Secondary | ICD-10-CM | POA: Diagnosis present

## 2021-06-12 DIAGNOSIS — E1122 Type 2 diabetes mellitus with diabetic chronic kidney disease: Secondary | ICD-10-CM | POA: Diagnosis present

## 2021-06-12 DIAGNOSIS — I4891 Unspecified atrial fibrillation: Secondary | ICD-10-CM | POA: Diagnosis present

## 2021-06-12 DIAGNOSIS — Z9109 Other allergy status, other than to drugs and biological substances: Secondary | ICD-10-CM

## 2021-06-12 DIAGNOSIS — K2950 Unspecified chronic gastritis without bleeding: Secondary | ICD-10-CM | POA: Diagnosis present

## 2021-06-12 DIAGNOSIS — E872 Acidosis: Secondary | ICD-10-CM | POA: Diagnosis present

## 2021-06-12 DIAGNOSIS — I493 Ventricular premature depolarization: Secondary | ICD-10-CM | POA: Diagnosis present

## 2021-06-12 DIAGNOSIS — Z8673 Personal history of transient ischemic attack (TIA), and cerebral infarction without residual deficits: Secondary | ICD-10-CM

## 2021-06-12 DIAGNOSIS — Z888 Allergy status to other drugs, medicaments and biological substances status: Secondary | ICD-10-CM

## 2021-06-12 DIAGNOSIS — Z79899 Other long term (current) drug therapy: Secondary | ICD-10-CM

## 2021-06-12 DIAGNOSIS — Z20822 Contact with and (suspected) exposure to covid-19: Secondary | ICD-10-CM | POA: Diagnosis present

## 2021-06-12 DIAGNOSIS — F17210 Nicotine dependence, cigarettes, uncomplicated: Secondary | ICD-10-CM | POA: Diagnosis present

## 2021-06-12 LAB — BLOOD GAS, ARTERIAL
Acid-base deficit: 25.7 mmol/L — ABNORMAL HIGH (ref 0.0–2.0)
Acid-base deficit: 23.3 mmol/L — ABNORMAL HIGH (ref 0.0–2.0)
Acid-base deficit: 23.4 mmol/L — ABNORMAL HIGH (ref 0.0–2.0)
Acid-base deficit: 21 mmol/L — ABNORMAL HIGH (ref 0.0–2.0)
Bicarbonate: 6.1 mmol/L — ABNORMAL LOW (ref 20.0–28.0)
Bicarbonate: 7.8 mmol/L — ABNORMAL LOW (ref 20.0–28.0)
Bicarbonate: 9 mmol/L — ABNORMAL LOW (ref 20.0–28.0)
Bicarbonate: 10.6 mmol/L — ABNORMAL LOW (ref 20.0–28.0)
Drawn by: 29503
FIO2: 100
FIO2: 100
FIO2: 100
FIO2: 100
MECHVT: 420 mL
MECHVT: 310 mL
MECHVT: 310 mL
O2 Saturation: 97.7 %
O2 Saturation: 96 %
O2 Saturation: 40.9 %
O2 Saturation: 47.5 %
PEEP: 10 cmH2O
PEEP: 10 cmH2O
PEEP: 10 cmH2O
Patient temperature: 98.6
Patient temperature: 98.6
Patient temperature: 93.7
Patient temperature: 94.5
RATE: 18 resp/min
RATE: 30 resp/min
RATE: 30 resp/min
pCO2 arterial: 34.4 mmHg (ref 32.0–48.0)
pCO2 arterial: 38 mmHg (ref 32.0–48.0)
pCO2 arterial: 50.3 mmHg — ABNORMAL HIGH (ref 32.0–48.0)
pCO2 arterial: 51.4 mmHg — ABNORMAL HIGH (ref 32.0–48.0)
pH, Arterial: 6.882 — CL (ref 7.350–7.450)
pH, Arterial: 6.943 — CL (ref 7.350–7.450)
pH, Arterial: 6.884 — CL (ref 7.350–7.450)
pH, Arterial: 6.944 — CL (ref 7.350–7.450)
pO2, Arterial: 217 mmHg — ABNORMAL HIGH (ref 83.0–108.0)
pO2, Arterial: 124 mmHg — ABNORMAL HIGH (ref 83.0–108.0)
pO2, Arterial: 33.2 mmHg — CL (ref 83.0–108.0)
pO2, Arterial: 35.4 mmHg — CL (ref 83.0–108.0)

## 2021-06-12 LAB — CBC WITH DIFFERENTIAL/PLATELET
Abs Immature Granulocytes: 0.1 10*3/uL — ABNORMAL HIGH (ref 0.00–0.07)
Abs Immature Granulocytes: 0.19 10*3/uL — ABNORMAL HIGH (ref 0.00–0.07)
Basophils Absolute: 0 10*3/uL (ref 0.0–0.1)
Basophils Absolute: 0 10*3/uL (ref 0.0–0.1)
Basophils Relative: 0 %
Basophils Relative: 0 %
Eosinophils Absolute: 0 10*3/uL (ref 0.0–0.5)
Eosinophils Absolute: 0 10*3/uL (ref 0.0–0.5)
Eosinophils Relative: 0 %
Eosinophils Relative: 0 %
HCT: 33.6 % — ABNORMAL LOW (ref 36.0–46.0)
HCT: 31.7 % — ABNORMAL LOW (ref 36.0–46.0)
Hemoglobin: 9.5 g/dL — ABNORMAL LOW (ref 12.0–15.0)
Hemoglobin: 9.2 g/dL — ABNORMAL LOW (ref 12.0–15.0)
Immature Granulocytes: 1 %
Immature Granulocytes: 1 %
Lymphocytes Relative: 3 %
Lymphocytes Relative: 2 %
Lymphs Abs: 0.3 10*3/uL — ABNORMAL LOW (ref 0.7–4.0)
Lymphs Abs: 0.3 10*3/uL — ABNORMAL LOW (ref 0.7–4.0)
MCH: 28.2 pg (ref 26.0–34.0)
MCH: 28.2 pg (ref 26.0–34.0)
MCHC: 28.3 g/dL — ABNORMAL LOW (ref 30.0–36.0)
MCHC: 29 g/dL — ABNORMAL LOW (ref 30.0–36.0)
MCV: 99.7 fL (ref 80.0–100.0)
MCV: 97.2 fL (ref 80.0–100.0)
Monocytes Absolute: 0.4 10*3/uL (ref 0.1–1.0)
Monocytes Absolute: 0.5 10*3/uL (ref 0.1–1.0)
Monocytes Relative: 3 %
Monocytes Relative: 3 %
Neutro Abs: 12.4 10*3/uL — ABNORMAL HIGH (ref 1.7–7.7)
Neutro Abs: 16.1 10*3/uL — ABNORMAL HIGH (ref 1.7–7.7)
Neutrophils Relative %: 93 %
Neutrophils Relative %: 94 %
Platelets: 298 10*3/uL (ref 150–400)
Platelets: 308 10*3/uL (ref 150–400)
RBC: 3.37 MIL/uL — ABNORMAL LOW (ref 3.87–5.11)
RBC: 3.26 MIL/uL — ABNORMAL LOW (ref 3.87–5.11)
RDW: 17.3 % — ABNORMAL HIGH (ref 11.5–15.5)
RDW: 17.4 % — ABNORMAL HIGH (ref 11.5–15.5)
WBC: 13.2 10*3/uL — ABNORMAL HIGH (ref 4.0–10.5)
WBC: 17.1 10*3/uL — ABNORMAL HIGH (ref 4.0–10.5)
nRBC: 0 % (ref 0.0–0.2)
nRBC: 0 % (ref 0.0–0.2)

## 2021-06-12 LAB — PHOSPHORUS: Phosphorus: 8.3 mg/dL — ABNORMAL HIGH (ref 2.5–4.6)

## 2021-06-12 LAB — RESP PANEL BY RT-PCR (FLU A&B, COVID) ARPGX2
Influenza A by PCR: NEGATIVE
Influenza B by PCR: NEGATIVE
SARS Coronavirus 2 by RT PCR: NEGATIVE

## 2021-06-12 LAB — LACTIC ACID, PLASMA
Lactic Acid, Venous: 3.8 mmol/L (ref 0.5–1.9)
Lactic Acid, Venous: 2.7 mmol/L (ref 0.5–1.9)

## 2021-06-12 LAB — LIPASE, BLOOD: Lipase: 16 U/L (ref 11–51)

## 2021-06-12 LAB — COMPREHENSIVE METABOLIC PANEL
ALT: 13 U/L (ref 0–44)
ALT: 20 U/L (ref 0–44)
AST: 41 U/L (ref 15–41)
AST: 76 U/L — ABNORMAL HIGH (ref 15–41)
Albumin: 2.4 g/dL — ABNORMAL LOW (ref 3.5–5.0)
Albumin: 2.1 g/dL — ABNORMAL LOW (ref 3.5–5.0)
Alkaline Phosphatase: 76 U/L (ref 38–126)
Alkaline Phosphatase: 67 U/L (ref 38–126)
Anion gap: 19 — ABNORMAL HIGH (ref 5–15)
Anion gap: 18 — ABNORMAL HIGH (ref 5–15)
BUN: 88 mg/dL — ABNORMAL HIGH (ref 8–23)
BUN: 82 mg/dL — ABNORMAL HIGH (ref 8–23)
CO2: 8 mmol/L — ABNORMAL LOW (ref 22–32)
CO2: 10 mmol/L — ABNORMAL LOW (ref 22–32)
Calcium: 9.1 mg/dL (ref 8.9–10.3)
Calcium: 7.9 mg/dL — ABNORMAL LOW (ref 8.9–10.3)
Chloride: 108 mmol/L (ref 98–111)
Chloride: 108 mmol/L (ref 98–111)
Creatinine, Ser: 5.85 mg/dL — ABNORMAL HIGH (ref 0.44–1.00)
Creatinine, Ser: 5.3 mg/dL — ABNORMAL HIGH (ref 0.44–1.00)
GFR, Estimated: 8 mL/min — ABNORMAL LOW (ref >60)
GFR, Estimated: 8 mL/min — ABNORMAL LOW (ref >60)
Glucose, Bld: 147 mg/dL — ABNORMAL HIGH (ref 70–99)
Glucose, Bld: 242 mg/dL — ABNORMAL HIGH (ref 70–99)
Potassium: 5.4 mmol/L — ABNORMAL HIGH (ref 3.5–5.1)
Potassium: 4.7 mmol/L (ref 3.5–5.1)
Sodium: 135 mmol/L (ref 135–145)
Sodium: 136 mmol/L (ref 135–145)
Total Bilirubin: 1 mg/dL (ref 0.3–1.2)
Total Bilirubin: 0.9 mg/dL (ref 0.3–1.2)
Total Protein: 5.3 g/dL — ABNORMAL LOW (ref 6.5–8.1)
Total Protein: 4.4 g/dL — ABNORMAL LOW (ref 6.5–8.1)

## 2021-06-12 LAB — PROCALCITONIN: Procalcitonin: <0.1 ng/mL

## 2021-06-12 LAB — MAGNESIUM: Magnesium: 2.1 mg/dL (ref 1.7–2.4)

## 2021-06-12 LAB — GLUCOSE, CAPILLARY
Glucose-Capillary: 133 mg/dL — ABNORMAL HIGH (ref 70–99)
Glucose-Capillary: 157 mg/dL — ABNORMAL HIGH (ref 70–99)

## 2021-06-12 LAB — PROTIME-INR
INR: 2.5 — ABNORMAL HIGH (ref 0.8–1.2)
INR: 2.7 — ABNORMAL HIGH (ref 0.8–1.2)
Prothrombin Time: 27 seconds — ABNORMAL HIGH (ref 11.4–15.2)
Prothrombin Time: 28.6 seconds — ABNORMAL HIGH (ref 11.4–15.2)

## 2021-06-12 LAB — DIC (DISSEMINATED INTRAVASCULAR COAGULATION)PANEL
D-Dimer, Quant: 3.25 ug/mL-FEU — ABNORMAL HIGH (ref 0.00–0.50)
Fibrinogen: 171 mg/dL — ABNORMAL LOW (ref 210–475)
INR: 2.8 — ABNORMAL HIGH (ref 0.8–1.2)
Platelets: 308 10*3/uL (ref 150–400)
Prothrombin Time: 29.1 seconds — ABNORMAL HIGH (ref 11.4–15.2)
aPTT: 36 seconds (ref 24–36)

## 2021-06-12 LAB — TROPONIN I (HIGH SENSITIVITY): Troponin I (High Sensitivity): 6692 ng/L (ref <18)

## 2021-06-12 LAB — AMYLASE: Amylase: 52 U/L (ref 28–100)

## 2021-06-12 LAB — MRSA PCR SCREENING: MRSA by PCR: POSITIVE — AB

## 2021-06-12 MED ORDER — MUPIROCIN 2 % EX OINT: 1.0000 "application " | TOPICAL_OINTMENT | Freq: Two times a day (BID) | CUTANEOUS | Status: DC

## 2021-06-12 MED ORDER — ROCURONIUM BROMIDE 10 MG/ML (PF) SYRINGE
PREFILLED_SYRINGE | INTRAVENOUS | Status: AC
  Filled 2021-06-12: qty 10

## 2021-06-12 MED ORDER — SODIUM BICARBONATE 8.4 % IV SOLN
100.0000 meq | Freq: Once | INTRAVENOUS | Status: AC
  Administered 2021-06-12: 100 meq via INTRAVENOUS

## 2021-06-12 MED ORDER — FENTANYL 2500MCG IN NS 250ML (10MCG/ML) PREMIX INFUSION
0.0000 ug/h | INTRAVENOUS | Status: DC
  Administered 2021-06-12: 25 ug/h via INTRAVENOUS
  Filled 2021-06-12: qty 250

## 2021-06-12 MED ORDER — DOCUSATE SODIUM 50 MG/5ML PO LIQD: 100.0000 mg | Freq: Two times a day (BID) | ORAL | Status: DC

## 2021-06-12 MED ORDER — SODIUM BICARBONATE 8.4 % IV SOLN
INTRAVENOUS | Status: AC
  Administered 2021-06-12: 100 meq via INTRAVENOUS
  Filled 2021-06-12: qty 50

## 2021-06-12 MED ORDER — SUCCINYLCHOLINE CHLORIDE 200 MG/10ML IV SOSY
PREFILLED_SYRINGE | INTRAVENOUS | Status: AC
  Filled 2021-06-12: qty 10

## 2021-06-12 MED ORDER — SODIUM BICARBONATE 8.4 % IV SOLN
100.0000 meq | Freq: Once | INTRAVENOUS | Status: AC
  Filled 2021-06-12: qty 50

## 2021-06-12 MED ORDER — NOREPINEPHRINE 4 MG/250ML-% IV SOLN
2.0000 ug/min | INTRAVENOUS | Status: DC
Start: 2021-06-12 — End: 2021-06-12

## 2021-06-12 MED ORDER — VANCOMYCIN VARIABLE DOSE PER UNSTABLE RENAL FUNCTION (PHARMACIST DOSING): Status: DC

## 2021-06-12 MED ORDER — VASOPRESSIN 20 UNITS/100 ML INFUSION FOR SHOCK
0.0000 [IU]/min | INTRAVENOUS | Status: DC
  Administered 2021-06-12: 0.03 [IU]/min via INTRAVENOUS
  Filled 2021-06-12 (×2): qty 100

## 2021-06-12 MED ORDER — POLYETHYLENE GLYCOL 3350 17 G PO PACK: 17.0000 g | PACK | Freq: Every day | ORAL | Status: DC

## 2021-06-12 MED ORDER — SODIUM BICARBONATE 8.4 % IV SOLN
INTRAVENOUS | Status: AC
  Filled 2021-06-12: qty 50

## 2021-06-12 MED ORDER — MIDAZOLAM HCL 2 MG/2ML IJ SOLN: 2.0000 mg | INTRAMUSCULAR | Status: DC | PRN

## 2021-06-12 MED ORDER — PHENYLEPHRINE CONCENTRATED 100MG/250ML (0.4 MG/ML) INFUSION SIMPLE
0.0000 ug/min | INTRAVENOUS | Status: DC
  Administered 2021-06-12 (×2): 400 ug/min via INTRAVENOUS
  Filled 2021-06-12 (×4): qty 250

## 2021-06-12 MED ORDER — FENTANYL CITRATE (PF) 100 MCG/2ML IJ SOLN: 50.0000 ug | INTRAMUSCULAR | Status: DC | PRN

## 2021-06-12 MED ORDER — NOREPINEPHRINE 16 MG/250ML-% IV SOLN: 2.0000 ug/min | INTRAVENOUS | Status: DC

## 2021-06-12 MED ORDER — SODIUM BICARBONATE 8.4 % IV SOLN
INTRAVENOUS | Status: AC
  Filled 2021-06-12: qty 100

## 2021-06-12 MED ORDER — POLYETHYLENE GLYCOL 3350 17 G PO PACK: 17.0000 g | PACK | Freq: Every day | ORAL | Status: DC | PRN

## 2021-06-12 MED ORDER — LIDOCAINE HCL (CARDIAC) PF 100 MG/5ML IV SOSY
PREFILLED_SYRINGE | INTRAVENOUS | Status: AC
  Filled 2021-06-12: qty 5

## 2021-06-12 MED ORDER — SODIUM CHLORIDE 0.9 % IV SOLN: 1.0000 g | INTRAVENOUS | Status: DC

## 2021-06-12 MED ORDER — SODIUM BICARBONATE 8.4 % IV SOLN
INTRAVENOUS | Status: DC
  Filled 2021-06-12: qty 150
  Filled 2021-06-12: qty 1000

## 2021-06-12 MED ORDER — SODIUM CHLORIDE 0.9 % IV SOLN
2.0000 g | Freq: Once | INTRAVENOUS | Status: AC
  Administered 2021-06-12: 2 g via INTRAVENOUS
  Filled 2021-06-12: qty 2

## 2021-06-12 MED ORDER — NOREPINEPHRINE 4 MG/250ML-% IV SOLN
2.0000 ug/min | INTRAVENOUS | Status: DC
  Administered 2021-06-12 (×2): 8 ug/min via INTRAVENOUS
  Filled 2021-06-12: qty 250

## 2021-06-12 MED ORDER — DEXTROSE IN LACTATED RINGERS 5 % IV SOLN: INTRAVENOUS | Status: DC

## 2021-06-12 MED ORDER — CHLORHEXIDINE GLUCONATE 0.12% ORAL RINSE (MEDLINE KIT): 15.0000 mL | Freq: Two times a day (BID) | OROMUCOSAL | Status: DC

## 2021-06-12 MED ORDER — PANTOPRAZOLE SODIUM 40 MG IV SOLR: 40.0000 mg | Freq: Every day | INTRAVENOUS | Status: DC

## 2021-06-12 MED ORDER — STERILE WATER FOR INJECTION IJ SOLN
INTRAMUSCULAR | Status: AC
  Filled 2021-06-12: qty 10

## 2021-06-12 MED ORDER — ETOMIDATE 2 MG/ML IV SOLN
INTRAVENOUS | Status: AC
  Filled 2021-06-12: qty 20

## 2021-06-12 MED ORDER — NOREPINEPHRINE 4 MG/250ML-% IV SOLN
INTRAVENOUS | Status: AC
  Administered 2021-06-12: 93.8 mg
  Filled 2021-06-12: qty 250

## 2021-06-12 MED ORDER — VECURONIUM BROMIDE 10 MG IV SOLR
INTRAVENOUS | Status: AC
  Filled 2021-06-12: qty 10

## 2021-06-12 MED ORDER — PHENYLEPHRINE HCL-NACL 10-0.9 MG/250ML-% IV SOLN
0.0000 ug/min | INTRAVENOUS | Status: DC
  Administered 2021-06-12: 220 ug/min via INTRAVENOUS
  Administered 2021-06-12: 20 ug/min via INTRAVENOUS
  Administered 2021-06-12: 130 ug/min via INTRAVENOUS
  Filled 2021-06-12: qty 250
  Filled 2021-06-12: qty 500

## 2021-06-12 MED ORDER — ORAL CARE MOUTH RINSE: 15.0000 mL | OROMUCOSAL | Status: DC

## 2021-06-12 MED ORDER — SODIUM CHLORIDE 0.9 % IV SOLN: 250.0000 mL | INTRAVENOUS | Status: DC

## 2021-06-12 MED ORDER — NOREPINEPHRINE 4 MG/250ML-% IV SOLN: 0.0000 ug/min | INTRAVENOUS | Status: DC

## 2021-06-12 MED ORDER — CHLORHEXIDINE GLUCONATE CLOTH 2 % EX PADS: 6.0000 | MEDICATED_PAD | Freq: Every day | CUTANEOUS | Status: DC

## 2021-06-12 MED ORDER — INSULIN ASPART 100 UNIT/ML IJ SOLN: 0.0000 [IU] | INTRAMUSCULAR | Status: DC

## 2021-06-12 MED ORDER — SODIUM CHLORIDE 0.9 % IV BOLUS
30.0000 mL/kg | Freq: Once | INTRAVENOUS | Status: AC
Start: 2021-06-12 — End: 2021-06-12
  Administered 2021-06-12: 2310 mL via INTRAVENOUS

## 2021-06-12 MED ORDER — VANCOMYCIN HCL 1000 MG/200ML IV SOLN
1000.0000 mg | Freq: Once | INTRAVENOUS | Status: AC
  Administered 2021-06-12: 1000 mg via INTRAVENOUS
  Filled 2021-06-12: qty 200

## 2021-06-13 LAB — CK TOTAL AND CKMB (NOT AT ARMC)
CK, MB: 72.6 ng/mL — ABNORMAL HIGH (ref 0.5–5.0)
Relative Index: 29.8 — ABNORMAL HIGH (ref 0.0–2.5)
Total CK: 244 U/L — ABNORMAL HIGH (ref 38–234)

## 2021-06-13 LAB — HIV ANTIBODY (ROUTINE TESTING W REFLEX): HIV Screen 4th Generation wRfx: NONREACTIVE

## 2021-06-13 NOTE — Progress Notes (Signed)
Remaining fentanyl bag in room wasted in STERICYCLE 225 mL Laury Axon RN witnessed

## 2021-06-16 NOTE — Discharge Summary (Addendum)
DISCHARGE SUMMARY    Date of admit: 07/01/21 12:57 PM Date of discharge: 06/13/2021  3:00 AM Length of Stay: 1 days  PCP is Dixie Dials, MD  CAUSE(S) OF DEATH  DIC due to sepsis versus malignancy resulting in circulatory shock and acidosis and Acute Tubular Necrosis and respiratory failure and enceophalopathy    PROBLEM LIST Active Problems: DIC - at admission Shock circulatory [facility]) present on admission DNR at admission New onset ascites at admission with baseline adrenal mass suspect malignancy Anemia of chronic disease Hyperkalemia at admission Acute tubular necrosis/acute renal failure at admission Metabolic acidosis and lactic acidosis Likely aspiration pneumonia-present on admission Type II non-STEMI -present on admission Ventricular ectopy is present on admission Acute hypoxemic respiratory failure status post intubation present on admission Coma/acute metabolic encephalopathy-present on admission  History of chronic systolic heart failure and ischemic cardiomyopathy-present on admission Bedbound status-present at admission Failure to Thrive - present at admission SNF resident-present at admission History of depression-present at baseline History of hypertension-present at baseline History of hemorrhagic shock due to retroperitoneal hemorrhage-in November 2021 History of chronic gastritis 2019 History of 14 cm left adrenal mass Diabetes mellitus present at admission     ARILYNN BLAKENEY was 65 y.o. patient with    has a past medical history of Depression, Diabetes mellitus without complication ([place]), Heart disease, congenital, High cholesterol, Hypertension, Panic attack, and Stroke ([place]).   has a past surgical history that includes Cardiac surgery (2011); Wisdom tooth extraction; Esophagogastroduodenoscopy (egd) with propofol (Left, 02/05/2018); Colonoscopy with propofol (N/A, 02/08/2018); Colonoscopy with propofol (N/A, 12/16/2018); IR US Guide Vasc  Access Right (12/18/2018); IR Angiogram Selective Each Additional Vessel (12/18/2018); IR Angiogram Selective Each Additional Vessel (12/18/2018); IR Angiogram Visceral Selective (12/18/2018); IR EMBO ART  VEN HEMORR LYMPH EXTRAV  INC GUIDE ROADMAPPING (12/18/2018); IR Angiogram Selective Each Additional Vessel (12/18/2018); IR Angiogram Selective Each Additional Vessel (12/18/2018); Dilation and evacuation; and LEFT HEART CATH AND CORONARY ANGIOGRAPHY (N/A, 02/10/2020).   Admitted on July 01, 2021 with   65 year old lady widow with a brother is the healthcare power of attorney.  Was living in an apartment complex till November 2021.  Baseline as of November 2021 was depression, stroke history not otherwise specified hyperlipidemia, anxiety, hypertension, atrial fibrillation, smoking history then admitted for 11 days early November 2021.  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  Also had atrial fibrillation with RVR that was resolved.  After that she was discharged to skilled nursing facility where she has resided.  At the nursing home she has been bedbound because of chronic pain but conversant and able to eat orally.  The brother reported her creatinine had significantly improved but overall being bed bound she had failure to thrive that persisteed   She presented on 2021-07-01 with coffee-ground emesis altered mental status and presence of stool in her mouth to the emergency department with obtunded acute encephalopathy and circulatory shock.  She was emergently intubated had left IJ central line.  Post intubation she was seen to be oozing blood from her nostrils central line insertion site and also vaginal area.  Profoundly acidotic metabolic respiratory acidosis and refractory septic shock and acute on acute chronic kidney failure with creatinine in the5s/.  Despite fluid bolus and  maximum Levophed she remained in refractory shock requiring starting of vasopressin and bicarbonate   CCM was called for admission.  Of note back in November 2021 patient  was discharged as a DNR.  However her most recent MOST form said full code.  In talking to the [facility] given the current prognosis shared decision making based best interest is also substituted judgment DNR was made at admission.   CT head did not show any acute changes other than chronic ischemic changes.  CT chest showed 4.1 cm right lower lobe consolidation versus masslike opacity but he did have air bronchograms.  This finding is new CT abdomen showed new onset of large volume ascites [previously small] and also 14 cm left adrenal mass slightly larger than before..  There is also 9.2 cm cystic mass in the pelvis unchanged from before.    COURSE  -= We had planned for a paracentesis but she had progressive circulatory shock with refractory acidosis.  And she passed away the same day 2021/07/02      SIGNED Dr. Brand Males, M.D., [facility], [place].C.P Pulmonary and Critical Care Medicine Staff Physician [place] [facility] Pager: [phone], If no answer or between  15:00h - 7:00h: call [phone]  06/16/2021 12:36 PM

## 2021-06-17 LAB — CULTURE, BLOOD (ROUTINE X 2): Culture: NO GROWTH

## 2021-06-25 NOTE — ED Notes (Signed)
Provider asked for ET tube to be pulled back 2.5cm, RT made aware

## 2021-06-25 NOTE — Progress Notes (Signed)
Pharmacy Antibiotic Note  Renee Griffin is a 65 y.o. female admitted on 2021/07/06 with sepsis.  Pharmacy has been consulted for vanc/cefepime dosing.  Plan: Vancomycin 1g IV x 1 then will redose based on vanc levels due to renal failure Cefepime 2g x 1 already given then 1g IV q24 thereafter per current renal function   Height: 5\' 3"  (160 cm) Weight: 77 kg (169 lb 12.1 oz) IBW/kg (Calculated) : 52.4  Temp (24hrs), Avg:95.4 F (35.2 C), Min:95.4 F (35.2 C), Max:95.4 F (35.2 C)  Recent Labs  Lab 06-Jul-2021 1321  WBC 13.2*  CREATININE 5.85*  LATICACIDVEN 3.8*    Estimated Creatinine Clearance: 9.4 mL/min (A) (by C-G formula based on SCr of 5.85 mg/dL (H)).    Allergies  Allergen Reactions   Amoxicillin-Pot Clavulanate Diarrhea    Has patient had a PCN reaction causing immediate rash, facial/tongue/throat swelling, SOB or lightheadedness with hypotension: Unknown Has patient had a PCN reaction causing severe rash involving mucus membranes or skin necrosis:UnknoWN Has patient had a PCN reaction that required hospitalization: Unknown Has patient had a PCN reaction occurring within the last 10 years: Unknown If all of the above answers are "NO", then may proceed with Cephalosporin use.    Bupropion Nausea Only   Nicotine Nausea Only and Rash    Reaction to patches     Thank you for allowing pharmacy to be a part of this patient's care.  Kara Mead Jul 06, 2021 5:24 PM

## 2021-06-25 NOTE — Progress Notes (Signed)
3 Rns attempted foley unsuccessfully, bladder scan reads >700. MD Ramaswamy aware

## 2021-06-25 NOTE — ED Notes (Addendum)
Per provider's verbal order, Levophed started at 5 mcg/min at this time d/t pt's hypotensive BP.

## 2021-06-25 NOTE — H&P (Addendum)
NAME:  Renee Griffin, MRN:  [id number], DOB:  1956/03/03, LOS: 0 ADMISSION DATE:  2021/07/11, CONSULTATION DATE:  Jul 11, 2021 REFERRING MD:  Dr Vergia Alberts of [facility], CHIEF COMPLAINT:  Circulatory shock, acidodis   History of Present Illness:   65 year old lady widow with a brother is the healthcare power of attorney.  Was living in an apartment complex till November 2021.  Baseline as of November 2021 was depression, stroke history not otherwise specified hyperlipidemia, anxiety, hypertension, atrial fibrillation, smoking history then admitted for 11 days early November 2021.  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  Also had atrial fibrillation with RVR that was resolved.  After that she was discharged to skilled nursing facility where she has resided.  At the nursing home she has been bedbound because of chronic pain but conversant and able to eat orally.  The brother reported her creatinine had significantly improved  She presented on 07/11/21 with coffee-ground emesis altered mental status and presence of stool in her mouth to the emergency department with obtunded acute encephalopathy and circulatory shock.  She was emergently intubated had left IJ central line.  Post intubation she was seen to be oozing blood from her nostrils central line insertion site and also vaginal area.  Profoundly acidotic metabolic respiratory acidosis and refractory septic shock and acute on acute chronic kidney failure with creatinine in the5s/.  Despite fluid bolus and maximum Levophed she remained in refractory shock requiring starting of vasopressin and bicarbonate  CCM was called for admission.  Of note back in November 2021 patient was discharged as a DNR.  However her most recent MOST form said full code.  In talking to the [facility] given the current prognosis shared decision making based best  interest is also substituted judgment DNR was made at admission.  CT head did not show any acute changes other than chronic ischemic changes.  CT chest showed 4.1 cm right lower lobe consolidation versus masslike opacity but he did have air bronchograms.  This finding is new CT abdomen showed new onset of large volume ascites [previously small] and also 14 cm left adrenal mass slightly larger than before..  There is also 9.2 cm cystic mass in the pelvis unchanged from before.   Pertinent  Medical History     has a past medical history of Depression, Diabetes mellitus without complication ([place]), Heart disease, congenital, High cholesterol, Hypertension, Panic attack, and Stroke ([place]).   reports that she has been smoking cigarettes. She has a 50.00 pack-year smoking history. She has never used smokeless tobacco.  Past Surgical History:  Procedure Laterality Date   CARDIAC SURGERY  2011   CABG   COLONOSCOPY WITH PROPOFOL N/A 02/08/2018   Procedure: COLONOSCOPY WITH PROPOFOL;  Surgeon: Otis Brace, MD;  Location: [facility];  Service: Gastroenterology;  Laterality: N/A;   COLONOSCOPY WITH PROPOFOL N/A 12/16/2018   Procedure: COLONOSCOPY WITH PROPOFOL;  Surgeon: Ronald Lobo, MD;  Location: [facility];  Service: Endoscopy;  Laterality: N/A;   DILATION AND EVACUATION     Patient states she had an abortion 30 or 40 years ago   ESOPHAGOGASTRODUODENOSCOPY (EGD) WITH PROPOFOL Left 02/05/2018   Procedure: ESOPHAGOGASTRODUODENOSCOPY (EGD) WITH PROPOFOL;  Surgeon: Otis Brace, MD;  Location: [facility];  Service: Gastroenterology;  Laterality: Left;   IR ANGIOGRAM SELECTIVE EACH ADDITIONAL VESSEL  12/18/2018   IR ANGIOGRAM SELECTIVE EACH ADDITIONAL VESSEL  12/18/2018  IR ANGIOGRAM SELECTIVE EACH ADDITIONAL VESSEL  12/18/2018   IR ANGIOGRAM SELECTIVE EACH ADDITIONAL VESSEL  12/18/2018   IR ANGIOGRAM VISCERAL SELECTIVE  12/18/2018   IR EMBO ART  VEN HEMORR LYMPH EXTRAV  INC GUIDE  ROADMAPPING  12/18/2018   IR US GUIDE VASC ACCESS RIGHT  12/18/2018   LEFT HEART CATH AND CORONARY ANGIOGRAPHY N/A 02/10/2020   Procedure: LEFT HEART CATH AND CORONARY ANGIOGRAPHY;  Surgeon: Dixie Dials, MD;  Location: [facility];  Service: Cardiovascular;  Laterality: N/A;   WISDOM TOOTH EXTRACTION      Allergies  Allergen Reactions   Amoxicillin-Pot Clavulanate Diarrhea    Has patient had a PCN reaction causing immediate rash, facial/tongue/throat swelling, SOB or lightheadedness with hypotension: Unknown Has patient had a PCN reaction causing severe rash involving mucus membranes or skin necrosis:UnknoWN Has patient had a PCN reaction that required hospitalization: Unknown Has patient had a PCN reaction occurring within the last 10 years: Unknown If all of the above answers are "NO", then may proceed with Cephalosporin use.    Bupropion Nausea Only   Nicotine Nausea Only and Rash    Reaction to patches    Immunization History  Administered Date(s) Administered   PFIZER(Purple Top)SARS-COV-2 Vaccination 04/04/2020, 05/02/2020   Pneumococcal Polysaccharide-23 06/04/2012    Family History  Problem Relation Age of Onset   Cancer Mother        Theadora Rama OR cervical   Cancer Father        lung   Leukemia Brother    Cancer Sister        breast     Current Facility-Administered Medications:    0.9 %  sodium chloride infusion, 250 mL, Intravenous, Continuous, Hong, Greggory Brandy, MD   fentaNYL 2574mcg in NS 215mL (1mcg/ml) infusion-PREMIX, 0-400 mcg/hr, Intravenous, Continuous, Hong, Greggory Brandy, MD, Last Rate: 2.5 mL/hr at 06/22/2021 1509, 25 mcg/hr at 06/22/21 1509   norepinephrine (LEVOPHED) 4mg  in 211mL premix infusion, 2-10 mcg/min, Intravenous, Titrated, Brand Males, MD, Last Rate: 30 mL/hr at Jun 22, 2021 1517, 8 mcg/min at June 22, 2021 1517   phenylephrine (NEOSYNEPHRINE) 10-0.9 MG/250ML-% infusion, 0-400 mcg/min, Intravenous, Titrated, Sula Fetterly, MD   rocuronium  bromide 100 MG/10ML SOSY, , , ,    sodium bicarbonate 150 mEq in dextrose 5 % 1,150 mL infusion, , Intravenous, Continuous, Hong, Greggory Brandy, MD   vasopressin (PITRESSIN) 20 Units in sodium chloride 0.9 % 100 mL infusion-*FOR SHOCK*, 0-0.03 Units/min, Intravenous, Continuous, Hong, Greggory Brandy, MD, Last Rate: 9 mL/hr at 2021-06-22 1515, 0.03 Units/min at June 22, 2021 1515  Current Outpatient Medications:    acetaminophen (TYLENOL) 325 MG tablet, Take 2 tablets (650 mg total) by mouth every 4 (four) hours as needed for headache or mild pain., Disp:  , Rfl:    albuterol (VENTOLIN HFA) 108 (90 Base) MCG/ACT inhaler, Inhale 2 puffs into the lungs every 6 (six) hours as needed for wheezing or shortness of breath., Disp: 18 g, Rfl: 12   ALPRAZolam (XANAX) 0.25 MG tablet, Take 1 tablet (0.25 mg total) by mouth every 8 (eight) hours. (Patient taking differently: Take 0.25 mg by mouth 2 (two) times daily.), Disp: 30 tablet, Rfl: 0   cholecalciferol (VITAMIN D3) 25 MCG (1000 UNIT) tablet, Take 1 tablet (1,000 Units total) by mouth daily., Disp: 30 tablet, Rfl: 0   ferrous sulfate 325 (65 FE) MG tablet, Take 1 tablet (325 mg total) by mouth daily with breakfast., Disp: 30 tablet, Rfl: 0   fluticasone (FLONASE) 50 MCG/ACT nasal spray, Place 2  sprays into both nostrils daily., Disp: , Rfl:    gemfibrozil (LOPID) 600 MG tablet, Take 600 mg by mouth 2 (two) times daily., Disp: , Rfl: 2   Infant Care Products (DERMACLOUD) OINT, Apply 1 application topically 2 (two) times daily., Disp: , Rfl:    metoprolol tartrate (LOPRESSOR) 25 MG tablet, Take 1 tablet (25 mg total) by mouth 2 (two) times daily., Disp: , Rfl:    Multiple Vitamin (MULTIVITAMIN WITH MINERALS) TABS tablet, Take 1 tablet by mouth daily., Disp: , Rfl:    ondansetron (ZOFRAN) 4 MG tablet, Take 4 mg by mouth See admin instructions. Take 1 tablet by mouth three times daily. May take 1 tablet by mouth every 8 hours as needed for nausea., Disp: , Rfl:    OXYGEN, Inhale  2 L into the lungs continuous., Disp: , Rfl:    prazosin (MINIPRESS) 2 MG capsule, Take 2 capsules (4 mg total) by mouth 3 (three) times daily., Disp: , Rfl:    saccharomyces boulardii (FLORASTOR) 250 MG capsule, Take 250 mg by mouth 2 (two) times daily., Disp: , Rfl:    sertraline (ZOLOFT) 100 MG tablet, Take 100 mg by mouth at bedtime. , Disp: , Rfl:     Significant Hospital Events: Including procedures, antibiotic start and stop dates in addition to other pertinent events   2021/06/14: Admitted via [facility] emergency department.  Intubated, left IJ central line placed in the ED  Interim History / Subjective:  06-14-2021: Seen in the emergency department by CCM.  Objective   Blood pressure (!) 76/64, pulse (!) 105, temperature (!) 95.4 F (35.2 C), temperature source Rectal, resp. rate 13, height 5\' 3"  (1.6 m), weight 77 kg, SpO2 (!) 77 %.    Vent Mode: PRVC FiO2 (%):  [100 %] 100 % Set Rate:  [18 bmp] 18 bmp Vt Set:  [420 mL] 420 mL PEEP:  [10 cmH20] 10 cmH20 Plateau Pressure:  [17 cmH20] 17 cmH20   Intake/Output Summary (Last 24 hours) at June 14, 2021 1540 Last data filed at 06/14/2021 1518 Gross per 24 hour  Intake 3000 ml  Output --  Net 3000 ml   Filed Weights   2021/06/14 1314  Weight: 77 kg    Examination: Comatose morbid looking female losing blood through the left nostril and also the vaginal area and also left IJ.  Synchronous with the ventilator.  High FiO2 needs.  Abdomen looks distended with a ventral hernia and everted umbilicus but no rebound or rigidity.  Tachycardic with a lot of ectopy on the monitor.  Lying flat on the stretcher in the emergency department.  Labs/imaging that I havepersonally reviewed  (right click and "Reselect all SmartList Selections" daily)     LABS    PULMONARY Recent Labs  Lab 14-Jun-2021 1442  PHART 6.882*  PCO2ART 34.4  PO2ART 217*  HCO3 6.1*  O2SAT 97.7    CBC Recent Labs  Lab June 14, 2021 1321  HGB 9.5*  HCT 33.6*   WBC 13.2*  PLT 298    COAGULATION Recent Labs  Lab 06/14/2021 1321  INR 2.5*    CARDIAC  No results for input(s): TROPONINI in the last 168 hours. No results for input(s): PROBNP in the last 168 hours.   CHEMISTRY Recent Labs  Lab June 14, 2021 1321  NA 135  K 5.4*  CL 108  CO2 8*  GLUCOSE 147*  BUN 88*  CREATININE 5.85*  CALCIUM 9.1   Estimated Creatinine Clearance: 9.4 mL/min (A) (by C-G formula based on SCr  of 5.85 mg/dL (H)).   LIVER Recent Labs  Lab Jun 30, 2021 1321  AST 41  ALT 13  ALKPHOS 76  BILITOT 1.0  PROT 5.3*  ALBUMIN 2.4*  INR 2.5*     INFECTIOUS Recent Labs  Lab 2021/06/30 1321  LATICACIDVEN 3.8*     ENDOCRINE CBG (last 3)  No results for input(s): GLUCAP in the last 72 hours.       IMAGING x48h  - image(s) personally visualized  -   highlighted in bold DG Chest Port 1 View  Result Date: June 30, 2021 CLINICAL DATA:  Sepsis. Status post OG tube, intubation and central line placement. EXAM: PORTABLE CHEST 1 VIEW COMPARISON:  Prior chest radiograph 10/30/2020. FINDINGS: Left IJ approach central venous catheter with tip projecting in the region of the brachiocephalic confluence. The ET tube extends slightly into the right mainstem bronchus, just distal to the carina. An enteric tube passes below level left hemidiaphragm with tip projecting in the region of the stomach. Marked cardiomegaly unchanged. Prior median sternotomy. Evaluation of the right lung base is limited by patient rotation and low lung volumes with superimposition of the cardiac and liver soft tissues. Within this limitation, there is no appreciable airspace consolidation or pulmonary edema. No evidence of pleural effusion or pneumothorax. No acute bony abnormality identified. Impressions 1 and 2 were called by telephone at the time of interpretation on 06-30-2021 at 3:15 pm to provider Dr. Benson Norway, Who verbally acknowledged these results. IMPRESSION: The ET tube extends slightly into the  right mainstem bronchus. A left IJ approach central venous catheter is present with tip projecting in the expected location of the brachiocephalic vein confluence. Enteric tube present with tip projecting in the region of the stomach. Evaluation of the right lung base is limited by patient rotation and low lung volumes with superimposition of the cardiac and liver soft tissues. Within this limitation, there is no appreciable airspace consolidation or pulmonary edema. Marked cardiomegaly, unchanged. Electronically Signed   By: Kellie Simmering DO   On: 06/30/2021 15:18   DG Abd Portable 1 View  Result Date: 06/30/21 CLINICAL DATA:  Abdominal distension. EXAM: PORTABLE ABDOMEN - 1 VIEW COMPARISON:  February 07, 2020 FINDINGS: Limited view of the upper abdomen demonstrates placement of enteric catheter with tip overlying the gastric cardia. Nonobstructive bowel gas pattern. IMPRESSION: Limited view of the upper abdomen demonstrates placement of enteric catheter with tip overlying the gastric cardia. Electronically Signed   By: Fidela Salisbury M.D.   On: 06-30-21 15:14     Resolved Hospital Problem list   x  Assessment & Plan:  ASSESSMENT / PLAN:  PULMONARY A:  Severe acute respiratory failure due to acidosis and circulatory shock and renal failure - present on admission.  Required intubation in the emergency department 30-Jun-2021  06/30/2021 -> high oxygen needs an on the ventilator without sedation infusion  P:   PRVC VAP bundle   NEUROLOGIC A:   History of depression: Present at baseline Acute metabolic encephalopathy with comatose state at admission 06-30-21 -due to sepsis and metabolic issues   P:   Supportive care Will do as needed sedation    VASCULAR A:   History of hypertension at baseline History of hemorrhagic due to retroperitoneal hemorrhage in November 2021 Refractory circulatory shock on account of sepsis and also acidosis .   - No overt bleeding other than  oozing of blood from various sites suggestive of DIC  P:  Levophed infusion Vasopressin infusion Neo-Synephrine infusion MAP goal greater than  62  CARDIAC STRUCTURAL A: Baseline February 2021: Echo with ischemic cardiomyopathy [focal wall motion abnormalities] with chronic systolic heart failure ejection fraction 50%  -06-20-2021: High-sensitivity troponin almost 7000 concern for non-STEMI especially type II  P: Cycle cardiac enzymes Avoid heparin given history of bleeding retroperitoneal in November 2021 Get echocardiogram  CARDIAC ELECTRICAL A: Ventricular ectopies at admission -probably due to metabolic issues especially hyperkalemia   P: Optimize electrolyte profile  INFECTIOUS A:   Pneumonia is most likely etiology right lower lobe possibly aspiration given bedbound status - present on admission  P:   Broad-spectrum antibiotics Check multiplex respiratory virus panel Check urine strep Check urine Legionella  RENAL A:  Chronic kidney disease stage III-4.  Most recent baseline creatinine 4s in November 2021 but per history this had improved  At admission acute renal failure with metabolic acidosis and hyperkalemia   P:  Not a good candidate for CRRT Bicarb bolus and bicarb infusion started in the ED Hydration  ELECTROLYTES A:  Hyperkalemia P: Lokelma and bicarbonate infusion Not a candidate for CRRT   GASTROINTESTINAL A:   Baseline:  -2019 history of chronic gastritis  - 14 cm left adrenal mass associated with 9 cm cystic pelvic mass [as of November 2021].  No biopsy of these available - concern for [place] emesis (also bleeding at other siteS) - large new ascites at admission - concern for malignancy; p  P:   N.p.o. PPI IR guided paracentesis with labs (post para will do albumin)  OG tube to low intermittent suction  HEMATOLOGIC   - HEME A:  Anemia of chronic disease Baseline hemoglobin 9 g%. Current hemoglobin also 9 g%   P:  - PRBC for  hgb </= 6.9gm%    - exceptions are   -  if ACS susepcted/confirmed then transfuse for hgb </= 8.0gm%,  or    -  active bleeding with hemodynamic instability, then transfuse regardless of hemoglobin value   At at all times try to transfuse 1 unit prbc as possible with exception of active hemorrhage   HEMATOLOGIC - Platelets A Normal platelet admission but clinically appears to have DIC at admission  P DIC panel  ENDOCRINE A:   Diabetes at baseline  P:   SSI  MSK/DERM SNF resident bedbound at baseline since November 2021  Plan  -Portends poor prognosis  CODE  DNR at admission   Best Practice (right click and "Reselect all SmartList Selections" daily)   Diet/type: NPO Pain/Anxiety/Delirium protocol Yes and RASS goal: 0 to -1 VAP protocol (if indicated): Yes DVT prophylaxis: not indicated GI prophylaxis: PPI Glucose control:  SSI Central venous access:  N/A Arterial line:  N/A Foley:  Yes, and it is still needed Mobility:  bed rest  PT consulted: N/A Studies pending: None Culture data pending:BAL, urine , and blood Last reviewed culture data:today Antibiotics:cefepime and vanc Antibiotic de-escalation: no,  continue current rx Stop date: to be determined  Daily labs: ordered Code Status:  DNR Last date of multidisciplinary goals of care discussion [6/18 with Brother over phone from [place] ccm prognosis: Life-threating Disposition: admit to the intensive care    [place]   The patient Renee Griffin is critically ill with multiple organ systems failure and requires high complexity decision making for assessment and support, frequent evaluation and titration of therapies, application of advanced monitoring technologies and extensive interpretation of multiple databases.   Critical Care Time devoted to patient care services described in this note is  1  Minutes. This time reflects time of care of this signee Dr Brand Males. This critical care  time does not reflect procedure time, or teaching time or supervisory time of PA/NP/Med student/Med Resident etc but could involve care discussion time     Dr. Brand Males, M.D., [facility].C.P Pulmonary and Critical Care Medicine Staff Physician [facility] Pager: [phone], If no answer or between  15:00h - 7:00h: call [phone]  2021/07/12 4:57 PM

## 2021-06-25 NOTE — ED Notes (Signed)
Provider at bedside to place central line.

## 2021-06-25 NOTE — ED Notes (Signed)
Pt receiving 3rd liter normal saline bolus at this time per ED provider's verbal order.

## 2021-06-25 NOTE — ED Provider Notes (Signed)
[facility] DEPT Provider Note   CSN: [id number] Arrival date & time: 2021-06-14  1254     History Chief Complaint  Patient presents with   Altered Mental Status    Renee Griffin is a 65 y.o. female.  HPI     Past Medical History:  Diagnosis Date   Depression    Diabetes mellitus without complication ([place])    Heart disease, congenital    High cholesterol    Hypertension    Panic attack    Stroke [facility])     Patient Active Problem List   Diagnosis Date Noted   Septic shock ([place]) 06/14/21   Hemorrhagic shock ([place]) 11/01/2020   Palliative care by specialist    Goals of care, counseling/discussion    DNR (do not resuscitate)    AKI (acute kidney injury) ([place])    Adrenal mass greater than 4 cm in diameter ([place]) 10/27/2020   Acute blood loss anemia 10/27/2020   CKD (chronic kidney disease) stage 4, GFR 15-29 ml/min ([place]) 10/27/2020   Retroperitoneal bleed 10/26/2020   Acute exacerbation of chronic obstructive pulmonary disease (COPD) ([place]) 06/23/2020   Atrial fibrillation with RVR ([place]) 05/23/2020   Acute congestive heart failure ([place]) 05/17/2020   NSTEMI (non-ST elevated myocardial infarction) ([place]) 62/95/2841   Acute systolic heart failure ([place]) 01/19/2020   Acute lower GI bleeding 12/16/2018   Dehydration 04/13/2018   Ovarian mass, left    Abdominal pain 02/01/2018   Essential hypertension 03/04/2016   HLD (hyperlipidemia) 03/04/2016   Type 2 diabetes mellitus with circulatory disorder ([place]) 03/04/2016   Obesity 03/04/2016   CVA (cerebral vascular accident) ([employer]) 03/05/2015   Bilateral low back pain without sciatica 03/05/2015   Essential hypertension, benign 03/05/2015   Tobacco use disorder 03/05/2015   Hyperlipidemia 03/05/2015   Hyperparathyroidism, primary ([place]) 02/13/2013   Generalized ischemic cerebrovascular disease 02/06/2013   Hyperreflexia 02/06/2013   Abnormality of gait 02/06/2013   Disturbance of skin sensation  02/06/2013   CVA (cerebral infarction) 06/03/2012   HTN (hypertension) 06/03/2012   Dyslipidemia 06/03/2012   Anxiety 06/03/2012    Past Surgical History:  Procedure Laterality Date   CARDIAC SURGERY  2011   CABG   COLONOSCOPY WITH PROPOFOL N/A 02/08/2018   Procedure: COLONOSCOPY WITH PROPOFOL;  Surgeon: Otis Brace, MD;  Location: [facility];  Service: Gastroenterology;  Laterality: N/A;   COLONOSCOPY WITH PROPOFOL N/A 12/16/2018   Procedure: COLONOSCOPY WITH PROPOFOL;  Surgeon: Ronald Lobo, MD;  Location: [facility];  Service: Endoscopy;  Laterality: N/A;   DILATION AND EVACUATION     Patient states she had an abortion 30 or 40 years ago   ESOPHAGOGASTRODUODENOSCOPY (EGD) WITH PROPOFOL Left 02/05/2018   Procedure: ESOPHAGOGASTRODUODENOSCOPY (EGD) WITH PROPOFOL;  Surgeon: Otis Brace, MD;  Location: [facility];  Service: Gastroenterology;  Laterality: Left;   IR ANGIOGRAM SELECTIVE EACH ADDITIONAL VESSEL  12/18/2018   IR ANGIOGRAM SELECTIVE EACH ADDITIONAL VESSEL  12/18/2018   IR ANGIOGRAM SELECTIVE EACH ADDITIONAL VESSEL  12/18/2018   IR ANGIOGRAM SELECTIVE EACH ADDITIONAL VESSEL  12/18/2018   IR ANGIOGRAM VISCERAL SELECTIVE  12/18/2018   IR EMBO ART  VEN HEMORR LYMPH EXTRAV  INC GUIDE ROADMAPPING  12/18/2018   IR US GUIDE VASC ACCESS RIGHT  12/18/2018   LEFT HEART CATH AND CORONARY ANGIOGRAPHY N/A 02/10/2020   Procedure: LEFT HEART CATH AND CORONARY ANGIOGRAPHY;  Surgeon: Dixie Dials, MD;  Location: [facility];  Service: Cardiovascular;  Laterality: N/A;   WISDOM TOOTH EXTRACTION  OB History     Gravida  1   Para      Term      Preterm      AB  1   Living  0      SAB      IAB  1   Ectopic      Multiple      Live Births              Family History  Problem Relation Age of Onset   Cancer Mother        Theadora Rama OR cervical   Cancer Father        lung   Leukemia Brother    Cancer Sister        breast    Social  History   Tobacco Use   Smoking status: Every Day    Packs/day: 1.00    Years: 50.00    Pack years: 50.00    Types: Cigarettes   Smokeless tobacco: Never  Vaping Use   Vaping Use: Never used  Substance Use Topics   Alcohol use: No    Alcohol/week: 0.0 standard drinks   Drug use: No    Home Medications Prior to Admission medications   Medication Sig Start Date End Date Taking? Authorizing Provider  cholecalciferol (VITAMIN D3) 25 MCG (1000 UNIT) tablet Take 1 tablet (1,000 Units total) by mouth daily. 07/13/20  Yes Dorie Rank, MD  ferrous sulfate 325 (65 FE) MG tablet Take 1 tablet (325 mg total) by mouth daily with breakfast. 07/13/20  Yes Dorie Rank, MD  fluticasone [facility]) 50 MCG/ACT nasal spray Place 2 sprays into both nostrils daily at 12 noon. 04/04/21  Yes [provider]  Multiple Vitamin (MULTIVITAMIN WITH MINERALS) TABS tablet Take 1 tablet by mouth daily.   Yes [provider]  acetaminophen (TYLENOL) 325 MG tablet Take 2 tablets (650 mg total) by mouth every 4 (four) hours as needed for headache or mild pain. 01/22/20   Dixie Dials, MD  albuterol (VENTOLIN HFA) 108 (90 Base) MCG/ACT inhaler Inhale 2 puffs into the lungs every 6 (six) hours as needed for wheezing or shortness of breath. 01/22/20   Dixie Dials, MD  ALPRAZolam Duanne Moron) 0.25 MG tablet Take 1 tablet (0.25 mg total) by mouth every 8 (eight) hours. 11/06/20   Mitzi Hansen, MD  gemfibrozil (LOPID) 600 MG tablet Take 600 mg by mouth 2 (two) times daily. 02/19/18   [provider]  metoprolol tartrate (LOPRESSOR) 25 MG tablet Take 1 tablet (25 mg total) by mouth 2 (two) times daily. 11/06/20   Mitzi Hansen, MD  prazosin (MINIPRESS) 2 MG capsule Take 2 capsules (4 mg total) by mouth 3 (three) times daily. 11/06/20   Mitzi Hansen, MD  sertraline (ZOLOFT) 100 MG tablet Take 100 mg by mouth at bedtime.     [provider]    Allergies    Amoxicillin-pot clavulanate,  Bupropion, and Nicotine  Review of Systems   Review of Systems  Physical Exam Updated Vital Signs BP (!) 76/64   Pulse (!) 105   Temp (!) 95.4 F (35.2 C) (Rectal)   Resp 13   Ht 5\' 3"  (1.6 m)   Wt 77 kg   SpO2 (!) 77%   BMI 30.07 kg/m   Physical Exam Constitutional:      Appearance: She is ill-appearing and toxic-appearing.     Comments: Severely obtunded agonal breathing  HENT:     Head: Normocephalic.  Mouth/Throat:     Comments: Brown appearing fecal like matter covering her lips Eyes:     Comments: Pupils 4-3 and sluggish bilaterally.  Cardiovascular:     Rate and Rhythm: Rhythm irregular.  Pulmonary:     Comments: On nonrebreather, agonal breathing coarse breath sounds bilaterally Abdominal:     General: There is distension.     Comments: Firm, distended  Neurological:     Comments: Severely obtunded, intermittent nonverbal phonation noted.  Nonfocal movement noted.    ED Results / Procedures / Treatments   Labs (all labs ordered are listed, but only abnormal results are displayed) Labs Reviewed  COMPREHENSIVE METABOLIC PANEL - Abnormal; Notable for the following components:      Result Value   Potassium 5.4 (*)    CO2 8 (*)    Glucose, Bld 147 (*)    BUN 88 (*)    Creatinine, Ser 5.85 (*)    Total Protein 5.3 (*)    Albumin 2.4 (*)    GFR, Estimated 8 (*)    Anion gap 19 (*)    All other components within normal limits  LACTIC ACID, PLASMA - Abnormal; Notable for the following components:   Lactic Acid, Venous 3.8 (*)    All other components within normal limits  CBC WITH DIFFERENTIAL/PLATELET - Abnormal; Notable for the following components:   WBC 13.2 (*)    RBC 3.37 (*)    Hemoglobin 9.5 (*)    HCT 33.6 (*)    MCHC 28.3 (*)    RDW 17.3 (*)    Neutro Abs 12.4 (*)    Lymphs Abs 0.3 (*)    Abs Immature Granulocytes 0.10 (*)    All other components within normal limits  PROTIME-INR - Abnormal; Notable for the following components:    Prothrombin Time 27.0 (*)    INR 2.5 (*)    All other components within normal limits  TROPONIN I (HIGH SENSITIVITY) - Abnormal; Notable for the following components:   Troponin I (High Sensitivity) 0,037 (*)    All other components within normal limits  CULTURE, BLOOD (ROUTINE X 2)  CULTURE, BLOOD (ROUTINE X 2)  URINE CULTURE  RESP PANEL BY RT-PCR (FLU A&B, COVID) ARPGX2  LACTIC ACID, PLASMA  URINALYSIS, ROUTINE W REFLEX MICROSCOPIC  BLOOD GAS, ARTERIAL    EKG None  Radiology No results found.  Procedures .Central Line  Date/Time: 2021/06/18 2:47 PM Performed by: Luna Fuse, MD Authorized by: Luna Fuse, MD   Comments:     Emergently place.  Left-sided IJ with ultrasound guidance, Seldinger technique, maximal sterile barrier technique applied.  Good return of nonpulsatile blood from all 3 ports.  Sutured into place with a Biopatch and dressing covering.   Date/Time: 06-18-21 2:47 PM Performed by: Luna Fuse, MD Comments: Unresponsive, agonal breathing suspect aspiration in setting of bowel obstruction.  7.5 ET tube used for intubation.  RSI medication given rocuronium 100 mg and etomidate 20 mg IV.  Initially had tried to place an NG tube due to concern for vomiting if I laid her flat, however 2 attempts of NG tube placement were unsuccessful and decided to proceed with intubation prior to OG placement.  With glide scope and ET tube secured at 24 at the teeth with breath sounds equal bilaterally.  Good color change on end-tidal CO2 monitor.      .Critical Care  Date/Time: June 18, 2021 2:49 PM Performed by: Luna Fuse, MD Authorized by: Luna Fuse, MD  Critical care provider statement:    Critical care time (minutes):  40   Critical care time was exclusive of:  Separately billable procedures and treating other patients and teaching time   Critical care was necessary to treat or prevent imminent or life-threatening deterioration of the following  conditions:  Cardiac failure, CNS failure or compromise, circulatory failure and shock   Medications Ordered in ED Medications  0.9 %  sodium chloride infusion (has no administration in time range)  ceFEPIme (MAXIPIME) 2 g in sodium chloride 0.9 % 100 mL IVPB (2 g Intravenous New Bag/Given 06/29/21 1444)  succinylcholine (ANECTINE) 200 MG/10ML syringe (has no administration in time range)  rocuronium bromide 100 MG/10ML SOSY (has no administration in time range)  vecuronium (NORCURON) 10 MG injection (has no administration in time range)  sterile water (preservative free) injection (has no administration in time range)  lidocaine (cardiac) 100 mg/68mL (XYLOCAINE) 100 MG/5ML injection 2% (has no administration in time range)  etomidate (AMIDATE) 2 MG/ML injection (has no administration in time range)  vasopressin (PITRESSIN) 20 Units in sodium chloride 0.9 % 100 mL infusion-*FOR SHOCK* (has no administration in time range)  fentaNYL 2528mcg in NS 218mL (28mcg/ml) infusion-PREMIX (has no administration in time range)  norepinephrine (LEVOPHED) 4-5 MG/250ML-% infusion SOLN (93.8 mg  New Bag/Given June 29, 2021 1443)  sodium chloride 0.9 % bolus 2,310 mL (2,310 mLs Intravenous New Bag/Given 29-Jun-2021 1330)    ED Course  I have reviewed the triage vital signs and the nursing notes.  Pertinent labs & imaging results that were available during my care of the patient were reviewed by me and considered in my medical decision making (see chart for details).    MDM Rules/Calculators/A&P                          MS on a nonrebreather satting about 93% which is low.  Patient has a POLST here that states full code full treatment.  We proceeded with aggressive treatment given that she had agonal breathing and blood pressure in the 91Q to 90 systolic range.  Patient was intubated, central line placed.  Meeting and other ancillary studies pending, however clinically I suspect bowel obstruction with aspiration  and respiratory failure in the setting of shock now.  Case discussed with ICU physician who admitted the patient.  I spoke with the patient's brother who was on his way about an hour out.  He seems unsure if he wants to pursue full care or not but would like to pursue the POLST as it is for now.  Final Clinical Impression(s) / ED Diagnoses Final diagnoses:  Other specified intestinal obstruction, unspecified whether partial or complete ([place])  Acute respiratory failure with hypoxia ([place])  Shock ([place])    Rx / DC Orders ED Discharge Orders     None        Luna Fuse, MD 2021-06-29 1453

## 2021-06-25 NOTE — ED Notes (Signed)
Pt rectal temp 95.4, bair hugger put on pt at 38 degrees

## 2021-06-25 NOTE — ED Notes (Signed)
Levophed increased to 64mcg/min per ER provider's verbal order. Pt's BP hypotensive at 54/33 at this time.

## 2021-06-25 NOTE — Progress Notes (Signed)
Pt blood pressure continues to decrease on max pressors. Oxygenation decreasing on max FiO2. E-link previously notified. Ground team at bedside. Minor interventions not performed, focused on maintaining pressure and oxygenation.

## 2021-06-25 NOTE — Progress Notes (Signed)
[facility] Progress Note Patient Name: Renee Griffin DOB: 05-Sep-1956 MRN: [id number]   Date of Service  07-11-2021  HPI/Events of Note  Hypotensive on norepinephrine, phenylephrine and vasopressin 0.03 Already on bicarbonate drip. 5:35 pm labs reviewed Vent settings recently revised by Dr Chase Caller  eICU Interventions  Increase vasopressin 0.04 Give another bicarb 100 meqs push Family updated by bedside team of grim prognosis     Intervention Category Major Interventions: Hypotension - evaluation and management;Acid-Base disturbance - evaluation and management  Judd Lien 07/11/2021, 8:50 PM

## 2021-06-25 NOTE — ED Notes (Signed)
100mg  Rocuronium given via peripheral IV at this time.

## 2021-06-25 NOTE — ED Notes (Signed)
Per ED provider's verbal order, Levophed increased to 29mcg/min at this time.

## 2021-06-25 NOTE — Progress Notes (Addendum)
Pt passed with sister in law and Kovacich MD at bedside. Brother and niece contacted during passing, given opportunity to speak to patient over speaker as pt was passing. After passing, Renee Griffin (pt brother and healthcare POA) contacted. E-link physician [place] spoke to POA over phone to explain cause of death and sequence of events. POA denies autopsy, Laury Axon RN witnessed.

## 2021-06-25 NOTE — Consult Note (Signed)
Urology Consult  Referring physician: Dr. Chase Caller Reason for referral: Difficult foley placement  Chief Complaint: difficult foley placement  History of Present Illness: Renee Griffin is a 65yo admitted with mental status change and shock. She was admitted to the ICU and multiple attempts were made by the nursing staff to place a foley which was unsuccessful. Patent is currently intubated  Past Medical History:  Diagnosis Date   Depression    Diabetes mellitus without complication ([place])    Heart disease, congenital    High cholesterol    Hypertension    Panic attack    Stroke [facility])    Past Surgical History:  Procedure Laterality Date   CARDIAC SURGERY  2011   CABG   COLONOSCOPY WITH PROPOFOL N/A 02/08/2018   Procedure: COLONOSCOPY WITH PROPOFOL;  Surgeon: [place] Brace, MD;  Location: [facility];  Service: Gastroenterology;  Laterality: N/A;   COLONOSCOPY WITH PROPOFOL N/A 12/16/2018   Procedure: COLONOSCOPY WITH PROPOFOL;  Surgeon: [place] Lobo, MD;  Location: [facility];  Service: Endoscopy;  Laterality: N/A;   DILATION AND EVACUATION     Patient states she had an abortion 30 or 40 years ago   ESOPHAGOGASTRODUODENOSCOPY (EGD) WITH PROPOFOL Left 02/05/2018   Procedure: ESOPHAGOGASTRODUODENOSCOPY (EGD) WITH PROPOFOL;  Surgeon: [place]is Brace, MD;  [facility];  Service: Gastroenterology;  Laterality: Left;   IR ANGIOGRAM SELECTIVE EACH ADDITIONAL VESSEL  12/18/2018   IR ANGIOGRAM SELECTIVE EACH ADDITIONAL VESSEL  12/18/2018   IR ANGIOGRAM SELECTIVE EACH ADDITIONAL VESSEL  12/18/2018   IR ANGIOGRAM SELECTIVE EACH ADDITIONAL VESSEL  12/18/2018   IR ANGIOGRAM VISCERAL SELECTIVE  12/18/2018   IR EMBO ART  VEN HEMORR LYMPH EXTRAV  INC GUIDE ROADMAPPING  12/18/2018   IR US GUIDE VASC ACCESS RIGHT  12/18/2018   LEFT HEART CATH AND CORONARY ANGIOGRAPHY N/A 02/10/2020   Procedure: LEFT HEART CATH AND CORONARY ANGIOGRAPHY;  Surgeon: Dixie Dials, MD;  Location: [facility];  Service: Cardiovascular;  Laterality: N/A;   WISDOM TOOTH EXTRACTION      Medications: I have reviewed the patient's current medications. Allergies:  Allergies  Allergen Reactions   Amoxicillin-Pot Clavulanate Diarrhea    Has patient had a PCN reaction causing immediate rash, facial/tongue/throat swelling, SOB or lightheadedness with hypotension: Unknown Has patient had a PCN reaction causing severe rash involving mucus membranes or skin necrosis:UnknoWN Has patient had a PCN reaction that required hospitalization: Unknown Has patient had a PCN reaction occurring within the last 10 years: Unknown If all of the above answers are "NO", then may proceed with Cephalosporin use.    Bupropion Nausea Only   Nicotine Nausea Only and Rash    Reaction to patches    Family History  Problem Relation Age of Onset   Cancer Mother        Theadora Rama OR cervical   Cancer Father        lung   Leukemia Brother    Cancer Sister        breast   Social History:  reports that she has been smoking cigarettes. She has a 50.00 pack-year smoking history. She has never used smokeless tobacco. She reports that she does not drink alcohol and does not use drugs.  Review of Systems  Unable to perform ROS: Intubated   Physical Exam:  Vital signs in last 24 hours: Temp:  [95.4 F (35.2 C)] 95.4 F (35.2 C) (06/18 1310) Pulse Rate:  [85-143] 119 (06/18 1728) Resp:  [0-39] 24 (06/18 1830) BP: (  44-109)/(28-82) 80/44 (06/18 1830) SpO2:  [55 %-88 %] 55 % (06/18 1715) FiO2 (%):  [100 %] 100 % (06/18 1728) Weight:  [77 kg] 77 kg (06/18 1314) Physical Exam Constitutional:      Appearance: Normal appearance.  HENT:     Head: Normocephalic and atraumatic.     Nose: Nose normal. No congestion.  Eyes:     Conjunctiva/sclera: Conjunctivae normal.  Cardiovascular:     Rate and Rhythm: Tachycardia present.  Abdominal:     General: There is distension.  Musculoskeletal:        General: Normal  range of motion.     Cervical back: Neck supple.  Skin:    General: Skin is warm and dry.    Laboratory Data:  Results for orders placed or performed during the hospital encounter of July 12, 2021 (from the past 72 hour(s))  Glucose, capillary     Status: Abnormal   Collection Time: 2021/07/12  1:17 PM  Result Value Ref Range   Glucose-Capillary 133 (H) 70 - 99 mg/dL    Comment: Glucose reference range applies only to samples taken after fasting for at least 8 hours.  Comprehensive metabolic panel     Status: Abnormal   Collection Time: 07/12/21  1:21 PM  Result Value Ref Range   Sodium 135 135 - 145 mmol/L   Potassium 5.4 (H) 3.5 - 5.1 mmol/L   Chloride 108 98 - 111 mmol/L   CO2 8 (L) 22 - 32 mmol/L   Glucose, Bld 147 (H) 70 - 99 mg/dL    Comment: Glucose reference range applies only to samples taken after fasting for at least 8 hours.   BUN 88 (H) 8 - 23 mg/dL   Creatinine, Ser 5.85 (H) 0.44 - 1.00 mg/dL   Calcium 9.1 8.9 - 10.3 mg/dL   Total Protein 5.3 (L) 6.5 - 8.1 g/dL   Albumin 2.4 (L) 3.5 - 5.0 g/dL   AST 41 15 - 41 U/L   ALT 13 0 - 44 U/L   Alkaline Phosphatase 76 38 - 126 U/L   Total Bilirubin 1.0 0.3 - 1.2 mg/dL   GFR, Estimated 8 (L) >60 mL/min    Comment: (NOTE) Calculated using the CKD-EPI Creatinine Equation (2021)    Anion gap 19 (H) 5 - 15    Comment: Performed at [facility], [place] [address] [place], [place] [address]  Lactic acid, plasma     Status: Abnormal   Collection Time: 12-Jul-2021  1:21 PM  Result Value Ref Range   Lactic Acid, Venous 3.8 (HH) 0.5 - 1.9 mmol/L    Comment: CRITICAL RESULT CALLED TO, READ BACK BY AND VERIFIED WITH: SAVOIE,B. RN AT 1444 07-12-2021 MULLINS,T Performed at [facility], [place] [address], [place], [place] [address]   CBC with Differential     Status: Abnormal   Collection Time: 07/12/2021  1:21 PM  Result Value Ref Range   WBC 13.2 (H) 4.0 - 10.5 K/uL   RBC 3.37 (L) 3.87 - 5.11 MIL/uL    Hemoglobin 9.5 (L) 12.0 - 15.0 g/dL   HCT 33.6 (L) 36.0 - 46.0 %   MCV 99.7 80.0 - 100.0 fL   MCH 28.2 26.0 - 34.0 pg   MCHC 28.3 (L) 30.0 - 36.0 g/dL   RDW 17.3 (H) 11.5 - 15.5 %   Platelets 298 150 - 400 K/uL   nRBC 0.0 0.0 - 0.2 %   Neutrophils Relative % 93 %   Neutro Abs 12.4 (  H) 1.7 - 7.7 K/uL   Lymphocytes Relative 3 %   Lymphs Abs 0.3 (L) 0.7 - 4.0 K/uL   Monocytes Relative 3 %   Monocytes Absolute 0.4 0.1 - 1.0 K/uL   Eosinophils Relative 0 %   Eosinophils Absolute 0.0 0.0 - 0.5 K/uL   Basophils Relative 0 %   Basophils Absolute 0.0 0.0 - 0.1 K/uL   Immature Granulocytes 1 %   Abs Immature Granulocytes 0.10 (H) 0.00 - 0.07 K/uL    Comment: Performed at [facility], [place] [address], [place], [place] [address]  Protime-INR     Status: Abnormal   Collection Time: June 21, 2021  1:21 PM  Result Value Ref Range   Prothrombin Time 27.0 (H) 11.4 - 15.2 seconds   INR 2.5 (H) 0.8 - 1.2    Comment: (NOTE) INR goal varies based on device and disease states. Performed at [facility], [place] [address] [place], [place] [address]   Troponin I (High Sensitivity)     Status: Abnormal   Collection Time: 06/21/21  1:34 PM  Result Value Ref Range   Troponin I (High Sensitivity) 6,692 (HH) <18 ng/L    Comment: CRITICAL RESULT CALLED TO, READ BACK BY AND VERIFIED WITH: SAVOIE,B. RN AT 5170 Jun 21, 2021 MULLINS,T (NOTE) Elevated high sensitivity troponin I (hsTnI) values and significant  changes across serial measurements may suggest ACS but many other  chronic and acute conditions are known to elevate hsTnI results.  Refer to the Links section for chest pain algorithms and additional  guidance. Performed at [facility], [place] [address], [place], [place] [address]   Blood gas, arterial     Status: Abnormal   Collection Time: 06-21-21  2:42 PM  Result Value Ref Range   FIO2 100.00    Delivery systems VENTILATOR    Mode PRESSURE  REGULATED VOLUME CONTROL    VT 420 mL   LHR 18.0 resp/min   Peep/cpap 10.0 cm H20   pH, Arterial 6.882 (LL) 7.350 - 7.450    Comment: CRITICAL RESULT CALLED TO, READ BACK BY AND VERIFIED WITH: BANNO,A. RN AT 4496 06-21-21 MULLINS,T    pCO2 arterial 34.4 32.0 - 48.0 mmHg   pO2, Arterial 217 (H) 83.0 - 108.0 mmHg   Bicarbonate 6.1 (L) 20.0 - 28.0 mmol/L   Acid-base deficit 25.7 (H) 0.0 - 2.0 mmol/L   O2 Saturation 97.7 %   Patient temperature 98.6    Collection site RIGHT RADIAL    Drawn by [id number]    Allens test (pass/fail) PASS PASS    Comment: Performed at [facility], [place] [address] [place], [place] [address]  Blood gas, arterial     Status: Abnormal   Collection Time: June 21, 2021  5:10 PM  Result Value Ref Range   FIO2 100.00    pH, Arterial 6.943 (LL) 7.350 - 7.450    Comment: CRITICAL RESULT CALLED TO, READ BACK BY AND VERIFIED WITH: DOMAN,J RN @1846  ON 06/21/2021 JACKSON,K    pCO2 arterial 38.0 32.0 - 48.0 mmHg   pO2, Arterial 124 (H) 83.0 - 108.0 mmHg   Bicarbonate 7.8 (L) 20.0 - 28.0 mmol/L   Acid-base deficit 23.3 (H) 0.0 - 2.0 mmol/L   O2 Saturation 96.0 %   Patient temperature 98.6    Allens test (pass/fail) PASS PASS    Comment: Performed at [facility], [place] [address] [place], [place] [address]  Comprehensive metabolic panel     Status: Abnormal  Collection Time: 2021-06-13  5:35 PM  Result Value Ref Range   Sodium 136 135 - 145 mmol/L   Potassium 4.7 3.5 - 5.1 mmol/L   Chloride 108 98 - 111 mmol/L   CO2 10 (L) 22 - 32 mmol/L   Glucose, Bld 242 (H) 70 - 99 mg/dL    Comment: Glucose reference range applies only to samples taken after fasting for at least 8 hours.   BUN 82 (H) 8 - 23 mg/dL   Creatinine, Ser 5.30 (H) 0.44 - 1.00 mg/dL   Calcium 7.9 (L) 8.9 - 10.3 mg/dL   Total Protein 4.4 (L) 6.5 - 8.1 g/dL   Albumin 2.1 (L) 3.5 - 5.0 g/dL   AST 76 (H) 15 - 41 U/L   ALT 20 0 - 44 U/L   Alkaline Phosphatase 67 38 - 126 U/L    Total Bilirubin 0.9 0.3 - 1.2 mg/dL   GFR, Estimated 8 (L) >60 mL/min    Comment: (NOTE) Calculated using the CKD-EPI Creatinine Equation (2021)    Anion gap 18 (H) 5 - 15    Comment: Performed at [facility], [place] [address], [place], [place] [address]  Magnesium     Status: None   Collection Time: 06/13/2021  5:35 PM  Result Value Ref Range   Magnesium 2.1 1.7 - 2.4 mg/dL    Comment: Performed at [facility], [place] [address], [place], [place] [address]  Phosphorus     Status: Abnormal   Collection Time: 06-13-2021  5:35 PM  Result Value Ref Range   Phosphorus 8.3 (H) 2.5 - 4.6 mg/dL    Comment: Performed at [facility], [place] [address], [place], [place] [address]  Amylase     Status: None   Collection Time: June 13, 2021  5:35 PM  Result Value Ref Range   Amylase 52 28 - 100 U/L    Comment: Performed at [facility], [place] [address] [place], [place] [address]  Lipase, blood     Status: None   Collection Time: 06-13-2021  5:35 PM  Result Value Ref Range   Lipase 16 11 - 51 U/L    Comment: Performed at [facility], [place] [address], [place], [place] [address]  Procalcitonin     Status: None   Collection Time: 06/13/21  5:35 PM  Result Value Ref Range   Procalcitonin <0.10 ng/mL    Comment:        Interpretation: PCT (Procalcitonin) <= 0.5 ng/mL: Systemic infection (sepsis) is not likely. Local bacterial infection is possible. (NOTE)       Sepsis PCT Algorithm           Lower Respiratory Tract                                      Infection PCT Algorithm    ----------------------------     ----------------------------         PCT < 0.25 ng/mL                PCT < 0.10 ng/mL          Strongly encourage             Strongly discourage   discontinuation of antibiotics    initiation of antibiotics    ----------------------------     -----------------------------       PCT 0.25 -  0.50 ng/mL             PCT 0.10 - 0.25 ng/mL               OR       >80% decrease in PCT            Discourage initiation of                                            antibiotics      Encourage discontinuation           of antibiotics    ----------------------------     -----------------------------         PCT >= 0.50 ng/mL              PCT 0.26 - 0.50 ng/mL               AND        <80% decrease in PCT             Encourage initiation of                                             antibiotics       Encourage continuation           of antibiotics    ----------------------------     -----------------------------        PCT >= 0.50 ng/mL                  PCT > 0.50 ng/mL               AND         increase in PCT                  Strongly encourage                                      initiation of antibiotics    Strongly encourage escalation           of antibiotics                                     -----------------------------                                           PCT <= 0.25 ng/mL                                                 OR                                        > 80% decrease in PCT  Discontinue / Do not initiate                                             antibiotics  Performed at [place] [address] [place], [place] [address]   CBC WITH DIFFERENTIAL     Status: Abnormal   Collection Time: 14-Jun-2021  5:35 PM  Result Value Ref Range   WBC 17.1 (H) 4.0 - 10.5 K/uL   RBC 3.26 (L) 3.87 - 5.11 MIL/uL   Hemoglobin 9.2 (L) 12.0 - 15.0 g/dL   HCT 31.7 (L) 36.0 - 46.0 %   MCV 97.2 80.0 - 100.0 fL   MCH 28.2 26.0 - 34.0 pg   MCHC 29.0 (L) 30.0 - 36.0 g/dL   RDW 17.4 (H) 11.5 - 15.5 %   Platelets 308 150 - 400 K/uL   nRBC 0.0 0.0 - 0.2 %   Neutrophils Relative % 94 %   Neutro Abs 16.1 (H) 1.7 - 7.7 K/uL   Lymphocytes Relative 2 %   Lymphs Abs 0.3 (L) 0.7 - 4.0 K/uL   Monocytes Relative 3 %   Monocytes Absolute 0.5  0.1 - 1.0 K/uL   Eosinophils Relative 0 %   Eosinophils Absolute 0.0 0.0 - 0.5 K/uL   Basophils Relative 0 %   Basophils Absolute 0.0 0.0 - 0.1 K/uL   Immature Granulocytes 1 %   Abs Immature Granulocytes 0.19 (H) 0.00 - 0.07 K/uL    Comment: Performed at [facility], [place] [address] [place], [place] [address]  Protime-INR     Status: Abnormal   Collection Time: 06/14/2021  5:35 PM  Result Value Ref Range   Prothrombin Time 28.6 (H) 11.4 - 15.2 seconds   INR 2.7 (H) 0.8 - 1.2    Comment: (NOTE) INR goal varies based on device and disease states. Performed at [facility], [place] [address] [place], [place] [address]   DIC Panel ONCE - STAT     Status: Abnormal   Collection Time: Jun 14, 2021  5:35 PM  Result Value Ref Range   Prothrombin Time 29.1 (H) 11.4 - 15.2 seconds   INR 2.8 (H) 0.8 - 1.2    Comment: (NOTE) INR goal varies based on device and disease states.    aPTT 36 24 - 36 seconds   Fibrinogen 171 (L) 210 - 475 mg/dL   D-Dimer, Quant 3.25 (H) 0.00 - 0.50 ug/mL-FEU    Comment: (NOTE) At the manufacturer cut-off value of 0.5 g/mL FEU, this assay has a negative predictive value of 95-100%.This assay is intended for use in conjunction with a clinical pretest probability (PTP) assessment model to exclude pulmonary embolism (PE) and deep venous thrombosis (DVT) in outpatients suspected of PE or DVT. Results should be correlated with clinical presentation.    Platelets 308 150 - 400 K/uL   Smear Review OVALOCYTES     Comment: BURR CELLS TEARDROP CELLS Performed at [place] [address], [place], [place] [address]   Glucose, capillary     Status: Abnormal   Collection Time: 06-14-21  5:40 PM  Result Value Ref Range   Glucose-Capillary 157 (H) 70 - 99 mg/dL    Comment: Glucose reference range applies only to samples taken after fasting for at least 8 hours.   Comment 1 Notify RN    Comment 2 Document  in Chart    MRSA PCR Screening     Status: Abnormal   Collection Time: 06/29/21  5:51 PM  Result Value Ref Range   MRSA by PCR POSITIVE (A) NEGATIVE    Comment:        The GeneXpert MRSA Assay (FDA approved for NASAL specimens only), is one component of a comprehensive MRSA colonization surveillance program. It is not intended to diagnose MRSA infection nor to guide or monitor treatment for MRSA infections. RESULT CALLED TO, READ BACK BY AND VERIFIED WITH: JOSE AGUIRRE AT 1940 ON Jun 29, 2021 BY Buel Ream Performed at [facility], [place] [address], [place], [place] [address]   Blood gas, arterial     Status: Abnormal   Collection Time: Jun 29, 2021  8:08 PM  Result Value Ref Range   FIO2 100.00    Delivery systems VENTILATOR    Mode PRESSURE REGULATED VOLUME CONTROL    VT 310 mL   LHR 30 resp/min   Peep/cpap 10.0 cm H20   pH, Arterial 6.884 (LL) 7.350 - 7.450    Comment: CRITICAL RESULT CALLED TO, READ BACK BY AND VERIFIED WITH: JOSE AGUIRRE AT 2032 ON 2021/06/29 BY MAJ    pCO2 arterial 50.3 (H) 32.0 - 48.0 mmHg   pO2, Arterial 33.2 (LL) 83.0 - 108.0 mmHg    Comment: CRITICAL RESULT CALLED TO, READ BACK BY AND VERIFIED WITH: JOSE AGUIRRE AT 2032 ON 06-29-21 BY MAJ    Bicarbonate 9.0 (L) 20.0 - 28.0 mmol/L   Acid-base deficit 23.4 (H) 0.0 - 2.0 mmol/L   O2 Saturation 40.9 %   Patient temperature 93.7    Collection site LEFT BRACHIAL     Comment: Performed at [place] [address], [place], [place] [address]   Recent Results (from the past 240 hour(s))  MRSA PCR Screening     Status: Abnormal   Collection Time: 2021/06/29  5:51 PM  Result Value Ref Range Status   MRSA by PCR POSITIVE (A) NEGATIVE Final    Comment:        The GeneXpert MRSA Assay (FDA approved for NASAL specimens only), is one component of a comprehensive MRSA colonization surveillance program. It is not intended to diagnose MRSA infection nor to guide or monitor treatment  for MRSA infections. RESULT CALLED TO, READ BACK BY AND VERIFIED WITH: JOSE AGUIRRE AT 1940 ON 2021/06/29 BY Buel Ream Performed at [facility], [place] [address] [place], [place] [address]    Creatinine: Recent Labs    29-Jun-2021 1321 2021/06/29 1735  CREATININE 5.85* 5.30*   Baseline Creatinine: 4  Impression/Assessment:  64yo with acute renal failure and difficult foley placement  Plan:  16 french foley placed without incident and flushed with 10cc of normal saliine. 20cc of dark urine drained.  Nicolette Bang 06/29/21, 8:59 PM

## 2021-06-25 NOTE — ED Notes (Signed)
Per ED provider's verbal order, Levophed increased to 6mcg/min at this time d/t pt BP of 44/28.

## 2021-06-25 NOTE — ED Triage Notes (Addendum)
Per EMS, patient from [place], [place] today according to staff. Responsive to painful stimuli. CBG initial 67. Recent CBG 196. 1mg  Ativan prior to EMS arrival. Staff reports patient has UTI. Unknown if she is being treated at this time.  20g R AC 349ml NS with EMS

## 2021-06-25 NOTE — ED Notes (Signed)
NG attempted x3, once by ED provider. Decision made by ED provider to intubate at this time.

## 2021-06-25 NOTE — ED Notes (Signed)
ED provider at the bedside for intubation. RT at the bedside.

## 2021-06-25 NOTE — ED Notes (Signed)
7.5 ET tube. 23 at the teeth. Intubation complete at this time.

## 2021-06-25 NOTE — ED Notes (Signed)
3662 trop Lactic 3.8  Reported to MD [place]

## 2021-06-25 NOTE — Progress Notes (Signed)
ETT pulled back 2.5cm to 22cm at lip

## 2021-06-25 NOTE — ED Notes (Signed)
20mg  etomidate given via peripheral IV

## 2021-06-25 NOTE — Progress Notes (Signed)
Pt family called and made aware of dropping pressure. Family aware, states unable to come to see patient. Phone put up to patient's ear on speaker and family spoke to patient.

## 2021-06-25 NOTE — ED Notes (Signed)
Pt vomited, suctioned pt- provider aware

## 2021-06-25 NOTE — Progress Notes (Signed)
   Recent Labs  Lab 2021/07/07 1442 07-07-21 1710  PHART 6.882* 6.943*  PCO2ART 34.4 38.0  PO2ART 217* 124*  HCO3 6.1* 7.8*  O2SAT 97.7 96.0     PH some better Already on max bicarb gtt  Plan  - inreas RR to 30/mn - check abg at 8pm  SIGNATURE    Dr. Brand Males, M.D., F.C.C.P,  Pulmonary and Critical Care Medicine Staff Physician, [place] Director - Interstitial Lung Disease  Program  Pulmonary [place] at [place], [place], [address]  Pager: [phone], If no answer  OR between  19:00-7:00h: page [phone]  [phone] Telephone (clinical office): [phone] Telephone (research): [phone]  7:00 PM 07/07/2021

## 2021-06-25 DEATH — deceased

## 2021-08-16 MED FILL — Norepinephrine-Dextrose IV Solution 4 MG/250ML-5%: INTRAVENOUS | Qty: 250 | Status: AC

## 2021-11-13 IMAGING — CR DG ABDOMEN 2V
3 series · 3 of 3 positions shown · non-contrast
Comparison: 04/13/2018

CLINICAL DATA: Abdominal pain

EXAM:
ABDOMEN - 2 VIEW

[abdomen supine (1 of 2)]
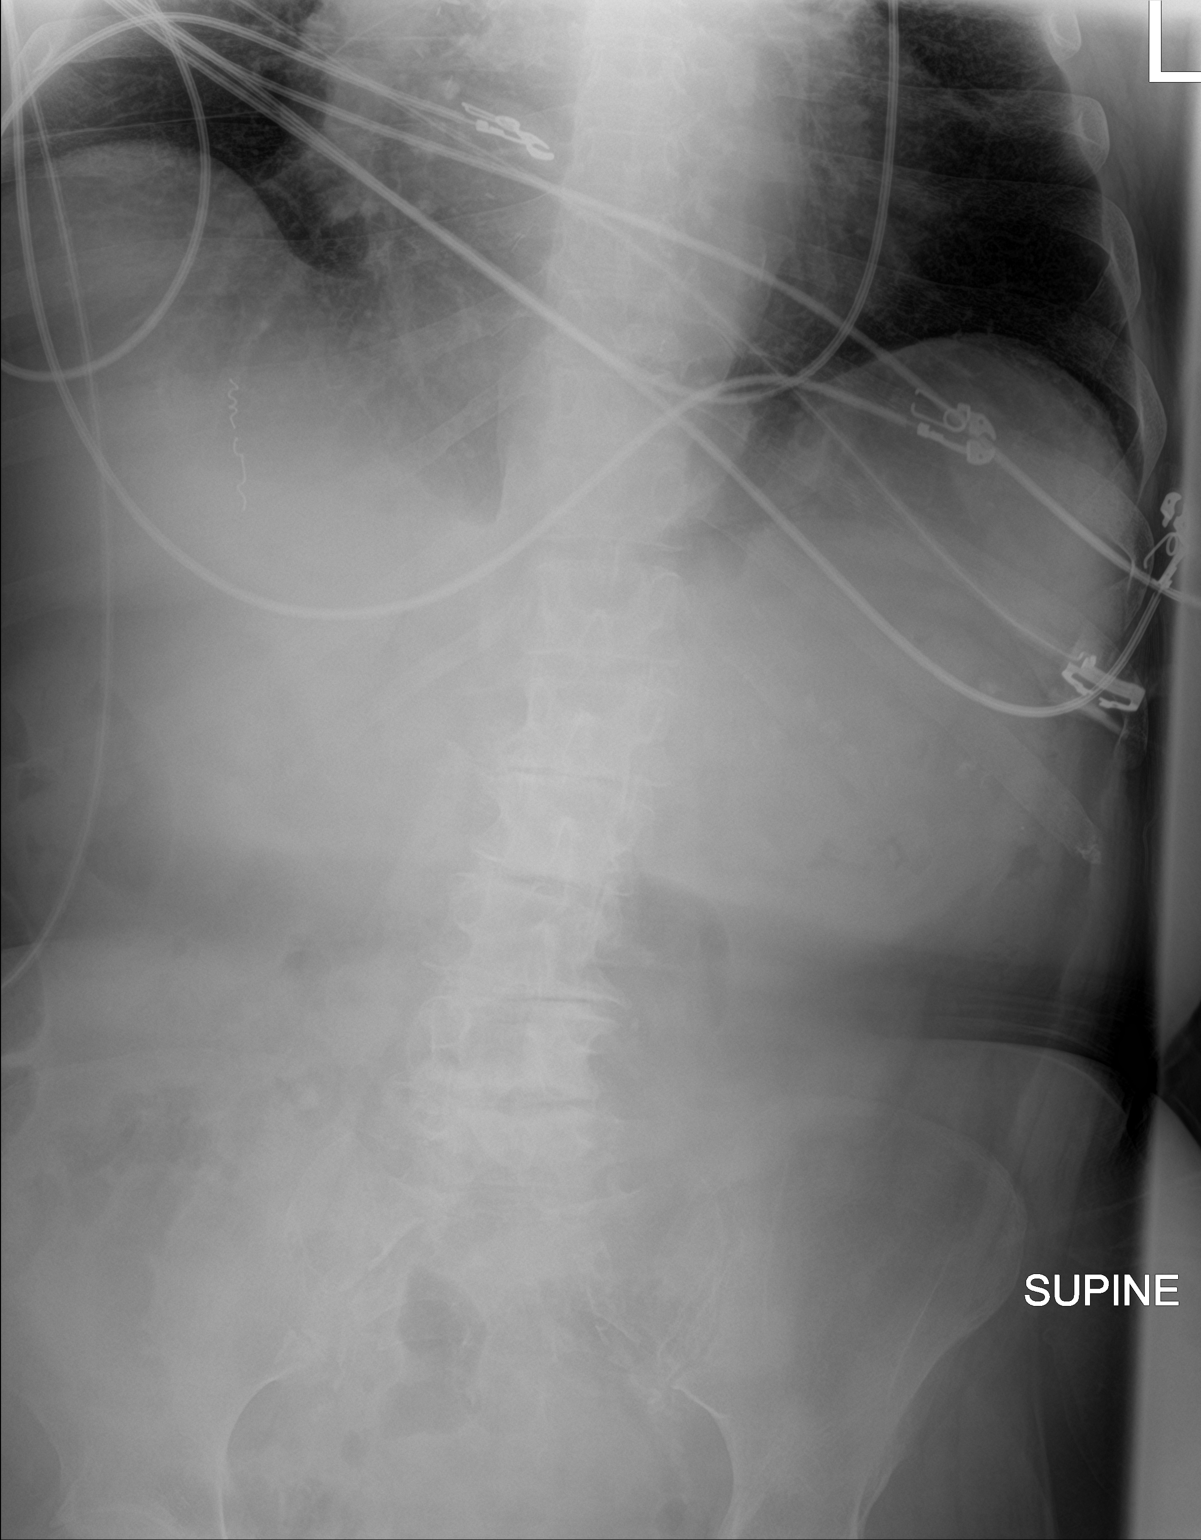

[abdomen supine (2 of 2)]
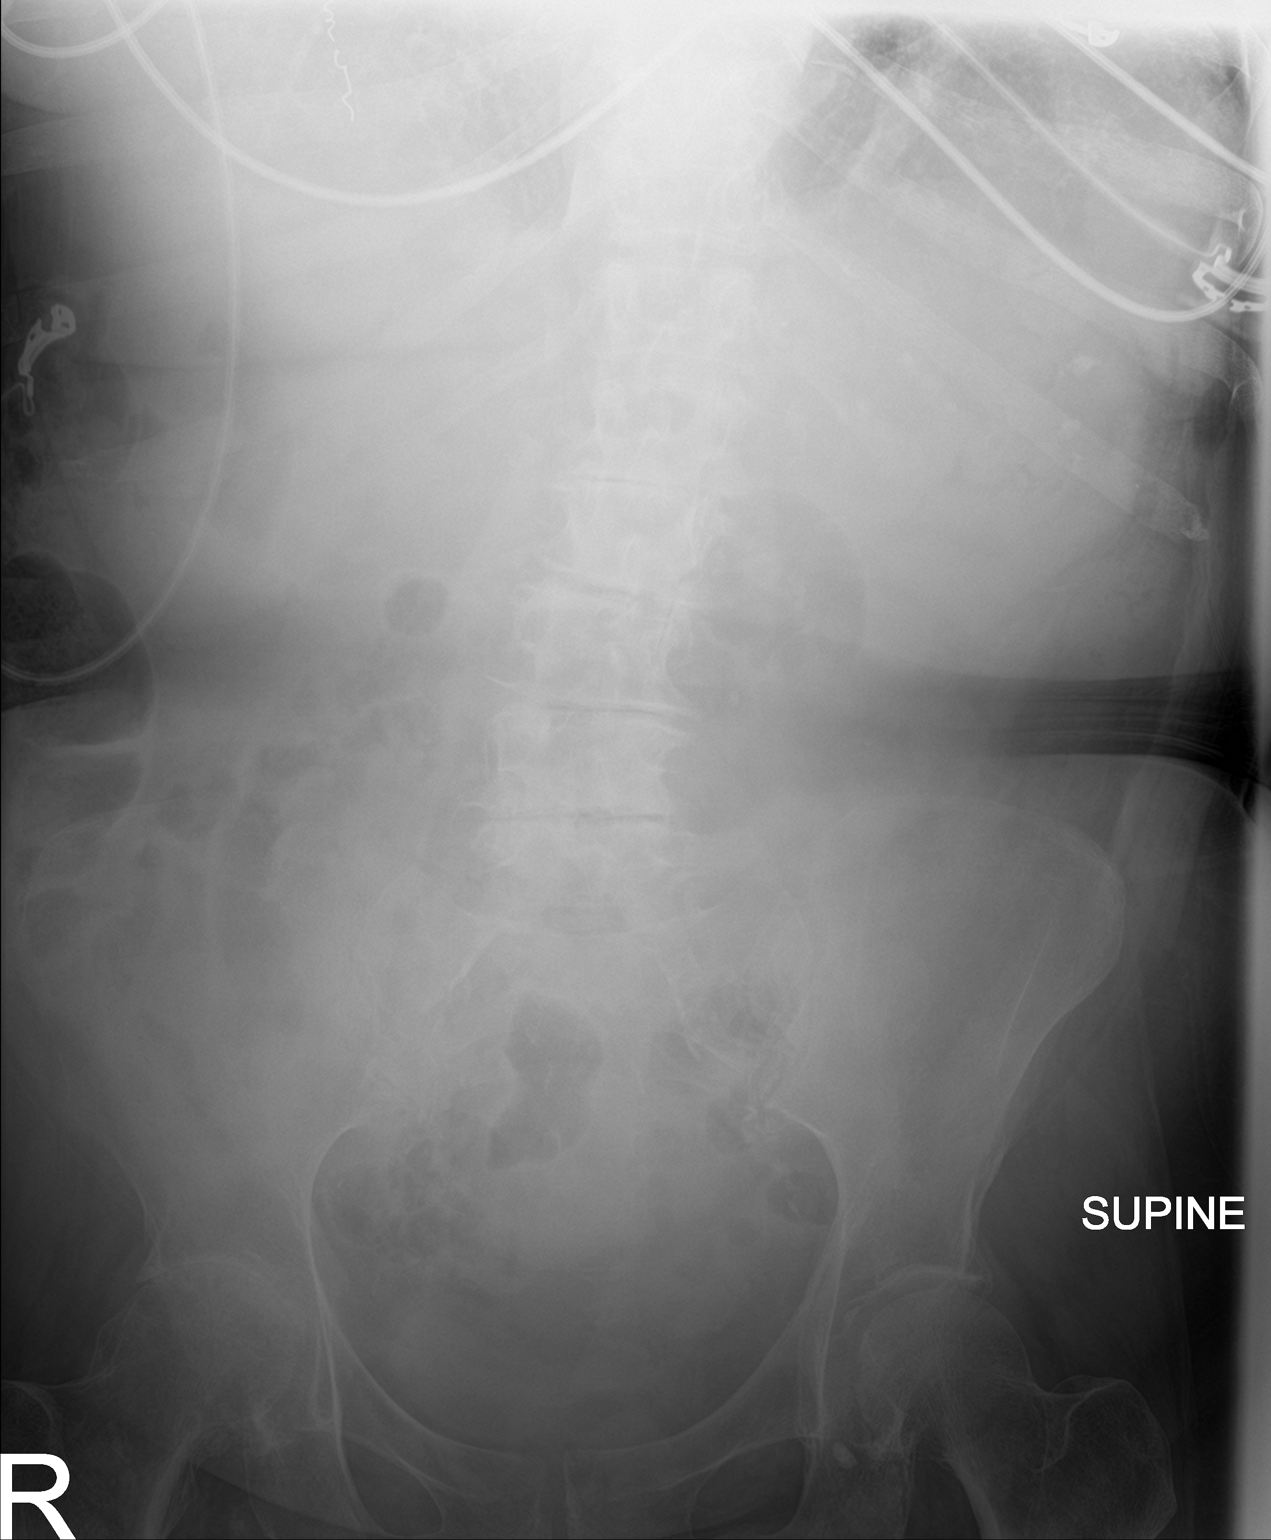

[abdomen decu]
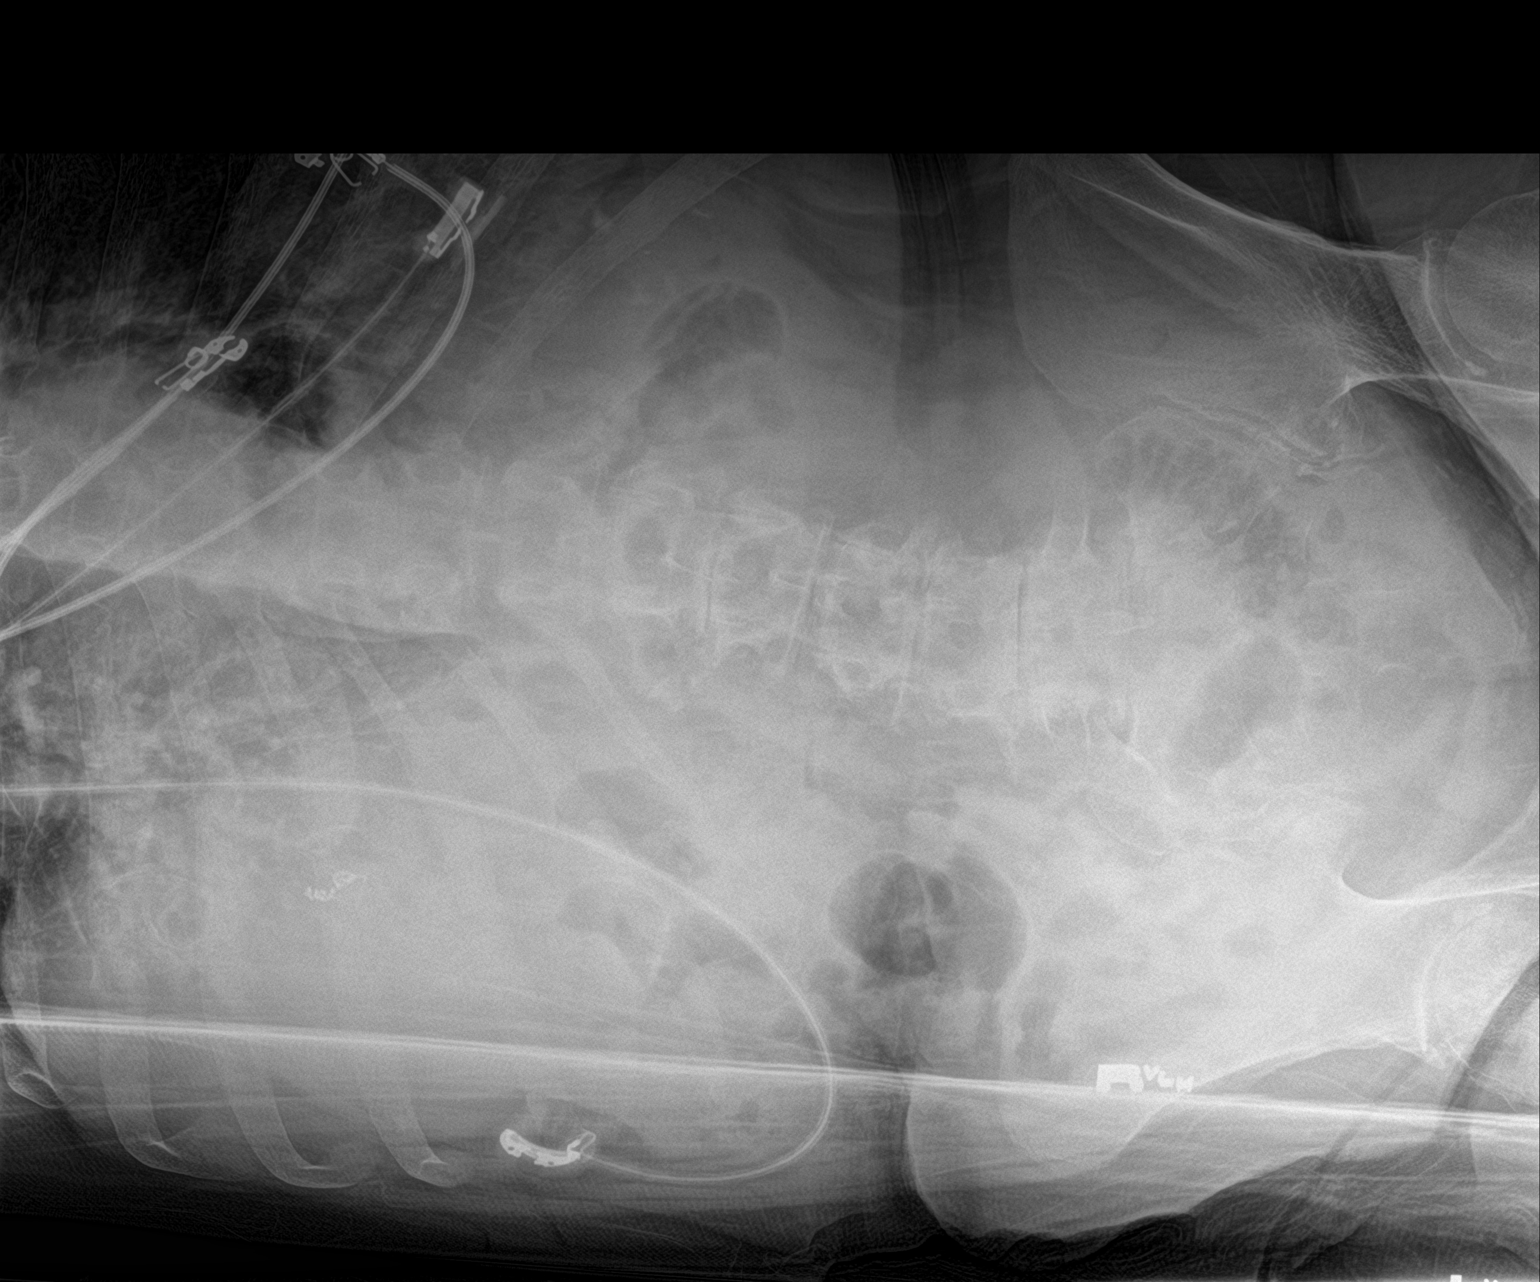

[3 of 3 positions shown; findings below may reference images not displayed]

FINDINGS: No evidence of bowel obstruction or free air. No organomegaly or
suspicious calcification. Coils are seen in the right upper quadrant
from prior arterial coiling. Degenerative changes in the lumbar
spine.
IMPRESSION: No acute findings.

## 2023-03-19 IMAGING — CT CT CHEST W/O CM
2 of 4 series · 11 of 36 positions shown, 13 images · non-contrast
Comparison: Radiographs earlier today. Abdominopelvic CT 10/26/2020

CLINICAL DATA: Abdominal distension; Abnormal xray - pleural
effusion acut resp failure

EXAM:
CT CHEST, ABDOMEN AND PELVIS WITHOUT CONTRAST
TECHNIQUE: Multidetector CT imaging of the chest, abdomen and pelvis was
performed following the standard protocol without IV contrast.

[Series 3: cap w/o · axial · non-contrast · 0.90mm/px · z∈[-761,-241]mm · 8 of 124 slices shown, 10 images]
[im 10/124  mediastinal]
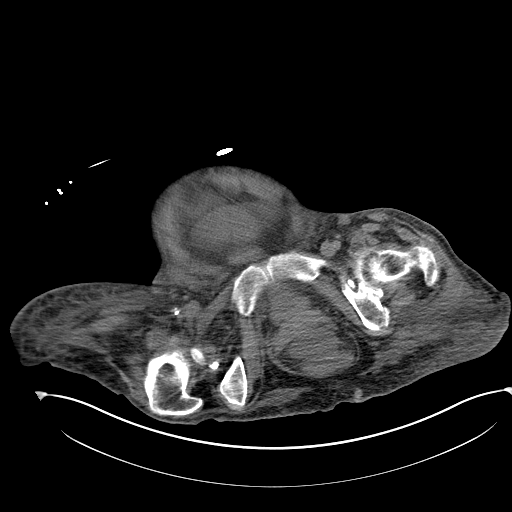
[im 10/124  lung]
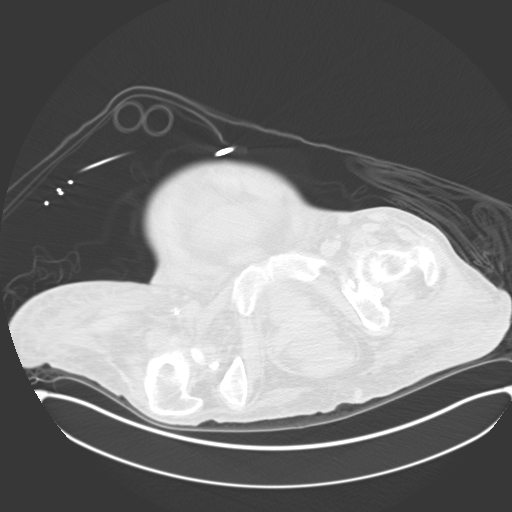
[im 29/124  lung]
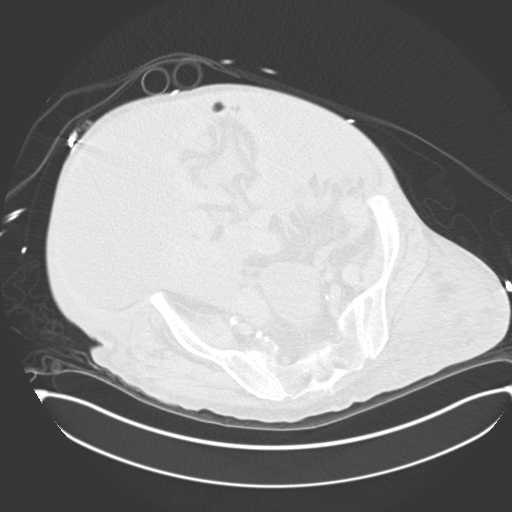
[im 38/124  lung]
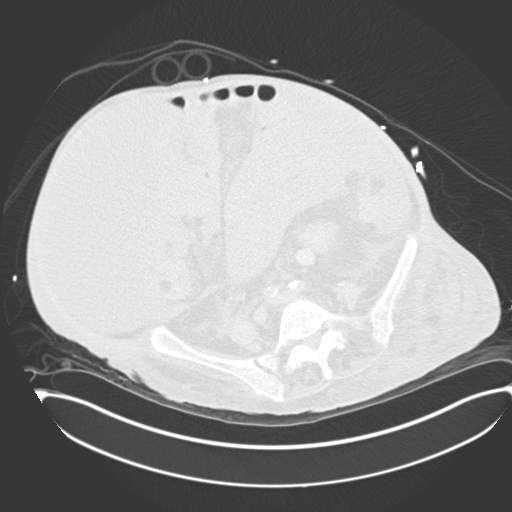
[im 57/124  lung]
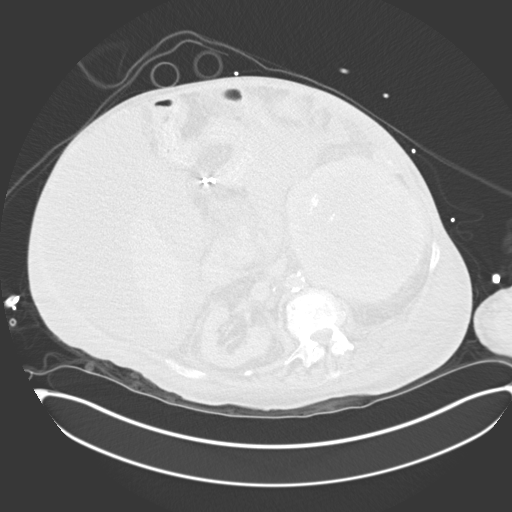
[im 67/124  mediastinal]
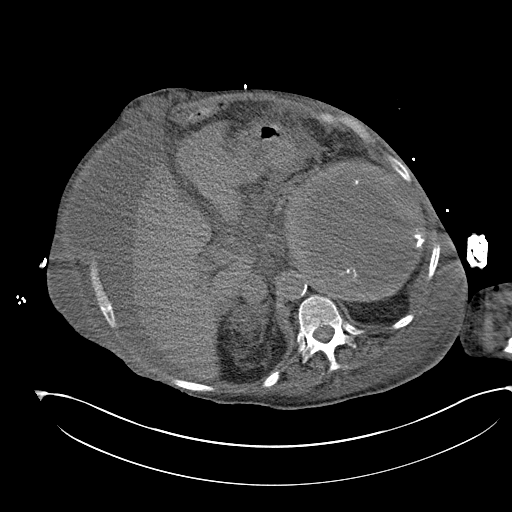
[im 67/124  lung]
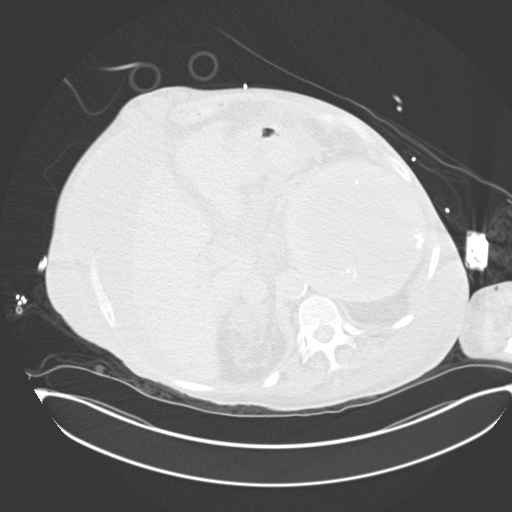
[im 86/124  lung]
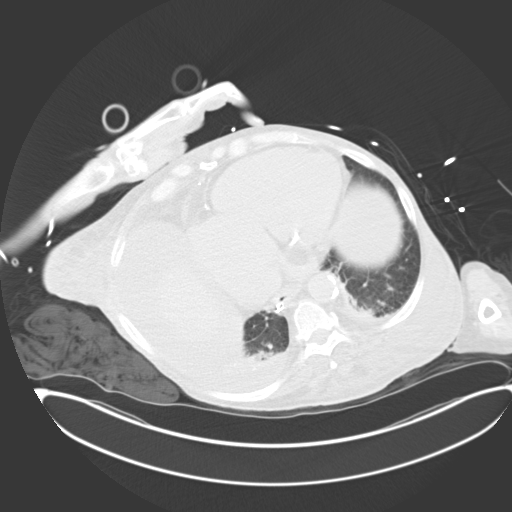
[im 95/124  lung]
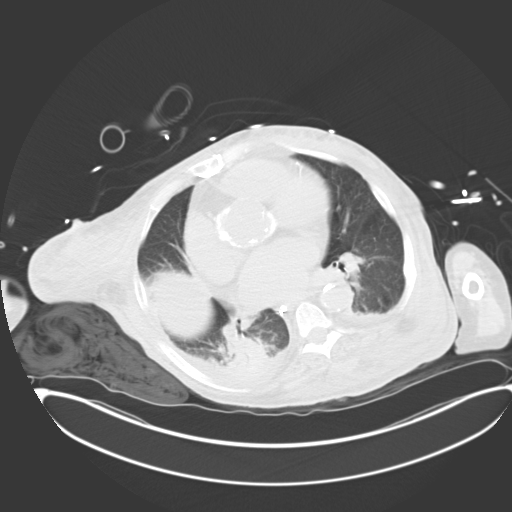
[im 114/124  lung]
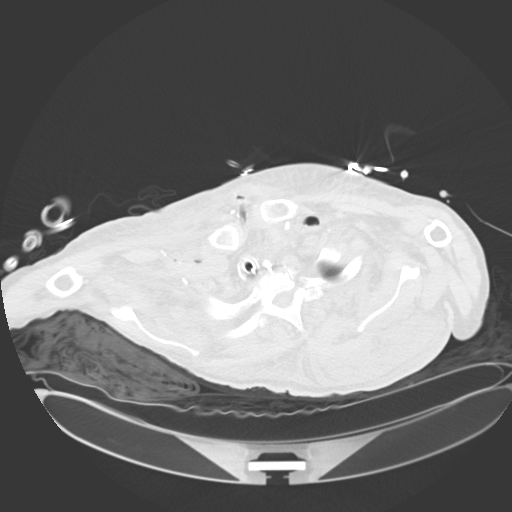

[Series 6: coronals · coronal · 0.81mm/px · 3 of 157 slices shown]
[im 32/157  lung]
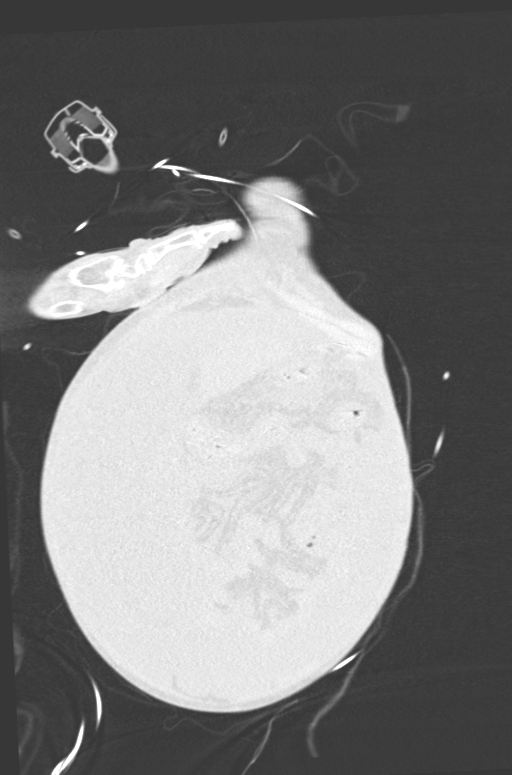
[im 63/157  lung]
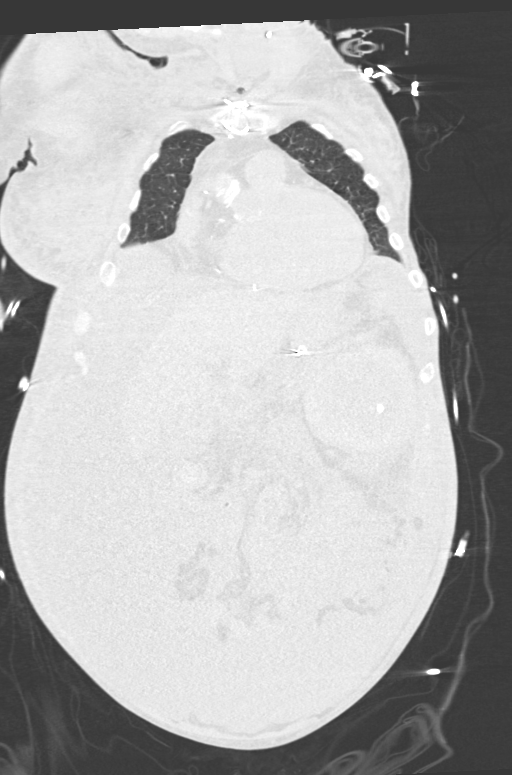
[im 94/157  lung]
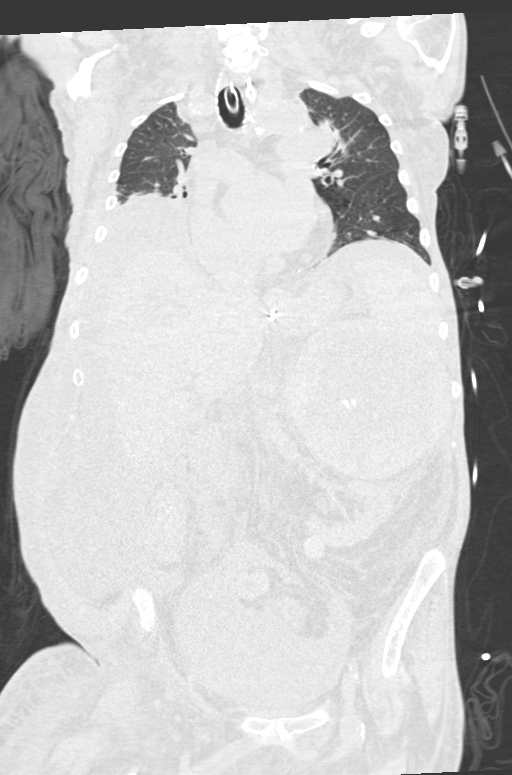

[11 of 36 positions shown; findings below may reference images not displayed]

FINDINGS: CT CHEST FINDINGS

Cardiovascular: Aortic atherosclerosis. There is aneurysmal
dilatation of the thoracic aorta just proximal to the great vessel
origin at 4.2 cm. Post median sternotomy. Multi chamber
cardiomegaly. Mitral annulus and coronary artery calcifications. No
pericardial effusion. There is a left internal jugular catheter with
tip in the brachiocephalic vein. Air within the intravascular
structures may be related to catheter placement. There is
generalized stranding involving the subcutaneous tissues of the left
neck.

Mediastinum/Nodes: The tip of the endotracheal tube is again noted
within the right mainstem bronchus, as seen on radiograph earlier
today. Enteric tube tip in the stomach. 12 mm right anterior
paratracheal node, series 505, image 17. Nodular heterogeneous
thyroid gland. Surgical clips in the soft tissues above the sternum
with associated soft tissue air. Surgical clips in the right axilla
adjacent to the axillary vasculature. Degenerative change in the
thoracic spine. No acute osseous abnormalities.

Lungs/Pleura: Small to moderate right pleural effusion. Rounded
masslike opacity in the right lower lobe central air bronchograms
measuring approximately 4.1 cm, series 507, image 79. Adjacent
ground-glass opacity and spiculations. This extends to the right
infrahilar region, difficult to delineate from adjacent structures
on this noncontrast exam right basilar consolidation adjacent to
pleural effusion. There is a trace left pleural effusion and basilar
atelectasis. Mild basilar septal thickening. No pneumothorax.

Musculoskeletal: Prior median sternotomy. No sternal fracture. No
acute osseous abnormalities are seen. There is generalized
subcutaneous edema which appears slightly confluent in the left
posterior soft tissues, right breast/chest wall, and left neck.
Scattered foci of air within the upper thorax appear to be
intravascular.

CT ABDOMEN PELVIS FINDINGS

Hepatobiliary: There is no obvious focal liver lesion on this
noncontrast exam, however evaluation is limited due to adjacent
ascites, lack of IV contrast, and arms down position. Gallbladder is
tentatively visualized, surrounded by ascites. No calcified
gallstone.

Pancreas: Pancreas appears elevated secondary to large adrenal mass.
Pancreatic atrophy. No obvious focal pancreatic abnormality on this
noncontrast exam.

Spleen: 16 mm central low-density lesion. This is unchanged. No
splenomegaly.

Adrenals/Urinary Tract: Large heterogeneous left adrenal mass with
internal calcifications measures 14 x 13.1 x 12.8 cm, minimally
increased in size from prior exam. The previous stranding about the
inferior aspect persists but has improved in the interim. Right
adrenal nodule is poorly defined on the current exam measuring
approximately 2.9 cm. This is also slightly increased in size. There
is bilateral renal parenchymal thinning. Low-density is well as
isodense lesions within both kidneys are again seen, including 18 mm
exophytic hyperdense lesion from the lower left kidney, series 505,
image 93, not significantly changed. Multiple isodense lesions
arising from the right kidney are grossly stable, but not well
assessed on this noncontrast exam. No hydronephrosis. Punctate
nonobstructing stone versus vascular calcification in the upper
right kidney. Urinary bladder appears completely empty and displaced
anteriorly.

Stomach/Bowel: Limited bowel assessment due to lack of contrast and
presence of large volume ascites. There is no evidence of bowel
obstruction. Equivocal wall thickening about the ascending as well
as splenic flexure of the colon. Enteric tube within the stomach
which is displaced by left adrenal mass.

Vascular/Lymphatic: Dense aortic and branch atherosclerosis. Limited
assessment for adenopathy on this exam due to presence of ascites
and lack contrast. Similar 11 mm soft tissue nodule anterior to the
left adrenal mass, series 505, image 52.

Reproductive: Cystic mass in the left pelvis measures approximately
9.2 x 6.9 cm, likely unchanged, difficult to delineate from adjacent
ascites. Atrophic uterus.

Other: Large volume abdominopelvic ascites which is increased from
prior exam. Ascites measures simple fluid density without internal
complexity. Ascites tracks into a umbilical hernia which tracks so
contains fat. There is generalized subcutaneous edema, which is
confluent in the right flank. Generalized atrophy of subcutaneous
fat suggest cachexia.

Musculoskeletal: Degenerative change in the lumbar spine with Modic
endplate changes at L4-L5. No evidence of focal bone lesion. The
bones are under mineralized. Moderate right hip osteoarthritis.
IMPRESSION: 1. The tip of the endotracheal tube is again noted within the right
mainstem bronchus, as seen on radiograph earlier today, this was
previously Ng called to the referring clinician.
2. Rounded masslike opacity in the right lower lobe with central air
bronchograms measuring approximately 4.1 cm. Adjacent ground-glass
opacity and spiculations. Differential considerations include
pneumonia versus malignancy.
3. Small to moderate right pleural effusion. Trace left pleural
effusion and basilar atelectasis.
4. Multi chamber cardiomegaly. Aortic atherosclerosis. Aneurysmal
dilatation of the thoracic aorta just prior to the great vessel
origins.
5. Large volume abdominopelvic ascites which has increased from
prior exam. Generalized subcutaneous edema. Scattered intravenous
air in the chest and right upper extremity is likely related to
venous access.
6. Large heterogeneous left adrenal mass with slight increase size
from October 2020. Right adrenal mass is grossly stable.
7. Cystic mass in the left pelvis measures 9.2 x 6.9 cm, likely
unchanged, difficult to delineate from adjacent ascites.
8. Equivocal wall thickening about the ascending as well as splenic
flexure of the colon, may be reactive due to ascites. Recommend
correlation for symptoms of colitis.
9. Additional chronic findings are stable as described.

Aortic Atherosclerosis ([id number]-0O0.0).
